# Patient Record
Sex: Female | Born: 1945 | Race: White | Hispanic: No | Smoking: Former smoker
Health system: Southern US, Community
[De-identification: ages and names within clinical notes are randomized; demographics above are authoritative.]

## PROBLEM LIST (undated history)

## (undated) DIAGNOSIS — R351 Nocturia: Secondary | ICD-10-CM

## (undated) DIAGNOSIS — I83893 Varicose veins of bilateral lower extremities with other complications: Secondary | ICD-10-CM

## (undated) DIAGNOSIS — K5900 Constipation, unspecified: Secondary | ICD-10-CM

## (undated) DIAGNOSIS — Z860100 Personal history of colon polyps, unspecified: Secondary | ICD-10-CM

## (undated) DIAGNOSIS — J189 Pneumonia, unspecified organism: Secondary | ICD-10-CM

## (undated) DIAGNOSIS — E119 Type 2 diabetes mellitus without complications: Secondary | ICD-10-CM

## (undated) DIAGNOSIS — J310 Chronic rhinitis: Secondary | ICD-10-CM

## (undated) DIAGNOSIS — Z79890 Hormone replacement therapy (postmenopausal): Secondary | ICD-10-CM

## (undated) DIAGNOSIS — C651 Malignant neoplasm of right renal pelvis: Secondary | ICD-10-CM

## (undated) DIAGNOSIS — D6481 Anemia due to antineoplastic chemotherapy: Secondary | ICD-10-CM

## (undated) DIAGNOSIS — D62 Acute posthemorrhagic anemia: Secondary | ICD-10-CM

## (undated) DIAGNOSIS — Z9889 Other specified postprocedural states: Secondary | ICD-10-CM

## (undated) DIAGNOSIS — M2550 Pain in unspecified joint: Secondary | ICD-10-CM

## (undated) DIAGNOSIS — R600 Localized edema: Secondary | ICD-10-CM

## (undated) DIAGNOSIS — M169 Osteoarthritis of hip, unspecified: Secondary | ICD-10-CM

## (undated) DIAGNOSIS — R0600 Dyspnea, unspecified: Secondary | ICD-10-CM

## (undated) DIAGNOSIS — D72829 Elevated white blood cell count, unspecified: Secondary | ICD-10-CM

## (undated) DIAGNOSIS — R3915 Urgency of urination: Secondary | ICD-10-CM

## (undated) DIAGNOSIS — C801 Malignant (primary) neoplasm, unspecified: Secondary | ICD-10-CM

## (undated) DIAGNOSIS — Z85828 Personal history of other malignant neoplasm of skin: Secondary | ICD-10-CM

## (undated) DIAGNOSIS — R531 Weakness: Secondary | ICD-10-CM

## (undated) DIAGNOSIS — K219 Gastro-esophageal reflux disease without esophagitis: Secondary | ICD-10-CM

## (undated) DIAGNOSIS — E785 Hyperlipidemia, unspecified: Secondary | ICD-10-CM

## (undated) DIAGNOSIS — M549 Dorsalgia, unspecified: Secondary | ICD-10-CM

## (undated) DIAGNOSIS — T8859XA Other complications of anesthesia, initial encounter: Secondary | ICD-10-CM

## (undated) DIAGNOSIS — Z972 Presence of dental prosthetic device (complete) (partial): Secondary | ICD-10-CM

## (undated) DIAGNOSIS — Z9221 Personal history of antineoplastic chemotherapy: Secondary | ICD-10-CM

## (undated) DIAGNOSIS — E039 Hypothyroidism, unspecified: Secondary | ICD-10-CM

## (undated) DIAGNOSIS — Z95828 Presence of other vascular implants and grafts: Secondary | ICD-10-CM

## (undated) DIAGNOSIS — M791 Myalgia, unspecified site: Secondary | ICD-10-CM

## (undated) DIAGNOSIS — T451X5A Adverse effect of antineoplastic and immunosuppressive drugs, initial encounter: Secondary | ICD-10-CM

## (undated) DIAGNOSIS — Z91018 Allergy to other foods: Secondary | ICD-10-CM

## (undated) DIAGNOSIS — I10 Essential (primary) hypertension: Secondary | ICD-10-CM

## (undated) DIAGNOSIS — T466X5A Adverse effect of antihyperlipidemic and antiarteriosclerotic drugs, initial encounter: Secondary | ICD-10-CM

## (undated) DIAGNOSIS — K635 Polyp of colon: Secondary | ICD-10-CM

## (undated) DIAGNOSIS — R7303 Prediabetes: Secondary | ICD-10-CM

## (undated) DIAGNOSIS — R1114 Bilious vomiting: Secondary | ICD-10-CM

## (undated) DIAGNOSIS — Z923 Personal history of irradiation: Secondary | ICD-10-CM

## (undated) DIAGNOSIS — M1990 Unspecified osteoarthritis, unspecified site: Secondary | ICD-10-CM

## (undated) DIAGNOSIS — C3411 Malignant neoplasm of upper lobe, right bronchus or lung: Secondary | ICD-10-CM

## (undated) DIAGNOSIS — R112 Nausea with vomiting, unspecified: Secondary | ICD-10-CM

## (undated) HISTORY — PX: COLONOSCOPY: SHX174

## (undated) HISTORY — PX: UMBILICAL HERNIA REPAIR: SHX196

## (undated) HISTORY — PX: HERNIA REPAIR: SHX51

## (undated) HISTORY — DX: Polyp of colon: K63.5

## (undated) HISTORY — DX: Bilious vomiting: R11.14

## (undated) HISTORY — DX: Localized edema: R60.0

## (undated) HISTORY — DX: Hyperlipidemia, unspecified: E78.5

## (undated) HISTORY — DX: Adverse effect of antihyperlipidemic and antiarteriosclerotic drugs, initial encounter: T46.6X5A

## (undated) HISTORY — DX: Myalgia, unspecified site: M79.10

## (undated) HISTORY — DX: Osteoarthritis of hip, unspecified: M16.9

## (undated) HISTORY — DX: Elevated white blood cell count, unspecified: D72.829

## (undated) HISTORY — DX: Dorsalgia, unspecified: M54.9

## (undated) HISTORY — DX: Allergy to other foods: Z91.018

## (undated) HISTORY — DX: Pain in unspecified joint: M25.50

## (undated) HISTORY — PX: THYROID LOBECTOMY: SHX420

## (undated) HISTORY — DX: Constipation, unspecified: K59.00

## (undated) HISTORY — DX: Prediabetes: R73.03

## (undated) HISTORY — DX: Acute posthemorrhagic anemia: D62

## (undated) HISTORY — DX: Hormone replacement therapy: Z79.890

---

## 1977-08-23 DIAGNOSIS — E890 Postprocedural hypothyroidism: Secondary | ICD-10-CM

## 1977-08-23 HISTORY — DX: Postprocedural hypothyroidism: E89.0

## 1977-08-23 HISTORY — PX: THYROID LOBECTOMY: SHX420

## 1985-08-23 HISTORY — PX: EYE SURGERY: SHX253

## 1999-03-11 ENCOUNTER — Other Ambulatory Visit: Admission: RE | Admit: 1999-03-11 | Discharge: 1999-03-11 | Payer: Self-pay | Admitting: Family Medicine

## 2000-05-19 ENCOUNTER — Encounter: Payer: Self-pay | Admitting: Family Medicine

## 2000-05-19 ENCOUNTER — Encounter: Admission: RE | Admit: 2000-05-19 | Discharge: 2000-05-19 | Payer: Self-pay | Admitting: Family Medicine

## 2000-05-26 ENCOUNTER — Other Ambulatory Visit: Admission: RE | Admit: 2000-05-26 | Discharge: 2000-05-26 | Payer: Self-pay | Admitting: Family Medicine

## 2001-12-28 ENCOUNTER — Other Ambulatory Visit: Admission: RE | Admit: 2001-12-28 | Discharge: 2001-12-28 | Payer: Self-pay | Admitting: Family Medicine

## 2003-01-02 ENCOUNTER — Encounter: Payer: Self-pay | Admitting: Family Medicine

## 2003-01-02 ENCOUNTER — Encounter: Admission: RE | Admit: 2003-01-02 | Discharge: 2003-01-02 | Payer: Self-pay | Admitting: Family Medicine

## 2003-01-09 ENCOUNTER — Other Ambulatory Visit: Admission: RE | Admit: 2003-01-09 | Discharge: 2003-01-09 | Payer: Self-pay | Admitting: Family Medicine

## 2004-01-08 ENCOUNTER — Encounter: Admission: RE | Admit: 2004-01-08 | Discharge: 2004-01-08 | Payer: Self-pay | Admitting: Family Medicine

## 2004-01-17 ENCOUNTER — Other Ambulatory Visit: Admission: RE | Admit: 2004-01-17 | Discharge: 2004-01-17 | Payer: Self-pay | Admitting: Family Medicine

## 2004-01-27 ENCOUNTER — Encounter: Admission: RE | Admit: 2004-01-27 | Discharge: 2004-01-27 | Payer: Self-pay | Admitting: Family Medicine

## 2005-12-03 ENCOUNTER — Encounter: Admission: RE | Admit: 2005-12-03 | Discharge: 2005-12-03 | Payer: Self-pay | Admitting: Family Medicine

## 2005-12-22 ENCOUNTER — Other Ambulatory Visit: Admission: RE | Admit: 2005-12-22 | Discharge: 2005-12-22 | Payer: Self-pay | Admitting: Family Medicine

## 2006-08-01 ENCOUNTER — Encounter: Admission: RE | Admit: 2006-08-01 | Discharge: 2006-10-30 | Payer: Self-pay | Admitting: Family Medicine

## 2007-03-02 ENCOUNTER — Encounter: Admission: RE | Admit: 2007-03-02 | Discharge: 2007-03-02 | Payer: Self-pay | Admitting: Family Medicine

## 2008-03-04 ENCOUNTER — Encounter: Admission: RE | Admit: 2008-03-04 | Discharge: 2008-03-04 | Payer: Self-pay | Admitting: Family Medicine

## 2008-04-17 ENCOUNTER — Encounter: Admission: RE | Admit: 2008-04-17 | Discharge: 2008-04-17 | Payer: Self-pay | Admitting: Family Medicine

## 2008-05-22 ENCOUNTER — Ambulatory Visit: Payer: Self-pay | Admitting: Vascular Surgery

## 2008-12-28 LAB — HM HEPATITIS C SCREENING LAB: HM Hepatitis Screen: NEGATIVE

## 2009-04-22 ENCOUNTER — Encounter: Admission: RE | Admit: 2009-04-22 | Discharge: 2009-04-22 | Payer: Self-pay | Admitting: Family Medicine

## 2009-05-28 ENCOUNTER — Encounter: Admission: RE | Admit: 2009-05-28 | Discharge: 2009-05-28 | Payer: Self-pay | Admitting: Family Medicine

## 2009-12-16 ENCOUNTER — Telehealth: Payer: Self-pay | Admitting: Gastroenterology

## 2010-06-22 ENCOUNTER — Encounter: Admission: RE | Admit: 2010-06-22 | Discharge: 2010-06-22 | Payer: Self-pay | Admitting: Family Medicine

## 2010-09-13 ENCOUNTER — Encounter: Payer: Self-pay | Admitting: Family Medicine

## 2010-09-22 NOTE — Progress Notes (Signed)
Summary: Schedule recall colonoscopy  Phone Note Outgoing Call Call back at Home Phone [phone]   Call placed by: Christie Nottingham CMA Duncan Dull),  December 16, 2009 11:07 AM Call placed to: Patient Summary of Call: Called pt to schedule recall colonoscopy. Pt states she has the letter on her frig and has been meaning to call and schedule the colonoscopy. She states when everything calms down she will call back and schedule in the fall time. Informed pt of the importance of having recall colonoscopy due to her family history of colon cancer and her prior colon polyps. Pt agreed. Initial call taken by: Christie Nottingham CMA Duncan Dull),  December 16, 2009 11:10 AM

## 2010-11-11 ENCOUNTER — Other Ambulatory Visit (HOSPITAL_COMMUNITY): Payer: Self-pay | Admitting: Obstetrics and Gynecology

## 2011-01-05 NOTE — Letter (Signed)
May 22, 2008   Talmadge Coventry, M.D.  [address]  [place], [place] [address]   Re:  KAWANA, HEGEL                DOB:  [birth year]   Dear Rise Mu:   Thanks for asking me to see this patient for evaluation of her lower  extremity venous pathology.  As you know, she is a very pleasant 65-year-  old white female with a long history of progressive venous varicosities.  These are much more severe on her left leg than on her right.  She does  not have any history of deep vein thrombosis and no history of bleeding.  She does have progressive changes of aching and itchiness over her  medial left calf and also the sensation of heaviness.  She did try  graduated compression stockings several years ago, but has not tried  these recently.  She does have a maternal history of venous  varicosities.  She does have slightly elevated blood sugar but is not  diabetic and is on no treatment other than diet.  She also has a strong  family history of premature atherosclerotic disease with her father  suffering amputation secondary to vascular disease.   PAST MEDICAL HISTORY:  Significant only for elevated cholesterol.   SOCIAL HISTORY:  She is married.  She does smoke 1/2 pack of cigarettes  per day.  She does not drink alcohol on a regular basis.   REVIEW OF SYSTEMS:  Her weight is reported at 190 pounds.  She is 5 feet  6 inches tall.   MEDICATIONS:  Hydrochlorothiazide, Premarin, Synthroid, simvastatin, and  aspirin.   PHYSICAL EXAM:  Well-developed, well-nourished white female appearing  stated age of 65.  Blood pressure is 147/79, pulse 75, respirations 18.  Dorsalis pedis pulses are 2+ bilaterally.  She does have scattered  spider vein telangiectasia over her ankles bilaterally.  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  These are to a  much lesser degree on her right calf.   She underwent screening venous duplex by me, and this shows gross  reflux  in the left great saphenous vein feeding into these tributary  varicosities.  She does have a slightly dilated right saphenous vein and  I am not able to demonstrate this level of reflux on the right leg.  I  discussed options with the patient.  I explained that this should not  put her at any increased risk for deep vein thrombosis and explained  that this would not cause her any increased risk for limb loss.  I  explained that with her normal pedal pulses, that she does not  demonstrate any evidence of arterial insufficiency.  I have explained  that we hopefully can achieve improvement in her symptoms with  compression garments.  We have fitted her with thigh-high 20-30 mmHg  pressure garments and instructed her on the use of these.  She will  continue her ibuprofen 600 mg q.4 to 6 hours as needed for discomfort  and continue with her elevation.  She is currently in a weight loss  program and has lost 33 pounds to date.  We will see her again in 3  months for repeat evaluation to determine if her conservative therapy is  helping her.  I did discuss options of laser ablation and stab  phlebectomy for relief of her symptoms should the conservative methods  fail.  We will see  her again in 3 months and will proceed with a formal  venous duplex at that time as well.   Larina Earthly, M.D.  Electronically Signed   TFE/MEDQ  D:  05/22/2008  T:  05/23/2008  Job:  [id number]

## 2011-05-17 ENCOUNTER — Other Ambulatory Visit: Payer: Self-pay | Admitting: Obstetrics and Gynecology

## 2011-05-17 DIAGNOSIS — Z1231 Encounter for screening mammogram for malignant neoplasm of breast: Secondary | ICD-10-CM

## 2011-06-04 ENCOUNTER — Ambulatory Visit
Admission: RE | Admit: 2011-06-04 | Discharge: 2011-06-04 | Disposition: A | Discharge status: Home / Self Care | Payer: Federal, State, Local not specified - PPO | Source: Ambulatory Visit | Attending: Obstetrics and Gynecology | Admitting: Obstetrics and Gynecology

## 2011-06-04 DIAGNOSIS — Z1231 Encounter for screening mammogram for malignant neoplasm of breast: Secondary | ICD-10-CM

## 2014-06-05 ENCOUNTER — Telehealth: Payer: Self-pay | Admitting: Cardiovascular Disease

## 2014-06-05 NOTE — Telephone Encounter (Signed)
05/31/14 Received 7 pages of records from [place] for appointment with Dr Claiborne Billings 07/15/14.  Records give to [facility] in Medical Records for Dr Ermalinda Memos schedule of 07/15/14  lp

## 2014-07-15 ENCOUNTER — Ambulatory Visit: Payer: Federal, State, Local not specified - PPO | Admitting: Cardiovascular Disease

## 2014-07-23 ENCOUNTER — Ambulatory Visit: Payer: Federal, State, Local not specified - PPO | Admitting: Interventional Cardiology

## 2015-02-06 ENCOUNTER — Other Ambulatory Visit: Payer: Self-pay | Admitting: Oncology

## 2015-06-17 DIAGNOSIS — M159 Polyosteoarthritis, unspecified: Secondary | ICD-10-CM | POA: Insufficient documentation

## 2015-06-17 DIAGNOSIS — M47816 Spondylosis without myelopathy or radiculopathy, lumbar region: Secondary | ICD-10-CM | POA: Insufficient documentation

## 2015-06-17 DIAGNOSIS — K219 Gastro-esophageal reflux disease without esophagitis: Secondary | ICD-10-CM | POA: Insufficient documentation

## 2015-06-17 DIAGNOSIS — Z79890 Hormone replacement therapy (postmenopausal): Secondary | ICD-10-CM

## 2015-06-17 DIAGNOSIS — Z789 Other specified health status: Secondary | ICD-10-CM | POA: Insufficient documentation

## 2015-06-17 DIAGNOSIS — I10 Essential (primary) hypertension: Secondary | ICD-10-CM | POA: Insufficient documentation

## 2015-06-17 DIAGNOSIS — M8949 Other hypertrophic osteoarthropathy, multiple sites: Secondary | ICD-10-CM | POA: Insufficient documentation

## 2015-06-17 DIAGNOSIS — M150 Primary generalized (osteo)arthritis: Secondary | ICD-10-CM

## 2015-06-17 DIAGNOSIS — E782 Mixed hyperlipidemia: Secondary | ICD-10-CM | POA: Insufficient documentation

## 2015-06-17 DIAGNOSIS — E890 Postprocedural hypothyroidism: Secondary | ICD-10-CM | POA: Insufficient documentation

## 2015-06-17 HISTORY — DX: Hormone replacement therapy: Z79.890

## 2015-06-17 HISTORY — DX: Gastro-esophageal reflux disease without esophagitis: K21.9

## 2015-07-21 DIAGNOSIS — Z96641 Presence of right artificial hip joint: Secondary | ICD-10-CM | POA: Insufficient documentation

## 2015-07-28 DIAGNOSIS — R319 Hematuria, unspecified: Secondary | ICD-10-CM | POA: Insufficient documentation

## 2015-09-23 LAB — HEMOGLOBIN A1C: Hemoglobin A1C: 6.1

## 2015-10-24 ENCOUNTER — Other Ambulatory Visit: Payer: Self-pay

## 2015-10-24 DIAGNOSIS — Z1231 Encounter for screening mammogram for malignant neoplasm of breast: Secondary | ICD-10-CM

## 2015-10-27 ENCOUNTER — Encounter: Payer: Self-pay | Admitting: Gastroenterology

## 2015-11-11 ENCOUNTER — Encounter (HOSPITAL_COMMUNITY): Payer: Self-pay | Admitting: Emergency Medicine

## 2015-11-11 ENCOUNTER — Emergency Department (HOSPITAL_COMMUNITY): Payer: Medicare HMO

## 2015-11-11 ENCOUNTER — Emergency Department (HOSPITAL_COMMUNITY)
Admission: EM | Admit: 2015-11-11 | Discharge: 2015-11-11 | Disposition: A | Discharge status: Home / Self Care | Payer: Medicare HMO | Attending: Emergency Medicine | Admitting: Emergency Medicine

## 2015-11-11 DIAGNOSIS — F17210 Nicotine dependence, cigarettes, uncomplicated: Secondary | ICD-10-CM | POA: Insufficient documentation

## 2015-11-11 DIAGNOSIS — Z79899 Other long term (current) drug therapy: Secondary | ICD-10-CM | POA: Diagnosis not present

## 2015-11-11 DIAGNOSIS — N3001 Acute cystitis with hematuria: Secondary | ICD-10-CM

## 2015-11-11 DIAGNOSIS — D72829 Elevated white blood cell count, unspecified: Secondary | ICD-10-CM | POA: Diagnosis not present

## 2015-11-11 DIAGNOSIS — E119 Type 2 diabetes mellitus without complications: Secondary | ICD-10-CM | POA: Diagnosis not present

## 2015-11-11 DIAGNOSIS — E039 Hypothyroidism, unspecified: Secondary | ICD-10-CM | POA: Diagnosis not present

## 2015-11-11 DIAGNOSIS — Z7982 Long term (current) use of aspirin: Secondary | ICD-10-CM | POA: Diagnosis not present

## 2015-11-11 DIAGNOSIS — R946 Abnormal results of thyroid function studies: Secondary | ICD-10-CM | POA: Diagnosis not present

## 2015-11-11 DIAGNOSIS — R55 Syncope and collapse: Secondary | ICD-10-CM | POA: Diagnosis not present

## 2015-11-11 DIAGNOSIS — Z91040 Latex allergy status: Secondary | ICD-10-CM | POA: Insufficient documentation

## 2015-11-11 DIAGNOSIS — R7989 Other specified abnormal findings of blood chemistry: Secondary | ICD-10-CM

## 2015-11-11 DIAGNOSIS — R42 Dizziness and giddiness: Secondary | ICD-10-CM | POA: Insufficient documentation

## 2015-11-11 DIAGNOSIS — R531 Weakness: Secondary | ICD-10-CM | POA: Insufficient documentation

## 2015-11-11 DIAGNOSIS — I10 Essential (primary) hypertension: Secondary | ICD-10-CM | POA: Diagnosis not present

## 2015-11-11 HISTORY — DX: Hypothyroidism, unspecified: E03.9

## 2015-11-11 HISTORY — DX: Essential (primary) hypertension: I10

## 2015-11-11 LAB — I-STAT TROPONIN, ED
Troponin i, poc: 0 ng/mL (ref 0.00–0.08)
Troponin i, poc: 0 ng/mL (ref 0.00–0.08)

## 2015-11-11 LAB — URINALYSIS, ROUTINE W REFLEX MICROSCOPIC
Bilirubin Urine: NEGATIVE
Glucose, UA: NEGATIVE mg/dL
Ketones, ur: NEGATIVE mg/dL
Nitrite: NEGATIVE
Protein, ur: NEGATIVE mg/dL
Specific Gravity, Urine: 1.018 (ref 1.005–1.030)
pH: 6.5 (ref 5.0–8.0)

## 2015-11-11 LAB — COMPREHENSIVE METABOLIC PANEL
ALT: 22 U/L (ref 14–54)
AST: 20 U/L (ref 15–41)
Albumin: 3.6 g/dL (ref 3.5–5.0)
Alkaline Phosphatase: 71 U/L (ref 38–126)
Anion gap: 12 (ref 5–15)
BUN: 28 mg/dL — ABNORMAL HIGH (ref 6–20)
CO2: 28 mmol/L (ref 22–32)
Calcium: 9.5 mg/dL (ref 8.9–10.3)
Chloride: 102 mmol/L (ref 101–111)
Creatinine, Ser: 1.52 mg/dL — ABNORMAL HIGH (ref 0.44–1.00)
GFR calc Af Amer: 40 mL/min — ABNORMAL LOW (ref >60)
GFR calc non Af Amer: 34 mL/min — ABNORMAL LOW (ref >60)
Glucose, Bld: 105 mg/dL — ABNORMAL HIGH (ref 65–99)
Potassium: 4 mmol/L (ref 3.5–5.1)
Sodium: 142 mmol/L (ref 135–145)
Total Bilirubin: 0.7 mg/dL (ref 0.3–1.2)
Total Protein: 6.6 g/dL (ref 6.5–8.1)

## 2015-11-11 LAB — CBC WITH DIFFERENTIAL/PLATELET
Basophils Absolute: 0 10*3/uL (ref 0.0–0.1)
Basophils Relative: 0 %
Eosinophils Absolute: 0.1 10*3/uL (ref 0.0–0.7)
Eosinophils Relative: 1 %
HCT: 38.9 % (ref 36.0–46.0)
Hemoglobin: 12.7 g/dL (ref 12.0–15.0)
Lymphocytes Relative: 12 %
Lymphs Abs: 1.6 10*3/uL (ref 0.7–4.0)
MCH: 31.1 pg (ref 26.0–34.0)
MCHC: 32.6 g/dL (ref 30.0–36.0)
MCV: 95.1 fL (ref 78.0–100.0)
Monocytes Absolute: 0.9 10*3/uL (ref 0.1–1.0)
Monocytes Relative: 6 %
Neutro Abs: 11 10*3/uL — ABNORMAL HIGH (ref 1.7–7.7)
Neutrophils Relative %: 81 %
Platelets: 235 10*3/uL (ref 150–400)
RBC: 4.09 MIL/uL (ref 3.87–5.11)
RDW: 13 % (ref 11.5–15.5)
WBC: 13.6 10*3/uL — ABNORMAL HIGH (ref 4.0–10.5)

## 2015-11-11 LAB — URINE MICROSCOPIC-ADD ON

## 2015-11-11 LAB — TSH: TSH: 0.215 u[IU]/mL — ABNORMAL LOW (ref 0.350–4.500)

## 2015-11-11 LAB — T4, FREE: Free T4: 1.23 ng/dL — ABNORMAL HIGH (ref 0.61–1.12)

## 2015-11-11 LAB — PROTIME-INR
INR: 0.97 (ref 0.00–1.49)
Prothrombin Time: 13.1 seconds (ref 11.6–15.2)

## 2015-11-11 LAB — I-STAT CG4 LACTIC ACID, ED: Lactic Acid, Venous: 1.11 mmol/L (ref 0.5–2.0)

## 2015-11-11 LAB — APTT: aPTT: 24 seconds (ref 24–37)

## 2015-11-11 MED ORDER — CEPHALEXIN 500 MG PO CAPS: 500.0000 mg | ORAL_CAPSULE | Freq: Four times a day (QID) | ORAL | Status: DC

## 2015-11-11 MED ORDER — SODIUM CHLORIDE 0.9 % IV BOLUS (SEPSIS)
1000.0000 mL | Freq: Once | INTRAVENOUS | Status: AC
  Administered 2015-11-11: 1000 mL via INTRAVENOUS

## 2015-11-11 MED ORDER — PROMETHAZINE HCL 25 MG/ML IJ SOLN
12.5000 mg | Freq: Once | INTRAMUSCULAR | Status: AC
  Administered 2015-11-11: 12.5 mg via INTRAVENOUS
  Filled 2015-11-11 (×2): qty 1

## 2015-11-11 MED ORDER — ONDANSETRON HCL 4 MG/2ML IJ SOLN
4.0000 mg | Freq: Once | INTRAMUSCULAR | Status: AC
  Administered 2015-11-11: 4 mg via INTRAVENOUS
  Filled 2015-11-11: qty 2

## 2015-11-11 NOTE — ED Notes (Signed)
Pt able to ambulate to the bathroom w/ minimal assistance.  Informed provider

## 2015-11-11 NOTE — ED Provider Notes (Signed)
Maria Marquez is a 70 y.o. female, with a history of HTN and hypothyroidism, presenting to the ED with a syncopal episode. Patient currently complains of lightheadedness and nausea. Patient previously had a headache that has since resolved. Patient denies pain. Patient denies recent illness, fever/chills, vomiting, chest pain, shortness of breath, abdominal pain, or any other complaints. Patient is accompanied by her daughter and her daughter's spouse at the bedside.  HPI from Eliezer Mccoy, PA-C: "Patient is a 70yo F with a PMHx of HTN and hypothyroidism who presents today with a syncopal episode. The episode was not witnessed. Patient remembers getting ready to go out this morning when she began feeling nauseous and weak. The patient got in the shower where she began feeling weaker and could barely dry off. Patient got out and continued to feel nauseous. Patient sat on bed to begin getting dressed and the next she remembers is lying on her carpet. Patient believes she slid down her bed, and does not think she hit her head. Denies any headache or contusion. Patient Reports she was dripping in sweat following the episode and called her daughter. The daughter reports she had slurred speech on the phone, which as resolved. She continues to experience light-headedness and weakness now. Patient described a bad taste in her mouth now like she was "eating from a dog bowl." Patient endorses good health leading up to this except one episode of transient vision loss 2 months ago. The patient described it as "white gauze laid across eyes" with light penetration that lasted 2-5 minutes. Patient's most recent TSH was in January and Synthroid was slightly increased. Patient has only eaten some cheese today."  Physical Exam  BP 129/62 mmHg  Pulse 75  Temp(Src) 97.8 F (36.6 C) (Oral)  Resp 19  Ht '5\' 6"'$  (1.676 m)  Wt 104.327 kg  BMI 37.14 kg/m2  SpO2 96%  Physical Exam  Constitutional: She is oriented to person,  place, and time. She appears well-developed and well-nourished. No distress.  HENT:  Head: Normocephalic and atraumatic.  Mouth/Throat: Oropharynx is clear and moist.  Eyes: Conjunctivae and EOM are normal. Pupils are equal, round, and reactive to light.  Neck: Neck supple.  Cardiovascular: Normal rate, regular rhythm, normal heart sounds and intact distal pulses.   Pulmonary/Chest: Effort normal and breath sounds normal. No respiratory distress.  Abdominal: Soft. Bowel sounds are normal. There is no tenderness. There is no guarding.  Musculoskeletal: She exhibits no edema or tenderness.  Full ROM in all extremities and spine. No paraspinal tenderness.   Lymphadenopathy:    She has no cervical adenopathy.  Neurological: She is alert and oriented to person, place, and time. She has normal reflexes.  No sensory deficits. Strength 5/5 in all extremities. No gait disturbance. Coordination intact. Cranial nerves III-XII grossly intact. No facial droop.   Skin: Skin is warm and dry. She is not diaphoretic.  Psychiatric: She has a normal mood and affect. Her behavior is normal.  Nursing note and vitals reviewed.   ED Course  Procedures  Results for orders placed or performed during the hospital encounter of 11/11/15  CBC WITH DIFFERENTIAL  Result Value Ref Range   WBC 13.6 (H) 4.0 - 10.5 K/uL   RBC 4.09 3.87 - 5.11 MIL/uL   Hemoglobin 12.7 12.0 - 15.0 g/dL   HCT 38.9 36.0 - 46.0 %   MCV 95.1 78.0 - 100.0 fL   MCH 31.1 26.0 - 34.0 pg   MCHC 32.6 30.0 - 36.0 g/dL  RDW 13.0 11.5 - 15.5 %   Platelets 235 150 - 400 K/uL   Neutrophils Relative % 81 %   Neutro Abs 11.0 (H) 1.7 - 7.7 K/uL   Lymphocytes Relative 12 %   Lymphs Abs 1.6 0.7 - 4.0 K/uL   Monocytes Relative 6 %   Monocytes Absolute 0.9 0.1 - 1.0 K/uL   Eosinophils Relative 1 %   Eosinophils Absolute 0.1 0.0 - 0.7 K/uL   Basophils Relative 0 %   Basophils Absolute 0.0 0.0 - 0.1 K/uL  Comprehensive metabolic panel  Result  Value Ref Range   Sodium 142 135 - 145 mmol/L   Potassium 4.0 3.5 - 5.1 mmol/L   Chloride 102 101 - 111 mmol/L   CO2 28 22 - 32 mmol/L   Glucose, Bld 105 (H) 65 - 99 mg/dL   BUN 28 (H) 6 - 20 mg/dL   Creatinine, Ser 1.52 (H) 0.44 - 1.00 mg/dL   Calcium 9.5 8.9 - 10.3 mg/dL   Total Protein 6.6 6.5 - 8.1 g/dL   Albumin 3.6 3.5 - 5.0 g/dL   AST 20 15 - 41 U/L   ALT 22 14 - 54 U/L   Alkaline Phosphatase 71 38 - 126 U/L   Total Bilirubin 0.7 0.3 - 1.2 mg/dL   GFR calc non Af Amer 34 (L) >60 mL/min   GFR calc Af Amer 40 (L) >60 mL/min   Anion gap 12 5 - 15  Protime-INR  Result Value Ref Range   Prothrombin Time 13.1 11.6 - 15.2 seconds   INR 0.97 0.00 - 1.49  Urinalysis, Routine w reflex microscopic (not at [facility])  Result Value Ref Range   Color, Urine YELLOW YELLOW   APPearance CLOUDY (A) CLEAR   Specific Gravity, Urine 1.018 1.005 - 1.030   pH 6.5 5.0 - 8.0   Glucose, UA NEGATIVE NEGATIVE mg/dL   Hgb urine dipstick MODERATE (A) NEGATIVE   Bilirubin Urine NEGATIVE NEGATIVE   Ketones, ur NEGATIVE NEGATIVE mg/dL   Protein, ur NEGATIVE NEGATIVE mg/dL   Nitrite NEGATIVE NEGATIVE   Leukocytes, UA SMALL (A) NEGATIVE  APTT  Result Value Ref Range   aPTT 24 24 - 37 seconds  Urine microscopic-add on  Result Value Ref Range   Squamous Epithelial / LPF 6-30 (A) NONE SEEN   WBC, UA 0-5 0 - 5 WBC/hpf   RBC / HPF 0-5 0 - 5 RBC/hpf   Bacteria, UA FEW (A) NONE SEEN  TSH  Result Value Ref Range   TSH 0.215 (L) 0.350 - 4.500 uIU/mL  T4, free  Result Value Ref Range   Free T4 1.23 (H) 0.61 - 1.12 ng/dL  I-Stat Troponin, ED (not at [facility])  Result Value Ref Range   Troponin i, poc 0.00 0.00 - 0.08 ng/mL   Comment 3          I-Stat CG4 Lactic Acid, ED  Result Value Ref Range   Lactic Acid, Venous 1.11 0.5 - 2.0 mmol/L   Dg Chest 2 View  11/11/2015  CLINICAL DATA:  Syncope. Elevated white blood cell count. Symptoms for 1 day. EXAM: CHEST  2 VIEW COMPARISON:  None. FINDINGS: Normal heart  size. Lungs clear. No pneumothorax. No pleural effusion. IMPRESSION: No active cardiopulmonary disease. Electronically Signed   By: Marybelle Killings M.D.   On: 11/11/2015 17:18   Ct Head Wo Contrast  11/11/2015  CLINICAL DATA:  Fall loss of consciousness in the shower this morning, denies hitting head EXAM: CT HEAD  WITHOUT CONTRAST TECHNIQUE: Contiguous axial images were obtained from the base of the skull through the vertex without intravenous contrast. COMPARISON:  None. FINDINGS: Mild diffuse supratentorial atrophy. Mildly more prominent cerebellar atrophy. No evidence of mass or vascular territory infarct. No parenchymal hemorrhage or extra-axial fluid. No hydrocephalus. Calvarium is intact. IMPRESSION: Atrophy involving supratentorial and infratentorial compartments. No acute traumatic injury. Electronically Signed   By: Skipper Cliche M.D.   On: 11/11/2015 15:48   Orthostatic VS for the past 24 hrs:  BP- Lying Pulse- Lying BP- Sitting Pulse- Sitting BP- Standing at 0 minutes Pulse- Standing at 0 minutes  11/11/15 1512 (!) 112/93 mmHg 63 133/81 mmHg 67 128/70 mmHg 72    EKG Interpretation  Date/Time:  Tuesday November 11 2015 13:48:32 EDT Ventricular Rate:  62 PR Interval:  157 QRS Duration: 101 QT Interval:  415 QTC Calculation: 421 R Axis:   47 Text Interpretation:  Sinus rhythm No previous tracing Confirmed by KNOTT MD, DANIEL ([id number]) on 11/11/2015 3:03:45 PM      Medications  sodium chloride 0.9 % bolus 1,000 mL (0 mLs Intravenous Stopped 11/11/15 1536)  ondansetron (ZOFRAN) injection 4 mg (4 mg Intravenous Given 11/11/15 1435)  promethazine (PHENERGAN) injection 12.5 mg (12.5 mg Intravenous Given 11/11/15 1648)  sodium chloride 0.9 % bolus 1,000 mL (0 mLs Intravenous Stopped 11/11/15 1904)      MDM 4:03 PM Took patient care handoff report from Eliezer Mccoy, PA-C.  Plan: Await UA, lactate, and CXR to evaluated leukocytosis. Discharge vs Observation admission.   Findings and plan of  care discussed with Leo Grosser, MD. Dr. Laneta Simmers personally evaluated and examined this patient.  Syncopal episode work up. Patient also has a mild leukocytosis at 13.6. Patient meets no other SIRS criteria. Patient is also noted to have an increase in creatinine 1.5. Patient states that she chronically has a higher than normal creatinine, but it usually tops out at 1.3.  IV fluids administered. Repeat physical exams and neuro checks revealed no changes. According to the [facility] Syncope Rule, pt is in a low risk group for serious outcome.  Low suspicion for ACS. HEART score is 3, indicating low risk for a cardiac event.  Up reevaluation, pt is still pain-free with no additional complaints. Nausea is controlled with phenergan.  Low TSH indicates that the patient may need to back off on her Synthroid for a couple days. Patient will definitely need to follow up with PCP on this matter. Patient's vital signs do not reflect symptomatic hyperthyroidism. Patient was able to ambulate around the department without difficulty or drop in SPO2. Possible UTI on UA. Patient to be treated for UTI. Patient states that she is ready to go home. Patient is shown no changes here in the ED. Patient was advised to follow-up with PCP as soon as possible this week. Patient voiced understanding of these instructions, accepts the plan, and is comfortable with discharge. Patient appears safe for discharge at this time.   Filed Vitals:   11/11/15 1745 11/11/15 1800 11/11/15 1815 11/11/15 1830  BP: 130/75 125/63 135/70 129/62  Pulse: 70 73 71 75  Temp:      TempSrc:      Resp: '20 12 20 19  '$ Height:      Weight:      SpO2: 100% 99% 96% 96%      Lorayne Bender, PA-C 11/11/15 2100  Lorayne Bender, PA-C 11/11/15 2106  Leo Grosser, MD 11/12/15 [phone]

## 2015-11-11 NOTE — ED Notes (Signed)
Pt had a syncopal episode, LOC for approx 10 min. Pt felt weak and nauseous prior to episode. Denies CP or any pain prior to and after episode. Pt received '4mg'$  zofran from EMS. Pt still feeling weak. Pt woke up on carpet. BP 128/74, HR 62, resp 22, CBG 128, spo2 98%

## 2015-11-11 NOTE — ED Notes (Signed)
Pt's dtr gave '324mg'$  ASA

## 2015-11-11 NOTE — Discharge Instructions (Signed)
You have been seen today for evaluation following a syncopal episode. Your urinalysis shows evidence of a possible UTI. It also appears that your TSH is too low, indicating that you may be getting too much of your thyroid medicine. Hold your daily dose of this medication for the next two days. No other abnormalities were found on your imaging or other labs. Please take all of your antibiotics until finished!   You may develop abdominal discomfort or diarrhea from the antibiotic.  You may help offset this with probiotics which you can buy or get in yogurt. Do not eat or take the probiotics until 2 hours after your antibiotic. Follow up with PCP as soon as possible for reevaluation this week. Return to ED should symptoms worsen.   RESOURCE GUIDE  Chronic Pain Problems: Contact [facility]  [phone] Patients need to be referred by their primary care doctor.  Insufficient Money for Medicine: Contact [employer]:  call "211" or [place] [phone].  No Primary Care Doctor: - Call [facility]  [phone] - can help you locate a primary care doctor that  accepts your insurance, provides certain services, etc. - Physician Referral Service- [phone]  Agencies that provide inexpensive medical care: - [facility]  [facility]  [phone] - [facility]  [phone] - [facility]  [phone] - Planned Parenthood  [phone] - [facility]  [phone]  [place] Providers: - [facility]- [address], [address]  [phone], Mon-Fri 9am-7pm, Sat 9am-1pm - [place], [address] [place]  [place], [address] [place]  [place]- [address]  [place], [address], [phone]  Only accepts [place]  patients after they have their name  applied to their card  Self Pay (no insurance) in [place]: - Sickle Cell Patients: Dr Kevan Ny, [facility]  [place], [facility]- [place]  [facility] [place]- [address], [facility]- see information above (Speak to [employer] if you do not have insurance)       -  [facility]- [place], [place],  [place], [place]  Dr Vista Lawman-  [address], [place], [place] [facility]- [address], [phone]       -  [facility] [place]- [address], [place], also [address] [phone]       -    [facility]- [address], [place], 1st & 3rd Saturday   every month, 10am-1pm  1) Find a Doctor and Pay Out of Pocket Although you won't have to find out who is covered by your insurance plan, it is a good idea to ask around and get recommendations. You will then need to call the office  and see if the doctor you have chosen will accept you as a new patient and what types of options they offer for patients who are self-pay. Some doctors offer discounts or will set up payment plans for their patients who do not have insurance, but you will need to ask so you aren't surprised when you get to your appointment.  2) Contact Your Local Health Department Not all health departments have doctors that can see patients for sick visits, but many do, so it is worth a call to see if yours does. If you don't know where your local health department is, you can check in your phone book. The CDC also has a tool to help you locate your state's health department, and many state websites also have listings of all of their local health  departments.  3) Find a [facility] [facility] If your illness is not likely to be very severe or complicated, you may want to try a walk in clinic. These are popping up all over the country in pharmacies, drugstores, and shopping centers. They're usually staffed by nurse practitioners or physician assistants that have been trained to treat common illnesses and complaints. They're usually fairly quick and inexpensive. However, if you have serious medical issues or chronic medical problems, these are probably not your best option  STD Testing - [place], [facility], [address] [place], phone [phone] or [phone].  Monday - Friday, call for an appointment. - [place], [facility], [address], [place], phone [phone] or [phone].  Monday - Friday, call for an appointment.  Abuse/Neglect: - [place] [phone] - [place] [phone] (After Hours)  Emergency Shelter:  [facility] [phone]  Maternity Homes: - Room at the [place] [phone] - [place] [phone]  MRSA Hotline #:   (559)172-1773  [facility] [place] Dept. [address] [place] Phone:  [phone]                                  Phone:  [phone]                   Phone:  [place], [place] in [place], [address]                                  [phone], Insurance  [place] [phone] or [phone] (After Hours)   [place]  Substance Abuse Resources: - Alcohol and Drug Services  [phone] - [place] [phone] - [facility] [place] [phone] Chinita Pester [phone] - Residential & Outpatient Substance Abuse Program  [phone]  Psychological Services: - [place]  [place], [place] [address] [place], [place]: [phone] or [phone], [employer]  Dental Assistance  If unable to pay or uninsured, contact:  [facility] or [facility] to become qualified for the adult dental clinic.  Patients with Medicaid: [facility] [phone] [address] [place], [place] [address] [phone]  If unable to pay, or uninsured, contact [facility] [phone])[phone]) or [place] [phone] in [place], [place] in [facility]) to become qualified for the adult dental clinic   Other [place]- [place], [place], [place], [address], [place], [place], 2nd and 4th Thursday of the month at 6:30am.  10 clients each day by appointment, can sometimes see walk-in patients if someone does not show for an appointment. [facility]- [address] Danker [place], [place], [address], [place], [place], [place], [address], [place] [facility]- [place] Department- [place] Department- [phone]

## 2015-11-11 NOTE — ED Provider Notes (Signed)
CSN: [id number]     Arrival date & time 11/11/15  1335 History   First MD Initiated Contact with Patient 11/11/15 1343     Chief Complaint  Patient presents with  . Loss of Consciousness     (Consider location/radiation/quality/duration/timing/severity/associated sxs/prior Treatment) HPI Comments: Patient is a 70yo F with a PMHx of HTN and hypothyroidism who presents today with a syncopal episode. The episode was not witnessed. Patient remembers getting ready to go out this morning when she began feeling nauseous and weak. The patient got in the shower where she began feeling weaker and could barely dry off. Patient got out and continued to feel nauseous. Patient sat on bed to begin getting dressed and the next she remembers is lying on her carpet. Patient believes she slid down her bed, and does not think she hit her head. Denies any headache or contusion. Patient  Reports she was dripping in sweat following the episode and called her daughter. The daughter reports she had slurred speech on the phone, which as resolved. She continues to experience light-headedness and weakness now. Patient described a bad taste in her mouth now like she was "eating from a dog bowl." Patient endorses good health leading up to this except one episode of transient vision loss 2 months ago. The patient described it as "white gauze laid across eyes" with light penetration that lasted 2-5 minutes. Patient's most recent TSH was in January and Synthroid was slightly increased. Patient has only eaten some cheese today.  Patient is a 70 y.o. female presenting with syncope. The history is provided by the patient and a relative.  Loss of Consciousness Associated symptoms: no chest pain, no fever, no headaches, no nausea, no shortness of breath and no vomiting     Past Medical History  Diagnosis Date  . Hypertension   . Diabetes mellitus without complication ([place])   . Hypothyroid    Past Surgical History  Procedure  Laterality Date  . Thyroid lobectomy      right side  . Hernia repair     History reviewed. No pertinent family history. Social History  Substance Use Topics  . Smoking status: Current Every Day Smoker -- 0.50 packs/day    Types: Cigarettes  . Smokeless tobacco: None  . Alcohol Use: Yes   OB History    No data available     Review of Systems  Constitutional: Negative for fever and chills.  HENT: Negative for facial swelling and sore throat.   Eyes: Negative for visual disturbance.  Respiratory: Negative for shortness of breath.   Cardiovascular: Positive for syncope. Negative for chest pain.  Gastrointestinal: Negative for nausea, vomiting and abdominal pain.  Genitourinary: Negative for dysuria.  Musculoskeletal: Negative for back pain.  Skin: Negative for rash and wound.  Neurological: Positive for light-headedness. Negative for headaches.  Psychiatric/Behavioral: The patient is not nervous/anxious.       Allergies  Latex and Sulfa antibiotics  Home Medications   Prior to Admission medications   Medication Sig Start Date End Date Taking? Authorizing Provider  aspirin 81 MG tablet Take 81 mg by mouth daily.   Yes Historical Provider, MD  Cyanocobalamin (VITAMIN B-12) 2500 MCG SUBL Place 2,500 mcg under the tongue daily.   Yes Historical Provider, MD  Estradiol-Norethindrone Acet 0.5-0.1 MG tablet Take 1 tablet by mouth daily. 10/16/15 10/15/16 Yes Historical Provider, MD  levothyroxine (SYNTHROID, LEVOTHROID) 137 MCG tablet Take 137 mcg by mouth daily. 10/27/15  Yes Historical Provider, MD  losartan-hydrochlorothiazide (  HYZAAR) 100-25 MG tablet Take 1 tablet by mouth daily.   Yes Historical Provider, MD  Multiple Vitamin (MULTIVITAMIN) tablet Take 1 tablet by mouth daily.   Yes Historical Provider, MD  rosuvastatin (CRESTOR) 10 MG tablet Take 10 mg by mouth at bedtime.   Yes Historical Provider, MD   BP 128/70 mmHg  Pulse 75  Temp(Src) 97.8 F (36.6 C) (Oral)  Resp 18   Ht '5\' 6"'$  (1.676 m)  Wt 104.327 kg  BMI 37.14 kg/m2  SpO2 99% Physical Exam  Constitutional: She appears well-developed and well-nourished. No distress.  HENT:  Head: Normocephalic and atraumatic.  Mouth/Throat: Oropharynx is clear and moist. No oropharyngeal exudate.  Eyes: Conjunctivae and EOM are normal. Pupils are equal, round, and reactive to light. Right eye exhibits no discharge. Left eye exhibits no discharge. No scleral icterus.  Neck: Normal range of motion. Neck supple. No thyromegaly present.  Cardiovascular: Normal rate, regular rhythm, normal heart sounds and intact distal pulses.  Exam reveals no gallop and no friction rub.   No murmur heard. Pulmonary/Chest: Effort normal and breath sounds normal. No stridor. No respiratory distress. She has no wheezes. She has no rales.  Abdominal: Soft. Bowel sounds are normal. She exhibits no distension. There is no tenderness. There is no rebound and no guarding.  Musculoskeletal: She exhibits no edema.  Lymphadenopathy:    She has no cervical adenopathy.  Neurological: She is alert. She displays no tremor. She exhibits normal muscle tone. Coordination normal.  CN 3-12 intact, normal sensation, 5/5 extremity strength  Skin: Skin is warm and dry. No rash noted. She is not diaphoretic. No pallor.  Psychiatric: She has a normal mood and affect.  Nursing note and vitals reviewed.   ED Course  Procedures (including critical care time) Labs Review Labs Reviewed  CBC WITH DIFFERENTIAL/PLATELET - Abnormal; Notable for the following:    WBC 13.6 (*)    Neutro Abs 11.0 (*)    All other components within normal limits  COMPREHENSIVE METABOLIC PANEL  PROTIME-INR  URINALYSIS, ROUTINE W REFLEX MICROSCOPIC (NOT AT [facility])  APTT  I-STAT TROPOININ, ED  POCT CBG (FASTING - GLUCOSE)-MANUAL ENTRY    Imaging Review No results found. I have personally reviewed and evaluated these images and lab results as part of my medical  decision-making.   EKG Interpretation   Date/Time:  Tuesday November 11 2015 13:48:32 EDT Ventricular Rate:  62 PR Interval:  157 QRS Duration: 101 QT Interval:  415 QTC Calculation: 421 R Axis:   47 Text Interpretation:  Sinus rhythm No previous tracing Confirmed by KNOTT  MD, Quillian Quince ([id number]) on 11/11/2015 3:03:45 PM        MDM   Patient had unwitnessed episode of syncope without trauma. Slurred speech at home following episode, resolved on arrival. Head CT shows atrophy involving supratentorial and infratentorial compartments. CBC shows elevated WBC (13.6). CMP elevated BUN, Cr. PT 13.1. aPTT pending. Troponin 0.00. EKG shows NSR. Refractory nausea after 2 doses of Zofran (including EMS dose). Promethazine ordered. At shift change, patient care transferred to [facility], PA-C for continued evaluation, follow up of CXR, Lactate, UA and determination of disposition.     Final diagnoses:  None      Frederica Kuster, PA-C 11/11/15 1620  Leo Grosser, MD 11/12/15 [phone]

## 2015-11-11 NOTE — ED Notes (Signed)
MD at the bedside  

## 2015-11-11 NOTE — ED Notes (Signed)
Pt ambulated to restroom with assistance pulse ox 100% on room air

## 2015-11-14 ENCOUNTER — Ambulatory Visit
Admission: RE | Admit: 2015-11-14 | Discharge: 2015-11-14 | Disposition: A | Discharge status: Home / Self Care | Payer: Medicare HMO | Source: Ambulatory Visit

## 2015-11-14 DIAGNOSIS — Z1231 Encounter for screening mammogram for malignant neoplasm of breast: Secondary | ICD-10-CM

## 2015-11-18 ENCOUNTER — Other Ambulatory Visit: Payer: Self-pay | Admitting: Family Medicine

## 2015-11-18 DIAGNOSIS — R928 Other abnormal and inconclusive findings on diagnostic imaging of breast: Secondary | ICD-10-CM

## 2015-11-26 ENCOUNTER — Ambulatory Visit
Admission: RE | Admit: 2015-11-26 | Discharge: 2015-11-26 | Disposition: A | Discharge status: Home / Self Care | Payer: Medicare HMO | Source: Ambulatory Visit | Attending: Family Medicine | Admitting: Family Medicine

## 2015-11-26 DIAGNOSIS — R928 Other abnormal and inconclusive findings on diagnostic imaging of breast: Secondary | ICD-10-CM

## 2015-12-08 ENCOUNTER — Encounter: Payer: Self-pay | Admitting: Gastroenterology

## 2015-12-09 ENCOUNTER — Ambulatory Visit: Payer: Medicare HMO

## 2015-12-09 VITALS — Ht 66.0 in | Wt 234.6 lb

## 2015-12-09 DIAGNOSIS — Z860100 Personal history of colon polyps, unspecified: Secondary | ICD-10-CM

## 2015-12-09 DIAGNOSIS — F17200 Nicotine dependence, unspecified, uncomplicated: Secondary | ICD-10-CM | POA: Insufficient documentation

## 2015-12-09 MED ORDER — SUPREP BOWEL PREP KIT 17.5-3.13-1.6 GM/177ML PO SOLN: 1.0000 | Freq: Once | ORAL | Status: DC

## 2015-12-09 NOTE — Progress Notes (Signed)
Eggs make her vomit No past problems with anesthesia No diet meds No home oxygen  Has email and internet; refused emmi

## 2015-12-23 ENCOUNTER — Encounter: Payer: Self-pay | Admitting: Gastroenterology

## 2015-12-23 ENCOUNTER — Ambulatory Visit (AMBULATORY_SURGERY_CENTER): Payer: Medicare HMO | Admitting: Gastroenterology

## 2015-12-23 VITALS — BP 99/62 | HR 63 | Temp 97.7°F | Resp 16 | Ht 66.0 in | Wt 234.0 lb

## 2015-12-23 DIAGNOSIS — Z1211 Encounter for screening for malignant neoplasm of colon: Secondary | ICD-10-CM | POA: Diagnosis not present

## 2015-12-23 DIAGNOSIS — Z86010 Personal history of colonic polyps: Secondary | ICD-10-CM | POA: Diagnosis not present

## 2015-12-23 DIAGNOSIS — K635 Polyp of colon: Secondary | ICD-10-CM | POA: Diagnosis not present

## 2015-12-23 DIAGNOSIS — D125 Benign neoplasm of sigmoid colon: Secondary | ICD-10-CM

## 2015-12-23 MED ORDER — SODIUM CHLORIDE 0.9 % IV SOLN: 500.0000 mL | INTRAVENOUS | Status: DC

## 2015-12-23 NOTE — Progress Notes (Signed)
Called to room to assist during endoscopic procedure.  Patient ID and intended procedure confirmed with present staff. Received instructions for my participation in the procedure from the performing physician.  

## 2015-12-23 NOTE — Progress Notes (Signed)
Report to PACU, RN, vss, BBS= Clear.  

## 2015-12-23 NOTE — Op Note (Signed)
[place] Patient Name: Maria Marquez Procedure Date: 12/23/2015 9:12 AM MRN: [id number] Endoscopist: [name] , MD Age: 70 Date of Birth: June 30, 1946 Gender: Female Procedure:                Colonoscopy Indications:              Surveillance: Personal history of adenomatous                            polyps on last colonoscopy > 5 years ago Medicines:                Monitored Anesthesia Care Procedure:                Pre-Anesthesia Assessment:                           - Prior to the procedure, a History and Physical                            was performed, and patient medications and                            allergies were reviewed. The patient's tolerance of                            previous anesthesia was also reviewed. The risks                            and benefits of the procedure and the sedation                            options and risks were discussed with the patient.                            All questions were answered, and informed consent                            was obtained. Prior Anticoagulants: The patient has                            taken no previous anticoagulant or antiplatelet                            agents. ASA Grade Assessment: II - A patient with                            mild systemic disease. After reviewing the risks                            and benefits, the patient was deemed in                            satisfactory condition to undergo the procedure.  After obtaining informed consent, the colonoscope                            was passed under direct vision. Throughout the                            procedure, the patient's blood pressure, pulse, and                            oxygen saturations were monitored continuously. The                            Model PCF-[id number]) scope was introduced                            through the anus and advanced to the the cecum,                  identified by appendiceal orifice and ileocecal                            valve. The colonoscopy was somewhat difficult due                            to a tortuous colon. Successful completion of the                            procedure was aided by changing the patient to a                            supine position. The patient tolerated the                            procedure well. The quality of the bowel                            preparation was good. The ileocecal valve,                            appendiceal orifice, and rectum were photographed. Scope In: 9:21:21 AM Scope Out: 9:42:06 AM Scope Withdrawal Time: 0 hours 12 minutes 40 seconds  Total Procedure Duration: 0 hours 20 minutes 45 seconds  Findings:                 The digital rectal exam was normal.                           Two sessile polyps were found in the sigmoid colon.                            The polyps were 4 to 5 mm in size. These polyps                            were removed with a cold biopsy forceps. Resection  and retrieval were complete.                           Internal hemorrhoids were found during                            retroflexion. The hemorrhoids were small and Grade                            I (internal hemorrhoids that do not prolapse).                           The exam was otherwise normal throughout the                            examined colon. Complications:            No immediate complications. Estimated Blood Loss:     Estimated blood loss: none. Impression:               - Two 4 to 5 mm polyps in the sigmoid colon,                            removed with a cold biopsy forceps. Resected and                            retrieved.                           - Internal hemorrhoids. Recommendation:           - Patient has a contact number available for                            emergencies. The signs and symptoms of potential                             delayed complications were discussed with the                            patient. Return to normal activities tomorrow.                            Written discharge instructions were provided to the                            patient.                           - Resume previous diet.                           - Continue present medications.                           - Await pathology results.                           -  Repeat colonoscopy in 5 years for surveillance. [name], MD 12/23/2015 9:51:29 AM This report has been signed electronically.

## 2015-12-23 NOTE — Patient Instructions (Signed)
YOU HAD AN ENDOSCOPIC PROCEDURE TODAY AT [facility]:   Refer to the procedure report that was given to you for any specific questions about what was found during the examination.  If the procedure report does not answer your questions, please call your gastroenterologist to clarify.  If you requested that your care partner not be given the details of your procedure findings, then the procedure report has been included in a sealed envelope for you to review at your convenience later.  YOU SHOULD EXPECT: Some feelings of bloating in the abdomen. Passage of more gas than usual.  Walking can help get rid of the air that was put into your GI tract during the procedure and reduce the bloating. If you had a lower endoscopy (such as a colonoscopy or flexible sigmoidoscopy) you may notice spotting of blood in your stool or on the toilet paper. If you underwent a bowel prep for your procedure, you may not have a normal bowel movement for a few days.  Please Note:  You might notice some irritation and congestion in your nose or some drainage.  This is from the oxygen used during your procedure.  There is no need for concern and it should clear up in a day or so.  SYMPTOMS TO REPORT IMMEDIATELY:   Following lower endoscopy (colonoscopy or flexible sigmoidoscopy):  Excessive amounts of blood in the stool  Significant tenderness or worsening of abdominal pains  Swelling of the abdomen that is new, acute  Fever of 100F or higher   For urgent or emergent issues, a gastroenterologist can be reached at any hour by calling [phone].   DIET: Your first meal following the procedure should be a small meal and then it is ok to progress to your normal diet. Heavy or fried foods are harder to digest and may make you feel nauseous or bloated.  Likewise, meals heavy in dairy and vegetables can increase bloating.  Drink plenty of fluids but you should avoid alcoholic beverages for 24 hours.  Try to  increase the fiber in your diet.  ACTIVITY:  You should plan to take it easy for the rest of today and you should NOT DRIVE or use heavy machinery until tomorrow (because of the sedation medicines used during the test).    FOLLOW UP: Our staff will call the number listed on your records the next business day following your procedure to check on you and address any questions or concerns that you may have regarding the information given to you following your procedure. If we do not reach you, we will leave a message.  However, if you are feeling well and you are not experiencing any problems, there is no need to return our call.  We will assume that you have returned to your regular daily activities without incident.  If any biopsies were taken you will be contacted by phone or by letter within the next 1-3 weeks.  Please call us at [phone] if you have not heard about the biopsies in 3 weeks.    SIGNATURES/CONFIDENTIALITY: You and/or your care partner have signed paperwork which will be entered into your electronic medical record.  These signatures attest to the fact that that the information above on your After Visit Summary has been reviewed and is understood.  Full responsibility of the confidentiality of this discharge information lies with you and/or your care-partner.  Read all of the handouts given to you by your recovery room nurse.  Thank-you  for choosing [name] for your healthcare needs today.

## 2015-12-24 ENCOUNTER — Telehealth: Payer: Self-pay

## 2015-12-24 NOTE — Telephone Encounter (Signed)
  Follow up Call-  Call back number 12/23/2015  Post procedure Call Back phone  # [phone]  Permission to leave phone message Yes    Patient was called for follow up after her procedure on 12/23/2015. No answer at the number given for follow up phone call. A message was left on the answering machine.

## 2015-12-31 ENCOUNTER — Encounter: Payer: Self-pay | Admitting: Gastroenterology

## 2016-01-15 ENCOUNTER — Ambulatory Visit: Payer: Self-pay | Admitting: Orthopedic Surgery

## 2016-01-15 NOTE — Progress Notes (Signed)
Preoperative surgical orders have been place into the Epic hospital system for Maria Marquez on 01/15/2016, 1:06 PM  by Mickel Crow for surgery on 01-28-16.  Preop Total Hip - Anterior Approach orders including IV Tylenol, and IV Decadron as long as there are no contraindications to the above medications. [name], PA-C

## 2016-01-21 ENCOUNTER — Encounter (HOSPITAL_COMMUNITY)
Admission: RE | Admit: 2016-01-21 | Discharge: 2016-01-21 | Disposition: A | Discharge status: Home / Self Care | Payer: Medicare HMO | Source: Ambulatory Visit | Attending: Orthopedic Surgery | Admitting: Orthopedic Surgery

## 2016-01-21 ENCOUNTER — Encounter (HOSPITAL_COMMUNITY): Payer: Self-pay

## 2016-01-21 DIAGNOSIS — M1611 Unilateral primary osteoarthritis, right hip: Secondary | ICD-10-CM | POA: Insufficient documentation

## 2016-01-21 DIAGNOSIS — Z01812 Encounter for preprocedural laboratory examination: Secondary | ICD-10-CM | POA: Diagnosis not present

## 2016-01-21 HISTORY — DX: Unspecified osteoarthritis, unspecified site: M19.90

## 2016-01-21 HISTORY — DX: Gastro-esophageal reflux disease without esophagitis: K21.9

## 2016-01-21 LAB — PROTIME-INR
INR: 1.1 (ref 0.00–1.49)
Prothrombin Time: 14 seconds (ref 11.6–15.2)

## 2016-01-21 LAB — URINALYSIS, ROUTINE W REFLEX MICROSCOPIC
Bilirubin Urine: NEGATIVE
Glucose, UA: NEGATIVE mg/dL
Ketones, ur: NEGATIVE mg/dL
Leukocytes, UA: NEGATIVE
Nitrite: NEGATIVE
Protein, ur: NEGATIVE mg/dL
Specific Gravity, Urine: 1.008 (ref 1.005–1.030)
pH: 5.5 (ref 5.0–8.0)

## 2016-01-21 LAB — COMPREHENSIVE METABOLIC PANEL
ALT: 19 U/L (ref 14–54)
AST: 18 U/L (ref 15–41)
Albumin: 4.2 g/dL (ref 3.5–5.0)
Alkaline Phosphatase: 58 U/L (ref 38–126)
Anion gap: 6 (ref 5–15)
BUN: 15 mg/dL (ref 6–20)
CO2: 28 mmol/L (ref 22–32)
Calcium: 9.1 mg/dL (ref 8.9–10.3)
Chloride: 106 mmol/L (ref 101–111)
Creatinine, Ser: 1.03 mg/dL — ABNORMAL HIGH (ref 0.44–1.00)
GFR calc Af Amer: >60 mL/min (ref >60)
GFR calc non Af Amer: 55 mL/min — ABNORMAL LOW (ref >60)
Glucose, Bld: 102 mg/dL — ABNORMAL HIGH (ref 65–99)
Potassium: 3.2 mmol/L — ABNORMAL LOW (ref 3.5–5.1)
Sodium: 140 mmol/L (ref 135–145)
Total Bilirubin: 0.9 mg/dL (ref 0.3–1.2)
Total Protein: 7.1 g/dL (ref 6.5–8.1)

## 2016-01-21 LAB — SURGICAL PCR SCREEN
MRSA, PCR: NEGATIVE
Staphylococcus aureus: NEGATIVE

## 2016-01-21 LAB — CBC
HCT: 39.4 % (ref 36.0–46.0)
Hemoglobin: 13.2 g/dL (ref 12.0–15.0)
MCH: 31.3 pg (ref 26.0–34.0)
MCHC: 33.5 g/dL (ref 30.0–36.0)
MCV: 93.4 fL (ref 78.0–100.0)
Platelets: 283 10*3/uL (ref 150–400)
RBC: 4.22 MIL/uL (ref 3.87–5.11)
RDW: 13.8 % (ref 11.5–15.5)
WBC: 8.7 10*3/uL (ref 4.0–10.5)

## 2016-01-21 LAB — URINE MICROSCOPIC-ADD ON

## 2016-01-21 LAB — APTT: aPTT: 31 seconds (ref 24–37)

## 2016-01-21 LAB — ABO/RH: ABO/RH(D): O POS

## 2016-01-21 NOTE — Patient Instructions (Addendum)
Maria Marquez  01/21/2016   Your procedure is scheduled on: 01/28/16  Report to [facility]  Entrance take [facility]  elevators to 3rd floor to [place] 11:00 AM.  Call this number if you have problems the morning of surgery [phone]   Remember: ONLY 1 PERSON MAY GO WITH YOU TO SHORT STAY TO GET  READY MORNING OF YOUR SURGERY.  Do not eat food or drink liquids :After Midnight.     Take these medicines the morning of surgery with A SIP OF WATER: Omeprazole                               You may not have any metal on your body including hair pins and              piercings  Do not wear jewelry, make-up, lotions, powders or perfumes, deodorant             Do not wear nail polish.  Do not shave  48 hours prior to surgery.              Men may shave face and neck.   Do not bring valuables to the hospital. [place].  Contacts, dentures or bridgework may not be worn into surgery.  Leave suitcase in the car. After surgery it may be brought to your room.                 _____________________________________________________________________             [facility] - Preparing for Surgery Before surgery, you can play an important role.  Because skin is not sterile, your skin needs to be as free of germs as possible.  You can reduce the number of germs on your skin by washing with CHG (chlorahexidine gluconate) soap before surgery.  CHG is an antiseptic cleaner which kills germs and bonds with the skin to continue killing germs even after washing. Please DO NOT use if you have an allergy to CHG or antibacterial soaps.  If your skin becomes reddened/irritated stop using the CHG and inform your nurse when you arrive at Short Stay. Do not shave (including legs and underarms) for at least 48 hours prior to the first CHG shower.  You may shave your face/neck. Please follow these instructions carefully:  1.   Shower with CHG Soap the night before surgery and the  morning of Surgery.  2.  If you choose to wash your hair, wash your hair first as usual with your  normal  shampoo.  3.  After you shampoo, rinse your hair and body thoroughly to remove the  shampoo.                           4.  Use CHG as you would any other liquid soap.  You can apply chg directly  to the skin and wash                       Gently with a scrungie or clean washcloth.  5.  Apply the CHG Soap to your body ONLY FROM THE NECK DOWN.   Do not use on face/  open                           Wound or open sores. Avoid contact with eyes, ears mouth and genitals (private parts).                       Wash face,  Genitals (private parts) with your normal soap.             6.  Wash thoroughly, paying special attention to the area where your surgery  will be performed.  7.  Thoroughly rinse your body with warm water from the neck down.  8.  DO NOT shower/wash with your normal soap after using and rinsing off  the CHG Soap.                9.  Pat yourself dry with a clean towel.            10.  Wear clean pajamas.            11.  Place clean sheets on your bed the night of your first shower and do not  sleep with pets. Day of Surgery : Do not apply any lotions/deodorants the morning of surgery.  Please wear clean clothes to the hospital/surgery center.  FAILURE TO FOLLOW THESE INSTRUCTIONS MAY RESULT IN THE CANCELLATION OF YOUR SURGERY PATIENT SIGNATURE_________________________________  NURSE SIGNATURE__________________________________  ________________________________________________________________________    CLEAR LIQUID DIET   Foods Allowed                                                                     Foods Excluded  Coffee and tea, regular and decaf                             liquids that you cannot  Plain Jell-O in any flavor                                             see through such as: Fruit ices (not with fruit  pulp)                                     milk, soups, orange juice  Iced Popsicles                                    All solid food Carbonated beverages, regular and diet                                    Cranberry, grape and apple juices Sports drinks like Gatorade Lightly seasoned clear broth or consume(fat free) Sugar, honey syrup  Sample Menu Breakfast  Lunch                                     Supper Cranberry juice                    Beef broth                            Chicken broth Jell-O                                     Grape juice                           Apple juice Coffee or tea                        Jell-O                                      Popsicle                                                Coffee or tea                        Coffee or tea  _____________________________________________________________________

## 2016-01-21 NOTE — Pre-Procedure Instructions (Signed)
CMP results routed to Dr. Wynelle Link

## 2016-01-24 NOTE — Anesthesia Preprocedure Evaluation (Addendum)
Anesthesia Evaluation  Patient identified by MRN, date of birth, ID band Patient awake    Reviewed: Allergy & Precautions, NPO status , Patient's Chart, lab work & pertinent test results  Airway Mallampati: II       Dental  (+) Partial Upper, Partial Lower   Pulmonary neg pulmonary ROS, Current Smoker,    breath sounds clear to auscultation       Cardiovascular hypertension, Pt. on medications negative cardio ROS   Rhythm:Regular  EKG 10/2015 OK   Neuro/Psych negative neurological ROS  negative psych ROS   GI/Hepatic negative GI ROS, Neg liver ROS, GERD  ,  Endo/Other  negative endocrine ROSHypothyroidism (replacement therapy) Obesity BMI 38  Renal/GU negative Renal ROS  negative genitourinary   Musculoskeletal negative musculoskeletal ROS (+)   Abdominal   Peds negative pediatric ROS (+)  Hematology negative hematology ROS (+) 13/39, INR 1.1, plts 283   Anesthesia Other Findings Eggs N/V, Latex skin rash  Reproductive/Obstetrics negative OB ROS                            Anesthesia Physical Anesthesia Plan  ASA: III  Anesthesia Plan: General   Post-op Pain Management:    Induction:   Airway Management Planned: Oral ETT  Additional Equipment:   Intra-op Plan:   Post-operative Plan: Extubation in OR  Informed Consent: I have reviewed the patients History and Physical, chart, labs and discussed the procedure including the risks, benefits and alternatives for the proposed anesthesia with the patient or authorized representative who has indicated his/her understanding and acceptance.     Plan Discussed with:   Anesthesia Plan Comments: (Will offer spinal)       Anesthesia Quick Evaluation

## 2016-01-27 NOTE — H&P (Signed)
TOTAL HIP ADMISSION H&P  Patient is admitted for right total hip arthroplasty.  Subjective:  Chief Complaint: right hip pain  HPI: Maria Marquez, 70 y.o. female, has a history of pain and functional disability in the right hip(s) due to arthritis and patient has failed non-surgical conservative treatments for greater than 12 weeks to include NSAID's and/or analgesics, corticosteriod injections, use of assistive devices and activity modification.  Onset of symptoms was gradual starting 2 years ago with gradually worsening course since that time.The patient noted no past surgery on the right hip(s).  Patient currently rates pain in the right hip at 7 out of 10 with activity. Patient has night pain, worsening of pain with activity and weight bearing, pain that interfers with activities of daily living and pain with passive range of motion. Patient has evidence of periarticular osteophytes and joint space narrowing by imaging studies. This condition presents safety issues increasing the risk of falls. There is no current active infection.   Past Medical History  Diagnosis Date  . Hypertension   . Hypothyroid   . GERD (gastroesophageal reflux disease)   . Arthritis     Past Surgical History  Procedure Laterality Date  . Thyroid lobectomy      right side  . Hernia repair        Current outpatient prescriptions:  .  acetaminophen (TYLENOL) 500 MG tablet, Take 1,000 mg by mouth daily., Disp: , Rfl:  .  aspirin EC 81 MG tablet, Take 81 mg by mouth daily., Disp: , Rfl:  .  Cyanocobalamin (VITAMIN B-12) 2500 MCG SUBL, Place 2,500 mcg under the tongue daily., Disp: , Rfl:  .  Estradiol-Norethindrone Acet 0.5-0.1 MG tablet, Take 1 tablet by mouth daily., Disp: , Rfl:  .  levothyroxine (SYNTHROID, LEVOTHROID) 137 MCG tablet, Take 137 mcg by mouth at bedtime. , Disp: , Rfl:  .  losartan-hydrochlorothiazide (HYZAAR) 100-25 MG tablet, Take 0.5 tablets by mouth daily. , Disp: , Rfl:  .  Multiple  Vitamin (MULTIVITAMIN) tablet, Take 1 tablet by mouth daily., Disp: , Rfl:  .  omeprazole (PRILOSEC) 20 MG capsule, Take 20 mg by mouth daily., Disp: , Rfl:  .  Propylene Glycol (SYSTANE BALANCE OP), Place 1 drop into both eyes daily as needed (For dry eyes.)., Disp: , Rfl:  .  rosuvastatin (CRESTOR) 10 MG tablet, Take 10 mg by mouth at bedtime., Disp: , Rfl:   Allergies  Allergen Reactions  . Eggs Or Egg-Derived Products Nausea And Vomiting  . Shellfish Allergy Hives  . Sulfa Antibiotics Nausea Only  . Latex Rash    Social History  Substance Use Topics  . Smoking status: Current Every Day Smoker -- 0.50 packs/day    Types: Cigarettes  . Smokeless tobacco: Not on file  . Alcohol Use: Yes     Comment: rarely      Review of Systems  Constitutional: Negative.   HENT: Negative.   Eyes: Negative.   Respiratory: Negative.   Cardiovascular: Negative.   Gastrointestinal: Positive for heartburn. Negative for nausea, vomiting, abdominal pain, diarrhea, constipation, blood in stool and melena.  Genitourinary: Negative.   Musculoskeletal: Positive for myalgias and joint pain. Negative for back pain, falls and neck pain.       Right hip pain  Skin: Negative.   Neurological: Negative.   Endo/Heme/Allergies: Negative.   Psychiatric/Behavioral: Negative.     Objective:  Physical Exam  Constitutional: She is oriented to person, place, and time. She appears well-developed and well-nourished. No distress.  HENT:  Head: Normocephalic and atraumatic.  Right Ear: External ear normal.  Left Ear: External ear normal.  Nose: Nose normal.  Mouth/Throat: Oropharynx is clear and moist.  Eyes: Conjunctivae and EOM are normal.  Neck: Normal range of motion. Neck supple.  Cardiovascular: Normal rate, regular rhythm, normal heart sounds and intact distal pulses.   No murmur heard. Respiratory: Effort normal and breath sounds normal. No respiratory distress. She has no wheezes.  GI: Soft. Bowel  sounds are normal. She exhibits no distension. There is no tenderness.  Musculoskeletal:  Left hip has normal range of motion, no discomfort. Right hip can be flexed to about 100. Minimal internal rotation, about 20 degrees of external rotation, 20 degrees of abduction. Knee exam is normal.   Neurological: She is alert and oriented to person, place, and time. She has normal strength and normal reflexes. No sensory deficit.  Skin: No rash noted. She is not diaphoretic. No erythema.  Psychiatric: She has a normal mood and affect. Her behavior is normal.    Vitals  Weight: 230 lb Height: 66in Body Surface Area: 2.12 m Body Mass Index: 37.12 kg/m  Pulse: 84 (Regular)  BP: 138/72 (Sitting, Left Arm, Standard)  Imaging Review Plain radiographs demonstrate severe degenerative joint disease of the right hip(s). The bone quality appears to be good for age and reported activity level.  Assessment/Plan:  End stage primary osteoarthritis, right hip(s)  The patient history, physical examination, clinical judgement of the provider and imaging studies are consistent with end stage degenerative joint disease of the right hip(s) and total hip arthroplasty is deemed medically necessary. The treatment options including medical management, injection therapy, arthroscopy and arthroplasty were discussed at length. The risks and benefits of total hip arthroplasty were presented and reviewed. The risks due to aseptic loosening, infection, stiffness, dislocation/subluxation,  thromboembolic complications and other imponderables were discussed.  The patient acknowledged the explanation, agreed to proceed with the plan and consent was signed. Patient is being admitted for inpatient treatment for surgery, pain control, PT, OT, prophylactic antibiotics, VTE prophylaxis, progressive ambulation and ADL's and discharge planning.The patient is planning to be discharged home with home health services vs SNF.   PCP:  Dr. Billey Chang   Ardeen Jourdain, PA-C

## 2016-01-28 ENCOUNTER — Inpatient Hospital Stay (HOSPITAL_COMMUNITY): Payer: Medicare HMO

## 2016-01-28 ENCOUNTER — Inpatient Hospital Stay (HOSPITAL_COMMUNITY): Payer: Medicare HMO | Admitting: Anesthesiology

## 2016-01-28 ENCOUNTER — Encounter (HOSPITAL_COMMUNITY): Payer: Self-pay | Admitting: *Deleted

## 2016-01-28 ENCOUNTER — Encounter (HOSPITAL_COMMUNITY)
Admission: RE | Disposition: A | Discharge status: Skilled Nursing Facility | Payer: Self-pay | Source: Ambulatory Visit | Attending: Orthopedic Surgery

## 2016-01-28 ENCOUNTER — Inpatient Hospital Stay (HOSPITAL_COMMUNITY)
Admission: RE | Admit: 2016-01-28 | Discharge: 2016-01-30 | DRG: 470 | Disposition: A | Discharge status: Skilled Nursing Facility | Payer: Medicare HMO | Source: Ambulatory Visit | Attending: Orthopedic Surgery | Admitting: Orthopedic Surgery

## 2016-01-28 DIAGNOSIS — Z6838 Body mass index (BMI) 38.0-38.9, adult: Secondary | ICD-10-CM

## 2016-01-28 DIAGNOSIS — Z91040 Latex allergy status: Secondary | ICD-10-CM | POA: Diagnosis not present

## 2016-01-28 DIAGNOSIS — F17210 Nicotine dependence, cigarettes, uncomplicated: Secondary | ICD-10-CM | POA: Diagnosis present

## 2016-01-28 DIAGNOSIS — Z91012 Allergy to eggs: Secondary | ICD-10-CM | POA: Diagnosis not present

## 2016-01-28 DIAGNOSIS — E039 Hypothyroidism, unspecified: Secondary | ICD-10-CM | POA: Diagnosis present

## 2016-01-28 DIAGNOSIS — M1611 Unilateral primary osteoarthritis, right hip: Principal | ICD-10-CM | POA: Diagnosis present

## 2016-01-28 DIAGNOSIS — K219 Gastro-esophageal reflux disease without esophagitis: Secondary | ICD-10-CM | POA: Diagnosis present

## 2016-01-28 DIAGNOSIS — E669 Obesity, unspecified: Secondary | ICD-10-CM | POA: Diagnosis present

## 2016-01-28 DIAGNOSIS — I10 Essential (primary) hypertension: Secondary | ICD-10-CM | POA: Diagnosis present

## 2016-01-28 DIAGNOSIS — Z7982 Long term (current) use of aspirin: Secondary | ICD-10-CM | POA: Diagnosis not present

## 2016-01-28 DIAGNOSIS — Z96649 Presence of unspecified artificial hip joint: Secondary | ICD-10-CM

## 2016-01-28 DIAGNOSIS — Z91013 Allergy to seafood: Secondary | ICD-10-CM | POA: Diagnosis not present

## 2016-01-28 DIAGNOSIS — Z79899 Other long term (current) drug therapy: Secondary | ICD-10-CM

## 2016-01-28 DIAGNOSIS — M169 Osteoarthritis of hip, unspecified: Secondary | ICD-10-CM | POA: Diagnosis present

## 2016-01-28 DIAGNOSIS — Z882 Allergy status to sulfonamides status: Secondary | ICD-10-CM | POA: Diagnosis not present

## 2016-01-28 HISTORY — PX: TOTAL HIP ARTHROPLASTY: SHX124

## 2016-01-28 SURGERY — ARTHROPLASTY, HIP, TOTAL, ANTERIOR APPROACH
Anesthesia: General | Site: Hip | Laterality: Right

## 2016-01-28 MED ORDER — LEVOTHYROXINE SODIUM 25 MCG PO TABS
137.0000 ug | ORAL_TABLET | Freq: Every day | ORAL | Status: DC
  Administered 2016-01-28 – 2016-01-29 (×2): 137 ug via ORAL
  Filled 2016-01-28 (×2): qty 1

## 2016-01-28 MED ORDER — BUPIVACAINE HCL (PF) 0.25 % IJ SOLN
INTRAMUSCULAR | Status: AC
  Filled 2016-01-28: qty 30

## 2016-01-28 MED ORDER — FENTANYL CITRATE (PF) 100 MCG/2ML IJ SOLN
INTRAMUSCULAR | Status: AC
  Filled 2016-01-28: qty 2

## 2016-01-28 MED ORDER — MENTHOL 3 MG MT LOZG: 1.0000 | LOZENGE | OROMUCOSAL | Status: DC | PRN

## 2016-01-28 MED ORDER — MIDAZOLAM HCL 5 MG/5ML IJ SOLN
INTRAMUSCULAR | Status: DC | PRN
  Administered 2016-01-28: 2 mg via INTRAVENOUS

## 2016-01-28 MED ORDER — ONDANSETRON HCL 4 MG/2ML IJ SOLN
INTRAMUSCULAR | Status: DC | PRN
  Administered 2016-01-28: 4 mg via INTRAVENOUS

## 2016-01-28 MED ORDER — PROPOFOL 10 MG/ML IV BOLUS
INTRAVENOUS | Status: AC
  Filled 2016-01-28: qty 20

## 2016-01-28 MED ORDER — DEXAMETHASONE SODIUM PHOSPHATE 10 MG/ML IJ SOLN
10.0000 mg | Freq: Once | INTRAMUSCULAR | Status: AC
  Administered 2016-01-28: 10 mg via INTRAVENOUS

## 2016-01-28 MED ORDER — TRANEXAMIC ACID 1000 MG/10ML IV SOLN
1000.0000 mg | Freq: Once | INTRAVENOUS | Status: AC
  Administered 2016-01-28: 1000 mg via INTRAVENOUS
  Filled 2016-01-28: qty 10

## 2016-01-28 MED ORDER — HYDROCHLOROTHIAZIDE 12.5 MG PO CAPS
12.5000 mg | ORAL_CAPSULE | Freq: Every day | ORAL | Status: DC
  Administered 2016-01-29 – 2016-01-30 (×2): 12.5 mg via ORAL
  Filled 2016-01-28 (×2): qty 1

## 2016-01-28 MED ORDER — METHOCARBAMOL 500 MG PO TABS
500.0000 mg | ORAL_TABLET | Freq: Four times a day (QID) | ORAL | Status: DC | PRN
  Administered 2016-01-29 – 2016-01-30 (×3): 500 mg via ORAL
  Filled 2016-01-28 (×3): qty 1

## 2016-01-28 MED ORDER — LOSARTAN POTASSIUM-HCTZ 100-25 MG PO TABS: 0.5000 | ORAL_TABLET | Freq: Every day | ORAL | Status: DC

## 2016-01-28 MED ORDER — LACTATED RINGERS IV SOLN: INTRAVENOUS | Status: DC

## 2016-01-28 MED ORDER — DEXAMETHASONE SODIUM PHOSPHATE 10 MG/ML IJ SOLN
INTRAMUSCULAR | Status: AC
  Filled 2016-01-28: qty 1

## 2016-01-28 MED ORDER — PANTOPRAZOLE SODIUM 40 MG PO TBEC
40.0000 mg | DELAYED_RELEASE_TABLET | Freq: Every day | ORAL | Status: DC
  Administered 2016-01-29 – 2016-01-30 (×2): 40 mg via ORAL
  Filled 2016-01-28 (×2): qty 1

## 2016-01-28 MED ORDER — TRANEXAMIC ACID 1000 MG/10ML IV SOLN
1000.0000 mg | INTRAVENOUS | Status: AC
  Administered 2016-01-28: 1000 mg via INTRAVENOUS
  Filled 2016-01-28: qty 10

## 2016-01-28 MED ORDER — ACETAMINOPHEN 10 MG/ML IV SOLN
1000.0000 mg | Freq: Once | INTRAVENOUS | Status: AC
  Administered 2016-01-28: 1000 mg via INTRAVENOUS

## 2016-01-28 MED ORDER — PROMETHAZINE HCL 25 MG/ML IJ SOLN: 6.2500 mg | INTRAMUSCULAR | Status: DC | PRN

## 2016-01-28 MED ORDER — PHENOL 1.4 % MT LIQD: 1.0000 | OROMUCOSAL | Status: DC | PRN

## 2016-01-28 MED ORDER — MORPHINE SULFATE (PF) 2 MG/ML IV SOLN
1.0000 mg | INTRAVENOUS | Status: DC | PRN
  Administered 2016-01-28: 1 mg via INTRAVENOUS
  Filled 2016-01-28: qty 1

## 2016-01-28 MED ORDER — METOCLOPRAMIDE HCL 5 MG/ML IJ SOLN: 5.0000 mg | Freq: Three times a day (TID) | INTRAMUSCULAR | Status: DC | PRN

## 2016-01-28 MED ORDER — LACTATED RINGERS IV SOLN
INTRAVENOUS | Status: DC | PRN
  Administered 2016-01-28 (×2): via INTRAVENOUS

## 2016-01-28 MED ORDER — LIDOCAINE HCL (CARDIAC) 20 MG/ML IV SOLN
INTRAVENOUS | Status: AC
  Filled 2016-01-28: qty 5

## 2016-01-28 MED ORDER — ACETAMINOPHEN 10 MG/ML IV SOLN
INTRAVENOUS | Status: AC
  Filled 2016-01-28: qty 100

## 2016-01-28 MED ORDER — PROPOFOL 10 MG/ML IV BOLUS
INTRAVENOUS | Status: AC
  Filled 2016-01-28: qty 40

## 2016-01-28 MED ORDER — PROPOFOL 500 MG/50ML IV EMUL
INTRAVENOUS | Status: DC | PRN
  Administered 2016-01-28: 100 ug/kg/min via INTRAVENOUS

## 2016-01-28 MED ORDER — ACETAMINOPHEN 500 MG PO TABS
1000.0000 mg | ORAL_TABLET | Freq: Four times a day (QID) | ORAL | Status: AC
  Administered 2016-01-28 – 2016-01-29 (×4): 1000 mg via ORAL
  Filled 2016-01-28 (×4): qty 2

## 2016-01-28 MED ORDER — CHLORHEXIDINE GLUCONATE 4 % EX LIQD: 60.0000 mL | Freq: Once | CUTANEOUS | Status: DC

## 2016-01-28 MED ORDER — TRAMADOL HCL 50 MG PO TABS
50.0000 mg | ORAL_TABLET | Freq: Four times a day (QID) | ORAL | Status: DC | PRN
  Administered 2016-01-29 – 2016-01-30 (×4): 50 mg via ORAL
  Filled 2016-01-28 (×4): qty 1

## 2016-01-28 MED ORDER — ACETAMINOPHEN 325 MG PO TABS: 650.0000 mg | ORAL_TABLET | Freq: Four times a day (QID) | ORAL | Status: DC | PRN

## 2016-01-28 MED ORDER — PHENYLEPHRINE HCL 10 MG/ML IJ SOLN
INTRAMUSCULAR | Status: DC | PRN
  Administered 2016-01-28: 40 ug via INTRAVENOUS
  Administered 2016-01-28 (×2): 80 ug via INTRAVENOUS

## 2016-01-28 MED ORDER — SODIUM CHLORIDE 0.9 % IV SOLN: INTRAVENOUS | Status: DC

## 2016-01-28 MED ORDER — ONDANSETRON HCL 4 MG PO TABS: 4.0000 mg | ORAL_TABLET | Freq: Four times a day (QID) | ORAL | Status: DC | PRN

## 2016-01-28 MED ORDER — ONDANSETRON HCL 4 MG/2ML IJ SOLN: 4.0000 mg | Freq: Four times a day (QID) | INTRAMUSCULAR | Status: DC | PRN

## 2016-01-28 MED ORDER — DIPHENHYDRAMINE HCL 12.5 MG/5ML PO ELIX
12.5000 mg | ORAL_SOLUTION | ORAL | Status: DC | PRN
  Filled 2016-01-28: qty 5

## 2016-01-28 MED ORDER — METOCLOPRAMIDE HCL 5 MG PO TABS: 5.0000 mg | ORAL_TABLET | Freq: Three times a day (TID) | ORAL | Status: DC | PRN

## 2016-01-28 MED ORDER — MEPERIDINE HCL 50 MG/ML IJ SOLN: 6.2500 mg | INTRAMUSCULAR | Status: DC | PRN

## 2016-01-28 MED ORDER — FLEET ENEMA 7-19 GM/118ML RE ENEM: 1.0000 | ENEMA | Freq: Once | RECTAL | Status: DC | PRN

## 2016-01-28 MED ORDER — EPHEDRINE SULFATE 50 MG/ML IJ SOLN
INTRAMUSCULAR | Status: DC | PRN
  Administered 2016-01-28 (×2): 5 mg via INTRAVENOUS

## 2016-01-28 MED ORDER — CEFAZOLIN SODIUM-DEXTROSE 2-4 GM/100ML-% IV SOLN
2.0000 g | INTRAVENOUS | Status: AC
  Administered 2016-01-28: 2 g via INTRAVENOUS
  Filled 2016-01-28: qty 100

## 2016-01-28 MED ORDER — SODIUM CHLORIDE 0.9 % IV SOLN
INTRAVENOUS | Status: DC
  Administered 2016-01-28: 17:00:00 via INTRAVENOUS
  Administered 2016-01-29: 1000 mL via INTRAVENOUS

## 2016-01-28 MED ORDER — POLYETHYLENE GLYCOL 3350 17 G PO PACK: 17.0000 g | PACK | Freq: Every day | ORAL | Status: DC | PRN

## 2016-01-28 MED ORDER — ROSUVASTATIN CALCIUM 5 MG PO TABS
10.0000 mg | ORAL_TABLET | Freq: Every day | ORAL | Status: DC
  Administered 2016-01-28 – 2016-01-29 (×2): 10 mg via ORAL
  Filled 2016-01-28 (×2): qty 2

## 2016-01-28 MED ORDER — ACETAMINOPHEN 650 MG RE SUPP: 650.0000 mg | Freq: Four times a day (QID) | RECTAL | Status: DC | PRN

## 2016-01-28 MED ORDER — OXYCODONE HCL 5 MG PO TABS
5.0000 mg | ORAL_TABLET | ORAL | Status: DC | PRN
  Administered 2016-01-28 – 2016-01-29 (×3): 10 mg via ORAL
  Filled 2016-01-28 (×3): qty 2

## 2016-01-28 MED ORDER — BUPIVACAINE IN DEXTROSE 0.75-8.25 % IT SOLN
INTRATHECAL | Status: DC | PRN
  Administered 2016-01-28: 2 mL via INTRATHECAL

## 2016-01-28 MED ORDER — RIVAROXABAN 10 MG PO TABS
10.0000 mg | ORAL_TABLET | Freq: Every day | ORAL | Status: DC
  Administered 2016-01-29 – 2016-01-30 (×2): 10 mg via ORAL
  Filled 2016-01-28 (×2): qty 1

## 2016-01-28 MED ORDER — ONDANSETRON HCL 4 MG/2ML IJ SOLN
INTRAMUSCULAR | Status: AC
  Filled 2016-01-28: qty 2

## 2016-01-28 MED ORDER — DEXAMETHASONE SODIUM PHOSPHATE 10 MG/ML IJ SOLN
10.0000 mg | Freq: Once | INTRAMUSCULAR | Status: AC
  Administered 2016-01-29: 10 mg via INTRAVENOUS
  Filled 2016-01-28: qty 1

## 2016-01-28 MED ORDER — CEFAZOLIN SODIUM-DEXTROSE 2-4 GM/100ML-% IV SOLN
INTRAVENOUS | Status: AC
  Filled 2016-01-28: qty 100

## 2016-01-28 MED ORDER — DOCUSATE SODIUM 100 MG PO CAPS
100.0000 mg | ORAL_CAPSULE | Freq: Two times a day (BID) | ORAL | Status: DC
  Administered 2016-01-28 – 2016-01-30 (×4): 100 mg via ORAL
  Filled 2016-01-28 (×4): qty 1

## 2016-01-28 MED ORDER — BISACODYL 10 MG RE SUPP: 10.0000 mg | Freq: Every day | RECTAL | Status: DC | PRN

## 2016-01-28 MED ORDER — ROCURONIUM BROMIDE 100 MG/10ML IV SOLN
INTRAVENOUS | Status: AC
  Filled 2016-01-28: qty 1

## 2016-01-28 MED ORDER — LOSARTAN POTASSIUM 50 MG PO TABS
50.0000 mg | ORAL_TABLET | Freq: Every day | ORAL | Status: DC
  Administered 2016-01-29 – 2016-01-30 (×2): 50 mg via ORAL
  Filled 2016-01-28 (×2): qty 1

## 2016-01-28 MED ORDER — BUPIVACAINE HCL (PF) 0.25 % IJ SOLN
INTRAMUSCULAR | Status: DC | PRN
  Administered 2016-01-28: 30 mL

## 2016-01-28 MED ORDER — FENTANYL CITRATE (PF) 250 MCG/5ML IJ SOLN
INTRAMUSCULAR | Status: DC | PRN
  Administered 2016-01-28 (×2): 50 ug via INTRAVENOUS

## 2016-01-28 MED ORDER — PHENYLEPHRINE 40 MCG/ML (10ML) SYRINGE FOR IV PUSH (FOR BLOOD PRESSURE SUPPORT)
PREFILLED_SYRINGE | INTRAVENOUS | Status: AC
  Filled 2016-01-28: qty 10

## 2016-01-28 MED ORDER — MIDAZOLAM HCL 2 MG/2ML IJ SOLN
INTRAMUSCULAR | Status: AC
  Filled 2016-01-28: qty 2

## 2016-01-28 MED ORDER — METHOCARBAMOL 1000 MG/10ML IJ SOLN
500.0000 mg | Freq: Four times a day (QID) | INTRAVENOUS | Status: DC | PRN
  Administered 2016-01-28: 500 mg via INTRAVENOUS
  Filled 2016-01-28: qty 550
  Filled 2016-01-28: qty 5

## 2016-01-28 MED ORDER — FENTANYL CITRATE (PF) 100 MCG/2ML IJ SOLN
25.0000 ug | INTRAMUSCULAR | Status: DC | PRN
  Administered 2016-01-28: 25 ug via INTRAVENOUS
  Administered 2016-01-28: 50 ug via INTRAVENOUS

## 2016-01-28 SURGICAL SUPPLY — 35 items
BAG DECANTER FOR FLEXI CONT (MISCELLANEOUS) ×2 IMPLANT
BAG SPEC THK2 15X12 ZIP CLS (MISCELLANEOUS)
BAG ZIPLOCK 12X15 (MISCELLANEOUS) IMPLANT
BLADE SAG 18X100X1.27 (BLADE) ×2 IMPLANT
CAPT HIP TOTAL 2 ×1 IMPLANT
CLOTH BEACON ORANGE TIMEOUT ST (SAFETY) ×2 IMPLANT
COVER PERINEAL POST (MISCELLANEOUS) ×2 IMPLANT
DECANTER SPIKE VIAL GLASS SM (MISCELLANEOUS) ×2 IMPLANT
DRAPE STERI IOBAN 125X83 (DRAPES) ×2 IMPLANT
DRAPE U-SHAPE 47X51 STRL (DRAPES) ×4 IMPLANT
DRSG ADAPTIC 3X8 NADH LF (GAUZE/BANDAGES/DRESSINGS) ×2 IMPLANT
DRSG MEPILEX BORDER 4X4 (GAUZE/BANDAGES/DRESSINGS) ×2 IMPLANT
DRSG MEPILEX BORDER 4X8 (GAUZE/BANDAGES/DRESSINGS) ×2 IMPLANT
DURAPREP 26ML APPLICATOR (WOUND CARE) ×2 IMPLANT
ELECT REM PT RETURN 9FT ADLT (ELECTROSURGICAL) ×2
ELECTRODE REM PT RTRN 9FT ADLT (ELECTROSURGICAL) ×1 IMPLANT
EVACUATOR 1/8 PVC DRAIN (DRAIN) ×2 IMPLANT
GLOVE BIO SURGEON STRL SZ7.5 (GLOVE) ×1 IMPLANT
GLOVE BIO SURGEON STRL SZ8 (GLOVE) ×2 IMPLANT
GLOVE BIOGEL PI IND STRL 8 (GLOVE) ×2 IMPLANT
GLOVE BIOGEL PI INDICATOR 8 (GLOVE) ×4
GOWN STRL REUS W/TWL LRG LVL3 (GOWN DISPOSABLE) ×2 IMPLANT
GOWN STRL REUS W/TWL XL LVL3 (GOWN DISPOSABLE) ×2 IMPLANT
PACK ANTERIOR HIP CUSTOM (KITS) ×2 IMPLANT
STRIP CLOSURE SKIN 1/2X4 (GAUZE/BANDAGES/DRESSINGS) ×2 IMPLANT
SUT ETHIBOND NAB CT1 #1 30IN (SUTURE) ×2 IMPLANT
SUT MNCRL AB 4-0 PS2 18 (SUTURE) ×2 IMPLANT
SUT VIC AB 2-0 CT1 27 (SUTURE) ×4
SUT VIC AB 2-0 CT1 TAPERPNT 27 (SUTURE) ×2 IMPLANT
SUT VLOC 180 0 24IN GS25 (SUTURE) ×3 IMPLANT
SYR 50ML LL SCALE MARK (SYRINGE) IMPLANT
TRAY FOLEY BAG SILVER LF 16FR (SET/KITS/TRAYS/PACK) ×1 IMPLANT
TRAY FOLEY W/METER SILVER 14FR (SET/KITS/TRAYS/PACK) ×1 IMPLANT
TRAY FOLEY W/METER SILVER 16FR (SET/KITS/TRAYS/PACK) ×1 IMPLANT
YANKAUER SUCT BULB TIP 10FT TU (MISCELLANEOUS) ×2 IMPLANT

## 2016-01-28 NOTE — Progress Notes (Signed)
Blister noted on left ring finger.. No drainage noted at this time

## 2016-01-28 NOTE — Interval H&P Note (Signed)
History and Physical Interval Note:  01/28/2016 11:50 AM  Maria Marquez  has presented today for surgery, with the diagnosis of RIGHT HIP OA  The various methods of treatment have been discussed with the patient and family. After consideration of risks, benefits and other options for treatment, the patient has consented to  Procedure(s): RIGHT TOTAL HIP ARTHROPLASTY ANTERIOR APPROACH (Right) as a surgical intervention .  The patient's history has been reviewed, patient examined, no change in status, stable for surgery.  I have reviewed the patient's chart and labs.  Questions were answered to the patient's satisfaction.     Gearlean Alf

## 2016-01-28 NOTE — Anesthesia Postprocedure Evaluation (Signed)
Anesthesia Post Note  Patient: Maria Marquez  Procedure(s) Performed: Procedure(s) (LRB): RIGHT TOTAL HIP ARTHROPLASTY ANTERIOR APPROACH (Right)  Patient location during evaluation: PACU Anesthesia Type: General and Spinal Level of consciousness: awake and alert Pain management: pain level controlled Vital Signs Assessment: post-procedure vital signs reviewed and stable Respiratory status: spontaneous breathing, nonlabored ventilation, respiratory function stable and patient connected to nasal cannula oxygen Cardiovascular status: blood pressure returned to baseline and stable Postop Assessment: no signs of nausea or vomiting Anesthetic complications: no    Last Vitals:  Filed Vitals:   01/28/16 1415 01/28/16 1430  BP: 109/57 128/65  Pulse: 65 63  Temp:    Resp: 13 13    Last Pain:  Filed Vitals:   01/28/16 1437  PainSc: 0-No pain    LLE Motor Response: Purposeful movement (01/28/16 1430) LLE Sensation: No sensation (absent) (01/28/16 1430) RLE Motor Response: Purposeful movement (01/28/16 1430) RLE Sensation: No sensation (absent) (01/28/16 1430) L Sensory Level: L1-Inguinal (groin) region (01/28/16 1430) R Sensory Level: L1-Inguinal (groin) region (01/28/16 1430)  Alexis Frock

## 2016-01-28 NOTE — Op Note (Signed)
OPERATIVE REPORT  PREOPERATIVE DIAGNOSIS: Osteoarthritis of the Right hip.   POSTOPERATIVE DIAGNOSIS: Osteoarthritis of the Right  hip.   PROCEDURE: Right total hip arthroplasty, anterior approach.   SURGEON: [name], MD   ASSISTANT: Molli Barrows, PA-C  ANESTHESIA:  Spinal  ESTIMATED BLOOD LOSS:-300 ml    DRAINS: Hemovac x1.   COMPLICATIONS: None   CONDITION: PACU - hemodynamically stable.   BRIEF CLINICAL NOTE: Maria Marquez is a 70 y.o. female who has advanced end-  stage arthritis of her Right  hip with progressively worsening pain and  dysfunction.The patient has failed nonoperative management and presents for  total hip arthroplasty.   PROCEDURE IN DETAIL: After successful administration of spinal  anesthetic, the traction boots for the [facility] bed were placed on both  feet and the patient was placed onto the [facility] bed, boots placed into the leg  holders. The Right hip was then isolated from the perineum with plastic  drapes and prepped and draped in the usual sterile fashion. ASIS and  greater trochanter were marked and a oblique incision was made, starting  at about 1 cm lateral and 2 cm distal to the ASIS and coursing towards  the anterior cortex of the femur. The skin was cut with a 10 blade  through subcutaneous tissue to the level of the fascia overlying the  tensor fascia lata muscle. The fascia was then incised in line with the  incision at the junction of the anterior third and posterior 2/3rd. The  muscle was teased off the fascia and then the interval between the TFL  and the rectus was developed. The Hohmann retractor was then placed at  the top of the femoral neck over the capsule. The vessels overlying the  capsule were cauterized and the fat on top of the capsule was removed.  A Hohmann retractor was then placed anterior underneath the rectus  femoris to give exposure to the entire anterior capsule. A T-shaped  capsulotomy was performed.  The edges were tagged and the femoral head  was identified.       Osteophytes are removed off the superior acetabulum.  The femoral neck was then cut in situ with an oscillating saw. Traction  was then applied to the left lower extremity utilizing the [facility]  traction. The femoral head was then removed. Retractors were placed  around the acetabulum and then circumferential removal of the labrum was  performed. Osteophytes were also removed. Reaming starts at 45 mm to  medialize and  Increased in 2 mm increments to 49 mm. We reamed in  approximately 40 degrees of abduction, 20 degrees anteversion. A 50 mm  pinnacle acetabular shell was then impacted in anatomic position under  fluoroscopic guidance with excellent purchase. We did not need to place  any additional dome screws. A 32 mm neutral + 4 marathon liner was then  placed into the acetabular shell.       The femoral lift was then placed along the lateral aspect of the femur  just distal to the vastus ridge. The leg was  externally rotated and capsule  was stripped off the inferior aspect of the femoral neck down to the  level of the lesser trochanter, this was done with electrocautery. The femur was lifted after this was performed. The  leg was then placed and extended in adducted position to essentially delivering the femur. We also removed the capsule superiorly and the  piriformis from the piriformis  fossa to gain excellent exposure of the  proximal femur. Rongeur was used to remove some cancellous bone to get  into the lateral portion of the proximal femur for placement of the  initial starter reamer. The starter broaches was placed  the starter broach  and was shown to go down the center of the canal. Broaching  with the  Corail system was then performed starting at size 8, coursing  Up to size 11. A size 11 had excellent torsional and rotational  and axial stability. The trial high offset neck was then placed  with a 32 + 5 trial  head. The hip was then reduced. We confirmed that  the stem was in the canal both on AP and lateral x-rays. It also has excellent sizing. The hip was reduced with outstanding stability through full extension, full external rotation,  and then flexion in adduction internal rotation. AP pelvis was taken  and the leg lengths were measured and found to be exactly equal. Hip  was then dislocated again and the femoral head and neck removed. The  femoral broach was removed. Size 11 Corail stem with a high offset  neck was then impacted into the femur following native anteversion. Has  excellent purchase in the canal. Excellent torsional and rotational and  axial stability. It is confirmed to be in the canal on AP and lateral  fluoroscopic views. The 32 + 5 ceramic head was placed and the hip  reduced with outstanding stability. Again AP pelvis was taken and it  confirmed that the leg lengths were equal. The wound was then copiously  irrigated with saline solution and the capsule reattached and repaired  with Ethibond suture. 30 ml of .25% Bupivicaine injected into the capsule and into the edge of the tensor fascia lata as well as subcutaneous tissue. The fascia overlying the tensor fascia lata was  then closed with a running #1 V-Loc. Subcu was closed with interrupted  2-0 Vicryl and subcuticular running 4-0 Monocryl. Incision was cleaned  and dried. Steri-Strips and a bulky sterile dressing applied. Hemovac  drain was hooked to suction and then she was awakened and transported to  recovery in stable condition.        Please note that a surgical assistant was a medical necessity for this procedure to perform it in a safe and expeditious manner. Assistant was necessary to provide appropriate retraction of vital neurovascular structures and to prevent femoral fracture and allow for anatomic placement of the prosthesis.  [name], M.D.

## 2016-01-28 NOTE — Transfer of Care (Signed)
Immediate Anesthesia Transfer of Care Note  Patient: Maria Marquez  Procedure(s) Performed: Procedure(s): RIGHT TOTAL HIP ARTHROPLASTY ANTERIOR APPROACH (Right)  Patient Location: PACU  Anesthesia Type:Spinal  Level of Consciousness: awake, alert  and patient cooperative  Airway & Oxygen Therapy: Patient Spontanous Breathing and Patient connected to face mask oxygen  Post-op Assessment: Report given to RN and Post -op Vital signs reviewed and stable  Post vital signs: Reviewed and stable  Last Vitals:  Filed Vitals:   01/28/16 1104  BP: 175/73  Pulse: 80  Temp: 36.8 C  Resp: 18    Last Pain:  Filed Vitals:   01/28/16 1127  PainSc: 5       Patients Stated Pain Goal: 4 (59/29/24 4628)  Complications: No apparent anesthesia complications

## 2016-01-28 NOTE — Anesthesia Procedure Notes (Signed)
Spinal Patient location during procedure: OR Start time: 01/28/2016 12:25 PM End time: 01/28/2016 12:40 PM Staffing Anesthesiologist: Alexis Frock Performed by: anesthesiologist  Preanesthetic Checklist Completed: patient identified, site marked, surgical consent, pre-op evaluation, timeout performed, IV checked, risks and benefits discussed and monitors and equipment checked Spinal Block Patient position: sitting Prep: Betadine Patient monitoring: heart rate, continuous pulse ox, blood pressure and cardiac monitor Approach: midline Location: L4-5 Injection technique: single-shot Needle Needle type: Introducer and Pencil-Tip  Needle gauge: 24 G Needle length: 9 cm Needle insertion depth: 6 cm Additional Notes Negative paresthesia. Negative blood return. Positive free-flowing CSF. Expiration date of kit checked and confirmed. Patient tolerated procedure well, without complications.

## 2016-01-29 ENCOUNTER — Encounter (HOSPITAL_COMMUNITY): Payer: Self-pay | Admitting: Student

## 2016-01-29 LAB — CBC
HCT: 34.1 % — ABNORMAL LOW (ref 36.0–46.0)
Hemoglobin: 11.3 g/dL — ABNORMAL LOW (ref 12.0–15.0)
MCH: 30.9 pg (ref 26.0–34.0)
MCHC: 33.1 g/dL (ref 30.0–36.0)
MCV: 93.2 fL (ref 78.0–100.0)
Platelets: 252 10*3/uL (ref 150–400)
RBC: 3.66 MIL/uL — ABNORMAL LOW (ref 3.87–5.11)
RDW: 13.5 % (ref 11.5–15.5)
WBC: 16.1 10*3/uL — ABNORMAL HIGH (ref 4.0–10.5)

## 2016-01-29 LAB — BASIC METABOLIC PANEL
Anion gap: 6 (ref 5–15)
BUN: 18 mg/dL (ref 6–20)
CO2: 25 mmol/L (ref 22–32)
Calcium: 8.4 mg/dL — ABNORMAL LOW (ref 8.9–10.3)
Chloride: 107 mmol/L (ref 101–111)
Creatinine, Ser: 1.04 mg/dL — ABNORMAL HIGH (ref 0.44–1.00)
GFR calc Af Amer: >60 mL/min (ref >60)
GFR calc non Af Amer: 54 mL/min — ABNORMAL LOW (ref >60)
Glucose, Bld: 156 mg/dL — ABNORMAL HIGH (ref 65–99)
Potassium: 4.1 mmol/L (ref 3.5–5.1)
Sodium: 138 mmol/L (ref 135–145)

## 2016-01-29 MED ORDER — RIVAROXABAN 10 MG PO TABS: 10.0000 mg | ORAL_TABLET | Freq: Every day | ORAL | Status: DC

## 2016-01-29 MED ORDER — OXYCODONE HCL 5 MG PO TABS: 5.0000 mg | ORAL_TABLET | ORAL | Status: DC | PRN

## 2016-01-29 MED ORDER — TRAMADOL HCL 50 MG PO TABS: 50.0000 mg | ORAL_TABLET | Freq: Four times a day (QID) | ORAL | Status: DC | PRN

## 2016-01-29 MED ORDER — METHOCARBAMOL 500 MG PO TABS: 500.0000 mg | ORAL_TABLET | Freq: Four times a day (QID) | ORAL | Status: DC | PRN

## 2016-01-29 NOTE — Clinical Social Work Note (Signed)
Clinical Social Work Assessment  Patient Details  Name: Maria Marquez MRN: [id number] Date of Birth: 07-14-1946  Date of referral:  01/29/16               Reason for consult:  Facility Placement, Discharge Planning                Permission sought to share information with:  Chartered certified accountant granted to share information::  Yes, Verbal Permission Granted  Name::        Agency::     Relationship::     Contact Information:     Housing/Transportation Living arrangements for the past 2 months:  Single Family Home Source of Information:  Patient, Adult Children Patient Interpreter Needed:  None Criminal Activity/Legal Involvement Pertinent to Current Situation/Hospitalization:  No - Comment as needed Significant Relationships:  Adult Children Lives with:  Self Do you feel safe going back to the place where you live?   (Depends on progression.) Need for family participation in patient care:  No (Coment)  Care giving concerns:  Pt lives alone and has limited family assistance.   Social Worker assessment / plan:  Pt hospitalized on 01/28/16 for pre planned right total hip arthroplasty.  CSW met with pt / daughter to assist with d/c planning. Pt would like to go home at d/c but will consider ST Rehab if needed. PT / OT evals completed this am. Therapy recommends SNF unless pt progresses to a higher level of independence prior to d/c. Pt reports that if ST Rehab is needed she would like to go to [facility]. SNF contacted and bed offer received pending [employer] authorization. CSW will continue to follow to assist with d/c planning as needed.  Employment status:  Retired Nurse, adult PT Recommendations:  [place] / Referral to community resources:  [place]  Patient/Family's Response to care:  Disposion  to be determined.  Patient/Family's Understanding of and Emotional Response to Diagnosis,  Current Treatment, and Prognosis: Pt / daughter are aware of pt's medical status. Pt is motivated to work with therapy. " I would like to go home from here I'm just not sure how I'll manage alone if I'm still in pain and having trouble getting around. " Emotional support provided.  Emotional Assessment Appearance:  Appears stated age Attitude/Demeanor/Rapport:  Other (cooperative) Affect (typically observed):  Anxious Orientation:  Oriented to Self, Oriented to Place, Oriented to  Time, Oriented to Situation Alcohol / Substance use:  Other Psych involvement (Current and /or in the community):  No (Comment)  Discharge Needs  Concerns to be addressed:  Discharge Planning Concerns Readmission within the last 30 days:  No Current discharge risk:  None Barriers to Discharge:  No Barriers Identified   Loraine Maple  [phone] 01/29/2016, 9:17 AM

## 2016-01-29 NOTE — Progress Notes (Signed)
Physical Therapy Treatment Patient Details Name: Maria Marquez MRN: [id number] DOB: Apr 15, 1946 Today's Date: 20-Feb-2016    History of Present Illness s/p RIGHT TOTAL HIP ARTHROPLASTY ANTERIOR APPROACH (Right)    PT Comments    Pt moving slowly but with significant improvement in activity tolerance vs am session  Follow Up Recommendations  SNF     Equipment Recommendations  Rolling walker with 5" wheels    Recommendations for Other Services OT consult     Precautions / Restrictions Precautions Precautions: Fall Restrictions Weight Bearing Restrictions: No Other Position/Activity Restrictions: WBAT    Mobility  Bed Mobility Overal bed mobility: Needs Assistance Bed Mobility: Sit to Supine       Sit to supine: Min assist;Mod assist   General bed mobility comments: cues for technique; assist for RLE   Transfers Overall transfer level: Needs assistance Equipment used: Rolling walker (2 wheeled) Transfers: Sit to/from Stand Sit to Stand: Min assist;From elevated surface         General transfer comment: cues for hand placement/technique; assist to rise and steady.  Pt from chair, to EOB and to/from [facility]  Ambulation/Gait Ambulation/Gait assistance: Min assist Ambulation Distance (Feet): 75 Feet (and 24' to bathroom) Assistive device: Rolling walker (2 wheeled) Gait Pattern/deviations: Step-to pattern;Step-through pattern;Decreased step length - right;Decreased step length - left;Shuffle;Trunk flexed Gait velocity: decr   General Gait Details: cues for sequence, posture and position from RW.   Stairs            Wheelchair Mobility    Modified Rankin (Stroke Patients Only)       Balance                                    Cognition Arousal/Alertness: Awake/alert Behavior During Therapy: WFL for tasks assessed/performed Overall Cognitive Status: Within Functional Limits for tasks assessed                      Exercises       General Comments        Pertinent Vitals/Pain Pain Assessment: 0-10 Pain Score: 4  Pain Location: R hip Pain Descriptors / Indicators: Aching;Sore Pain Intervention(s): Limited activity within patient's tolerance;Monitored during session;Ice applied    Home Living                      Prior Function            PT Goals (current goals can now be found in the care plan section) Acute Rehab PT Goals Patient Stated Goal: to go home PT Goal Formulation: With patient Time For Goal Achievement: 02/04/16 Potential to Achieve Goals: Good Progress towards PT goals: Progressing toward goals    Frequency  7X/week    PT Plan Current plan remains appropriate    Co-evaluation             End of Session Equipment Utilized During Treatment: Gait belt Activity Tolerance: Patient tolerated treatment well Patient left: in bed;with call bell/phone within reach;with bed alarm set     Time: 4268-3419 PT Time Calculation (min) (ACUTE ONLY): 30 min  Charges:  $Gait Training: 23-37 mins                    G Codes:      Bradwell,Briani Maul 2016-02-20, 5:41 PM

## 2016-01-29 NOTE — Clinical Social Work Placement (Signed)
   CLINICAL SOCIAL WORK PLACEMENT  NOTE  Date:  01/29/2016  Patient Details  Name: Vinie Charity MRN: [id number] Date of Birth: May 04, 1946  Clinical Social Work is seeking post-discharge placement for this patient at the [place] level of care (*CSW will initial, date and re-position this form in  chart as items are completed):  Yes   Patient/family provided with [facility] Work Department's list of facilities offering this level of care within the geographic area requested by the patient (or if unable, by the patient's family).  Yes   Patient/family informed of their freedom to choose among providers that offer the needed level of care, that participate in Medicare, Medicaid or managed care program needed by the patient, have an available bed and are willing to accept the patient.  Yes   Patient/family informed of [place]'s ownership interest in [facility] and [facility], as well as of the fact that they are under no obligation to receive care at these facilities.  PASRR submitted to EDS on 01/29/16     PASRR number received on 01/29/16     Existing PASRR number confirmed on       FL2 transmitted to all facilities in geographic area requested by pt/family on 01/29/16     FL2 transmitted to all facilities within larger geographic area on       Patient informed that his/her managed care company has contracts with or will negotiate with certain facilities, including the following:        Yes   Patient/family informed of bed offers received.  Patient chooses bed at [facility]     Physician recommends and patient chooses bed at      Patient to be transferred to   on  .  Patient to be transferred to facility by       Patient family notified on   of transfer.  Name of family member notified:        PHYSICIAN       Additional Comment:    _______________________________________________ Luretha Rued, [place] 01/29/2016, 10:30 AM

## 2016-01-29 NOTE — Discharge Instructions (Signed)
Dr. [name] Total Joint Specialist [facility] [address], [place], [place] [address] [phone]  ANTERIOR APPROACH TOTAL HIP REPLACEMENT POSTOPERATIVE DIRECTIONS   Hip Rehabilitation, Guidelines Following Surgery  The results of a hip operation are greatly improved after range of motion and muscle strengthening exercises. Follow all safety measures which are given to protect your hip. If any of these exercises cause increased pain or swelling in your joint, decrease the amount until you are comfortable again. Then slowly increase the exercises. Call your caregiver if you have problems or questions.   HOME CARE INSTRUCTIONS  Remove items at home which could result in a fall. This includes throw rugs or furniture in walking pathways.   ICE to the affected hip every three hours for 30 minutes at a time and then as needed for pain and swelling.  Continue to use ice on the hip for pain and swelling from surgery. You may notice swelling that will progress down to the foot and ankle.  This is normal after surgery.  Elevate the leg when you are not up walking on it.    Continue to use the breathing machine which will help keep your temperature down.  It is common for your temperature to cycle up and down following surgery, especially at night when you are not up moving around and exerting yourself.  The breathing machine keeps your lungs expanded and your temperature down.   DIET You may resume your previous home diet once your are discharged from the hospital.  DRESSING / WOUND CARE / SHOWERING You may start showering once you are discharged home but do not submerge the incision under water. Just pat the incision dry and apply a dry gauze dressing on daily. Change the surgical dressing daily and reapply a dry dressing each time.  ACTIVITY Walk with your walker as instructed. Use walker as long as suggested by your caregivers. Avoid periods of inactivity  such as sitting longer than an hour when not asleep. This helps prevent blood clots.  You may resume a sexual relationship in one month or when given the OK by your doctor.  You may return to work once you are cleared by your doctor.  Do not drive a car for 6 weeks or until released by you surgeon.  Do not drive while taking narcotics.  WEIGHT BEARING Weight bearing as tolerated with assist device (walker, cane, etc) as directed, use it as long as suggested by your surgeon or therapist, typically at least 4-6 weeks.  POSTOPERATIVE CONSTIPATION PROTOCOL Constipation - defined medically as fewer than three stools per week and severe constipation as less than one stool per week.  One of the most common issues patients have following surgery is constipation.  Even if you have a regular bowel pattern at home, your normal regimen is likely to be disrupted due to multiple reasons following surgery.  Combination of anesthesia, postoperative narcotics, change in appetite and fluid intake all can affect your bowels.  In order to avoid complications following surgery, here are some recommendations in order to help you during your recovery period.  Colace (docusate) - Pick up an over-the-counter form of Colace or another stool softener and take twice a day as long as you are requiring postoperative pain medications.  Take with a full glass of water daily.  If you experience loose stools or diarrhea, hold the colace until you stool forms back up.  If your symptoms do not get better within 1  week or if they get worse, check with your doctor.  Dulcolax (bisacodyl) - Pick up over-the-counter and take as directed by the product packaging as needed to assist with the movement of your bowels.  Take with a full glass of water.  Use this product as needed if not relieved by Colace only.   MiraLax (polyethylene glycol) - Pick up over-the-counter to have on hand.  MiraLax is a solution that will increase the amount of  water in your bowels to assist with bowel movements.  Take as directed and can mix with a glass of water, juice, soda, coffee, or tea.  Take if you go more than two days without a movement. Do not use MiraLax more than once per day. Call your doctor if you are still constipated or irregular after using this medication for 7 days in a row.  If you continue to have problems with postoperative constipation, please contact the office for further assistance and recommendations.  If you experience "the worst abdominal pain ever" or develop nausea or vomiting, please contact the office immediatly for further recommendations for treatment.  ITCHING  If you experience itching with your medications, try taking only a single pain pill, or even half a pain pill at a time.  You can also use Benadryl over the counter for itching or also to help with sleep.   TED HOSE STOCKINGS Wear the elastic stockings on both legs for three weeks following surgery during the day but you may remove then at night for sleeping.  MEDICATIONS See your medication summary on the After Visit Summary that the nursing staff will review with you prior to discharge.  You may have some home medications which will be placed on hold until you complete the course of blood thinner medication.  It is important for you to complete the blood thinner medication as prescribed by your surgeon.  Continue your approved medications as instructed at time of discharge.  PRECAUTIONS If you experience chest pain or shortness of breath - call 911 immediately for transfer to the hospital emergency department.  If you develop a fever greater that 101 F, purulent drainage from wound, increased redness or drainage from wound, foul odor from the wound/dressing, or calf pain - CONTACT YOUR SURGEON.                                                   FOLLOW-UP APPOINTMENTS Make sure you keep all of your appointments after your operation with your surgeon and  caregivers. You should call the office at the above phone number and make an appointment for approximately two weeks after the date of your surgery or on the date instructed by your surgeon outlined in the "After Visit Summary".  RANGE OF MOTION AND STRENGTHENING EXERCISES  These exercises are designed to help you keep full movement of your hip joint. Follow your caregiver's or physical therapist's instructions. Perform all exercises about fifteen times, three times per day or as directed. Exercise both hips, even if you have had only one joint replacement. These exercises can be done on a training (exercise) mat, on the floor, on a table or on a bed. Use whatever works the best and is most comfortable for you. Use music or television while you are exercising so that the exercises are a pleasant break in your day. This  will make your life better with the exercises acting as a break in routine you can look forward to.  Lying on your back, slowly slide your foot toward your buttocks, raising your knee up off the floor. Then slowly slide your foot back down until your leg is straight again.  Lying on your back spread your legs as far apart as you can without causing discomfort.  Lying on your side, raise your upper leg and foot straight up from the floor as far as is comfortable. Slowly lower the leg and repeat.  Lying on your back, tighten up the muscle in the front of your thigh (quadriceps muscles). You can do this by keeping your leg straight and trying to raise your heel off the floor. This helps strengthen the largest muscle supporting your knee.  Lying on your back, tighten up the muscles of your buttocks both with the legs straight and with the knee bent at a comfortable angle while keeping your heel on the floor.   IF YOU ARE TRANSFERRED TO A SKILLED REHAB FACILITY If the patient is transferred to a skilled rehab facility following release from the hospital, a list of the current medications will be  sent to the facility for the patient to continue.  When discharged from the skilled rehab facility, please have the facility set up the patient's [place] prior to being released. Also, the skilled facility will be responsible for providing the patient with their medications at time of release from the facility to include their pain medication, the muscle relaxants, and their blood thinner medication. If the patient is still at the rehab facility at time of the two week follow up appointment, the skilled rehab facility will also need to assist the patient in arranging follow up appointment in our office and any transportation needs.  MAKE SURE YOU:  Understand these instructions.  Get help right away if you are not doing well or get worse.    Pick up stool softner and laxative for home use following surgery while on pain medications. Do not submerge incision under water. Please use good hand washing techniques while changing dressing each day. May shower starting three days after surgery. Please use a clean towel to pat the incision dry following showers. Continue to use ice for pain and swelling after surgery. Do not use any lotions or creams on the incision until instructed by your surgeon.  Information on my medicine - XARELTO (Rivaroxaban)  This medication education was reviewed with me or my healthcare representative as part of my discharge preparation.  The pharmacist that spoke with me during my hospital stay was:  Calyb Mcquarrie A, RPH  Why was Xarelto prescribed for you? Xarelto was prescribed for you to reduce the risk of blood clots forming after orthopedic surgery. The medical term for these abnormal blood clots is venous thromboembolism (VTE).  What do you need to know about xarelto ? Take your Xarelto ONCE DAILY at the same time every day. You may take it either with or without food.  If you have difficulty swallowing the tablet whole, you may crush it  and mix in applesauce just prior to taking your dose.  Take Xarelto exactly as prescribed by your doctor and DO NOT stop taking Xarelto without talking to the doctor who prescribed the medication.  Stopping without other VTE prevention medication to take the place of Xarelto may increase your risk of developing a clot.  After discharge, you should have regular  check-up appointments with your healthcare provider that is prescribing your Xarelto.    What do you do if you miss a dose? If you miss a dose, take it as soon as you remember on the same day then continue your regularly scheduled once daily regimen the next day. Do not take two doses of Xarelto on the same day.   Important Safety Information A possible side effect of Xarelto is bleeding. You should call your healthcare provider right away if you experience any of the following: ? Bleeding from an injury or your nose that does not stop. ? Unusual colored urine (red or dark brown) or unusual colored stools (red or black). ? Unusual bruising for unknown reasons. ? A serious fall or if you hit your head (even if there is no bleeding).  Some medicines may interact with Xarelto and might increase your risk of bleeding while on Xarelto. To help avoid this, consult your healthcare provider or pharmacist prior to using any new prescription or non-prescription medications, including herbals, vitamins, non-steroidal anti-inflammatory drugs (NSAIDs) and supplements.  This website has more information on Xarelto: [email or web address]

## 2016-01-29 NOTE — NC FL2 (Signed)
[place] LEVEL OF CARE SCREENING TOOL     IDENTIFICATION  Patient Name: Maria Marquez Birthdate: 31-May-1946 Sex: female Admission Date (Current Location): 01/28/2016  [facility] and [place] Number:  Herbalist and Address:  [facility],  [place] [address] [place]      Provider Number: [id number]  Attending Physician Name and Address:  [name], MD  Relative Name and Phone Number:       Current Level of Care: Hospital Recommended Level of Care: [place] Prior Approval Number:    Date Approved/Denied:   PASRR Number: [id number]  Discharge Plan: Home    Current Diagnoses: Patient Active Problem List   Diagnosis Date Noted  . OA (osteoarthritis) of hip 01/28/2016    Orientation RESPIRATION BLADDER Height & Weight     Self, Time, Situation, Place  Normal Continent Weight: 107.049 kg (236 lb) Height:  '5\' 6"'$  (167.6 cm)  BEHAVIORAL SYMPTOMS/MOOD NEUROLOGICAL BOWEL NUTRITION STATUS  Other (Comment) (No Behaviors)   Continent Diet  AMBULATORY STATUS COMMUNICATION OF NEEDS Skin     Verbally Surgical wounds                       Personal Care Assistance Level of Assistance              Functional Limitations Info             SPECIAL CARE FACTORS FREQUENCY  PT (By licensed PT), OT (By licensed OT)     PT Frequency: 5 x wk OT Frequency: 5 x wk            Contractures Contractures Info: Not present    Additional Factors Info  Code Status Code Status Info: Full Code             Current Medications (01/29/2016):  This is the current hospital active medication list Current Facility-Administered Medications  Medication Dose Route Frequency Provider Last Rate Last Dose  . 0.9 %  sodium chloride infusion   Intravenous Continuous [name], MD 75 mL/hr at 01/29/16 [phone]    . acetaminophen (TYLENOL) tablet 650 mg  650 mg Oral Q6H PRN [name], MD       Or  . acetaminophen  (TYLENOL) suppository 650 mg  650 mg Rectal Q6H PRN [name], MD      . acetaminophen (TYLENOL) tablet 1,000 mg  1,000 mg Oral Q6H [name], MD   1,000 mg at 01/29/16 6256  . bisacodyl (DULCOLAX) suppository 10 mg  10 mg Rectal Daily PRN [name], MD      . dexamethasone (DECADRON) injection 10 mg  10 mg Intravenous Once [name], MD      . diphenhydrAMINE (BENADRYL) 12.5 MG/5ML elixir 12.5-25 mg  12.5-25 mg Oral Q4H PRN [name], MD      . docusate sodium (COLACE) capsule 100 mg  100 mg Oral BID [name], MD   100 mg at 01/28/16 2158  . losartan (COZAAR) tablet 50 mg  50 mg Oral Daily Terri L Green, RPH       And  . hydrochlorothiazide (MICROZIDE) capsule 12.5 mg  12.5 mg Oral Daily Terri L Green, RPH      . levothyroxine (SYNTHROID, LEVOTHROID) tablet 137 mcg  137 mcg Oral QHS [name], MD   137 mcg at 01/28/16 2158  . menthol-cetylpyridinium (CEPACOL) lozenge 3 mg  1 lozenge Oral PRN [name], MD  Or  . phenol (CHLORASEPTIC) mouth spray 1 spray  1 spray Mouth/Throat PRN [name], MD      . methocarbamol (ROBAXIN) tablet 500 mg  500 mg Oral Q6H PRN [name], MD       Or  . methocarbamol (ROBAXIN) 500 mg in dextrose 5 % 50 mL IVPB  500 mg Intravenous Q6H PRN [name], MD   500 mg at 01/28/16 1450  . metoCLOPramide (REGLAN) tablet 5-10 mg  5-10 mg Oral Q8H PRN [name], MD       Or  . metoCLOPramide (REGLAN) injection 5-10 mg  5-10 mg Intravenous Q8H PRN [name], MD      . morphine 2 MG/ML injection 1 mg  1 mg Intravenous Q2H PRN [name], MD   1 mg at 01/28/16 1636  . ondansetron (ZOFRAN) tablet 4 mg  4 mg Oral Q6H PRN [name], MD       Or  . ondansetron [facility]) injection 4 mg  4 mg Intravenous Q6H PRN [name], MD      . oxyCODONE (Oxy IR/ROXICODONE) immediate release tablet 5-10 mg  5-10 mg Oral Q3H PRN [name], MD   10 mg at 01/29/16 0630  . pantoprazole (PROTONIX) EC tablet 40 mg  40 mg Oral  Daily [name], MD      . polyethylene glycol (MIRALAX / GLYCOLAX) packet 17 g  17 g Oral Daily PRN [name], MD      . rivaroxaban Alveda Reasons) tablet 10 mg  10 mg Oral Q breakfast [name], MD   10 mg at 01/29/16 9323  . rosuvastatin (CRESTOR) tablet 10 mg  10 mg Oral QHS [name], MD   10 mg at 01/28/16 2158  . sodium phosphate (FLEET) 7-19 GM/118ML enema 1 enema  1 enema Rectal Once PRN [name], MD      . traMADol Veatrice Bourbon) tablet 50-100 mg  50-100 mg Oral Q6H PRN [name], MD         Discharge Medications: Please see discharge summary for a list of discharge medications.  Relevant Imaging Results:  Relevant Lab Results:   Additional Information SS # [id number]  Nanea Jared, Randall An, LCSW

## 2016-01-29 NOTE — Progress Notes (Signed)
Occupational Therapy Evaluation Patient Details Name: Maria Marquez MRN: [id number] DOB: 10/14/1945 Today's Date: 01/29/2016    History of Present Illness s/p RIGHT TOTAL HIP ARTHROPLASTY ANTERIOR APPROACH (Right)   Clinical Impression   Patient presents to OT with decreased ADL independence and safety s/p R THA. Would benefit from skilled OT to maximize function and to facilitate a safe discharge. OT will follow.    Follow Up Recommendations  SNF;Supervision/Assistance - 24 hour    Equipment Recommendations  3 in 1 bedside comode;Tub/shower bench    Recommendations for Other Services       Precautions / Restrictions Precautions Precautions: Fall Restrictions Weight Bearing Restrictions: No Other Position/Activity Restrictions: WBAT      Mobility Bed Mobility Overal bed mobility: Needs Assistance Bed Mobility: Supine to Sit     Supine to sit: Min assist;HOB elevated     General bed mobility comments: cues for technique; assist for RLE and shoulder positioning  Transfers Overall transfer level: Needs assistance Equipment used: Rolling walker (2 wheeled) Transfers: Sit to/from Stand Sit to Stand: Min assist;From elevated surface         General transfer comment: cues for hand placement/technique; assist to rise    Balance                                            ADL Overall ADL's : Needs assistance/impaired Eating/Feeding: Independent;Bed level   Grooming: Set up;Sitting           Upper Body Dressing : Minimal assistance;Sitting   Lower Body Dressing: Maximal assistance;Sit to/from stand   Toilet Transfer: Minimal Patent examiner Details (indicate cue type and reason): simulated based on standing from bed and sitting in recliner at ned of session         Functional mobility during ADLs: Minimal assistance;Rolling walker;Cueing for sequencing General ADL Comments: Patient reports she has no one to help her at  home. She prefers to go home but will go to SNF if that is recommended. Patient is having difficulty with basic ADLs today. If she does not progress, feel home alone would not be the safest d/c plan. OT will follow.     Vision     Perception     Praxis      Pertinent Vitals/Pain Pain Assessment: 0-10 Pain Score: 3  Pain Location: R hip Pain Descriptors / Indicators: Burning;Tightness Pain Intervention(s): Limited activity within patient's tolerance;Monitored during session;Repositioned;Ice applied     Hand Dominance Right   Extremity/Trunk Assessment Upper Extremity Assessment Upper Extremity Assessment: Overall WFL for tasks assessed   Lower Extremity Assessment Lower Extremity Assessment: Defer to PT evaluation   Cervical / Trunk Assessment Cervical / Trunk Assessment: Normal   Communication Communication Communication: No difficulties   Cognition Arousal/Alertness: Awake/alert Behavior During Therapy: WFL for tasks assessed/performed Overall Cognitive Status: Within Functional Limits for tasks assessed                     General Comments       Exercises       Shoulder Instructions      Home Living Family/patient expects to be discharged to:: Private residence Living Arrangements: Alone Available Help at Discharge: Family;Available PRN/intermittently Type of Home: House Home Access: Stairs to enter [employer] of Steps: 2 Entrance Stairs-Rails: None Home Layout: One level     Bathroom Shower/Tub: Tub/shower  unit   Bathroom Toilet: Standard Bathroom Accessibility: Yes How Accessible: Accessible via walker Home Equipment: [place] - 4 wheels;Cane - single point          Prior Functioning/Environment Level of Independence: Independent             OT Diagnosis: Acute pain   OT Problem List: Decreased strength;Decreased range of motion;Decreased activity tolerance;Decreased knowledge of use of DME or AE;Pain   OT  Treatment/Interventions: Self-care/ADL training;DME and/or AE instruction;Therapeutic activities;Patient/family education    OT Goals(Current goals can be found in the care plan section) Acute Rehab OT Goals Patient Stated Goal: to go home OT Goal Formulation: With patient Time For Goal Achievement: 02/12/16 Potential to Achieve Goals: Good ADL Goals Pt Will Perform Lower Body Bathing: with supervision;with adaptive equipment;sit to/from stand Pt Will Perform Lower Body Dressing: with supervision;with adaptive equipment;sit to/from stand Pt Will Transfer to Toilet: with supervision;ambulating;bedside commode Pt Will Perform Toileting - Clothing Manipulation and hygiene: with supervision;sit to/from stand Pt Will Perform Tub/Shower Transfer: Tub transfer;with supervision;ambulating;tub bench;rolling walker  OT Frequency: Min 2X/week   Barriers to D/C: Decreased caregiver support  lives alone       Co-evaluation PT/OT/SLP Co-Evaluation/Treatment: Yes Reason for Co-Treatment: For patient/therapist safety PT goals addressed during session: Mobility/safety with mobility OT goals addressed during session: ADL's and self-care      End of Session Equipment Utilized During Treatment: Rolling walker Nurse Communication: Mobility status  Activity Tolerance: Patient tolerated treatment well Patient left: in chair;with call bell/phone within reach;with chair alarm set   Time: [phone] OT Time Calculation (min): 39 min Charges:  OT General Charges $OT Visit: 1 Procedure OT Evaluation $OT Eval Low Complexity: 1 Procedure G-Codes:    Nikisha Fleece A Feb 19, 2016, 9:58 AM

## 2016-01-29 NOTE — Progress Notes (Signed)
   Subjective: 1 Day Post-Op Procedure(s) (LRB): RIGHT TOTAL HIP ARTHROPLASTY ANTERIOR APPROACH (Right) Patient reports pain as moderate.   Patient seen in rounds with Dr. Wynelle Link. Patient is well, and has had no acute complaints or problems. Has some discomfort but well-controlled with pain medications. No issues overnight.  Plan is to go Home after hospital stay.  Objective: Vital signs in last 24 hours: Temp:  [87.7 F (30.9 C)-98.6 F (37 C)] 98.5 F (36.9 C) (06/08 0624) Pulse Rate:  [57-80] 65 (06/08 0624) Resp:  [11-18] 16 (06/08 0624) BP: (103-175)/(49-73) 113/55 mmHg (06/08 0624) SpO2:  [95 %-100 %] 95 % (06/08 0624) Weight:  [107.049 kg (236 lb)] 107.049 kg (236 lb) (06/07 1126)  Intake/Output from previous day:  Intake/Output Summary (Last 24 hours) at 01/29/16 0811 Last data filed at 01/29/16 0641  Gross per 24 hour  Intake 2642.5 ml  Output   1755 ml  Net  887.5 ml     Labs:  Recent Labs  01/29/16 0421  HGB 11.3*    Recent Labs  01/29/16 0421  WBC 16.1*  RBC 3.66*  HCT 34.1*  PLT 252    Recent Labs  01/29/16 0421  NA 138  K 4.1  CL 107  CO2 25  BUN 18  CREATININE 1.04*  GLUCOSE 156*  CALCIUM 8.4*    EXAM General - Patient is Alert and Oriented Extremity - Intact pulses distally Dorsiflexion/Plantar flexion intact No cellulitis present Compartment soft Dressing - dressing C/D/I Motor Function - intact, moving foot and toes well on exam.  Hemovac pulled without difficulty.  Past Medical History  Diagnosis Date  . Hypertension   . Hypothyroid   . GERD (gastroesophageal reflux disease)   . Arthritis     Assessment/Plan: 1 Day Post-Op Procedure(s) (LRB): RIGHT TOTAL HIP ARTHROPLASTY ANTERIOR APPROACH (Right) Principal Problem:   OA (osteoarthritis) of hip  Estimated body mass index is 38.11 kg/(m^2) as calculated from the following:   Height as of this encounter: '5\' 6"'$  (1.676 m).   Weight as of this encounter: 107.049 kg  (236 lb). Advance diet Up with therapy D/C IV fluids  DVT Prophylaxis - Xarelto Weight Bearing As Tolerated  D/C Knee Immobilizer Hemovac Pulled Begin Therapy  PT today. Plan for DC home with HHPT tomorrow.   Ardeen Jourdain, PA-C Orthopaedic Surgery 01/29/2016, 8:11 AM

## 2016-01-29 NOTE — Evaluation (Signed)
Physical Therapy Evaluation Patient Details Name: Maria Marquez MRN: [id number] DOB: 16-Dec-1945 Today's Date: 01/29/2016   History of Present Illness  s/p RIGHT TOTAL HIP ARTHROPLASTY ANTERIOR APPROACH (Right)  Clinical Impression  Pt s/p R THR presents with decreased R LE strength/ROM and post op pain limiting functional mobility.  Pt would benefit from follow up rehab at SNF level to maximize IND and safety prior to return home with very ltd assist.    Follow Up Recommendations SNF    Equipment Recommendations  Rolling walker with 5" wheels    Recommendations for Other Services OT consult     Precautions / Restrictions Precautions Precautions: Fall Restrictions Weight Bearing Restrictions: No Other Position/Activity Restrictions: WBAT      Mobility  Bed Mobility Overal bed mobility: Needs Assistance Bed Mobility: Supine to Sit     Supine to sit: Min assist;HOB elevated     General bed mobility comments: cues for technique; assist for RLE and shoulder positioning  Transfers Overall transfer level: Needs assistance Equipment used: Rolling walker (2 wheeled) Transfers: Sit to/from Stand Sit to Stand: Min assist;From elevated surface         General transfer comment: cues for hand placement/technique; assist to rise  Ambulation/Gait Ambulation/Gait assistance: Min assist Ambulation Distance (Feet): 28 Feet Assistive device: Rolling walker (2 wheeled) Gait Pattern/deviations: Step-to pattern;Decreased step length - right;Decreased step length - left;Shuffle;Trunk flexed Gait velocity: decr Gait velocity interpretation: Below normal speed for age/gender General Gait Details: cues for sequence, posture and position from RW.  Distance ltd by onset nausea  Stairs            Wheelchair Mobility    Modified Rankin (Stroke Patients Only)       Balance                                             Pertinent Vitals/Pain Pain Assessment:  0-10 Pain Score: 3  Pain Location: R hip Pain Descriptors / Indicators: Aching;Sore Pain Intervention(s): Limited activity within patient's tolerance;Monitored during session;Premedicated before session;Ice applied    Home Living Family/patient expects to be discharged to:: Private residence Living Arrangements: Alone Available Help at Discharge: Family;Available PRN/intermittently Type of Home: House Home Access: Stairs to enter Entrance Stairs-Rails: None Entrance Stairs-Number of Steps: 2 Home Layout: One level Home Equipment: Walker - 4 wheels;Cane - single point      Prior Function Level of Independence: Independent               Hand Dominance   Dominant Hand: Right    Extremity/Trunk Assessment   Upper Extremity Assessment: Overall WFL for tasks assessed           Lower Extremity Assessment: RLE deficits/detail      Cervical / Trunk Assessment: Normal  Communication   Communication: No difficulties  Cognition Arousal/Alertness: Awake/alert Behavior During Therapy: WFL for tasks assessed/performed Overall Cognitive Status: Within Functional Limits for tasks assessed                      General Comments      Exercises Total Joint Exercises Ankle Circles/Pumps: AROM;Both;15 reps;Supine      Assessment/Plan    PT Assessment Patient needs continued PT services  PT Diagnosis Difficulty walking   PT Problem List Decreased strength;Decreased range of motion;Decreased activity tolerance;Decreased mobility;Decreased knowledge of use of DME;Obesity;Pain  PT  Treatment Interventions DME instruction;Gait training;Functional mobility training;Stair training;Therapeutic activities;Therapeutic exercise;Patient/family education   PT Goals (Current goals can be found in the Care Plan section) Acute Rehab PT Goals Patient Stated Goal: to go home PT Goal Formulation: With patient Time For Goal Achievement: 02/04/16 Potential to Achieve Goals:  Good    Frequency 7X/week   Barriers to discharge Decreased caregiver support Home alone with very ltd family assist    Co-evaluation   Reason for Co-Treatment: For patient/therapist safety PT goals addressed during session: Mobility/safety with mobility OT goals addressed during session: ADL's and self-care       End of Session Equipment Utilized During Treatment: Gait belt Activity Tolerance: Other (comment) (nausea) Patient left: in chair;with call bell/phone within reach;with chair alarm set Nurse Communication: Mobility status         Time: [phone] PT Time Calculation (min) (ACUTE ONLY): 40 min   Charges:   PT Evaluation $PT Eval Low Complexity: 1 Procedure PT Treatments $Gait Training: 8-22 mins   PT G Codes:        Maria Marquez 02-Feb-2016, 11:53 AM

## 2016-01-29 NOTE — Progress Notes (Signed)
Physical Therapy Treatment Patient Details Name: Maria Marquez MRN: [id number] DOB: 06-12-1946 Today's Date: 01/29/2016    History of Present Illness s/p RIGHT TOTAL HIP ARTHROPLASTY ANTERIOR APPROACH (Right)    PT Comments    Steady progress with mobility but struggling with transfers to/from bed  Follow Up Recommendations  SNF     Equipment Recommendations  Rolling walker with 5" wheels    Recommendations for Other Services OT consult     Precautions / Restrictions Precautions Precautions: Fall Restrictions Weight Bearing Restrictions: No Other Position/Activity Restrictions: WBAT    Mobility  Bed Mobility Overal bed mobility: Needs Assistance Bed Mobility: Supine to Sit       Sit to supine: Min assist;Mod assist   General bed mobility comments: cues for technique; assist for RLE   Transfers Overall transfer level: Needs assistance Equipment used: Rolling walker (2 wheeled) Transfers: Sit to/from Stand Sit to Stand: Min assist;From elevated surface         General transfer comment: cues for hand placement/technique; assist to rise and steady.  Pt from chair, to EOB and to/from [facility]  Ambulation/Gait Ambulation/Gait assistance: Min assist Ambulation Distance (Feet): 24 Feet Assistive device: Rolling walker (2 wheeled) Gait Pattern/deviations: Step-to pattern;Decreased step length - right;Decreased step length - left;Shuffle;Trunk flexed Gait velocity: decr   General Gait Details: cues for sequence, posture and position from RW.   Stairs            Wheelchair Mobility    Modified Rankin (Stroke Patients Only)       Balance                                    Cognition Arousal/Alertness: Awake/alert Behavior During Therapy: WFL for tasks assessed/performed Overall Cognitive Status: Within Functional Limits for tasks assessed                      Exercises Total Joint Exercises Ankle Circles/Pumps: AROM;Both;15  reps;Supine Quad Sets: AROM;Both;10 reps;Supine Heel Slides: AAROM;Right;20 reps;Supine Hip ABduction/ADduction: AAROM;Right;15 reps;Supine    General Comments        Pertinent Vitals/Pain Pain Assessment: 0-10 Pain Score: 5  Pain Location: R hip with transfer to bed Pain Descriptors / Indicators: Aching;Sore Pain Intervention(s): Limited activity within patient's tolerance;Monitored during session;Premedicated before session;Ice applied (Tyelenol only)    Home Living                      Prior Function            PT Goals (current goals can now be found in the care plan section) Acute Rehab PT Goals Patient Stated Goal: to go home PT Goal Formulation: With patient Time For Goal Achievement: 02/04/16 Potential to Achieve Goals: Good Progress towards PT goals: Progressing toward goals    Frequency  7X/week    PT Plan Current plan remains appropriate    Co-evaluation             End of Session Equipment Utilized During Treatment: Gait belt Activity Tolerance: Patient tolerated treatment well Patient left: in bed;with call bell/phone within reach;with bed alarm set     Time: 1710-1738 PT Time Calculation (min) (ACUTE ONLY): 28 min  Charges:  $Gait Training: 23-37 mins $Therapeutic Exercise: 8-22 mins $Therapeutic Activity: 8-22 mins                    G  Codes:      Lisenbee,Tammie Ellsworth 01/29/2016, 5:48 PM

## 2016-01-29 NOTE — Care Management Note (Signed)
Case Management Note  Patient Details  Name: Maria Marquez MRN: [id number] Date of Birth: 01/20/1946  Subjective/Objective:                  RIGHT TOTAL HIP ARTHROPLASTY ANTERIOR APPROACH (Right) Action/Plan: Discharge planning Expected Discharge Date:                  Expected Discharge Plan:  [place]  In-House Referral:     Discharge planning Services  CM Consult  Post Acute Care Choice:    Choice offered to:  Patient  DME Arranged:    DME Agency:     HH Arranged:    [place] Agency:     Status of Service:  Completed, signed off  Medicare Important Message Given:    Date Medicare IM Given:    Medicare IM give by:    Date Additional Medicare IM Given:    Additional Medicare Important Message give by:     If discussed at [place] of Stay Meetings, dates discussed:    Additional Comments: Cm met with pt in room to discuss discharge.  Pt confirms plan is for SNF.  CSW aware and arranging.  NO other CM needs were communicated. Dellie Catholic, RN 01/29/2016, 12:24 PM

## 2016-01-30 LAB — CBC
HCT: 31.9 % — ABNORMAL LOW (ref 36.0–46.0)
Hemoglobin: 10.5 g/dL — ABNORMAL LOW (ref 12.0–15.0)
MCH: 30.9 pg (ref 26.0–34.0)
MCHC: 32.9 g/dL (ref 30.0–36.0)
MCV: 93.8 fL (ref 78.0–100.0)
Platelets: 233 10*3/uL (ref 150–400)
RBC: 3.4 MIL/uL — ABNORMAL LOW (ref 3.87–5.11)
RDW: 13.9 % (ref 11.5–15.5)
WBC: 16.9 10*3/uL — ABNORMAL HIGH (ref 4.0–10.5)

## 2016-01-30 LAB — BASIC METABOLIC PANEL
Anion gap: 6 (ref 5–15)
BUN: 21 mg/dL — ABNORMAL HIGH (ref 6–20)
CO2: 26 mmol/L (ref 22–32)
Calcium: 8.3 mg/dL — ABNORMAL LOW (ref 8.9–10.3)
Chloride: 108 mmol/L (ref 101–111)
Creatinine, Ser: 0.93 mg/dL (ref 0.44–1.00)
GFR calc Af Amer: >60 mL/min (ref >60)
GFR calc non Af Amer: >60 mL/min (ref >60)
Glucose, Bld: 136 mg/dL — ABNORMAL HIGH (ref 65–99)
Potassium: 4 mmol/L (ref 3.5–5.1)
Sodium: 140 mmol/L (ref 135–145)

## 2016-01-30 LAB — TYPE AND SCREEN
ABO/RH(D): O POS
Antibody Screen: NEGATIVE

## 2016-01-30 NOTE — Progress Notes (Signed)
Occupational Therapy Treatment Patient Details Name: Maria Marquez MRN: [id number] DOB: 1945/09/30 Today's Date: 01/30/2016    History of present illness s/p RIGHT TOTAL HIP ARTHROPLASTY ANTERIOR APPROACH (Right)   OT comments  Pt practiced with AE this session and did well.  Would benefit from reinforcement with this  Follow Up Recommendations  SNF (plans [facility])    Equipment Recommendations  3 in 1 bedside comode    Recommendations for Other Services      Precautions / Restrictions Precautions Precautions: Fall Restrictions Weight Bearing Restrictions: No Other Position/Activity Restrictions: WBAT       Mobility Bed Mobility         Supine to sit: Supervision     General bed mobility comments: with leg lifter  Transfers   Equipment used: Rolling walker (2 wheeled)   Sit to Stand: Min guard;From elevated surface         General transfer comment: cues for UE/LE placement.  Min guard for safety    Balance                                   ADL                       Lower Body Dressing: Min guard;With adaptive equipment;Sit to/from stand                 General ADL Comments: practiced with sock aide and reacher:  donned underwear and socks.  Pt also used leg lifter for OOB. Maria Marquez asked for resources, which I provided. Plan is for SNF--recommended Maria Marquez wait and get any AE after rehab as Maria Marquez may not need these anymore. Reinforced working within her pain tolerance.      Vision                     Perception     Praxis      Cognition   Behavior During Therapy: WFL for tasks assessed/performed Overall Cognitive Status: Within Functional Limits for tasks assessed                       Extremity/Trunk Assessment               Exercises     Shoulder Instructions       General Comments      Pertinent Vitals/ Pain       Pain Score: 5  Pain Location: R hip Pain Descriptors / Indicators:  Tightness Pain Intervention(s): Limited activity within patient's tolerance;Monitored during session;Premedicated before session;Repositioned;Ice applied  Home Living                                          Prior Functioning/Environment              Frequency       Progress Toward Goals  OT Goals(current goals can now be found in the care plan section)  Progress towards OT goals: Progressing toward goals  Acute Rehab OT Goals Patient Stated Goal: to go home  Plan      Co-evaluation                 End of Session     Activity Tolerance Patient tolerated treatment well   Patient Left  in chair;with call bell/phone within reach;with chair alarm set   Nurse Communication          Time: [phone] OT Time Calculation (min): 30 min  Charges: OT General Charges $OT Visit: 1 Procedure OT Treatments $Self Care/Home Management : 23-37 mins  Chord Takahashi 01/30/2016, 8:46 AM  Lesle Chris, OTR/L [phone] 01/30/2016

## 2016-01-30 NOTE — Progress Notes (Signed)
   Subjective: 2 Days Post-Op Procedure(s) (LRB): RIGHT TOTAL HIP ARTHROPLASTY ANTERIOR APPROACH (Right) Patient reports pain as mild.   Patient seen in rounds with Dr. Wynelle Link. Patient is well, and has had no acute complaints or problems. No SOB or chest pain. No issues overnight.  Objective: Vital signs in last 24 hours: Temp:  [97.9 F (36.6 C)-98.2 F (36.8 C)] 98.2 F (36.8 C) (06/09 0622) Pulse Rate:  [55-74] 59 (06/09 0622) Resp:  [16-18] 18 (06/09 0622) BP: (115-135)/(49-66) 135/53 mmHg (06/09 0622) SpO2:  [94 %-98 %] 96 % (06/09 0622)  Intake/Output from previous day:  Intake/Output Summary (Last 24 hours) at 01/30/16 0703 Last data filed at 01/30/16 0600  Gross per 24 hour  Intake   2850 ml  Output    925 ml  Net   1925 ml     Labs:  Recent Labs  01/29/16 0421 01/30/16 0405  HGB 11.3* 10.5*    Recent Labs  01/29/16 0421 01/30/16 0405  WBC 16.1* 16.9*  RBC 3.66* 3.40*  HCT 34.1* 31.9*  PLT 252 233    Recent Labs  01/29/16 0421 01/30/16 0405  NA 138 140  K 4.1 4.0  CL 107 108  CO2 25 26  BUN 18 21*  CREATININE 1.04* 0.93  GLUCOSE 156* 136*  CALCIUM 8.4* 8.3*    EXAM General - Patient is Alert and Oriented Extremity - Neurologically intact Intact pulses distally Dorsiflexion/Plantar flexion intact No cellulitis present Compartment soft Dressing/Incision - clean, dry, no drainage Motor Function - intact, moving foot and toes well on exam.   Past Medical History  Diagnosis Date  . Hypertension   . Hypothyroid   . GERD (gastroesophageal reflux disease)   . Arthritis     Assessment/Plan: 2 Days Post-Op Procedure(s) (LRB): RIGHT TOTAL HIP ARTHROPLASTY ANTERIOR APPROACH (Right) Principal Problem:   OA (osteoarthritis) of hip  Estimated body mass index is 38.11 kg/(m^2) as calculated from the following:   Height as of this encounter: '5\' 6"'$  (1.676 m).   Weight as of this encounter: 107.049 kg (236 lb). Advance diet Up with  therapy Discharge to SNF  DVT Prophylaxis - Xarelto Weight Bearing As Tolerated   Progressed fair yesterday with ambulating but poor transfers. Continue PT today before DC to SNF.   Ardeen Jourdain, PA-C Orthopaedic Surgery 01/30/2016, 7:03 AM

## 2016-01-30 NOTE — Clinical Social Work Note (Signed)
Authorization ([id number]) has been received.  CSW informed pt's family and facility.  CSW sent d/c documentation to facilty.  Pt will be transported to [facility] via car.  CSW will provide facility information to RN in order to call report.  CSW signing off as there are no further needs at this time.  [place], [place]

## 2016-01-30 NOTE — Progress Notes (Signed)
Called and gave report to [place] at [facility]. Answered all questions and concerns. Pt will be transported by daughter via private vehicle. Discharge Packet given to daughter.

## 2016-01-30 NOTE — Progress Notes (Addendum)
Physical Therapy Treatment Patient Details Name: Maria Marquez MRN: [id number] DOB: September 16, 1945 Today's Date: 01/30/2016    History of Present Illness s/p RIGHT TOTAL HIP ARTHROPLASTY ANTERIOR APPROACH (Right)    PT Comments    POD # 2 Pt progressing slowly.  Increased c/o edema R thigh and pain "more today".  Still requires assist for gait. Pt will need ST Rehab at SNF prior to returning home alone.   Follow Up Recommendations  SNF     Equipment Recommendations       Recommendations for Other Services       Precautions / Restrictions Precautions Precautions: Fall Restrictions Weight Bearing Restrictions: No Other Position/Activity Restrictions: WBAT    Mobility  Bed Mobility               General bed mobility comments: OOB in recliner  Transfers Overall transfer level: Needs assistance Equipment used: Rolling walker (2 wheeled) Transfers: Sit to/from Stand Sit to Stand: Min guard         General transfer comment: increased time and one VC safety with turns  Ambulation/Gait Ambulation/Gait assistance: Min assist Ambulation Distance (Feet): 28 Feet Assistive device: Rolling walker (2 wheeled) Gait Pattern/deviations: Step-to pattern;Decreased stance time - right Gait velocity: decr   General Gait Details: cues for sequence, posture and position from RW.  Increased c/o edema R thigh   Stairs            Wheelchair Mobility    Modified Rankin (Stroke Patients Only)       Balance                                    Cognition Arousal/Alertness: Awake/alert Behavior During Therapy: WFL for tasks assessed/performed Overall Cognitive Status: Within Functional Limits for tasks assessed                      Exercises  15 reps B AP 20 reps B knee presses along with gluteal squeezes    General Comments        Pertinent Vitals/Pain Pain Assessment: 0-10 Pain Score: 5  Pain Location: R hip Pain Descriptors /  Indicators: Tightness;Tender Pain Intervention(s): Monitored during session;Premedicated before session;Repositioned;Ice applied    Home Living                      Prior Function            PT Goals (current goals can now be found in the care plan section) Progress towards PT goals: Progressing toward goals    Frequency  7X/week    PT Plan Current plan remains appropriate    Co-evaluation             End of Session Equipment Utilized During Treatment: Gait belt Activity Tolerance: Patient tolerated treatment well Patient left: in chair;with call bell/phone within reach     Time: 1206-1232 PT Time Calculation (min) (ACUTE ONLY): 26 min  Charges:  $Gait Training: 8-22 mins $Therapeutic Exercise: 8-22 mins                    G Codes:      Rica Koyanagi  PTA [facility]  Acute  Rehab Pager      [phone]

## 2016-01-30 NOTE — Discharge Summary (Signed)
Physician Discharge Summary   Patient ID: Maria Marquez MRN: [id number] DOB/AGE: [birth year] 68 y.o.  Admit date: 01/28/2016 Discharge date: 01/30/2016  Primary Diagnosis: Primary osteoarthritis right hip   Admission Diagnoses:  Past Medical History  Diagnosis Date  . Hypertension   . Hypothyroid   . GERD (gastroesophageal reflux disease)   . Arthritis    Discharge Diagnoses:   Principal Problem:   OA (osteoarthritis) of hip  Estimated body mass index is 38.11 kg/(m^2) as calculated from the following:   Height as of this encounter: 5' 6"  (1.676 m).   Weight as of this encounter: 107.049 kg (236 lb).  Procedure(s) (LRB): RIGHT TOTAL HIP ARTHROPLASTY ANTERIOR APPROACH (Right)   Consults: None  HPI: Maria Marquez, 70 y.o. female, has a history of pain and functional disability in the right hip(s) due to arthritis and patient has failed non-surgical conservative treatments for greater than 12 weeks to include NSAID's and/or analgesics, corticosteriod injections, use of assistive devices and activity modification. Onset of symptoms was gradual starting 2 years ago with gradually worsening course since that time.The patient noted no past surgery on the right hip(s). Patient currently rates pain in the right hip at 7 out of 10 with activity. Patient has night pain, worsening of pain with activity and weight bearing, pain that interfers with activities of daily living and pain with passive range of motion. Patient has evidence of periarticular osteophytes and joint space narrowing by imaging studies. This condition presents safety issues increasing the risk of falls. There is no current active infection.  Laboratory Data: Admission on 01/28/2016  Component Date Value Ref Range Status  . WBC 01/29/2016 16.1* 4.0 - 10.5 K/uL Final  . RBC 01/29/2016 3.66* 3.87 - 5.11 MIL/uL Final  . Hemoglobin 01/29/2016 11.3* 12.0 - 15.0 g/dL Final  . HCT 01/29/2016 34.1* 36.0 - 46.0 % Final  . MCV  01/29/2016 93.2  78.0 - 100.0 fL Final  . MCH 01/29/2016 30.9  26.0 - 34.0 pg Final  . MCHC 01/29/2016 33.1  30.0 - 36.0 g/dL Final  . RDW 01/29/2016 13.5  11.5 - 15.5 % Final  . Platelets 01/29/2016 252  150 - 400 K/uL Final  . Sodium 01/29/2016 138  135 - 145 mmol/L Final  . Potassium 01/29/2016 4.1  3.5 - 5.1 mmol/L Final  . Chloride 01/29/2016 107  101 - 111 mmol/L Final  . CO2 01/29/2016 25  22 - 32 mmol/L Final  . Glucose, Bld 01/29/2016 156* 65 - 99 mg/dL Final  . BUN 01/29/2016 18  6 - 20 mg/dL Final  . Creatinine, Ser 01/29/2016 1.04* 0.44 - 1.00 mg/dL Final  . Calcium 01/29/2016 8.4* 8.9 - 10.3 mg/dL Final  . GFR calc non Af Amer 01/29/2016 54* >60 mL/min Final  . GFR calc Af Amer 01/29/2016 >60  >60 mL/min Final   Comment: (NOTE) The eGFR has been calculated using the CKD EPI equation. This calculation has not been validated in all clinical situations. eGFR's persistently <60 mL/min signify possible Chronic Kidney Disease.   . Anion gap 01/29/2016 6  5 - 15 Final  . WBC 01/30/2016 16.9* 4.0 - 10.5 K/uL Final  . RBC 01/30/2016 3.40* 3.87 - 5.11 MIL/uL Final  . Hemoglobin 01/30/2016 10.5* 12.0 - 15.0 g/dL Final  . HCT 01/30/2016 31.9* 36.0 - 46.0 % Final  . MCV 01/30/2016 93.8  78.0 - 100.0 fL Final  . MCH 01/30/2016 30.9  26.0 - 34.0 pg Final  . MCHC 01/30/2016 32.9  30.0 -  36.0 g/dL Final  . RDW 01/30/2016 13.9  11.5 - 15.5 % Final  . Platelets 01/30/2016 233  150 - 400 K/uL Final  . Sodium 01/30/2016 140  135 - 145 mmol/L Final  . Potassium 01/30/2016 4.0  3.5 - 5.1 mmol/L Final  . Chloride 01/30/2016 108  101 - 111 mmol/L Final  . CO2 01/30/2016 26  22 - 32 mmol/L Final  . Glucose, Bld 01/30/2016 136* 65 - 99 mg/dL Final  . BUN 01/30/2016 21* 6 - 20 mg/dL Final  . Creatinine, Ser 01/30/2016 0.93  0.44 - 1.00 mg/dL Final  . Calcium 01/30/2016 8.3* 8.9 - 10.3 mg/dL Final  . GFR calc non Af Amer 01/30/2016 >60  >60 mL/min Final  . GFR calc Af Amer 01/30/2016 >60  >60  mL/min Final   Comment: (NOTE) The eGFR has been calculated using the CKD EPI equation. This calculation has not been validated in all clinical situations. eGFR's persistently <60 mL/min signify possible Chronic Kidney Disease.   Georgiann Hahn gap 01/30/2016 6  5 - 15 Final  Hospital Outpatient Visit on 01/21/2016  Component Date Value Ref Range Status  . aPTT 01/21/2016 31  24 - 37 seconds Final  . WBC 01/21/2016 8.7  4.0 - 10.5 K/uL Final  . RBC 01/21/2016 4.22  3.87 - 5.11 MIL/uL Final  . Hemoglobin 01/21/2016 13.2  12.0 - 15.0 g/dL Final  . HCT 01/21/2016 39.4  36.0 - 46.0 % Final  . MCV 01/21/2016 93.4  78.0 - 100.0 fL Final  . MCH 01/21/2016 31.3  26.0 - 34.0 pg Final  . MCHC 01/21/2016 33.5  30.0 - 36.0 g/dL Final  . RDW 01/21/2016 13.8  11.5 - 15.5 % Final  . Platelets 01/21/2016 283  150 - 400 K/uL Final  . Sodium 01/21/2016 140  135 - 145 mmol/L Final  . Potassium 01/21/2016 3.2* 3.5 - 5.1 mmol/L Final  . Chloride 01/21/2016 106  101 - 111 mmol/L Final  . CO2 01/21/2016 28  22 - 32 mmol/L Final  . Glucose, Bld 01/21/2016 102* 65 - 99 mg/dL Final  . BUN 01/21/2016 15  6 - 20 mg/dL Final  . Creatinine, Ser 01/21/2016 1.03* 0.44 - 1.00 mg/dL Final  . Calcium 01/21/2016 9.1  8.9 - 10.3 mg/dL Final  . Total Protein 01/21/2016 7.1  6.5 - 8.1 g/dL Final  . Albumin 01/21/2016 4.2  3.5 - 5.0 g/dL Final  . AST 01/21/2016 18  15 - 41 U/L Final  . ALT 01/21/2016 19  14 - 54 U/L Final  . Alkaline Phosphatase 01/21/2016 58  38 - 126 U/L Final  . Total Bilirubin 01/21/2016 0.9  0.3 - 1.2 mg/dL Final  . GFR calc non Af Amer 01/21/2016 55* >60 mL/min Final  . GFR calc Af Amer 01/21/2016 >60  >60 mL/min Final   Comment: (NOTE) The eGFR has been calculated using the CKD EPI equation. This calculation has not been validated in all clinical situations. eGFR's persistently <60 mL/min signify possible Chronic Kidney Disease.   . Anion gap 01/21/2016 6  5 - 15 Final  . Prothrombin Time  01/21/2016 14.0  11.6 - 15.2 seconds Final  . INR 01/21/2016 1.10  0.00 - 1.49 Final  . ABO/RH(D) 01/21/2016 O POS   Final  . Antibody Screen 01/21/2016 NEG   Final  . Sample Expiration 01/21/2016 02/04/2016   Final  . Extend sample reason 01/21/2016 NO TRANSFUSIONS OR PREGNANCY IN THE PAST 3 MONTHS   Final  .  Color, Urine 01/21/2016 YELLOW  YELLOW Final  . APPearance 01/21/2016 CLEAR  CLEAR Final  . Specific Gravity, Urine 01/21/2016 1.008  1.005 - 1.030 Final  . pH 01/21/2016 5.5  5.0 - 8.0 Final  . Glucose, UA 01/21/2016 NEGATIVE  NEGATIVE mg/dL Final  . Hgb urine dipstick 01/21/2016 LARGE* NEGATIVE Final  . Bilirubin Urine 01/21/2016 NEGATIVE  NEGATIVE Final  . Ketones, ur 01/21/2016 NEGATIVE  NEGATIVE mg/dL Final  . Protein, ur 01/21/2016 NEGATIVE  NEGATIVE mg/dL Final  . Nitrite 01/21/2016 NEGATIVE  NEGATIVE Final  . Leukocytes, UA 01/21/2016 NEGATIVE  NEGATIVE Final  . MRSA, PCR 01/21/2016 NEGATIVE  NEGATIVE Final  . Staphylococcus aureus 01/21/2016 NEGATIVE  NEGATIVE Final   Comment:        The Xpert SA Assay (FDA approved for NASAL specimens in patients over 66 years of age), is one component of a comprehensive surveillance program.  Test performance has been validated by [facility] for patients greater than or equal to 101 year old. It is not intended to diagnose infection nor to guide or monitor treatment.   . Squamous Epithelial / LPF 01/21/2016 0-5* NONE SEEN Final  . WBC, UA 01/21/2016 0-5  0 - 5 WBC/hpf Final  . RBC / HPF 01/21/2016 6-30  0 - 5 RBC/hpf Final  . Bacteria, UA 01/21/2016 RARE* NONE SEEN Final  . ABO/RH(D) 01/21/2016 O POS   Final     X-Rays:Dg Pelvis Portable  01/28/2016  CLINICAL DATA:  Post RIGHT hip replacement EXAM: PORTABLE PELVIS 1-2 VIEWS COMPARISON:  Portable exam 1415 hours without priors for comparison FINDINGS: Fluoroscopy time not provided, please referred operative records. RIGHT hip prosthesis identified in expected position. Surgical  drain present. No acute fracture, dislocation or bone destruction. Bones appear demineralized. IMPRESSION: RIGHT hip prosthesis without acute complication. Electronically Signed   By: Lavonia Dana M.D.   On: 01/28/2016 14:27   Dg C-arm 1-60 Min-no Report  01/28/2016  CLINICAL DATA: surgery C-ARM 1-60 MINUTES Fluoroscopy was utilized by the requesting physician.  No radiographic interpretation.    EKG: Orders placed or performed during the hospital encounter of 11/11/15  . ED EKG  . ED EKG  . EKG 12-Lead  . EKG 12-Lead     Hospital Course: Patient was admitted to [facility] and taken to the OR and underwent the above state procedure without complications.  Patient tolerated the procedure well and was later transferred to the recovery room and then to the orthopaedic floor for postoperative care.  They were given PO and IV analgesics for pain control following their surgery.  They were given 24 hours of postoperative antibiotics of  Anti-infectives    Start     Dose/Rate Route Frequency Ordered Stop   01/28/16 1101  ceFAZolin (ANCEF) IVPB 2g/100 mL premix     2 g 200 mL/hr over 30 Minutes Intravenous On call to O.R. 01/28/16 1101 01/28/16 1235     and started on DVT prophylaxis in the form of Xarelto.   PT and OT were ordered for total hip protocol.  The patient was allowed to be WBAT with therapy. Discharge planning was consulted to help with postop disposition and equipment needs.  Patient had a fair night on the evening of surgery.  They started to get up OOB with therapy on day one.  Hemovac drain was pulled without difficulty.  The knee immobilizer was removed and discontinued. Progressed somewhat slowly with transfers but well with mobility. Continued to work with therapy into  day two.  Dressing was changed on day two and the incision was clean and dry.  Incision was healing well.  Patient was seen in rounds and was ready to go to SNF if therapy progressed.   Diet: Cardiac  diet Activity:WBAT No bending hip over 90 degrees- A "L" Angle Do not cross legs Do not let foot roll inward When turning these patients a pillow should be placed between the patient's legs to prevent crossing. Patients should have the affected knee fully extended when trying to sit or stand from all surfaces to prevent excessive hip flexion. When ambulating and turning toward the affected side the affected leg should have the toes turned out prior to moving the walker and the rest of patient's body as to prevent internal rotation/ turning in of the leg. Abduction pillows are the most effective way to prevent a patient from not crossing legs or turning toes in at rest. If an abduction pillow is not ordered placing a regular pillow length wise between the patient's legs is also an effective reminder. It is imperative that these precautions be maintained so that the surgical hip does not dislocate. Follow-up:in 2 weeks Disposition - Skilled nursing facility Discharged Condition: stable   Discharge Instructions    Call MD / Call 911    Complete by:  As directed   If you experience chest pain or shortness of breath, CALL 911 and be transported to the hospital emergency room.  If you develope a fever above 101 F, pus (white drainage) or increased drainage or redness at the wound, or calf pain, call your surgeon's office.     Constipation Prevention    Complete by:  As directed   Drink plenty of fluids.  Prune juice may be helpful.  You may use a stool softener, such as Colace (over the counter) 100 mg twice a day.  Use MiraLax (over the counter) for constipation as needed.     Diet - low sodium heart healthy    Complete by:  As directed      Increase activity slowly as tolerated    Complete by:  As directed             Medication List    STOP taking these medications        aspirin EC 81 MG tablet     multivitamin tablet     Vitamin B-12 2500 MCG Subl      TAKE these medications         acetaminophen 500 MG tablet  Commonly known as:  TYLENOL  Take 1,000 mg by mouth daily.     Estradiol-Norethindrone Acet 0.5-0.1 MG tablet  Take 1 tablet by mouth daily.     levothyroxine 137 MCG tablet  Commonly known as:  SYNTHROID, LEVOTHROID  Take 137 mcg by mouth at bedtime.     losartan-hydrochlorothiazide 100-25 MG tablet  Commonly known as:  HYZAAR  Take 0.5 tablets by mouth daily.     methocarbamol 500 MG tablet  Commonly known as:  ROBAXIN  Take 1 tablet (500 mg total) by mouth every 6 (six) hours as needed for muscle spasms.     omeprazole 20 MG capsule  Commonly known as:  PRILOSEC  Take 20 mg by mouth daily.     oxyCODONE 5 MG immediate release tablet  Commonly known as:  Oxy IR/ROXICODONE  Take 1-2 tablets (5-10 mg total) by mouth every 3 (three) hours as needed for breakthrough pain.  rivaroxaban 10 MG Tabs tablet  Commonly known as:  XARELTO  Take 1 tablet (10 mg total) by mouth daily with breakfast.     rosuvastatin 10 MG tablet  Commonly known as:  CRESTOR  Take 10 mg by mouth at bedtime.     SYSTANE BALANCE OP  Place 1 drop into both eyes daily as needed (For dry eyes.).     traMADol 50 MG tablet  Commonly known as:  ULTRAM  Take 1-2 tablets (50-100 mg total) by mouth every 6 (six) hours as needed for moderate pain.           Follow-up Information    Follow up with Gearlean Alf, MD. Schedule an appointment as soon as possible for a visit on 02/10/2016.   Specialty:  Orthopedic Surgery   Contact information:   [address] [place] [address] [phone]       Follow up with [facility].   Specialty:  Skilled Nursing Facility   Contact information:   [place] [phone]      Signed: Ardeen Jourdain, PA-C Orthopaedic Surgery 01/30/2016, 7:04 AM

## 2016-01-30 NOTE — Clinical Social Work Placement (Signed)
   CLINICAL SOCIAL WORK PLACEMENT  NOTE  Date:  01/30/2016  Patient Details  Name: Maria Marquez MRN: [id number] Date of Birth: [birth year]  Clinical Social Work is seeking post-discharge placement for this patient at the [place] level of care (*CSW will initial, date and re-position this form in  chart as items are completed):  Yes   Patient/family provided with [place] Work Department's list of facilities offering this level of care within the geographic area requested by the patient (or if unable, by the patient's family).  Yes   Patient/family informed of their freedom to choose among providers that offer the needed level of care, that participate in Medicare, Medicaid or managed care program needed by the patient, have an available bed and are willing to accept the patient.  Yes   Patient/family informed of [facility]'s ownership interest in [facility] and [facility], as well as of the fact that they are under no obligation to receive care at these facilities.  PASRR submitted to EDS on 01/29/16     PASRR number received on 01/29/16     Existing PASRR number confirmed on       FL2 transmitted to all facilities in geographic area requested by pt/family on 01/29/16     FL2 transmitted to all facilities within larger geographic area on       Patient informed that his/her managed care company has contracts with or will negotiate with certain facilities, including the following:        Yes   Patient/family informed of bed offers received.  Patient chooses bed at [facility]     Physician recommends and patient chooses bed at      Patient to be [facility] to [facility].  Patient to be transferred to facility by car     Patient family notified on 01/30/16 of transfer.  Name of family member notified:  daughter, Olivia Mackie     PHYSICIAN       Additional Comment:     _______________________________________________ Drema Balzarine D, LCSW 01/30/2016, 12:15 PM

## 2016-01-30 NOTE — Clinical Social Work Note (Signed)
CSW informed pt, daughter and facility staff of the d/c order.  Facility staff is actively seeking insurance authorization.  CSW informed family of options (private pay, LOG potentially at a different facility or d/c home) if authorization is not received.  CSW will continue to assist with discharge planning needs.  Maria Marquez, [place]

## 2016-02-02 ENCOUNTER — Encounter: Payer: Self-pay | Admitting: Adult Health

## 2016-02-02 ENCOUNTER — Non-Acute Institutional Stay (SKILLED_NURSING_FACILITY): Payer: Medicare HMO | Admitting: Adult Health

## 2016-02-02 DIAGNOSIS — E785 Hyperlipidemia, unspecified: Secondary | ICD-10-CM | POA: Diagnosis not present

## 2016-02-02 DIAGNOSIS — I10 Essential (primary) hypertension: Secondary | ICD-10-CM | POA: Diagnosis not present

## 2016-02-02 DIAGNOSIS — E039 Hypothyroidism, unspecified: Secondary | ICD-10-CM

## 2016-02-02 DIAGNOSIS — M1611 Unilateral primary osteoarthritis, right hip: Secondary | ICD-10-CM | POA: Diagnosis not present

## 2016-02-02 DIAGNOSIS — K219 Gastro-esophageal reflux disease without esophagitis: Secondary | ICD-10-CM

## 2016-02-02 DIAGNOSIS — D62 Acute posthemorrhagic anemia: Secondary | ICD-10-CM

## 2016-02-02 DIAGNOSIS — N952 Postmenopausal atrophic vaginitis: Secondary | ICD-10-CM

## 2016-02-02 DIAGNOSIS — D72829 Elevated white blood cell count, unspecified: Secondary | ICD-10-CM

## 2016-02-02 DIAGNOSIS — R2681 Unsteadiness on feet: Secondary | ICD-10-CM | POA: Diagnosis not present

## 2016-02-02 DIAGNOSIS — K5901 Slow transit constipation: Secondary | ICD-10-CM | POA: Diagnosis not present

## 2016-02-02 NOTE — Progress Notes (Signed)
Patient ID: [name], female   DOB: 1945/10/19, 70 y.o.   MRN: [id number]    DATE:  02/02/2016   MRN:  [id number]  BIRTHDAY: 15-Oct-1945  Facility:  Nursing Home Location:  [place] Room Number: 620-B  LEVEL OF CARE:  SNF (31)  Contact Information    Name [place] Daughter [phone]  [phone]       Code Status History    This patient does not have a recorded code status. Please follow your organizational policy for patients in this situation.       Chief Complaint  Patient presents with  . Hospitalization Follow-up    HISTORY OF PRESENT ILLNESS:  This is a 70 year old female who has been admitted to [facility] on 01/30/16 from [facility]. She has PMH of hypertension, hypothyroidism, GERD and arthritis. She has osteoarthritis of right hip for which she had right total hip arthroplasty anterior approach on 01/28/16.  She has been admitted for a short-term rehabilitation.   PAST MEDICAL HISTORY:  Past Medical History  Diagnosis Date  . Hypertension   . Hypothyroid   . GERD (gastroesophageal reflux disease)   . Arthritis   . Osteoarthritis of hip      CURRENT MEDICATIONS: Reviewed  Patient's Medications  New Prescriptions   No medications on file  Previous Medications   ACETAMINOPHEN (TYLENOL) 500 MG TABLET    Take 1,000 mg by mouth daily.   ATORVASTATIN (LIPITOR) 40 MG TABLET    Take 40 mg by mouth daily at 6 PM.   DOCUSATE SODIUM (COLACE) 100 MG CAPSULE    Take 100 mg by mouth 2 (two) times daily.   ESTRADIOL-NORETHINDRONE ACET 0.5-0.1 MG TABLET    Take 1 tablet by mouth daily.   LEVOTHYROXINE (SYNTHROID, LEVOTHROID) 137 MCG TABLET    Take 137 mcg by mouth at bedtime.    LOSARTAN-HYDROCHLOROTHIAZIDE (HYZAAR) 100-25 MG TABLET    Take 0.5 tablets by mouth daily.    METHOCARBAMOL (ROBAXIN) 500 MG TABLET    Take 1 tablet (500 mg total) by mouth every 6 (six) hours as needed for  muscle spasms.   OMEPRAZOLE (PRILOSEC) 20 MG CAPSULE    Take 20 mg by mouth daily.   OXYCODONE (OXY IR/ROXICODONE) 5 MG IMMEDIATE RELEASE TABLET    Take 1-2 tablets (5-10 mg total) by mouth every 3 (three) hours as needed for breakthrough pain.   POLYETHYLENE GLYCOL (MIRALAX / GLYCOLAX) PACKET    Take 17 g by mouth daily as needed for mild constipation.   PROPYLENE GLYCOL (SYSTANE BALANCE OP)    Place 1 drop into both eyes daily as needed (For dry eyes.).   RIVAROXABAN (XARELTO) 10 MG TABS TABLET    Take 1 tablet (10 mg total) by mouth daily with breakfast.   TRAMADOL (ULTRAM) 50 MG TABLET    Take 1-2 tablets (50-100 mg total) by mouth every 6 (six) hours as needed for moderate pain.  Modified Medications   No medications on file  Discontinued Medications   ROSUVASTATIN (CRESTOR) 10 MG TABLET    Take 10 mg by mouth at bedtime.     Allergies  Allergen Reactions  . Eggs Or Egg-Derived Products Nausea And Vomiting  . Shellfish Allergy Hives  . Sulfa Antibiotics Nausea Only  . Latex Rash     REVIEW OF SYSTEMS:  GENERAL: no change in appetite, no fatigue, no weight changes, no fever, chills or weakness EYES: Denies  change in vision, dry eyes, eye pain, itching or discharge EARS: Denies change in hearing, ringing in ears, or earache NOSE: Denies nasal congestion or epistaxis MOUTH and THROAT: Denies oral discomfort, gingival pain or bleeding, pain from teeth or hoarseness   RESPIRATORY: no cough, SOB, DOE, wheezing, hemoptysis CARDIAC: no chest pain, or palpitations GI: no abdominal pain, diarrhea, constipation, heart burn, nausea or vomiting GU: Denies dysuria, frequency, hematuria, incontinence, or discharge PSYCHIATRIC: Denies feeling of depression or anxiety. No report of hallucinations, insomnia, paranoia, or agitation   PHYSICAL EXAMINATION  GENERAL APPEARANCE: Well nourished. In no acute distress. Obese SKIN:  Right hip surgical incision has Steri-Strips and covered with dry  dressing, no erythema  HEAD: Normal in size and contour. No evidence of trauma EYES: Lids open and close normally. No blepharitis, entropion or ectropion. PERRL. Conjunctivae are clear and sclerae are white. Lenses are without opacity EARS: Pinnae are normal. Patient hears normal voice tunes of the examiner MOUTH and THROAT: Lips are without lesions. Oral mucosa is moist and without lesions. Tongue is normal in shape, size, and color and without lesions NECK: supple, trachea midline, no neck masses, no thyroid tenderness, no thyromegaly LYMPHATICS: no LAN in the neck, no supraclavicular LAN RESPIRATORY: breathing is even & unlabored, BS CTAB CARDIAC: RRR, no murmur,no extra heart sounds, RLE trace edema GI: abdomen soft, normal BS, no masses, no tenderness, no hepatomegaly, no splenomegaly EXTREMITIES:  Able to move 4 extremities PSYCHIATRIC: Alert and oriented X 3. Affect and behavior are appropriate  LABS/RADIOLOGY: Labs reviewed: Basic Metabolic Panel:  Recent Labs  01/21/16 1400 01/29/16 0421 01/30/16 0405  NA 140 138 140  K 3.2* 4.1 4.0  CL 106 107 108  CO2 '28 25 26  '$ GLUCOSE 102* 156* 136*  BUN 15 18 21*  CREATININE 1.03* 1.04* 0.93  CALCIUM 9.1 8.4* 8.3*   Liver Function Tests:  Recent Labs  11/11/15 1503 01/21/16 1400  AST 20 18  ALT 22 19  ALKPHOS 71 58  BILITOT 0.7 0.9  PROT 6.6 7.1  ALBUMIN 3.6 4.2   CBC:  Recent Labs  11/11/15 1503 01/21/16 1400 01/29/16 0421 01/30/16 0405  WBC 13.6* 8.7 16.1* 16.9*  NEUTROABS 11.0*  --   --   --   HGB 12.7 13.2 11.3* 10.5*  HCT 38.9 39.4 34.1* 31.9*  MCV 95.1 93.4 93.2 93.8  PLT 235 283 252 233      Dg Pelvis Portable  01/28/2016  CLINICAL DATA:  Post RIGHT hip replacement EXAM: PORTABLE PELVIS 1-2 VIEWS COMPARISON:  Portable exam 1415 hours without priors for comparison FINDINGS: Fluoroscopy time not provided, please referred operative records. RIGHT hip prosthesis identified in expected position. Surgical  drain present. No acute fracture, dislocation or bone destruction. Bones appear demineralized. IMPRESSION: RIGHT hip prosthesis without acute complication. Electronically Signed   By: Lavonia Dana M.D.   On: 01/28/2016 14:27   Dg C-arm 1-60 Min-no Report  01/28/2016  CLINICAL DATA: surgery C-ARM 1-60 MINUTES Fluoroscopy was utilized by the requesting physician.  No radiographic interpretation.    ASSESSMENT/PLAN:  Unsteady gait - for rehabilitation, PT and OT  Osteoarthritis of right hip S/P right total hip arthroplasty anterior approach - for rehabilitation; continue Xarelto 10 mg 1 tab by mouth daily for DVT prophylaxis, acetaminophen 500 mg take 2 tabs = 1000 mg by mouth daily, oxycodone 5 mg 1-2 tabs by mouth Q 3 hours when necessary and tramadol 50 mg 1-2 tabs by mouth every 6 hours PRN for  pain; Robaxin 500 mg 1 tab by mouth every 6 hours when necessary for muscle spasm; follow-up with Dr. Wynelle Link, orthopedic surgeon, on 02/10/16  GERD - continue omeprazole 20 mg 1 capsule by mouth daily  Vaginal atrophy - continue estradiol-norethindrone acet  0.5-0.1 mg 1 tab by mouth daily  Hypertension - continue losartan-HCTZ 100-25 milligrams 1/2 tab by mouth daily; check BMP  Hypothyroidism - continue levothyroxine 137 g 1 tab by mouth daily at bedtime; check tsh  Constipation - continue MiraLAX 17 g by mouth daily when necessary and Colace 100 mg by mouth twice a day  Anemia, acute blood loss - check CBC Lab Results  Component Value Date   WBC 16.9* 01/30/2016   HGB 10.5* 01/30/2016   HCT 31.9* 01/30/2016   MCV 93.8 01/30/2016   PLT 233 01/30/2016   Hyperlipidemia - continue Lipitor 40 mg 1 tab by mouth daily at bedtime  Leukocytosis - wbc 16.9; will monitor    Goals of care:  Short-term rehabilitation    Durenda Age, NP [place] [phone]

## 2016-02-03 ENCOUNTER — Encounter: Payer: Self-pay | Admitting: Internal Medicine

## 2016-02-03 ENCOUNTER — Non-Acute Institutional Stay (SKILLED_NURSING_FACILITY): Payer: Medicare HMO | Admitting: Internal Medicine

## 2016-02-03 DIAGNOSIS — R1114 Bilious vomiting: Secondary | ICD-10-CM | POA: Diagnosis not present

## 2016-02-03 DIAGNOSIS — D62 Acute posthemorrhagic anemia: Secondary | ICD-10-CM

## 2016-02-03 DIAGNOSIS — M1611 Unilateral primary osteoarthritis, right hip: Secondary | ICD-10-CM

## 2016-02-03 DIAGNOSIS — K219 Gastro-esophageal reflux disease without esophagitis: Secondary | ICD-10-CM

## 2016-02-03 DIAGNOSIS — E785 Hyperlipidemia, unspecified: Secondary | ICD-10-CM | POA: Diagnosis not present

## 2016-02-03 DIAGNOSIS — K5900 Constipation, unspecified: Secondary | ICD-10-CM

## 2016-02-03 DIAGNOSIS — D72829 Elevated white blood cell count, unspecified: Secondary | ICD-10-CM

## 2016-02-03 DIAGNOSIS — E038 Other specified hypothyroidism: Secondary | ICD-10-CM

## 2016-02-03 DIAGNOSIS — R531 Weakness: Secondary | ICD-10-CM | POA: Diagnosis not present

## 2016-02-03 DIAGNOSIS — I10 Essential (primary) hypertension: Secondary | ICD-10-CM

## 2016-02-03 NOTE — Progress Notes (Signed)
LOCATION: [facility]  PCP: Leamon Arnt, MD   Code Status: Full Code  Goals of care: Advanced Directive information Advanced Directives 01/28/2016  Does patient have an advance directive? Yes  Type of Paramedic of [place];Living will  Does patient want to make changes to advanced directive? No - Patient declined  Copy of advanced directive(s) in chart? No - copy requested       Extended Emergency Contact Information Primary Emergency Contact: Aguilar,Tracey Address: [place], [place] [address] [place] Phone: [phone] Mobile Phone: [phone] Relation: Daughter Secondary Emergency Contact: Aguilar,Jennifer Address: [address]          [place], [place] [address] [place] of [place] Mobile Phone: [phone] Relation: Other   Allergies  Allergen Reactions  . Eggs Or Egg-Derived Products Nausea And Vomiting  . Shellfish Allergy Hives  . Sulfa Antibiotics Nausea Only  . Latex Rash    Chief Complaint  Patient presents with  . New Admit To SNF    New Admission     HPI:  Patient is a 70 y.o. female seen today for short term rehabilitation post hospital admission from 01/28/16-01/30/16 with right hip OA. She underwent right total hip arthroplasty. She is seen in her room today. She complaints of being nauseated. She has been constipated. No other concerns. Pain has been under control with current pain regimen.   Review of Systems:  Constitutional: Negative for fever, chills, diaphoresis. Feels weak and tired.  HENT: Negative for headache, congestion, nasal discharge, hearing loss, sore throat, difficulty swallowing.   Eyes: Negative for blurred vision, double vision and discharge.  Respiratory: Negative for cough, shortness of breath and wheezing.   Cardiovascular: Negative for chest pain, palpitations, leg swelling.  Gastrointestinal: Negative for heartburn, abdominal pain. Last bowel  movement was last Wednesday. Passing flatus.  Genitourinary: Negative for dysuria and flank pain.  Musculoskeletal: Negative for back pain, fall in the facility.  Skin: Negative for itching, rash.  Neurological: Positive for occasional dizziness with change of position.  Psychiatric/Behavioral: Negative for depression.    Past Medical History  Diagnosis Date  . Hypertension   . Hypothyroid   . GERD (gastroesophageal reflux disease)   . Arthritis   . Osteoarthritis of hip    Past Surgical History  Procedure Laterality Date  . Thyroid lobectomy      right side  . Hernia repair    . Total hip arthroplasty Right 01/28/2016    Procedure: RIGHT TOTAL HIP ARTHROPLASTY ANTERIOR APPROACH;  Surgeon: [name], MD;  Location: [facility];  Service: Orthopedics;  Laterality: Right;   Social History:   reports that she has been smoking Cigarettes.  She has been smoking about 0.50 packs per day. She does not have any smokeless tobacco history on file. She reports that she drinks alcohol. She reports that she does not use illicit drugs.  No family history on file.  Medications:   Medication List       This list is accurate as of: 02/03/16  3:06 PM.  Always use your most recent med list.               acetaminophen 500 MG tablet  Commonly known as:  TYLENOL  Take 1,000 mg by mouth daily.     atorvastatin 40 MG tablet  Commonly known as:  LIPITOR  Take 40 mg by mouth daily at 6 PM.  docusate sodium 100 MG capsule  Commonly known as:  COLACE  Take 100 mg by mouth 2 (two) times daily.     Estradiol-Norethindrone Acet 0.5-0.1 MG tablet  Take 1 tablet by mouth daily.     levothyroxine 137 MCG tablet  Commonly known as:  SYNTHROID, LEVOTHROID  Take 137 mcg by mouth at bedtime.     losartan-hydrochlorothiazide 100-25 MG tablet  Commonly known as:  HYZAAR  Take 1 tablet by mouth daily.     methocarbamol 500 MG tablet  Commonly known as:  ROBAXIN  Take 1 tablet (500 mg total)  by mouth every 6 (six) hours as needed for muscle spasms.     omeprazole 20 MG capsule  Commonly known as:  PRILOSEC  Take 20 mg by mouth daily.     oxyCODONE 5 MG immediate release tablet  Commonly known as:  Oxy IR/ROXICODONE  Take 1-2 tablets (5-10 mg total) by mouth every 3 (three) hours as needed for breakthrough pain.     polyethylene glycol packet  Commonly known as:  MIRALAX / GLYCOLAX  Take 17 g by mouth daily as needed for mild constipation.     rivaroxaban 10 MG Tabs tablet  Commonly known as:  XARELTO  Take 1 tablet (10 mg total) by mouth daily with breakfast.     SYSTANE BALANCE OP  Place 1 drop into both eyes daily as needed (For dry eyes.).     traMADol 50 MG tablet  Commonly known as:  ULTRAM  Take 1-2 tablets (50-100 mg total) by mouth every 6 (six) hours as needed for moderate pain.        Immunizations:  There is no immunization history on file for this patient.   Physical Exam: Filed Vitals:   02/03/16 1458  BP: 108/62  Pulse: 61  Temp: 98 F (36.7 C)  TempSrc: Oral  Resp: 18  Height: '5\' 6"'$  (1.676 m)  Weight: 236 lb (107.049 kg)  SpO2: 96%   Body mass index is 38.11 kg/(m^2).  General- elderly female, obese, in no acute distress Head- normocephalic, atraumatic Nose- no maxillary or frontal sinus tenderness, no nasal discharge Throat- moist mucus membrane Eyes- PERRLA, EOMI, no pallor, no icterus, no discharge, normal conjunctiva, normal sclera Neck- no cervical lymphadenopathy Cardiovascular- normal s1,s2, no murmur, trace leg edema Respiratory- bilateral clear to auscultation, no wheeze, no rhonchi, no crackles, no use of accessory muscles Abdomen- bowel sounds present, soft, non tender, no guarding or rigidity Musculoskeletal- able to move all 4 extremities, limited right leg range of motion Neurological- alert and oriented to person, place and time Skin- warm and dry, right hip surgical incision with dressing clean and dry Psychiatry-  normal mood and affect    Labs reviewed: Basic Metabolic Panel:  Recent Labs  01/21/16 1400 01/29/16 0421 01/30/16 0405  NA 140 138 140  K 3.2* 4.1 4.0  CL 106 107 108  CO2 '28 25 26  '$ GLUCOSE 102* 156* 136*  BUN 15 18 21*  CREATININE 1.03* 1.04* 0.93  CALCIUM 9.1 8.4* 8.3*   Liver Function Tests:  Recent Labs  11/11/15 1503 01/21/16 1400  AST 20 18  ALT 22 19  ALKPHOS 71 58  BILITOT 0.7 0.9  PROT 6.6 7.1  ALBUMIN 3.6 4.2   No results for input(s): LIPASE, AMYLASE in the last 8760 hours. No results for input(s): AMMONIA in the last 8760 hours. CBC:  Recent Labs  11/11/15 1503 01/21/16 1400 01/29/16 0421 01/30/16 0405  WBC 13.6* 8.7  16.1* 16.9*  NEUTROABS 11.0*  --   --   --   HGB 12.7 13.2 11.3* 10.5*  HCT 38.9 39.4 34.1* 31.9*  MCV 95.1 93.4 93.2 93.8  PLT 235 283 252 233    Radiological Exams: Dg Pelvis Portable  01/28/2016  CLINICAL DATA:  Post RIGHT hip replacement EXAM: PORTABLE PELVIS 1-2 VIEWS COMPARISON:  Portable exam 1415 hours without priors for comparison FINDINGS: Fluoroscopy time not provided, please referred operative records. RIGHT hip prosthesis identified in expected position. Surgical drain present. No acute fracture, dislocation or bone destruction. Bones appear demineralized. IMPRESSION: RIGHT hip prosthesis without acute complication. Electronically Signed   By: Lavonia Dana M.D.   On: 01/28/2016 14:27   Dg C-arm 1-60 Min-no Report  01/28/2016  CLINICAL DATA: surgery C-ARM 1-60 MINUTES Fluoroscopy was utilized by the requesting physician.  No radiographic interpretation.    Assessment/Plan   Generalized weakness Will have her work with physical therapy and occupational therapy team to help with gait training and muscle strengthening exercises.fall precautions. Skin care. Encourage to be out of bed.   Right hip Osteoarthritis S/P right total hip arthroplasty anterior approach. Has orthopedic follow up. Continue oxyIR 5 mg 1-2 tab q3h  prn pain and tramadol 50 mg 1-2 tab q6h prn pain. Continue Xarelto 10 mg daily for DVT prophylaxis. Continue robaxin 500 mg q6h prn muscle spasm. Will have patient work with PT/OT as tolerated to regain strength and restore function.  Fall precautions are in place.  Blood loss anemia Monitor cbc, post op  Leukocytosis Afebrile, monitor wbc and temp curve  Nausea and vomiting Vomit x 1 with bilious liquid. Start zofran 4 mg q8h prn nausea and have her on clear liquid diet for now and advance as tolerated. Check bmp. Will need to have bowel movement. Has bowel sounds present  Constipation Change miralax to daily and add senna s 2 tab qhs. Discontinue colace and monitor  HTN Monitor bo and bmp, continue losartan-hctz for now  GERD  Stable with omeprazole 20 mg daily  Hypothyroidism continue levothyroxine 137 mcg daily  Hyperlipidemia continue Lipitor    Goals of care: short term rehabilitation   Labs/tests ordered: cbc with diff and bmp  Family/ staff Communication: reviewed care plan with patient and nursing supervisor    Blanchie Serve, MD Internal Medicine [place], [place] [address] Cell Phone (Monday-Friday 8 am - 5 pm): [phone] On Call: [phone] and follow prompts after 5 pm and on weekends Office Phone: [phone] Office Fax: [phone]

## 2016-02-04 LAB — LIPID PANEL
Cholesterol: 140 mg/dL (ref 0–200)
HDL: 43 mg/dL (ref 35–70)
LDL Cholesterol: 70 mg/dL
Triglycerides: 137 mg/dL (ref 40–160)

## 2016-02-04 LAB — BASIC METABOLIC PANEL
BUN: 18 mg/dL (ref 4–21)
Creatinine: 1.2 mg/dL — AB (ref 0.5–1.1)
Glucose: 92 mg/dL
Potassium: 4.6 mmol/L (ref 3.4–5.3)
Sodium: 141 mmol/L (ref 137–147)

## 2016-02-04 LAB — CBC AND DIFFERENTIAL
HCT: 35 % — AB (ref 36–46)
Hemoglobin: 11.2 g/dL — AB (ref 12.0–16.0)
Neutrophils Absolute: 7 /uL
Platelets: 303 10*3/uL (ref 150–399)
WBC: 10.4 10^3/mL

## 2016-02-04 LAB — TSH: TSH: 2.51 u[IU]/mL (ref 0.41–5.90)

## 2016-02-10 ENCOUNTER — Non-Acute Institutional Stay: Payer: Medicare HMO | Admitting: Adult Health

## 2016-02-10 ENCOUNTER — Encounter: Payer: Self-pay | Admitting: Adult Health

## 2016-02-10 DIAGNOSIS — E785 Hyperlipidemia, unspecified: Secondary | ICD-10-CM

## 2016-02-10 DIAGNOSIS — R2681 Unsteadiness on feet: Secondary | ICD-10-CM

## 2016-02-10 DIAGNOSIS — E039 Hypothyroidism, unspecified: Secondary | ICD-10-CM | POA: Diagnosis not present

## 2016-02-10 DIAGNOSIS — N952 Postmenopausal atrophic vaginitis: Secondary | ICD-10-CM | POA: Diagnosis not present

## 2016-02-10 DIAGNOSIS — K219 Gastro-esophageal reflux disease without esophagitis: Secondary | ICD-10-CM

## 2016-02-10 DIAGNOSIS — K5901 Slow transit constipation: Secondary | ICD-10-CM | POA: Diagnosis not present

## 2016-02-10 DIAGNOSIS — M1611 Unilateral primary osteoarthritis, right hip: Secondary | ICD-10-CM | POA: Diagnosis not present

## 2016-02-10 DIAGNOSIS — I10 Essential (primary) hypertension: Secondary | ICD-10-CM

## 2016-02-10 DIAGNOSIS — D62 Acute posthemorrhagic anemia: Secondary | ICD-10-CM | POA: Diagnosis not present

## 2016-02-10 NOTE — Progress Notes (Signed)
Patient ID: Maria Marquez, female   DOB: October 09, 1945, 70 y.o.   MRN: [id number]    DATE:   02/10/16  MRN:  [id number]  BIRTHDAY: July 01, 1946  Facility:  [facility] Location:  [facility] Room Number: 921-J  LEVEL OF CARE:  SNF (31)  Contact Information    Name [place] Daughter [phone]  [phone]   [place] Other   [phone]       Code Status History    This patient does not have a recorded code status. Please follow your organizational policy for patients in this situation.       Chief Complaint  Patient presents with  . Discharge Note    HISTORY OF PRESENT ILLNESS:  This is a 70 year old female who is for discharge home with Home health PT, OT and CNA. DME:  Rolling walker and 3-in-1 bedside commode.  She has been admitted to [facility] on 01/30/16 from [facility]. She has PMH of hypertension, hypothyroidism, GERD and arthritis. She has osteoarthritis of right hip for which she had right total hip arthroplasty anterior approach on 01/28/16.  Patient was admitted to this facility for short-term rehabilitation after the patient's recent hospitalization.  Patient has completed SNF rehabilitation and therapy has cleared the patient for discharge.   PAST MEDICAL HISTORY:  Past Medical History:  Diagnosis Date  . Acute blood loss anemia   . Arthritis   . Bilious vomiting with nausea   . Constipation   . GERD (gastroesophageal reflux disease)   . HLD (hyperlipidemia)   . Hypertension   . Hypothyroid   . Leukocytosis   . Osteoarthritis of hip    Right     CURRENT MEDICATIONS: Reviewed  Patient's Medications  New Prescriptions   No medications on file  Previous Medications   ACETAMINOPHEN (TYLENOL) 500 MG TABLET    Take 1,000 mg by mouth daily.   ATORVASTATIN (LIPITOR) 40 MG TABLET    Take 40 mg by mouth at bedtime.    ESTRADIOL-NORETHINDRONE ACET 0.5-0.1 MG TABLET     Take 1 tablet by mouth daily.   LEVOTHYROXINE (SYNTHROID, LEVOTHROID) 137 MCG TABLET    Take 137 mcg by mouth at bedtime.    LINACLOTIDE (LINZESS) 145 MCG CAPS CAPSULE    Take 145 mcg by mouth daily before breakfast.   LOSARTAN-HYDROCHLOROTHIAZIDE (HYZAAR) 100-25 MG TABLET    Take 1 tablet by mouth daily.    METHOCARBAMOL (ROBAXIN) 500 MG TABLET    Take 1 tablet (500 mg total) by mouth every 6 (six) hours as needed for muscle spasms.   OMEPRAZOLE (PRILOSEC) 20 MG CAPSULE    Take 20 mg by mouth daily.   ONDANSETRON (ZOFRAN) 4 MG TABLET    Take 4 mg by mouth every 8 (eight) hours as needed for nausea or vomiting.   OXYCODONE (OXY IR/ROXICODONE) 5 MG IMMEDIATE RELEASE TABLET    Take 1-2 tablets (5-10 mg total) by mouth every 3 (three) hours as needed for breakthrough pain.   POLYETHYLENE GLYCOL (MIRALAX / GLYCOLAX) PACKET    Take 17 g by mouth 2 (two) times daily.    PROPYLENE GLYCOL (SYSTANE BALANCE OP)    Place 1 drop into both eyes daily as needed (For dry eyes.).   RIVAROXABAN (XARELTO) 10 MG TABS TABLET    Take 1 tablet (10 mg total) by mouth daily with breakfast.   SENNA (SENOKOT) 8.6 MG TABLET    Take 2 tablets  by mouth 2 (two) times daily.   TRAMADOL (ULTRAM) 50 MG TABLET    Take 1-2 tablets (50-100 mg total) by mouth every 6 (six) hours as needed for moderate pain.  Modified Medications   No medications on file  Discontinued Medications   DOCUSATE SODIUM (COLACE) 100 MG CAPSULE    Take 100 mg by mouth 2 (two) times daily.     Allergies  Allergen Reactions  . Eggs Or Egg-Derived Products Nausea And Vomiting  . Shellfish Allergy Hives  . Sulfa Antibiotics Nausea Only  . Latex Rash     REVIEW OF SYSTEMS:  GENERAL: no change in appetite, no fatigue, no weight changes, no fever, chills or weakness EARS: Denies change in hearing, ringing in ears, or earache NOSE: Denies nasal congestion or epistaxis MOUTH and THROAT: Denies oral discomfort, gingival pain or bleeding, pain from teeth  or hoarseness   RESPIRATORY: no cough, SOB, DOE, wheezing, hemoptysis CARDIAC: no chest pain, or palpitations GI: no abdominal pain, diarrhea, constipation, heart burn, nausea or vomiting GU: Denies dysuria, frequency, hematuria, incontinence, or discharge PSYCHIATRIC: Denies feeling of depression or anxiety. No report of hallucinations, insomnia, paranoia, or agitation   PHYSICAL EXAMINATION  GENERAL APPEARANCE: Well nourished. In no acute distress. Obese SKIN:  Right hip surgical incision has Steri-Strips, dry and no redness HEAD: Normal in size and contour. No evidence of trauma EYES: Lids open and close normally. No blepharitis, entropion or ectropion. PERRL. Conjunctivae are clear and sclerae are white. Lenses are without opacity EARS: Pinnae are normal. Patient hears normal voice tunes of the examiner MOUTH and THROAT: Lips are without lesions. Oral mucosa is moist and without lesions. Tongue is normal in shape, size, and color and without lesions NECK: supple, trachea midline, no neck masses, no thyroid tenderness, no thyromegaly LYMPHATICS: no LAN in the neck, no supraclavicular LAN RESPIRATORY: breathing is even & unlabored, BS CTAB CARDIAC: RRR, no murmur,no extra heart sounds, RLE trace edema GI: abdomen soft, normal BS, no masses, no tenderness, no hepatomegaly, no splenomegaly EXTREMITIES:  Able to move 4 extremities PSYCHIATRIC: Alert and oriented X 3. Affect and behavior are appropriate  LABS/RADIOLOGY: Labs reviewed: Basic Metabolic Panel:  Recent Labs  01/21/16 1400 01/29/16 0421 01/30/16 0405 02/04/16  NA 140 138 140 141  K 3.2* 4.1 4.0 4.6  CL 106 107 108  --   CO2 '28 25 26  '$ --   GLUCOSE 102* 156* 136*  --   BUN 15 18 21* 18  CREATININE 1.03* 1.04* 0.93 1.2*  CALCIUM 9.1 8.4* 8.3*  --    Liver Function Tests:  Recent Labs  11/11/15 1503 01/21/16 1400  AST 20 18  ALT 22 19  ALKPHOS 71 58  BILITOT 0.7 0.9  PROT 6.6 7.1  ALBUMIN 3.6 4.2    CBC:  Recent Labs  11/11/15 1503 01/21/16 1400 01/29/16 0421 01/30/16 0405 02/04/16  WBC 13.6* 8.7 16.1* 16.9* 10.4  NEUTROABS 11.0*  --   --   --  7  HGB 12.7 13.2 11.3* 10.5* 11.2*  HCT 38.9 39.4 34.1* 31.9* 35*  MCV 95.1 93.4 93.2 93.8  --   PLT 235 283 252 233 303      ASSESSMENT/PLAN:  Unsteady gait - for Home health PT, OT and CNA, for therapeutic strengthening exercises; fall precaution  Osteoarthritis of right hip S/P right total hip arthroplasty anterior approach - for rehabilitation; continue Xarelto 10 mg 1 tab by mouth daily for DVT prophylaxis, acetaminophen 500 mg take 2  tabs = 1000 mg by mouth daily, oxycodone 5 mg 1-2 tabs by mouth Q 3 hours when necessary and tramadol 50 mg 1-2 tabs by mouth every 6 hours PRN for pain; Robaxin 500 mg 1 tab by mouth every 6 hours when necessary for muscle spasm; follow-up with Dr. Wynelle Link, orthopedic surgeon, on 02/10/16  GERD - continue omeprazole 20 mg 1 capsule by mouth daily  Vaginal atrophy - continue estradiol-norethindrone acet  0.5-0.1 mg 1 tab by mouth daily  Hypertension - well-controlled; continue losartan-HCTZ 100-25 mg 1/2 tab by mouth daily  Hypothyroidism - continue levothyroxine 137 g 1 tab by mouth daily at bedtime; check tsh  Constipation - continue MiraLAX 17 g by mouth BID and Senna-S 2 tabs PO Q HS  Anemia, acute blood loss - stable Lab Results  Component Value Date   WBC 10.4 02/04/2016   HGB 11.2 (A) 02/04/2016   HCT 35 (A) 02/04/2016   MCV 93.8 01/30/2016   PLT 303 02/04/2016   Hyperlipidemia - continue Lipitor 40 mg 1 tab by mouth daily at bedtime Lab Results  Component Value Date   CHOL 140 02/04/2016   HDL 43 02/04/2016   LDLCALC 70 02/04/2016   TRIG 137 02/04/2016    Leukocytosis - wbc 16.9; re-check wbc 10.4, resolved      I have filled out patient's discharge paperwork and written prescriptions.  Patient will receive home health PT, OT and CNA.  DME provided:  Rolling walker  and 3-in-1 bedside commode  Total discharge time: Greater than 30 minutes Greater than 50% was spent in counseling and coordination of care with the patient.   Discharge time involved coordination of the discharge process with social worker, nursing staff and therapy department. Medical justification for home health services/DME verified.    Durenda Age, NP [employer] [phone]

## 2016-02-12 DIAGNOSIS — I10 Essential (primary) hypertension: Secondary | ICD-10-CM | POA: Diagnosis not present

## 2016-02-12 DIAGNOSIS — K219 Gastro-esophageal reflux disease without esophagitis: Secondary | ICD-10-CM | POA: Diagnosis not present

## 2016-02-12 DIAGNOSIS — Z96641 Presence of right artificial hip joint: Secondary | ICD-10-CM | POA: Diagnosis not present

## 2016-02-12 DIAGNOSIS — Z471 Aftercare following joint replacement surgery: Secondary | ICD-10-CM | POA: Diagnosis not present

## 2016-04-20 ENCOUNTER — Other Ambulatory Visit: Payer: Self-pay | Admitting: Family Medicine

## 2016-04-20 DIAGNOSIS — N63 Unspecified lump in unspecified breast: Secondary | ICD-10-CM

## 2016-06-01 ENCOUNTER — Ambulatory Visit
Admission: RE | Admit: 2016-06-01 | Discharge: 2016-06-01 | Disposition: A | Discharge status: Home / Self Care | Payer: Medicare HMO | Source: Ambulatory Visit | Attending: Family Medicine | Admitting: Family Medicine

## 2016-06-01 DIAGNOSIS — N63 Unspecified lump in unspecified breast: Secondary | ICD-10-CM

## 2017-09-06 ENCOUNTER — Other Ambulatory Visit: Payer: Self-pay | Admitting: *Deleted

## 2017-09-06 ENCOUNTER — Encounter: Payer: Self-pay | Admitting: *Deleted

## 2017-09-14 ENCOUNTER — Ambulatory Visit: Payer: Medicare HMO | Admitting: Family Medicine

## 2017-09-27 ENCOUNTER — Other Ambulatory Visit: Payer: Self-pay | Admitting: Family Medicine

## 2017-09-27 DIAGNOSIS — N6320 Unspecified lump in the left breast, unspecified quadrant: Secondary | ICD-10-CM

## 2017-10-04 ENCOUNTER — Other Ambulatory Visit: Payer: Self-pay | Admitting: Family Medicine

## 2017-10-04 ENCOUNTER — Other Ambulatory Visit: Payer: Self-pay

## 2017-10-04 ENCOUNTER — Ambulatory Visit (INDEPENDENT_AMBULATORY_CARE_PROVIDER_SITE_OTHER): Payer: Medicare HMO | Admitting: Family Medicine

## 2017-10-04 ENCOUNTER — Encounter: Payer: Self-pay | Admitting: Family Medicine

## 2017-10-04 VITALS — BP 124/78 | HR 73 | Temp 98.1°F | Ht 65.25 in | Wt 241.8 lb

## 2017-10-04 DIAGNOSIS — I10 Essential (primary) hypertension: Secondary | ICD-10-CM | POA: Diagnosis not present

## 2017-10-04 DIAGNOSIS — Z79890 Hormone replacement therapy (postmenopausal): Secondary | ICD-10-CM | POA: Diagnosis not present

## 2017-10-04 DIAGNOSIS — F17200 Nicotine dependence, unspecified, uncomplicated: Secondary | ICD-10-CM | POA: Diagnosis not present

## 2017-10-04 DIAGNOSIS — N6320 Unspecified lump in the left breast, unspecified quadrant: Secondary | ICD-10-CM

## 2017-10-04 DIAGNOSIS — K429 Umbilical hernia without obstruction or gangrene: Secondary | ICD-10-CM

## 2017-10-04 DIAGNOSIS — E782 Mixed hyperlipidemia: Secondary | ICD-10-CM | POA: Diagnosis not present

## 2017-10-04 DIAGNOSIS — K635 Polyp of colon: Secondary | ICD-10-CM | POA: Insufficient documentation

## 2017-10-04 HISTORY — DX: Polyp of colon: K63.5

## 2017-10-04 MED ORDER — OMEPRAZOLE 20 MG PO CPDR: 20.0000 mg | DELAYED_RELEASE_CAPSULE | Freq: Every day | ORAL | 3 refills | Status: DC

## 2017-10-04 MED ORDER — LOSARTAN POTASSIUM-HCTZ 50-12.5 MG PO TABS: 1.0000 | ORAL_TABLET | Freq: Every day | ORAL | 3 refills | Status: DC

## 2017-10-04 MED ORDER — LEVOTHYROXINE SODIUM 137 MCG PO TABS: 137.0000 ug | ORAL_TABLET | Freq: Every day | ORAL | 3 refills | Status: DC

## 2017-10-04 NOTE — Progress Notes (Signed)
Subjective  CC:  Chief Complaint  Patient presents with  . Establish Care    Transfer from [place]  . Umbilical Hernia  . Medication Refill    HPI: Maria Marquez is a 72 y.o. female who presents to [place] at [facility] today to establish care with me as a new patient. She is a former [place] patient and is here to reestablish care with me today. I have reviewed notes in care everywhere.   She has the following concerns or needs:  Hypertension f/u: Control is good . Pt reports she is doing well. taking medications as instructed, no medication side effects noted, no TIAs, no chest pain on exertion, no dyspnea on exertion, no swelling of ankles. She needs refill of her medications. Has had trouble with losing weight.  She denies adverse effects from his BP medications. Compliance with medication is good.   Tobacco dependence: still smoking. Uses as stress reliever. Also good friend is smoker so they smoke together. Still grieving death of husband about 6 months ago. Not ready to try to quit. Breathing reportedly fine. Coughs daily.   HRT: stopped hormones and is doing well!  Lipids: due for recheck in march or April; stopped statin due to persistent myalgias, even on crestor. Pain resolved with cessation of meds. Tries to eat well but prefers to eat as she wishes. Has never been on zetia.   Umbilical hernia: noted some tenderness in that area while leaning up against a counter; bulge at umbilicus was easily reduced. No significant pain since; had repair in past, many years ago.   BP Readings from Last 3 Encounters:  10/04/17 124/78  02/10/16 130/60  02/03/16 108/62   Wt Readings from Last 3 Encounters:  10/04/17 241 lb 12.8 oz (109.7 kg)  02/10/16 236 lb (107 kg)  02/03/16 236 lb (107 kg)    Lab Results  Component Value Date   CHOL 140 02/04/2016   Lab Results  Component Value Date   HDL 43 02/04/2016   Lab Results  Component Value Date   LDLCALC 70  02/04/2016   Lab Results  Component Value Date   TRIG 137 02/04/2016   No results found for: CHOLHDL No results found for: LDLDIRECT Lab Results  Component Value Date   CREATININE 1.2 (A) 02/04/2016   BUN 18 02/04/2016   NA 141 02/04/2016   K 4.6 02/04/2016   CL 108 01/30/2016   CO2 26 01/30/2016    The 10-year ASCVD risk score Mikey Bussing DC Jr., et al., 2013) is: 16.4%   Values used to calculate the score:     Age: 34 years     Sex: Female     Is Non-Hispanic African American: No     Diabetic: No     Tobacco smoker: Yes     Systolic Blood Pressure: 979 mmHg     Is BP treated: Yes     HDL Cholesterol: 43 mg/dL     Total Cholesterol: 140 mg/dL    We updated and reviewed the patient's past history in detail and it is documented below.  Patient Active Problem List   Diagnosis Date Noted  . Tobacco dependence 12/09/2015    Priority: High  . Essential hypertension 06/17/2015    Priority: High  . Mixed hyperlipidemia 06/17/2015    Priority: High  . Obesity, Class II, BMI 35-39.9, with comorbidity 06/17/2015    Priority: High  . Postmenopausal hormone replacement therapy 06/17/2015    Priority: High  .  Postoperative hypothyroidism 06/17/2015    Priority: High  . Primary osteoarthritis involving multiple joints 06/17/2015    Priority: High  . Sessile colonic polyp 10/04/2017    Priority: Medium  . Status post right hip replacement 07/21/2015    Priority: Medium  . ACE inhibitor intolerance 06/17/2015    Priority: Medium  . DJD (degenerative joint disease), lumbar 06/17/2015    Priority: Medium  . Gastroesophageal reflux disease without esophagitis 06/17/2015    Priority: Medium  . Hematuria 07/28/2015    Priority: Low   Health Maintenance  Topic Date Due  . MAMMOGRAM  11/13/2017  . COLONOSCOPY  12/22/2020  . TETANUS/TDAP  06/16/2024  . INFLUENZA VACCINE  Completed  . DEXA SCAN  Completed  . Hepatitis C Screening  Completed  . PNA vac Low Risk Adult  Completed     Immunization History  Administered Date(s) Administered  . Influenza, High Dose Seasonal PF 04/28/2016  . Influenza, Seasonal, Injecte, Preservative Fre 04/25/2015  . Pneumococcal Conjugate-13 06/13/2015  . Pneumococcal Polysaccharide-23 06/10/2016  . Tdap 06/16/2014  . Zoster 05/27/2014   Current Meds  Medication Sig  . aspirin 81 MG chewable tablet Chew by mouth daily.  [name] azelastine (OPTIVAR) 0.05 % ophthalmic solution Place one drop into both eyes 2 (two) times daily.  [name] EPINEPHrine (EPIPEN 2-PAK) 0.3 mg/0.3 mL IJ SOAJ injection as needed.  [name] levothyroxine (SYNTHROID, LEVOTHROID) 137 MCG tablet Take 1 tablet (137 mcg total) by mouth at bedtime.  [name] losartan-hydrochlorothiazide (HYZAAR) 50-12.5 MG tablet Take 1 tablet by mouth daily.  [name] omeprazole (PRILOSEC) 20 MG capsule Take 1 capsule (20 mg total) by mouth daily.  . [DISCONTINUED] levothyroxine (SYNTHROID, LEVOTHROID) 137 MCG tablet Take 137 mcg by mouth at bedtime.   . [DISCONTINUED] losartan-hydrochlorothiazide (HYZAAR) 50-12.5 MG tablet Take 1 tablet by mouth daily.   . [DISCONTINUED] omeprazole (PRILOSEC) 20 MG capsule Take 20 mg by mouth daily.  . [DISCONTINUED] oxyCODONE (OXY IR/ROXICODONE) 5 MG immediate release tablet Take 1-2 tablets (5-10 mg total) by mouth every 3 (three) hours as needed for breakthrough pain.    Allergies: Patient is allergic to rosuvastatin; eggs or egg-derived products; shellfish allergy; sulfa antibiotics; latex; and lisinopril. Past Medical History Patient  has a past medical history of Acute blood loss anemia, Arthritis, Bilious vomiting with nausea, Constipation, GERD (gastroesophageal reflux disease), HLD (hyperlipidemia), Hypertension, Hypothyroid, Leukocytosis, Osteoarthritis of hip, and Sessile colonic polyp (10/04/2017). Past Surgical History Patient  has a past surgical history that includes Thyroid lobectomy; Hernia repair; Total hip arthroplasty (Right, 01/28/2016); and Eye surgery  (1987). Family History: Patient family history includes Alcohol abuse in her brother; Arthritis in her mother; Breast cancer in her mother; Diabetes in her father; Heart disease in her brother, brother, and father; Kidney disease in her father; Lung cancer in her brother and brother; Schizophrenia in her mother; Stroke in her sister; Vision loss in her brother. Social History:  Patient  reports that she has been smoking cigarettes.  She has been smoking about 0.50 packs per day. She uses smokeless tobacco. She reports that she drinks alcohol. She reports that she does not use drugs.  Review of Systems: Constitutional: negative for fever or malaise Ophthalmic: negative for photophobia, double vision or loss of vision Cardiovascular: negative for chest pain, dyspnea on exertion, or new LE swelling Respiratory: negative for SOB or persistent cough Gastrointestinal: negative for abdominal pain, change in bowel habits or melena Genitourinary: negative for dysuria or gross hematuria Musculoskeletal: negative for new gait disturbance  or muscular weakness Integumentary: negative for new or persistent rashes Neurological: negative for TIA or stroke symptoms Psychiatric: negative for SI or delusions Allergic/Immunologic: negative for hives  Patient Care Team    Relationship Specialty Notifications Start End  Leamon Arnt, MD PCP - General Family Medicine  12/09/15   [name], MD Consulting Physician Gastroenterology  10/04/17   Workman, [place] Physician Surgery  10/04/17   [name], MD Consulting Physician Orthopedic Surgery  10/04/17     Objective  Vitals: BP 124/78   Pulse 73   Temp 98.1 F (36.7 C)   Ht 5' 5.25" (1.657 m)   Wt 241 lb 12.8 oz (109.7 kg)   BMI 39.93 kg/m  General:  Well developed, well nourished, no acute distress  Psych:  Alert and oriented,normal mood and affect Cardiovascular:  RRR without gallop, rub or murmur, nondisplaced  PMI Respiratory:  Good breath sounds bilaterally, CTAB with normal respiratory effort Gastrointestinal: normal bowel sounds, soft, non-tender, no noted masses. Small umbilical hernia present,  No HSM MSK: no deformities, contusions. No swelling  Assessment  1. Essential hypertension   2. Mixed hyperlipidemia   3. Tobacco dependence   4. Postmenopausal hormone replacement therapy   5. Umbilical hernia without obstruction and without gangrene      Plan   Hypertension follow up: excellent control. Refilled medications. Continue to work on [employer] and limiting tobacco.   Hyperlipidemia f/u: off of statins. Will recheck fasting at upcoming cpe and try zetia if indicated. rec low fat diet.   HRT: resolved. No longer with menopausal sxs.   Hernia: small and mostly asymtpomatic; rec monitoring. If enlarging or becoming intermittently painful, rec surgical consultation for repair. Discussed red flag sxs of incarceration.   Follow up:  1-3 months for CPE; AWV as well.  Commons side effects, risks, benefits, and alternatives for medications and treatment plan prescribed today were discussed, and the patient expressed understanding of the given instructions. Patient is instructed to call or message via MyChart if he/she has any questions or concerns regarding our treatment plan. No barriers to understanding were identified. We discussed Red Flag symptoms and signs in detail. Patient expressed understanding regarding what to do in case of urgent or emergency type symptoms.   Medication list was reconciled, printed and provided to the patient in AVS. Patient instructions and summary information was reviewed with the patient as documented in the AVS. This note was prepared with assistance of Dragon voice recognition software. Occasional wrong-word or sound-a-like substitutions may have occurred due to the inherent limitations of voice recognition software  No orders of the defined types were  placed in this encounter.  Meds ordered this encounter  Medications  . levothyroxine (SYNTHROID, LEVOTHROID) 137 MCG tablet    Sig: Take 1 tablet (137 mcg total) by mouth at bedtime.    Dispense:  90 tablet    Refill:  3  . losartan-hydrochlorothiazide (HYZAAR) 50-12.5 MG tablet    Sig: Take 1 tablet by mouth daily.    Dispense:  90 tablet    Refill:  3  . omeprazole (PRILOSEC) 20 MG capsule    Sig: Take 1 capsule (20 mg total) by mouth daily.    Dispense:  90 capsule    Refill:  3

## 2017-10-04 NOTE — Patient Instructions (Addendum)
It was so good seeing you again! Thank you for establishing with my new practice and allowing me to continue caring for you. It means a lot to me.   Please schedule a follow up appointment with me in 3 months for your complete physical.   Medicare recommends an Annual Wellness Visit for all patients. Please schedule this to be done with our Nurse Educator, Maudie Mercury. This is an informative "talk" visit; it's goals are to ensure that your health care needs are being met and to give you education regarding avoiding falls, ensuring you are not suffering from depression or problems with memory or thinking, and to educate you on Advance Care Planning. It helps me take good care of you!    Umbilical Hernia, Adult A hernia is a bulge of tissue that pushes through an opening between muscles. An umbilical hernia happens in the abdomen, near the belly button (umbilicus). The hernia may contain tissues from the small intestine, large intestine, or fatty tissue covering the intestines (omentum). Umbilical hernias in adults tend to get worse over time, and they require surgical treatment. There are several types of umbilical hernias. You may have:  A hernia located just above or below the umbilicus (indirect hernia). This is the most common type of umbilical hernia in adults.  A hernia that forms through an opening formed by the umbilicus (direct hernia).  A hernia that comes and goes (reducible hernia). A reducible hernia may be visible only when you strain, lift something heavy, or cough. This type of hernia can be pushed back into the abdomen (reduced).  A hernia that traps abdominal tissue inside the hernia (incarcerated hernia). This type of hernia cannot be reduced.  A hernia that cuts off blood flow to the tissues inside the hernia (strangulated hernia). The tissues can start to die if this happens. This type of hernia requires emergency treatment.  What are the causes? An umbilical hernia happens when  tissue inside the abdomen presses on a weak area of the abdominal muscles. What increases the risk? You may have a greater risk of this condition if you:  Are obese.  Have had several pregnancies.  Have a buildup of fluid inside your abdomen (ascites).  Have had surgery that weakens the abdominal muscles.  What are the signs or symptoms? The main symptom of this condition is a painless bulge at or near the belly button. A reducible hernia may be visible only when you strain, lift something heavy, or cough. Other symptoms may include:  Dull pain.  A feeling of pressure.  Symptoms of a strangulated hernia may include:  Pain that gets increasingly worse.  Nausea and vomiting.  Pain when pressing on the hernia.  Skin over the hernia becoming red or purple.  Constipation.  Blood in the stool.  How is this diagnosed? This condition may be diagnosed based on:  A physical exam. You may be asked to cough or strain while standing. These actions increase the pressure inside your abdomen and force the hernia through the opening in your muscles. Your health care provider may try to reduce the hernia by pressing on it.  Your symptoms and medical history.  How is this treated? Surgery is the only treatment for an umbilical hernia. Surgery for a strangulated hernia is done as soon as possible. If you have a small hernia that is not incarcerated, you may need to lose weight before having surgery. Follow these instructions at home:  Lose weight, if told by your  health care provider.  Do not try to push the hernia back in.  Watch your hernia for any changes in color or size. Tell your health care provider if any changes occur.  You may need to avoid activities that increase pressure on your hernia.  Do not lift anything that is heavier than 10 lb (4.5 kg) until your health care provider says that this is safe.  Take over-the-counter and prescription medicines only as told by your  health care provider.  Keep all follow-up visits as told by your health care provider. This is important. Contact a health care provider if:  Your hernia gets larger.  Your hernia becomes painful. Get help right away if:  You develop sudden, severe pain near the area of your hernia.  You have pain as well as nausea or vomiting.  You have pain and the skin over your hernia changes color.  You develop a fever. This information is not intended to replace advice given to you by your health care provider. Make sure you discuss any questions you have with your health care provider. Document Released: 01/09/2016 Document Revised: 04/11/2016 Document Reviewed: 01/09/2016 Elsevier Interactive Patient Education  [employer].

## 2017-10-05 ENCOUNTER — Ambulatory Visit
Admission: RE | Admit: 2017-10-05 | Discharge: 2017-10-05 | Disposition: A | Discharge status: Home / Self Care | Payer: Medicare HMO | Source: Ambulatory Visit | Attending: Family Medicine | Admitting: Family Medicine

## 2017-10-05 DIAGNOSIS — N6320 Unspecified lump in the left breast, unspecified quadrant: Secondary | ICD-10-CM

## 2017-11-09 ENCOUNTER — Encounter: Payer: Self-pay | Admitting: Family Medicine

## 2017-11-14 ENCOUNTER — Ambulatory Visit (INDEPENDENT_AMBULATORY_CARE_PROVIDER_SITE_OTHER): Payer: Medicare HMO | Admitting: Family Medicine

## 2017-11-14 ENCOUNTER — Other Ambulatory Visit: Payer: Self-pay

## 2017-11-14 ENCOUNTER — Encounter: Payer: Self-pay | Admitting: Family Medicine

## 2017-11-14 VITALS — BP 122/80 | HR 74 | Temp 97.9°F | Resp 16 | Ht 65.25 in | Wt 247.8 lb

## 2017-11-14 DIAGNOSIS — F5105 Insomnia due to other mental disorder: Secondary | ICD-10-CM

## 2017-11-14 DIAGNOSIS — M8949 Other hypertrophic osteoarthropathy, multiple sites: Secondary | ICD-10-CM

## 2017-11-14 DIAGNOSIS — M150 Primary generalized (osteo)arthritis: Secondary | ICD-10-CM

## 2017-11-14 DIAGNOSIS — F4321 Adjustment disorder with depressed mood: Secondary | ICD-10-CM | POA: Diagnosis not present

## 2017-11-14 DIAGNOSIS — F4320 Adjustment disorder, unspecified: Secondary | ICD-10-CM

## 2017-11-14 DIAGNOSIS — H00021 Hordeolum internum right upper eyelid: Secondary | ICD-10-CM | POA: Diagnosis not present

## 2017-11-14 DIAGNOSIS — M159 Polyosteoarthritis, unspecified: Secondary | ICD-10-CM

## 2017-11-14 MED ORDER — TRAZODONE HCL 50 MG PO TABS: 50.0000 mg | ORAL_TABLET | Freq: Every evening | ORAL | 3 refills | Status: DC | PRN

## 2017-11-14 MED ORDER — ESCITALOPRAM OXALATE 10 MG PO TABS: 10.0000 mg | ORAL_TABLET | Freq: Every day | ORAL | 2 refills | Status: DC

## 2017-11-14 MED ORDER — ERYTHROMYCIN 5 MG/GM OP OINT: 1.0000 "application " | TOPICAL_OINTMENT | Freq: Two times a day (BID) | OPHTHALMIC | 0 refills | Status: AC

## 2017-11-14 NOTE — Patient Instructions (Signed)
Please return in 4 weeks for recheck  If you have any questions or concerns, please don't hesitate to send me a message via MyChart or call the office at [phone]. Thank you for visiting with [name] today! It's our pleasure caring for you.  Depression Medications:  Taking the medicine as directed and not missing any doses is one of the best things you can do to treat your depression.  Here are some things to keep in mind:  1) Side effects (stomach upset, some increased anxiety) may happen before you notice a benefit.  These side effects typically go away over time. 2) Changes to your dose of medicine or a change in medication all together is sometimes necessary 3) Most people need to be on medication at least 6-12 months 4) Many people will notice an improvement within two weeks but the full effect of the medication can take up to 4-6 weeks 5) Stopping the medication when you start feeling better often results in a return of symptoms 6) If you start having thoughts of hurting yourself or others after starting this medicine, please call the office immediately at [phone].

## 2017-11-14 NOTE — Progress Notes (Signed)
Subjective  CC:  Chief Complaint  Patient presents with  . Follow-up    Arhritis is worse, has not been feeling well lately    HPI: Maria Marquez is a 72 y.o. female who presents to the office today to address the problems listed above in the chief complaint.  She set up appt due to hand pain due to arthritis: has dip swelling and pain. Also with oa in knees and feet. No red warm painful joints. Uses tylenol and pain is intermittent. Wants to do something so it "doesn't worsen".   Saw allergist last week. On zyrtec and recommended an albuterol inhaler for allergy associated wheezing: dust etc.   Left upper eye lid is swollen and red x several days. Some crusting and soreness. No red eye  Or painful eye.    c/o "not feeling well". She is depressed: apathetic, grieving death of husband 7 months ago, not getting out or socializing, eating poorly, sleeping for days at a time and seems hopeless. At first: I dont' want to take medication. She isn't wanting to go to support groups. Not sure what to do. Not sleeping well: 2 hours at a time. Tired. Not suicidal "due to my religion". She was treated for depression once years ago when her husband first became sick: Brewing technologist - took for 6 months. Made her apathetic and gain weight.  I reviewed the patients updated PMH, FH, and SocHx.    Patient Active Problem List   Diagnosis Date Noted  . Tobacco dependence 12/09/2015    Priority: High  . Essential hypertension 06/17/2015    Priority: High  . Mixed hyperlipidemia 06/17/2015    Priority: High  . Obesity, Class II, BMI 35-39.9, with comorbidity 06/17/2015    Priority: High  . Postmenopausal hormone replacement therapy 06/17/2015    Priority: High  . Postoperative hypothyroidism 06/17/2015    Priority: High  . Primary osteoarthritis involving multiple joints 06/17/2015    Priority: High  . Sessile colonic polyp 10/04/2017    Priority: Medium  . Status post right hip replacement 07/21/2015      Priority: Medium  . ACE inhibitor intolerance 06/17/2015    Priority: Medium  . DJD (degenerative joint disease), lumbar 06/17/2015    Priority: Medium  . Gastroesophageal reflux disease without esophagitis 06/17/2015    Priority: Medium  . Hematuria 07/28/2015    Priority: Low   Current Meds  Medication Sig  . aspirin 81 MG chewable tablet Chew by mouth daily.  [name] azelastine (OPTIVAR) 0.05 % ophthalmic solution Place one drop into both eyes 2 (two) times daily.  [name] EPINEPHrine (EPIPEN 2-PAK) 0.3 mg/0.3 mL IJ SOAJ injection as needed.  [name] levothyroxine (SYNTHROID, LEVOTHROID) 137 MCG tablet Take 1 tablet (137 mcg total) by mouth at bedtime.  [name] losartan-hydrochlorothiazide (HYZAAR) 50-12.5 MG tablet Take 1 tablet by mouth daily.  [name] omeprazole (PRILOSEC) 20 MG capsule Take 1 capsule (20 mg total) by mouth daily.    Allergies: Patient is allergic to rosuvastatin; eggs or egg-derived products; shellfish allergy; sulfa antibiotics; latex; and lisinopril. Family History: Patient family history includes Alcohol abuse in her brother; Arthritis in her mother; Breast cancer in her mother; Diabetes in her father; Heart disease in her brother, brother, and father; Kidney disease in her father; Lung cancer in her brother and brother; Schizophrenia in her mother; Stroke in her sister; Vision loss in her brother. Social History:  Patient  reports that she has been smoking cigarettes.  She has been smoking about  0.50 packs per day. She uses smokeless tobacco. She reports that she drinks alcohol. She reports that she does not use drugs.  Review of Systems: Constitutional: Negative for fever malaise or anorexia Cardiovascular: negative for chest pain Respiratory: negative for SOB or persistent cough Gastrointestinal: negative for abdominal pain  Objective  Vitals: BP 122/80   Pulse 74   Temp 97.9 F (36.6 C) (Oral)   Resp 16   Ht 5' 5.25" (1.657 m)   Wt 247 lb 12.8 oz (112.4 kg)   SpO2 95%    BMI 40.92 kg/m  General: no acute distress , A&Ox3, flat affect, tearful HEENT: PEERL, conjunctiva normal, left upper lid with localized swelling and erythema w/o crusting, Oropharynx moist,neck is supple Cardiovascular:  RRR without murmur or gallop.  Respiratory:  Good breath sounds bilaterally, CTAB with normal respiratory effort Skin:  Warm, no rashes MSK: hands with DIP changes c/w OA.   Assessment  1. Primary osteoarthritis involving multiple joints   2. Insomnia secondary to situational depression   3. Grief reaction   4. Hordeolum internum of right upper eyelid      Plan   OA:  Educated on difference between OA and RA. Tylenol as needed. No other therapies indicated now.   Counseling done for reactive depression and insomnia. Not doing well at all. Start lexapro and trazadone and close f/u.   emycin and warm soaks for eye lid infection/stye.  Follow up: 3-4 week f/u on depression and sleep    Commons side effects, risks, benefits, and alternatives for medications and treatment plan prescribed today were discussed, and the patient expressed understanding of the given instructions. Patient is instructed to call or message via MyChart if he/she has any questions or concerns regarding our treatment plan. No barriers to understanding were identified. We discussed Red Flag symptoms and signs in detail. Patient expressed understanding regarding what to do in case of urgent or emergency type symptoms.   Medication list was reconciled, printed and provided to the patient in AVS. Patient instructions and summary information was reviewed with the patient as documented in the AVS. This note was prepared with assistance of Dragon voice recognition software. Occasional wrong-word or sound-a-like substitutions may have occurred due to the inherent limitations of voice recognition software  No orders of the defined types were placed in this encounter.  Meds ordered this encounter    Medications  . erythromycin ophthalmic ointment    Sig: Place 1 application into the left eye 2 (two) times daily for 7 days.    Dispense:  3.5 g    Refill:  0  . escitalopram (LEXAPRO) 10 MG tablet    Sig: Take 1 tablet (10 mg total) by mouth daily.    Dispense:  30 tablet    Refill:  2  . traZODone (DESYREL) 50 MG tablet    Sig: Take 1-2 tablets (50-100 mg total) by mouth at bedtime as needed for sleep.    Dispense:  60 tablet    Refill:  3

## 2017-12-12 ENCOUNTER — Ambulatory Visit (INDEPENDENT_AMBULATORY_CARE_PROVIDER_SITE_OTHER): Payer: Medicare HMO | Admitting: Family Medicine

## 2017-12-12 ENCOUNTER — Encounter: Payer: Self-pay | Admitting: Family Medicine

## 2017-12-12 VITALS — BP 142/80 | HR 78 | Temp 97.9°F | Ht 65.25 in | Wt 249.6 lb

## 2017-12-12 DIAGNOSIS — I10 Essential (primary) hypertension: Secondary | ICD-10-CM | POA: Diagnosis not present

## 2017-12-12 DIAGNOSIS — F5105 Insomnia due to other mental disorder: Secondary | ICD-10-CM

## 2017-12-12 DIAGNOSIS — F4321 Adjustment disorder with depressed mood: Secondary | ICD-10-CM | POA: Diagnosis not present

## 2017-12-12 NOTE — Progress Notes (Signed)
Subjective  CC:  Chief Complaint  Patient presents with  . Depression    Hasnt started taking medications yet, Unable to take Trazadone due congestion, wants to try Belsomra     HPI: Maria Marquez is a 72 y.o. female who presents to the office today to address the problems listed above in the chief complaint, mood problems.  Depression/grief reaction with secondary insomnia: persists. Pt never started lexapro - fear of weight gain. Instead, wanted to work on sleep. Tried trazadone twice: had significant nasal congestion associated with use. Did sedate her a bit but couldn't breath through nose. Daughter mentioned belsomra. Sleep pattern is very irregular right now. Mood remains low: Phq9- 13 today. Isolating herself, binge eating, low mood, anhedonia. Hasn't seen grief counselor yet but is considering it.She denies current suicidal or homicidal plan or intent.  Depression screen [facility] 2/9 12/12/2017 10/04/2017  Decreased Interest 3 0  Down, Depressed, Hopeless 2 0  PHQ - 2 Score 5 0  Altered sleeping 3 -  Tired, decreased energy 2 -  Change in appetite 2 -  Feeling bad or failure about yourself  0 -  Trouble concentrating 1 -  Moving slowly or fidgety/restless 0 -  Suicidal thoughts 0 -  PHQ-9 Score 13 -  Difficult doing work/chores Somewhat difficult -  I reviewed the patients updated PMH, FH, and SocHx.    Patient Active Problem List   Diagnosis Date Noted  . Tobacco dependence 12/09/2015    Priority: High  . Essential hypertension 06/17/2015    Priority: High  . Mixed hyperlipidemia 06/17/2015    Priority: High  . Obesity, Class II, BMI 35-39.9, with comorbidity 06/17/2015    Priority: High  . Postmenopausal hormone replacement therapy 06/17/2015    Priority: High  . Postoperative hypothyroidism 06/17/2015    Priority: High  . Primary osteoarthritis involving multiple joints 06/17/2015    Priority: High  . Sessile colonic polyp 10/04/2017    Priority: Medium  . Status  post right hip replacement 07/21/2015    Priority: Medium  . ACE inhibitor intolerance 06/17/2015    Priority: Medium  . DJD (degenerative joint disease), lumbar 06/17/2015    Priority: Medium  . Gastroesophageal reflux disease without esophagitis 06/17/2015    Priority: Medium  . Hematuria 07/28/2015    Priority: Low   Current Meds  Medication Sig  . aspirin 81 MG chewable tablet Chew by mouth daily.  [name] azelastine (OPTIVAR) 0.05 % ophthalmic solution Place one drop into both eyes 2 (two) times daily.  [name] EPINEPHrine (EPIPEN 2-PAK) 0.3 mg/0.3 mL IJ SOAJ injection as needed.  [name] levothyroxine (SYNTHROID, LEVOTHROID) 137 MCG tablet Take 1 tablet (137 mcg total) by mouth at bedtime.  [name] losartan-hydrochlorothiazide (HYZAAR) 50-12.5 MG tablet Take 1 tablet by mouth daily.  . montelukast (SINGULAIR) 10 MG tablet   . omeprazole (PRILOSEC) 20 MG capsule Take 1 capsule (20 mg total) by mouth daily.  [name] Spacer/Aero-Holding Chambers (AEROCHAMBER PLUS FLO-VU) MISC   . VENTOLIN HFA 108 (90 Base) MCG/ACT inhaler     Allergies: Patient is allergic to rosuvastatin; shellfish allergy; sulfa antibiotics; latex; and lisinopril. Family history:  Patient family history includes Alcohol abuse in her brother; Arthritis in her mother; Breast cancer in her mother; Diabetes in her father; Heart disease in her brother, brother, and father; Kidney disease in her father; Lung cancer in her brother and brother; Schizophrenia in her mother; Stroke in her sister; Vision loss in her brother. Social History   Socioeconomic  History  . Marital status: Widowed    Spouse name: Not on file  . Number of children: Not on file  . Years of education: Not on file  . Highest education level: Not on file  Occupational History  . Not on file  Social Needs  . Financial resource strain: Not on file  . Food insecurity:    Worry: Not on file    Inability: Not on file  . Transportation needs:    Medical: Not on file     Non-medical: Not on file  Tobacco Use  . Smoking status: Current Every Day Smoker    Packs/day: 0.50    Types: Cigarettes  . Smokeless tobacco: Current User  Substance and Sexual Activity  . Alcohol use: Yes    Comment: rarely  . Drug use: No  . Sexual activity: Never  Lifestyle  . Physical activity:    Days per week: Not on file    Minutes per session: Not on file  . Stress: Not on file  Relationships  . Social connections:    Talks on phone: Not on file    Gets together: Not on file    Attends religious service: Not on file    Active member of club or organization: Not on file    Attends meetings of clubs or organizations: Not on file    Relationship status: Not on file  Other Topics Concern  . Not on file  Social History Narrative  . Not on file     Review of Systems: Constitutional: Negative for fever malaise or anorexia Cardiovascular: negative for chest pain Respiratory: negative for SOB or persistent cough Gastrointestinal: negative for abdominal pain Wt Readings from Last 3 Encounters:  12/12/17 249 lb 9.6 oz (113.2 kg)  11/14/17 247 lb 12.8 oz (112.4 kg)  10/04/17 241 lb 12.8 oz (109.7 kg)   BP Readings from Last 3 Encounters:  12/12/17 (!) 142/80  11/14/17 122/80  10/04/17 124/78    Objective  Vitals: BP (!) 142/80   Pulse 78   Temp 97.9 F (36.6 C)   Ht 5' 5.25" (1.657 m)   Wt 249 lb 9.6 oz (113.2 kg)   BMI 41.22 kg/m  General: no acute distress, well appearing, no apparent distress, well groomed Psych:  Alert and oriented x 3,depressed and flat affect, hypokinetic, good insight and nl speech.   Assessment  1. Grief reaction   2. Insomnia secondary to situational depression   3. Essential hypertension      Plan   Depression/insomnia:  Counseling done; favor treating mood to help sleep. Pt will start lexapro. Discussed expectations; should not affect weight and actually can help if improves mood - can increase activity and improve eating  habits if feels better. Trial of melatonin. Add tricyclic if need more help with sleep at f/u.  HTN: elevated today. ? Stress related or due to weight gain. Recheck next visit and adjust meds up if remains elevated at that time.  Reviewed concept of mood problems caused by biochemical imbalance of neurotransmitters and rationale for treatment with medications and therapy.   Counseling given: pt was instructed to contact office, on-call physician or crisis Hotline if symptoms worsen significantly. If patient develops any suicidal or homicidal thoughts, she is directed to the ER immediately.   Follow up: 6-8 weeks for recheck mood    Commons side effects, risks, benefits, and alternatives for medications and treatment plan prescribed today were discussed, and the patient expressed understanding of the  given instructions. Patient is instructed to call or message via MyChart if he/she has any questions or concerns regarding our treatment plan. No barriers to understanding were identified. We discussed Red Flag symptoms and signs in detail. Patient expressed understanding regarding what to do in case of urgent or emergency type symptoms.   Medication list was reconciled, printed and provided to the patient in AVS. Patient instructions and summary information was reviewed with the patient as documented in the AVS. This note was prepared with assistance of Dragon voice recognition software. Occasional wrong-word or sound-a-like substitutions may have occurred due to the inherent limitations of voice recognition software  No orders of the defined types were placed in this encounter.  No orders of the defined types were placed in this encounter.

## 2017-12-12 NOTE — Patient Instructions (Addendum)
Please return in 6-8 weeks to recheck your mood and blood pressure.  Send me a note if you are having problems.   If you have any questions or concerns, please don't hesitate to send me a message via MyChart or call the office at [phone]. Thank you for visiting with [name] today! It's our pleasure caring for you.  Please give the lexapro a try. It may really help both your mood and your sleep.   Please consider grief counseling.   Melatonin 5mg  nightly may be helpful in addition to the lexapro.   Depression Medications:  Taking the medicine as directed and not missing any doses is one of the best things you can do to treat your depression.  Here are some things to keep in mind:  1) Side effects (stomach upset, some increased anxiety) may happen before you notice a benefit.  These side effects typically go away over time. 2) Changes to your dose of medicine or a change in medication all together is sometimes necessary 3) Most people need to be on medication at least 6-12 months 4) Many people will notice an improvement within two weeks but the full effect of the medication can take up to 4-6 weeks 5) Stopping the medication when you start feeling better often results in a return of symptoms 6) If you start having thoughts of hurting yourself or others after starting this medicine, please call the office immediately at [phone].

## 2017-12-21 ENCOUNTER — Telehealth: Payer: Self-pay | Admitting: Family Medicine

## 2017-12-21 NOTE — Telephone Encounter (Signed)
Copied from [place] [id number]. Topic: Quick Communication - Rx Refill/Question >> Dec 21, 2017  1:51 PM Margot Ables wrote: Reason for CRM: requesting RX for escitalopram (LEXAPRO) 10 MG tablet - requesting 90 day supply (more cost effective for the patient) She states RX request was faxed 12/13/17 to Dr. Jonni Sanger She states best fax# to send to is [phone]

## 2017-12-21 NOTE — Telephone Encounter (Signed)
Lexapro 90 day supply requested from [place] at [facility]. She states RX request was faxed 12/13/17 to Dr. Jonni Sanger. She states best fax# to send to is [phone].   Last OV:12/12/17 Last filled:11/14/17 30 tab/2 refills PCP: [place]: [facility] - [place], [place] [phone] (Phone) [phone] (Fax)

## 2017-12-22 MED ORDER — ESCITALOPRAM OXALATE 10 MG PO TABS: 10.0000 mg | ORAL_TABLET | Freq: Every day | ORAL | 1 refills | Status: DC

## 2017-12-22 NOTE — Telephone Encounter (Signed)
Prescription sent to [facility].

## 2018-01-03 ENCOUNTER — Encounter: Payer: Medicare HMO | Admitting: Family Medicine

## 2018-01-13 ENCOUNTER — Encounter: Payer: Self-pay | Admitting: Family Medicine

## 2018-01-13 ENCOUNTER — Other Ambulatory Visit: Payer: Self-pay

## 2018-01-13 ENCOUNTER — Ambulatory Visit (INDEPENDENT_AMBULATORY_CARE_PROVIDER_SITE_OTHER): Payer: Medicare HMO | Admitting: Family Medicine

## 2018-01-13 VITALS — BP 120/76 | HR 75 | Temp 97.7°F | Ht 65.25 in | Wt 242.6 lb

## 2018-01-13 DIAGNOSIS — E2839 Other primary ovarian failure: Secondary | ICD-10-CM

## 2018-01-13 DIAGNOSIS — Z0000 Encounter for general adult medical examination without abnormal findings: Secondary | ICD-10-CM

## 2018-01-13 DIAGNOSIS — F4321 Adjustment disorder with depressed mood: Secondary | ICD-10-CM | POA: Diagnosis not present

## 2018-01-13 DIAGNOSIS — F17200 Nicotine dependence, unspecified, uncomplicated: Secondary | ICD-10-CM

## 2018-01-13 DIAGNOSIS — I10 Essential (primary) hypertension: Secondary | ICD-10-CM | POA: Diagnosis not present

## 2018-01-13 DIAGNOSIS — E782 Mixed hyperlipidemia: Secondary | ICD-10-CM | POA: Diagnosis not present

## 2018-01-13 DIAGNOSIS — K429 Umbilical hernia without obstruction or gangrene: Secondary | ICD-10-CM | POA: Insufficient documentation

## 2018-01-13 DIAGNOSIS — E890 Postprocedural hypothyroidism: Secondary | ICD-10-CM

## 2018-01-13 LAB — COMPREHENSIVE METABOLIC PANEL
ALT: 14 U/L (ref 0–35)
AST: 12 U/L (ref 0–37)
Albumin: 4.2 g/dL (ref 3.5–5.2)
Alkaline Phosphatase: 75 U/L (ref 39–117)
BUN: 22 mg/dL (ref 6–23)
CO2: 25 mEq/L (ref 19–32)
Calcium: 9.3 mg/dL (ref 8.4–10.5)
Chloride: 101 mEq/L (ref 96–112)
Creatinine, Ser: 1.15 mg/dL (ref 0.40–1.20)
GFR: 49.59 mL/min — ABNORMAL LOW (ref >60.00)
Glucose, Bld: 123 mg/dL — ABNORMAL HIGH (ref 70–99)
Potassium: 4.1 mEq/L (ref 3.5–5.1)
Sodium: 136 mEq/L (ref 135–145)
Total Bilirubin: 0.6 mg/dL (ref 0.2–1.2)
Total Protein: 6.7 g/dL (ref 6.0–8.3)

## 2018-01-13 LAB — CBC WITH DIFFERENTIAL/PLATELET
Basophils Absolute: 0 10*3/uL (ref 0.0–0.1)
Basophils Relative: 0.6 % (ref 0.0–3.0)
Eosinophils Absolute: 0.2 10*3/uL (ref 0.0–0.7)
Eosinophils Relative: 2.5 % (ref 0.0–5.0)
HCT: 42.2 % (ref 36.0–46.0)
Hemoglobin: 14.3 g/dL (ref 12.0–15.0)
Lymphocytes Relative: 19.6 % (ref 12.0–46.0)
Lymphs Abs: 1.6 10*3/uL (ref 0.7–4.0)
MCHC: 33.8 g/dL (ref 30.0–36.0)
MCV: 91.4 fl (ref 78.0–100.0)
Monocytes Absolute: 0.6 10*3/uL (ref 0.1–1.0)
Monocytes Relative: 7 % (ref 3.0–12.0)
Neutro Abs: 5.7 10*3/uL (ref 1.4–7.7)
Neutrophils Relative %: 70.3 % (ref 43.0–77.0)
Platelets: 281 10*3/uL (ref 150.0–400.0)
RBC: 4.62 Mil/uL (ref 3.87–5.11)
RDW: 14.9 % (ref 11.5–15.5)
WBC: 8 10*3/uL (ref 4.0–10.5)

## 2018-01-13 LAB — LIPID PANEL
Cholesterol: 289 mg/dL — ABNORMAL HIGH (ref 0–200)
HDL: 43.3 mg/dL (ref >39.00)
NonHDL: 245.74
Total CHOL/HDL Ratio: 7
Triglycerides: 265 mg/dL — ABNORMAL HIGH (ref 0.0–149.0)
VLDL: 53 mg/dL — ABNORMAL HIGH (ref 0.0–40.0)

## 2018-01-13 LAB — LDL CHOLESTEROL, DIRECT: Direct LDL: 197 mg/dL

## 2018-01-13 LAB — TSH: TSH: 1.97 u[IU]/mL (ref 0.35–4.50)

## 2018-01-13 MED ORDER — ESCITALOPRAM OXALATE 5 MG PO TABS: 5.0000 mg | ORAL_TABLET | Freq: Every day | ORAL | 3 refills | Status: DC

## 2018-01-13 MED ORDER — EZETIMIBE 10 MG PO TABS
10.0000 mg | ORAL_TABLET | Freq: Every day | ORAL | Status: DC
Start: 2018-01-13 — End: 2018-01-17

## 2018-01-13 NOTE — Patient Instructions (Addendum)
Please schedule a follow up appointment with me in 3 months to recheck mood  Please go to the Lab for blood work.    If you have MyChart, your results will be available to view, please respond through [facility] with questions.  We will schedule follow-up according to results.  Medicare recommends an Annual Wellness Visit for all patients. Please schedule this to be done with our Nurse Educator, Maudie Mercury. This is an informative "talk" visit; it's goals are to ensure that your health care needs are being met and to give you education regarding avoiding falls, ensuring you are not suffering from depression or problems with memory or thinking, and to educate you on Advance Care Planning. It helps me take good care of you!  Orders have been placed for your Bone Density. You will receive a call about scheduling.   Decrease the lexapro to 53m daily.  Zetia 181mhas been sent to the [facility] Please do these things to maintain good health!   Exercise at least 30-45 minutes a day,  4-5 days a week.   Eat a low-fat diet with lots of fruits and vegetables, up to 7-9 servings per day.  Drink plenty of water daily. Try to drink 8 8oz glasses per day.  Seatbelts can save your life. Always wear your seatbelt.  Place Smoke Detectors on every level of your home and check batteries every year.  Schedule an appointment with an eye doctor for an eye exam every 1-2 years  Safe sex - use condoms to protect yourself from STDs if you could be exposed to these types of infections. Use birth control if you do not want to become pregnant and are sexually active.  Avoid heavy alcohol use. If you drink, keep it to less than 2 drinks/day and not every day.  He[place] Choose someone you trust that could speak for you if you became unable to speak for yourself.  Depression is common in our stressful world.If you're feeling down or losing interest in things you normally enjoy, please come  in for a visit.  If anyone is threatening or hurting you, please get help. Physical or Emotional Violence is never OK.

## 2018-01-13 NOTE — Telephone Encounter (Signed)
Ok to send 5mg  tablets?   Doloris Hall,  LPN

## 2018-01-13 NOTE — Progress Notes (Signed)
Subjective  Chief Complaint  Patient presents with  . Annual Exam    doing well, hm up to date   . Depression    medication has given her some episodes of diarrhea but she wants to give it more time  . Hypertension    HPI: Maria Marquez is a 72 y.o. female who presents to [place] at [facility] today for a Female Wellness Visit. She also has the concerns and/or needs as listed above in the chief complaint. These will be addressed in addition to the Health Maintenance Visit.   Wellness Visit: annual visit with health maintenance review and exam without Pap   Doing ok: eating better (in part due to side effect from lexapro) and weight is down 7 pounds. Mood is a bit better; improved while visiting sister in [place]. Mammogram was nl in feb; due for dexa: had nl in 2009, takes Ca/vit D and had been on longterm HRT until December 2019.   ROS: + diarrhea, hot flushes - thinks related to lexapro Lifestyle: Body mass index is 40.06 kg/m. Wt Readings from Last 3 Encounters:  01/13/18 242 lb 9.6 oz (110 kg)  12/12/17 249 lb 9.6 oz (113.2 kg)  11/14/17 247 lb 12.8 oz (112.4 kg)   Diet: general Exercise: rarely,   Chronic disease management visit and/or acute problem visit:  Hypertension f/u: Control is good . Pt reports she is doing well. taking medications as instructed, no medication side effects noted, no TIAs, no chest pain on exertion, no dyspnea on exertion, no swelling of ankles. She denies adverse effects from his BP medications. Compliance with medication is good.   Hyperlipidemia f/u: Patient presents for follow up of lipids.CVR smoker and htn and obesity; intolerant to statins. Never on zetia.   Hypothyroidism f/u: Maria Marquez is a 72 y.o. female who presents for follow up of hypothyroidism. Last TSH showed control was good, and thyroid supplement medication was adjusted accordingly.  Current symptoms: diarrhea and hot fluses . Patient denies change in energy  level, heat / cold intolerance, nervousness, palpitations and weight changes.She has been compliant with the medication. Due for lab recheck.   Stress/ depression on lexapro: feeling a bit better: on meds x 3 weeks; however, feeling a little "flat": can laugh but can't cry.  BP Readings from Last 3 Encounters:  01/13/18 120/76  12/12/17 (!) 142/80  11/14/17 122/80   Wt Readings from Last 3 Encounters:  01/13/18 242 lb 9.6 oz (110 kg)  12/12/17 249 lb 9.6 oz (113.2 kg)  11/14/17 247 lb 12.8 oz (112.4 kg)    Lab Results  Component Value Date   CHOL 140 02/04/2016   Lab Results  Component Value Date   HDL 43 02/04/2016   Lab Results  Component Value Date   LDLCALC 70 02/04/2016   Lab Results  Component Value Date   TRIG 137 02/04/2016   No results found for: CHOLHDL No results found for: LDLDIRECT Lab Results  Component Value Date   CREATININE 1.2 (A) 02/04/2016   BUN 18 02/04/2016   NA 141 02/04/2016   K 4.6 02/04/2016   CL 108 01/30/2016   CO2 26 01/30/2016    The 10-year ASCVD risk score Mikey Bussing DC Jr., et al., 2013) is: 15.4%   Values used to calculate the score:     Age: 89 years     Sex: Female     Is Non-Hispanic African American: No     Diabetic: No  Tobacco smoker: Yes     Systolic Blood Pressure: 941 mmHg     Is BP treated: Yes     HDL Cholesterol: 43 mg/dL     Total Cholesterol: 140 mg/dL    Patient Active Problem List   Diagnosis Date Noted  . Tobacco dependence 12/09/2015    Priority: High  . Essential hypertension 06/17/2015    Priority: High    Overview:  Intolerant to multiple medications: hctz, amlodipine, and aldactone (make her feel 'bad')   . Mixed hyperlipidemia 06/17/2015    Priority: High  . Morbid obesity ([place]) 06/17/2015    Priority: High  . Postoperative hypothyroidism 06/17/2015    Priority: High  . Primary osteoarthritis involving multiple joints 06/17/2015    Priority: High  . Sessile colonic polyp 10/04/2017     Priority: Medium    Colonoscopy 12/2015, Dr. Fuller Plan, repeat q 5 years.   . Status post right hip replacement 07/21/2015    Priority: Medium    Overview:  Severe by xray 06/2015   . ACE inhibitor intolerance 06/17/2015    Priority: Medium  . DJD (degenerative joint disease), lumbar 06/17/2015    Priority: Medium    Overview:  'bulging disc' 2008   . Gastroesophageal reflux disease without esophagitis 06/17/2015    Priority: Medium  . Umbilical hernia 74/03/1447    Priority: Low  . Hematuria 07/28/2015    Priority: Low   Health Maintenance  Topic Date Due  . DEXA SCAN  04/17/2013  . INFLUENZA VACCINE  03/23/2018  . MAMMOGRAM  10/05/2018  . COLONOSCOPY  12/22/2020  . TETANUS/TDAP  06/16/2024  . Hepatitis C Screening  Completed  . PNA vac Low Risk Adult  Completed   Immunization History  Administered Date(s) Administered  . Influenza, High Dose Seasonal PF 04/28/2016, 07/19/2017  . Influenza, Seasonal, Injecte, Preservative Fre 04/25/2015  . Influenza-Unspecified 07/12/2017  . Pneumococcal Conjugate-13 06/13/2015  . Pneumococcal Polysaccharide-23 06/10/2016  . Tdap 06/16/2014  . Zoster 05/27/2014   We updated and reviewed the patient's past history in detail and it is documented below. Allergies: Patient is allergic to rosuvastatin; shellfish allergy; sulfa antibiotics; latex; and lisinopril. Past Medical History Patient  has a past medical history of Acute blood loss anemia, Arthritis, Bilious vomiting with nausea, Constipation, GERD (gastroesophageal reflux disease), HLD (hyperlipidemia), Hypertension, Hypothyroid, Leukocytosis, Osteoarthritis of hip, Postmenopausal hormone replacement therapy (06/17/2015), and Sessile colonic polyp (10/04/2017). Past Surgical History Patient  has a past surgical history that includes Thyroid lobectomy; Hernia repair; Total hip arthroplasty (Right, 01/28/2016); and Eye surgery (1987). Family History: Patient family history includes Alcohol  abuse in her brother; Arthritis in her mother; Breast cancer in her mother; Diabetes in her father; Heart disease in her brother, brother, and father; Kidney disease in her father; Lung cancer in her brother and brother; Schizophrenia in her mother; Stroke in her sister; Vision loss in her brother. Social History:  Patient  reports that she has been smoking cigarettes.  She has been smoking about 0.50 packs per day. She uses smokeless tobacco. She reports that she drinks alcohol. She reports that she does not use drugs.  Review of Systems: Constitutional: negative for fever or malaise Ophthalmic: negative for photophobia, double vision or loss of vision Cardiovascular: negative for chest pain, dyspnea on exertion, or new LE swelling Respiratory: negative for SOB or persistent cough Gastrointestinal: negative for abdominal pain, change in bowel habits or melena Genitourinary: negative for dysuria or gross hematuria, no abnormal uterine bleeding or disharge Musculoskeletal:  negative for new gait disturbance or muscular weakness Integumentary: negative for new or persistent rashes, no breast lumps Neurological: negative for TIA or stroke symptoms Psychiatric: negative for SI or delusions Allergic/Immunologic: negative for hives  Patient Care Team    Relationship Specialty Notifications Start End  Leamon Arnt, MD PCP - General Family Medicine  12/09/15   [name], MD Consulting Physician Gastroenterology  10/04/17   Workman, [place] Physician Surgery  10/04/17   [name], MD Consulting Physician Orthopedic Surgery  10/04/17   [name], MD Referring Physician Allergy  11/14/17     Objective  Vitals: BP 120/76   Pulse 75   Temp 97.7 F (36.5 C)   Ht 5' 5.25" (1.657 m)   Wt 242 lb 9.6 oz (110 kg)   BMI 40.06 kg/m  General:  Well developed, well nourished, no acute distress  Psych:  Alert and orientedx3,normal mood and affect HEENT:  Normocephalic,  atraumatic, non-icteric sclera, PERRL, oropharynx is clear without mass or exudate, supple neck without adenopathy, mass or thyromegaly Cardiovascular:  Normal S1, S2, RRR without gallop, rub or murmur, nondisplaced PMI Respiratory:  Good breath sounds bilaterally, CTAB with normal respiratory effort Gastrointestinal: normal bowel sounds, soft, non-tender, no noted masses. No HSM MSK: no deformities, contusions. Joints are without erythema or swelling. Spine and CVA region are nontender Skin:  Warm, no rashes or suspicious lesions noted Neurologic:    Mental status is normal. CN 2-11 are normal. Gross motor and sensory exams are normal. Normal gait. No tremor Breast Exam: No mass, skin retraction or nipple discharge is appreciated in either breast. No axillary adenopathy. Fibrocystic changes are not noted  Assessment  1. Annual physical exam   2. Essential hypertension   3. Mixed hyperlipidemia   4. Morbid obesity ([place])   5. Postoperative hypothyroidism   6. Tobacco dependence      Plan  Female Wellness Visit:  Age appropriate Health Maintenance and Prevention measures were discussed with patient. Included topics are cancer screening recommendations, ways to keep healthy (see AVS) including dietary and exercise recommendations, regular eye and dental care, use of seat belts, and avoidance of moderate alcohol use and tobacco use. Still smoking: not ready to think about quitting. dexa ordered. Continue with weight loss.   BMI: discussed patient's BMI and encouraged positive lifestyle modifications to help get to or maintain a target BMI.  HM needs and immunizations were addressed and ordered. See below for orders. See HM and immunization section for updates.  Routine labs and screening tests ordered including cmp, cbc and lipids where appropriate.  Discussed recommendations regarding Vit D and calcium supplementation (see AVS)  Chronic disease f/u and/or acute problem visit: (deemed  necessary to be done in addition to the wellness visit):  HTN: now well controlled. Check renal function and electrolytes.continue same meds.  Lipids: recheck today and start trial of zetia.   Hypothyroidism: recheck today. Patient with long-standing hypothyroidism, on levothyroxine therapy. she appears euthyroid. she does not appear to have a goiter, thyroid nodules, or neck compression symptoms. We discussed about correct intake of levothyroxine, fasting, with water, separated by at least 30 minutes from breakfast, and separated by more than 4 hours from calcium, iron, multivitamins, acid reflux medications (PPIs). Will check thyroid tests today: TSH, free T4.  If labs today are abnormal, she will need to return in ~6 weeks for repeat labs.   Mood: slightly improved. Sensitive to meds; try cutting back to 5mg  and recheck  in 3 months.   Follow up: 3 months for f/u mood. Needs AWV   Commons side effects, risks, benefits, and alternatives for medications and treatment plan prescribed today were discussed, and the patient expressed understanding of the given instructions. Patient is instructed to call or message via MyChart if he/she has any questions or concerns regarding our treatment plan. No barriers to understanding were identified. We discussed Red Flag symptoms and signs in detail. Patient expressed understanding regarding what to do in case of urgent or emergency type symptoms.   Medication list was reconciled, printed and provided to the patient in AVS. Patient instructions and summary information was reviewed with the patient as documented in the AVS. This note was prepared with assistance of Dragon voice recognition software. Occasional wrong-word or sound-a-like substitutions may have occurred due to the inherent limitations of voice recognition software  Orders Placed This Encounter  Procedures  . CBC with Differential/Platelet  . Comprehensive metabolic panel  . Lipid panel  . HIV  antibody  . TSH   No orders of the defined types were placed in this encounter.

## 2018-01-13 NOTE — Progress Notes (Signed)
Please add on [id number], dx: hyperglycemia Thanks, Dr. Jonni Sanger '

## 2018-01-17 ENCOUNTER — Other Ambulatory Visit (INDEPENDENT_AMBULATORY_CARE_PROVIDER_SITE_OTHER): Payer: Medicare HMO

## 2018-01-17 ENCOUNTER — Other Ambulatory Visit: Payer: Self-pay

## 2018-01-17 ENCOUNTER — Encounter: Payer: Self-pay | Admitting: Family Medicine

## 2018-01-17 DIAGNOSIS — R739 Hyperglycemia, unspecified: Secondary | ICD-10-CM | POA: Diagnosis not present

## 2018-01-17 LAB — HEMOGLOBIN A1C: Hgb A1c MFr Bld: 6.4 % (ref 4.6–6.5)

## 2018-01-17 MED ORDER — EZETIMIBE 10 MG PO TABS: 10.0000 mg | ORAL_TABLET | Freq: Every day | ORAL | 3 refills | Status: DC

## 2018-01-23 ENCOUNTER — Ambulatory Visit: Payer: Medicare HMO | Admitting: Family Medicine

## 2018-01-26 ENCOUNTER — Encounter (INDEPENDENT_AMBULATORY_CARE_PROVIDER_SITE_OTHER): Payer: Medicare HMO

## 2018-02-10 ENCOUNTER — Encounter: Payer: Self-pay | Admitting: Family Medicine

## 2018-02-10 ENCOUNTER — Other Ambulatory Visit: Payer: Self-pay

## 2018-02-10 ENCOUNTER — Ambulatory Visit (INDEPENDENT_AMBULATORY_CARE_PROVIDER_SITE_OTHER): Payer: Medicare HMO | Admitting: Family Medicine

## 2018-02-10 VITALS — BP 130/72 | HR 74 | Temp 97.9°F | Ht 65.25 in | Wt 245.8 lb

## 2018-02-10 DIAGNOSIS — N951 Menopausal and female climacteric states: Secondary | ICD-10-CM | POA: Diagnosis not present

## 2018-02-10 DIAGNOSIS — F4321 Adjustment disorder with depressed mood: Secondary | ICD-10-CM

## 2018-02-10 MED ORDER — ESTRADIOL-NORETHINDRONE ACET 0.5-0.1 MG PO TABS: 1.0000 | ORAL_TABLET | Freq: Every day | ORAL | 5 refills | Status: DC

## 2018-02-10 NOTE — Progress Notes (Signed)
Subjective  CC:  Chief Complaint  Patient presents with  . Night Sweats    stopped taking hormones in December 2018, Night sweats/hot flashes started to returned     HPI: Maria Marquez is a 72 y.o. female who presents to the office today to address the problems listed above in the chief complaint.  Weaned off hrt in December: had been on it for since mid 50's. Did ok at first but now with terrible drenching night sweats again and hot during the day. Negatively affecting sleep. Wants to restart. Has failed black cohash and soy products in past. Doesn't want to try SSRI or gabapentin: tends to be sensitive to medications. Wants to restart hrt: understands risks due to elevated cardiovascular risk: smoker, HTN, obesity.   Depression: feeling better. Stopped the lexapro 2 weeks. Having loose stools which she associated with the lexapro.    Assessment  1. Menopausal vasomotor syndrome   2. Grief reaction        Plan   Hot flushes:  Restart HRT after having risks vs benefits discussion. Pt feels strongly this is the best option for her and accepts risks associated with hrt.   Grief: improving. Stopped meds. Monitor for now.   Follow up: as needed   No orders of the defined types were placed in this encounter.  Meds ordered this encounter  Medications  . Estradiol-Norethindrone Acet 0.5-0.1 MG tablet    Sig: Take 1 tablet by mouth daily.    Dispense:  30 tablet    Refill:  5      I reviewed the patients updated PMH, FH, and SocHx.    Patient Active Problem List   Diagnosis Date Noted  . Tobacco dependence 12/09/2015    Priority: High  . Essential hypertension 06/17/2015    Priority: High  . Mixed hyperlipidemia 06/17/2015    Priority: High  . Morbid obesity ([place]) 06/17/2015    Priority: High  . Post-menopause on HRT (hormone replacement therapy) 06/17/2015    Priority: High  . Postoperative hypothyroidism 06/17/2015    Priority: High  . Primary osteoarthritis  involving multiple joints 06/17/2015    Priority: High  . Sessile colonic polyp 10/04/2017    Priority: Medium  . Status post right hip replacement 07/21/2015    Priority: Medium  . ACE inhibitor intolerance 06/17/2015    Priority: Medium  . DJD (degenerative joint disease), lumbar 06/17/2015    Priority: Medium  . Gastroesophageal reflux disease without esophagitis 06/17/2015    Priority: Medium  . Umbilical hernia 87/86/7672    Priority: Low  . Hematuria 07/28/2015    Priority: Low   Current Meds  Medication Sig  . aspirin 81 MG chewable tablet Chew by mouth daily.  [name] ezetimibe (ZETIA) 10 MG tablet Take 1 tablet (10 mg total) by mouth daily.  [name] gentamicin (GARAMYCIN) 0.3 % ophthalmic solution Place one drop into both eyes every 4 (four) hours. Call provider [phone] any questions  . levothyroxine (SYNTHROID, LEVOTHROID) 137 MCG tablet Take 1 tablet (137 mcg total) by mouth at bedtime.  [name] losartan-hydrochlorothiazide (HYZAAR) 50-12.5 MG tablet Take 1 tablet by mouth daily.  . montelukast (SINGULAIR) 10 MG tablet   . omeprazole (PRILOSEC) 20 MG capsule Take 1 capsule (20 mg total) by mouth daily.  . VENTOLIN HFA 108 (90 Base) MCG/ACT inhaler     Allergies: Patient is allergic to rosuvastatin; shellfish allergy; sulfa antibiotics; latex; and lisinopril. Family History: Patient family history includes Alcohol abuse in her brother;  Arthritis in her mother; Breast cancer in her mother; Diabetes in her father; Heart disease in her brother, brother, and father; Kidney disease in her father; Lung cancer in her brother and brother; Schizophrenia in her mother; Stroke in her sister; Vision loss in her brother. Social History:  Patient  reports that she has been smoking cigarettes.  She has been smoking about 0.50 packs per day. She uses smokeless tobacco. She reports that she drinks alcohol. She reports that she does not use drugs.  Review of Systems: Constitutional: Negative for  fever malaise or anorexia Cardiovascular: negative for chest pain Respiratory: negative for SOB or persistent cough Gastrointestinal: negative for abdominal pain  Objective  Vitals: BP 130/72   Pulse 74   Temp 97.9 F (36.6 C)   Ht 5' 5.25" (1.657 m)   Wt 245 lb 12.8 oz (111.5 kg)   SpO2 96%   BMI 40.59 kg/m  General: no acute distress , A&Ox3   Commons side effects, risks, benefits, and alternatives for medications and treatment plan prescribed today were discussed, and the patient expressed understanding of the given instructions. Patient is instructed to call or message via MyChart if he/she has any questions or concerns regarding our treatment plan. No barriers to understanding were identified. We discussed Red Flag symptoms and signs in detail. Patient expressed understanding regarding what to do in case of urgent or emergency type symptoms.   Medication list was reconciled, printed and provided to the patient in AVS. Patient instructions and summary information was reviewed with the patient as documented in the AVS. This note was prepared with assistance of Dragon voice recognition software. Occasional wrong-word or sound-a-like substitutions may have occurred due to the inherent limitations of voice recognition software

## 2018-02-10 NOTE — Patient Instructions (Signed)
Please follow up if symptoms do not improve or as needed.    Menopause and Hormone Replacement Therapy What is hormone replacement therapy? Hormone replacement therapy (HRT) is the use of artificial (synthetic) hormones to replace hormones that your body stops producing during menopause. Menopause is the normal time of life when menstrual periods stop completely and the ovaries stop producing the female hormones estrogen and progesterone. This lack of hormones can affect your health and cause undesirable symptoms. HRT can relieve some of those symptoms. What are my options for HRT? HRT may consist of the synthetic hormones estrogen and progestin, or it may consist of only estrogen (estrogen-only therapy). You and your health care provider will decide which form of HRT is best for you. If you choose to be on HRT and you have a uterus, estrogen and progestin are usually prescribed. Estrogen-only therapy is used for women who do not have a uterus. Possible options for taking HRT include:  Pills.  Patches.  Gels.  Sprays.  Vaginal cream.  Vaginal rings.  Vaginal inserts.  The amount of hormone(s) that you take and how long you take the hormone(s) varies depending on your individual health. It is important to:  Begin HRT with the lowest possible dosage.  Stop HRT as soon as your health care provider tells you to stop.  Work with your health care provider so that you feel informed and comfortable with your decisions.  What are the benefits of HRT? HRT can reduce the frequency and severity of menopausal symptoms. Benefits of HRT vary depending on the menopausal symptoms that you have, the severity of your symptoms, and your overall health. HRT may help to improve the following menopausal symptoms:  Hot flashes and night sweats. These are sudden feelings of heat that spread over the face and body. The skin may turn red, like a blush. Night sweats are hot flashes that happen while you  are sleeping or trying to sleep.  Bone loss (osteoporosis). The body loses calcium more quickly after menopause, causing the bones to become weaker. This can increase the risk for bone breaks (fractures).  Vaginal dryness. The lining of the vagina can become thin and dry, which can cause pain during sexual intercourse or cause infection, burning, or itching.  Urinary tract infections.  Urinary incontinence. This is a decreased ability to control when you urinate.  Irritability.  Short-term memory problems.  What are the risks of HRT? Risks of HRT vary depending on your individual health and medical history. Risks of HRT also depend on whether you receive both estrogen and progestin or you receive estrogen only.HRT may increase the risk of:  Spotting. This is when a small amount of bloodleaks from the vagina unexpectedly.  Endometrial cancer. This cancer is in the lining of the uterus (endometrium).  Breast cancer.  Increased density of breast tissue. This can make it harder to find breast cancer on a breast X-ray (mammogram).  Stroke.  Heart attack.  Blood clots.  Gallbladder disease.  Risks of HRT can increase if you have any of the following conditions:  Endometrial cancer.  Liver disease.  Heart disease.  Breast cancer.  History of blood clots.  History of stroke.  How should I care for myself while I am on HRT?  Take over-the-counter and prescription medicines only as told by your health care provider.  Get mammograms, pelvic exams, and medical checkups as often as told by your health care provider.  Have Pap tests done as often as  told by your health care provider. A Pap test is sometimes called a Pap smear. It is a screening test that is used to check for signs of cancer of the cervix and vagina. A Pap test can also identify the presence of infection or precancerous changes. Pap tests may be done: ? Every 3 years, starting at age 85. ? Every 5 years,  starting after age 80, in combination with testing for human papillomavirus (HPV). ? More often or less often depending on other medical conditions you have, your age, and other risk factors.  It is your responsibility to get your Pap test results. Ask your health care provider or the department performing the test when your results will be ready.  Keep all follow-up visits as told by your health care provider. This is important. When should I seek medical care? Talk with your health care provider if:  You have any of these: ? Pain or swelling in your legs. ? Shortness of breath. ? Chest pain. ? Lumps or changes in your breasts or armpits. ? Slurred speech. ? Pain, burning, or bleeding when you urine.  You develop any of these: ? Unusual vaginal bleeding. ? Dizziness or headaches. ? Weakness or numbness in any part of your arms or legs. ? Pain in your abdomen.  This information is not intended to replace advice given to you by your health care provider. Make sure you discuss any questions you have with your health care provider. Document Released: 05/08/2003 Document Revised: 07/06/2016 Document Reviewed: 02/10/2015 Elsevier Interactive Patient Education  2017 [employer].

## 2018-02-22 ENCOUNTER — Encounter (INDEPENDENT_AMBULATORY_CARE_PROVIDER_SITE_OTHER): Payer: Self-pay | Admitting: Family Medicine

## 2018-02-22 ENCOUNTER — Ambulatory Visit (INDEPENDENT_AMBULATORY_CARE_PROVIDER_SITE_OTHER): Payer: Medicare HMO | Admitting: Family Medicine

## 2018-02-22 VITALS — BP 122/72 | HR 68 | Temp 98.0°F | Ht 66.0 in | Wt 247.0 lb

## 2018-02-22 DIAGNOSIS — Z0289 Encounter for other administrative examinations: Secondary | ICD-10-CM

## 2018-02-22 DIAGNOSIS — Z1331 Encounter for screening for depression: Secondary | ICD-10-CM

## 2018-02-22 DIAGNOSIS — R0602 Shortness of breath: Secondary | ICD-10-CM | POA: Diagnosis not present

## 2018-02-22 DIAGNOSIS — Z6839 Body mass index (BMI) 39.0-39.9, adult: Secondary | ICD-10-CM | POA: Diagnosis not present

## 2018-02-22 DIAGNOSIS — E890 Postprocedural hypothyroidism: Secondary | ICD-10-CM

## 2018-02-22 DIAGNOSIS — R7303 Prediabetes: Secondary | ICD-10-CM

## 2018-02-22 DIAGNOSIS — R5383 Other fatigue: Secondary | ICD-10-CM | POA: Diagnosis not present

## 2018-02-22 DIAGNOSIS — E559 Vitamin D deficiency, unspecified: Secondary | ICD-10-CM | POA: Diagnosis not present

## 2018-02-23 LAB — COMPREHENSIVE METABOLIC PANEL
ALT: 10 IU/L (ref 0–32)
AST: 9 IU/L (ref 0–40)
Albumin/Globulin Ratio: 1.7 (ref 1.2–2.2)
Albumin: 4 g/dL (ref 3.5–4.8)
Alkaline Phosphatase: 81 IU/L (ref 39–117)
BUN/Creatinine Ratio: 13 (ref 12–28)
BUN: 14 mg/dL (ref 8–27)
Bilirubin Total: 0.5 mg/dL (ref 0.0–1.2)
CO2: 24 mmol/L (ref 20–29)
Calcium: 9 mg/dL (ref 8.7–10.3)
Chloride: 101 mmol/L (ref 96–106)
Creatinine, Ser: 1.05 mg/dL — ABNORMAL HIGH (ref 0.57–1.00)
GFR calc Af Amer: 62 mL/min/{1.73_m2} (ref >59)
GFR calc non Af Amer: 54 mL/min/{1.73_m2} — ABNORMAL LOW (ref >59)
Globulin, Total: 2.3 g/dL (ref 1.5–4.5)
Glucose: 105 mg/dL — ABNORMAL HIGH (ref 65–99)
Potassium: 4.1 mmol/L (ref 3.5–5.2)
Sodium: 140 mmol/L (ref 134–144)
Total Protein: 6.3 g/dL (ref 6.0–8.5)

## 2018-02-23 LAB — CBC WITH DIFFERENTIAL/PLATELET
Basophils Absolute: 0 10*3/uL (ref 0.0–0.2)
Basos: 0 %
EOS (ABSOLUTE): 0.2 10*3/uL (ref 0.0–0.4)
Eos: 2 %
Hematocrit: 40.2 % (ref 34.0–46.6)
Hemoglobin: 13.3 g/dL (ref 11.1–15.9)
Immature Grans (Abs): 0 10*3/uL (ref 0.0–0.1)
Immature Granulocytes: 0 %
Lymphocytes Absolute: 1.7 10*3/uL (ref 0.7–3.1)
Lymphs: 17 %
MCH: 30.4 pg (ref 26.6–33.0)
MCHC: 33.1 g/dL (ref 31.5–35.7)
MCV: 92 fL (ref 79–97)
Monocytes Absolute: 0.7 10*3/uL (ref 0.1–0.9)
Monocytes: 7 %
Neutrophils Absolute: 7 10*3/uL (ref 1.4–7.0)
Neutrophils: 74 %
Platelets: 263 10*3/uL (ref 150–450)
RBC: 4.38 x10E6/uL (ref 3.77–5.28)
RDW: 14.1 % (ref 12.3–15.4)
WBC: 9.7 10*3/uL (ref 3.4–10.8)

## 2018-02-23 LAB — LIPID PANEL
Chol/HDL Ratio: 4.9 ratio — ABNORMAL HIGH (ref 0.0–4.4)
Cholesterol, Total: 210 mg/dL — ABNORMAL HIGH (ref 100–199)
HDL: 43 mg/dL (ref >39)
LDL Calculated: 128 mg/dL — ABNORMAL HIGH (ref 0–99)
Triglycerides: 197 mg/dL — ABNORMAL HIGH (ref 0–149)
VLDL Cholesterol Cal: 39 mg/dL (ref 5–40)

## 2018-02-23 LAB — TSH: TSH: 2.35 u[IU]/mL (ref 0.450–4.500)

## 2018-02-23 LAB — T3: T3, Total: 105 ng/dL (ref 71–180)

## 2018-02-23 LAB — T4, FREE: Free T4: 1.3 ng/dL (ref 0.82–1.77)

## 2018-02-23 LAB — VITAMIN D 25 HYDROXY (VIT D DEFICIENCY, FRACTURES): Vit D, 25-Hydroxy: 12.4 ng/mL — ABNORMAL LOW (ref 30.0–100.0)

## 2018-02-23 LAB — INSULIN, RANDOM: INSULIN: 14 u[IU]/mL (ref 2.6–24.9)

## 2018-02-27 ENCOUNTER — Ambulatory Visit
Admission: RE | Admit: 2018-02-27 | Discharge: 2018-02-27 | Disposition: A | Discharge status: Home / Self Care | Payer: Medicare HMO | Source: Ambulatory Visit | Attending: Family Medicine | Admitting: Family Medicine

## 2018-02-27 DIAGNOSIS — E2839 Other primary ovarian failure: Secondary | ICD-10-CM

## 2018-02-28 ENCOUNTER — Ambulatory Visit: Payer: Medicare HMO | Admitting: Family Medicine

## 2018-03-02 NOTE — Progress Notes (Signed)
.  Office: [phone]  /  Fax: [phone]   HPI:   Chief Complaint: OBESITY  Maria Marquez (MR# [id number]) is a 72 y.o. female who presents on 02/22/2018 for obesity evaluation and treatment. Current BMI is Body mass index is 39.87 kg/m.[name] Maria Marquez has struggled with obesity for years and has been unsuccessful in either losing weight or maintaining long term weight loss. Maria Marquez attended our information session and states she is currently in the action stage of change and ready to dedicate time achieving and maintaining a healthier weight.   Maria Marquez states her desired weight loss is 107 lbs she started gaining weight 65+ her heaviest weight ever was 249 lbs. she has significant food cravings issues  she snacks frequently in the evenings she wakes up frquently in the middle of the night to eat she skips meals frequently she is frequently drinking liquids with calories she frequently makes poor food choices she frequently eats larger portions than normal  she struggles with emotional eating    Fatigue Maria Marquez feels her energy is lower than it should be. This has worsened with weight gain and has not worsened recently. Dazia admits to daytime somnolence and  admits to waking up still tired. Patient is at risk for obstructive sleep apnea. Patent has a history of symptoms of daytime fatigue. Patient generally gets 3 hours of sleep per night, and states they generally have nightime awakenings. Snoring is not present. Apneic episodes are not present. Epworth Sleepiness Score is 6.  Dyspnea on exertion Maria Marquez notes increasing shortness of breath with exercising and seems to be worsening over time with weight gain. She notes getting out of breath sooner with activity than she used to. This has not gotten worse recently. Maria Marquez denies orthopnea.  Pre-Diabetes Maria Marquez has a diagnosis of pre-diabetes based on her elevated Hgb A1c and was informed this puts her at greater risk of developing diabetes. Last  A1c was 6.4, attempting to diet control, she is not on metformin and she notes polyphagia. She denies nausea or hypoglycemia.  Vitamin D Deficiency Maria Marquez has a diagnosis of vitamin D deficiency. She is not on Vit D, She notes fatigue and denies nausea, vomiting or muscle weakness.  Hypothyroidism Maria Marquez has a diagnosis of hypothyroidism. She is on levothyroxine 137 mcg. She denies hot or cold intolerance or palpitations, but does admit to ongoing fatigue.  Depression Screen Maria Marquez's Food and Mood (modified PHQ-9) score was  Depression screen PHQ 2/9 02/22/2018  Decreased Interest 3  Down, Depressed, Hopeless 3  PHQ - 2 Score 6  Altered sleeping 2  Tired, decreased energy 2  Change in appetite 2  Feeling bad or failure about yourself  1  Trouble concentrating 0  Moving slowly or fidgety/restless 0  Suicidal thoughts 0  PHQ-9 Score 13  Difficult doing work/chores Somewhat difficult    ALLERGIES: Allergies  Allergen Reactions  . Rosuvastatin Other (See Comments)    Muscle Pain in Thighs  . Shellfish Allergy Hives  . Sulfa Antibiotics Nausea Only  . Latex Rash  . Lisinopril Cough    MEDICATIONS: Current Outpatient Medications on File Prior to Visit  Medication Sig Dispense Refill  . aspirin 81 MG chewable tablet Chew by mouth daily.    [name] EPINEPHrine (EPIPEN 2-PAK) 0.3 mg/0.3 mL IJ SOAJ injection as needed.    . Estradiol-Norethindrone Acet 0.5-0.1 MG tablet Take 1 tablet by mouth daily. 30 tablet 5  . ezetimibe (ZETIA) 10 MG tablet Take 1 tablet (10 mg total) by mouth  daily. 90 tablet 3  . levothyroxine (SYNTHROID, LEVOTHROID) 137 MCG tablet Take 1 tablet (137 mcg total) by mouth at bedtime. 90 tablet 3  . losartan-hydrochlorothiazide (HYZAAR) 50-12.5 MG tablet Take 1 tablet by mouth daily. 90 tablet 3  . omeprazole (PRILOSEC) 20 MG capsule Take 1 capsule (20 mg total) by mouth daily. 90 capsule 3   No current facility-administered medications on file prior to visit.      PAST MEDICAL HISTORY: Past Medical History:  Diagnosis Date  . Acute blood loss anemia   . Arthritis   . Back pain   . Bilious vomiting with nausea   . Constipation   . GERD (gastroesophageal reflux disease)   . HLD (hyperlipidemia)   . Hypertension   . Hypothyroid   . Joint pain   . Leg edema   . Leukocytosis   . Multiple food allergies   . Osteoarthritis of hip    Right  . Postmenopausal hormone replacement therapy 06/17/2015  . Prediabetes   . Sessile colonic polyp 10/04/2017   Colonoscopy 12/2015, Dr. Fuller Plan, repeat q 5 years.    PAST SURGICAL HISTORY: Past Surgical History:  Procedure Laterality Date  . EYE SURGERY  1987  . HERNIA REPAIR    . THYROID LOBECTOMY     right side  . 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 Smoking status: Current Every Day Smoker    Packs/day: 0.50    Types: Cigarettes  . Smokeless tobacco: Current User  Substance Use Topics  . Alcohol use: Yes    Comment: rarely  . Drug use: No    FAMILY HISTORY: Family History  Problem Relation Age of Onset  . Arthritis Mother   . Breast cancer Mother   . Schizophrenia Mother   . Diabetes Father   . Heart disease Father   . Kidney disease Father   . Stroke Sister   . Lung cancer Brother   . Heart disease Brother   . Lung cancer Brother   . Heart disease Brother   . Vision loss Brother        Glaucoma  . Alcohol abuse Brother     ROS: Review of Systems  Constitutional: Positive for malaise/fatigue. Negative for weight loss.       + Trouble sleeping  HENT:       + Dentures (partials)  Cardiovascular: Negative for orthopnea.  Gastrointestinal: Positive for diarrhea and heartburn. Negative for nausea and vomiting.  Musculoskeletal: Positive for back pain.       Negative muscle  weakness + Muscle or joint pain + Red or swollen joints  Endo/Heme/Allergies:       Negative hot/cold intolerance Positive polyphagia Negative hypoglycemia  Psychiatric/Behavioral: Positive for depression. Negative for suicidal ideas.    PHYSICAL EXAM: Blood pressure 122/72, pulse 68, temperature 98 F (36.7 C), temperature source Oral, height 5\' 6"  (1.676 m), weight 247 lb (112 kg), SpO2 98 %. Body mass index is 39.87 kg/m. Physical Exam  Constitutional: She is oriented to person, place, and time. She appears well-developed and well-nourished.  HENT:  Head: Normocephalic and atraumatic.  Nose: Nose normal.  Eyes: EOM are normal. No scleral icterus.  Neck: Normal range of motion. Neck supple. No thyromegaly present.  Cardiovascular: Normal rate.  Pulmonary/Chest: Effort normal. No respiratory distress.  Abdominal: Soft. There is no tenderness.  + Obesity  Musculoskeletal:  Range of Motion normal in all 4 extremities Trace edema noted in bilateral lower extremities  Neurological: She is alert and oriented to person, place, and time. Coordination normal.  Skin: Skin is warm and dry.  Psychiatric: She has a normal mood and affect. Her behavior is normal.  Vitals reviewed.   RECENT LABS AND TESTS: BMET    Component Value Date/Time   NA 140 02/22/2018 0936   K 4.1 02/22/2018 0936   CL 101 02/22/2018 0936   CO2 24 02/22/2018 0936   GLUCOSE 105 (H) 02/22/2018 0936   GLUCOSE 123 (H) 01/13/2018 0915   BUN 14 02/22/2018 0936   CREATININE 1.05 (H) 02/22/2018 0936   CALCIUM 9.0 02/22/2018 0936   GFRNONAA 54 (L) 02/22/2018 0936   GFRAA 62 02/22/2018 0936   Lab Results  Component Value Date   HGBA1C 6.4 01/17/2018   Lab Results  Component Value Date   INSULIN 14.0 02/22/2018   CBC    Component Value Date/Time   WBC 9.7 02/22/2018 0936   WBC 8.0 01/13/2018 0915   RBC 4.38 02/22/2018 0936   RBC 4.62 01/13/2018 0915   HGB 13.3 02/22/2018 0936   HCT 40.2 02/22/2018  0936   PLT 263 02/22/2018 0936   MCV 92 02/22/2018 0936   MCH 30.4 02/22/2018 0936   MCH 30.9 01/30/2016 0405   MCHC 33.1 02/22/2018 0936   MCHC 33.8 01/13/2018 0915   RDW 14.1 02/22/2018 0936   LYMPHSABS 1.7 02/22/2018 0936   MONOABS 0.6 01/13/2018 0915   EOSABS 0.2 02/22/2018 0936   BASOSABS 0.0 02/22/2018 0936   Iron/TIBC/Ferritin/ %Sat No results found for: IRON, TIBC, FERRITIN, IRONPCTSAT Lipid Panel     Component Value Date/Time   CHOL 210 (H) 02/22/2018 0936   TRIG 197 (H) 02/22/2018 0936   HDL 43 02/22/2018 0936   CHOLHDL 4.9 (H) 02/22/2018 0936   CHOLHDL 7 01/13/2018 0915   VLDL 53.0 (H) 01/13/2018 0915   LDLCALC 128 (H) 02/22/2018 0936   LDLDIRECT 197.0 01/13/2018 0915   Hepatic Function Panel     Component Value Date/Time   PROT 6.3 02/22/2018 0936   ALBUMIN 4.0 02/22/2018 0936   AST 9 02/22/2018 0936   ALT 10 02/22/2018 0936   ALKPHOS 81 02/22/2018 0936   BILITOT 0.5 02/22/2018 0936      Component Value Date/Time   TSH 2.350 02/22/2018 0936   Vitamin D No recent labs  ECG  shows NSR with a rate of 68 BPM INDIRECT CALORIMETER done today shows a VO2 of 210 and a REE of 1464. Her calculated basal metabolic rate is 1610 thus her basal metabolic rate is worse than expected.    ASSESSMENT AND PLAN: Other fatigue - Plan: EKG 12-Lead, CBC with Differential/Platelet, Comprehensive metabolic panel, Lipid panel  Shortness of breath on exertion  Pre-diabetes - Plan: Insulin, random  Vitamin D deficiency - Plan: VITAMIN D 25 Hydroxy (Vit-D Deficiency, Fractures)  Postoperative hypothyroidism - Plan: T3, T4, free, TSH  Screening for depression  Class 2 severe obesity with serious comorbidity and body mass index (BMI) of 39.0 to 39.9 in adult, unspecified obesity type (HCC)  PLAN:  Fatigue Maria Marquez was informed that her fatigue may be related to obesity, depression or many other causes. Labs will be ordered, and in the meanwhile Maria Marquez has agreed to work  on diet, exercise and weight loss to help with fatigue. Proper sleep hygiene was discussed including  the need for 7-8 hours of quality sleep each night. A sleep study was not ordered based on symptoms and Epworth score.  Dyspnea on exertion Hazley's shortness of breath appears to be obesity related and exercise induced. She has agreed to work on weight loss and gradually increase exercise to treat her exercise induced shortness of breath. If Maria Marquez follows our instructions and loses weight without improvement of her shortness of breath, we will plan to refer to pulmonology. We will monitor this condition regularly. Samanda agrees to this plan.  Pre-Diabetes Maria Marquez will continue to work on weight loss, exercise, and decreasing simple carbohydrates in her diet to help decrease the risk of diabetes. We dicussed metformin including benefits and risks. She was informed that eating too many simple carbohydrates or too many calories at one sitting increases the likelihood of GI side effects. Maria Marquez declined metformin for now and a prescription was not written today. We will check labs and Shalah agrees to follow up with our clinic in 2 weeks as directed to monitor her progress.  Vitamin D Deficiency Maria Marquez was informed that low vitamin D levels contributes to fatigue and are associated with obesity, breast, and colon cancer. She will follow up for routine testing of vitamin D, at least 2-3 times per year. She was informed of the risk of over-replacement of vitamin D and agrees to not increase her dose unless she discusses this with [name] first. We will check labs and Kaylei agrees to follow up with our clinic in 2 weeks.  Hypothyroidism Maria Marquez was informed of the importance of good thyroid control to help with weight loss efforts. She was also informed that supertheraputic thyroid levels are dangerous and will not improve weight loss results. We will check labs and Maria Marquez agrees to continue medications as is for now. Maria Marquez  agrees to follow up with our clinic in 2 weeks.  Depression Screen Maria Marquez had a moderately positive depression screening. Depression is commonly associated with obesity and often results in emotional eating behaviors. We will monitor this closely and work on CBT to help improve the non-hunger eating patterns. Referral to Psychology may be required if no improvement is seen as she continues in our clinic.  Obesity Maria Marquez is currently in the action stage of change and her goal is to continue with weight loss efforts She has agreed to follow the Category 2 plan Maria Marquez has been instructed to work up to a goal of 150 minutes of combined cardio and strengthening exercise per week for weight loss and overall health benefits. We discussed the following Behavioral Modification Strategies today: increasing lean protein intake, decreasing simple carbohydrates  and work on meal planning and easy cooking plans  Maria Marquez has agreed to follow up with our clinic in 2 weeks. She was informed of the importance of frequent follow up visits to maximize her success with intensive lifestyle modifications for her multiple health conditions. She was informed we would discuss her lab results at her next visit unless there is a critical issue that needs to be addressed sooner. Wilmarie agreed to keep her next visit at the agreed upon time to discuss these results.    OBESITY BEHAVIORAL INTERVENTION VISIT  Today's visit was # 1 out of 22.  Starting weight: 247 lbs Starting date: 02/22/18 Today's weight : 247 lbs  Today's date: 02/22/2018 Total lbs lost to date: 0 (Patients must lose 7 lbs in the first 6 months to continue with counseling)   ASK: We discussed the diagnosis of  obesity with [name] today and Korina agreed to give [name] permission to discuss obesity behavioral modification therapy today.  ASSESS: Christinea has the diagnosis of obesity and her BMI today is 39.89 Makalyn is in the action stage of change    ADVISE: Elane was educated on the multiple health risks of obesity as well as the benefit of weight loss to improve her health. She was advised of the need for long term treatment and the importance of lifestyle modifications.  AGREE: Multiple dietary modification options and treatment options were discussed and  Adair agreed to the above obesity treatment plan.   I, Trixie Dredge, am acting as transcriptionist for [name], MD  I have reviewed the above documentation for accuracy and completeness, and I agree with the above. -[name], MD

## 2018-03-08 ENCOUNTER — Ambulatory Visit (INDEPENDENT_AMBULATORY_CARE_PROVIDER_SITE_OTHER): Payer: Medicare HMO | Admitting: Family Medicine

## 2018-03-08 VITALS — BP 120/69 | HR 67 | Temp 97.8°F | Ht 66.0 in | Wt 239.0 lb

## 2018-03-08 DIAGNOSIS — E559 Vitamin D deficiency, unspecified: Secondary | ICD-10-CM

## 2018-03-08 DIAGNOSIS — R7303 Prediabetes: Secondary | ICD-10-CM | POA: Diagnosis not present

## 2018-03-08 DIAGNOSIS — Z6838 Body mass index (BMI) 38.0-38.9, adult: Secondary | ICD-10-CM

## 2018-03-08 DIAGNOSIS — E66812 Obesity, class 2: Secondary | ICD-10-CM

## 2018-03-08 MED ORDER — VITAMIN D (ERGOCALCIFEROL) 1.25 MG (50000 UNIT) PO CAPS: 50000.0000 [IU] | ORAL_CAPSULE | ORAL | 0 refills | Status: DC

## 2018-03-08 NOTE — Progress Notes (Signed)
Office: [phone]  /  Fax: [phone]   HPI:   Chief Complaint: OBESITY Maria Marquez is here to discuss her progress with her obesity treatment plan. She is on the  follow the Category 2 plan and is following her eating plan approximately 90 to 95 % of the time. She states she is walking a little more.Maria Marquez has done well with weight loss. She struggled to eat all her food, especially at dinner. Hunger was controlled, but she felt deprived at times around other people who were indulging. Her weight is 239 lb (108.4 kg) today and has had a weight loss of 8 pounds over a period of 2 weeks since her last visit. She has lost 8 lbs since starting treatment with [name].  Vitamin D deficiency Maria Marquez has a diagnosis of vitamin D deficiency. She is not currently taking vit D and denies nausea, vomiting or muscle weakness. Dexa done recently, was within normal limits.   Pre-Diabetes Maria Marquez has a diagnosis of prediabetes. Her last Hgb A1c was at 6.4 and she was informed this puts her at greater risk of developing diabetes. She is not taking metformin currently and continues to work on diet and exercise to decrease risk of diabetes. She has decreased polyphagia and denies nausea or hypoglycemia.  ALLERGIES: Allergies  Allergen Reactions  . Rosuvastatin Other (See Comments)    Muscle Pain in Thighs  . Shellfish Allergy Hives  . Sulfa Antibiotics Nausea Only  . Latex Rash  . Lisinopril Cough    MEDICATIONS: Current Outpatient Medications on File Prior to Visit  Medication Sig Dispense Refill  . aspirin 81 MG chewable tablet Chew by mouth daily.    [name] EPINEPHrine (EPIPEN 2-PAK) 0.3 mg/0.3 mL IJ SOAJ injection as needed.    . Estradiol-Norethindrone Acet 0.5-0.1 MG tablet Take 1 tablet by mouth daily. 30 tablet 5  . ezetimibe (ZETIA) 10 MG tablet Take 1 tablet (10 mg total) by mouth daily. 90 tablet 3  . levothyroxine (SYNTHROID, LEVOTHROID) 137 MCG tablet Take 1 tablet (137 mcg total) by mouth at  bedtime. 90 tablet 3  . losartan-hydrochlorothiazide (HYZAAR) 50-12.5 MG tablet Take 1 tablet by mouth daily. 90 tablet 3  . omeprazole (PRILOSEC) 20 MG capsule Take 1 capsule (20 mg total) by mouth daily. 90 capsule 3   No current facility-administered medications on file prior to visit.     PAST MEDICAL HISTORY: Past Medical History:  Diagnosis Date  . Acute blood loss anemia   . Arthritis   . Back pain   . Bilious vomiting with nausea   . Constipation   . GERD (gastroesophageal reflux disease)   . HLD (hyperlipidemia)   . Hypertension   . Hypothyroid   . Joint pain   . Leg edema   . Leukocytosis   . Multiple food allergies   . Osteoarthritis of hip    Right  . Postmenopausal hormone replacement therapy 06/17/2015  . Prediabetes   . Sessile colonic polyp 10/04/2017   Colonoscopy 12/2015, Dr. Fuller Plan, repeat q 5 years.    PAST SURGICAL HISTORY: Past Surgical History:  Procedure Laterality Date  . EYE SURGERY  1987  . HERNIA REPAIR    . THYROID LOBECTOMY     right side  . 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  Smoking status: Current Every Day Smoker    Packs/day: 0.50    Types: Cigarettes  . Smokeless tobacco: Current User  Substance Use Topics  . Alcohol use: Yes    Comment: rarely  . Drug use: No    FAMILY HISTORY: Family History  Problem Relation Age of Onset  . Arthritis Mother   . Breast cancer Mother   . Schizophrenia Mother   . Diabetes Father   . Heart disease Father   . Kidney disease Father   . Stroke Sister   . Lung cancer Brother   . Heart disease Brother   . Lung cancer Brother   . Heart disease Brother   . Vision loss Brother        Glaucoma  . Alcohol abuse Brother     ROS: Review of Systems  Constitutional: Positive for weight  loss.  Gastrointestinal: Negative for nausea and vomiting.  Musculoskeletal:       Negative for muscle weakness  Endo/Heme/Allergies:       Positive for polyphagia Negative for hypoglycemia    PHYSICAL EXAM: Blood pressure 120/69, pulse 67, temperature 97.8 F (36.6 C), temperature source Oral, height 5\' 6"  (1.676 m), weight 239 lb (108.4 kg), SpO2 99 %. Body mass index is 38.58 kg/m. Physical Exam  Constitutional: She is oriented to person, place, and time. She appears well-developed and well-nourished.  Cardiovascular: Normal rate.  Pulmonary/Chest: Effort normal.  Musculoskeletal: Normal range of motion.  Neurological: She is oriented to person, place, and time.  Skin: Skin is warm and dry.  Psychiatric: She has a normal mood and affect. Her behavior is normal.  Vitals reviewed.   RECENT LABS AND TESTS: BMET    Component Value Date/Time   NA 140 02/22/2018 0936   K 4.1 02/22/2018 0936   CL 101 02/22/2018 0936   CO2 24 02/22/2018 0936   GLUCOSE 105 (H) 02/22/2018 0936   GLUCOSE 123 (H) 01/13/2018 0915   BUN 14 02/22/2018 0936   CREATININE 1.05 (H) 02/22/2018 0936   CALCIUM 9.0 02/22/2018 0936   GFRNONAA 54 (L) 02/22/2018 0936   GFRAA 62 02/22/2018 0936   Lab Results  Component Value Date   HGBA1C 6.4 01/17/2018   HGBA1C 6.1 09/23/2015   Lab Results  Component Value Date   INSULIN 14.0 02/22/2018   CBC    Component Value Date/Time   WBC 9.7 02/22/2018 0936   WBC 8.0 01/13/2018 0915   RBC 4.38 02/22/2018 0936   RBC 4.62 01/13/2018 0915   HGB 13.3 02/22/2018 0936   HCT 40.2 02/22/2018 0936   PLT 263 02/22/2018 0936   MCV 92 02/22/2018 0936   MCH 30.4 02/22/2018 0936   MCH 30.9 01/30/2016 0405   MCHC 33.1 02/22/2018 0936   MCHC 33.8 01/13/2018 0915   RDW 14.1 02/22/2018 0936   LYMPHSABS 1.7 02/22/2018 0936   MONOABS 0.6 01/13/2018 0915   EOSABS 0.2 02/22/2018 0936   BASOSABS 0.0 02/22/2018 0936   Iron/TIBC/Ferritin/ %Sat No results found for:  IRON, TIBC, FERRITIN, IRONPCTSAT Lipid Panel     Component Value Date/Time   CHOL 210 (H) 02/22/2018 0936   TRIG 197 (H) 02/22/2018 0936   HDL 43 02/22/2018 0936   CHOLHDL 4.9 (H) 02/22/2018 0936   CHOLHDL 7 01/13/2018 0915   VLDL 53.0 (H) 01/13/2018 0915   LDLCALC 128 (H) 02/22/2018 0936   LDLDIRECT 197.0 01/13/2018 0915   Hepatic Function Panel     Component Value Date/Time   PROT 6.3 02/22/2018  0936   ALBUMIN 4.0 02/22/2018 0936   AST 9 02/22/2018 0936   ALT 10 02/22/2018 0936   ALKPHOS 81 02/22/2018 0936   BILITOT 0.5 02/22/2018 0936      Component Value Date/Time   TSH 2.350 02/22/2018 0936   TSH 1.97 01/13/2018 0915   TSH 2.51 02/04/2016   TSH 0.215 (L) 11/11/2015 1828   Results for BATINA, DOUGAN "MATTALYNN CRANDLE" (MRN [id number]) as of 03/08/2018 17:08  Ref. Range 02/22/2018 09:36  Vitamin D, 25-Hydroxy Latest Ref Range: 30.0 - 100.0 ng/mL 12.4 (L)   ASSESSMENT AND PLAN: Vitamin D deficiency - Plan: Vitamin D, Ergocalciferol, (DRISDOL) 50000 units CAPS capsule  Prediabetes  Class 2 severe obesity with serious comorbidity and body mass index (BMI) of 38.0 to 38.9 in adult, unspecified obesity type ([place])  PLAN:  Vitamin D Deficiency Maria Marquez was informed that low vitamin D levels contributes to fatigue and are associated with obesity, breast, and colon cancer. She agrees to start prescription Vit D @50 ,000 IU every week #4 with no refills. We will recheck labs in 3 months and she will follow up for routine testing of vitamin D, at least 2-3 times per year. She was informed of the risk of over-replacement of vitamin D and agrees to not increase her dose unless she discusses this with [name] first. Kaija agrees to follow up as directed.  Pre-Diabetes Maria Marquez will continue to work on weight loss, exercise, and decreasing simple carbohydrates in her diet to help decrease the risk of diabetes. She was informed that eating too many simple carbohydrates or too many calories at one  sitting increases the likelihood of GI side effects. We will defer metformin for now and will recheck labs in 3 months. Maria Marquez agreed to follow up with [name] as directed to monitor her progress.  Obesity Maria Marquez is currently in the action stage of change. As such, her goal is to continue with weight loss efforts She has agreed to follow the Category 2 plan Maria Marquez has been instructed to work up to a goal of 150 minutes of combined cardio and strengthening exercise per week for weight loss and overall health benefits. We discussed the following Behavioral Modification Strategies today: increasing lean protein intake and decreasing simple carbohydrates   Dema has agreed to follow up with our clinic in 2 weeks. She was informed of the importance of frequent follow up visits to maximize her success with intensive lifestyle modifications for her multiple health conditions.   OBESITY BEHAVIORAL INTERVENTION VISIT  Today's visit was # 2 out of 22.  Starting weight: 247 lbs Starting date: 02/22/18 Today's weight : 239 lbs Today's date: 03/08/2018 Total lbs lost to date: 8    ASK: We discussed the diagnosis of obesity with Maria Marquez today and Maria Marquez agreed to give [name] permission to discuss obesity behavioral modification therapy today.  ASSESS: Delesa has the diagnosis of obesity and her BMI today is 38.59 Maria Marquez is in the action stage of change   ADVISE: Maria Marquez was educated on the multiple health risks of obesity as well as the benefit of weight loss to improve her health. She was advised of the need for long term treatment and the importance of lifestyle modifications.  AGREE: Multiple dietary modification options and treatment options were discussed and  Maria Marquez agreed to the above obesity treatment plan.  I, Doreene Nest, am acting as transcriptionist for [name], MD  I have reviewed the above documentation for accuracy and completeness, and I agree with  the above. -[name],  MD

## 2018-03-27 ENCOUNTER — Ambulatory Visit (INDEPENDENT_AMBULATORY_CARE_PROVIDER_SITE_OTHER): Payer: Medicare HMO | Admitting: Family Medicine

## 2018-03-27 VITALS — BP 109/68 | HR 69 | Temp 97.8°F | Ht 66.0 in | Wt 236.0 lb

## 2018-03-27 DIAGNOSIS — E559 Vitamin D deficiency, unspecified: Secondary | ICD-10-CM

## 2018-03-27 DIAGNOSIS — R7303 Prediabetes: Secondary | ICD-10-CM

## 2018-03-27 DIAGNOSIS — Z6838 Body mass index (BMI) 38.0-38.9, adult: Secondary | ICD-10-CM | POA: Diagnosis not present

## 2018-03-28 ENCOUNTER — Other Ambulatory Visit (INDEPENDENT_AMBULATORY_CARE_PROVIDER_SITE_OTHER): Payer: Self-pay | Admitting: Family Medicine

## 2018-03-28 DIAGNOSIS — E559 Vitamin D deficiency, unspecified: Secondary | ICD-10-CM

## 2018-03-28 MED ORDER — VITAMIN D (ERGOCALCIFEROL) 1.25 MG (50000 UNIT) PO CAPS: 50000.0000 [IU] | ORAL_CAPSULE | ORAL | 0 refills | Status: DC

## 2018-03-28 NOTE — Progress Notes (Signed)
Office: [phone]  /  Fax: [phone]   HPI:   Chief Complaint: OBESITY Maria Marquez is here to discuss her progress with her obesity treatment plan. She is on the Category 2 plan and is following her eating plan approximately 75 to 80 % of the time. She states she is walking 1/4 to 1/2 mile a day 3 to 4 times per week. Maria Marquez continues to do well with weight loss, even with having company for the last two week. She is disappointed that she hasn't lost weight faster. Her weight is 236 lb (107 kg) today and has had a weight loss of 3 pounds over a period of 2 to 3 weeks since her last visit. She has lost 11 lbs since starting treatment with [name].  Vitamin D deficiency Maria Marquez has a diagnosis of vitamin D deficiency. Maria Marquez is stable on vit D, but she is not yet at goal. She denies nausea, vomiting or muscle weakness.  Pre-Diabetes Maria Marquez has a diagnosis of prediabetes. Her last Hgb A1c was at 6.4 and she was informed this puts her at greater risk of developing diabetes. She is not taking metformin currently. Maria Marquez is working on diet and is doing well. She had added in  exercise to decrease risk of diabetes. She denies nausea or hypoglycemia.  ALLERGIES: Allergies  Allergen Reactions  . Rosuvastatin Other (See Comments)    Muscle Pain in Thighs  . Shellfish Allergy Hives  . Sulfa Antibiotics Nausea Only  . Latex Rash  . Lisinopril Cough    MEDICATIONS: Current Outpatient Medications on File Prior to Visit  Medication Sig Dispense Refill  . aspirin 81 MG chewable tablet Chew by mouth daily.    [name] EPINEPHrine (EPIPEN 2-PAK) 0.3 mg/0.3 mL IJ SOAJ injection as needed.    . Estradiol-Norethindrone Acet 0.5-0.1 MG tablet Take 1 tablet by mouth daily. 30 tablet 5  . ezetimibe (ZETIA) 10 MG tablet Take 1 tablet (10 mg total) by mouth daily. 90 tablet 3  . levothyroxine (SYNTHROID, LEVOTHROID) 137 MCG tablet Take 1 tablet (137 mcg total) by mouth at bedtime. 90 tablet 3  .  losartan-hydrochlorothiazide (HYZAAR) 50-12.5 MG tablet Take 1 tablet by mouth daily. 90 tablet 3  . omeprazole (PRILOSEC) 20 MG capsule Take 1 capsule (20 mg total) by mouth daily. 90 capsule 3   No current facility-administered medications on file prior to visit.     PAST MEDICAL HISTORY: Past Medical History:  Diagnosis Date  . Acute blood loss anemia   . Arthritis   . Back pain   . Bilious vomiting with nausea   . Constipation   . GERD (gastroesophageal reflux disease)   . HLD (hyperlipidemia)   . Hypertension   . Hypothyroid   . Joint pain   . Leg edema   . Leukocytosis   . Multiple food allergies   . Osteoarthritis of hip    Right  . Postmenopausal hormone replacement therapy 06/17/2015  . Prediabetes   . Sessile colonic polyp 10/04/2017   Colonoscopy 12/2015, Dr. Fuller Plan, repeat q 5 years.    PAST SURGICAL HISTORY: Past Surgical History:  Procedure Laterality Date  . EYE SURGERY  1987  . HERNIA REPAIR    . THYROID LOBECTOMY     right side  . TOTAL HIP ARTHROPLASTY Right 01/28/2016   Procedure: RIGHT TOTAL HIP ARTHROPLASTY ANTERIOR APPROACH;  Surgeon: [name], MD;  Location: [facility];  Service: Orthopedics;  Laterality: Right;    SOCIAL HISTORY: Social History   Tobacco  Use  . Smoking status: Current Every Day Smoker    Packs/day: 0.50    Types: Cigarettes  . Smokeless tobacco: Current User  Substance Use Topics  . Alcohol use: Yes    Comment: rarely  . Drug use: No    FAMILY HISTORY: Family History  Problem Relation Age of Onset  . Arthritis Mother   . Breast cancer Mother   . Schizophrenia Mother   . Diabetes Father   . Heart disease Father   . Kidney disease Father   . Stroke Sister   . Lung cancer Brother   . Heart disease Brother   . Lung cancer Brother   . Heart disease Brother   . Vision loss Brother        Glaucoma  . Alcohol abuse Brother     ROS: Review of Systems  Constitutional: Positive for weight loss.  Gastrointestinal:  Negative for nausea and vomiting.  Musculoskeletal:       Negative for muscle weakness  Endo/Heme/Allergies:       Negative for hypoglycemia    PHYSICAL EXAM: Blood pressure 109/68, pulse 69, temperature 97.8 F (36.6 C), temperature source Oral, height 5\' 6"  (1.676 m), weight 236 lb (107 kg), SpO2 96 %. Body mass index is 38.09 kg/m. Physical Exam  Constitutional: She is oriented to person, place, and time. She appears well-developed and well-nourished.  Cardiovascular: Normal rate.  Pulmonary/Chest: Effort normal.  Musculoskeletal: Normal range of motion.  Neurological: She is oriented to person, place, and time.  Skin: Skin is warm and dry.  Psychiatric: She has a normal mood and affect. Her behavior is normal.  Vitals reviewed.   RECENT LABS AND TESTS: BMET    Component Value Date/Time   NA 140 02/22/2018 0936   K 4.1 02/22/2018 0936   CL 101 02/22/2018 0936   CO2 24 02/22/2018 0936   GLUCOSE 105 (H) 02/22/2018 0936   GLUCOSE 123 (H) 01/13/2018 0915   BUN 14 02/22/2018 0936   CREATININE 1.05 (H) 02/22/2018 0936   CALCIUM 9.0 02/22/2018 0936   GFRNONAA 54 (L) 02/22/2018 0936   GFRAA 62 02/22/2018 0936   Lab Results  Component Value Date   HGBA1C 6.4 01/17/2018   HGBA1C 6.1 09/23/2015   Lab Results  Component Value Date   INSULIN 14.0 02/22/2018   CBC    Component Value Date/Time   WBC 9.7 02/22/2018 0936   WBC 8.0 01/13/2018 0915   RBC 4.38 02/22/2018 0936   RBC 4.62 01/13/2018 0915   HGB 13.3 02/22/2018 0936   HCT 40.2 02/22/2018 0936   PLT 263 02/22/2018 0936   MCV 92 02/22/2018 0936   MCH 30.4 02/22/2018 0936   MCH 30.9 01/30/2016 0405   MCHC 33.1 02/22/2018 0936   MCHC 33.8 01/13/2018 0915   RDW 14.1 02/22/2018 0936   LYMPHSABS 1.7 02/22/2018 0936   MONOABS 0.6 01/13/2018 0915   EOSABS 0.2 02/22/2018 0936   BASOSABS 0.0 02/22/2018 0936   Iron/TIBC/Ferritin/ %Sat No results found for: IRON, TIBC, FERRITIN, IRONPCTSAT Lipid Panel       Component Value Date/Time   CHOL 210 (H) 02/22/2018 0936   TRIG 197 (H) 02/22/2018 0936   HDL 43 02/22/2018 0936   CHOLHDL 4.9 (H) 02/22/2018 0936   CHOLHDL 7 01/13/2018 0915   VLDL 53.0 (H) 01/13/2018 0915   LDLCALC 128 (H) 02/22/2018 0936   LDLDIRECT 197.0 01/13/2018 0915   Hepatic Function Panel     Component Value Date/Time   PROT 6.3  02/22/2018 0936   ALBUMIN 4.0 02/22/2018 0936   AST 9 02/22/2018 0936   ALT 10 02/22/2018 0936   ALKPHOS 81 02/22/2018 0936   BILITOT 0.5 02/22/2018 0936      Component Value Date/Time   TSH 2.350 02/22/2018 0936   TSH 1.97 01/13/2018 0915   TSH 2.51 02/04/2016   TSH 0.215 (L) 11/11/2015 1828   Results for RASHANNA, CHRISTIANA "EVOLET SALMINEN" (MRN [id number]) as of 03/28/2018 17:12  Ref. Range 02/22/2018 09:36  Vitamin D, 25-Hydroxy Latest Ref Range: 30.0 - 100.0 ng/mL 12.4 (L)   ASSESSMENT AND PLAN: Vitamin D deficiency - Plan: Vitamin D, Ergocalciferol, (DRISDOL) 50000 units CAPS capsule  Prediabetes  Class 2 severe obesity with serious comorbidity and body mass index (BMI) of 38.0 to 38.9 in adult, unspecified obesity type (Maria Marquez)  PLAN:  Vitamin D Deficiency Maria Marquez was informed that low vitamin D levels contributes to fatigue and are associated with obesity, breast, and colon cancer. She agrees to continue to take prescription Vit D @50 ,000 IU every week #4 with no refills and will follow up for routine testing of vitamin D, at least 2-3 times per year. She was informed of the risk of over-replacement of vitamin D and agrees to not increase her dose unless she discusses this with [name] first. We will recheck labs in 2 months and Maria Marquez agrees to follow up as directed.  Pre-Diabetes Maria Marquez will continue to work on weight loss, exercise, and decreasing simple carbohydrates in her diet to help decrease the risk of diabetes. She was informed that eating too many simple carbohydrates or too many calories at one sitting increases the likelihood of GI  side effects. We will recheck labs in 2 months and Maria Marquez agreed to follow up with [name] as directed to monitor her progress.  Obesity Maria Marquez is currently in the action stage of change. As such, her goal is to continue with weight loss efforts She has agreed to follow the Category 2 plan Maria Marquez has been instructed to work up to a goal of 150 minutes of combined cardio and strengthening exercise per week for weight loss and overall health benefits. 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  Maria Marquez has agreed to follow up with our clinic in 2 to 3 weeks. She was informed of the importance of frequent follow up visits to maximize her success with intensive lifestyle modifications for her multiple health conditions.   OBESITY BEHAVIORAL INTERVENTION VISIT  Today's visit was # 3 out of 22.  Starting weight: 247 lbs Starting date: 02/22/18 Today's weight : 236 lbs Today's date: 03/27/2018 Total lbs lost to date: 11    ASK: We discussed the diagnosis of obesity with Maria Marquez today and Maria Marquez agreed to give [name] permission to discuss obesity behavioral modification therapy today.  ASSESS: Maria Marquez has the diagnosis of obesity and her BMI today is 38.11 Maria Marquez is in the action stage of change   ADVISE: Maria Marquez was educated on the multiple health risks of obesity as well as the benefit of weight loss to improve her health. She was advised of the need for long term treatment and the importance of lifestyle modifications.  AGREE: Multiple dietary modification options and treatment options were discussed and  Maria Marquez agreed to the above obesity treatment plan.  Corey Skains, am  acting as transcriptionist for [name], MD  I have reviewed the above documentation for accuracy and  completeness, and I agree with the above. -[name], MD

## 2018-03-31 ENCOUNTER — Encounter (INDEPENDENT_AMBULATORY_CARE_PROVIDER_SITE_OTHER): Payer: Self-pay | Admitting: Family Medicine

## 2018-04-11 ENCOUNTER — Ambulatory Visit (INDEPENDENT_AMBULATORY_CARE_PROVIDER_SITE_OTHER): Payer: Medicare HMO | Admitting: Family Medicine

## 2018-04-11 VITALS — BP 110/68 | HR 69 | Temp 97.5°F | Ht 66.0 in | Wt 235.0 lb

## 2018-04-11 DIAGNOSIS — E559 Vitamin D deficiency, unspecified: Secondary | ICD-10-CM | POA: Diagnosis not present

## 2018-04-11 DIAGNOSIS — Z6838 Body mass index (BMI) 38.0-38.9, adult: Secondary | ICD-10-CM

## 2018-04-11 NOTE — Progress Notes (Signed)
Office: [phone]  /  Fax: [phone]   HPI:   Chief Complaint: OBESITY Maria Marquez is here to discuss her progress with her obesity treatment plan. She is on the Category 2 plan and is following her eating plan approximately 85 to 90 % of the time. She states she is walking half a mile 3 to 4 times per week. Maria Marquez continues to do well with weight loss. She is journaling her dinner and she has started journaling all of her food. Her weight is 235 lb (106.6 kg) today and has had a weight loss of 1 pounds over a period of 2 weeks since her last visit. She has lost 12 lbs since starting treatment with [name].  Vitamin D deficiency Maria Marquez has a diagnosis of vitamin D deficiency. She is stable on vit D and denies nausea, vomiting or muscle weakness.  ALLERGIES: Allergies  Allergen Reactions  . Rosuvastatin Other (See Comments)    Muscle Pain in Thighs  . Shellfish Allergy Hives  . Sulfa Antibiotics Nausea Only  . Latex Rash  . Lisinopril Cough    MEDICATIONS: Current Outpatient Medications on File Prior to Visit  Medication Sig Dispense Refill  . aspirin 81 MG chewable tablet Chew by mouth daily.    [name] EPINEPHrine (EPIPEN 2-PAK) 0.3 mg/0.3 mL IJ SOAJ injection as needed.    . Estradiol-Norethindrone Acet 0.5-0.1 MG tablet Take 1 tablet by mouth daily. 30 tablet 5  . ezetimibe (ZETIA) 10 MG tablet Take 1 tablet (10 mg total) by mouth daily. 90 tablet 3  . levothyroxine (SYNTHROID, LEVOTHROID) 137 MCG tablet Take 1 tablet (137 mcg total) by mouth at bedtime. 90 tablet 3  . losartan-hydrochlorothiazide (HYZAAR) 50-12.5 MG tablet Take 1 tablet by mouth daily. 90 tablet 3  . omeprazole (PRILOSEC) 20 MG capsule Take 1 capsule (20 mg total) by mouth daily. 90 capsule 3  . Vitamin D, Ergocalciferol, (DRISDOL) 50000 units CAPS capsule Take 1 capsule (50,000 Units total) by mouth every 7 (seven) days. 4 capsule 0   No current facility-administered medications on file prior to visit.     PAST  MEDICAL HISTORY: Past Medical History:  Diagnosis Date  . Acute blood loss anemia   . Arthritis   . Back pain   . Bilious vomiting with nausea   . Constipation   . GERD (gastroesophageal reflux disease)   . HLD (hyperlipidemia)   . Hypertension   . Hypothyroid   . Joint pain   . Leg edema   . Leukocytosis   . Multiple food allergies   . Osteoarthritis of hip    Right  . Postmenopausal hormone replacement therapy 06/17/2015  . Prediabetes   . Sessile colonic polyp 10/04/2017   Colonoscopy 12/2015, Dr. Fuller Plan, repeat q 5 years.    PAST SURGICAL HISTORY: Past Surgical History:  Procedure Laterality Date  . EYE SURGERY  1987  . HERNIA REPAIR    . THYROID LOBECTOMY     right side  . 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 Smoking status: Current Every Day Smoker    Packs/day: 0.50    Types: Cigarettes  . Smokeless tobacco: Current User  Substance Use Topics  . Alcohol use: Yes    Comment: rarely  . Drug use: No    FAMILY HISTORY: Family  History  Problem Relation Age of Onset  . Arthritis Mother   . Breast cancer Mother   . Schizophrenia Mother   . Diabetes Father   . Heart disease Father   . Kidney disease Father   . Stroke Sister   . Lung cancer Brother   . Heart disease Brother   . Lung cancer Brother   . Heart disease Brother   . Vision loss Brother        Glaucoma  . Alcohol abuse Brother     ROS: Review of Systems  Constitutional: Positive for weight loss.  Gastrointestinal: Negative for nausea and vomiting.  Musculoskeletal:       Negative for muscle weakness    PHYSICAL EXAM: Blood pressure 110/68, pulse 69, temperature (!) 97.5 F (36.4 C), temperature source Oral, height 5\' 6"  (1.676 m), weight 235 lb (106.6 kg), SpO2 98 %. Body mass index  is 37.93 kg/m. Physical Exam  Constitutional: She is oriented to person, place, and time. She appears well-developed and well-nourished.  Cardiovascular: Normal rate.  Pulmonary/Chest: Effort normal.  Musculoskeletal: Normal range of motion.  Neurological: She is oriented to person, place, and time.  Skin: Skin is warm and dry.  Psychiatric: She has a normal mood and affect. Her behavior is normal.  Vitals reviewed.   RECENT LABS AND TESTS: BMET    Component Value Date/Time   NA 140 02/22/2018 0936   K 4.1 02/22/2018 0936   CL 101 02/22/2018 0936   CO2 24 02/22/2018 0936   GLUCOSE 105 (H) 02/22/2018 0936   GLUCOSE 123 (H) 01/13/2018 0915   BUN 14 02/22/2018 0936   CREATININE 1.05 (H) 02/22/2018 0936   CALCIUM 9.0 02/22/2018 0936   GFRNONAA 54 (L) 02/22/2018 0936   GFRAA 62 02/22/2018 0936   Lab Results  Component Value Date   HGBA1C 6.4 01/17/2018   HGBA1C 6.1 09/23/2015   Lab Results  Component Value Date   INSULIN 14.0 02/22/2018   CBC    Component Value Date/Time   WBC 9.7 02/22/2018 0936   WBC 8.0 01/13/2018 0915   RBC 4.38 02/22/2018 0936   RBC 4.62 01/13/2018 0915   HGB 13.3 02/22/2018 0936   HCT 40.2 02/22/2018 0936   PLT 263 02/22/2018 0936   MCV 92 02/22/2018 0936   MCH 30.4 02/22/2018 0936   MCH 30.9 01/30/2016 0405   MCHC 33.1 02/22/2018 0936   MCHC 33.8 01/13/2018 0915   RDW 14.1 02/22/2018 0936   LYMPHSABS 1.7 02/22/2018 0936   MONOABS 0.6 01/13/2018 0915   EOSABS 0.2 02/22/2018 0936   BASOSABS 0.0 02/22/2018 0936   Iron/TIBC/Ferritin/ %Sat No results found for: IRON, TIBC, FERRITIN, IRONPCTSAT Lipid Panel     Component Value Date/Time   CHOL 210 (H) 02/22/2018 0936   TRIG 197 (H) 02/22/2018 0936   HDL 43 02/22/2018 0936   CHOLHDL 4.9 (H) 02/22/2018 0936   CHOLHDL 7 01/13/2018 0915   VLDL 53.0 (H) 01/13/2018 0915   LDLCALC 128 (H) 02/22/2018 0936   LDLDIRECT 197.0 01/13/2018 0915   Hepatic Function Panel     Component Value  Date/Time   PROT 6.3 02/22/2018 0936   ALBUMIN 4.0 02/22/2018 0936   AST 9 02/22/2018 0936   ALT 10 02/22/2018 0936   ALKPHOS 81 02/22/2018 0936   BILITOT 0.5 02/22/2018 0936      Component Value Date/Time   TSH 2.350 02/22/2018 0936   TSH 1.97 01/13/2018 0915   TSH 2.51 02/04/2016   TSH 0.215 (  L) 11/11/2015 1828   Results for MAGUIRE, KILLMER "NANCI LAKATOS" (MRN [id number]) as of 04/11/2018 15:36  Ref. Range 02/22/2018 09:36  Vitamin D, 25-Hydroxy Latest Ref Range: 30.0 - 100.0 ng/mL 12.4 (L)   ASSESSMENT AND PLAN: Vitamin D deficiency  Class 2 severe obesity with serious comorbidity and body mass index (BMI) of 38.0 to 38.9 in adult, unspecified obesity type ([place])  PLAN:  Vitamin D Deficiency Maria Marquez was informed that low vitamin D levels contributes to fatigue and are associated with obesity, breast, and colon cancer. She agrees to continue to take prescription Vit D @50 ,000 IU every week. We will recheck labs in 6 weeks and will follow up for routine testing of vitamin D, at least 2-3 times per year. She was informed of the risk of over-replacement of vitamin D and agrees to not increase her dose unless she discusses this with [name] first.  We spent > than 50% of the 15 minute visit on the counseling as documented in the note.  Obesity Maria Marquez is currently in the action stage of change. As such, her goal is to continue with weight loss efforts She has agreed to keep a food journal with 1200 calories and 75+ grams of protein daily Maria Marquez has been instructed to work up to a goal of 150 minutes of combined cardio and strengthening exercise per week for weight loss and overall health benefits. We discussed the following Behavioral Modification Strategies today: increasing lean protein intake and keep a strict food journal  Maria Marquez has agreed to follow up with our clinic in 2 to 3 weeks. She was informed of the importance of frequent follow up visits to maximize her success with intensive  lifestyle modifications for her multiple health conditions.   OBESITY BEHAVIORAL INTERVENTION VISIT  Today's visit was # 4   Starting weight: 247 lbs Starting date: 02/22/18 Today's weight : 235 lbs Today's date: 04/11/2018 Total lbs lost to date: 12 At least 15 minutes were spent on discussing the following behavioral intervention visit.   ASK: We discussed the diagnosis of obesity with Maria Marquez today and Maria Marquez agreed to give [name] permission to discuss obesity behavioral modification therapy today.  ASSESS: [place] has the diagnosis of obesity and her BMI today is 37.95 Maria Marquez is in the action stage of change   ADVISE: Maria Marquez was educated on the multiple health risks of obesity as well as the benefit of weight loss to improve her health. She was advised of the need for long term treatment and the importance of lifestyle modifications to improve her current health and to decrease her risk of future health problems.  AGREE: Multiple dietary modification options and treatment options were discussed and  Maria Marquez agreed to follow the recommendations documented in the above note.  ARRANGE: Maria Marquez was educated on the importance of frequent visits to treat obesity as outlined per CMS and USPSTF guidelines and agreed to schedule her next follow up appointment today.  I, Doreene Nest, am acting as transcriptionist for [name], MD  I have reviewed the above documentation for accuracy and completeness, and I agree with the above. -[name], MD

## 2018-04-20 ENCOUNTER — Ambulatory Visit: Payer: Medicare HMO | Admitting: Family Medicine

## 2018-04-23 ENCOUNTER — Other Ambulatory Visit (INDEPENDENT_AMBULATORY_CARE_PROVIDER_SITE_OTHER): Payer: Self-pay | Admitting: Family Medicine

## 2018-04-23 DIAGNOSIS — E559 Vitamin D deficiency, unspecified: Secondary | ICD-10-CM

## 2018-04-27 ENCOUNTER — Other Ambulatory Visit (INDEPENDENT_AMBULATORY_CARE_PROVIDER_SITE_OTHER): Payer: Self-pay | Admitting: Family Medicine

## 2018-04-27 DIAGNOSIS — E559 Vitamin D deficiency, unspecified: Secondary | ICD-10-CM

## 2018-05-02 ENCOUNTER — Ambulatory Visit (INDEPENDENT_AMBULATORY_CARE_PROVIDER_SITE_OTHER): Payer: Medicare HMO | Admitting: Family Medicine

## 2018-05-02 VITALS — BP 105/68 | HR 76 | Temp 97.8°F | Ht 66.0 in | Wt 230.0 lb

## 2018-05-02 DIAGNOSIS — E7849 Other hyperlipidemia: Secondary | ICD-10-CM | POA: Diagnosis not present

## 2018-05-02 DIAGNOSIS — Z6837 Body mass index (BMI) 37.0-37.9, adult: Secondary | ICD-10-CM

## 2018-05-04 NOTE — Progress Notes (Signed)
Office: [phone]  /  Fax: [phone]   HPI:   Chief Complaint: OBESITY Maria Marquez is here to discuss her progress with her obesity treatment plan. She is on the Category 2 plan and is following her eating plan approximately 98 % of the time. She states she is walking more daily. [name] continues to do well with weight loss on her Category 2 plan. Her hunger is controlled but she is getting bored and would like to look at other options.  Her weight is 230 lb (104.3 kg) today and has had a weight loss of 5 pounds over a period of 3 weeks since her last visit. She has lost 17 lbs since starting treatment with [name].  Hyperlipidemia [name] has hyperlipidemia and has been working on improving her cholesterol levels with intensive lifestyle modification including a low saturated fat diet, exercise and weight loss. She is on Zetia and denies any chest pain, claudication or myalgias.  ALLERGIES: Allergies  Allergen Reactions  . Rosuvastatin Other (See Comments)    Muscle Pain in Thighs  . Shellfish Allergy Hives  . Sulfa Antibiotics Nausea Only  . Latex Rash  . Lisinopril Cough    MEDICATIONS: Current Outpatient Medications on File Prior to Visit  Medication Sig Dispense Refill  . aspirin 81 MG chewable tablet Chew by mouth daily.    [name] EPINEPHrine (EPIPEN 2-PAK) 0.3 mg/0.3 mL IJ SOAJ injection as needed.    . Estradiol-Norethindrone Acet 0.5-0.1 MG tablet Take 1 tablet by mouth daily. 30 tablet 5  . ezetimibe (ZETIA) 10 MG tablet Take 1 tablet (10 mg total) by mouth daily. 90 tablet 3  . levothyroxine (SYNTHROID, LEVOTHROID) 137 MCG tablet Take 1 tablet (137 mcg total) by mouth at bedtime. 90 tablet 3  . losartan-hydrochlorothiazide (HYZAAR) 50-12.5 MG tablet Take 1 tablet by mouth daily. 90 tablet 3  . omeprazole (PRILOSEC) 20 MG capsule Take 1 capsule (20 mg total) by mouth daily. 90 capsule 3  . Vitamin D, Ergocalciferol, (DRISDOL) 50000 units CAPS capsule TAKE 1 CAPSULE (50,000 UNITS  TOTAL) BY MOUTH EVERY 7 (SEVEN) DAYS. 4 capsule 0   No current facility-administered medications on file prior to visit.     PAST MEDICAL HISTORY: Past Medical History:  Diagnosis Date  . Acute blood loss anemia   . Arthritis   . Back pain   . Bilious vomiting with nausea   . Constipation   . GERD (gastroesophageal reflux disease)   . HLD (hyperlipidemia)   . Hypertension   . Hypothyroid   . Joint pain   . Leg edema   . Leukocytosis   . Multiple food allergies   . Osteoarthritis of hip    Right  . Postmenopausal hormone replacement therapy 06/17/2015  . Prediabetes   . Sessile colonic polyp 10/04/2017   Colonoscopy 12/2015, Dr. Fuller Plan, repeat q 5 years.    PAST SURGICAL HISTORY: Past Surgical History:  Procedure Laterality Date  . EYE SURGERY  1987  . HERNIA REPAIR    . THYROID LOBECTOMY     right side  . 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 Smoking status: Current Every Day Smoker    Packs/day: 0.50    Types: Cigarettes  . Smokeless tobacco: Current User  Substance Use Topics  . Alcohol use: Yes  Comment: rarely  . Drug use: No    FAMILY HISTORY: Family History  Problem Relation Age of Onset  . Arthritis Mother   . Breast cancer Mother   . Schizophrenia Mother   . Diabetes Father   . Heart disease Father   . Kidney disease Father   . Stroke Sister   . Lung cancer Brother   . Heart disease Brother   . Lung cancer Brother   . Heart disease Brother   . Vision loss Brother        Glaucoma  . Alcohol abuse Brother     ROS: Review of Systems  Constitutional: Positive for weight loss.  Cardiovascular: Negative for chest pain and claudication.  Musculoskeletal: Negative for myalgias.    PHYSICAL EXAM: Blood pressure 105/68, pulse 76,  temperature 97.8 F (36.6 C), temperature source Oral, height 5\' 6"  (1.676 m), weight 230 lb (104.3 kg), SpO2 97 %. Body mass index is 37.12 kg/m. Physical Exam  Constitutional: She is oriented to person, place, and time. She appears well-developed and well-nourished.  Cardiovascular: Normal rate.  Pulmonary/Chest: Effort normal.  Musculoskeletal: Normal range of motion.  Neurological: She is oriented to person, place, and time.  Skin: Skin is warm and dry.  Psychiatric: She has a normal mood and affect. Her behavior is normal.  Vitals reviewed.   RECENT LABS AND TESTS: BMET    Component Value Date/Time   NA 140 02/22/2018 0936   K 4.1 02/22/2018 0936   CL 101 02/22/2018 0936   CO2 24 02/22/2018 0936   GLUCOSE 105 (H) 02/22/2018 0936   GLUCOSE 123 (H) 01/13/2018 0915   BUN 14 02/22/2018 0936   CREATININE 1.05 (H) 02/22/2018 0936   CALCIUM 9.0 02/22/2018 0936   GFRNONAA 54 (L) 02/22/2018 0936   GFRAA 62 02/22/2018 0936   Lab Results  Component Value Date   HGBA1C 6.4 01/17/2018   HGBA1C 6.1 09/23/2015   Lab Results  Component Value Date   INSULIN 14.0 02/22/2018   CBC    Component Value Date/Time   WBC 9.7 02/22/2018 0936   WBC 8.0 01/13/2018 0915   RBC 4.38 02/22/2018 0936   RBC 4.62 01/13/2018 0915   HGB 13.3 02/22/2018 0936   HCT 40.2 02/22/2018 0936   PLT 263 02/22/2018 0936   MCV 92 02/22/2018 0936   MCH 30.4 02/22/2018 0936   MCH 30.9 01/30/2016 0405   MCHC 33.1 02/22/2018 0936   MCHC 33.8 01/13/2018 0915   RDW 14.1 02/22/2018 0936   LYMPHSABS 1.7 02/22/2018 0936   MONOABS 0.6 01/13/2018 0915   EOSABS 0.2 02/22/2018 0936   BASOSABS 0.0 02/22/2018 0936   Iron/TIBC/Ferritin/ %Sat No results found for: IRON, TIBC, FERRITIN, IRONPCTSAT Lipid Panel     Component Value Date/Time   CHOL 210 (H) 02/22/2018 0936   TRIG 197 (H) 02/22/2018 0936   HDL 43 02/22/2018 0936   CHOLHDL 4.9 (H) 02/22/2018 0936   CHOLHDL 7 01/13/2018 0915   VLDL 53.0 (H)  01/13/2018 0915   LDLCALC 128 (H) 02/22/2018 0936   LDLDIRECT 197.0 01/13/2018 0915   Hepatic Function Panel     Component Value Date/Time   PROT 6.3 02/22/2018 0936   ALBUMIN 4.0 02/22/2018 0936   AST 9 02/22/2018 0936   ALT 10 02/22/2018 0936   ALKPHOS 81 02/22/2018 0936   BILITOT 0.5 02/22/2018 0936      Component Value Date/Time   TSH 2.350 02/22/2018 0936   TSH 1.97 01/13/2018 0915   TSH  2.51 02/04/2016   TSH 0.215 (L) 11/11/2015 1828    ASSESSMENT AND PLAN: Other hyperlipidemia  Class 2 severe obesity with serious comorbidity and body mass index (BMI) of 37.0 to 37.9 in adult, unspecified obesity type ([place])  PLAN:  Hyperlipidemia Christe was informed of the American Heart Association Guidelines emphasizing intensive lifestyle modifications as the first line treatment for hyperlipidemia. We discussed many lifestyle modifications today in depth, and Frankie will continue to work on decreasing saturated fats such as fatty red meat, butter and many fried foods. She will also increase vegetables and lean protein in her diet and continue to work on diet, exercise, and weight loss efforts. We will check labs in 1 month. Rula agrees to follow up with our clinic in 3 weeks.  I spent > than 50% of the 15 minute visit on counseling as documented in the note.  Obesity Daniele is currently in the action stage of change. As such, her goal is to continue with weight loss efforts She has agreed to keep a food journal with 1200 calories and 75+ grams of protein daily Azriel has been instructed to work up to a goal of 150 minutes of combined cardio and strengthening exercise per week for weight loss and overall health benefits. We discussed the following Behavioral Modification Strategies today: increasing lean protein intake, decreasing simple carbohydrates  and work on meal planning and easy cooking plans   Markee has agreed to follow up with our clinic in 3 weeks. She was informed of the  importance of frequent follow up visits to maximize her success with intensive lifestyle modifications for her multiple health conditions.   OBESITY BEHAVIORAL INTERVENTION VISIT  Today's visit was # 5   Starting weight: 247 lbs Starting date: 02/22/18 Today's weight : 230 lbs  Today's date: 05/02/2018 Total lbs lost to date: 17    ASK: We discussed the diagnosis of obesity with [name] today and [name] agreed to give [name] permission to discuss obesity behavioral modification therapy today.  ASSESS: Dorcas has the diagnosis of obesity and her BMI today is 37.14 Simren is in the action stage of change   ADVISE: Chenell was educated on the multiple health risks of obesity as well as the benefit of weight loss to improve her health. She was advised of the need for long term treatment and the importance of lifestyle modifications to improve her current health and to decrease her risk of future health problems.  AGREE: Multiple dietary modification options and treatment options were discussed and  Laiah agreed to follow the recommendations documented in the above note.  ARRANGE: Lanetta was educated on the importance of frequent visits to treat obesity as outlined per CMS and USPSTF guidelines and agreed to schedule her next follow up appointment today.  I, Trixie Dredge, am acting as transcriptionist for [name], MD  I have reviewed the above documentation for accuracy and completeness, and I agree with the above. -[name], MD

## 2018-05-14 ENCOUNTER — Other Ambulatory Visit (INDEPENDENT_AMBULATORY_CARE_PROVIDER_SITE_OTHER): Payer: Self-pay | Admitting: Family Medicine

## 2018-05-14 DIAGNOSIS — E559 Vitamin D deficiency, unspecified: Secondary | ICD-10-CM

## 2018-05-19 ENCOUNTER — Ambulatory Visit (INDEPENDENT_AMBULATORY_CARE_PROVIDER_SITE_OTHER): Payer: Medicare HMO | Admitting: Emergency Medicine

## 2018-05-19 DIAGNOSIS — Z23 Encounter for immunization: Secondary | ICD-10-CM | POA: Diagnosis not present

## 2018-05-19 NOTE — Progress Notes (Signed)
After obtaining consent, and per orders of Dr. Jonni Sanger, Flu Vaccine given by Sigurd Sos. Patient instructed to remain in clinic for 20 minutes afterwards, and to report any adverse reaction to me immediately.

## 2018-05-23 ENCOUNTER — Ambulatory Visit (INDEPENDENT_AMBULATORY_CARE_PROVIDER_SITE_OTHER): Payer: Medicare HMO | Admitting: Family Medicine

## 2018-05-23 VITALS — BP 101/66 | HR 71 | Temp 97.8°F | Ht 66.0 in | Wt 231.0 lb

## 2018-05-23 DIAGNOSIS — Z6837 Body mass index (BMI) 37.0-37.9, adult: Secondary | ICD-10-CM

## 2018-05-23 DIAGNOSIS — E559 Vitamin D deficiency, unspecified: Secondary | ICD-10-CM

## 2018-05-23 DIAGNOSIS — F3289 Other specified depressive episodes: Secondary | ICD-10-CM | POA: Diagnosis not present

## 2018-05-23 MED ORDER — VITAMIN D (ERGOCALCIFEROL) 1.25 MG (50000 UNIT) PO CAPS: 50000.0000 [IU] | ORAL_CAPSULE | ORAL | 0 refills | Status: DC

## 2018-05-23 MED ORDER — BUPROPION HCL ER (SR) 150 MG PO TB12: 150.0000 mg | ORAL_TABLET | Freq: Every day | ORAL | 0 refills | Status: DC

## 2018-05-24 NOTE — Progress Notes (Signed)
Office: [phone]  /  Fax: [phone]   HPI:   Chief Complaint: OBESITY Maria Marquez is here to discuss her progress with her obesity treatment plan. She is on the keep a food journal with 1200 calories and 75+ grams of protein daily and is following her eating plan approximately 75-85 % of the time. She states she is walking for 20-25 minutes 3 times per week. Dakari was getting bored with her Category 2 plan and was changed to journaling on her last visit, and she goes back in forth. She notes cravings with increased stress at home.  Her weight is 231 lb (104.8 kg) today and has gained 1 pound since her last visit. She has lost 16 lbs since starting treatment with [name].  Vitamin D Deficiency Juliann has a diagnosis of vitamin D deficiency. She is stable on prescription Vit D and denies nausea, vomiting or muscle weakness.  Depression with emotional eating behaviors Orena notes increased stress and increased emotional eating. She has a history of depression. Alyse struggles with emotional eating and using food for comfort to the extent that it is negatively impacting her health. She often snacks when she is not hungry. Shamekia sometimes feels she is out of control and then feels guilty that she made poor food choices. She has been working on behavior modification techniques to help reduce her emotional eating and has been somewhat successful. She shows no sign of suicidal or homicidal ideations.  Depression screen [facility] 2/9 02/22/2018 12/12/2017 10/04/2017  Decreased Interest 3 3 0  Down, Depressed, Hopeless 3 2 0  PHQ - 2 Score 6 5 0  Altered sleeping 2 3 -  Tired, decreased energy 2 2 -  Change in appetite 2 2 -  Feeling bad or failure about yourself  1 0 -  Trouble concentrating 0 1 -  Moving slowly or fidgety/restless 0 0 -  Suicidal thoughts 0 0 -  PHQ-9 Score 13 13 -  Difficult doing work/chores Somewhat difficult Somewhat difficult -   ALLERGIES: Allergies  Allergen Reactions  .  Rosuvastatin Other (See Comments)    Muscle Pain in Thighs  . Shellfish Allergy Hives  . Sulfa Antibiotics Nausea Only  . Latex Rash  . Lisinopril Cough    MEDICATIONS: Current Outpatient Medications on File Prior to Visit  Medication Sig Dispense Refill  . aspirin 81 MG chewable tablet Chew by mouth daily.    [name] EPINEPHrine (EPIPEN 2-PAK) 0.3 mg/0.3 mL IJ SOAJ injection as needed.    . Estradiol-Norethindrone Acet 0.5-0.1 MG tablet Take 1 tablet by mouth daily. 30 tablet 5  . ezetimibe (ZETIA) 10 MG tablet Take 1 tablet (10 mg total) by mouth daily. 90 tablet 3  . levothyroxine (SYNTHROID, LEVOTHROID) 137 MCG tablet Take 1 tablet (137 mcg total) by mouth at bedtime. 90 tablet 3  . losartan-hydrochlorothiazide (HYZAAR) 50-12.5 MG tablet Take 1 tablet by mouth daily. 90 tablet 3  . omeprazole (PRILOSEC) 20 MG capsule Take 1 capsule (20 mg total) by mouth daily. 90 capsule 3   No current facility-administered medications on file prior to visit.     PAST MEDICAL HISTORY: Past Medical History:  Diagnosis Date  . Acute blood loss anemia   . Arthritis   . Back pain   . Bilious vomiting with nausea   . Constipation   . GERD (gastroesophageal reflux disease)   . HLD (hyperlipidemia)   . Hypertension   . Hypothyroid   . Joint pain   . Leg edema   .  Leukocytosis   . Multiple food allergies   . Osteoarthritis of hip    Right  . Postmenopausal hormone replacement therapy 06/17/2015  . Prediabetes   . Sessile colonic polyp 10/04/2017   Colonoscopy 12/2015, Dr. Fuller Plan, repeat q 5 years.    PAST SURGICAL HISTORY: Past Surgical History:  Procedure Laterality Date  . EYE SURGERY  1987  . HERNIA REPAIR    . THYROID LOBECTOMY     right side  . TOTAL HIP ARTHROPLASTY Right 01/28/2016   Procedure: RIGHT TOTAL HIP ARTHROPLASTY ANTERIOR APPROACH;  Surgeon: [name], MD;  Location: [facility];  Service: Orthopedics;  Laterality: Right;    SOCIAL HISTORY: Social History   Tobacco Use   . Smoking status: Current Every Day Smoker    Packs/day: 0.50    Types: Cigarettes  . Smokeless tobacco: Current User  Substance Use Topics  . Alcohol use: Yes    Comment: rarely  . Drug use: No    FAMILY HISTORY: Family History  Problem Relation Age of Onset  . Arthritis Mother   . Breast cancer Mother   . Schizophrenia Mother   . Diabetes Father   . Heart disease Father   . Kidney disease Father   . Stroke Sister   . Lung cancer Brother   . Heart disease Brother   . Lung cancer Brother   . Heart disease Brother   . Vision loss Brother        Glaucoma  . Alcohol abuse Brother     ROS: Review of Systems  Constitutional: Negative for weight loss.  Gastrointestinal: Negative for nausea and vomiting.  Musculoskeletal:       Negative muscle weakness  Psychiatric/Behavioral: Positive for depression. Negative for suicidal ideas.    PHYSICAL EXAM: Blood pressure 101/66, pulse 71, temperature 97.8 F (36.6 C), temperature source Oral, height 5\' 6"  (1.676 m), weight 231 lb (104.8 kg), SpO2 98 %. Body mass index is 37.28 kg/m. Physical Exam  Constitutional: She is oriented to person, place, and time. She appears well-developed and well-nourished.  Cardiovascular: Normal rate.  Pulmonary/Chest: Effort normal.  Musculoskeletal: Normal range of motion.  Neurological: She is oriented to person, place, and time.  Skin: Skin is warm and dry.  Psychiatric: She has a normal mood and affect. Her behavior is normal.  Vitals reviewed.   RECENT LABS AND TESTS: BMET    Component Value Date/Time   NA 140 02/22/2018 0936   K 4.1 02/22/2018 0936   CL 101 02/22/2018 0936   CO2 24 02/22/2018 0936   GLUCOSE 105 (H) 02/22/2018 0936   GLUCOSE 123 (H) 01/13/2018 0915   BUN 14 02/22/2018 0936   CREATININE 1.05 (H) 02/22/2018 0936   CALCIUM 9.0 02/22/2018 0936   GFRNONAA 54 (L) 02/22/2018 0936   GFRAA 62 02/22/2018 0936   Lab Results  Component Value Date   HGBA1C 6.4  01/17/2018   HGBA1C 6.1 09/23/2015   Lab Results  Component Value Date   INSULIN 14.0 02/22/2018   CBC    Component Value Date/Time   WBC 9.7 02/22/2018 0936   WBC 8.0 01/13/2018 0915   RBC 4.38 02/22/2018 0936   RBC 4.62 01/13/2018 0915   HGB 13.3 02/22/2018 0936   HCT 40.2 02/22/2018 0936   PLT 263 02/22/2018 0936   MCV 92 02/22/2018 0936   MCH 30.4 02/22/2018 0936   MCH 30.9 01/30/2016 0405   MCHC 33.1 02/22/2018 0936   MCHC 33.8 01/13/2018 0915   RDW 14.1  02/22/2018 0936   LYMPHSABS 1.7 02/22/2018 0936   MONOABS 0.6 01/13/2018 0915   EOSABS 0.2 02/22/2018 0936   BASOSABS 0.0 02/22/2018 0936   Iron/TIBC/Ferritin/ %Sat No results found for: IRON, TIBC, FERRITIN, IRONPCTSAT Lipid Panel     Component Value Date/Time   CHOL 210 (H) 02/22/2018 0936   TRIG 197 (H) 02/22/2018 0936   HDL 43 02/22/2018 0936   CHOLHDL 4.9 (H) 02/22/2018 0936   CHOLHDL 7 01/13/2018 0915   VLDL 53.0 (H) 01/13/2018 0915   LDLCALC 128 (H) 02/22/2018 0936   LDLDIRECT 197.0 01/13/2018 0915   Hepatic Function Panel     Component Value Date/Time   PROT 6.3 02/22/2018 0936   ALBUMIN 4.0 02/22/2018 0936   AST 9 02/22/2018 0936   ALT 10 02/22/2018 0936   ALKPHOS 81 02/22/2018 0936   BILITOT 0.5 02/22/2018 0936      Component Value Date/Time   TSH 2.350 02/22/2018 0936   TSH 1.97 01/13/2018 0915   TSH 2.51 02/04/2016   TSH 0.215 (L) 11/11/2015 1828  Results for [name] "KEAGHAN BOWENS" (MRN [id number]) as of 05/24/2018 08:42  Ref. Range 02/22/2018 09:36  Vitamin D, 25-Hydroxy Latest Ref Range: 30.0 - 100.0 ng/mL 12.4 (L)    ASSESSMENT AND PLAN: Vitamin D deficiency - Plan: Vitamin D, Ergocalciferol, (DRISDOL) 50000 units CAPS capsule  Other depression - with emotional eating - Plan: buPROPion (WELLBUTRIN SR) 150 MG 12 hr tablet  Class 2 severe obesity with serious comorbidity and body mass index (BMI) of 37.0 to 37.9 in adult, unspecified obesity type ([place])  PLAN:  Vitamin D  Deficiency Tziporah was informed that low vitamin D levels contributes to fatigue and are associated with obesity, breast, and colon cancer. Rielly agrees to continue taking prescription Vit D @50 ,000 IU every week #4 and we will refill for 1 month. She will follow up for routine testing of vitamin D, at least 2-3 times per year. She was informed of the risk of over-replacement of vitamin D and agrees to not increase her dose unless she discusses this with [name] first. Demica agrees to follow up with our clinic in 2 to 3 weeks.  Depression with Emotional Eating Behaviors We discussed behavior modification techniques today to help Cadi deal with her emotional eating and depression. Elina agrees to start Wellbutrin SR 150 mg q AM #30 with no refills. Randalyn agrees to follow up with our clinic in 2 to 3 weeks.  Obesity Eirene is currently in the action stage of change. As such, her goal is to continue with weight loss efforts She has agreed to keep a food journal with 1200 calories and 75+ grams of protein daily or follow the Category 2 plan Kaycie has been instructed to work up to a goal of 150 minutes of combined cardio and strengthening exercise per week for weight loss and overall health benefits. We discussed the following Behavioral Modification Strategies today: increasing lean protein intake and decreasing simple carbohydrates    Rexann has agreed to follow up with our clinic in 2 to 3 weeks. She was informed of the importance of frequent follow up visits to maximize her success with intensive lifestyle modifications for her multiple health conditions.   OBESITY BEHAVIORAL INTERVENTION VISIT  Today's visit was # 6   Starting weight: 247 lbs Starting date: 02/22/18 Today's weight : 231 lbs  Today's date: 05/23/2018 Total lbs lost to date: 16 At least 15 minutes were spent on discussing the following behavioral intervention visit.  ASK: We discussed the diagnosis of obesity with [name]  today and [name] agreed to give [name] permission to discuss obesity behavioral modification therapy today.  ASSESS: Kyndra has the diagnosis of obesity and her BMI today is 52.3 Taygen is in the action stage of change   ADVISE: Neema was educated on the multiple health risks of obesity as well as the benefit of weight loss to improve her health. She was advised of the need for long term treatment and the importance of lifestyle modifications to improve her current health and to decrease her risk of future health problems.  AGREE: Multiple dietary modification options and treatment options were discussed and  Lorane agreed to follow the recommendations documented in the above note.  ARRANGE: Gennie was educated on the importance of frequent visits to treat obesity as outlined per CMS and USPSTF guidelines and agreed to schedule her next follow up appointment today.  I, Trixie Dredge, am acting as transcriptionist for [name], MD  I have reviewed the above documentation for accuracy and completeness, and I agree with the above. -[name], MD

## 2018-06-10 ENCOUNTER — Other Ambulatory Visit (INDEPENDENT_AMBULATORY_CARE_PROVIDER_SITE_OTHER): Payer: Self-pay | Admitting: Family Medicine

## 2018-06-10 DIAGNOSIS — E559 Vitamin D deficiency, unspecified: Secondary | ICD-10-CM

## 2018-06-12 ENCOUNTER — Other Ambulatory Visit (INDEPENDENT_AMBULATORY_CARE_PROVIDER_SITE_OTHER): Payer: Self-pay | Admitting: Family Medicine

## 2018-06-12 DIAGNOSIS — F3289 Other specified depressive episodes: Secondary | ICD-10-CM

## 2018-06-13 ENCOUNTER — Ambulatory Visit (INDEPENDENT_AMBULATORY_CARE_PROVIDER_SITE_OTHER): Payer: Medicare HMO | Admitting: Family Medicine

## 2018-06-13 VITALS — BP 123/69 | HR 70 | Temp 97.7°F | Ht 66.0 in | Wt 225.0 lb

## 2018-06-13 DIAGNOSIS — K5909 Other constipation: Secondary | ICD-10-CM

## 2018-06-13 DIAGNOSIS — E7849 Other hyperlipidemia: Secondary | ICD-10-CM

## 2018-06-13 DIAGNOSIS — E038 Other specified hypothyroidism: Secondary | ICD-10-CM

## 2018-06-13 DIAGNOSIS — F3289 Other specified depressive episodes: Secondary | ICD-10-CM

## 2018-06-13 DIAGNOSIS — E559 Vitamin D deficiency, unspecified: Secondary | ICD-10-CM | POA: Diagnosis not present

## 2018-06-13 DIAGNOSIS — Z6836 Body mass index (BMI) 36.0-36.9, adult: Secondary | ICD-10-CM | POA: Diagnosis not present

## 2018-06-13 MED ORDER — POLYETHYLENE GLYCOL 3350 17 GM/SCOOP PO POWD: 17.0000 g | Freq: Two times a day (BID) | ORAL | 0 refills | Status: DC | PRN

## 2018-06-13 MED ORDER — VITAMIN D (ERGOCALCIFEROL) 1.25 MG (50000 UNIT) PO CAPS: 50000.0000 [IU] | ORAL_CAPSULE | ORAL | 0 refills | Status: DC

## 2018-06-13 MED ORDER — BUPROPION HCL ER (SR) 150 MG PO TB12: 150.0000 mg | ORAL_TABLET | Freq: Every day | ORAL | 0 refills | Status: DC

## 2018-06-14 LAB — COMPREHENSIVE METABOLIC PANEL
ALT: 16 IU/L (ref 0–32)
AST: 13 IU/L (ref 0–40)
Albumin/Globulin Ratio: 1.9 (ref 1.2–2.2)
Albumin: 4.4 g/dL (ref 3.5–4.8)
Alkaline Phosphatase: 85 IU/L (ref 39–117)
BUN/Creatinine Ratio: 16 (ref 12–28)
BUN: 22 mg/dL (ref 8–27)
Bilirubin Total: 0.7 mg/dL (ref 0.0–1.2)
CO2: 22 mmol/L (ref 20–29)
Calcium: 9.3 mg/dL (ref 8.7–10.3)
Chloride: 103 mmol/L (ref 96–106)
Creatinine, Ser: 1.39 mg/dL — ABNORMAL HIGH (ref 0.57–1.00)
GFR calc Af Amer: 44 mL/min/{1.73_m2} — ABNORMAL LOW (ref >59)
GFR calc non Af Amer: 38 mL/min/{1.73_m2} — ABNORMAL LOW (ref >59)
Globulin, Total: 2.3 g/dL (ref 1.5–4.5)
Glucose: 117 mg/dL — ABNORMAL HIGH (ref 65–99)
Potassium: 4 mmol/L (ref 3.5–5.2)
Sodium: 140 mmol/L (ref 134–144)
Total Protein: 6.7 g/dL (ref 6.0–8.5)

## 2018-06-14 LAB — HEMOGLOBIN A1C
Est. average glucose Bld gHb Est-mCnc: 120 mg/dL
Hgb A1c MFr Bld: 5.8 % — ABNORMAL HIGH (ref 4.8–5.6)

## 2018-06-14 LAB — LIPID PANEL WITH LDL/HDL RATIO
Cholesterol, Total: 238 mg/dL — ABNORMAL HIGH (ref 100–199)
HDL: 45 mg/dL (ref >39)
LDL Calculated: 156 mg/dL — ABNORMAL HIGH (ref 0–99)
LDl/HDL Ratio: 3.5 ratio — ABNORMAL HIGH (ref 0.0–3.2)
Triglycerides: 183 mg/dL — ABNORMAL HIGH (ref 0–149)
VLDL Cholesterol Cal: 37 mg/dL (ref 5–40)

## 2018-06-14 LAB — VITAMIN D 25 HYDROXY (VIT D DEFICIENCY, FRACTURES): Vit D, 25-Hydroxy: 34.5 ng/mL (ref 30.0–100.0)

## 2018-06-14 LAB — TSH: TSH: 2.01 u[IU]/mL (ref 0.450–4.500)

## 2018-06-14 LAB — T3: T3, Total: 106 ng/dL (ref 71–180)

## 2018-06-14 LAB — INSULIN, RANDOM: INSULIN: 15 u[IU]/mL (ref 2.6–24.9)

## 2018-06-14 LAB — T4, FREE: Free T4: 1.83 ng/dL — ABNORMAL HIGH (ref 0.82–1.77)

## 2018-06-15 ENCOUNTER — Ambulatory Visit (INDEPENDENT_AMBULATORY_CARE_PROVIDER_SITE_OTHER): Payer: Medicare HMO | Admitting: Family Medicine

## 2018-06-15 NOTE — Progress Notes (Signed)
Office: [phone]  /  Fax: [phone]   HPI:   Chief Complaint: OBESITY Maria Marquez is here to discuss her progress with her obesity treatment plan. She is on the Category 2 plan and is following her eating plan approximately 85 % of the time. She states she is walking 1/2 a mile 2 to 3 times per week. Maria Marquez has done better with weight loss. She is both journaling and following her category 2 plan. She still has some cravings, but is doing much better with being mindful of her food choices.  Her weight is 225 lb (102.1 kg) today and has had a weight loss of 6 pounds over a period of 3 weeks since her last visit. She has lost 22 lbs since starting treatment with [name].  Constipation Maria Marquez has a new diagnosis of constipation.  She states her bowel movements are less frequent and are every 3 to 4 days instead of every day. She is feeling bloated, and denies abdominal pain or rectal bleeding.  Vitamin D deficiency Maria Marquez has a diagnosis of vitamin D deficiency. She is currently taking vit D and is stable. She is due for labs today. She denies nausea, vomiting or muscle weakness.  Depression with emotional eating behaviors Maria Marquez is struggling with emotional eating and using food for comfort to the extent that it is negatively impacting her health. Her mood is stable and she is doing better with emotional and comfort eating. She shows no sign of suicidal or homicidal ideations.  Hyperlipidemia Maria Marquez has hyperlipidemia and has been trying to improve her cholesterol levels with intensive lifestyle modification including a low saturated fat diet, exercise, and weight loss. She is attempting to control her cholesterol levels with her diet. She denies any chest pain. Maria Marquez is due for labs today.  Hypothyroid Maria Marquez has a diagnosis of hypothyroidism. She is on levothyroxine 14mcg. She denies hot or cold intolerance or palpitations.  ALLERGIES: Allergies  Allergen Reactions  . Rosuvastatin Other (See  Comments)    Muscle Pain in Thighs  . Shellfish Allergy Hives  . Sulfa Antibiotics Nausea Only  . Latex Rash  . Lisinopril Cough    MEDICATIONS: Current Outpatient Medications on File Prior to Visit  Medication Sig Dispense Refill  . aspirin 81 MG chewable tablet Chew by mouth daily.    [name] EPINEPHrine (EPIPEN 2-PAK) 0.3 mg/0.3 mL IJ SOAJ injection as needed.    . Estradiol-Norethindrone Acet 0.5-0.1 MG tablet Take 1 tablet by mouth daily. 30 tablet 5  . ezetimibe (ZETIA) 10 MG tablet Take 1 tablet (10 mg total) by mouth daily. 90 tablet 3  . levothyroxine (SYNTHROID, LEVOTHROID) 137 MCG tablet Take 1 tablet (137 mcg total) by mouth at bedtime. 90 tablet 3  . losartan-hydrochlorothiazide (HYZAAR) 50-12.5 MG tablet Take 1 tablet by mouth daily. 90 tablet 3  . omeprazole (PRILOSEC) 20 MG capsule Take 1 capsule (20 mg total) by mouth daily. 90 capsule 3   No current facility-administered medications on file prior to visit.     PAST MEDICAL HISTORY: Past Medical History:  Diagnosis Date  . Acute blood loss anemia   . Arthritis   . Back pain   . Bilious vomiting with nausea   . Constipation   . GERD (gastroesophageal reflux disease)   . HLD (hyperlipidemia)   . Hypertension   . Hypothyroid   . Joint pain   . Leg edema   . Leukocytosis   . Multiple food allergies   . Osteoarthritis of hip  Right  . Postmenopausal hormone replacement therapy 06/17/2015  . Prediabetes   . Sessile colonic polyp 10/04/2017   Colonoscopy 12/2015, Dr. Fuller Plan, repeat q 5 years.    PAST SURGICAL HISTORY: Past Surgical History:  Procedure Laterality Date  . EYE SURGERY  1987  . HERNIA REPAIR    . THYROID LOBECTOMY     right side  . 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 Smoking status:  Current Every Day Smoker    Packs/day: 0.50    Types: Cigarettes  . Smokeless tobacco: Current User  Substance Use Topics  . Alcohol use: Yes    Comment: rarely  . Drug use: No    FAMILY HISTORY: Family History  Problem Relation Age of Onset  . Arthritis Mother   . Breast cancer Mother   . Schizophrenia Mother   . Diabetes Father   . Heart disease Father   . Kidney disease Father   . Stroke Sister   . Lung cancer Brother   . Heart disease Brother   . Lung cancer Brother   . Heart disease Brother   . Vision loss Brother        Glaucoma  . Alcohol abuse Brother     ROS: Review of Systems  Constitutional: Positive for weight loss.  Cardiovascular: Negative for chest pain and palpitations.  Gastrointestinal: Positive for constipation. Negative for abdominal pain, nausea and vomiting.       Negative for rectal bleeding. Positive for bloating.  Musculoskeletal:       Negative for muscle weakness.  Endo/Heme/Allergies:       Negative for cold intolerance. Negative for heat intolerance.  Psychiatric/Behavioral: Positive for depression.    PHYSICAL EXAM: Blood pressure 123/69, pulse 70, temperature 97.7 F (36.5 C), temperature source Oral, height 5\' 6"  (1.676 m), weight 225 lb (102.1 kg), SpO2 99 %. Body mass index is 36.32 kg/m. Physical Exam  Constitutional: She is oriented to person, place, and time. She appears well-developed and well-nourished.  Cardiovascular: Normal rate.  Pulmonary/Chest: Effort normal.  Musculoskeletal: Normal range of motion.  Neurological: She is oriented to person, place, and time.  Skin: Skin is warm and dry.  Psychiatric: She has a normal mood and affect. Her behavior is normal.  Vitals reviewed.   RECENT LABS AND TESTS: BMET    Component Value Date/Time   NA 140 06/13/2018 0843   K 4.0 06/13/2018 0843   CL 103 06/13/2018 0843   CO2 22 06/13/2018 0843   GLUCOSE 117 (H) 06/13/2018 0843   GLUCOSE 123 (H) 01/13/2018 0915   BUN  22 06/13/2018 0843   CREATININE 1.39 (H) 06/13/2018 0843   CALCIUM 9.3 06/13/2018 0843   GFRNONAA 38 (L) 06/13/2018 0843   GFRAA 44 (L) 06/13/2018 0843   Lab Results  Component Value Date   HGBA1C 5.8 (H) 06/13/2018   HGBA1C 6.4 01/17/2018   HGBA1C 6.1 09/23/2015   Lab Results  Component Value Date   INSULIN 15.0 06/13/2018   INSULIN 14.0 02/22/2018   CBC    Component Value Date/Time   WBC 9.7 02/22/2018 0936   WBC 8.0 01/13/2018 0915   RBC 4.38 02/22/2018 0936   RBC 4.62 01/13/2018 0915   HGB 13.3 02/22/2018 0936   HCT 40.2 02/22/2018 0936   PLT 263 02/22/2018  0936   MCV 92 02/22/2018 0936   MCH 30.4 02/22/2018 0936   MCH 30.9 01/30/2016 0405   MCHC 33.1 02/22/2018 0936   MCHC 33.8 01/13/2018 0915   RDW 14.1 02/22/2018 0936   LYMPHSABS 1.7 02/22/2018 0936   MONOABS 0.6 01/13/2018 0915   EOSABS 0.2 02/22/2018 0936   BASOSABS 0.0 02/22/2018 0936   Iron/TIBC/Ferritin/ %Sat No results found for: IRON, TIBC, FERRITIN, IRONPCTSAT Lipid Panel     Component Value Date/Time   CHOL 238 (H) 06/13/2018 0843   TRIG 183 (H) 06/13/2018 0843   HDL 45 06/13/2018 0843   CHOLHDL 4.9 (H) 02/22/2018 0936   CHOLHDL 7 01/13/2018 0915   VLDL 53.0 (H) 01/13/2018 0915   LDLCALC 156 (H) 06/13/2018 0843   LDLDIRECT 197.0 01/13/2018 0915   Hepatic Function Panel     Component Value Date/Time   PROT 6.7 06/13/2018 0843   ALBUMIN 4.4 06/13/2018 0843   AST 13 06/13/2018 0843   ALT 16 06/13/2018 0843   ALKPHOS 85 06/13/2018 0843   BILITOT 0.7 06/13/2018 0843      Component Value Date/Time   TSH 2.010 06/13/2018 0843   TSH 2.350 02/22/2018 0936   TSH 1.97 01/13/2018 0915   Results for Maria Marquez "REIKO VINJE" (MRN [id number]) as of 06/15/2018 13:08  Ref. Range 06/13/2018 08:43  Vitamin D, 25-Hydroxy Latest Ref Range: 30.0 - 100.0 ng/mL 34.5   ASSESSMENT AND PLAN: Other constipation - Plan: polyethylene glycol powder (GLYCOLAX/MIRALAX) powder  Vitamin D deficiency -  Plan: VITAMIN D 25 Hydroxy (Vit-D Deficiency, Fractures), Vitamin D, Ergocalciferol, (DRISDOL) 50000 units CAPS capsule  Other hyperlipidemia - Plan: Comprehensive metabolic panel, Hemoglobin A1c, Insulin, random, Lipid Panel With LDL/HDL Ratio  Other specified hypothyroidism - Plan: T3, T4, free, TSH  Other depression - with emotional eating  - Plan: buPROPion (WELLBUTRIN SR) 150 MG 12 hr tablet  Class 2 severe obesity with serious comorbidity and body mass index (BMI) of 36.0 to 36.9 in adult, unspecified obesity type (HCC)  PLAN:  Constipation Maria Marquez agrees to start Miralax 17 grams every 12 hours until she has a bowel movement and to increase her water intake. She will follow up in 3 weeks.  Vitamin D Deficiency Maria Marquez was informed that low vitamin D levels contributes to fatigue and are associated with obesity, breast, and colon cancer. She agrees to continue to take prescription Vit D @50 ,000 IU every week #4 with no refills and will follow up for routine testing of vitamin D, at least 2-3 times per year. She was informed of the risk of over-replacement of vitamin D and agrees to not increase her dose unless she discusses this with [name] first. Maria Marquez agrees to follow up at the agreed upon time in 3 weeks.  Depression with Emotional Eating Behaviors We discussed behavior modification techniques today to help Maria Marquez deal with her emotional eating and depression. She has agreed to take Wellbutrin SR 150 mg qd #30 with no refills and agreed to follow up as directed.  Hyperlipidemia Maria Marquez was informed of the American Heart Association Guidelines emphasizing intensive lifestyle modifications as the first line treatment for hyperlipidemia. We discussed many lifestyle modifications today in depth, and Maria Marquez will continue to work on decreasing saturated fats such as fatty red meat, butter and many fried foods. She will also increase vegetables and lean protein in her diet and continue to work on  exercise and weight loss efforts. We will check labs today. Maria Marquez agrees to continue with her diet  and follow up in 3 weeks.  Hypothyroid Maria Marquez was informed of the importance of good thyroid control to help with weight loss efforts. She was also informed that supertherapeutic thyroid levels are dangerous and will not improve weight loss results. Maria Marquez will have labs drawn today. She agreed to continue her levothyroxine and follow up at the agreed upon time.  Obesity Sheliah is currently in the action stage of change. As such, her goal is to continue with weight loss efforts. She has agreed to keep a food journal with 1200 calories and 75+ grams of protein. Maria Marquez has been instructed to work up to a goal of 150 minutes of combined cardio and strengthening exercise per week for weight loss and overall health benefits. We discussed the following Behavioral Modification Strategies today: increasing lean protein intake, decreasing simple carbohydrates, increase H2O intake, and increasing fiber rich foods.  Maria Marquez has agreed to follow up with our clinic in 3 weeks. She was informed of the importance of frequent follow up visits to maximize her success with intensive lifestyle modifications for her multiple health conditions.   OBESITY BEHAVIORAL INTERVENTION VISIT  Today's visit was # 7   Starting weight: 247 lbs Starting date: 02/22/18 Today's weight : Weight: 225 lb (102.1 kg)  Today's date: 06/13/2018 Total lbs lost to date: 22 At least 15 minutes were spent on discussing the following behavioral intervention visit.  ASK: We discussed the diagnosis of obesity with Maria Marquez today and Maria Marquez agreed to give [name] permission to discuss obesity behavioral modification therapy today.  ASSESS: Maria Marquez has the diagnosis of obesity and her BMI today is 36.33 Maria Marquez is in the action stage of change.   ADVISE: Maria Marquez was educated on the multiple health risks of obesity as well as the benefit of weight  loss to improve her health. She was advised of the need for long term treatment and the importance of lifestyle modifications to improve her current health and to decrease her risk of future health problems.  AGREE: Multiple dietary modification options and treatment options were discussed and Maria Marquez agreed to follow the recommendations documented in the above note.  ARRANGE: Maria Marquez was educated on the importance of frequent visits to treat obesity as outlined per CMS and USPSTF guidelines and agreed to schedule her next follow up appointment today.  I, Marcille Blanco, am acting as transcriptionist for Starlyn Skeans, MD  I have reviewed the above documentation for accuracy and completeness, and I agree with the above. -[name], MD

## 2018-06-20 ENCOUNTER — Encounter (INDEPENDENT_AMBULATORY_CARE_PROVIDER_SITE_OTHER): Payer: Self-pay | Admitting: Family Medicine

## 2018-07-03 ENCOUNTER — Ambulatory Visit (INDEPENDENT_AMBULATORY_CARE_PROVIDER_SITE_OTHER): Payer: Medicare HMO | Admitting: Family Medicine

## 2018-07-10 ENCOUNTER — Ambulatory Visit (INDEPENDENT_AMBULATORY_CARE_PROVIDER_SITE_OTHER): Payer: Medicare HMO | Admitting: Family Medicine

## 2018-07-10 VITALS — BP 118/72 | HR 70 | Temp 97.8°F | Ht 66.0 in | Wt 224.0 lb

## 2018-07-10 DIAGNOSIS — E559 Vitamin D deficiency, unspecified: Secondary | ICD-10-CM

## 2018-07-10 DIAGNOSIS — R7303 Prediabetes: Secondary | ICD-10-CM | POA: Diagnosis not present

## 2018-07-10 DIAGNOSIS — Z6836 Body mass index (BMI) 36.0-36.9, adult: Secondary | ICD-10-CM

## 2018-07-10 MED ORDER — VITAMIN D (ERGOCALCIFEROL) 1.25 MG (50000 UNIT) PO CAPS: 50000.0000 [IU] | ORAL_CAPSULE | ORAL | 0 refills | Status: DC

## 2018-07-12 NOTE — Progress Notes (Signed)
Office: [phone]  /  Fax: [phone]   HPI:   Chief Complaint: OBESITY Maria Marquez is here to discuss her progress with her obesity treatment plan. She is on the keep a food journal with 1200 calories and 75+ grams of protein daily and is following her eating plan approximately 80 % of the time. She states she is exercising 0 minutes 0 times per week. Maria Marquez continues to lose weight even over Halloween, but she is worried about holiday eating.  Her weight is 224 lb (101.6 kg) today and has had a weight loss of 1 pound over a period of 4 weeks since her last visit. She has lost 23 lbs since starting treatment with [name].  Vitamin D Deficiency Maria Marquez has a diagnosis of vitamin D deficiency. Level is improving on prescription Vit D. She denies nausea, vomiting or muscle weakness.  Pre-Diabetes Maria Marquez has a diagnosis of pre-diabetes based on her elevated Hgb A1c, improved from 6.4 to 5.8 and was informed this puts her at greater risk of developing diabetes. She is not taking metformin currently, doing well with diet prescription and continues to work on diet and exercise to decrease risk of diabetes. She denies hypoglycemia.  ALLERGIES: Allergies  Allergen Reactions  . Rosuvastatin Other (See Comments)    Muscle Pain in Thighs  . Shellfish Allergy Hives  . Sulfa Antibiotics Nausea Only  . Latex Rash  . Lisinopril Cough    MEDICATIONS: Current Outpatient Medications on File Prior to Visit  Medication Sig Dispense Refill  . aspirin 81 MG chewable tablet Chew by mouth daily.    [name] buPROPion (WELLBUTRIN SR) 150 MG 12 hr tablet Take 1 tablet (150 mg total) by mouth daily. 30 tablet 0  . EPINEPHrine (EPIPEN 2-PAK) 0.3 mg/0.3 mL IJ SOAJ injection as needed.    . Estradiol-Norethindrone Acet 0.5-0.1 MG tablet Take 1 tablet by mouth daily. 30 tablet 5  . ezetimibe (ZETIA) 10 MG tablet Take 1 tablet (10 mg total) by mouth daily. 90 tablet 3  . levothyroxine (SYNTHROID, LEVOTHROID) 137 MCG tablet  Take 1 tablet (137 mcg total) by mouth at bedtime. 90 tablet 3  . losartan-hydrochlorothiazide (HYZAAR) 50-12.5 MG tablet Take 1 tablet by mouth daily. 90 tablet 3  . omeprazole (PRILOSEC) 20 MG capsule Take 1 capsule (20 mg total) by mouth daily. 90 capsule 3  . polyethylene glycol powder (GLYCOLAX/MIRALAX) powder Take 17 g by mouth 2 (two) times daily as needed. 3350 g 0   No current facility-administered medications on file prior to visit.     PAST MEDICAL HISTORY: Past Medical History:  Diagnosis Date  . Acute blood loss anemia   . Arthritis   . Back pain   . Bilious vomiting with nausea   . Constipation   . GERD (gastroesophageal reflux disease)   . HLD (hyperlipidemia)   . Hypertension   . Hypothyroid   . Joint pain   . Leg edema   . Leukocytosis   . Multiple food allergies   . Osteoarthritis of hip    Right  . Postmenopausal hormone replacement therapy 06/17/2015  . Prediabetes   . Sessile colonic polyp 10/04/2017   Colonoscopy 12/2015, Dr. Fuller Plan, repeat q 5 years.    PAST SURGICAL HISTORY: Past Surgical History:  Procedure Laterality Date  . EYE SURGERY  1987  . HERNIA REPAIR    . THYROID LOBECTOMY     right side  . TOTAL HIP ARTHROPLASTY Right 01/28/2016   Procedure: RIGHT TOTAL HIP ARTHROPLASTY ANTERIOR  APPROACH;  Surgeon: [name], MD;  Location: [facility];  Service: Orthopedics;  Laterality: Right;    SOCIAL HISTORY: Social History   Tobacco Use  . Smoking status: Current Every Day Smoker    Packs/day: 0.50    Types: Cigarettes  . Smokeless tobacco: Current User  Substance Use Topics  . Alcohol use: Yes    Comment: rarely  . Drug use: No    FAMILY HISTORY: Family History  Problem Relation Age of Onset  . Arthritis Mother   . Breast cancer Mother   . Schizophrenia Mother   . Diabetes Father   . Heart disease Father   . Kidney disease Father   . Stroke Sister   . Lung cancer Brother   . Heart disease Brother   . Lung cancer Brother   .  Heart disease Brother   . Vision loss Brother        Glaucoma  . Alcohol abuse Brother     ROS: Review of Systems  Constitutional: Positive for weight loss.  Gastrointestinal: Negative for nausea and vomiting.  Musculoskeletal:       Negative muscle weakness  Endo/Heme/Allergies:       Negative hypoglycemia    PHYSICAL EXAM: Blood pressure 118/72, pulse 70, temperature 97.8 F (36.6 C), temperature source Oral, height 5\' 6"  (1.676 m), weight 224 lb (101.6 kg), SpO2 97 %. Body mass index is 36.15 kg/m. Physical Exam  Constitutional: She is oriented to person, place, and time. She appears well-developed and well-nourished.  Cardiovascular: Normal rate.  Pulmonary/Chest: Effort normal.  Musculoskeletal: Normal range of motion.  Neurological: She is oriented to person, place, and time.  Skin: Skin is warm and dry.  Psychiatric: She has a normal mood and affect. Her behavior is normal.  Vitals reviewed.   RECENT LABS AND TESTS: BMET    Component Value Date/Time   NA 140 06/13/2018 0843   K 4.0 06/13/2018 0843   CL 103 06/13/2018 0843   CO2 22 06/13/2018 0843   GLUCOSE 117 (H) 06/13/2018 0843   GLUCOSE 123 (H) 01/13/2018 0915   BUN 22 06/13/2018 0843   CREATININE 1.39 (H) 06/13/2018 0843   CALCIUM 9.3 06/13/2018 0843   GFRNONAA 38 (L) 06/13/2018 0843   GFRAA 44 (L) 06/13/2018 0843   Lab Results  Component Value Date   HGBA1C 5.8 (H) 06/13/2018   HGBA1C 6.4 01/17/2018   HGBA1C 6.1 09/23/2015   Lab Results  Component Value Date   INSULIN 15.0 06/13/2018   INSULIN 14.0 02/22/2018   CBC    Component Value Date/Time   WBC 9.7 02/22/2018 0936   WBC 8.0 01/13/2018 0915   RBC 4.38 02/22/2018 0936   RBC 4.62 01/13/2018 0915   HGB 13.3 02/22/2018 0936   HCT 40.2 02/22/2018 0936   PLT 263 02/22/2018 0936   MCV 92 02/22/2018 0936   MCH 30.4 02/22/2018 0936   MCH 30.9 01/30/2016 0405   MCHC 33.1 02/22/2018 0936   MCHC 33.8 01/13/2018 0915   RDW 14.1 02/22/2018  0936   LYMPHSABS 1.7 02/22/2018 0936   MONOABS 0.6 01/13/2018 0915   EOSABS 0.2 02/22/2018 0936   BASOSABS 0.0 02/22/2018 0936   Iron/TIBC/Ferritin/ %Sat No results found for: IRON, TIBC, FERRITIN, IRONPCTSAT Lipid Panel     Component Value Date/Time   CHOL 238 (H) 06/13/2018 0843   TRIG 183 (H) 06/13/2018 0843   HDL 45 06/13/2018 0843   CHOLHDL 4.9 (H) 02/22/2018 0936   CHOLHDL 7 01/13/2018 0915  VLDL 53.0 (H) 01/13/2018 0915   LDLCALC 156 (H) 06/13/2018 0843   LDLDIRECT 197.0 01/13/2018 0915   Hepatic Function Panel     Component Value Date/Time   PROT 6.7 06/13/2018 0843   ALBUMIN 4.4 06/13/2018 0843   AST 13 06/13/2018 0843   ALT 16 06/13/2018 0843   ALKPHOS 85 06/13/2018 0843   BILITOT 0.7 06/13/2018 0843      Component Value Date/Time   TSH 2.010 06/13/2018 0843   TSH 2.350 02/22/2018 0936   TSH 1.97 01/13/2018 0915  Results for [name] "HAISLEY ARENS" (MRN [id number]) as of 07/12/2018 13:43  Ref. Range 06/13/2018 08:43  Vitamin D, 25-Hydroxy Latest Ref Range: 30.0 - 100.0 ng/mL 34.5    ASSESSMENT AND PLAN: Vitamin D deficiency - Plan: Vitamin D, Ergocalciferol, (DRISDOL) 1.25 MG (50000 UT) CAPS capsule  Prediabetes  Class 2 severe obesity with serious comorbidity and body mass index (BMI) of 36.0 to 36.9 in adult, unspecified obesity type ([place])  PLAN:  Vitamin D Deficiency Derhonda was informed that low vitamin D levels contributes to fatigue and are associated with obesity, breast, and colon cancer. Issabelle agrees to continue taking prescription Vit D @50 ,000 IU every week #4 and we will refill for 1 month. She will follow up for routine testing of vitamin D, at least 2-3 times per year. She was informed of the risk of over-replacement of vitamin D and agrees to not increase her dose unless she discusses this with [name] first. Valori agrees to follow up with our clinic in 3 weeks.  Pre-Diabetes Elika will continue to work on weight loss, diet, exercise,  and decreasing simple carbohydrates in her diet to help decrease the risk of diabetes. We dicussed metformin including benefits and risks. She was informed that eating too many simple carbohydrates or too many calories at one sitting increases the likelihood of GI side effects. Camiah declined metformin for now and a prescription was not written today. Yuliza agrees to follow up with our clinic in 3 weeks as directed to monitor her progress.  Obesity Kriti is currently in the action stage of change. As such, her goal is to continue with weight loss efforts She has agreed to follow the Category 2 plan Ieshia has been instructed to work up to a goal of 150 minutes of combined cardio and strengthening exercise per week for weight loss and overall health benefits. We discussed the following Behavioral Modification Strategies today: increasing lean protein intake, decreasing simple carbohydrates, work on meal planning and easy cooking plans, keeping healthy foods in the home, and holiday eating strategies    Maria Marquez has agreed to follow up with our clinic in 3 weeks. She was informed of the importance of frequent follow up visits to maximize her success with intensive lifestyle modifications for her multiple health conditions.   OBESITY BEHAVIORAL INTERVENTION VISIT  Today's visit was # 8   Starting weight: 247 lbs Starting date: 02/22/18 Today's weight : 224 lbs Today's date: 07/10/2018 Total lbs lost to date: 23 At least 15 minutes were spent on discussing the following behavioral intervention visit.   ASK: We discussed the diagnosis of obesity with [name] today and [name] agreed to give [name] permission to discuss obesity behavioral modification therapy today.  ASSESS: Chaka has the diagnosis of obesity and her BMI today is 36.17 Myrtice is in the action stage of change   ADVISE: Kriss was educated on the multiple health risks of obesity as well as the benefit of  weight loss to improve her  health. She was advised of the need for long term treatment and the importance of lifestyle modifications to improve her current health and to decrease her risk of future health problems.  AGREE: Multiple dietary modification options and treatment options were discussed and  Destyne agreed to follow the recommendations documented in the above note.  ARRANGE: Dorella was educated on the importance of frequent visits to treat obesity as outlined per CMS and USPSTF guidelines and agreed to schedule her next follow up appointment today.  I, Trixie Dredge, am acting as transcriptionist for [name], MD  I have reviewed the above documentation for accuracy and completeness, and I agree with the above. -[name], MD

## 2018-07-23 ENCOUNTER — Other Ambulatory Visit (INDEPENDENT_AMBULATORY_CARE_PROVIDER_SITE_OTHER): Payer: Self-pay | Admitting: Family Medicine

## 2018-07-23 DIAGNOSIS — F3289 Other specified depressive episodes: Secondary | ICD-10-CM

## 2018-07-31 ENCOUNTER — Other Ambulatory Visit (INDEPENDENT_AMBULATORY_CARE_PROVIDER_SITE_OTHER): Payer: Self-pay | Admitting: Family Medicine

## 2018-07-31 DIAGNOSIS — F3289 Other specified depressive episodes: Secondary | ICD-10-CM

## 2018-08-01 ENCOUNTER — Encounter (INDEPENDENT_AMBULATORY_CARE_PROVIDER_SITE_OTHER): Payer: Self-pay | Admitting: Family Medicine

## 2018-08-01 ENCOUNTER — Ambulatory Visit (INDEPENDENT_AMBULATORY_CARE_PROVIDER_SITE_OTHER): Payer: Medicare HMO | Admitting: Family Medicine

## 2018-08-01 VITALS — BP 107/67 | HR 76 | Ht 66.0 in | Wt 218.0 lb

## 2018-08-01 DIAGNOSIS — E559 Vitamin D deficiency, unspecified: Secondary | ICD-10-CM

## 2018-08-01 DIAGNOSIS — Z6835 Body mass index (BMI) 35.0-35.9, adult: Secondary | ICD-10-CM

## 2018-08-01 DIAGNOSIS — F3289 Other specified depressive episodes: Secondary | ICD-10-CM | POA: Diagnosis not present

## 2018-08-01 MED ORDER — VITAMIN D (ERGOCALCIFEROL) 1.25 MG (50000 UNIT) PO CAPS: 50000.0000 [IU] | ORAL_CAPSULE | ORAL | 0 refills | Status: DC

## 2018-08-01 MED ORDER — BUPROPION HCL ER (SR) 150 MG PO TB12: 150.0000 mg | ORAL_TABLET | Freq: Every day | ORAL | 0 refills | Status: DC

## 2018-08-02 ENCOUNTER — Other Ambulatory Visit (INDEPENDENT_AMBULATORY_CARE_PROVIDER_SITE_OTHER): Payer: Self-pay | Admitting: Family Medicine

## 2018-08-02 DIAGNOSIS — E559 Vitamin D deficiency, unspecified: Secondary | ICD-10-CM

## 2018-08-02 NOTE — Progress Notes (Addendum)
Office: [phone]  /  Fax: [phone]   HPI:   Chief Complaint: OBESITY Maria Marquez is here to discuss her progress with her obesity treatment plan. She is on the Category 2 plan and is following her eating plan approximately 85 % of the time. She states she is doing some walking. Maria Marquez has done well with weight loss over Thanksgiving. She had a GI bug for 4 to 5 days, but is feeling better now. Some of her weight loss is due to dehydration and she is working on increasing water intake.  Her weight is 218 lb (98.9 kg) today and has had a weight loss of 6 pounds over a period of 3 weeks since her last visit. She has lost 29 lbs since starting treatment with [name].  Vitamin D deficiency Maria Marquez has a diagnosis of vitamin D deficiency. She is currently taking vit D and is stable, but not yet at goal. She denies nausea, vomiting, or muscle weakness.  Depression with emotional eating behaviors Maria Marquez is struggling with emotional eating and using food for comfort to the extent that it is negatively impacting her health. She often snacks when she is not hungry. Maria Marquez sometimes feels she is out of control and then feels guilty that she made poor food choices. She has been working on behavior modification techniques to help reduce her emotional eating and has been somewhat successful. Her mood is stable on Wellbutrin. She has been out a couple of days and notes feeling increased stress.   ALLERGIES: Allergies  Allergen Reactions  . Rosuvastatin Other (See Comments)    Muscle Pain in Thighs  . Shellfish Allergy Hives  . Sulfa Antibiotics Nausea Only  . Latex Rash  . Lisinopril Cough    MEDICATIONS: Current Outpatient Medications on File Prior to Visit  Medication Sig Dispense Refill  . aspirin 81 MG chewable tablet Chew by mouth daily.    [name] EPINEPHrine (EPIPEN 2-PAK) 0.3 mg/0.3 mL IJ SOAJ injection as needed.    . Estradiol-Norethindrone Acet 0.5-0.1 MG tablet Take 1 tablet by mouth daily. 30  tablet 5  . ezetimibe (ZETIA) 10 MG tablet Take 1 tablet (10 mg total) by mouth daily. 90 tablet 3  . levothyroxine (SYNTHROID, LEVOTHROID) 137 MCG tablet Take 1 tablet (137 mcg total) by mouth at bedtime. 90 tablet 3  . losartan-hydrochlorothiazide (HYZAAR) 50-12.5 MG tablet Take 1 tablet by mouth daily. 90 tablet 3  . omeprazole (PRILOSEC) 20 MG capsule Take 1 capsule (20 mg total) by mouth daily. 90 capsule 3  . polyethylene glycol powder (GLYCOLAX/MIRALAX) powder Take 17 g by mouth 2 (two) times daily as needed. 3350 g 0   No current facility-administered medications on file prior to visit.     PAST MEDICAL HISTORY: Past Medical History:  Diagnosis Date  . Acute blood loss anemia   . Arthritis   . Back pain   . Bilious vomiting with nausea   . Constipation   . GERD (gastroesophageal reflux disease)   . HLD (hyperlipidemia)   . Hypertension   . Hypothyroid   . Joint pain   . Leg edema   . Leukocytosis   . Multiple food allergies   . Osteoarthritis of hip    Right  . Postmenopausal hormone replacement therapy 06/17/2015  . Prediabetes   . Sessile colonic polyp 10/04/2017   Colonoscopy 12/2015, Dr. Fuller Plan, repeat q 5 years.    PAST SURGICAL HISTORY: Past Surgical History:  Procedure Laterality Date  . EYE SURGERY  1987  .  HERNIA REPAIR    . THYROID LOBECTOMY     right side  . 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 Smoking status: Current Every Day Smoker    Packs/day: 0.50    Types: Cigarettes  . Smokeless tobacco: Current User  Substance Use Topics  . Alcohol use: Yes    Comment: rarely  . Drug use: No    FAMILY HISTORY: Family History  Problem Relation Age of Onset  . Arthritis Mother   . Breast cancer Mother   . Schizophrenia Mother   . Diabetes Father    . Heart disease Father   . Kidney disease Father   . Stroke Sister   . Lung cancer Brother   . Heart disease Brother   . Lung cancer Brother   . Heart disease Brother   . Vision loss Brother        Glaucoma  . Alcohol abuse Brother     ROS: Review of Systems  Constitutional: Positive for weight loss.  Gastrointestinal: Negative for nausea and vomiting.  Musculoskeletal:       Negative for muscle weakness.  Psychiatric/Behavioral: Positive for depression.    PHYSICAL EXAM: Blood pressure 107/67, pulse 76, height 5\' 6"  (1.676 m), weight 218 lb (98.9 kg), SpO2 98 %. Body mass index is 35.19 kg/m. Physical Exam  Constitutional: She is oriented to person, place, and time. She appears well-developed and well-nourished.  Cardiovascular: Normal rate.  Pulmonary/Chest: Effort normal.  Musculoskeletal: Normal range of motion.  Neurological: She is oriented to person, place, and time.  Skin: Skin is warm and dry.  Psychiatric: She has a normal mood and affect. Her behavior is normal.    RECENT LABS AND TESTS: BMET    Component Value Date/Time   NA 140 06/13/2018 0843   K 4.0 06/13/2018 0843   CL 103 06/13/2018 0843   CO2 22 06/13/2018 0843   GLUCOSE 117 (H) 06/13/2018 0843   GLUCOSE 123 (H) 01/13/2018 0915   BUN 22 06/13/2018 0843   CREATININE 1.39 (H) 06/13/2018 0843   CALCIUM 9.3 06/13/2018 0843   GFRNONAA 38 (L) 06/13/2018 0843   GFRAA 44 (L) 06/13/2018 0843   Lab Results  Component Value Date   HGBA1C 5.8 (H) 06/13/2018   HGBA1C 6.4 01/17/2018   HGBA1C 6.1 09/23/2015   Lab Results  Component Value Date   INSULIN 15.0 06/13/2018   INSULIN 14.0 02/22/2018   CBC    Component Value Date/Time   WBC 9.7 02/22/2018 0936   WBC 8.0 01/13/2018 0915   RBC 4.38 02/22/2018 0936   RBC 4.62 01/13/2018 0915   HGB 13.3 02/22/2018 0936   HCT 40.2 02/22/2018 0936   PLT 263 02/22/2018 0936   MCV 92 02/22/2018 0936   MCH 30.4 02/22/2018 0936   MCH 30.9 01/30/2016 0405     MCHC 33.1 02/22/2018 0936   MCHC 33.8 01/13/2018 0915   RDW 14.1 02/22/2018 0936   LYMPHSABS 1.7 02/22/2018 0936   MONOABS 0.6 01/13/2018 0915   EOSABS 0.2 02/22/2018 0936   BASOSABS 0.0 02/22/2018 0936   Iron/TIBC/Ferritin/ %Sat No results found for: IRON, TIBC, FERRITIN, IRONPCTSAT Lipid Panel     Component Value Date/Time   CHOL 238 (H) 06/13/2018 0843   TRIG 183 (H) 06/13/2018 0843   HDL 45  06/13/2018 0843   CHOLHDL 4.9 (H) 02/22/2018 0936   CHOLHDL 7 01/13/2018 0915   VLDL 53.0 (H) 01/13/2018 0915   LDLCALC 156 (H) 06/13/2018 0843   LDLDIRECT 197.0 01/13/2018 0915   Hepatic Function Panel     Component Value Date/Time   PROT 6.7 06/13/2018 0843   ALBUMIN 4.4 06/13/2018 0843   AST 13 06/13/2018 0843   ALT 16 06/13/2018 0843   ALKPHOS 85 06/13/2018 0843   BILITOT 0.7 06/13/2018 0843      Component Value Date/Time   TSH 2.010 06/13/2018 0843   TSH 2.350 02/22/2018 0936   TSH 1.97 01/13/2018 0915   Results for [name] "Maria Marquez" (MRN [id number]) as of 08/02/2018 09:22  Ref. Range 06/13/2018 08:43  Vitamin D, 25-Hydroxy Latest Ref Range: 30.0 - 100.0 ng/mL 34.5   ASSESSMENT AND PLAN: Vitamin D deficiency - Plan: Vitamin D, Ergocalciferol, (DRISDOL) 1.25 MG (50000 UT) CAPS capsule  Other depression - with emotional eating  - Plan: buPROPion (WELLBUTRIN SR) 150 MG 12 hr tablet  Class 2 severe obesity with serious comorbidity and body mass index (BMI) of 35.0 to 35.9 in adult, unspecified obesity type ([place])  PLAN:  Vitamin D Deficiency Maria Marquez was informed that low vitamin D levels contributes to fatigue and are associated with obesity, breast, and colon cancer. She agrees to continue to take prescription Vit D @50 ,000 IU every week #4 with no refills and will follow up for routine testing of vitamin D, at least 2-3 times per year. She was informed of the risk of over-replacement of vitamin D and agrees to not increase her dose unless she discusses  this with [name] first. Maria Marquez agrees to follow up in 2 weeks.  Depression with Emotional Eating Behaviors We discussed behavior modification techniques today to help Maria Marquez deal with her emotional eating and depression. She has agreed to continue Wellbutrin SR 150 mg qd #30 with no refills and agreed to follow up as directed.  Obesity Maria Marquez is currently in the action stage of change. As such, her goal is to continue with weight loss efforts. She has agreed to follow the Category 2 plan. Maria Marquez has been instructed to work up to a goal of 150 minutes of combined cardio and strengthening exercise per week for weight loss and overall health benefits. We discussed the following Behavioral Modification Strategies today: increase H2O intake, no skipping meals, work on meal planning and easy cooking plans, holiday eating strategies, and celebration eating strategies   Maria Marquez has agreed to follow up with our clinic in 3 to 4 weeks. She was informed of the importance of frequent follow up visits to maximize her success with intensive lifestyle modifications for her multiple health conditions.   OBESITY BEHAVIORAL INTERVENTION VISIT  Today's visit was # 9   Starting weight: 247 lbs Starting date: 02/22/18 Today's weight : Weight: 218 lb (98.9 kg)  Today's date: 08/01/2018 Total lbs lost to date: 29 At least 15 minutes were spent on discussing the following behavioral intervention visit.  ASK: We discussed the diagnosis of obesity with [name] today and Maria Marquez agreed to give [name] permission to discuss obesity behavioral modification therapy today.  ASSESS: Maria Marquez has the diagnosis of obesity and her BMI today is 35.2 Maria Marquez is in the action stage of change   ADVISE: Maria Marquez was educated on the multiple health risks of obesity as well as the benefit of weight loss to improve her health. She was advised of the need for long term  treatment and the importance of lifestyle modifications to improve her  current health and to decrease her risk of future health problems.  AGREE: Multiple dietary modification options and treatment options were discussed and Maria Marquez agreed to follow the recommendations documented in the above note.  ARRANGE: Maria Marquez was educated on the importance of frequent visits to treat obesity as outlined per CMS and USPSTF guidelines and agreed to schedule her next follow up appointment today.  I, Marcille Blanco, am acting as transcriptionist for Starlyn Skeans, MD  I have reviewed the above documentation for accuracy and completeness, and I agree with the above. -[name], MD

## 2018-08-07 ENCOUNTER — Encounter (INDEPENDENT_AMBULATORY_CARE_PROVIDER_SITE_OTHER): Payer: Self-pay | Admitting: Family Medicine

## 2018-08-07 NOTE — Telephone Encounter (Signed)
Can you please check into this?

## 2018-08-22 ENCOUNTER — Other Ambulatory Visit: Payer: Self-pay | Admitting: Family Medicine

## 2018-08-22 DIAGNOSIS — Z1231 Encounter for screening mammogram for malignant neoplasm of breast: Secondary | ICD-10-CM

## 2018-08-31 ENCOUNTER — Encounter (INDEPENDENT_AMBULATORY_CARE_PROVIDER_SITE_OTHER): Payer: Self-pay | Admitting: Family Medicine

## 2018-08-31 ENCOUNTER — Ambulatory Visit (INDEPENDENT_AMBULATORY_CARE_PROVIDER_SITE_OTHER): Payer: Medicare HMO | Admitting: Family Medicine

## 2018-08-31 VITALS — BP 123/79 | HR 71 | Temp 97.5°F | Ht 66.0 in | Wt 221.0 lb

## 2018-08-31 DIAGNOSIS — F3289 Other specified depressive episodes: Secondary | ICD-10-CM

## 2018-08-31 DIAGNOSIS — E559 Vitamin D deficiency, unspecified: Secondary | ICD-10-CM

## 2018-08-31 DIAGNOSIS — Z6835 Body mass index (BMI) 35.0-35.9, adult: Secondary | ICD-10-CM | POA: Diagnosis not present

## 2018-08-31 MED ORDER — BUPROPION HCL ER (SR) 150 MG PO TB12: 150.0000 mg | ORAL_TABLET | Freq: Every day | ORAL | 0 refills | Status: DC

## 2018-09-01 ENCOUNTER — Other Ambulatory Visit (INDEPENDENT_AMBULATORY_CARE_PROVIDER_SITE_OTHER): Payer: Self-pay | Admitting: Family Medicine

## 2018-09-01 DIAGNOSIS — E559 Vitamin D deficiency, unspecified: Secondary | ICD-10-CM

## 2018-09-04 NOTE — Progress Notes (Addendum)
Office: [phone]  /  Fax: [phone]   HPI:   Chief Complaint: OBESITY Meranda is here to discuss her progress with her obesity treatment plan. She is on the Category 2 plan and is following her eating plan approximately 50 % of the time. She states she is walking a mile 3 times per week. Shelvy has indulged more than usual over the holidays and is disappointed she has gained weight. She states she is ready to get back on track with her eating plan and exercise.  Her weight is 221 lb (100.2 kg) today and has gained 3 lbs since her last visit. She has lost 26 lbs since starting treatment with [name].  Vitamin D deficiency Sharvi has a diagnosis of vitamin D deficiency. She is currently taking vit D and denies nausea, vomiting or muscle weakness.  Depression with emotional eating behaviors Amee is struggling with emotional eating and using food for comfort to the extent that it is negatively impacting her health. She often snacks when she is not hungry. Venie sometimes feels she is out of control and then feels guilty that she made poor food choices. She has been working on behavior modification techniques to help reduce her emotional eating and has been somewhat successful. Abbigale's mood is stable on Wellbutrin. She was tearful in the office for a short amount of time discussing the holidays after her husbands death but recovered well. She shows no sign of suicidal ideations.  Depression screen [facility] 2/9 02/22/2018 12/12/2017 10/04/2017  Decreased Interest 3 3 0  Down, Depressed, Hopeless 3 2 0  PHQ - 2 Score 6 5 0  Altered sleeping 2 3 -  Tired, decreased energy 2 2 -  Change in appetite 2 2 -  Feeling bad or failure about yourself  1 0 -  Trouble concentrating 0 1 -  Moving slowly or fidgety/restless 0 0 -  Suicidal thoughts 0 0 -  PHQ-9 Score 13 13 -  Difficult doing work/chores Somewhat difficult Somewhat difficult -    ASSESSMENT AND PLAN:  Class 2 severe obesity with serious  comorbidity and body mass index (BMI) of 35.0 to 35.9 in adult, unspecified obesity type ([facility])  Other depression - with emotional eating  - Plan: buPROPion (WELLBUTRIN SR) 150 MG 12 hr tablet  PLAN:  Depression with Emotional Eating Behaviors We discussed behavior modification techniques today to help Leanza deal with her emotional eating and depression. Jerzi has agreed to continue taking Wellbutrin SR 150 mg qd # 30 no refills and agrees to follow up with our clinic in 2 weeks.  Vitamin D Deficiency Marletta was informed that low vitamin D levels contributes to fatigue and are associated with obesity, breast, and colon cancer. She agrees to continue to take prescription Vit D @50 ,000 IU every week and will follow up for routine testing of vitamin D, at least 2-3 times per year. She was informed of the risk of over-replacement of vitamin D and agrees to not increase her dose unless she discusses this with [name] first.  Obesity Hiliana is currently in the action stage of change. As such, her goal is to continue with weight loss efforts She has agreed to follow the Category 2 plan Rashida has been instructed to work up to a goal of 150 minutes of combined cardio and strengthening exercise per week for weight loss and overall health benefits. We discussed the following Behavioral Modification Strategies today: increasing lean protein intake, decreasing simple carbohydrates  and work on meal  planning and easy cooking plans  Donnae has agreed to follow up with our clinic in 2 weeks. She was informed of the importance of frequent follow up visits to maximize her success with intensive lifestyle modifications for her multiple health conditions.  ALLERGIES: Allergies  Allergen Reactions  . Rosuvastatin Other (See Comments)    Muscle Pain in Thighs  . Shellfish Allergy Hives  . Sulfa Antibiotics Nausea Only  . Latex Rash  . Lisinopril Cough    MEDICATIONS: Current Outpatient Medications on File Prior to  Visit  Medication Sig Dispense Refill  . aspirin 81 MG chewable tablet Chew by mouth daily.    [name] EPINEPHrine (EPIPEN 2-PAK) 0.3 mg/0.3 mL IJ SOAJ injection as needed.    . Estradiol-Norethindrone Acet 0.5-0.1 MG tablet Take 1 tablet by mouth daily. 30 tablet 5  . ezetimibe (ZETIA) 10 MG tablet Take 1 tablet (10 mg total) by mouth daily. 90 tablet 3  . levothyroxine (SYNTHROID, LEVOTHROID) 137 MCG tablet Take 1 tablet (137 mcg total) by mouth at bedtime. 90 tablet 3  . losartan-hydrochlorothiazide (HYZAAR) 50-12.5 MG tablet Take 1 tablet by mouth daily. 90 tablet 3  . omeprazole (PRILOSEC) 20 MG capsule Take 1 capsule (20 mg total) by mouth daily. 90 capsule 3  . polyethylene glycol powder (GLYCOLAX/MIRALAX) powder Take 17 g by mouth 2 (two) times daily as needed. 3350 g 0  . Vitamin D, Ergocalciferol, (DRISDOL) 1.25 MG (50000 UT) CAPS capsule Take 1 capsule (50,000 Units total) by mouth every 7 (seven) days. 4 capsule 0   No current facility-administered medications on file prior to visit.     PAST MEDICAL HISTORY: Past Medical History:  Diagnosis Date  . Acute blood loss anemia   . Arthritis   . Back pain   . Bilious vomiting with nausea   . Constipation   . GERD (gastroesophageal reflux disease)   . HLD (hyperlipidemia)   . Hypertension   . Hypothyroid   . Joint pain   . Leg edema   . Leukocytosis   . Multiple food allergies   . Osteoarthritis of hip    Right  . Postmenopausal hormone replacement therapy 06/17/2015  . Prediabetes   . Sessile colonic polyp 10/04/2017   Colonoscopy 12/2015, Dr. Fuller Plan, repeat q 5 years.    PAST SURGICAL HISTORY: Past Surgical History:  Procedure Laterality Date  . EYE SURGERY  1987  . HERNIA REPAIR    . THYROID LOBECTOMY     right side  . TOTAL HIP ARTHROPLASTY Right 01/28/2016   Procedure: RIGHT TOTAL HIP ARTHROPLASTY ANTERIOR APPROACH;  Surgeon: [name], MD;  Location: [facility];  Service: Orthopedics;  Laterality: Right;    SOCIAL  HISTORY: Social History   Tobacco Use  . Smoking status: Current Every Day Smoker    Packs/day: 0.50    Types: Cigarettes  . Smokeless tobacco: Current User  Substance Use Topics  . Alcohol use: Yes    Comment: rarely  . Drug use: No    FAMILY HISTORY: Family History  Problem Relation Age of Onset  . Arthritis Mother   . Breast cancer Mother   . Schizophrenia Mother   . Diabetes Father   . Heart disease Father   . Kidney disease Father   . Stroke Sister   . Lung cancer Brother   . Heart disease Brother   . Lung cancer Brother   . Heart disease Brother   . Vision loss Brother  Glaucoma  . Alcohol abuse Brother     ROS: Review of Systems  Constitutional: Negative for weight loss.  Psychiatric/Behavioral: Negative for suicidal ideas.    PHYSICAL EXAM: Blood pressure 123/79, pulse 71, temperature (!) 97.5 F (36.4 C), temperature source Oral, height 5\' 6"  (1.676 m), weight 221 lb (100.2 kg), SpO2 99 %. Body mass index is 35.67 kg/m. Physical Exam Vitals signs reviewed.  Constitutional:      Appearance: Normal appearance. She is obese.  Cardiovascular:     Rate and Rhythm: Normal rate.     Pulses: Normal pulses.  Pulmonary:     Effort: Pulmonary effort is normal.  Musculoskeletal: Normal range of motion.  Skin:    General: Skin is warm and dry.  Neurological:     Mental Status: She is alert and oriented to person, place, and time.  Psychiatric:        Mood and Affect: Mood normal.        Behavior: Behavior normal.     RECENT LABS AND TESTS: BMET    Component Value Date/Time   NA 140 06/13/2018 0843   K 4.0 06/13/2018 0843   CL 103 06/13/2018 0843   CO2 22 06/13/2018 0843   GLUCOSE 117 (H) 06/13/2018 0843   GLUCOSE 123 (H) 01/13/2018 0915   BUN 22 06/13/2018 0843   CREATININE 1.39 (H) 06/13/2018 0843   CALCIUM 9.3 06/13/2018 0843   GFRNONAA 38 (L) 06/13/2018 0843   GFRAA 44 (L) 06/13/2018 0843   Lab Results  Component Value Date    HGBA1C 5.8 (H) 06/13/2018   HGBA1C 6.4 01/17/2018   HGBA1C 6.1 09/23/2015   Lab Results  Component Value Date   INSULIN 15.0 06/13/2018   INSULIN 14.0 02/22/2018   CBC    Component Value Date/Time   WBC 9.7 02/22/2018 0936   WBC 8.0 01/13/2018 0915   RBC 4.38 02/22/2018 0936   RBC 4.62 01/13/2018 0915   HGB 13.3 02/22/2018 0936   HCT 40.2 02/22/2018 0936   PLT 263 02/22/2018 0936   MCV 92 02/22/2018 0936   MCH 30.4 02/22/2018 0936   MCH 30.9 01/30/2016 0405   MCHC 33.1 02/22/2018 0936   MCHC 33.8 01/13/2018 0915   RDW 14.1 02/22/2018 0936   LYMPHSABS 1.7 02/22/2018 0936   MONOABS 0.6 01/13/2018 0915   EOSABS 0.2 02/22/2018 0936   BASOSABS 0.0 02/22/2018 0936   Iron/TIBC/Ferritin/ %Sat No results found for: IRON, TIBC, FERRITIN, IRONPCTSAT Lipid Panel     Component Value Date/Time   CHOL 238 (H) 06/13/2018 0843   TRIG 183 (H) 06/13/2018 0843   HDL 45 06/13/2018 0843   CHOLHDL 4.9 (H) 02/22/2018 0936   CHOLHDL 7 01/13/2018 0915   VLDL 53.0 (H) 01/13/2018 0915   LDLCALC 156 (H) 06/13/2018 0843   LDLDIRECT 197.0 01/13/2018 0915   Hepatic Function Panel     Component Value Date/Time   PROT 6.7 06/13/2018 0843   ALBUMIN 4.4 06/13/2018 0843   AST 13 06/13/2018 0843   ALT 16 06/13/2018 0843   ALKPHOS 85 06/13/2018 0843   BILITOT 0.7 06/13/2018 0843      Component Value Date/Time   TSH 2.010 06/13/2018 0843   TSH 2.350 02/22/2018 0936   TSH 1.97 01/13/2018 0915      OBESITY BEHAVIORAL INTERVENTION VISIT  Today's visit was # 10   Starting weight: 247 lbs Starting date: 02/22/2018 Today's weight : 221 lb  Today's date: 08/31/2018 Total lbs lost to date: 26 At least 6  minutes were spent on discussing the following behavioral intervention visit.  ASK: We discussed the diagnosis of obesity with [name] today and [name] agreed to give [name] permission to discuss obesity behavioral modification therapy today.  ASSESS: Yazmina has the diagnosis of obesity  and her BMI today is 35.96 Tona is in the action stage of change   ADVISE: Shanti was educated on the multiple health risks of obesity as well as the benefit of weight loss to improve her health. She was advised of the need for long term treatment and the importance of lifestyle modifications to improve her current health and to decrease her risk of future health problems.  AGREE: Multiple dietary modification options and treatment options were discussed and  Shaney agreed to follow the recommendations documented in the above note.  ARRANGE: Lanai was educated on the importance of frequent visits to treat obesity as outlined per CMS and USPSTF guidelines and agreed to schedule her next follow up appointment today.  I,Tammy Wysor, am acting as transcriptionist for Dr. Starlyn Skeans, MD  I have reviewed the above documentation for accuracy and completeness, and I agree with the above. -[name], MD

## 2018-09-05 MED ORDER — VITAMIN D (ERGOCALCIFEROL) 1.25 MG (50000 UNIT) PO CAPS: 50000.0000 [IU] | ORAL_CAPSULE | ORAL | 0 refills | Status: DC

## 2018-09-05 NOTE — Addendum Note (Signed)
Addended by: [name] D on: 09/05/2018 03:32 PM   Modules accepted: Orders, Level of Service

## 2018-09-12 ENCOUNTER — Ambulatory Visit (INDEPENDENT_AMBULATORY_CARE_PROVIDER_SITE_OTHER): Payer: Medicare HMO | Admitting: Family Medicine

## 2018-09-12 ENCOUNTER — Encounter (INDEPENDENT_AMBULATORY_CARE_PROVIDER_SITE_OTHER): Payer: Self-pay | Admitting: Family Medicine

## 2018-09-12 VITALS — BP 143/74 | HR 64 | Ht 66.0 in | Wt 218.0 lb

## 2018-09-12 DIAGNOSIS — Z6835 Body mass index (BMI) 35.0-35.9, adult: Secondary | ICD-10-CM

## 2018-09-12 DIAGNOSIS — R7303 Prediabetes: Secondary | ICD-10-CM | POA: Diagnosis not present

## 2018-09-13 NOTE — Progress Notes (Signed)
Office: [phone]  /  Fax: [phone]   HPI:   Chief Complaint: OBESITY Maria Marquez is here to discuss her progress with her obesity treatment plan. She is on the Category 2 plan and is following her eating plan approximately 80 % of the time. She states she is exercising 0 minutes 0 times per week. Maria Marquez continues to do well with weight loss and has done better following her Category 2 plan. She is going to be traveling soon and would like to discuss travel options.  Her weight is 218 lb (98.9 kg) today and has had a weight loss of 3 pounds over a period of 1 to 2 weeks since her last visit. She has lost 29 lbs since starting treatment with [name].  Pre-Diabetes Maria Marquez has a diagnosis of pre-diabetes based on her elevated Hgb A1c and was informed this puts her at greater risk of developing diabetes. She is doing well with diet and weight loss and her A1c has improved nicely. She notes polyphagia in the last 2 weeks, worse in the PM. She denies nausea or hypoglycemia.  ASSESSMENT AND PLAN:  Prediabetes  Class 2 severe obesity with serious comorbidity and body mass index (BMI) of 35.0 to 35.9 in adult, unspecified obesity type ([place])  PLAN:  Pre-Diabetes Maria Marquez will continue to work on weight loss, diet, exercise, and decreasing simple carbohydrates in her diet to help decrease the risk of diabetes. We dicussed metformin including benefits and risks. She was informed that eating too many simple carbohydrates or too many calories at one sitting increases the likelihood of GI side effects. Maria Marquez was again offered metformin to decrease polyphagia but she declines metformin for now and a prescription was not written today. We will recheck labs at next visit. Maria Marquez agrees to follow up with our clinic in 3 weeks as directed to monitor her progress.  I spent > than 50% of the 15 minute visit on counseling as documented in the note.  Obesity Maria Marquez is currently in the action stage of change. As such,  her goal is to continue with weight loss efforts She has agreed to follow the Category 2 plan Maria Marquez has been instructed to work up to a goal of 150 minutes of combined cardio and strengthening exercise per week for weight loss and overall health benefits. We discussed the following Behavioral Modification Strategies today: increasing lean protein intake, increasing vegetables, no skipping meals, and travel eating strategies    Maria Marquez has agreed to follow up with our clinic in 3 weeks. She was informed of the importance of frequent follow up visits to maximize her success with intensive lifestyle modifications for her multiple health conditions.  ALLERGIES: Allergies  Allergen Reactions  . Rosuvastatin Other (See Comments)    Muscle Pain in Thighs  . Shellfish Allergy Hives  . Sulfa Antibiotics Nausea Only  . Latex Rash  . Lisinopril Cough    MEDICATIONS: Current Outpatient Medications on File Prior to Visit  Medication Sig Dispense Refill  . aspirin 81 MG chewable tablet Chew by mouth daily.    [name] buPROPion (WELLBUTRIN SR) 150 MG 12 hr tablet Take 1 tablet (150 mg total) by mouth daily. 30 tablet 0  . EPINEPHrine (EPIPEN 2-PAK) 0.3 mg/0.3 mL IJ SOAJ injection as needed.    . Estradiol-Norethindrone Acet 0.5-0.1 MG tablet Take 1 tablet by mouth daily. 30 tablet 5  . ezetimibe (ZETIA) 10 MG tablet Take 1 tablet (10 mg total) by mouth daily. 90 tablet 3  . levothyroxine (  SYNTHROID, LEVOTHROID) 137 MCG tablet Take 1 tablet (137 mcg total) by mouth at bedtime. 90 tablet 3  . losartan-hydrochlorothiazide (HYZAAR) 50-12.5 MG tablet Take 1 tablet by mouth daily. 90 tablet 3  . omeprazole (PRILOSEC) 20 MG capsule Take 1 capsule (20 mg total) by mouth daily. 90 capsule 3  . polyethylene glycol powder (GLYCOLAX/MIRALAX) powder Take 17 g by mouth 2 (two) times daily as needed. 3350 g 0  . Vitamin D, Ergocalciferol, (DRISDOL) 1.25 MG (50000 UT) CAPS capsule Take 1 capsule (50,000 Units total) by  mouth every 7 (seven) days. 4 capsule 0   No current facility-administered medications on file prior to visit.     PAST MEDICAL HISTORY: Past Medical History:  Diagnosis Date  . Acute blood loss anemia   . Arthritis   . Back pain   . Bilious vomiting with nausea   . Constipation   . GERD (gastroesophageal reflux disease)   . HLD (hyperlipidemia)   . Hypertension   . Hypothyroid   . Joint pain   . Leg edema   . Leukocytosis   . Multiple food allergies   . Osteoarthritis of hip    Right  . Postmenopausal hormone replacement therapy 06/17/2015  . Prediabetes   . Sessile colonic polyp 10/04/2017   Colonoscopy 12/2015, Dr. Fuller Plan, repeat q 5 years.    PAST SURGICAL HISTORY: Past Surgical History:  Procedure Laterality Date  . EYE SURGERY  1987  . HERNIA REPAIR    . THYROID LOBECTOMY     right side  . 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 Smoking status: Current Every Day Smoker    Packs/day: 0.50    Types: Cigarettes  . Smokeless tobacco: Current User  Substance Use Topics  . Alcohol use: Yes    Comment: rarely  . Drug use: No    FAMILY HISTORY: Family History  Problem Relation Age of Onset  . Arthritis Mother   . Breast cancer Mother   . Schizophrenia Mother   . Diabetes Father   . Heart disease Father   . Kidney disease Father   . Stroke Sister   . Lung cancer Brother   . Heart disease Brother   . Lung cancer Brother   . Heart disease Brother   . Vision loss Brother        Glaucoma  . Alcohol abuse Brother     ROS: Review of Systems  Constitutional: Positive for weight loss.  Gastrointestinal: Negative for nausea.  Endo/Heme/Allergies:       Positive polyphagia Negative hypoglycemia    PHYSICAL EXAM: Blood pressure (!) 143/74, pulse 64,  height 5\' 6"  (1.676 m), weight 218 lb (98.9 kg), SpO2 100 %. Body mass index is 35.19 kg/m. Physical Exam Vitals signs reviewed.  Constitutional:      Appearance: Normal appearance. She is obese.  Cardiovascular:     Rate and Rhythm: Normal rate.     Pulses: Normal pulses.  Pulmonary:     Effort: Pulmonary effort is normal.     Breath sounds: Normal breath sounds.  Musculoskeletal: Normal range of motion.  Skin:    General: Skin is warm and dry.  Neurological:     Mental Status: She is alert. She is disoriented.  Psychiatric:  Mood and Affect: Mood normal.        Behavior: Behavior normal.     RECENT LABS AND TESTS: BMET    Component Value Date/Time   NA 140 06/13/2018 0843   K 4.0 06/13/2018 0843   CL 103 06/13/2018 0843   CO2 22 06/13/2018 0843   GLUCOSE 117 (H) 06/13/2018 0843   GLUCOSE 123 (H) 01/13/2018 0915   BUN 22 06/13/2018 0843   CREATININE 1.39 (H) 06/13/2018 0843   CALCIUM 9.3 06/13/2018 0843   GFRNONAA 38 (L) 06/13/2018 0843   GFRAA 44 (L) 06/13/2018 0843   Lab Results  Component Value Date   HGBA1C 5.8 (H) 06/13/2018   HGBA1C 6.4 01/17/2018   HGBA1C 6.1 09/23/2015   Lab Results  Component Value Date   INSULIN 15.0 06/13/2018   INSULIN 14.0 02/22/2018   CBC    Component Value Date/Time   WBC 9.7 02/22/2018 0936   WBC 8.0 01/13/2018 0915   RBC 4.38 02/22/2018 0936   RBC 4.62 01/13/2018 0915   HGB 13.3 02/22/2018 0936   HCT 40.2 02/22/2018 0936   PLT 263 02/22/2018 0936   MCV 92 02/22/2018 0936   MCH 30.4 02/22/2018 0936   MCH 30.9 01/30/2016 0405   MCHC 33.1 02/22/2018 0936   MCHC 33.8 01/13/2018 0915   RDW 14.1 02/22/2018 0936   LYMPHSABS 1.7 02/22/2018 0936   MONOABS 0.6 01/13/2018 0915   EOSABS 0.2 02/22/2018 0936   BASOSABS 0.0 02/22/2018 0936   Iron/TIBC/Ferritin/ %Sat No results found for: IRON, TIBC, FERRITIN, IRONPCTSAT Lipid Panel     Component Value Date/Time   CHOL 238 (H) 06/13/2018 0843   TRIG 183 (H)  06/13/2018 0843   HDL 45 06/13/2018 0843   CHOLHDL 4.9 (H) 02/22/2018 0936   CHOLHDL 7 01/13/2018 0915   VLDL 53.0 (H) 01/13/2018 0915   LDLCALC 156 (H) 06/13/2018 0843   LDLDIRECT 197.0 01/13/2018 0915   Hepatic Function Panel     Component Value Date/Time   PROT 6.7 06/13/2018 0843   ALBUMIN 4.4 06/13/2018 0843   AST 13 06/13/2018 0843   ALT 16 06/13/2018 0843   ALKPHOS 85 06/13/2018 0843   BILITOT 0.7 06/13/2018 0843      Component Value Date/Time   TSH 2.010 06/13/2018 0843   TSH 2.350 02/22/2018 0936   TSH 1.97 01/13/2018 0915      OBESITY BEHAVIORAL INTERVENTION VISIT  Today's visit was # 11   Starting weight: 247 lbs Starting date: 02/22/18 Today's weight : 218 lbs  Today's date: 09/12/2018 Total lbs lost to date: 54    ASK: We discussed the diagnosis of obesity with [name] today and [name] agreed to give [name] permission to discuss obesity behavioral modification therapy today.  ASSESS: Tulani has the diagnosis of obesity and her BMI today is 35.2 Ermal is in the action stage of change   ADVISE: Lanetta was educated on the multiple health risks of obesity as well as the benefit of weight loss to improve her health. She was advised of the need for long term treatment and the importance of lifestyle modifications to improve her current health and to decrease her risk of future health problems.  AGREE: Multiple dietary modification options and treatment options were discussed and  Sunya agreed to follow the recommendations documented in the above note.  ARRANGE: Skyleen was educated on the importance of frequent visits to treat obesity as outlined per CMS and USPSTF guidelines and agreed to schedule her next follow up  appointment today.  I, Trixie Dredge, am acting as transcriptionist for [name], MD  I have reviewed the above documentation for accuracy and completeness, and I agree with the above. -[name], MD

## 2018-09-21 ENCOUNTER — Other Ambulatory Visit: Payer: Self-pay | Admitting: Family Medicine

## 2018-09-21 NOTE — Telephone Encounter (Signed)
Please advise, medication is not listed on patients current med list in our system.

## 2018-09-22 ENCOUNTER — Other Ambulatory Visit (INDEPENDENT_AMBULATORY_CARE_PROVIDER_SITE_OTHER): Payer: Self-pay | Admitting: Family Medicine

## 2018-09-22 ENCOUNTER — Other Ambulatory Visit: Payer: Self-pay | Admitting: Family Medicine

## 2018-09-22 DIAGNOSIS — F3289 Other specified depressive episodes: Secondary | ICD-10-CM

## 2018-09-30 ENCOUNTER — Other Ambulatory Visit (INDEPENDENT_AMBULATORY_CARE_PROVIDER_SITE_OTHER): Payer: Self-pay | Admitting: Family Medicine

## 2018-09-30 DIAGNOSIS — E559 Vitamin D deficiency, unspecified: Secondary | ICD-10-CM

## 2018-10-03 ENCOUNTER — Ambulatory Visit (INDEPENDENT_AMBULATORY_CARE_PROVIDER_SITE_OTHER): Payer: Medicare HMO | Admitting: Family Medicine

## 2018-10-03 ENCOUNTER — Encounter (INDEPENDENT_AMBULATORY_CARE_PROVIDER_SITE_OTHER): Payer: Self-pay | Admitting: Family Medicine

## 2018-10-03 VITALS — BP 130/78 | HR 68 | Ht 66.0 in | Wt 220.0 lb

## 2018-10-03 DIAGNOSIS — F3289 Other specified depressive episodes: Secondary | ICD-10-CM

## 2018-10-03 DIAGNOSIS — Z6835 Body mass index (BMI) 35.0-35.9, adult: Secondary | ICD-10-CM

## 2018-10-03 DIAGNOSIS — E559 Vitamin D deficiency, unspecified: Secondary | ICD-10-CM

## 2018-10-03 MED ORDER — BUPROPION HCL ER (SR) 150 MG PO TB12: 150.0000 mg | ORAL_TABLET | Freq: Every day | ORAL | 0 refills | Status: DC

## 2018-10-03 MED ORDER — VITAMIN D (ERGOCALCIFEROL) 1.25 MG (50000 UNIT) PO CAPS: 50000.0000 [IU] | ORAL_CAPSULE | ORAL | 0 refills | Status: DC

## 2018-10-04 NOTE — Progress Notes (Signed)
Office: [phone]  /  Fax: [phone]   HPI:   Chief Complaint: OBESITY Maria Marquez is here to discuss her progress with her obesity treatment plan. She is on the Category 2 plan and is following her eating plan approximately 75 % of the time. She states she is walking for 15-20 minutes 3-4 times per week. Maria Marquez has been traveling and eating out but states she is ready to get back on track.  Her weight is 220 lb (99.8 kg) today and has gained 2 pounds since her last visit. She has lost 27 lbs since starting treatment with [name].  Vitamin D Deficiency Maria Marquez has a diagnosis of vitamin D deficiency. She is stable on prescription Vit D and she is due for labs soon. She denies nausea, vomiting or muscle weakness.  Depression with emotional eating behaviors Maria Marquez's mood is stable on Wellbutrin and she denies insomnia. Maria Marquez struggles with emotional eating and using food for comfort to the extent that it is negatively impacting her health. She often snacks when she is not hungry. Maria Marquez sometimes feels she is out of control and then feels guilty that she made poor food choices. She has been working on behavior modification techniques to help reduce her emotional eating and has been somewhat successful. She shows no sign of suicidal or homicidal ideations.  Depression screen Maria Marquez 2/9 02/22/2018 12/12/2017 10/04/2017  Decreased Interest 3 3 0  Down, Depressed, Hopeless 3 2 0  PHQ - 2 Score 6 5 0  Altered sleeping 2 3 -  Tired, decreased energy 2 2 -  Change in appetite 2 2 -  Feeling bad or failure about yourself  1 0 -  Trouble concentrating 0 1 -  Moving slowly or fidgety/restless 0 0 -  Suicidal thoughts 0 0 -  PHQ-9 Score 13 13 -  Difficult doing work/chores Somewhat difficult Somewhat difficult -    ASSESSMENT AND PLAN:  Vitamin D deficiency - Plan: Vitamin D, Ergocalciferol, (DRISDOL) 1.25 MG (50000 UT) CAPS capsule  Other depression - with emotional eating  - Plan: buPROPion (WELLBUTRIN  SR) 150 MG 12 hr tablet  Class 2 severe obesity with serious comorbidity and body mass index (BMI) of 35.0 to 35.9 in adult, unspecified obesity type ([facility])  PLAN:  Vitamin D Deficiency Maria Marquez was informed that low vitamin D levels contributes to fatigue and are associated with obesity, breast, and colon cancer. Maria Marquez agrees to continue taking prescription Vit D @50 ,000 IU every week #4 and we will refill for 1 month. She will follow up for routine testing of vitamin D, at least 2-3 times per year. She was informed of the risk of over-replacement of vitamin D and agrees to not increase her dose unless she discusses this with [name] first. We will recheck labs at next visit. Maria Marquez agrees to follow up with our clinic in 2 to 3 weeks.  Depression with Emotional Eating Behaviors We discussed behavior modification techniques today to help Maria Marquez deal with her emotional eating and depression. Maria Marquez agrees to continue taking Wellbutrin SR 150 mg qd #30 and we will refill for 1 month. Maria Marquez agrees to follow up with our clinic in 2 to 3 weeks.  Obesity Maria Marquez is currently in the action stage of change. As such, her goal is to continue with weight loss efforts She has agreed to strictly follow the Category 2 plan Maria Marquez has been instructed to work up to a goal of 150 minutes of combined cardio and strengthening exercise per week for  weight loss and overall health benefits. We discussed the following Behavioral Modification Strategies today: increasing lean protein intake, decreasing simple carbohydrates  and work on meal planning and easy cooking plans   Maria Marquez has agreed to follow up with our clinic in 2 to 3 weeks. She was informed of the importance of frequent follow up visits to maximize her success with intensive lifestyle modifications for her multiple health conditions.  ALLERGIES: Allergies  Allergen Reactions  . Rosuvastatin Other (See Comments)    Muscle Pain in Thighs  . Shellfish Allergy Hives    . Sulfa Antibiotics Nausea Only  . Latex Rash  . Lisinopril Cough    MEDICATIONS: Current Outpatient Medications on File Prior to Visit  Medication Sig Dispense Refill  . aspirin 81 MG chewable tablet Chew by mouth daily.    [name] EPINEPHrine (EPIPEN 2-PAK) 0.3 mg/0.3 mL IJ SOAJ injection as needed.    [name] estradiol-norethindrone (ACTIVELLA) 1-0.5 MG tablet Take 1 tablet by mouth daily. 90 tablet 1  . ezetimibe (ZETIA) 10 MG tablet Take 1 tablet (10 mg total) by mouth daily. 90 tablet 3  . levothyroxine (SYNTHROID, LEVOTHROID) 137 MCG tablet Take 1 tablet (137 mcg total) by mouth at bedtime. 90 tablet 3  . losartan-hydrochlorothiazide (HYZAAR) 50-12.5 MG tablet Take 1 tablet by mouth daily. 90 tablet 3  . omeprazole (PRILOSEC) 20 MG capsule Take 1 capsule (20 mg total) by mouth daily. 90 capsule 3  . polyethylene glycol powder (GLYCOLAX/MIRALAX) powder Take 17 g by mouth 2 (two) times daily as needed. 3350 g 0   No current facility-administered medications on file prior to visit.     PAST MEDICAL HISTORY: Past Medical History:  Diagnosis Date  . Acute blood loss anemia   . Arthritis   . Back pain   . Bilious vomiting with nausea   . Constipation   . GERD (gastroesophageal reflux disease)   . HLD (hyperlipidemia)   . Hypertension   . Hypothyroid   . Joint pain   . Leg edema   . Leukocytosis   . Multiple food allergies   . Osteoarthritis of hip    Right  . Postmenopausal hormone replacement therapy 06/17/2015  . Prediabetes   . Sessile colonic polyp 10/04/2017   Colonoscopy 12/2015, Dr. Fuller Plan, repeat q 5 years.    PAST SURGICAL HISTORY: Past Surgical History:  Procedure Laterality Date  . EYE SURGERY  1987  . HERNIA REPAIR    . THYROID LOBECTOMY     right side  . TOTAL HIP ARTHROPLASTY Right 01/28/2016   Procedure: RIGHT TOTAL HIP ARTHROPLASTY ANTERIOR APPROACH;  Surgeon: [name], MD;  Location: [facility];  Service: Orthopedics;  Laterality: Right;    SOCIAL  HISTORY: Social History   Tobacco Use  . Smoking status: Current Every Day Smoker    Packs/day: 0.50    Types: Cigarettes  . Smokeless tobacco: Current User  Substance Use Topics  . Alcohol use: Yes    Comment: rarely  . Drug use: No    FAMILY HISTORY: Family History  Problem Relation Age of Onset  . Arthritis Mother   . Breast cancer Mother   . Schizophrenia Mother   . Diabetes Father   . Heart disease Father   . Kidney disease Father   . Stroke Sister   . Lung cancer Brother   . Heart disease Brother   . Lung cancer Brother   . Heart disease Brother   . Vision loss Brother  Glaucoma  . Alcohol abuse Brother     ROS: Review of Systems  Constitutional: Negative for weight loss.  Gastrointestinal: Negative for nausea and vomiting.  Musculoskeletal:       Negative muscle weakness  Psychiatric/Behavioral: Positive for depression. Negative for suicidal ideas. The patient does not have insomnia.     PHYSICAL EXAM: Blood pressure 130/78, pulse 68, height 5\' 6"  (1.676 m), weight 220 lb (99.8 kg), SpO2 100 %. Body mass index is 35.51 kg/m. Physical Exam Vitals signs reviewed.  Constitutional:      Appearance: Normal appearance. She is obese.  Cardiovascular:     Rate and Rhythm: Normal rate.     Pulses: Normal pulses.  Pulmonary:     Effort: Pulmonary effort is normal.     Breath sounds: Normal breath sounds.  Musculoskeletal: Normal range of motion.  Skin:    General: Skin is warm and dry.  Neurological:     Mental Status: She is alert and oriented to person, place, and time.  Psychiatric:        Mood and Affect: Mood normal.        Behavior: Behavior normal.     RECENT LABS AND TESTS: BMET    Component Value Date/Time   NA 140 06/13/2018 0843   K 4.0 06/13/2018 0843   CL 103 06/13/2018 0843   CO2 22 06/13/2018 0843   GLUCOSE 117 (H) 06/13/2018 0843   GLUCOSE 123 (H) 01/13/2018 0915   BUN 22 06/13/2018 0843   CREATININE 1.39 (H) 06/13/2018  0843   CALCIUM 9.3 06/13/2018 0843   GFRNONAA 38 (L) 06/13/2018 0843   GFRAA 44 (L) 06/13/2018 0843   Lab Results  Component Value Date   HGBA1C 5.8 (H) 06/13/2018   HGBA1C 6.4 01/17/2018   HGBA1C 6.1 09/23/2015   Lab Results  Component Value Date   INSULIN 15.0 06/13/2018   INSULIN 14.0 02/22/2018   CBC    Component Value Date/Time   WBC 9.7 02/22/2018 0936   WBC 8.0 01/13/2018 0915   RBC 4.38 02/22/2018 0936   RBC 4.62 01/13/2018 0915   HGB 13.3 02/22/2018 0936   HCT 40.2 02/22/2018 0936   PLT 263 02/22/2018 0936   MCV 92 02/22/2018 0936   MCH 30.4 02/22/2018 0936   MCH 30.9 01/30/2016 0405   MCHC 33.1 02/22/2018 0936   MCHC 33.8 01/13/2018 0915   RDW 14.1 02/22/2018 0936   LYMPHSABS 1.7 02/22/2018 0936   MONOABS 0.6 01/13/2018 0915   EOSABS 0.2 02/22/2018 0936   BASOSABS 0.0 02/22/2018 0936   Iron/TIBC/Ferritin/ %Sat No results found for: IRON, TIBC, FERRITIN, IRONPCTSAT Lipid Panel     Component Value Date/Time   CHOL 238 (H) 06/13/2018 0843   TRIG 183 (H) 06/13/2018 0843   HDL 45 06/13/2018 0843   CHOLHDL 4.9 (H) 02/22/2018 0936   CHOLHDL 7 01/13/2018 0915   VLDL 53.0 (H) 01/13/2018 0915   LDLCALC 156 (H) 06/13/2018 0843   LDLDIRECT 197.0 01/13/2018 0915   Hepatic Function Panel     Component Value Date/Time   PROT 6.7 06/13/2018 0843   ALBUMIN 4.4 06/13/2018 0843   AST 13 06/13/2018 0843   ALT 16 06/13/2018 0843   ALKPHOS 85 06/13/2018 0843   BILITOT 0.7 06/13/2018 0843      Component Value Date/Time   TSH 2.010 06/13/2018 0843   TSH 2.350 02/22/2018 0936   TSH 1.97 01/13/2018 0915      OBESITY BEHAVIORAL INTERVENTION VISIT  Today's visit was #  12   Starting weight: 247 lbs Starting date: 02/22/18 Today's weight : 220 lbs  Today's date: 10/03/2018 Total lbs lost to date: 27 At least 15 minutes were spent on discussing the following behavioral intervention visit.    Most Recent Value 10/13/2013 - 10/12/2018  Height 5\' 6"  (1.676  m) 10/03/2018  Weight 220 lb (99.8 kg) 10/03/2018  BMI (Calculated) 35.53 10/03/2018  BLOOD PRESSURE - SYSTOLIC 536 4/68/0321  BLOOD PRESSURE - DIASTOLIC 78 10/16/8248   Body Fat % 45.7 % 10/03/2018  Total Body Water (lbs) 85.8 lbs 10/03/2018  RMR 1464 02/22/2018    ASK: We discussed the diagnosis of obesity with Maria Marquez today and Maria Marquez agreed to give [name] permission to discuss obesity behavioral modification therapy today.  ASSESS: Maria Marquez has the diagnosis of obesity and her BMI today is 35.53 Maria Marquez is in the action stage of change   ADVISE: Maria Marquez was educated on the multiple health risks of obesity as well as the benefit of weight loss to improve her health. She was advised of the need for long term treatment and the importance of lifestyle modifications to improve her current health and to decrease her risk of future health problems.  AGREE: Multiple dietary modification options and treatment options were discussed and  Maria Marquez agreed to follow the recommendations documented in the above note.  ARRANGE: Maria Marquez was educated on the importance of frequent visits to treat obesity as outlined per CMS and USPSTF guidelines and agreed to schedule her next follow up appointment today.  I, Trixie Dredge, am acting as transcriptionist for [name], MD  I have reviewed the above documentation for accuracy and completeness, and I agree with the above. -[name], MD

## 2018-10-06 ENCOUNTER — Ambulatory Visit
Admission: RE | Admit: 2018-10-06 | Discharge: 2018-10-06 | Disposition: A | Discharge status: Home / Self Care | Payer: Medicare HMO | Source: Ambulatory Visit | Attending: Family Medicine | Admitting: Family Medicine

## 2018-10-06 DIAGNOSIS — Z1231 Encounter for screening mammogram for malignant neoplasm of breast: Secondary | ICD-10-CM

## 2018-10-29 ENCOUNTER — Other Ambulatory Visit (INDEPENDENT_AMBULATORY_CARE_PROVIDER_SITE_OTHER): Payer: Self-pay | Admitting: Family Medicine

## 2018-10-29 DIAGNOSIS — F3289 Other specified depressive episodes: Secondary | ICD-10-CM

## 2018-11-01 ENCOUNTER — Encounter (INDEPENDENT_AMBULATORY_CARE_PROVIDER_SITE_OTHER): Payer: Self-pay | Admitting: Family Medicine

## 2018-11-01 ENCOUNTER — Other Ambulatory Visit: Payer: Self-pay

## 2018-11-01 ENCOUNTER — Other Ambulatory Visit: Payer: Self-pay | Admitting: Family Medicine

## 2018-11-01 ENCOUNTER — Ambulatory Visit (INDEPENDENT_AMBULATORY_CARE_PROVIDER_SITE_OTHER): Payer: Medicare HMO | Admitting: Family Medicine

## 2018-11-01 VITALS — BP 115/72 | HR 74 | Ht 66.0 in | Wt 222.0 lb

## 2018-11-01 DIAGNOSIS — E038 Other specified hypothyroidism: Secondary | ICD-10-CM

## 2018-11-01 DIAGNOSIS — Z6835 Body mass index (BMI) 35.0-35.9, adult: Secondary | ICD-10-CM | POA: Diagnosis not present

## 2018-11-01 DIAGNOSIS — E7849 Other hyperlipidemia: Secondary | ICD-10-CM

## 2018-11-01 DIAGNOSIS — E8881 Metabolic syndrome: Secondary | ICD-10-CM | POA: Diagnosis not present

## 2018-11-01 DIAGNOSIS — F3289 Other specified depressive episodes: Secondary | ICD-10-CM

## 2018-11-01 DIAGNOSIS — E559 Vitamin D deficiency, unspecified: Secondary | ICD-10-CM

## 2018-11-01 MED ORDER — VITAMIN D (ERGOCALCIFEROL) 1.25 MG (50000 UNIT) PO CAPS: 50000.0000 [IU] | ORAL_CAPSULE | ORAL | 0 refills | Status: DC

## 2018-11-01 MED ORDER — BUPROPION HCL ER (SR) 200 MG PO TB12: 200.0000 mg | ORAL_TABLET | Freq: Every day | ORAL | 0 refills | Status: DC

## 2018-11-01 NOTE — Progress Notes (Signed)
Office: [phone]  /  Fax: [phone]   HPI:   Chief Complaint: OBESITY Maria Marquez is here to discuss her progress with her obesity treatment plan. She is on the Category 2 plan and is following her eating plan approximately 30 % of the time. She states she is walking 15 to 20 minutes 2 to 3 times per week. Maria Marquez is not following her plan closely. She is journaling on and off, but mostly trying to portion control and smarter choices. She is disappointed that this caused weight gain. She is ready to get back on track and to a structured plan.  Her weight is 222 lb (100.7 kg) today and has had a weight gain of 2 pounds over a period of 4 weeks since her last visit. She has lost 25 lbs since starting treatment with [name].  Vitamin D Deficiency Maria Marquez has a diagnosis of vitamin D deficiency. She is currently on prescription vit D, but is not yet at goal. Maria Marquez still notes fatigue even with her vitamin D level slowly improving.  Depression with emotional eating behaviors Maria Marquez notes increased emotional eating and using food for comfort in the last month and is frustrated. She often snacks when she is not hungry. Maria Marquez sometimes feels she is out of control and then feels guilty that she made poor food choices. She has been working on behavior modification techniques to help reduce her emotional eating and has been somewhat successful. She shows no sign of suicidal or homicidal ideations.  Hyperlipidemia Maria Marquez has hyperlipidemia and is on Zetia. She is working on diet to improve her cholesterol levels with intensive lifestyle modification including a low saturated fat diet, exercise, and weight loss. Maria Marquez is due for labs today. She denies any chest pain or myalgias.  Insulin Resistance Maria Marquez has a diagnosis of insulin resistance based on her elevated fasting insulin level >5. Although Maria Marquez's blood glucose readings are still under good control, insulin resistance puts her at greater risk of metabolic  syndrome and diabetes. She is nottaking metformin currently and is attempting to improve with diet, but has been struggling more recently.She admits polyphagia with increased carbs.  Hypothyroid Maria Marquez has a diagnosis of hypothyroidism. She is on Synthroid 137 mcg and reports no changes in fatigue. She denies hot or cold intolerance.  ASSESSMENT AND PLAN:  Vitamin D deficiency - Plan: CBC With Differential, VITAMIN D 25 Hydroxy (Vit-D Deficiency, Fractures), Vitamin D, Ergocalciferol, (DRISDOL) 1.25 MG (50000 UT) CAPS capsule  Other hyperlipidemia  Insulin resistance - Plan: Comprehensive metabolic panel, Hemoglobin A1c, Insulin, random  Other specified hypothyroidism - Plan: T3, T4, free, TSH  Other depression - with emotional eating - Plan: buPROPion (WELLBUTRIN SR) 200 MG 12 hr tablet  Class 2 severe obesity with serious comorbidity and body mass index (BMI) of 35.0 to 35.9 in adult, unspecified obesity type (HCC)  PLAN:  Vitamin D Deficiency Maria Marquez was informed that low vitamin D levels contributes to fatigue and are associated with obesity, breast, and colon cancer. Maria Marquez agrees to continue to take prescription Vit D @50 ,000 IU every week #4 with no refills and will follow up for routine testing of vitamin D, at least 2-3 times per year. She was informed of the risk of over-replacement of vitamin D and agrees to not increase her dose unless she discusses this with [name] first. Maria Marquez agrees to follow up in 4 weeks as directed.  Hyperlipidemia Maria Marquez was informed of the American Heart Association Guidelines emphasizing intensive lifestyle modifications as the first  line treatment for hyperlipidemia. We discussed many lifestyle modifications today in depth, and Maria Marquez will continue to work on decreasing saturated fats such as fatty red meat, butter and many fried foods. She will also increase vegetables and lean protein in her diet and continue to work on exercise and weight loss efforts. We will  order labs today. Maria Marquez agrees to continue her diet and to follow up as directed.  Insulin Resistance Maria Marquez will continue to work on weight loss, exercise, and decreasing simple carbohydrates in her diet to help decrease the risk of diabetes. She was informed that eating too many simple carbohydrates or too many calories at one sitting increases the likelihood of GI side effects. Maria Marquez agreed to get back to her diet and we will order labs today.  Maria Marquez agreed to follow up with [name] as directed to monitor her progress.   Hypothyroid Maria Marquez was informed of the importance of good thyroid control to help with weight loss efforts. She was also informed that supertherapeutic thyroid levels are dangerous and will not improve weight loss results. Labs were drawn today. Maria Marquez agreed to continue Synthroid and to follow up as directed.  Depression with Emotional Eating Behaviors We discussed behavior modification techniques today to help Maria Marquez deal with her emotional eating and depression. She has agreed to increase Wellbutrin SR 200 mg qAM #30 with no refills and agreed to follow up as directed.  Obesity Maria Marquez is currently in the action stage of change. As such, her goal is to continue with weight loss efforts. She has agreed to keep a food journal with 1200 to 1300 calories and 75+ grams of protein.  Maria Marquez has been instructed to work up to a goal of 150 minutes of combined cardio and strengthening exercise per week for weight loss and overall health benefits. We discussed the following Behavioral Modification Strategies today: increasing lean protein intake, decreasing simple carbohydrates, and work on meal planning and easy cooking plans. Maria Marquez has agreed to see Hoyle Sauer, our registered dietitian in 2 weeks to review journaling and goals.  Maria Marquez has agreed to follow up with our clinic in 4 weeks. She was informed of the importance of frequent follow up visits to maximize her success with intensive lifestyle  modifications for her multiple health conditions.  ALLERGIES: Allergies  Allergen Reactions   Rosuvastatin Other (See Comments)    Muscle Pain in Thighs   Shellfish Allergy Hives   Sulfa Antibiotics Nausea Only   Latex Rash   Lisinopril Cough    MEDICATIONS: Current Outpatient Medications on File Prior to Visit  Medication Sig Dispense Refill   aspirin 81 MG chewable tablet Chew by mouth daily.     EPINEPHrine (EPIPEN 2-PAK) 0.3 mg/0.3 mL IJ SOAJ injection as needed.     estradiol-norethindrone (ACTIVELLA) 1-0.5 MG tablet Take 1 tablet by mouth daily. 90 tablet 1   ezetimibe (ZETIA) 10 MG tablet Take 1 tablet (10 mg total) by mouth daily. 90 tablet 3   levothyroxine (SYNTHROID, LEVOTHROID) 137 MCG tablet Take 1 tablet (137 mcg total) by mouth at bedtime. 90 tablet 3   losartan-hydrochlorothiazide (HYZAAR) 50-12.5 MG tablet Take 1 tablet by mouth daily. 90 tablet 3   omeprazole (PRILOSEC) 20 MG capsule Take 1 capsule (20 mg total) by mouth daily. 90 capsule 3   polyethylene glycol powder (GLYCOLAX/MIRALAX) powder Take 17 g by mouth 2 (two) times daily as needed. 3350 g 0   No current facility-administered medications on file prior to visit.  PAST MEDICAL HISTORY: Past Medical History:  Diagnosis Date   Acute blood loss anemia    Arthritis    Back pain    Bilious vomiting with nausea    Constipation    GERD (gastroesophageal reflux disease)    HLD (hyperlipidemia)    Hypertension    Hypothyroid    Joint pain    Leg edema    Leukocytosis    Multiple food allergies    Osteoarthritis of hip    Right   Postmenopausal hormone replacement therapy 06/17/2015   Prediabetes    Sessile colonic polyp 10/04/2017   Colonoscopy 12/2015, Dr. Fuller Plan, repeat q 5 years.    PAST SURGICAL HISTORY: Past Surgical History:  Procedure Laterality Date   EYE SURGERY  1987   HERNIA REPAIR     THYROID LOBECTOMY     right side   TOTAL HIP ARTHROPLASTY  Right 01/28/2016   Procedure: RIGHT TOTAL HIP ARTHROPLASTY ANTERIOR APPROACH;  Surgeon: [name], MD;  Location: [facility];  Service: Orthopedics;  Laterality: Right;    SOCIAL HISTORY: Social History   Tobacco Use   Smoking status: Current Every Day Smoker    Packs/day: 0.50    Types: Cigarettes   Smokeless tobacco: Current User  Substance Use Topics   Alcohol use: Yes    Comment: rarely   Drug use: No    FAMILY HISTORY: Family History  Problem Relation Age of Onset   Arthritis Mother    Breast cancer Mother    Schizophrenia Mother    Diabetes Father    Heart disease Father    Kidney disease Father    Stroke Sister    Lung cancer Brother    Heart disease Brother    Lung cancer Brother    Heart disease Brother    Vision loss Brother        Glaucoma   Alcohol abuse Brother    ROS: Review of Systems  Constitutional: Positive for malaise/fatigue. Negative for weight loss.  Cardiovascular: Negative for chest pain.  Musculoskeletal: Negative for myalgias.  Endo/Heme/Allergies:       Positive for polyphagia.  Psychiatric/Behavioral: Positive for depression. Negative for suicidal ideas.       Negative for homicidal ideations.   PHYSICAL EXAM: Blood pressure 115/72, pulse 74, height 5\' 6"  (1.676 m), weight 222 lb (100.7 kg), SpO2 94 %. Body mass index is 35.83 kg/m. Physical Exam Vitals signs reviewed.  Constitutional:      Appearance: Normal appearance. She is obese.  Cardiovascular:     Rate and Rhythm: Normal rate.  Pulmonary:     Effort: Pulmonary effort is normal.  Musculoskeletal: Normal range of motion.  Skin:    General: Skin is warm and dry.  Neurological:     Mental Status: She is alert and oriented to person, place, and time.  Psychiatric:        Mood and Affect: Mood normal.        Behavior: Behavior normal.    RECENT LABS AND TESTS: BMET    Component Value Date/Time   NA 140 06/13/2018 0843   K 4.0 06/13/2018 0843   CL  103 06/13/2018 0843   CO2 22 06/13/2018 0843   GLUCOSE 117 (H) 06/13/2018 0843   GLUCOSE 123 (H) 01/13/2018 0915   BUN 22 06/13/2018 0843   CREATININE 1.39 (H) 06/13/2018 0843   CALCIUM 9.3 06/13/2018 0843   GFRNONAA 38 (L) 06/13/2018 0843   GFRAA 44 (L) 06/13/2018 0843   Lab Results  Component Value Date   HGBA1C 5.8 (H) 06/13/2018   HGBA1C 6.4 01/17/2018   HGBA1C 6.1 09/23/2015   Lab Results  Component Value Date   INSULIN 15.0 06/13/2018   INSULIN 14.0 02/22/2018   CBC    Component Value Date/Time   WBC 9.7 02/22/2018 0936   WBC 8.0 01/13/2018 0915   RBC 4.38 02/22/2018 0936   RBC 4.62 01/13/2018 0915   HGB 13.3 02/22/2018 0936   HCT 40.2 02/22/2018 0936   PLT 263 02/22/2018 0936   MCV 92 02/22/2018 0936   MCH 30.4 02/22/2018 0936   MCH 30.9 01/30/2016 0405   MCHC 33.1 02/22/2018 0936   MCHC 33.8 01/13/2018 0915   RDW 14.1 02/22/2018 0936   LYMPHSABS 1.7 02/22/2018 0936   MONOABS 0.6 01/13/2018 0915   EOSABS 0.2 02/22/2018 0936   BASOSABS 0.0 02/22/2018 0936   Iron/TIBC/Ferritin/ %Sat No results found for: IRON, TIBC, FERRITIN, IRONPCTSAT Lipid Panel     Component Value Date/Time   CHOL 238 (H) 06/13/2018 0843   TRIG 183 (H) 06/13/2018 0843   HDL 45 06/13/2018 0843   CHOLHDL 4.9 (H) 02/22/2018 0936   CHOLHDL 7 01/13/2018 0915   VLDL 53.0 (H) 01/13/2018 0915   LDLCALC 156 (H) 06/13/2018 0843   LDLDIRECT 197.0 01/13/2018 0915   Hepatic Function Panel     Component Value Date/Time   PROT 6.7 06/13/2018 0843   ALBUMIN 4.4 06/13/2018 0843   AST 13 06/13/2018 0843   ALT 16 06/13/2018 0843   ALKPHOS 85 06/13/2018 0843   BILITOT 0.7 06/13/2018 0843      Component Value Date/Time   TSH 2.010 06/13/2018 0843   TSH 2.350 02/22/2018 0936   TSH 1.97 01/13/2018 0915   Results for Maria Marquez "NELISSA BOLDUC" (MRN [id number]) as of 11/01/2018 11:58  Ref. Range 06/13/2018 08:43  Vitamin D, 25-Hydroxy Latest Ref Range: 30.0 - 100.0 ng/mL 34.5    OBESITY BEHAVIORAL INTERVENTION VISIT  Today's visit was # 13   Starting weight: 247 lbs Starting date: 02/22/18 Today's weight : Weight: 222 lb (100.7 kg)  Today's date: 11/01/2018 Total lbs lost to date: 25 At least 15 minutes were spent on discussing the following behavioral intervention visit.    11/01/2018  Height 5\' 6"  (1.676 m)  Weight 222 lb (100.7 kg)  BMI (Calculated) 35.85  BLOOD PRESSURE - SYSTOLIC 353  BLOOD PRESSURE - DIASTOLIC 72   Body Fat % 29.9 %  Total Body Water (lbs) 83.2 lbs   ASK: We discussed the diagnosis of obesity with Maria Marquez today and Maria Marquez agreed to give [name] permission to discuss obesity behavioral modification therapy today.  ASSESS: Maria Marquez has the diagnosis of obesity and her BMI today is 35.85. Maria Marquez is in the action stage of change.   ADVISE: Maria Marquez was educated on the multiple health risks of obesity as well as the benefit of weight loss to improve her health. She was advised of the need for long term treatment and the importance of lifestyle modifications to improve her current health and to decrease her risk of future health problems.  AGREE: Multiple dietary modification options and treatment options were discussed and Maria Marquez agreed to follow the recommendations documented in the above note.  ARRANGE: Maria Marquez was educated on the importance of frequent visits to treat obesity as outlined per CMS and USPSTF guidelines and agreed to schedule her next follow up appointment today.  IMarcille Blanco, CMA, am acting as transcriptionist for Starlyn Skeans, MD  I have reviewed the above documentation for accuracy and completeness, and I agree with the above. -[name], MD

## 2018-11-02 ENCOUNTER — Ambulatory Visit: Payer: Medicare HMO

## 2018-11-02 LAB — COMPREHENSIVE METABOLIC PANEL
ALT: 15 IU/L (ref 0–32)
AST: 13 IU/L (ref 0–40)
Albumin/Globulin Ratio: 1.8 (ref 1.2–2.2)
Albumin: 4.2 g/dL (ref 3.8–4.8)
Alkaline Phosphatase: 82 IU/L (ref 39–117)
BUN/Creatinine Ratio: 14 (ref 12–28)
BUN: 17 mg/dL (ref 8–27)
Bilirubin Total: 0.6 mg/dL (ref 0.0–1.2)
CO2: 22 mmol/L (ref 20–29)
Calcium: 9.2 mg/dL (ref 8.7–10.3)
Chloride: 102 mmol/L (ref 96–106)
Creatinine, Ser: 1.2 mg/dL — ABNORMAL HIGH (ref 0.57–1.00)
GFR calc Af Amer: 53 mL/min/{1.73_m2} — ABNORMAL LOW (ref >59)
GFR calc non Af Amer: 46 mL/min/{1.73_m2} — ABNORMAL LOW (ref >59)
Globulin, Total: 2.4 g/dL (ref 1.5–4.5)
Glucose: 107 mg/dL — ABNORMAL HIGH (ref 65–99)
Potassium: 4.6 mmol/L (ref 3.5–5.2)
Sodium: 140 mmol/L (ref 134–144)
Total Protein: 6.6 g/dL (ref 6.0–8.5)

## 2018-11-02 LAB — CBC WITH DIFFERENTIAL
Basophils Absolute: 0.1 10*3/uL (ref 0.0–0.2)
Basos: 1 %
EOS (ABSOLUTE): 0.2 10*3/uL (ref 0.0–0.4)
Eos: 2 %
Hematocrit: 43 % (ref 34.0–46.6)
Hemoglobin: 14.5 g/dL (ref 11.1–15.9)
Immature Grans (Abs): 0 10*3/uL (ref 0.0–0.1)
Immature Granulocytes: 0 %
Lymphocytes Absolute: 1.7 10*3/uL (ref 0.7–3.1)
Lymphs: 19 %
MCH: 30.3 pg (ref 26.6–33.0)
MCHC: 33.7 g/dL (ref 31.5–35.7)
MCV: 90 fL (ref 79–97)
Monocytes Absolute: 0.7 10*3/uL (ref 0.1–0.9)
Monocytes: 8 %
Neutrophils Absolute: 6.4 10*3/uL (ref 1.4–7.0)
Neutrophils: 70 %
RBC: 4.79 x10E6/uL (ref 3.77–5.28)
RDW: 13.6 % (ref 11.7–15.4)
WBC: 9 10*3/uL (ref 3.4–10.8)

## 2018-11-02 LAB — LIPID PANEL WITH LDL/HDL RATIO
Cholesterol, Total: 222 mg/dL — ABNORMAL HIGH (ref 100–199)
HDL: 40 mg/dL (ref >39)
LDL Calculated: 151 mg/dL — ABNORMAL HIGH (ref 0–99)
LDl/HDL Ratio: 3.8 ratio — ABNORMAL HIGH (ref 0.0–3.2)
Triglycerides: 155 mg/dL — ABNORMAL HIGH (ref 0–149)
VLDL Cholesterol Cal: 31 mg/dL (ref 5–40)

## 2018-11-02 LAB — T4, FREE: Free T4: 1.45 ng/dL (ref 0.82–1.77)

## 2018-11-02 LAB — TSH: TSH: 4.19 u[IU]/mL (ref 0.450–4.500)

## 2018-11-02 LAB — HEMOGLOBIN A1C
Est. average glucose Bld gHb Est-mCnc: 117 mg/dL
Hgb A1c MFr Bld: 5.7 % — ABNORMAL HIGH (ref 4.8–5.6)

## 2018-11-02 LAB — VITAMIN D 25 HYDROXY (VIT D DEFICIENCY, FRACTURES): Vit D, 25-Hydroxy: 42.6 ng/mL (ref 30.0–100.0)

## 2018-11-02 LAB — T3: T3, Total: 90 ng/dL (ref 71–180)

## 2018-11-02 LAB — INSULIN, RANDOM: INSULIN: 11.7 u[IU]/mL (ref 2.6–24.9)

## 2018-11-07 ENCOUNTER — Encounter (INDEPENDENT_AMBULATORY_CARE_PROVIDER_SITE_OTHER): Payer: Self-pay

## 2018-11-08 ENCOUNTER — Other Ambulatory Visit: Payer: Self-pay | Admitting: *Deleted

## 2018-11-08 ENCOUNTER — Encounter: Payer: Self-pay | Admitting: Family Medicine

## 2018-11-08 MED ORDER — OMEPRAZOLE 20 MG PO CPDR: 20.0000 mg | DELAYED_RELEASE_CAPSULE | Freq: Every day | ORAL | 0 refills | Status: DC

## 2018-11-15 ENCOUNTER — Encounter (INDEPENDENT_AMBULATORY_CARE_PROVIDER_SITE_OTHER): Payer: Self-pay

## 2018-11-19 ENCOUNTER — Other Ambulatory Visit (INDEPENDENT_AMBULATORY_CARE_PROVIDER_SITE_OTHER): Payer: Self-pay | Admitting: Family Medicine

## 2018-11-19 DIAGNOSIS — E559 Vitamin D deficiency, unspecified: Secondary | ICD-10-CM

## 2018-11-20 ENCOUNTER — Ambulatory Visit (INDEPENDENT_AMBULATORY_CARE_PROVIDER_SITE_OTHER): Payer: Medicare HMO | Admitting: Dietician

## 2018-11-21 ENCOUNTER — Other Ambulatory Visit (INDEPENDENT_AMBULATORY_CARE_PROVIDER_SITE_OTHER): Payer: Self-pay | Admitting: Family Medicine

## 2018-11-21 DIAGNOSIS — F3289 Other specified depressive episodes: Secondary | ICD-10-CM

## 2018-11-23 ENCOUNTER — Ambulatory Visit (INDEPENDENT_AMBULATORY_CARE_PROVIDER_SITE_OTHER): Payer: Medicare HMO | Admitting: Family Medicine

## 2018-11-23 ENCOUNTER — Encounter: Payer: Self-pay | Admitting: Family Medicine

## 2018-11-23 ENCOUNTER — Other Ambulatory Visit: Payer: Self-pay

## 2018-11-23 ENCOUNTER — Ambulatory Visit: Payer: Medicare HMO

## 2018-11-23 DIAGNOSIS — M159 Polyosteoarthritis, unspecified: Secondary | ICD-10-CM

## 2018-11-23 DIAGNOSIS — Z79890 Hormone replacement therapy (postmenopausal): Secondary | ICD-10-CM | POA: Diagnosis not present

## 2018-11-23 DIAGNOSIS — M791 Myalgia, unspecified site: Secondary | ICD-10-CM

## 2018-11-23 DIAGNOSIS — M150 Primary generalized (osteo)arthritis: Secondary | ICD-10-CM

## 2018-11-23 DIAGNOSIS — E890 Postprocedural hypothyroidism: Secondary | ICD-10-CM

## 2018-11-23 DIAGNOSIS — I10 Essential (primary) hypertension: Secondary | ICD-10-CM | POA: Diagnosis not present

## 2018-11-23 DIAGNOSIS — F17200 Nicotine dependence, unspecified, uncomplicated: Secondary | ICD-10-CM

## 2018-11-23 DIAGNOSIS — M8949 Other hypertrophic osteoarthropathy, multiple sites: Secondary | ICD-10-CM

## 2018-11-23 DIAGNOSIS — T466X5A Adverse effect of antihyperlipidemic and antiarteriosclerotic drugs, initial encounter: Secondary | ICD-10-CM | POA: Insufficient documentation

## 2018-11-23 DIAGNOSIS — E782 Mixed hyperlipidemia: Secondary | ICD-10-CM

## 2018-11-23 HISTORY — DX: Adverse effect of antihyperlipidemic and antiarteriosclerotic drugs, initial encounter: T46.6X5A

## 2018-11-23 MED ORDER — LEVOTHYROXINE SODIUM 137 MCG PO TABS: 137.0000 ug | ORAL_TABLET | Freq: Every day | ORAL | 3 refills | Status: DC

## 2018-11-23 MED ORDER — SIMVASTATIN 10 MG PO TABS: 10.0000 mg | ORAL_TABLET | ORAL | 1 refills | Status: DC

## 2018-11-23 MED ORDER — COENZYME Q10 30 MG PO CAPS: 30.0000 mg | ORAL_CAPSULE | Freq: Every day | ORAL | Status: DC

## 2018-11-23 NOTE — Patient Instructions (Addendum)
Please return in 3 months for your annual complete physical; please come fasting. AWV at patient's convenience.   Please start CoEnzyme Q-10 daily and start simvastatin once nightly for 2 weeks then increase to twice a week if tolerated.  Continue your zetia.   If you have any questions or concerns, please don't hesitate to send me a message via MyChart or call the office at [phone]. Thank you for visiting with [name] today! It's our pleasure caring for you.   Osteoarthritis  Osteoarthritis is a type of arthritis that affects tissue that covers the ends of bones in joints (cartilage). Cartilage acts as a cushion between the bones and helps them move smoothly. Osteoarthritis results when cartilage in the joints gets worn down. Osteoarthritis is sometimes called "wear and tear" arthritis. Osteoarthritis is the most common form of arthritis. It often occurs in older people. It is a condition that gets worse over time (a progressive condition). Joints that are most often affected by this condition are in:  Fingers.  Toes.  Hips.  Knees.  Spine, including neck and lower back. What are the causes? This condition is caused by age-related wearing down of cartilage that covers the ends of bones. What increases the risk? The following factors may make you more likely to develop this condition:  Older age.  Being overweight or obese.  Overuse of joints, such as in athletes.  Past injury of a joint.  Past surgery on a joint.  Family history of osteoarthritis. What are the signs or symptoms? The main symptoms of this condition are pain, swelling, and stiffness in the joint. The joint may lose its shape over time. Small pieces of bone or cartilage may break off and float inside of the joint, which may cause more pain and damage to the joint. Small deposits of bone (osteophytes) may grow on the edges of the joint. Other symptoms may include:  A grating or scraping feeling inside the joint  when you move it.  Popping or creaking sounds when you move. Symptoms may affect one or more joints. Osteoarthritis in a major joint, such as your knee or hip, can make it painful to walk or exercise. If you have osteoarthritis in your hands, you might not be able to grip items, twist your hand, or control small movements of your hands and fingers (fine motor skills). How is this diagnosed? This condition may be diagnosed based on:  Your medical history.  A physical exam.  Your symptoms.  X-rays of the affected joint(s).  Blood tests to rule out other types of arthritis. How is this treated? There is no cure for this condition, but treatment can help to control pain and improve joint function. Treatment plans may include:  A prescribed exercise program that allows for rest and joint relief. You may work with a physical therapist.  A weight control plan.  Pain relief techniques, such as: ? Applying heat and cold to the joint. ? Electric pulses delivered to nerve endings under the skin (transcutaneous electrical nerve stimulation, or TENS). ? Massage. ? Certain nutritional supplements.  NSAIDs or prescription medicines to help relieve pain.  Medicine to help relieve pain and inflammation (corticosteroids). This can be given by mouth (orally) or as an injection.  Assistive devices, such as a brace, wrap, splint, specialized glove, or cane.  Surgery, such as: ? An osteotomy. This is done to reposition the bones and relieve pain or to remove loose pieces of bone and cartilage. ? Joint replacement surgery.  You may need this surgery if you have very bad (advanced) osteoarthritis. Follow these instructions at home: Activity  Rest your affected joints as directed by your health care provider.  Do not drive or use heavy machinery while taking prescription pain medicine.  Exercise as directed. Your health care provider or physical therapist may recommend specific types of exercise,  such as: ? Strengthening exercises. These are done to strengthen the muscles that support joints that are affected by arthritis. They can be performed with weights or with exercise bands to add resistance. ? Aerobic activities. These are exercises, such as brisk walking or water aerobics, that get your heart pumping. ? Range-of-motion activities. These keep your joints easy to move. ? Balance and agility exercises. Managing pain, stiffness, and swelling      If directed, apply heat to the affected area as often as told by your health care provider. Use the heat source that your health care provider recommends, such as a moist heat pack or a heating pad. ? If you have a removable assistive device, remove it as told by your health care provider. ? Place a towel between your skin and the heat source. If your health care provider tells you to keep the assistive device on while you apply heat, place a towel between the assistive device and the heat source. ? Leave the heat on for 20-30 minutes. ? Remove the heat if your skin turns bright red. This is especially important if you are unable to feel pain, heat, or cold. You may have a greater risk of getting burned.  If directed, put ice on the affected joint: ? If you have a removable assistive device, remove it as told by your health care provider. ? Put ice in a plastic bag. ? Place a towel between your skin and the bag. If your health care provider tells you to keep the assistive device on during icing, place a towel between the assistive device and the bag. ? Leave the ice on for 20 minutes, 2-3 times a day. General instructions  Take over-the-counter and prescription medicines only as told by your health care provider.  Maintain a healthy weight. Follow instructions from your health care provider for weight control. These may include dietary restrictions.  Do not use any products that contain nicotine or tobacco, such as cigarettes and  e-cigarettes. These can delay bone healing. If you need help quitting, ask your health care provider.  Use assistive devices as directed by your health care provider.  Keep all follow-up visits as told by your health care provider. This is important. Where to find more information  [employer] of Arthritis and Musculoskeletal and Skin Diseases: www.niams.SouthExposed.es  [employer] on Aging: http://kim-miller.com/  American College of Rheumatology: www.rheumatology.org Contact a health care provider if:  Your skin turns red.  You develop a rash.  You have pain that gets worse.  You have a fever along with joint or muscle aches. Get help right away if:  You lose a lot of weight.  You suddenly lose your appetite.  You have night sweats. Summary  Osteoarthritis is a type of arthritis that affects tissue covering the ends of bones in joints (cartilage).  This condition is caused by age-related wearing down of cartilage that covers the ends of bones.  The main symptom of this condition is pain, swelling, and stiffness in the joint.  There is no cure for this condition, but treatment can help to control pain and improve joint function.  This information is not intended to replace advice given to you by your health care provider. Make sure you discuss any questions you have with your health care provider. Document Released: 08/09/2005 Document Revised: 05/16/2017 Document Reviewed: 04/12/2016 Elsevier Interactive Patient Education  2019 [employer].

## 2018-11-23 NOTE — Progress Notes (Signed)
I have discussed the procedure for the virtual visit with the patient who has given consent to proceed with assessment and treatment.   Maria Marquez, CMA     

## 2018-11-23 NOTE — Progress Notes (Signed)
Virtual Visit via Video Note  Subjective  CC:  Chief Complaint  Patient presents with  . Hypertension  . Hypothyroidism  . Hyperlipidemia    HPI:  I connected with Maria Marquez on 11/23/18 at  2:00 PM EDT by a video enabled telemedicine application and verified that I am speaking with the correct person using two identifiers. Location patient: Home Location provider: [employer], Office Persons participating in the virtual visit: Jarae Nemmers, Leamon Arnt, MD Lilli Light, [place] discussed the limitations of evaluation and management by telemedicine and the availability of in person appointments. The patient expressed understanding and agreed to proceed. . F/u chronic problems: my last visit for these were back in may 2019; however she has been being followed by the wellness clinic and I have reviewed those notes.  o Down 25 pound o wellcontrolled HTN on meds. Feeling well. Taking medications w/o adverse effects. No symptoms of CHF, angina; no palpitations, sob, cp or lower extremity edema. Compliant with meds.  o HLD: failed crestor in past due to chronic myalgias; now on zetia but ascvd risk score and LDL remain elevated in spite of improved diet and weight loss.  o Hypothyroidism is controlled on meds. Due refill.  o Doing well on HRT. Understands risks/benefits.  o Smoker: down to 10 cig/day. Started wellbutrin but no behavior changes as of yet. Not very motivated to quit fully. Denies sob or cough.  o C/o arthritic pain in hands: distal joints, bilateral.  Assessment  1. Essential hypertension   2. Mixed hyperlipidemia   3. Morbid obesity ([name])   4. Post-menopause on HRT (hormone replacement therapy)   5. Postoperative hypothyroidism   6. Primary osteoarthritis involving multiple joints   7. Tobacco dependence   8. Myalgia due to statin      Plan   Chronic problem f/u:  Most are well controlled. Will rechallenge with statin. Try to get  twice a week with zetia and coenzyme q10. Will recheck in 12 weeks. BP is controlled as is thyroid. Refilled meds. Continue with weight loss program who will check her labs next.   HRT - stable.   Tobacco cessation counseling given.   I discussed the assessment and treatment plan with the patient. The patient was provided an opportunity to ask questions and all were answered. The patient agreed with the plan and demonstrated an understanding of the instructions.   The patient was advised to call back or seek an in-person evaluation if the symptoms worsen or if the condition fails to improve as anticipated. Follow up: Return in about 3 months (around 02/22/2019) for complete physical, AWV at patient's convenience.  Visit date not found  Meds ordered this encounter  Medications  . simvastatin (ZOCOR) 10 MG tablet    Sig: Take 1 tablet (10 mg total) by mouth 2 (two) times a week.    Dispense:  90 tablet    Refill:  1  . co-enzyme Q-10 30 MG capsule    Sig: Take 1 capsule (30 mg total) by mouth daily.  [name] levothyroxine (SYNTHROID, LEVOTHROID) 137 MCG tablet    Sig: Take 1 tablet (137 mcg total) by mouth at bedtime.    Dispense:  90 tablet    Refill:  3      I reviewed the patients updated PMH, FH, and SocHx.    Patient Active Problem List   Diagnosis Date Noted  . Tobacco dependence 12/09/2015    Priority: High  .  Essential hypertension 06/17/2015    Priority: High  . Mixed hyperlipidemia 06/17/2015    Priority: High  . Morbid obesity ([place]) 06/17/2015    Priority: High  . Post-menopause on HRT (hormone replacement therapy) 06/17/2015    Priority: High  . Postoperative hypothyroidism 06/17/2015    Priority: High  . Primary osteoarthritis involving multiple joints 06/17/2015    Priority: High  . Sessile colonic polyp 10/04/2017    Priority: Medium  . Status post right hip replacement 07/21/2015    Priority: Medium  . ACE inhibitor intolerance 06/17/2015    Priority: Medium   . DJD (degenerative joint disease), lumbar 06/17/2015    Priority: Medium  . Gastroesophageal reflux disease without esophagitis 06/17/2015    Priority: Medium  . Umbilical hernia 51/88/4166    Priority: Low  . Hematuria 07/28/2015    Priority: Low  . Myalgia due to statin 11/23/2018   Current Meds  Medication Sig  . aspirin 81 MG chewable tablet Chew by mouth daily.  [name] buPROPion (WELLBUTRIN SR) 200 MG 12 hr tablet TAKE 1 TABLET BY MOUTH EVERY DAY  . EPINEPHrine (EPIPEN 2-PAK) 0.3 mg/0.3 mL IJ SOAJ injection as needed.  [name] estradiol-norethindrone (ACTIVELLA) 1-0.5 MG tablet Take 1 tablet by mouth daily.  [name] ezetimibe (ZETIA) 10 MG tablet TAKE 1 TABLET EVERY DAY  . levothyroxine (SYNTHROID, LEVOTHROID) 137 MCG tablet Take 1 tablet (137 mcg total) by mouth at bedtime.  [name] losartan-hydrochlorothiazide (HYZAAR) 50-12.5 MG tablet TAKE 1 TABLET EVERY DAY  . omeprazole (PRILOSEC) 20 MG capsule Take 1 capsule (20 mg total) by mouth daily.  . Probiotic Product (PROBIOTIC DAILY PO) Take by mouth.  . Vitamin D, Ergocalciferol, (DRISDOL) 1.25 MG (50000 UT) CAPS capsule Take 1 capsule (50,000 Units total) by mouth every 7 (seven) days.  . [DISCONTINUED] levothyroxine (SYNTHROID, LEVOTHROID) 137 MCG tablet Take 1 tablet (137 mcg total) by mouth at bedtime.    Allergies: Patient is allergic to rosuvastatin; shellfish allergy; sulfa antibiotics; latex; and lisinopril. Family History: Patient family history includes Alcohol abuse in her brother; Arthritis in her mother; Breast cancer in her mother; Diabetes in her father; Heart disease in her brother, brother, and father; Kidney disease in her father; Lung cancer in her brother and brother; Schizophrenia in her mother; Stroke in her sister; Vision loss in her brother. Social History:  Patient  reports that she has been smoking cigarettes. She has been smoking about 0.25 packs per day. She uses smokeless tobacco. She reports current alcohol use. She  reports that she does not use drugs.  OBJECTIVE Vitals: There were no vitals taken for this visit. nl Bps reviewed at recent visit with healthy weight and wellness General: no acute distress , A&Ox3 Normal respirations nomral affect and mood Hands: distal OA changes are prominent  Lab Results  Component Value Date   CHOL 222 (H) 11/01/2018   CHOL 238 (H) 06/13/2018   CHOL 210 (H) 02/22/2018   Lab Results  Component Value Date   HDL 40 11/01/2018   HDL 45 06/13/2018   HDL 43 02/22/2018   Lab Results  Component Value Date   LDLCALC 151 (H) 11/01/2018   LDLCALC 156 (H) 06/13/2018   LDLCALC 128 (H) 02/22/2018   Lab Results  Component Value Date   TRIG 155 (H) 11/01/2018   TRIG 183 (H) 06/13/2018   TRIG 197 (H) 02/22/2018   Lab Results  Component Value Date   CHOLHDL 4.9 (H) 02/22/2018   CHOLHDL 7 01/13/2018   Lab  Results  Component Value Date   LDLDIRECT 197.0 01/13/2018  The 10-year ASCVD risk score Mikey Bussing DC Brooke Bonito., et al., 2013) is: 17.9%   Values used to calculate the score:     Age: 30 years     Sex: Female     Is Non-Hispanic African American: No     Diabetic: No     Tobacco smoker: Yes     Systolic Blood Pressure: 412 mmHg     Is BP treated: Yes     HDL Cholesterol: 40 mg/dL     Total Cholesterol: 222 mg/dL Lab Results  Component Value Date   HGBA1C 5.7 (H) 11/01/2018   Lab Results  Component Value Date   CREATININE 1.20 (H) 11/01/2018   BUN 17 11/01/2018   NA 140 11/01/2018   K 4.6 11/01/2018   CL 102 11/01/2018   CO2 22 11/01/2018   Lab Results  Component Value Date   TSH 4.190 11/01/2018    Leamon Arnt, MD

## 2018-11-28 ENCOUNTER — Other Ambulatory Visit: Payer: Self-pay

## 2018-11-28 ENCOUNTER — Ambulatory Visit (INDEPENDENT_AMBULATORY_CARE_PROVIDER_SITE_OTHER): Payer: Medicare HMO | Admitting: Family Medicine

## 2018-11-28 DIAGNOSIS — F3289 Other specified depressive episodes: Secondary | ICD-10-CM

## 2018-11-28 DIAGNOSIS — Z6835 Body mass index (BMI) 35.0-35.9, adult: Secondary | ICD-10-CM

## 2018-11-28 NOTE — Progress Notes (Signed)
Marquez: [phone]  /  Fax: [phone] TeleHealth Visit:  Maria Marquez has verbally consented to this TeleHealth visit today. The patient is located at home, the provider is located at the Maria [facility]. The participants in this visit include the listed provider and patient. The visit was conducted today via Face Time.  HPI:   Chief Complaint: OBESITY Maria Marquez is here to discuss her progress with her obesity treatment plan. She is on the Category 2 plan and is following her eating plan approximately 70 % of the time. She states she is dancing in the Marquez. Maria Marquez is struggling to stay on track with her plan. She is especially struggling with the social isolation from Maria Marquez. She thinks that she has gained a couple of pounds. She has an abscessed tooth and is not eating as much protein.   We were unable to weigh the patient today for this TeleHealth visit. She feels as if she has gained weight since her last visit. She has lost 25 lbs since starting treatment with Maria Marquez.  Depression with emotional eating behaviors Maria Marquez is stable on Wellbutrin. She is struggling with reduced socialization and feeling lonely while in Maria Marquez isolation. She is trying to stay busy. She has been working on behavior modification techniques to help reduce her emotional eating and has been somewhat successful. She shows no sign of suicidal or homicidal ideations.  Depression screen [facility] 2/9 11/23/2018 02/22/2018 12/12/2017 10/04/2017  Decreased Interest 0 3 3 0  Down, Depressed, Hopeless 0 3 2 0  PHQ - 2 Score 0 6 5 0  Altered sleeping - 2 3 -  Tired, decreased energy - 2 2 -  Change in appetite - 2 2 -  Feeling bad or failure about yourself  - 1 0 -  Trouble concentrating - 0 1 -  Moving slowly or fidgety/restless - 0 0 -  Suicidal thoughts - 0 0 -  PHQ-9 Score - 13 13 -  Difficult doing work/chores - Somewhat difficult Somewhat difficult -   ASSESSMENT AND PLAN:  Other depression - with  emotional eating  Class 2 severe obesity with serious comorbidity and body mass index (BMI) of 35.0 to 35.9 in adult, unspecified obesity type (HCC)  PLAN:  Depression with Emotional Eating Behaviors We discussed behavior modification techniques today to help Maria Marquez deal with her loneliness and isolation. Maria Marquez was offered support and comfort. She has agreed to continue to take Wellbutrin SR 200 mg and agreed to follow up as directed.  I spent > than 50% of the 25 minute visit on counseling as documented in the note.  Obesity Maria Marquez is currently in the action stage of change. As such, her goal is to continue with weight loss efforts. She has agreed to follow the Category 2 plan. Maria Marquez has been instructed to work up to a goal of 150 minutes of combined cardio and strengthening exercise per week for weight loss and overall health benefits. We discussed the following Behavioral Modification Strategies today: increasing lean protein intake, no skipping meals, and emotional eating strategies. We discussed soft proteins, such as milk, eggs, fish, and yogurt.  Maria Marquez has agreed to follow up with our clinic in 3 weeks. She was informed of the importance of frequent follow up visits to maximize her success with intensive lifestyle modifications for her multiple health conditions.  ALLERGIES: Allergies  Allergen Reactions  . Rosuvastatin Other (See Comments)    Muscle Pain in Thighs  . Shellfish Allergy Hives  .  Sulfa Antibiotics Nausea Only  . Latex Rash  . Lisinopril Cough    MEDICATIONS: Current Outpatient Medications on File Prior to Visit  Medication Sig Dispense Refill  . aspirin 81 MG chewable tablet Chew by mouth daily.    Maria Marquez buPROPion (WELLBUTRIN SR) 200 MG 12 hr tablet TAKE 1 TABLET BY MOUTH EVERY DAY 30 tablet 0  . co-enzyme Q-10 30 MG capsule Take 1 capsule (30 mg total) by mouth daily.    Maria Marquez EPINEPHrine (EPIPEN 2-PAK) 0.3 mg/0.3 mL IJ SOAJ injection as needed.    Maria Marquez  estradiol-norethindrone (ACTIVELLA) 1-0.5 MG tablet Take 1 tablet by mouth daily. 90 tablet 1  . ezetimibe (ZETIA) 10 MG tablet TAKE 1 TABLET EVERY DAY 90 tablet 2  . levothyroxine (SYNTHROID, LEVOTHROID) 137 MCG tablet Take 1 tablet (137 mcg total) by mouth at bedtime. 90 tablet 3  . losartan-hydrochlorothiazide (HYZAAR) 50-12.5 MG tablet TAKE 1 TABLET EVERY DAY 90 tablet 2  . omeprazole (PRILOSEC) 20 MG capsule Take 1 capsule (20 mg total) by mouth daily. 90 capsule 0  . Probiotic Product (PROBIOTIC DAILY PO) Take by mouth.    . simvastatin (ZOCOR) 10 MG tablet Take 1 tablet (10 mg total) by mouth 2 (two) times a week. 90 tablet 1  . Vitamin D, Ergocalciferol, (DRISDOL) 1.25 MG (50000 UT) CAPS capsule Take 1 capsule (50,000 Units total) by mouth every 7 (seven) days. 4 capsule 0   No current facility-administered medications on file prior to visit.     PAST MEDICAL HISTORY: Past Medical History:  Diagnosis Date  . Acute blood loss anemia   . Arthritis   . Back pain   . Bilious vomiting with nausea   . Constipation   . GERD (gastroesophageal reflux disease)   . HLD (hyperlipidemia)   . Hypertension   . Hypothyroid   . Joint pain   . Leg edema   . Leukocytosis   . Multiple food allergies   . Myalgia due to statin 11/23/2018  . Osteoarthritis of hip    Right  . Postmenopausal hormone replacement therapy 06/17/2015  . Prediabetes   . Sessile colonic polyp 10/04/2017   Colonoscopy 12/2015, Dr. Fuller Plan, repeat q 5 years.    PAST SURGICAL HISTORY: Past Surgical History:  Procedure Laterality Date  . EYE SURGERY  1987  . HERNIA REPAIR    . THYROID LOBECTOMY     right side  . 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 Smoking status: Current Every Day Smoker    Packs/day: 0.25     Types: Cigarettes  . Smokeless tobacco: Current User  Substance Use Topics  . Alcohol use: Yes    Comment: rarely  . Drug use: No    FAMILY HISTORY: Family History  Problem Relation Age of Onset  . Arthritis Mother   . Breast cancer Mother   . Schizophrenia Mother   . Diabetes Father   . Heart disease Father   . Kidney disease Father   . Stroke Sister   . Lung cancer Brother   . Heart disease Brother   . Lung cancer Brother   . Heart disease Brother   . Vision loss Brother        Glaucoma  . Alcohol abuse Brother     ROS: Review of Systems  Psychiatric/Behavioral: Positive for depression. Negative for suicidal ideas.       Negative for homicidal ideations.    PHYSICAL EXAM: Pt in no acute distress  RECENT LABS AND TESTS: BMET    Component Value Date/Time   NA 140 11/01/2018 1306   K 4.6 11/01/2018 1306   CL 102 11/01/2018 1306   CO2 22 11/01/2018 1306   GLUCOSE 107 (H) 11/01/2018 1306   GLUCOSE 123 (H) 01/13/2018 0915   BUN 17 11/01/2018 1306   CREATININE 1.20 (H) 11/01/2018 1306   CALCIUM 9.2 11/01/2018 1306   GFRNONAA 46 (L) 11/01/2018 1306   GFRAA 53 (L) 11/01/2018 1306   Lab Results  Component Value Date   HGBA1C 5.7 (H) 11/01/2018   HGBA1C 5.8 (H) 06/13/2018   HGBA1C 6.4 01/17/2018   HGBA1C 6.1 09/23/2015   Lab Results  Component Value Date   INSULIN 11.7 11/01/2018   INSULIN 15.0 06/13/2018   INSULIN 14.0 02/22/2018   CBC    Component Value Date/Time   WBC 9.0 11/01/2018 1306   WBC 8.0 01/13/2018 0915   RBC 4.79 11/01/2018 1306   RBC 4.62 01/13/2018 0915   HGB 14.5 11/01/2018 1306   HCT 43.0 11/01/2018 1306   PLT 263 02/22/2018 0936   MCV 90 11/01/2018 1306   MCH 30.3 11/01/2018 1306   MCH 30.9 01/30/2016 0405   MCHC 33.7 11/01/2018 1306   MCHC 33.8 01/13/2018 0915   RDW 13.6 11/01/2018 1306   LYMPHSABS 1.7 11/01/2018 1306   MONOABS 0.6 01/13/2018 0915   EOSABS 0.2 11/01/2018 1306   BASOSABS 0.1 11/01/2018 1306    Iron/TIBC/Ferritin/ %Sat No results found for: IRON, TIBC, FERRITIN, IRONPCTSAT Lipid Panel     Component Value Date/Time   CHOL 222 (H) 11/01/2018 1306   TRIG 155 (H) 11/01/2018 1306   HDL 40 11/01/2018 1306   CHOLHDL 4.9 (H) 02/22/2018 0936   CHOLHDL 7 01/13/2018 0915   VLDL 53.0 (H) 01/13/2018 0915   LDLCALC 151 (H) 11/01/2018 1306   LDLDIRECT 197.0 01/13/2018 0915   Hepatic Function Panel     Component Value Date/Time   PROT 6.6 11/01/2018 1306   ALBUMIN 4.2 11/01/2018 1306   AST 13 11/01/2018 1306   ALT 15 11/01/2018 1306   ALKPHOS 82 11/01/2018 1306   BILITOT 0.6 11/01/2018 1306      Component Value Date/Time   TSH 4.190 11/01/2018 1306   TSH 2.010 06/13/2018 0843   TSH 2.350 02/22/2018 0936   Results for SIBONEY, REQUEJO "AMAL RENBARGER" (MRN [id number]) as of 11/28/2018 15:12  Ref. Range 11/01/2018 13:06  Vitamin D, 25-Hydroxy Latest Ref Range: 30.0 - 100.0 ng/mL 42.6     I, Marcille Blanco, CMA, am acting as transcriptionist for Starlyn Skeans, MD I have reviewed the above documentation for accuracy and completeness, and I agree with the above. -[name], MD

## 2018-11-30 ENCOUNTER — Other Ambulatory Visit (INDEPENDENT_AMBULATORY_CARE_PROVIDER_SITE_OTHER): Payer: Self-pay | Admitting: Family Medicine

## 2018-11-30 DIAGNOSIS — E559 Vitamin D deficiency, unspecified: Secondary | ICD-10-CM

## 2018-11-30 DIAGNOSIS — F3289 Other specified depressive episodes: Secondary | ICD-10-CM

## 2018-12-02 ENCOUNTER — Other Ambulatory Visit (INDEPENDENT_AMBULATORY_CARE_PROVIDER_SITE_OTHER): Payer: Self-pay | Admitting: Family Medicine

## 2018-12-02 DIAGNOSIS — F3289 Other specified depressive episodes: Secondary | ICD-10-CM

## 2018-12-03 ENCOUNTER — Other Ambulatory Visit (INDEPENDENT_AMBULATORY_CARE_PROVIDER_SITE_OTHER): Payer: Self-pay | Admitting: Family Medicine

## 2018-12-03 DIAGNOSIS — F3289 Other specified depressive episodes: Secondary | ICD-10-CM

## 2018-12-10 ENCOUNTER — Other Ambulatory Visit (INDEPENDENT_AMBULATORY_CARE_PROVIDER_SITE_OTHER): Payer: Self-pay | Admitting: Family Medicine

## 2018-12-10 DIAGNOSIS — E559 Vitamin D deficiency, unspecified: Secondary | ICD-10-CM

## 2018-12-13 ENCOUNTER — Other Ambulatory Visit (INDEPENDENT_AMBULATORY_CARE_PROVIDER_SITE_OTHER): Payer: Self-pay | Admitting: Family Medicine

## 2018-12-13 ENCOUNTER — Encounter (INDEPENDENT_AMBULATORY_CARE_PROVIDER_SITE_OTHER): Payer: Self-pay | Admitting: Family Medicine

## 2018-12-13 DIAGNOSIS — E559 Vitamin D deficiency, unspecified: Secondary | ICD-10-CM

## 2018-12-18 ENCOUNTER — Ambulatory Visit (INDEPENDENT_AMBULATORY_CARE_PROVIDER_SITE_OTHER): Payer: Medicare HMO | Admitting: Family Medicine

## 2018-12-18 ENCOUNTER — Encounter (INDEPENDENT_AMBULATORY_CARE_PROVIDER_SITE_OTHER): Payer: Self-pay | Admitting: Family Medicine

## 2018-12-18 ENCOUNTER — Other Ambulatory Visit: Payer: Self-pay

## 2018-12-18 DIAGNOSIS — E559 Vitamin D deficiency, unspecified: Secondary | ICD-10-CM | POA: Diagnosis not present

## 2018-12-18 DIAGNOSIS — N183 Chronic kidney disease, stage 3 unspecified: Secondary | ICD-10-CM

## 2018-12-18 DIAGNOSIS — F3289 Other specified depressive episodes: Secondary | ICD-10-CM

## 2018-12-18 DIAGNOSIS — Z6835 Body mass index (BMI) 35.0-35.9, adult: Secondary | ICD-10-CM

## 2018-12-18 MED ORDER — VITAMIN D (ERGOCALCIFEROL) 1.25 MG (50000 UNIT) PO CAPS: 50000.0000 [IU] | ORAL_CAPSULE | ORAL | 0 refills | Status: DC

## 2018-12-18 MED ORDER — BUPROPION HCL ER (SR) 200 MG PO TB12: 200.0000 mg | ORAL_TABLET | Freq: Every day | ORAL | 0 refills | Status: DC

## 2018-12-18 NOTE — Progress Notes (Signed)
Office: [phone]  /  Fax: [phone] TeleHealth Visit:  [name] has verbally consented to this TeleHealth visit today. The patient is located at home, the provider is located at the [facility] office. The participants in this visit include the listed provider and patient. Mattisen was unable to use Face Time today and the telehealth visit was conducted via telephone.  HPI:   Chief Complaint: OBESITY Maria Marquez is here to discuss her progress with her obesity treatment plan. She is on the Category 2 plan and is following her eating plan approximately 60 % of the time. She states she is exercising 0 minutes 0 times per week. Maria Marquez is recovering from a dental abscess and then a root canal. She was unable to chew and had to drink her food. She has tried to increase protein with milk and yogurt. She has questions about her protein intake considering her Stage 3 chronic kidney disease.  We were unable to weigh the patient today for this TeleHealth visit. She feels as if she has gained weight since her last visit. She has lost 25 lbs since starting treatment with [name].  Vitamin D Deficiency Maria Marquez has a diagnosis of vitamin D deficiency. She is currently stable on vit D, but is not yet at goal. Maria Marquez denies nausea, vomiting, or muscle weakness.  Chronic Kidney Disease Stage 3 [name] has questions about protein intake and kidney function. She would like to know about what the recommended amount of protein is for her.  Depression with emotional eating behaviors Maria Marquez's mood is stable on Wellbutrin. She is feeling anxious about COVID19, but not overly anxious. Maria Marquez is struggling with emotional eating and using food for comfort to the extent that it is negatively impacting her health. She often snacks when she is not hungry. Maria Marquez sometimes feels she is out of control and then feels guilty that she made poor food choices. She has been working on behavior modification techniques to help  reduce her emotional eating and has been somewhat successful. She shows no sign of suicidal or homicidal ideations.  Depression screen Maria Marquez 2/9 11/23/2018 02/22/2018 12/12/2017 10/04/2017  Decreased Interest 0 3 3 0  Down, Depressed, Hopeless 0 3 2 0  PHQ - 2 Score 0 6 5 0  Altered sleeping - 2 3 -  Tired, decreased energy - 2 2 -  Change in appetite - 2 2 -  Feeling bad or failure about yourself  - 1 0 -  Trouble concentrating - 0 1 -  Moving slowly or fidgety/restless - 0 0 -  Suicidal thoughts - 0 0 -  PHQ-9 Score - 13 13 -  Difficult doing work/chores - Somewhat difficult Somewhat difficult -   ASSESSMENT AND PLAN:  Vitamin D deficiency - Plan: Vitamin D, Ergocalciferol, (DRISDOL) 1.25 MG (50000 UT) CAPS capsule  Stage 3 chronic kidney disease (HCC)  Other depression - with emotional eating - Plan: buPROPion (WELLBUTRIN SR) 200 MG 12 hr tablet  Class 2 severe obesity with serious comorbidity and body mass index (BMI) of 35.0 to 35.9 in adult, unspecified obesity type (HCC)  PLAN:  Vitamin D Deficiency Maria Marquez was informed that low vitamin D levels contribute to fatigue and are associated with obesity, breast, and colon cancer. Maria Marquez agrees to continue to take prescription Vit D @50 ,000 IU every week #4 with no refills and will follow up for routine testing of vitamin D, at least 2-3 times per year. She was informed of the risk of over-replacement of  vitamin D and agrees to not increase her dose unless she discusses this with [name] first. Maria Marquez agrees to follow up in 2 weeks as directed.  Depression with Emotional Eating Behaviors We discussed behavior modification techniques today to help [name] deal with her emotional eating and depression. She has agreed to take Wellbutrin SR 200 mg qd #30 with no refills and agreed to follow up as directed.  Chronic Kidney Disease Stage 3 Maria Marquez was advised that for her, 0.8 g/kg of protein a day is her goal and her Category 2 plan has about 70 to 80 g,  which is at goal. She was informed that this is not a high protein diet, but an adequate protein diet. Maria Marquez was satisfied with that answer.  Obesity Maria Marquez is currently in the action stage of change. As such, her goal is to continue with weight loss efforts. She has agreed to follow the Category 2 plan. Maria Marquez has been instructed to work up to a goal of 150 minutes of combined cardio and strengthening exercise per week for weight loss and overall health benefits. We discussed the following Behavioral Modification Strategies today: decreasing sodium intake, increase H2O intake, and keeping healthy foods in the home.  Maria Marquez has agreed to follow up with our clinic in 2 weeks. She was informed of the importance of frequent follow up visits to maximize her success with intensive lifestyle modifications for her multiple health conditions.  ALLERGIES: Allergies  Allergen Reactions  . Rosuvastatin Other (See Comments)    Muscle Pain in Thighs  . Shellfish Allergy Hives  . Sulfa Antibiotics Nausea Only  . Latex Rash  . Lisinopril Cough    MEDICATIONS: Current Outpatient Medications on File Prior to Visit  Medication Sig Dispense Refill  . aspirin 81 MG chewable tablet Chew by mouth daily.    Maria Marquez co-enzyme Q-10 30 MG capsule Take 1 capsule (30 mg total) by mouth daily.    Maria Marquez EPINEPHrine (EPIPEN 2-PAK) 0.3 mg/0.3 mL IJ SOAJ injection as needed.    Maria Marquez estradiol-norethindrone (ACTIVELLA) 1-0.5 MG tablet Take 1 tablet by mouth daily. 90 tablet 1  . ezetimibe (ZETIA) 10 MG tablet TAKE 1 TABLET EVERY DAY 90 tablet 2  . levothyroxine (SYNTHROID, LEVOTHROID) 137 MCG tablet Take 1 tablet (137 mcg total) by mouth at bedtime. 90 tablet 3  . losartan-hydrochlorothiazide (HYZAAR) 50-12.5 MG tablet TAKE 1 TABLET EVERY DAY 90 tablet 2  . omeprazole (PRILOSEC) 20 MG capsule Take 1 capsule (20 mg total) by mouth daily. 90 capsule 0  . Probiotic Product (PROBIOTIC DAILY PO) Take by mouth.    . simvastatin (ZOCOR)  10 MG tablet Take 1 tablet (10 mg total) by mouth 2 (two) times a week. 90 tablet 1   No current facility-administered medications on file prior to visit.     PAST MEDICAL HISTORY: Past Medical History:  Diagnosis Date  . Acute blood loss anemia   . Arthritis   . Back pain   . Bilious vomiting with nausea   . Constipation   . GERD (gastroesophageal reflux disease)   . HLD (hyperlipidemia)   . Hypertension   . Hypothyroid   . Joint pain   . Leg edema   . Leukocytosis   . Multiple food allergies   . Myalgia due to statin 11/23/2018  . Osteoarthritis of hip    Right  . Postmenopausal hormone replacement therapy 06/17/2015  . Prediabetes   . Sessile colonic polyp 10/04/2017   Colonoscopy 12/2015, Dr. Fuller Plan, repeat q  5 years.    PAST SURGICAL HISTORY: Past Surgical History:  Procedure Laterality Date  . EYE SURGERY  1987  . HERNIA REPAIR    . THYROID LOBECTOMY     right side  . 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 Smoking status: Current Every Day Smoker    Packs/day: 0.25    Types: Cigarettes  . Smokeless tobacco: Current User  Substance Use Topics  . Alcohol use: Yes    Comment: rarely  . Drug use: No    FAMILY HISTORY: Family History  Problem Relation Age of Onset  . Arthritis Mother   . Breast cancer Mother   . Schizophrenia Mother   . Diabetes Father   . Heart disease Father   . Kidney disease Father   . Stroke Sister   . Lung cancer Brother   . Heart disease Brother   . Lung cancer Brother   . Heart disease Brother   . Vision loss Brother        Glaucoma  . Alcohol abuse Brother     ROS: Review of Systems  Gastrointestinal: Negative for nausea and vomiting.  Musculoskeletal:       Negative for muscle weakness.  Psychiatric/Behavioral:  Positive for depression.    PHYSICAL EXAM: Pt in no acute distress  RECENT LABS AND TESTS: BMET    Component Value Date/Time   NA 140 11/01/2018 1306   K 4.6 11/01/2018 1306   CL 102 11/01/2018 1306   CO2 22 11/01/2018 1306   GLUCOSE 107 (H) 11/01/2018 1306   GLUCOSE 123 (H) 01/13/2018 0915   BUN 17 11/01/2018 1306   CREATININE 1.20 (H) 11/01/2018 1306   CALCIUM 9.2 11/01/2018 1306   GFRNONAA 46 (L) 11/01/2018 1306   GFRAA 53 (L) 11/01/2018 1306   Lab Results  Component Value Date   HGBA1C 5.7 (H) 11/01/2018   HGBA1C 5.8 (H) 06/13/2018   HGBA1C 6.4 01/17/2018   HGBA1C 6.1 09/23/2015   Lab Results  Component Value Date   INSULIN 11.7 11/01/2018   INSULIN 15.0 06/13/2018   INSULIN 14.0 02/22/2018   CBC    Component Value Date/Time   WBC 9.0 11/01/2018 1306   WBC 8.0 01/13/2018 0915   RBC 4.79 11/01/2018 1306   RBC 4.62 01/13/2018 0915   HGB 14.5 11/01/2018 1306   HCT 43.0 11/01/2018 1306   PLT 263 02/22/2018 0936   MCV 90 11/01/2018 1306   MCH 30.3 11/01/2018 1306   MCH 30.9 01/30/2016 0405   MCHC 33.7 11/01/2018 1306   MCHC 33.8 01/13/2018 0915   RDW 13.6 11/01/2018 1306   LYMPHSABS 1.7 11/01/2018 1306   MONOABS 0.6 01/13/2018 0915   EOSABS 0.2 11/01/2018 1306   BASOSABS 0.1 11/01/2018 1306   Iron/TIBC/Ferritin/ %Sat No results found for: IRON, TIBC, FERRITIN, IRONPCTSAT Lipid Panel     Component Value Date/Time   CHOL 222 (H) 11/01/2018 1306   TRIG 155 (H) 11/01/2018 1306   HDL 40 11/01/2018 1306   CHOLHDL 4.9 (H) 02/22/2018 0936   CHOLHDL 7 01/13/2018 0915   VLDL 53.0 (H) 01/13/2018 0915   LDLCALC 151 (H) 11/01/2018 1306   LDLDIRECT 197.0 01/13/2018 0915   Hepatic Function Panel     Component Value Date/Time   PROT 6.6 11/01/2018 1306   ALBUMIN  4.2 11/01/2018 1306   AST 13 11/01/2018 1306   ALT 15 11/01/2018 1306   ALKPHOS 82 11/01/2018 1306   BILITOT 0.6 11/01/2018 1306      Component Value Date/Time   TSH 4.190 11/01/2018 1306    TSH 2.010 06/13/2018 0843   TSH 2.350 02/22/2018 0936    Results for NIL, BOLSER "DREYAH MONTROSE" (MRN [id number]) as of 12/18/2018 16:57  Ref. Range 11/01/2018 13:06  Vitamin D, 25-Hydroxy Latest Ref Range: 30.0 - 100.0 ng/mL 42.6    I, Marcille Blanco, CMA, am acting as transcriptionist for Starlyn Skeans, MD I have reviewed the above documentation for accuracy and completeness, and I agree with the above. -[name], MD

## 2019-01-02 ENCOUNTER — Encounter (INDEPENDENT_AMBULATORY_CARE_PROVIDER_SITE_OTHER): Payer: Self-pay | Admitting: Family Medicine

## 2019-01-02 ENCOUNTER — Ambulatory Visit (INDEPENDENT_AMBULATORY_CARE_PROVIDER_SITE_OTHER): Payer: Medicare HMO | Admitting: Family Medicine

## 2019-01-02 ENCOUNTER — Other Ambulatory Visit: Payer: Self-pay

## 2019-01-02 DIAGNOSIS — E559 Vitamin D deficiency, unspecified: Secondary | ICD-10-CM

## 2019-01-02 DIAGNOSIS — Z6835 Body mass index (BMI) 35.0-35.9, adult: Secondary | ICD-10-CM | POA: Diagnosis not present

## 2019-01-02 DIAGNOSIS — E7849 Other hyperlipidemia: Secondary | ICD-10-CM | POA: Diagnosis not present

## 2019-01-02 MED ORDER — VITAMIN D (ERGOCALCIFEROL) 1.25 MG (50000 UNIT) PO CAPS: 50000.0000 [IU] | ORAL_CAPSULE | ORAL | 0 refills | Status: DC

## 2019-01-03 NOTE — Progress Notes (Signed)
Office: [phone]  /  Fax: [phone] TeleHealth Visit:  [name] has verbally consented to this TeleHealth visit today. The patient is located at home, the provider is located at the [facility] office. The participants in this visit include the listed provider and patient. Maria Marquez was unable to use doxy.me and the telehealth visit was conducted via telephone.  HPI:   Chief Complaint: OBESITY Maria Marquez is here to discuss her progress with her obesity treatment plan. She is on the Category 2 plan and is following her eating plan approximately 50 % of the time. She states she is walking around the yard and driveway 15 minutes 3 to 4 times per week. Maria Marquez feels that she has not done as well on her eating plan these last 2 weeks. She feels that she has gained a couple of pounds. Maria Marquez thinks that she is ready to get back on track with her eating.   We were unable to weigh the patient today for this TeleHealth visit. She feels as if she has gained weight since her last visit. She has lost 25 lbs since starting treatment with Maria Marquez.  Vitamin D Deficiency Maria Marquez has a diagnosis of vitamin D deficiency. She is currently on vit D and is slowly improving, but is not yet at goal. Maria Marquez denies nausea, vomiting, or muscle weakness.  Hyperlipidemia Maria Marquez has hyperlipidemia and has been trying to improve her cholesterol levels with intensive lifestyle modification including a low saturated fat diet, exercise, and weight loss. She has questions about fish oil and diet to help reduce cholesterol. She is now on Zetia, Zocor, and COQ10, in which she denies having any problems.  ASSESSMENT AND PLAN:  Vitamin D deficiency - Plan: Vitamin D, Ergocalciferol, (DRISDOL) 1.25 MG (50000 UT) CAPS capsule  Other hyperlipidemia  Class 2 severe obesity with serious comorbidity and body mass index (BMI) of 35.0 to 35.9 in adult, unspecified obesity type ([place])  PLAN:  Vitamin D Deficiency Maria Marquez was  informed that low vitamin D levels contribute to fatigue and are associated with obesity, breast, and colon cancer. Maria Marquez agrees to continue to take prescription Vit D @50 ,000 IU every week #4 with no refills and will follow up for routine testing of vitamin D, at least 2-3 times per year. She was informed of the risk of over-replacement of vitamin D and agrees to not increase her dose unless she discusses this with Maria Marquez first. Maria Marquez agrees to follow up in 2 weeks as directed.  Hyperlipidemia Maria Marquez was informed of the American Heart Association Guidelines emphasizing intensive lifestyle modifications as the first line treatment for hyperlipidemia. We discussed many lifestyle modifications today in depth, and Maria Marquez will continue to work on decreasing saturated fats such as fatty red meat, butter and many fried foods. She will also increase vegetables and lean protein in her diet and continue to work on exercise and weight loss efforts. Maria Marquez agrees to continue her statin and COQ10. She is okay to take OTC Fish oil also. Maria Marquez agrees to follow up at the agreed upon time in 2 weeks.  Obesity Maria Marquez is currently in the action stage of change. As such, her goal is to continue with weight loss efforts. She has agreed to follow the Category 2 plan. Maria Marquez has been instructed to work up to a goal of 150 minutes of combined cardio and strengthening exercise per week for weight loss and overall health benefits. We discussed the following Behavioral Modification Strategies today: increasing lean protein intake, keeping  healthy foods in the home, and work on meal planning and easy cooking plans.  Maria Marquez has agreed to follow up with our clinic in 2 weeks. She was informed of the importance of frequent follow up visits to maximize her success with intensive lifestyle modifications for her multiple health conditions.  ALLERGIES: Allergies  Allergen Reactions  . Rosuvastatin Other (See Comments)    Muscle Pain in  Thighs  . Shellfish Allergy Hives  . Sulfa Antibiotics Nausea Only  . Latex Rash  . Lisinopril Cough    MEDICATIONS: Current Outpatient Medications on File Prior to Visit  Medication Sig Dispense Refill  . aspirin 81 MG chewable tablet Chew by mouth daily.    [name] buPROPion (WELLBUTRIN SR) 200 MG 12 hr tablet Take 1 tablet (200 mg total) by mouth daily. 30 tablet 0  . co-enzyme Q-10 30 MG capsule Take 1 capsule (30 mg total) by mouth daily.    [name] EPINEPHrine (EPIPEN 2-PAK) 0.3 mg/0.3 mL IJ SOAJ injection as needed.    [name] estradiol-norethindrone (ACTIVELLA) 1-0.5 MG tablet Take 1 tablet by mouth daily. 90 tablet 1  . ezetimibe (ZETIA) 10 MG tablet TAKE 1 TABLET EVERY DAY 90 tablet 2  . levothyroxine (SYNTHROID, LEVOTHROID) 137 MCG tablet Take 1 tablet (137 mcg total) by mouth at bedtime. 90 tablet 3  . losartan-hydrochlorothiazide (HYZAAR) 50-12.5 MG tablet TAKE 1 TABLET EVERY DAY 90 tablet 2  . omeprazole (PRILOSEC) 20 MG capsule Take 1 capsule (20 mg total) by mouth daily. 90 capsule 0  . Probiotic Product (PROBIOTIC DAILY PO) Take by mouth.    . simvastatin (ZOCOR) 10 MG tablet Take 1 tablet (10 mg total) by mouth 2 (two) times a week. 90 tablet 1   No current facility-administered medications on file prior to visit.     PAST MEDICAL HISTORY: Past Medical History:  Diagnosis Date  . Acute blood loss anemia   . Arthritis   . Back pain   . Bilious vomiting with nausea   . Constipation   . GERD (gastroesophageal reflux disease)   . HLD (hyperlipidemia)   . Hypertension   . Hypothyroid   . Joint pain   . Leg edema   . Leukocytosis   . Multiple food allergies   . Myalgia due to statin 11/23/2018  . Osteoarthritis of hip    Right  . Postmenopausal hormone replacement therapy 06/17/2015  . Prediabetes   . Sessile colonic polyp 10/04/2017   Colonoscopy 12/2015, Dr. Fuller Plan, repeat q 5 years.    PAST SURGICAL HISTORY: Past Surgical History:  Procedure Laterality Date  . EYE SURGERY   1987  . HERNIA REPAIR    . THYROID LOBECTOMY     right side  . 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 Smoking status: Current Every Day Smoker    Packs/day: 0.25    Types: Cigarettes  . Smokeless tobacco: Current User  Substance Use Topics  . Alcohol use: Yes    Comment: rarely  . Drug use: No    FAMILY HISTORY: Family History  Problem Relation Age of Onset  . Arthritis Mother   . Breast cancer Mother   . Schizophrenia Mother   . Diabetes Father   . Heart disease Father   . Kidney disease Father   . Stroke  Sister   . Lung cancer Brother   . Heart disease Brother   . Lung cancer Brother   . Heart disease Brother   . Vision loss Brother        Glaucoma  . Alcohol abuse Brother     ROS: Review of Systems  Gastrointestinal: Negative for nausea and vomiting.  Musculoskeletal:       Negative for muscle weakness.    PHYSICAL EXAM: Pt in no acute distress  RECENT LABS AND TESTS: BMET    Component Value Date/Time   NA 140 11/01/2018 1306   K 4.6 11/01/2018 1306   CL 102 11/01/2018 1306   CO2 22 11/01/2018 1306   GLUCOSE 107 (H) 11/01/2018 1306   GLUCOSE 123 (H) 01/13/2018 0915   BUN 17 11/01/2018 1306   CREATININE 1.20 (H) 11/01/2018 1306   CALCIUM 9.2 11/01/2018 1306   GFRNONAA 46 (L) 11/01/2018 1306   GFRAA 53 (L) 11/01/2018 1306   Lab Results  Component Value Date   HGBA1C 5.7 (H) 11/01/2018   HGBA1C 5.8 (H) 06/13/2018   HGBA1C 6.4 01/17/2018   HGBA1C 6.1 09/23/2015   Lab Results  Component Value Date   INSULIN 11.7 11/01/2018   INSULIN 15.0 06/13/2018   INSULIN 14.0 02/22/2018   CBC    Component Value Date/Time   WBC 9.0 11/01/2018 1306   WBC 8.0 01/13/2018 0915   RBC 4.79 11/01/2018 1306   RBC 4.62 01/13/2018 0915   HGB 14.5 11/01/2018  1306   HCT 43.0 11/01/2018 1306   PLT 263 02/22/2018 0936   MCV 90 11/01/2018 1306   MCH 30.3 11/01/2018 1306   MCH 30.9 01/30/2016 0405   MCHC 33.7 11/01/2018 1306   MCHC 33.8 01/13/2018 0915   RDW 13.6 11/01/2018 1306   LYMPHSABS 1.7 11/01/2018 1306   MONOABS 0.6 01/13/2018 0915   EOSABS 0.2 11/01/2018 1306   BASOSABS 0.1 11/01/2018 1306   Iron/TIBC/Ferritin/ %Sat No results found for: IRON, TIBC, FERRITIN, IRONPCTSAT Lipid Panel     Component Value Date/Time   CHOL 222 (H) 11/01/2018 1306   TRIG 155 (H) 11/01/2018 1306   HDL 40 11/01/2018 1306   CHOLHDL 4.9 (H) 02/22/2018 0936   CHOLHDL 7 01/13/2018 0915   VLDL 53.0 (H) 01/13/2018 0915   LDLCALC 151 (H) 11/01/2018 1306   LDLDIRECT 197.0 01/13/2018 0915   Hepatic Function Panel     Component Value Date/Time   PROT 6.6 11/01/2018 1306   ALBUMIN 4.2 11/01/2018 1306   AST 13 11/01/2018 1306   ALT 15 11/01/2018 1306   ALKPHOS 82 11/01/2018 1306   BILITOT 0.6 11/01/2018 1306      Component Value Date/Time   TSH 4.190 11/01/2018 1306   TSH 2.010 06/13/2018 0843   TSH 2.350 02/22/2018 0936   Results for BERNISE, SYLVAIN "CYNDAL KASSON" (MRN [id number]) as of 01/03/2019 06:55  Ref. Range 11/01/2018 13:06  Vitamin D, 25-Hydroxy Latest Ref Range: 30.0 - 100.0 ng/mL 42.6    I, Marcille Blanco, CMA, am acting as transcriptionist for Starlyn Skeans, MD I have reviewed the above documentation for accuracy and completeness, and I agree with the above. -[name], MD

## 2019-01-08 ENCOUNTER — Other Ambulatory Visit (INDEPENDENT_AMBULATORY_CARE_PROVIDER_SITE_OTHER): Payer: Self-pay | Admitting: Family Medicine

## 2019-01-08 DIAGNOSIS — E559 Vitamin D deficiency, unspecified: Secondary | ICD-10-CM

## 2019-01-10 ENCOUNTER — Other Ambulatory Visit (INDEPENDENT_AMBULATORY_CARE_PROVIDER_SITE_OTHER): Payer: Self-pay | Admitting: Family Medicine

## 2019-01-10 DIAGNOSIS — F3289 Other specified depressive episodes: Secondary | ICD-10-CM

## 2019-01-12 ENCOUNTER — Other Ambulatory Visit (INDEPENDENT_AMBULATORY_CARE_PROVIDER_SITE_OTHER): Payer: Self-pay | Admitting: Family Medicine

## 2019-01-12 DIAGNOSIS — F3289 Other specified depressive episodes: Secondary | ICD-10-CM

## 2019-01-17 ENCOUNTER — Other Ambulatory Visit: Payer: Self-pay

## 2019-01-17 ENCOUNTER — Ambulatory Visit (INDEPENDENT_AMBULATORY_CARE_PROVIDER_SITE_OTHER): Payer: Medicare HMO | Admitting: Family Medicine

## 2019-01-17 ENCOUNTER — Encounter (INDEPENDENT_AMBULATORY_CARE_PROVIDER_SITE_OTHER): Payer: Self-pay | Admitting: Family Medicine

## 2019-01-17 DIAGNOSIS — E559 Vitamin D deficiency, unspecified: Secondary | ICD-10-CM

## 2019-01-17 DIAGNOSIS — Z6835 Body mass index (BMI) 35.0-35.9, adult: Secondary | ICD-10-CM

## 2019-01-17 DIAGNOSIS — F3289 Other specified depressive episodes: Secondary | ICD-10-CM

## 2019-01-17 MED ORDER — BUPROPION HCL ER (SR) 200 MG PO TB12: 200.0000 mg | ORAL_TABLET | Freq: Every day | ORAL | 0 refills | Status: DC

## 2019-01-17 MED ORDER — VITAMIN D (ERGOCALCIFEROL) 1.25 MG (50000 UNIT) PO CAPS: 50000.0000 [IU] | ORAL_CAPSULE | ORAL | 0 refills | Status: DC

## 2019-01-18 NOTE — Progress Notes (Signed)
Office: [phone]  /  Fax: [phone] TeleHealth Visit:  Maria Marquez has verbally consented to this TeleHealth visit today. The patient is located at home, the provider is located at the [employer] [facility] office. The participants in this visit include the listed provider and patient. The visit was conducted today via Face Time.  HPI:   Chief Complaint: OBESITY Maria Marquez is here to discuss her progress with her obesity treatment plan. She is on the Category 2 plan and is following her eating plan approximately 50 % of the time. She states she is walking 10 to 15 minutes 2 to 3 times per week. Maria Marquez has done well maintaining her weight. She is struggling with the [place], but is trying to keep herself occupied. She states her that her hunger is controlled and she is not struggling to find groceries. Her grown children are making sure that she has everything that she needs.  We were unable to weigh the patient today for this TeleHealth visit. She feels as if she did not lose weight since her last visit. She has lost 25 lbs since starting treatment with [name].  Vitamin D Deficiency Maria Marquez has a diagnosis of vitamin D deficiency. She is currently stable on vit D, but is not yet at goal. Maria Marquez denies nausea, vomiting, or muscle weakness.  Depression with emotional eating behaviors Maria Marquez mood is stable and she is sleeping well. She is frustrated with social isolation, but is otherwise doing well. She is struggling with emotional eating and using food for comfort to the extent that it is negatively impacting her health. She often snacks when she is not hungry. Maria Marquez sometimes feels she is out of control and then feels guilty that she made poor food choices. She has been working on behavior modification techniques to help reduce her emotional eating and has been somewhat successful.   Depression screen [facility] 2/9 11/23/2018 02/22/2018 12/12/2017 10/04/2017  Decreased Interest 0 3 3 0    Down, Depressed, Hopeless 0 3 2 0  PHQ - 2 Score 0 6 5 0  Altered sleeping - 2 3 -  Tired, decreased energy - 2 2 -  Change in appetite - 2 2 -  Feeling bad or failure about yourself  - 1 0 -  Trouble concentrating - 0 1 -  Moving slowly or fidgety/restless - 0 0 -  Suicidal thoughts - 0 0 -  PHQ-9 Score - 13 13 -  Difficult doing work/chores - Somewhat difficult Somewhat difficult -   ASSESSMENT AND PLAN:  Vitamin D deficiency - Plan: Vitamin D, Ergocalciferol, (DRISDOL) 1.25 MG (50000 UT) CAPS capsule  Other depression - with emotional eating - Plan: buPROPion (WELLBUTRIN SR) 200 MG 12 hr tablet  Class 2 severe obesity with serious comorbidity and body mass index (BMI) of 35.0 to 35.9 in adult, unspecified obesity type (HCC)  PLAN:  Vitamin D Deficiency Maria Marquez was informed that low vitamin D levels contribute to fatigue and are associated with obesity, breast, and colon cancer. [name] agrees to continue to take prescription Vit D @50 ,000 IU every week #4 with no refills and will follow up for routine testing of vitamin D, at least 2-3 times per year. She was informed of the risk of over-replacement of vitamin D and agrees to not increase her dose unless she discusses this with [name] first. [name] agrees to follow up in 2 weeks as directed.  Depression with Emotional Eating Behaviors We discussed behavior modification techniques today to  help Maria Marquez deal with her emotional eating and depression. She has agreed to take Wellbutrin SR 200 mg qd #30 with no refills and agreed to follow up as directed.  Obesity Maria Marquez is currently in the action stage of change. As such, her goal is to continue with weight loss efforts. She has agreed to follow the Category 2 plan. Maria Marquez has been instructed to continue walking 2 to 3 times per week. We discussed the following Behavioral Modification Strategies today: increasing lean protein intake, work on meal planning, no skipping meals, and easy cooking  plans and emotional eating strategies.  Maria Marquez has agreed to follow up with our clinic in 2 weeks. She was informed of the importance of frequent follow up visits to maximize her success with intensive lifestyle modifications for her multiple health conditions.  ALLERGIES: Allergies  Allergen Reactions   Rosuvastatin Other (See Comments)    Muscle Pain in Thighs   Shellfish Allergy Hives   Sulfa Antibiotics Nausea Only   Latex Rash   Lisinopril Cough    MEDICATIONS: Current Outpatient Medications on File Prior to Visit  Medication Sig Dispense Refill   aspirin 81 MG chewable tablet Chew by mouth daily.     co-enzyme Q-10 30 MG capsule Take 1 capsule (30 mg total) by mouth daily.     EPINEPHrine (EPIPEN 2-PAK) 0.3 mg/0.3 mL IJ SOAJ injection as needed.     estradiol-norethindrone (ACTIVELLA) 1-0.5 MG tablet Take 1 tablet by mouth daily. 90 tablet 1   ezetimibe (ZETIA) 10 MG tablet TAKE 1 TABLET EVERY DAY 90 tablet 2   levothyroxine (SYNTHROID, LEVOTHROID) 137 MCG tablet Take 1 tablet (137 mcg total) by mouth at bedtime. 90 tablet 3   losartan-hydrochlorothiazide (HYZAAR) 50-12.5 MG tablet TAKE 1 TABLET EVERY DAY 90 tablet 2   omeprazole (PRILOSEC) 20 MG capsule Take 1 capsule (20 mg total) by mouth daily. 90 capsule 0   Probiotic Product (PROBIOTIC DAILY PO) Take by mouth.     simvastatin (ZOCOR) 10 MG tablet Take 1 tablet (10 mg total) by mouth 2 (two) times a week. 90 tablet 1   No current facility-administered medications on file prior to visit.     PAST MEDICAL HISTORY: Past Medical History:  Diagnosis Date   Acute blood loss anemia    Arthritis    Back pain    Bilious vomiting with nausea    Constipation    GERD (gastroesophageal reflux disease)    HLD (hyperlipidemia)    Hypertension    Hypothyroid    Joint pain    Leg edema    Leukocytosis    Multiple food allergies    Myalgia due to statin 11/23/2018   Osteoarthritis of hip    Right    Postmenopausal hormone replacement therapy 06/17/2015   Prediabetes    Sessile colonic polyp 10/04/2017   Colonoscopy 12/2015, Dr. Fuller Plan, repeat q 5 years.    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/2016   Procedure: RIGHT TOTAL HIP ARTHROPLASTY ANTERIOR APPROACH;  Surgeon: [name], MD;  Location: [facility];  Service: Orthopedics;  Laterality: Right;    SOCIAL HISTORY: Social History   Tobacco Use   Smoking status: Current Every Day Smoker    Packs/day: 0.25    Types: Cigarettes   Smokeless tobacco: Current User  Substance Use  Topics   Alcohol use: Yes    Comment: rarely   Drug use: No    FAMILY HISTORY: Family History  Problem Relation Age of Onset   Arthritis Mother    Breast cancer Mother    Schizophrenia Mother    Diabetes Father    Heart disease Father    Kidney disease Father    Stroke Sister    Lung cancer Brother    Heart disease Brother    Lung cancer Brother    Heart disease Brother    Vision loss Brother        Glaucoma   Alcohol abuse Brother     ROS: Review of Systems  Gastrointestinal: Negative for nausea and vomiting.  Musculoskeletal:       Negative for muscle weakness.  Psychiatric/Behavioral: Positive for depression.    PHYSICAL EXAM: Pt in no acute distress  RECENT LABS AND TESTS: BMET    Component Value Date/Time   NA 140 11/01/2018 1306   K 4.6 11/01/2018 1306   CL 102 11/01/2018 1306   CO2 22 11/01/2018 1306   GLUCOSE 107 (H) 11/01/2018 1306   GLUCOSE 123 (H) 01/13/2018 0915   BUN 17 11/01/2018 1306   CREATININE 1.20 (H) 11/01/2018 1306   CALCIUM 9.2 11/01/2018 1306   GFRNONAA 46 (L) 11/01/2018 1306   GFRAA 53 (L) 11/01/2018 1306   Lab Results  Component Value Date   HGBA1C 5.7 (H) 11/01/2018   HGBA1C 5.8 (H) 06/13/2018   HGBA1C 6.4 01/17/2018    HGBA1C 6.1 09/23/2015   Lab Results  Component Value Date   INSULIN 11.7 11/01/2018   INSULIN 15.0 06/13/2018   INSULIN 14.0 02/22/2018   CBC    Component Value Date/Time   WBC 9.0 11/01/2018 1306   WBC 8.0 01/13/2018 0915   RBC 4.79 11/01/2018 1306   RBC 4.62 01/13/2018 0915   HGB 14.5 11/01/2018 1306   HCT 43.0 11/01/2018 1306   PLT 263 02/22/2018 0936   MCV 90 11/01/2018 1306   MCH 30.3 11/01/2018 1306   MCH 30.9 01/30/2016 0405   MCHC 33.7 11/01/2018 1306   MCHC 33.8 01/13/2018 0915   RDW 13.6 11/01/2018 1306   LYMPHSABS 1.7 11/01/2018 1306   MONOABS 0.6 01/13/2018 0915   EOSABS 0.2 11/01/2018 1306   BASOSABS 0.1 11/01/2018 1306   Iron/TIBC/Ferritin/ %Sat No results found for: IRON, TIBC, FERRITIN, IRONPCTSAT Lipid Panel     Component Value Date/Time   CHOL 222 (H) 11/01/2018 1306   TRIG 155 (H) 11/01/2018 1306   HDL 40 11/01/2018 1306   CHOLHDL 4.9 (H) 02/22/2018 0936   CHOLHDL 7 01/13/2018 0915   VLDL 53.0 (H) 01/13/2018 0915   LDLCALC 151 (H) 11/01/2018 1306   LDLDIRECT 197.0 01/13/2018 0915   Hepatic Function Panel     Component Value Date/Time   PROT 6.6 11/01/2018 1306   ALBUMIN 4.2 11/01/2018 1306   AST 13 11/01/2018 1306   ALT 15 11/01/2018 1306   ALKPHOS 82 11/01/2018 1306   BILITOT 0.6 11/01/2018 1306      Component Value Date/Time   TSH 4.190 11/01/2018 1306   TSH 2.010 06/13/2018 0843   TSH 2.350 02/22/2018 0936   Results for ELLYCE, LAFEVERS "HESSIE VARONE" (MRN [id number]) as of 01/18/2019 07:54  Ref. Range 11/01/2018 13:06  Vitamin D, 25-Hydroxy Latest Ref Range: 30.0 - 100.0 ng/mL 42.6     I, Marcille Blanco, CMA, am acting as transcriptionist for Starlyn Skeans, MD I have reviewed the  above documentation for accuracy and completeness, and I agree with the above. -[name], MD

## 2019-01-31 ENCOUNTER — Encounter (INDEPENDENT_AMBULATORY_CARE_PROVIDER_SITE_OTHER): Payer: Self-pay | Admitting: Family Medicine

## 2019-01-31 ENCOUNTER — Ambulatory Visit (INDEPENDENT_AMBULATORY_CARE_PROVIDER_SITE_OTHER): Payer: Medicare HMO | Admitting: Family Medicine

## 2019-01-31 ENCOUNTER — Other Ambulatory Visit: Payer: Self-pay

## 2019-01-31 ENCOUNTER — Other Ambulatory Visit: Payer: Self-pay | Admitting: Family Medicine

## 2019-01-31 DIAGNOSIS — E559 Vitamin D deficiency, unspecified: Secondary | ICD-10-CM

## 2019-01-31 DIAGNOSIS — Z6835 Body mass index (BMI) 35.0-35.9, adult: Secondary | ICD-10-CM

## 2019-01-31 DIAGNOSIS — F3289 Other specified depressive episodes: Secondary | ICD-10-CM | POA: Diagnosis not present

## 2019-01-31 MED ORDER — BUPROPION HCL ER (SR) 150 MG PO TB12: 150.0000 mg | ORAL_TABLET | Freq: Two times a day (BID) | ORAL | 0 refills | Status: DC

## 2019-01-31 MED ORDER — VITAMIN D (ERGOCALCIFEROL) 1.25 MG (50000 UNIT) PO CAPS: 50000.0000 [IU] | ORAL_CAPSULE | ORAL | 0 refills | Status: DC

## 2019-02-01 ENCOUNTER — Encounter (INDEPENDENT_AMBULATORY_CARE_PROVIDER_SITE_OTHER): Payer: Self-pay | Admitting: Family Medicine

## 2019-02-01 MED ORDER — BUPROPION HCL ER (SR) 150 MG PO TB12: 150.0000 mg | ORAL_TABLET | Freq: Two times a day (BID) | ORAL | 0 refills | Status: DC

## 2019-02-05 NOTE — Progress Notes (Signed)
Office: [phone]  /  Fax: [phone] TeleHealth Visit:  Maria Marquez has verbally consented to this TeleHealth visit today. The patient is located at home, the provider is located at the [employer] [facility] office. The participants in this visit include the listed provider and patient. The visit was conducted today via Face Time.  HPI:   Chief Complaint: OBESITY Maria Marquez is here to discuss her progress with her obesity treatment plan. She is on the Category 2 plan and is following her eating plan approximately 60 % of the time. She states she is dancing and doing aerobics 10 minutes 2 to 3 times per week. Maria Marquez states that she is struggling to follow her plan. She feels that she has maintained her weight, but has been feeling lonely and discouraged due to [place].  We were unable to weigh the patient today for this TeleHealth visit. She feels as if she has gained weight since her last visit. She has lost 25 lbs since starting treatment with [name].  Vitamin D Deficiency Maria Marquez has a diagnosis of vitamin D deficiency. She is currently stable on vit D. Maria Marquez denies nausea, vomiting, or muscle weakness.  Depression with emotional eating behaviors Maria Marquez is feeling isolated and has a decreased mood. She is not sleeping well and is a bit tearful during the conversation. She is trying to eat healthy, but her motivation has decreased as well as her mood. She is struggling with emotional eating and using food for comfort to the extent that it is negatively impacting her health. She often snacks when she is not hungry. Maria Marquez sometimes feels she is out of control and then feels guilty that she made poor food choices. She has been working on behavior modification techniques to help reduce her emotional eating and has been somewhat successful. She shows no sign of suicidal or homicidal ideations.  Depression screen [facility] 2/9 11/23/2018 02/22/2018 12/12/2017 10/04/2017  Decreased Interest 0 3 3 0  Down,  Depressed, Hopeless 0 3 2 0  PHQ - 2 Score 0 6 5 0  Altered sleeping - 2 3 -  Tired, decreased energy - 2 2 -  Change in appetite - 2 2 -  Feeling bad or failure about yourself  - 1 0 -  Trouble concentrating - 0 1 -  Moving slowly or fidgety/restless - 0 0 -  Suicidal thoughts - 0 0 -  PHQ-9 Score - 13 13 -  Difficult doing work/chores - Somewhat difficult Somewhat difficult -   ASSESSMENT AND PLAN:  Vitamin D deficiency - Plan: Vitamin D, Ergocalciferol, (DRISDOL) 1.25 MG (50000 UT) CAPS capsule  Other depression - with emotional eating - Plan: buPROPion (WELLBUTRIN SR) 150 MG 12 hr tablet, DISCONTINUED: buPROPion (WELLBUTRIN SR) 150 MG 12 hr tablet  Class 2 severe obesity with serious comorbidity and body mass index (BMI) of 35.0 to 35.9 in adult, unspecified obesity type (HCC)  PLAN:  Vitamin D Deficiency Maria Marquez was informed that low vitamin D levels contribute to fatigue and are associated with obesity, breast, and colon cancer. Maria Marquez agrees to continue to take prescription Vit D @50 ,000 IU every week #4 with no refills and will follow up for routine testing of vitamin D, at least 2-3 times per year. She was informed of the risk of over-replacement of vitamin D and agrees to not increase her dose unless she discusses this with [name] first. Maria Marquez agrees to follow up in 2 weeks as directed.  Depression with Emotional Eating Behaviors We discussed  behavior modification techniques today to help Maria Marquez deal with her emotional eating and depression. She has agreed to increase Wellbutrin SR 150 mg BID #60 with no refills and she agreed to follow up as directed in 2 weeks.  Obesity Maria Marquez is currently in the action stage of change. As such, her goal is to maintain weight for now while she works on her depression and then get back to weight loss. She has agreed to follow the Category 2 plan. Maria Marquez has been instructed to work up to a goal of 150 minutes of combined cardio and strengthening  exercise per week for weight loss and overall health benefits. We discussed the following Behavioral Modification Strategies today: work on meal planning and easy cooking plans, emotional eating strategies, keeping healthy foods in the home, and ways to avoid boredom eating.  Maria Marquez has agreed to follow up with our clinic in 2 weeks. She was informed of the importance of frequent follow up visits to maximize her success with intensive lifestyle modifications for her multiple health conditions.  ALLERGIES: Allergies  Allergen Reactions  . Rosuvastatin Other (See Comments)    Muscle Pain in Thighs  . Shellfish Allergy Hives  . Sulfa Antibiotics Nausea Only  . Latex Rash  . Lisinopril Cough    MEDICATIONS: Current Outpatient Medications on File Prior to Visit  Medication Sig Dispense Refill  . aspirin 81 MG chewable tablet Chew by mouth daily.    [name] co-enzyme Q-10 30 MG capsule Take 1 capsule (30 mg total) by mouth daily.    [name] EPINEPHrine (EPIPEN 2-PAK) 0.3 mg/0.3 mL IJ SOAJ injection as needed.    [name] estradiol-norethindrone (ACTIVELLA) 1-0.5 MG tablet Take 1 tablet by mouth daily. 90 tablet 1  . ezetimibe (ZETIA) 10 MG tablet TAKE 1 TABLET EVERY DAY 90 tablet 2  . levothyroxine (SYNTHROID, LEVOTHROID) 137 MCG tablet Take 1 tablet (137 mcg total) by mouth at bedtime. 90 tablet 3  . losartan-hydrochlorothiazide (HYZAAR) 50-12.5 MG tablet TAKE 1 TABLET EVERY DAY 90 tablet 2  . Probiotic Product (PROBIOTIC DAILY PO) Take by mouth.    . simvastatin (ZOCOR) 10 MG tablet Take 1 tablet (10 mg total) by mouth 2 (two) times a week. 90 tablet 1   No current facility-administered medications on file prior to visit.     PAST MEDICAL HISTORY: Past Medical History:  Diagnosis Date  . Acute blood loss anemia   . Arthritis   . Back pain   . Bilious vomiting with nausea   . Constipation   . GERD (gastroesophageal reflux disease)   . HLD (hyperlipidemia)   . Hypertension   . Hypothyroid   . Joint  pain   . Leg edema   . Leukocytosis   . Multiple food allergies   . Myalgia due to statin 11/23/2018  . Osteoarthritis of hip    Right  . Postmenopausal hormone replacement therapy 06/17/2015  . Prediabetes   . Sessile colonic polyp 10/04/2017   Colonoscopy 12/2015, Dr. Fuller Plan, repeat q 5 years.    PAST SURGICAL HISTORY: Past Surgical History:  Procedure Laterality Date  . EYE SURGERY  1987  . HERNIA REPAIR    . THYROID LOBECTOMY     right side  . 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 Smoking status: Current Every  Day Smoker    Packs/day: 0.25    Types: Cigarettes  . Smokeless tobacco: Current User  Substance Use Topics  . Alcohol use: Yes    Comment: rarely  . Drug use: No    FAMILY HISTORY: Family History  Problem Relation Age of Onset  . Arthritis Mother   . Breast cancer Mother   . Schizophrenia Mother   . Diabetes Father   . Heart disease Father   . Kidney disease Father   . Stroke Sister   . Lung cancer Brother   . Heart disease Brother   . Lung cancer Brother   . Heart disease Brother   . Vision loss Brother        Glaucoma  . Alcohol abuse Brother     ROS: Review of Systems  Gastrointestinal: Negative for nausea and vomiting.  Musculoskeletal:       Negative for muscle weakness.  Psychiatric/Behavioral: Positive for depression. Negative for suicidal ideas.       Negative for homicidal ideations.    PHYSICAL EXAM: Pt in no acute distress  RECENT LABS AND TESTS: BMET    Component Value Date/Time   NA 140 11/01/2018 1306   K 4.6 11/01/2018 1306   CL 102 11/01/2018 1306   CO2 22 11/01/2018 1306   GLUCOSE 107 (H) 11/01/2018 1306   GLUCOSE 123 (H) 01/13/2018 0915   BUN 17 11/01/2018 1306   CREATININE 1.20 (H) 11/01/2018 1306   CALCIUM 9.2  11/01/2018 1306   GFRNONAA 46 (L) 11/01/2018 1306   GFRAA 53 (L) 11/01/2018 1306   Lab Results  Component Value Date   HGBA1C 5.7 (H) 11/01/2018   HGBA1C 5.8 (H) 06/13/2018   HGBA1C 6.4 01/17/2018   HGBA1C 6.1 09/23/2015   Lab Results  Component Value Date   INSULIN 11.7 11/01/2018   INSULIN 15.0 06/13/2018   INSULIN 14.0 02/22/2018   CBC    Component Value Date/Time   WBC 9.0 11/01/2018 1306   WBC 8.0 01/13/2018 0915   RBC 4.79 11/01/2018 1306   RBC 4.62 01/13/2018 0915   HGB 14.5 11/01/2018 1306   HCT 43.0 11/01/2018 1306   PLT 263 02/22/2018 0936   MCV 90 11/01/2018 1306   MCH 30.3 11/01/2018 1306   MCH 30.9 01/30/2016 0405   MCHC 33.7 11/01/2018 1306   MCHC 33.8 01/13/2018 0915   RDW 13.6 11/01/2018 1306   LYMPHSABS 1.7 11/01/2018 1306   MONOABS 0.6 01/13/2018 0915   EOSABS 0.2 11/01/2018 1306   BASOSABS 0.1 11/01/2018 1306   Iron/TIBC/Ferritin/ %Sat No results found for: IRON, TIBC, FERRITIN, IRONPCTSAT Lipid Panel     Component Value Date/Time   CHOL 222 (H) 11/01/2018 1306   TRIG 155 (H) 11/01/2018 1306   HDL 40 11/01/2018 1306   CHOLHDL 4.9 (H) 02/22/2018 0936   CHOLHDL 7 01/13/2018 0915   VLDL 53.0 (H) 01/13/2018 0915   LDLCALC 151 (H) 11/01/2018 1306   LDLDIRECT 197.0 01/13/2018 0915   Hepatic Function Panel     Component Value Date/Time   PROT 6.6 11/01/2018 1306   ALBUMIN 4.2 11/01/2018 1306   AST 13 11/01/2018 1306   ALT 15 11/01/2018 1306   ALKPHOS 82 11/01/2018 1306   BILITOT 0.6 11/01/2018 1306      Component Value Date/Time   TSH 4.190 11/01/2018 1306   TSH 2.010 06/13/2018 0843   TSH 2.350 02/22/2018 0936    Results for LEXI, CONATY "AYSSA BENTIVEGNA" (MRN [id number]) as of 02/05/2019 16:30  Ref.  Range 11/01/2018 13:06  Vitamin D, 25-Hydroxy Latest Ref Range: 30.0 - 100.0 ng/mL 42.6    I, Marcille Blanco, CMA, am acting as transcriptionist for Starlyn Skeans, MD I have reviewed the above documentation for accuracy and  completeness, and I agree with the above. -[name], MD

## 2019-02-09 ENCOUNTER — Other Ambulatory Visit (INDEPENDENT_AMBULATORY_CARE_PROVIDER_SITE_OTHER): Payer: Self-pay | Admitting: Family Medicine

## 2019-02-09 DIAGNOSIS — E559 Vitamin D deficiency, unspecified: Secondary | ICD-10-CM

## 2019-02-14 ENCOUNTER — Other Ambulatory Visit: Payer: Self-pay

## 2019-02-14 ENCOUNTER — Encounter (INDEPENDENT_AMBULATORY_CARE_PROVIDER_SITE_OTHER): Payer: Self-pay | Admitting: Family Medicine

## 2019-02-14 ENCOUNTER — Telehealth (INDEPENDENT_AMBULATORY_CARE_PROVIDER_SITE_OTHER): Payer: Medicare HMO | Admitting: Family Medicine

## 2019-02-14 DIAGNOSIS — F3289 Other specified depressive episodes: Secondary | ICD-10-CM

## 2019-02-14 DIAGNOSIS — E559 Vitamin D deficiency, unspecified: Secondary | ICD-10-CM | POA: Diagnosis not present

## 2019-02-14 DIAGNOSIS — Z6835 Body mass index (BMI) 35.0-35.9, adult: Secondary | ICD-10-CM

## 2019-02-14 MED ORDER — VITAMIN D (ERGOCALCIFEROL) 1.25 MG (50000 UNIT) PO CAPS: 50000.0000 [IU] | ORAL_CAPSULE | ORAL | 0 refills | Status: DC

## 2019-02-14 MED ORDER — BUPROPION HCL ER (SR) 150 MG PO TB12: 150.0000 mg | ORAL_TABLET | Freq: Two times a day (BID) | ORAL | 0 refills | Status: DC

## 2019-02-15 NOTE — Progress Notes (Signed)
Office: [phone]  /  Fax: [phone] TeleHealth Visit:  Maria Marquez has verbally consented to this TeleHealth visit today. The patient is located at home, the provider is located at the [employer] [facility] office. The participants in this visit include the listed provider and patient. The visit was conducted today via Face Time.  HPI:   Chief Complaint: OBESITY Maria Marquez is here to discuss her progress with her obesity treatment plan. She is on the Category 2 plan and is following her eating plan approximately 50 % of the time. She states she is walking 10 minutes every other day. Maria Marquez continues to work on maintaining her weight. She is struggling with social isolation and boredom, but is mindful of her stress eating. Maria Marquez is trying to walk about 10 to 15 minutes 3 to 4 times a week for exercise and is considering growing some vegetables this summer.  We were unable to weigh the patient today for this TeleHealth visit. She feels as if she has maintained weight since her last visit. She has lost 25 lbs since starting treatment with [name].  Vitamin D Deficiency Maria Marquez has a diagnosis of vitamin D deficiency. She is currently stable on vit D, but is not yet at goal. Maria Marquez denies nausea, vomiting, or muscle weakness.  Depression with emotional eating behaviors Maria Marquez increased her Wellbutrin to 150 mg BID at her last visit. She states that she feels the same, but she seems to be in a better mood. She is not tearful and has not cried recently. Maria Marquez is still feeling tired, but feels this is related to her boredom. She has been working on behavior modification techniques to help reduce her emotional eating and has been somewhat successful.   Depression screen Maria Marquez 2/9 11/23/2018 02/22/2018 12/12/2017 10/04/2017  Decreased Interest 0 3 3 0  Down, Depressed, Hopeless 0 3 2 0  PHQ - 2 Score 0 6 5 0  Altered sleeping - 2 3 -  Tired, decreased energy - 2 2 -  Change in appetite - 2 2 -  Feeling  bad or failure about yourself  - 1 0 -  Trouble concentrating - 0 1 -  Moving slowly or fidgety/restless - 0 0 -  Suicidal thoughts - 0 0 -  PHQ-9 Score - 13 13 -  Difficult doing work/chores - Somewhat difficult Somewhat difficult -   ASSESSMENT AND PLAN:  Vitamin D deficiency - Plan: Vitamin D, Ergocalciferol, (DRISDOL) 1.25 MG (50000 UT) CAPS capsule  Other depression - with emotional eating - Plan: buPROPion (WELLBUTRIN SR) 150 MG 12 hr tablet  Class 2 severe obesity with serious comorbidity and body mass index (BMI) of 35.0 to 35.9 in adult, unspecified obesity type (HCC)  PLAN:  Vitamin D Deficiency Maria Marquez was informed that low vitamin D levels contribute to fatigue and are associated with obesity, breast, and colon cancer. Maria Marquez agrees to continue to take prescription Vit D @50 ,000 IU every week #4 with no refills and will follow up for routine testing of vitamin D, at least 2-3 times per year. She was informed of the risk of over-replacement of vitamin D and agrees to not increase her dose unless she discusses this with [name] first. Maria Marquez agrees to follow up in 3 weeks as directed.  Depression with Emotional Eating Behaviors We discussed behavior modification techniques today to help Maria Marquez deal with her emotional eating and depression. She has agreed to take Wellbutrin SR 150 mg BID #60 with no refills and agreed to  follow up as directed.  Obesity Maria Marquez is currently in the action stage of change. As such, her goal is to continue with weight loss efforts. She has agreed to follow the Category 2 plan. Maria Marquez has been instructed to work up to a goal of 150 minutes of combined cardio and strengthening exercise per week for weight loss and overall health benefits. We discussed the following Behavioral Modification Strategies today: work on meal planning and easy cooking plans, emotional eating strategies, and ways to avoid boredom eating.  Maria Marquez has agreed to follow up with our clinic in  3 weeks. She was informed of the importance of frequent follow up visits to maximize her success with intensive lifestyle modifications for her multiple health conditions.  ALLERGIES: Allergies  Allergen Reactions  . Rosuvastatin Other (See Comments)    Muscle Pain in Thighs  . Shellfish Allergy Hives  . Sulfa Antibiotics Nausea Only  . Latex Rash  . Lisinopril Cough    MEDICATIONS: Current Outpatient Medications on File Prior to Visit  Medication Sig Dispense Refill  . aspirin 81 MG chewable tablet Chew by mouth daily.    [name] co-enzyme Q-10 30 MG capsule Take 1 capsule (30 mg total) by mouth daily.    [name] EPINEPHrine (EPIPEN 2-PAK) 0.3 mg/0.3 mL IJ SOAJ injection as needed.    [name] estradiol-norethindrone (ACTIVELLA) 1-0.5 MG tablet Take 1 tablet by mouth daily. 90 tablet 1  . ezetimibe (ZETIA) 10 MG tablet TAKE 1 TABLET EVERY DAY 90 tablet 2  . levothyroxine (SYNTHROID, LEVOTHROID) 137 MCG tablet Take 1 tablet (137 mcg total) by mouth at bedtime. 90 tablet 3  . losartan-hydrochlorothiazide (HYZAAR) 50-12.5 MG tablet TAKE 1 TABLET EVERY DAY 90 tablet 2  . omeprazole (PRILOSEC) 20 MG capsule TAKE 1 CAPSULE (20 MG TOTAL) BY MOUTH DAILY. 90 capsule 0  . Probiotic Product (PROBIOTIC DAILY PO) Take by mouth.    . simvastatin (ZOCOR) 10 MG tablet Take 1 tablet (10 mg total) by mouth 2 (two) times a week. 90 tablet 1   No current facility-administered medications on file prior to visit.     PAST MEDICAL HISTORY: Past Medical History:  Diagnosis Date  . Acute blood loss anemia   . Arthritis   . Back pain   . Bilious vomiting with nausea   . Constipation   . GERD (gastroesophageal reflux disease)   . HLD (hyperlipidemia)   . Hypertension   . Hypothyroid   . Joint pain   . Leg edema   . Leukocytosis   . Multiple food allergies   . Myalgia due to statin 11/23/2018  . Osteoarthritis of hip    Right  . Postmenopausal hormone replacement therapy 06/17/2015  . Prediabetes   . Sessile  colonic polyp 10/04/2017   Colonoscopy 12/2015, Dr. Fuller Plan, repeat q 5 years.    PAST SURGICAL HISTORY: Past Surgical History:  Procedure Laterality Date  . EYE SURGERY  1987  . HERNIA REPAIR    . THYROID LOBECTOMY     right side  . 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 Smoking status: Current Every Day Smoker    Packs/day: 0.25    Types: Cigarettes  . Smokeless tobacco: Current User  Substance Use Topics  . Alcohol use: Yes    Comment: rarely  .  Drug use: No    FAMILY HISTORY: Family History  Problem Relation Age of Onset  . Arthritis Mother   . Breast cancer Mother   . Schizophrenia Mother   . Diabetes Father   . Heart disease Father   . Kidney disease Father   . Stroke Sister   . Lung cancer Brother   . Heart disease Brother   . Lung cancer Brother   . Heart disease Brother   . Vision loss Brother        Glaucoma  . Alcohol abuse Brother     ROS: Review of Systems  Gastrointestinal: Negative for nausea and vomiting.  Musculoskeletal:       Negative for muscle weakness.  Psychiatric/Behavioral: Positive for depression.    PHYSICAL EXAM: Maria Marquez in no acute distress  RECENT LABS AND TESTS: BMET    Component Value Date/Time   NA 140 11/01/2018 1306   K 4.6 11/01/2018 1306   CL 102 11/01/2018 1306   CO2 22 11/01/2018 1306   GLUCOSE 107 (H) 11/01/2018 1306   GLUCOSE 123 (H) 01/13/2018 0915   BUN 17 11/01/2018 1306   CREATININE 1.20 (H) 11/01/2018 1306   CALCIUM 9.2 11/01/2018 1306   GFRNONAA 46 (L) 11/01/2018 1306   GFRAA 53 (L) 11/01/2018 1306   Lab Results  Component Value Date   HGBA1C 5.7 (H) 11/01/2018   HGBA1C 5.8 (H) 06/13/2018   HGBA1C 6.4 01/17/2018   HGBA1C 6.1 09/23/2015   Lab Results  Component Value Date   INSULIN 11.7 11/01/2018    INSULIN 15.0 06/13/2018   INSULIN 14.0 02/22/2018   CBC    Component Value Date/Time   WBC 9.0 11/01/2018 1306   WBC 8.0 01/13/2018 0915   RBC 4.79 11/01/2018 1306   RBC 4.62 01/13/2018 0915   HGB 14.5 11/01/2018 1306   HCT 43.0 11/01/2018 1306   PLT 263 02/22/2018 0936   MCV 90 11/01/2018 1306   MCH 30.3 11/01/2018 1306   MCH 30.9 01/30/2016 0405   MCHC 33.7 11/01/2018 1306   MCHC 33.8 01/13/2018 0915   RDW 13.6 11/01/2018 1306   LYMPHSABS 1.7 11/01/2018 1306   MONOABS 0.6 01/13/2018 0915   EOSABS 0.2 11/01/2018 1306   BASOSABS 0.1 11/01/2018 1306   Iron/TIBC/Ferritin/ %Sat No results found for: IRON, TIBC, FERRITIN, IRONPCTSAT Lipid Panel     Component Value Date/Time   CHOL 222 (H) 11/01/2018 1306   TRIG 155 (H) 11/01/2018 1306   HDL 40 11/01/2018 1306   CHOLHDL 4.9 (H) 02/22/2018 0936   CHOLHDL 7 01/13/2018 0915   VLDL 53.0 (H) 01/13/2018 0915   LDLCALC 151 (H) 11/01/2018 1306   LDLDIRECT 197.0 01/13/2018 0915   Hepatic Function Panel     Component Value Date/Time   PROT 6.6 11/01/2018 1306   ALBUMIN 4.2 11/01/2018 1306   AST 13 11/01/2018 1306   ALT 15 11/01/2018 1306   ALKPHOS 82 11/01/2018 1306   BILITOT 0.6 11/01/2018 1306      Component Value Date/Time   TSH 4.190 11/01/2018 1306   TSH 2.010 06/13/2018 0843   TSH 2.350 02/22/2018 0936   Results for DRISHTI, PEPPERMAN "CHINARA HERTZBERG" (MRN [id number]) as of 02/15/2019 09:58  Ref. Range 11/01/2018 13:06  Vitamin D, 25-Hydroxy Latest Ref Range: 30.0 - 100.0 ng/mL 42.6     I, Marcille Blanco, CMA, am acting as transcriptionist for Starlyn Skeans, MD I have reviewed the above documentation for accuracy and completeness, and I agree with the above. -Laiah Pouncey  Leafy Ro, MD

## 2019-02-20 ENCOUNTER — Other Ambulatory Visit (INDEPENDENT_AMBULATORY_CARE_PROVIDER_SITE_OTHER): Payer: Self-pay | Admitting: Family Medicine

## 2019-02-20 DIAGNOSIS — E559 Vitamin D deficiency, unspecified: Secondary | ICD-10-CM

## 2019-03-07 ENCOUNTER — Encounter (INDEPENDENT_AMBULATORY_CARE_PROVIDER_SITE_OTHER): Payer: Self-pay | Admitting: Family Medicine

## 2019-03-07 ENCOUNTER — Telehealth (INDEPENDENT_AMBULATORY_CARE_PROVIDER_SITE_OTHER): Payer: Medicare HMO | Admitting: Family Medicine

## 2019-03-07 ENCOUNTER — Other Ambulatory Visit: Payer: Self-pay

## 2019-03-07 DIAGNOSIS — Z6835 Body mass index (BMI) 35.0-35.9, adult: Secondary | ICD-10-CM

## 2019-03-07 DIAGNOSIS — F3289 Other specified depressive episodes: Secondary | ICD-10-CM | POA: Diagnosis not present

## 2019-03-08 NOTE — Progress Notes (Signed)
Office: [phone]  /  Fax: [phone] TeleHealth Visit:  Maria Marquez has verbally consented to this TeleHealth visit today. The patient is located at home, the provider is located at the [employer] [facility] office. The participants in this visit include the listed provider and patient. The visit was conducted today via FaceTime.  HPI:   Chief Complaint: OBESITY Maria Marquez is here to discuss her progress with her obesity treatment plan. She is on the Category 2 plan and is following her eating plan approximately 50% of the time. She states she is exercising 0 minutes 0 times per week. Maria Marquez has done well maintaining her weight. She is not following her plan closely but is trying to be mindful and portion control. She reports her hunger is controlled.  We were unable to weigh the patient today for this TeleHealth visit. She feels as if she has maintained her weight since her last visit. She has lost 25 lbs since starting treatment with Maria Marquez.  Depression with emotional eating behaviors Maria Marquez is struggling with emotional eating and using food for comfort to the extent that it is negatively impacting her health. She often snacks when she is not hungry. Maria Marquez sometimes feels she is out of control and then feels guilty that she made poor food choices. She has been working on behavior modification techniques to help reduce her emotional eating and has been somewhat successful. Maria Marquez increased her Wellbutrin to 150 mg BID last month and appears to be doing well.  She is trying to stay busy and keeping herself more occupied, but is still struggling with the COVID-19 isolation. She shows no sign of suicidal or homicidal ideations.  Depression screen [facility] 2/9 11/23/2018 02/22/2018 12/12/2017 10/04/2017  Decreased Interest 0 3 3 0  Down, Depressed, Hopeless 0 3 2 0  PHQ - 2 Score 0 6 5 0  Altered sleeping - 2 3 -  Tired, decreased energy - 2 2 -  Change in appetite - 2 2 -  Feeling bad or failure about  yourself  - 1 0 -  Trouble concentrating - 0 1 -  Moving slowly or fidgety/restless - 0 0 -  Suicidal thoughts - 0 0 -  PHQ-9 Score - 13 13 -  Difficult doing work/chores - Somewhat difficult Somewhat difficult -   ASSESSMENT AND PLAN:  Other depression  Class 2 severe obesity with serious comorbidity and body mass index (BMI) of 35.0 to 35.9 in adult, unspecified obesity type (HCC)  PLAN:  Depression with Emotional Eating Behaviors We discussed behavior modification techniques today to help Maria Marquez deal with her emotional eating and depression. Tekela will continue Wellbutrin as is and CBT to help her with emotional eating.  I spent > than 50% of the 25 minute visit on counseling as documented in the note.  Obesity Maria Marquez is currently in the action stage of change. As such, her goal is to continue weight maintenance for now. She has agreed to follow the Category 2 plan. Maria Marquez has been instructed to work up to a goal of 150 minutes of combined cardio and strengthening exercise per week for weight loss and overall health benefits. We discussed the following Behavioral Modification Strategies today: work on meal planning and easy cooking plans, ways to avoid boredom eating, and emotional eating strategies  Maria Marquez has agreed to follow-up with our clinic in 2 weeks. She was informed of the importance of frequent follow-up visits to maximize her success with intensive lifestyle modifications for her multiple health  conditions.  ALLERGIES: Allergies  Allergen Reactions   Rosuvastatin Other (See Comments)    Muscle Pain in Thighs   Shellfish Allergy Hives   Sulfa Antibiotics Nausea Only   Latex Rash   Lisinopril Cough    MEDICATIONS: Current Outpatient Medications on File Prior to Visit  Medication Sig Dispense Refill   aspirin 81 MG chewable tablet Chew by mouth daily.     buPROPion (WELLBUTRIN SR) 150 MG 12 hr tablet Take 1 tablet (150 mg total) by mouth 2 (two) times daily.  60 tablet 0   co-enzyme Q-10 30 MG capsule Take 1 capsule (30 mg total) by mouth daily.     EPINEPHrine (EPIPEN 2-PAK) 0.3 mg/0.3 mL IJ SOAJ injection as needed.     estradiol-norethindrone (ACTIVELLA) 1-0.5 MG tablet Take 1 tablet by mouth daily. 90 tablet 1   ezetimibe (ZETIA) 10 MG tablet TAKE 1 TABLET EVERY DAY 90 tablet 2   levothyroxine (SYNTHROID, LEVOTHROID) 137 MCG tablet Take 1 tablet (137 mcg total) by mouth at bedtime. 90 tablet 3   losartan-hydrochlorothiazide (HYZAAR) 50-12.5 MG tablet TAKE 1 TABLET EVERY DAY 90 tablet 2   omeprazole (PRILOSEC) 20 MG capsule TAKE 1 CAPSULE (20 MG TOTAL) BY MOUTH DAILY. 90 capsule 0   Probiotic Product (PROBIOTIC DAILY PO) Take by mouth.     simvastatin (ZOCOR) 10 MG tablet Take 1 tablet (10 mg total) by mouth 2 (two) times a week. 90 tablet 1   Vitamin D, Ergocalciferol, (DRISDOL) 1.25 MG (50000 UT) CAPS capsule Take 1 capsule (50,000 Units total) by mouth every 7 (seven) days. 4 capsule 0   No current facility-administered medications on file prior to visit.     PAST MEDICAL HISTORY: Past Medical History:  Diagnosis Date   Acute blood loss anemia    Arthritis    Back pain    Bilious vomiting with nausea    Constipation    GERD (gastroesophageal reflux disease)    HLD (hyperlipidemia)    Hypertension    Hypothyroid    Joint pain    Leg edema    Leukocytosis    Multiple food allergies    Myalgia due to statin 11/23/2018   Osteoarthritis of hip    Right   Postmenopausal hormone replacement therapy 06/17/2015   Prediabetes    Sessile colonic polyp 10/04/2017   Colonoscopy 12/2015, Dr. Fuller Plan, repeat q 5 years.    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/2016   Procedure: RIGHT TOTAL HIP ARTHROPLASTY ANTERIOR APPROACH;  Surgeon: [name], MD;  Location: [facility];  Service: Orthopedics;  Laterality: Right;    SOCIAL HISTORY: Social History   Tobacco Use   Smoking status: Current Every Day Smoker    Packs/day: 0.25    Types: Cigarettes   Smokeless tobacco: Current User  Substance Use Topics   Alcohol use: Yes    Comment: rarely   Drug use: No    FAMILY HISTORY: Family History  Problem Relation Age of Onset   Arthritis Mother    Breast cancer Mother    Schizophrenia Mother    Diabetes Father    Heart disease Father    Kidney disease Father    Stroke Sister    Lung cancer Brother    Heart disease Brother    Lung cancer Brother  Heart disease Brother    Vision loss Brother        Glaucoma   Alcohol abuse Brother    ROS: Review of Systems  Psychiatric/Behavioral: Positive for depression (emotional eating). Negative for suicidal ideas.       Negative for homicidal ideas.   PHYSICAL EXAM: Pt in no acute distress  RECENT LABS AND TESTS: BMET    Component Value Date/Time   NA 140 11/01/2018 1306   K 4.6 11/01/2018 1306   CL 102 11/01/2018 1306   CO2 22 11/01/2018 1306   GLUCOSE 107 (H) 11/01/2018 1306   GLUCOSE 123 (H) 01/13/2018 0915   BUN 17 11/01/2018 1306   CREATININE 1.20 (H) 11/01/2018 1306   CALCIUM 9.2 11/01/2018 1306   GFRNONAA 46 (L) 11/01/2018 1306   GFRAA 53 (L) 11/01/2018 1306   Lab Results  Component Value Date   HGBA1C 5.7 (H) 11/01/2018   HGBA1C 5.8 (H) 06/13/2018   HGBA1C 6.4 01/17/2018   HGBA1C 6.1 09/23/2015   Lab Results  Component Value Date   INSULIN 11.7 11/01/2018   INSULIN 15.0 06/13/2018   INSULIN 14.0 02/22/2018   CBC    Component Value Date/Time   WBC 9.0 11/01/2018 1306   WBC 8.0 01/13/2018 0915   RBC 4.79 11/01/2018 1306   RBC 4.62 01/13/2018 0915   HGB 14.5 11/01/2018 1306   HCT 43.0 11/01/2018 1306   PLT 263 02/22/2018 0936   MCV 90 11/01/2018 1306   MCH 30.3 11/01/2018 1306   MCH 30.9 01/30/2016 0405   MCHC 33.7 11/01/2018 1306   MCHC 33.8  01/13/2018 0915   RDW 13.6 11/01/2018 1306   LYMPHSABS 1.7 11/01/2018 1306   MONOABS 0.6 01/13/2018 0915   EOSABS 0.2 11/01/2018 1306   BASOSABS 0.1 11/01/2018 1306   Iron/TIBC/Ferritin/ %Sat No results found for: IRON, TIBC, FERRITIN, IRONPCTSAT Lipid Panel     Component Value Date/Time   CHOL 222 (H) 11/01/2018 1306   TRIG 155 (H) 11/01/2018 1306   HDL 40 11/01/2018 1306   CHOLHDL 4.9 (H) 02/22/2018 0936   CHOLHDL 7 01/13/2018 0915   VLDL 53.0 (H) 01/13/2018 0915   LDLCALC 151 (H) 11/01/2018 1306   LDLDIRECT 197.0 01/13/2018 0915   Hepatic Function Panel     Component Value Date/Time   PROT 6.6 11/01/2018 1306   ALBUMIN 4.2 11/01/2018 1306   AST 13 11/01/2018 1306   ALT 15 11/01/2018 1306   ALKPHOS 82 11/01/2018 1306   BILITOT 0.6 11/01/2018 1306      Component Value Date/Time   TSH 4.190 11/01/2018 1306   TSH 2.010 06/13/2018 0843   TSH 2.350 02/22/2018 0936   Results for DREW, HERMAN "FRANCENE MCERLEAN" (MRN [id number]) as of 03/08/2019 11:18  Ref. Range 11/01/2018 13:06  Vitamin D, 25-Hydroxy Latest Ref Range: 30.0 - 100.0 ng/mL 42.6   I, Michaelene Song, am acting as Location manager for [name], MD I have reviewed the above documentation for accuracy and completeness, and I agree with the above. -[name], MD

## 2019-03-16 ENCOUNTER — Other Ambulatory Visit: Payer: Self-pay | Admitting: Family Medicine

## 2019-03-16 ENCOUNTER — Encounter: Payer: Self-pay | Admitting: Family Medicine

## 2019-03-28 ENCOUNTER — Encounter (INDEPENDENT_AMBULATORY_CARE_PROVIDER_SITE_OTHER): Payer: Self-pay | Admitting: Family Medicine

## 2019-03-28 ENCOUNTER — Other Ambulatory Visit: Payer: Self-pay

## 2019-03-28 ENCOUNTER — Other Ambulatory Visit: Payer: Self-pay | Admitting: *Deleted

## 2019-03-28 ENCOUNTER — Telehealth (INDEPENDENT_AMBULATORY_CARE_PROVIDER_SITE_OTHER): Payer: Medicare HMO | Admitting: Family Medicine

## 2019-03-28 DIAGNOSIS — F3289 Other specified depressive episodes: Secondary | ICD-10-CM | POA: Diagnosis not present

## 2019-03-28 DIAGNOSIS — Z6835 Body mass index (BMI) 35.0-35.9, adult: Secondary | ICD-10-CM | POA: Diagnosis not present

## 2019-03-28 MED ORDER — OMEPRAZOLE 20 MG PO CPDR: 20.0000 mg | DELAYED_RELEASE_CAPSULE | Freq: Every day | ORAL | 0 refills | Status: DC

## 2019-03-29 NOTE — Progress Notes (Signed)
Office: [phone]  /  Fax: [phone] TeleHealth Visit:  Maria Marquez has verbally consented to this TeleHealth visit today. The patient is located at home, the provider is located at the [employer] [facility] office. The participants in this visit include the listed provider and patient. The visit was conducted today via face time.  HPI:   Chief Complaint: OBESITY Maria Marquez is here to discuss her progress with her obesity treatment plan. She is on the Category 2 plan and is following her eating plan approximately 30 % of the time. She states she is doing a little gardening and walking. Maria Marquez has been off track with her diet. Her depression appears to be worsening an her motivation to improve her health has decreased. She is struggling to sleep well and is doing more comfort eating.  We were unable to weigh the patient today for this TeleHealth visit. She feels as if she has gained weight since her last visit. She has lost 25 lbs since starting treatment with [name].  Depression with Emotional Eating Behaviors Maria Marquez is on Wellbutrin, but she is still struggling with decrease in mood especially during COVID-19 isolation. She is not sleeping well and is not exercising, and she is struggling with comfort eating. Maria Marquez struggles with emotional eating and using food for comfort to the extent that it is negatively impacting her health. She often snacks when she is not hungry. Maria Marquez sometimes feels she is out of control and then feels guilty that she made poor food choices. She has been working on behavior modification techniques to help reduce her emotional eating and has been somewhat successful. She shows no sign of suicidal or homicidal ideations.  Depression screen [facility] 2/9 11/23/2018 02/22/2018 12/12/2017 10/04/2017  Decreased Interest 0 3 3 0  Down, Depressed, Hopeless 0 3 2 0  PHQ - 2 Score 0 6 5 0  Altered sleeping - 2 3 -  Tired, decreased energy - 2 2 -  Change in appetite - 2 2 -  Feeling  bad or failure about yourself  - 1 0 -  Trouble concentrating - 0 1 -  Moving slowly or fidgety/restless - 0 0 -  Suicidal thoughts - 0 0 -  PHQ-9 Score - 13 13 -  Difficult doing work/chores - Somewhat difficult Somewhat difficult -    ASSESSMENT AND PLAN:  Other depression  Class 2 severe obesity with serious comorbidity and body mass index (BMI) of 35.0 to 35.9 in adult, unspecified obesity type (HCC)  PLAN:  Depression with Emotional Eating Behaviors We discussed behavior modification techniques today to help Maria Marquez deal with her emotional eating and depression. [name] was offered Lexapro, but she declined. She will continue taking Wellbutrin as is and we will continue to monitor closely. Maria Marquez agrees to follow up with our clinic in 3 weeks.  I spent > than 50% of the 25 minute visit on counseling as documented in the note.  Obesity Maria Marquez is currently in the action stage of change. As such, her goal is to maintain weight for now  Maria Marquez will continue to work on depression. She has agreed to follow the Category 2 plan Maria Marquez has been instructed to work up to a goal of 150 minutes of combined cardio and strengthening exercise per week for weight loss and overall health benefits. We discussed the following Behavioral Modification Strategies today: emotional eating strategies, ways to avoid boredom eating and ways to avoid night time snacking   Maria Marquez has agreed to follow up  with our clinic in 3 weeks. She was informed of the importance of frequent follow up visits to maximize her success with intensive lifestyle modifications for her multiple health conditions.  ALLERGIES: Allergies  Allergen Reactions  . Rosuvastatin Other (See Comments)    Muscle Pain in Thighs  . Shellfish Allergy Hives  . Sulfa Antibiotics Nausea Only  . Latex Rash  . Lisinopril Cough    MEDICATIONS: Current Outpatient Medications on File Prior to Visit  Medication Sig Dispense Refill  . aspirin 81 MG  chewable tablet Chew by mouth daily.    [name] buPROPion (WELLBUTRIN SR) 150 MG 12 hr tablet Take 1 tablet (150 mg total) by mouth 2 (two) times daily. 60 tablet 0  . co-enzyme Q-10 30 MG capsule Take 1 capsule (30 mg total) by mouth daily.    [name] EPINEPHrine (EPIPEN 2-PAK) 0.3 mg/0.3 mL IJ SOAJ injection as needed.    [name] estradiol-norethindrone (ACTIVELLA) 1-0.5 MG tablet TAKE ONE TABLET BY MOUTH ONE TIME DAILY  84 tablet 0  . ezetimibe (ZETIA) 10 MG tablet TAKE 1 TABLET EVERY DAY 90 tablet 2  . levothyroxine (SYNTHROID, LEVOTHROID) 137 MCG tablet Take 1 tablet (137 mcg total) by mouth at bedtime. 90 tablet 3  . losartan-hydrochlorothiazide (HYZAAR) 50-12.5 MG tablet TAKE 1 TABLET EVERY DAY 90 tablet 2  . Probiotic Product (PROBIOTIC DAILY PO) Take by mouth.    . simvastatin (ZOCOR) 10 MG tablet Take 1 tablet (10 mg total) by mouth 2 (two) times a week. 90 tablet 1  . Vitamin D, Ergocalciferol, (DRISDOL) 1.25 MG (50000 UT) CAPS capsule Take 1 capsule (50,000 Units total) by mouth every 7 (seven) days. 4 capsule 0   No current facility-administered medications on file prior to visit.     PAST MEDICAL HISTORY: Past Medical History:  Diagnosis Date  . Acute blood loss anemia   . Arthritis   . Back pain   . Bilious vomiting with nausea   . Constipation   . GERD (gastroesophageal reflux disease)   . HLD (hyperlipidemia)   . Hypertension   . Hypothyroid   . Joint pain   . Leg edema   . Leukocytosis   . Multiple food allergies   . Myalgia due to statin 11/23/2018  . Osteoarthritis of hip    Right  . Postmenopausal hormone replacement therapy 06/17/2015  . Prediabetes   . Sessile colonic polyp 10/04/2017   Colonoscopy 12/2015, Dr. Fuller Plan, repeat q 5 years.    PAST SURGICAL HISTORY: Past Surgical History:  Procedure Laterality Date  . EYE SURGERY  1987  . HERNIA REPAIR    . THYROID LOBECTOMY     right side  . TOTAL HIP ARTHROPLASTY Right 01/28/2016   Procedure: RIGHT TOTAL HIP ARTHROPLASTY  ANTERIOR APPROACH;  Surgeon: [name], MD;  Location: [facility];  Service: Orthopedics;  Laterality: Right;    SOCIAL HISTORY: Social History   Tobacco Use  . Smoking status: Current Every Day Smoker    Packs/day: 0.25    Types: Cigarettes  . Smokeless tobacco: Current User  Substance Use Topics  . Alcohol use: Yes    Comment: rarely  . Drug use: No    FAMILY HISTORY: Family History  Problem Relation Age of Onset  . Arthritis Mother   . Breast cancer Mother   . Schizophrenia Mother   . Diabetes Father   . Heart disease Father   . Kidney disease Father   . Stroke Sister   . Lung cancer Brother   .  Heart disease Brother   . Lung cancer Brother   . Heart disease Brother   . Vision loss Brother        Glaucoma  . Alcohol abuse Brother     ROS: Review of Systems  Constitutional: Negative for weight loss.  Psychiatric/Behavioral: Positive for depression. Negative for suicidal ideas.    PHYSICAL EXAM: Pt in no acute distress  RECENT LABS AND TESTS: BMET    Component Value Date/Time   NA 140 11/01/2018 1306   K 4.6 11/01/2018 1306   CL 102 11/01/2018 1306   CO2 22 11/01/2018 1306   GLUCOSE 107 (H) 11/01/2018 1306   GLUCOSE 123 (H) 01/13/2018 0915   BUN 17 11/01/2018 1306   CREATININE 1.20 (H) 11/01/2018 1306   CALCIUM 9.2 11/01/2018 1306   GFRNONAA 46 (L) 11/01/2018 1306   GFRAA 53 (L) 11/01/2018 1306   Lab Results  Component Value Date   HGBA1C 5.7 (H) 11/01/2018   HGBA1C 5.8 (H) 06/13/2018   HGBA1C 6.4 01/17/2018   HGBA1C 6.1 09/23/2015   Lab Results  Component Value Date   INSULIN 11.7 11/01/2018   INSULIN 15.0 06/13/2018   INSULIN 14.0 02/22/2018   CBC    Component Value Date/Time   WBC 9.0 11/01/2018 1306   WBC 8.0 01/13/2018 0915   RBC 4.79 11/01/2018 1306   RBC 4.62 01/13/2018 0915   HGB 14.5 11/01/2018 1306   HCT 43.0 11/01/2018 1306   PLT 263 02/22/2018 0936   MCV 90 11/01/2018 1306   MCH 30.3 11/01/2018 1306   MCH 30.9  01/30/2016 0405   MCHC 33.7 11/01/2018 1306   MCHC 33.8 01/13/2018 0915   RDW 13.6 11/01/2018 1306   LYMPHSABS 1.7 11/01/2018 1306   MONOABS 0.6 01/13/2018 0915   EOSABS 0.2 11/01/2018 1306   BASOSABS 0.1 11/01/2018 1306   Iron/TIBC/Ferritin/ %Sat No results found for: IRON, TIBC, FERRITIN, IRONPCTSAT Lipid Panel     Component Value Date/Time   CHOL 222 (H) 11/01/2018 1306   TRIG 155 (H) 11/01/2018 1306   HDL 40 11/01/2018 1306   CHOLHDL 4.9 (H) 02/22/2018 0936   CHOLHDL 7 01/13/2018 0915   VLDL 53.0 (H) 01/13/2018 0915   LDLCALC 151 (H) 11/01/2018 1306   LDLDIRECT 197.0 01/13/2018 0915   Hepatic Function Panel     Component Value Date/Time   PROT 6.6 11/01/2018 1306   ALBUMIN 4.2 11/01/2018 1306   AST 13 11/01/2018 1306   ALT 15 11/01/2018 1306   ALKPHOS 82 11/01/2018 1306   BILITOT 0.6 11/01/2018 1306      Component Value Date/Time   TSH 4.190 11/01/2018 1306   TSH 2.010 06/13/2018 0843   TSH 2.350 02/22/2018 0936      I, Trixie Dredge, am acting as transcriptionist for [name], MD I have reviewed the above documentation for accuracy and completeness, and I agree with the above. -[name], MD

## 2019-04-14 ENCOUNTER — Emergency Department (HOSPITAL_COMMUNITY)
Admission: EM | Admit: 2019-04-14 | Discharge: 2019-04-14 | Disposition: A | Discharge status: Home / Self Care | Payer: Medicare HMO | Attending: Emergency Medicine | Admitting: Emergency Medicine

## 2019-04-14 ENCOUNTER — Encounter (HOSPITAL_COMMUNITY): Payer: Self-pay | Admitting: *Deleted

## 2019-04-14 ENCOUNTER — Emergency Department (HOSPITAL_COMMUNITY): Payer: Medicare HMO

## 2019-04-14 ENCOUNTER — Other Ambulatory Visit: Payer: Self-pay

## 2019-04-14 DIAGNOSIS — E890 Postprocedural hypothyroidism: Secondary | ICD-10-CM | POA: Diagnosis not present

## 2019-04-14 DIAGNOSIS — Z20828 Contact with and (suspected) exposure to other viral communicable diseases: Secondary | ICD-10-CM | POA: Insufficient documentation

## 2019-04-14 DIAGNOSIS — R05 Cough: Secondary | ICD-10-CM | POA: Diagnosis not present

## 2019-04-14 DIAGNOSIS — B349 Viral infection, unspecified: Secondary | ICD-10-CM

## 2019-04-14 DIAGNOSIS — F17210 Nicotine dependence, cigarettes, uncomplicated: Secondary | ICD-10-CM | POA: Insufficient documentation

## 2019-04-14 DIAGNOSIS — Z91040 Latex allergy status: Secondary | ICD-10-CM | POA: Insufficient documentation

## 2019-04-14 DIAGNOSIS — Z96641 Presence of right artificial hip joint: Secondary | ICD-10-CM | POA: Insufficient documentation

## 2019-04-14 DIAGNOSIS — R0602 Shortness of breath: Secondary | ICD-10-CM | POA: Diagnosis not present

## 2019-04-14 DIAGNOSIS — R509 Fever, unspecified: Secondary | ICD-10-CM | POA: Diagnosis not present

## 2019-04-14 DIAGNOSIS — Z7982 Long term (current) use of aspirin: Secondary | ICD-10-CM | POA: Diagnosis not present

## 2019-04-14 DIAGNOSIS — Z79899 Other long term (current) drug therapy: Secondary | ICD-10-CM | POA: Insufficient documentation

## 2019-04-14 DIAGNOSIS — I10 Essential (primary) hypertension: Secondary | ICD-10-CM | POA: Insufficient documentation

## 2019-04-14 DIAGNOSIS — Z20822 Contact with and (suspected) exposure to covid-19: Secondary | ICD-10-CM

## 2019-04-14 LAB — URINALYSIS, ROUTINE W REFLEX MICROSCOPIC
Bilirubin Urine: NEGATIVE
Glucose, UA: NEGATIVE mg/dL
Ketones, ur: 5 mg/dL — AB
Leukocytes,Ua: NEGATIVE
Nitrite: NEGATIVE
Protein, ur: NEGATIVE mg/dL
Specific Gravity, Urine: 1.011 (ref 1.005–1.030)
pH: 6 (ref 5.0–8.0)

## 2019-04-14 LAB — CBC WITH DIFFERENTIAL/PLATELET
Abs Immature Granulocytes: 0.04 10*3/uL (ref 0.00–0.07)
Basophils Absolute: 0 10*3/uL (ref 0.0–0.1)
Basophils Relative: 0 %
Eosinophils Absolute: 0.1 10*3/uL (ref 0.0–0.5)
Eosinophils Relative: 2 %
HCT: 42.4 % (ref 36.0–46.0)
Hemoglobin: 13.9 g/dL (ref 12.0–15.0)
Immature Granulocytes: 1 %
Lymphocytes Relative: 18 %
Lymphs Abs: 1.4 10*3/uL (ref 0.7–4.0)
MCH: 30.8 pg (ref 26.0–34.0)
MCHC: 32.8 g/dL (ref 30.0–36.0)
MCV: 93.8 fL (ref 80.0–100.0)
Monocytes Absolute: 0.7 10*3/uL (ref 0.1–1.0)
Monocytes Relative: 8 %
Neutro Abs: 5.9 10*3/uL (ref 1.7–7.7)
Neutrophils Relative %: 71 %
Platelets: 250 10*3/uL (ref 150–400)
RBC: 4.52 MIL/uL (ref 3.87–5.11)
RDW: 13.4 % (ref 11.5–15.5)
WBC: 8.2 10*3/uL (ref 4.0–10.5)
nRBC: 0 % (ref 0.0–0.2)

## 2019-04-14 LAB — BASIC METABOLIC PANEL
Anion gap: 10 (ref 5–15)
BUN: 16 mg/dL (ref 8–23)
CO2: 22 mmol/L (ref 22–32)
Calcium: 9 mg/dL (ref 8.9–10.3)
Chloride: 108 mmol/L (ref 98–111)
Creatinine, Ser: 1.3 mg/dL — ABNORMAL HIGH (ref 0.44–1.00)
GFR calc Af Amer: 48 mL/min — ABNORMAL LOW (ref >60)
GFR calc non Af Amer: 41 mL/min — ABNORMAL LOW (ref >60)
Glucose, Bld: 84 mg/dL (ref 70–99)
Potassium: 3.7 mmol/L (ref 3.5–5.1)
Sodium: 140 mmol/L (ref 135–145)

## 2019-04-14 LAB — TROPONIN I (HIGH SENSITIVITY): Troponin I (High Sensitivity): 3 ng/L (ref <18)

## 2019-04-14 MED ORDER — ALBUTEROL SULFATE HFA 108 (90 BASE) MCG/ACT IN AERS
2.0000 | INHALATION_SPRAY | Freq: Once | RESPIRATORY_TRACT | Status: AC
  Administered 2019-04-14: 2 via RESPIRATORY_TRACT
  Filled 2019-04-14: qty 6.7

## 2019-04-14 NOTE — ED Provider Notes (Signed)
[facility] DEPT Provider Note   CSN: [id number] Arrival date & time: 04/14/19  1329     History   Chief Complaint Chief Complaint  Patient presents with  . Shortness of Breath  . Fever  . Diarrhea  . Fatigue  . Cough    HPI Maria Marquez is a 73 y.o. female.  Presents emergency room with a chief complaint of cough, subjective fever, shortness of breath, fatigue and diarrhea.  Patient states symptoms have been going on for the past few days, symptoms were worse last night and had improved somewhat today.  She states cough has been dry, nonproductive, diarrhea has been nonbloody, no frequent, no episodes today.  Has had generalized malaise and fatigue, denies focal weakness.  No associated chest pain but has felt more short of breath than normal. No known sick contacts, no known exposures to COVID-19. Patient denies history of coronary artery disease, no DVT/PE, clotting disorder, does smoke tobacco.  States she used to have an inhaler but no longer has 1.    HPI  Past Medical History:  Diagnosis Date  . Acute blood loss anemia   . Arthritis   . Back pain   . Bilious vomiting with nausea   . Constipation   . GERD (gastroesophageal reflux disease)   . HLD (hyperlipidemia)   . Hypertension   . Hypothyroid   . Joint pain   . Leg edema   . Leukocytosis   . Multiple food allergies   . Myalgia due to statin 11/23/2018  . Osteoarthritis of hip    Right  . Postmenopausal hormone replacement therapy 06/17/2015  . Prediabetes   . Sessile colonic polyp 10/04/2017   Colonoscopy 12/2015, Dr. Fuller Plan, repeat q 5 years.    Patient Active Problem List   Diagnosis Date Noted  . Myalgia due to statin 11/23/2018  . Umbilical hernia 40/98/1191  . Sessile colonic polyp 10/04/2017  . Tobacco dependence 12/09/2015  . Hematuria 07/28/2015  . Status post right hip replacement 07/21/2015  . ACE inhibitor intolerance 06/17/2015  . DJD (degenerative joint  disease), lumbar 06/17/2015  . Essential hypertension 06/17/2015  . Gastroesophageal reflux disease without esophagitis 06/17/2015  . Mixed hyperlipidemia 06/17/2015  . Morbid obesity ([place]) 06/17/2015  . Post-menopause on HRT (hormone replacement therapy) 06/17/2015  . 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 Primary osteoarthritis involving multiple joints 06/17/2015    Past Surgical History:  Procedure Laterality Date  . EYE SURGERY  1987  . HERNIA REPAIR    . THYROID LOBECTOMY     right side  . TOTAL HIP ARTHROPLASTY Right 01/28/2016   Procedure: RIGHT TOTAL HIP ARTHROPLASTY ANTERIOR APPROACH;  Surgeon: [name], MD;  Location: [facility];  Service: Orthopedics;  Laterality: Right;     OB History    Gravida  2   Para      Term      Preterm      AB      Living  2     SAB      TAB      Ectopic      Multiple      Live Births               Home Medications    Prior to Admission medications   Medication Sig Start Date End Date Taking? Authorizing Provider  aspirin 81 MG chewable tablet Chew by mouth daily.    [provider]  buPROPion (WELLBUTRIN SR) 150 MG 12 hr  tablet Take 1 tablet (150 mg total) by mouth 2 (two) times daily. 02/14/19   [name] D, MD  co-enzyme Q-10 30 MG capsule Take 1 capsule (30 mg total) by mouth daily. 11/23/18   Leamon Arnt, MD  EPINEPHrine (EPIPEN 2-PAK) 0.3 mg/0.3 mL IJ SOAJ injection as needed. 04/30/16   [provider]  estradiol-norethindrone (ACTIVELLA) 1-0.5 MG tablet TAKE ONE TABLET BY MOUTH ONE TIME DAILY  03/16/19   Leamon Arnt, MD  ezetimibe (ZETIA) 10 MG tablet TAKE 1 TABLET EVERY DAY 11/02/18   Leamon Arnt, MD  levothyroxine (SYNTHROID, LEVOTHROID) 137 MCG tablet Take 1 tablet (137 mcg total) by mouth at bedtime. 11/23/18   Leamon Arnt, MD  losartan-hydrochlorothiazide [facility]) 50-12.5 MG tablet TAKE 1 TABLET EVERY DAY 11/02/18   Leamon Arnt, MD  omeprazole (PRILOSEC) 20 MG capsule  Take 1 capsule (20 mg total) by mouth daily. 03/28/19   Leamon Arnt, MD  Probiotic Product (PROBIOTIC DAILY PO) Take by mouth.    [provider]  simvastatin (ZOCOR) 10 MG tablet Take 1 tablet (10 mg total) by mouth 2 (two) times a week. 11/23/18   Leamon Arnt, MD  Vitamin D, Ergocalciferol, (DRISDOL) 1.25 MG (50000 UT) CAPS capsule Take 1 capsule (50,000 Units total) by mouth every 7 (seven) days. 02/14/19   Starlyn Skeans, MD    Family History Family History  Problem Relation Age of Onset  . Arthritis Mother   . Breast cancer Mother   . Schizophrenia Mother   . Diabetes Father   . Heart disease Father   . Kidney disease Father   . Stroke Sister   . Lung cancer Brother   . Heart disease Brother   . Lung cancer Brother   . Heart disease Brother   . Vision loss Brother        Glaucoma  . Alcohol abuse Brother     Social History Social History   Tobacco Use  . Smoking status: Current Every Day Smoker    Packs/day: 0.25    Types: Cigarettes  . Smokeless tobacco: Current User  Substance Use Topics  . Alcohol use: Yes    Comment: rarely  . Drug use: No     Allergies   Rosuvastatin, Shellfish allergy, Sulfa antibiotics, Latex, and Lisinopril   Review of Systems Review of Systems  Constitutional: Positive for chills and fatigue.  HENT: Negative for ear pain and sore throat.   Eyes: Negative for pain and visual disturbance.  Respiratory: Positive for cough and shortness of breath.   Cardiovascular: Negative for chest pain and palpitations.  Gastrointestinal: Positive for diarrhea. Negative for abdominal pain and vomiting.  Genitourinary: Negative for dysuria and hematuria.  Musculoskeletal: Negative for arthralgias and back pain.  Skin: Negative for color change and rash.  Neurological: Negative for seizures and syncope.  All other systems reviewed and are negative.    Physical Exam Updated Vital Signs BP (!) 162/63   Pulse 68   Temp 97.8 F  (36.6 C) (Oral)   Resp 15   SpO2 98%   Physical Exam Vitals signs and nursing note reviewed.  Constitutional:      General: She is not in acute distress.    Appearance: She is well-developed.  HENT:     Head: Normocephalic and atraumatic.  Eyes:     Conjunctiva/sclera: Conjunctivae normal.  Neck:     Musculoskeletal: Neck supple.  Cardiovascular:     Rate and Rhythm: Normal  rate and regular rhythm.     Heart sounds: No murmur.  Pulmonary:     Effort: Pulmonary effort is normal. No respiratory distress.     Comments: Mild end expiratory wheeze bilaterally, no crackles, no tachypnea, no increased work of breathing Abdominal:     Palpations: Abdomen is soft.     Tenderness: There is no abdominal tenderness.  Musculoskeletal:     Left lower leg: She exhibits no tenderness.  Skin:    General: Skin is warm and dry.  Neurological:     Mental Status: She is alert.      ED Treatments / Results  Labs (all labs ordered are listed, but only abnormal results are displayed) Labs Reviewed  BASIC METABOLIC PANEL - Abnormal; Notable for the following components:      Result Value   Creatinine, Ser 1.30 (*)    GFR calc non Af Amer 41 (*)    GFR calc Af Amer 48 (*)    All other components within normal limits  URINALYSIS, ROUTINE W REFLEX MICROSCOPIC - Abnormal; Notable for the following components:   Hgb urine dipstick LARGE (*)    Ketones, ur 5 (*)    Bacteria, UA RARE (*)    All other components within normal limits  NOVEL CORONAVIRUS, NAA (HOSPITAL ORDER, SEND-OUT TO REF LAB)  CBC WITH DIFFERENTIAL/PLATELET  TROPONIN I (HIGH SENSITIVITY)    EKG EKG Interpretation  Date/Time:  Saturday April 14 2019 14:01:49 EDT Ventricular Rate:  70 PR Interval:    QRS Duration: 90 QT Interval:  405 QTC Calculation: 437 R Axis:   72 Text Interpretation:  Sinus rhythm Low voltage with right axis deviation Confirmed by Madalyn Rob [phone]) on 04/14/2019 6:43:07 PM   Radiology  Dg Chest Portable 1 View  Result Date: 04/14/2019 CLINICAL DATA:  Cough, fever, COVID-19 exposure EXAM: PORTABLE CHEST 1 VIEW COMPARISON:  11/11/2015 FINDINGS: The heart size and mediastinal contours are within normal limits. Both lungs are clear. The visualized skeletal structures are unremarkable. IMPRESSION: No acute abnormality of the lungs in AP portable projection. Electronically Signed   By: Eddie Candle M.D.   On: 04/14/2019 14:33    Procedures Procedures (including critical care time)  Medications Ordered in ED Medications  albuterol (VENTOLIN HFA) 108 (90 Base) MCG/ACT inhaler 2 puff (2 puffs Inhalation Given 04/14/19 1515)     Initial Impression / Assessment and Plan / ED Course  I have reviewed the triage vital signs and the nursing notes.  Pertinent labs & imaging results that were available during my care of the patient were reviewed by me and considered in my medical decision making (see chart for details).  Clinical Course as of Apr 13 1844  Sat Apr 14, 2019  1431 Complete initial assessment, flulike symptoms, will check labs, CXR and reassess   [RD]    Clinical Course User Index [RD] Lucrezia Starch, MD      73 year old lady who presented to emergency department with cough, chills, fatigue, diarrhea and some dyspnea.  On exam patient was well-appearing with normal vital signs.  Chest x-ray without evidence for pneumonia.  Given consolation of symptoms, suspect acute viral illness. Noted mild wheeze, will give albuterol inhaler. Labs within normal limits, believe patient appropriate for outpatient management this time.  Will send for COVID-19 test.  Recommended close recheck by PCP.    After the discussed management above, the patient was determined to be safe for discharge.  The patient was in agreement with this  plan and all questions regarding their care were answered.  ED return precautions were discussed and the patient will return to the ED with any  significant worsening of condition.   Final Clinical Impressions(s) / ED Diagnoses   Final diagnoses:  Viral illness  Suspected Covid-19 Virus Infection    ED Discharge Orders    None       Lucrezia Starch, MD 04/14/19 [id number]

## 2019-04-14 NOTE — Discharge Instructions (Signed)
Please call your primary doctor to schedule recheck early next week ideally for her Monday or Tuesday.  This can be done as a virtual visit.  I recommend following isolation precautions until you receive the results of your COVID test.  If you develop difficulty breathing, fever, chest pain, vomiting, abdominal pain or other new concerning symptoms please return to the ER for reassessment.  Recommend taking Tylenol as needed for myalgias, fevers.

## 2019-04-14 NOTE — ED Notes (Signed)
Patient is aware that urine sample is needed. Personal toilet at bedside.

## 2019-04-14 NOTE — ED Notes (Signed)
Pt reports that her daughter is aware of her discharge status, she does not need staff to alert pts guardian.  Pt AOx4.

## 2019-04-14 NOTE — ED Triage Notes (Signed)
Pt states she can not take a deep breath, has a dry cough, fever, fatigue and diarrhea, worse yesterday.

## 2019-04-15 LAB — NOVEL CORONAVIRUS, NAA (HOSP ORDER, SEND-OUT TO REF LAB; TAT 18-24 HRS): SARS-CoV-2, NAA: NOT DETECTED

## 2019-04-18 ENCOUNTER — Other Ambulatory Visit: Payer: Self-pay

## 2019-04-18 ENCOUNTER — Telehealth (INDEPENDENT_AMBULATORY_CARE_PROVIDER_SITE_OTHER): Payer: Medicare HMO | Admitting: Family Medicine

## 2019-04-18 ENCOUNTER — Encounter (INDEPENDENT_AMBULATORY_CARE_PROVIDER_SITE_OTHER): Payer: Self-pay | Admitting: Family Medicine

## 2019-04-18 DIAGNOSIS — K635 Polyp of colon: Secondary | ICD-10-CM

## 2019-04-18 DIAGNOSIS — R0602 Shortness of breath: Secondary | ICD-10-CM

## 2019-04-18 DIAGNOSIS — Z6835 Body mass index (BMI) 35.0-35.9, adult: Secondary | ICD-10-CM | POA: Diagnosis not present

## 2019-04-18 DIAGNOSIS — E559 Vitamin D deficiency, unspecified: Secondary | ICD-10-CM

## 2019-04-18 DIAGNOSIS — Z720 Tobacco use: Secondary | ICD-10-CM

## 2019-04-18 DIAGNOSIS — F3289 Other specified depressive episodes: Secondary | ICD-10-CM | POA: Diagnosis not present

## 2019-04-18 MED ORDER — BUPROPION HCL ER (SR) 150 MG PO TB12: 150.0000 mg | ORAL_TABLET | Freq: Two times a day (BID) | ORAL | 0 refills | Status: DC

## 2019-04-18 MED ORDER — ALBUTEROL SULFATE HFA 108 (90 BASE) MCG/ACT IN AERS: 1.0000 | INHALATION_SPRAY | Freq: Four times a day (QID) | RESPIRATORY_TRACT | 0 refills | Status: DC | PRN

## 2019-04-18 MED ORDER — VITAMIN D (ERGOCALCIFEROL) 1.25 MG (50000 UNIT) PO CAPS: 50000.0000 [IU] | ORAL_CAPSULE | ORAL | 0 refills | Status: DC

## 2019-04-19 NOTE — Progress Notes (Signed)
Office: [phone]  /  Fax: [phone] TeleHealth Visit:  Maria Maria Marquez has verbally consented to this TeleHealth visit today. The patient is located at home, the provider is located at the [employer] [facility] office. The participants in this visit include the listed provider and patient. The visit was conducted today via face time.  HPI:   Chief Complaint: OBESITY Maria Maria Marquez is here to discuss her progress with her obesity treatment plan. She is on the Category 2 plan and is following her eating plan approximately 20 % of the time. She states she is doing a lot of canning. Maria Maria Marquez hasn't been concentrating on weight loss recently. She is recovering from a viral illness with fever, nausea, vomiting, cough, and shortness of breath. She went to the emergency room and was tested for COVID, and it was negative. She is feeling better but not yet back to normal. She states her blood pressure range at 110/67-69. We were unable to weigh the patient today for this TeleHealth visit. She feels as if she has maintained her weight since her last visit. She has lost 25 lbs since starting treatment with [name].  Vitamin D Deficiency Maria Maria Marquez has a diagnosis of vitamin D deficiency. She is stable on prescription Vit D, but level is not yet at goal. She denies nausea, vomiting or muscle weakness.  Shortness of Breath Maria Maria Marquez notes increased shortness of breath and cough with recent viral illness. She was given albuterol in the emergency room, but only a small amount. She smokes 1/4-1/2 pack daily and has never been evaluated by Maria Marquez. This has not gotten worse recently. Maria Maria Marquez denies shortness of breath at rest or orthopnea.  Depression with Emotional Eating Behaviors Maria Maria Marquez mood is stable on Wellbutrin overall. She still struggles with emotional eating, and is anxious about COVID-19 and isolation. She is using food for comfort to the extent that it is negatively impacting her health. She often snacks when she  is not hungry. Maria Maria Marquez sometimes feels she is out of control and then feels guilty that she made poor food choices. She has been working on behavior modification techniques to help reduce her emotional eating and has been somewhat successful. She shows no sign of suicidal or homicidal ideations.  Depression screen Maria Maria Marquez 2/9 11/23/2018 02/22/2018 12/12/2017 10/04/2017  Decreased Interest 0 3 3 0  Down, Depressed, Hopeless 0 3 2 0  PHQ - 2 Score 0 6 5 0  Altered sleeping - 2 3 -  Tired, decreased energy - 2 2 -  Change in appetite - 2 2 -  Feeling bad or failure about yourself  - 1 0 -  Trouble concentrating - 0 1 -  Moving slowly or fidgety/restless - 0 0 -  Suicidal thoughts - 0 0 -  PHQ-9 Score - 13 13 -  Difficult doing work/chores - Somewhat difficult Somewhat difficult -    ASSESSMENT AND PLAN:  Vitamin D deficiency - Plan: Vitamin D, Ergocalciferol, (DRISDOL) 1.25 MG (50000 UT) CAPS capsule  Other depression - with emotional eating - Plan: buPROPion (WELLBUTRIN SR) 150 MG 12 hr tablet  SOB (shortness of breath) - Plan: Ambulatory referral to Pulmonology  Sessile colonic polyp  Tobacco use - Plan: Ambulatory referral to Pulmonology  Class 2 severe obesity with serious comorbidity and body mass index (BMI) of 35.0 to 35.9 in adult, unspecified obesity type ([place])  PLAN:  Vitamin D Deficiency Maria Maria Marquez was informed that low vitamin D levels contributes to fatigue and are associated with obesity, breast, and  colon cancer. Maria Maria Marquez agrees to continue taking prescription Vit D 50,000 IU every week #4 and we will refill for 1 month. She will follow up for routine testing of vitamin D, at least 2-3 times per year. She was informed of the risk of over-replacement of vitamin D and agrees to not increase her dose unless she discusses this with [name] first. We will recheck labs at her next in office visit. Maria Marquez agrees to follow up with our clinic in 2 to 3 weeks.  Shortness of Breath Maria Maria Marquez has agreed to  work on weight loss and gradually increase exercise to treat her exercise induced shortness of breath. We will refer her to Maria Maria Marquez for evaluation. Maria Marquez agrees to taking albuterol inhaler 1-2 puffs q 6 hours as needed. Maria Maria Marquez agrees to follow up with our clinic in 2 to 3 weeks.  Depression with Emotional Eating Behaviors We discussed behavior modification techniques today to help Maria Marquez deal with her emotional eating and depression. Maria Maria Marquez agrees to continue taking Wellbutrin SR 150 mg BID #60 and we will refill for 1 month. Maria Maria Marquez agrees to follow up with our clinic in 2 to 3 weeks.  Obesity Maria Maria Marquez is currently in the action stage of change. As such, her goal is to maintain weight for now She has agreed to follow the Category 2 plan Maria Maria Marquez has been instructed to work up to a goal of 150 minutes of combined cardio and strengthening exercise per week for weight loss and overall health benefits. We discussed the following Behavioral Modification Strategies today: decreasing simple carbohydrates  and emotional eating strategies Maria Maria Marquez is to increase H20 and we will work on maintaining her weight until she feels better.  Maria Maria Marquez has agreed to follow up with our clinic in 2 to 3 weeks. She was informed of the importance of frequent follow up visits to maximize her success with intensive lifestyle modifications for her multiple health conditions.  ALLERGIES: Allergies  Allergen Reactions  . Rosuvastatin Other (See Comments)    Muscle Pain in Thighs  . Shellfish Allergy Hives  . Sulfa Antibiotics Nausea Only  . Latex Rash  . Lisinopril Cough    MEDICATIONS: Current Outpatient Medications on File Prior to Visit  Medication Sig Dispense Refill  . aspirin 81 MG chewable tablet Chew by mouth daily.    [name] co-enzyme Q-10 30 MG capsule Take 1 capsule (30 mg total) by mouth daily.    [name] EPINEPHrine (EPIPEN 2-PAK) 0.3 mg/0.3 mL IJ SOAJ injection as needed.    [name] estradiol-norethindrone (ACTIVELLA)  1-0.5 MG tablet TAKE ONE TABLET BY MOUTH ONE TIME DAILY  84 tablet 0  . ezetimibe (ZETIA) 10 MG tablet TAKE 1 TABLET EVERY DAY 90 tablet 2  . levothyroxine (SYNTHROID, LEVOTHROID) 137 MCG tablet Take 1 tablet (137 mcg total) by mouth at bedtime. 90 tablet 3  . losartan-hydrochlorothiazide (HYZAAR) 50-12.5 MG tablet TAKE 1 TABLET EVERY DAY 90 tablet 2  . omeprazole (PRILOSEC) 20 MG capsule Take 1 capsule (20 mg total) by mouth daily. 90 capsule 0  . Probiotic Product (PROBIOTIC DAILY PO) Take by mouth.    . simvastatin (ZOCOR) 10 MG tablet Take 1 tablet (10 mg total) by mouth 2 (two) times a week. 90 tablet 1   No current facility-administered medications on file prior to visit.     PAST MEDICAL HISTORY: Past Medical History:  Diagnosis Date  . Acute blood loss anemia   . Arthritis   . Back pain   . Bilious vomiting with  nausea   . Constipation   . GERD (gastroesophageal reflux disease)   . HLD (hyperlipidemia)   . Hypertension   . Hypothyroid   . Joint pain   . Leg edema   . Leukocytosis   . Multiple food allergies   . Myalgia due to statin 11/23/2018  . Osteoarthritis of hip    Right  . Postmenopausal hormone replacement therapy 06/17/2015  . Prediabetes   . Sessile colonic polyp 10/04/2017   Colonoscopy 12/2015, Dr. Fuller Plan, repeat q 5 years.    PAST SURGICAL HISTORY: Past Surgical History:  Procedure Laterality Date  . EYE SURGERY  1987  . HERNIA REPAIR    . THYROID LOBECTOMY     right side  . 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 Smoking status: Current Every Day Smoker    Packs/day: 0.25    Types: Cigarettes  . Smokeless tobacco: Current User  Substance Use Topics  . Alcohol use: Yes    Comment: rarely  . Drug use: No    FAMILY HISTORY: Family History   Problem Relation Age of Onset  . Arthritis Mother   . Breast cancer Mother   . Schizophrenia Mother   . Diabetes Father   . Heart disease Father   . Kidney disease Father   . Stroke Sister   . Lung cancer Brother   . Heart disease Brother   . Lung cancer Brother   . Heart disease Brother   . Vision loss Brother        Glaucoma  . Alcohol abuse Brother     ROS: Review of Systems  Constitutional: Negative for weight loss.  Cardiovascular: Negative for orthopnea.  Gastrointestinal: Negative for nausea and vomiting.  Musculoskeletal:       Negative muscle weakness  Psychiatric/Behavioral: Positive for depression. Negative for suicidal ideas.    PHYSICAL EXAM: Pt in no acute distress  RECENT LABS AND TESTS: BMET    Component Value Date/Time   NA 140 04/14/2019 1352   NA 140 11/01/2018 1306   K 3.7 04/14/2019 1352   CL 108 04/14/2019 1352   CO2 22 04/14/2019 1352   GLUCOSE 84 04/14/2019 1352   BUN 16 04/14/2019 1352   BUN 17 11/01/2018 1306   CREATININE 1.30 (H) 04/14/2019 1352   CALCIUM 9.0 04/14/2019 1352   GFRNONAA 41 (L) 04/14/2019 1352   GFRAA 48 (L) 04/14/2019 1352   Lab Results  Component Value Date   HGBA1C 5.7 (H) 11/01/2018   HGBA1C 5.8 (H) 06/13/2018   HGBA1C 6.4 01/17/2018   HGBA1C 6.1 09/23/2015   Lab Results  Component Value Date   INSULIN 11.7 11/01/2018   INSULIN 15.0 06/13/2018   INSULIN 14.0 02/22/2018   CBC    Component Value Date/Time   WBC 8.2 04/14/2019 1352   RBC 4.52 04/14/2019 1352   HGB 13.9 04/14/2019 1352   HGB 14.5 11/01/2018 1306   HCT 42.4 04/14/2019 1352   HCT 43.0 11/01/2018 1306   PLT 250 04/14/2019 1352   PLT 263 02/22/2018 0936   MCV 93.8 04/14/2019 1352   MCV 90 11/01/2018 1306   MCH 30.8 04/14/2019 1352   MCHC 32.8 04/14/2019 1352   RDW 13.4 04/14/2019 1352   RDW 13.6 11/01/2018 1306   LYMPHSABS 1.4 04/14/2019 1352  LYMPHSABS 1.7 11/01/2018 1306   MONOABS 0.7 04/14/2019 1352   EOSABS 0.1 04/14/2019 1352    EOSABS 0.2 11/01/2018 1306   BASOSABS 0.0 04/14/2019 1352   BASOSABS 0.1 11/01/2018 1306   Iron/TIBC/Ferritin/ %Sat No results found for: IRON, TIBC, FERRITIN, IRONPCTSAT Lipid Panel     Component Value Date/Time   CHOL 222 (H) 11/01/2018 1306   TRIG 155 (H) 11/01/2018 1306   HDL 40 11/01/2018 1306   CHOLHDL 4.9 (H) 02/22/2018 0936   CHOLHDL 7 01/13/2018 0915   VLDL 53.0 (H) 01/13/2018 0915   LDLCALC 151 (H) 11/01/2018 1306   LDLDIRECT 197.0 01/13/2018 0915   Hepatic Function Panel     Component Value Date/Time   PROT 6.6 11/01/2018 1306   ALBUMIN 4.2 11/01/2018 1306   AST 13 11/01/2018 1306   ALT 15 11/01/2018 1306   ALKPHOS 82 11/01/2018 1306   BILITOT 0.6 11/01/2018 1306      Component Value Date/Time   TSH 4.190 11/01/2018 1306   TSH 2.010 06/13/2018 0843   TSH 2.350 02/22/2018 0936      I, Trixie Dredge, am acting as transcriptionist for [name], MD I have reviewed the above documentation for accuracy and completeness, and I agree with the above. -[name], MD

## 2019-05-02 ENCOUNTER — Encounter (INDEPENDENT_AMBULATORY_CARE_PROVIDER_SITE_OTHER): Payer: Self-pay | Admitting: Family Medicine

## 2019-05-02 ENCOUNTER — Other Ambulatory Visit: Payer: Self-pay

## 2019-05-02 ENCOUNTER — Telehealth (INDEPENDENT_AMBULATORY_CARE_PROVIDER_SITE_OTHER): Payer: Medicare HMO | Admitting: Family Medicine

## 2019-05-02 DIAGNOSIS — E559 Vitamin D deficiency, unspecified: Secondary | ICD-10-CM

## 2019-05-02 DIAGNOSIS — F3289 Other specified depressive episodes: Secondary | ICD-10-CM | POA: Diagnosis not present

## 2019-05-02 DIAGNOSIS — Z6835 Body mass index (BMI) 35.0-35.9, adult: Secondary | ICD-10-CM | POA: Diagnosis not present

## 2019-05-02 MED ORDER — VITAMIN D (ERGOCALCIFEROL) 1.25 MG (50000 UNIT) PO CAPS: 50000.0000 [IU] | ORAL_CAPSULE | ORAL | 0 refills | Status: DC

## 2019-05-03 NOTE — Progress Notes (Signed)
Office: [phone]  /  Fax: [phone] TeleHealth Visit:  [name] has verbally consented to this TeleHealth visit today. The patient is located at home, the provider is located at the [employer] [facility] office. The participants in this visit include the listed provider and patient. The visit was conducted today via FaceTime.  HPI:   Chief Complaint: OBESITY Maria Marquez is here to discuss her progress with her obesity treatment plan. She is on the Category 2 plan and is following her eating plan approximately 40-50% of the time. She states she is exercising 0 minutes 0 times per week. Maria Marquez continues to maintain her weight well. She had some celebration eating over the Labor Day weekend but states she is back on track now. We were unable to weigh the patient today for this TeleHealth visit. She feels as if she has maintained her weight since her last visit. She has lost 25 lbs since starting treatment with [name].  Vitamin D deficiency Maria Marquez has a diagnosis of Vitamin D deficiency, which is not yet at goal but improving. She is currently stable on prescription Vit D and denies nausea, vomiting or muscle weakness.  Depression with emotional eating behaviors Maria Marquez is struggling with emotional eating and using food for comfort to the extent that it is negatively impacting her health. She often snacks when she is not hungry. December sometimes feels she is out of control and then feels guilty that she made poor food choices. She has been working on behavior modification techniques to help reduce her emotional eating and has been somewhat successful. [name]'s mood is stable overall. She is still bored and feeling somewhat isolated, but she was able to see family at a distance and this helped her mood. She shows no sign of suicidal or homicidal ideations.  Depression screen [name] 2/9 11/23/2018 02/22/2018 12/12/2017 10/04/2017  Decreased Interest 0 3 3 0  Down, Depressed, Hopeless 0 3 2 0  PHQ - 2  Score 0 6 5 0  Altered sleeping - 2 3 -  Tired, decreased energy - 2 2 -  Change in appetite - 2 2 -  Feeling bad or failure about yourself  - 1 0 -  Trouble concentrating - 0 1 -  Moving slowly or fidgety/restless - 0 0 -  Suicidal thoughts - 0 0 -  PHQ-9 Score - 13 13 -  Difficult doing work/chores - Somewhat difficult Somewhat difficult -   ASSESSMENT AND PLAN:  Other depression  Vitamin D deficiency - Plan: Vitamin D, Ergocalciferol, (DRISDOL) 1.25 MG (50000 UT) CAPS capsule  Class 2 severe obesity with serious comorbidity and body mass index (BMI) of 35.0 to 35.9 in adult, unspecified obesity type ([facility])  PLAN:  Vitamin D Deficiency [name] was informed that low Vitamin D levels contributes to fatigue and are associated with obesity, breast, and colon cancer. She agrees to continue to take prescription Vit D @ 50,000 IU every week #4 with 0 refills and will follow-up for routine testing of Vitamin D, at least 2-3 times per year. She was informed of the risk of over-replacement of Vitamin D and agrees to not increase her dose unless she discusses this with [name] first. Tomeeka agrees to follow-up with our clinic in 2-3 weeks.  Depression with Emotional Eating Behaviors We discussed behavior modification techniques today to help Maria Marquez deal with her emotional eating and depression. Maria Marquez will continue Wellbutrin as is and follow-up with [name] as directed to monitor her progress.  Obesity Maria Marquez is  currently in the action stage of change. As such, her goal is to continue with weight loss efforts. She has agreed to follow the Category 2 plan. Maria Marquez has been instructed to work up to a goal of 150 minutes of combined cardio and strengthening exercise per week for weight loss and overall health benefits. We discussed the following Behavioral Modification Strategies today: emotional eating strategies, dealing with family or coworker sabotage, and holiday eating strategies.   Maria Marquez has agreed to  follow-up with our clinic in 2-3 weeks. She was informed of the importance of frequent follow-up visits to maximize her success with intensive lifestyle modifications for her multiple health conditions.  ALLERGIES: Allergies  Allergen Reactions  . Rosuvastatin Other (See Comments)    Muscle Pain in Thighs  . Shellfish Allergy Hives  . Sulfa Antibiotics Nausea Only  . Latex Rash  . Lisinopril Cough    MEDICATIONS: Current Outpatient Medications on File Prior to Visit  Medication Sig Dispense Refill  . albuterol (VENTOLIN HFA) 108 (90 Base) MCG/ACT inhaler Inhale 1-2 puffs into the lungs every 6 (six) hours as needed for wheezing or shortness of breath. 18 g 0  . aspirin 81 MG chewable tablet Chew by mouth daily.    Maria Marquez buPROPion (WELLBUTRIN SR) 150 MG 12 hr tablet Take 1 tablet (150 mg total) by mouth 2 (two) times daily. 60 tablet 0  . co-enzyme Q-10 30 MG capsule Take 1 capsule (30 mg total) by mouth daily.    Maria Marquez EPINEPHrine (EPIPEN 2-PAK) 0.3 mg/0.3 mL IJ SOAJ injection as needed.    Maria Marquez estradiol-norethindrone (ACTIVELLA) 1-0.5 MG tablet TAKE ONE TABLET BY MOUTH ONE TIME DAILY  84 tablet 0  . ezetimibe (ZETIA) 10 MG tablet TAKE 1 TABLET EVERY DAY 90 tablet 2  . levothyroxine (SYNTHROID, LEVOTHROID) 137 MCG tablet Take 1 tablet (137 mcg total) by mouth at bedtime. 90 tablet 3  . losartan-hydrochlorothiazide (HYZAAR) 50-12.5 MG tablet TAKE 1 TABLET EVERY DAY 90 tablet 2  . omeprazole (PRILOSEC) 20 MG capsule Take 1 capsule (20 mg total) by mouth daily. 90 capsule 0  . Probiotic Product (PROBIOTIC DAILY PO) Take by mouth.    . simvastatin (ZOCOR) 10 MG tablet Take 1 tablet (10 mg total) by mouth 2 (two) times a week. 90 tablet 1   No current facility-administered medications on file prior to visit.     PAST MEDICAL HISTORY: Past Medical History:  Diagnosis Date  . Acute blood loss anemia   . Arthritis   . Back pain   . Bilious vomiting with nausea   . Constipation   . GERD  (gastroesophageal reflux disease)   . HLD (hyperlipidemia)   . Hypertension   . Hypothyroid   . Joint pain   . Leg edema   . Leukocytosis   . Multiple food allergies   . Myalgia due to statin 11/23/2018  . Osteoarthritis of hip    Right  . Postmenopausal hormone replacement therapy 06/17/2015  . Prediabetes   . Sessile colonic polyp 10/04/2017   Colonoscopy 12/2015, Dr. Fuller Plan, repeat q 5 years.    PAST SURGICAL HISTORY: Past Surgical History:  Procedure Laterality Date  . EYE SURGERY  1987  . HERNIA REPAIR    . THYROID LOBECTOMY     right side  . TOTAL HIP ARTHROPLASTY Right 01/28/2016   Procedure: RIGHT TOTAL HIP ARTHROPLASTY ANTERIOR APPROACH;  Surgeon: [name], MD;  Location: [facility];  Service: Orthopedics;  Laterality: Right;    SOCIAL HISTORY:  Social History   Tobacco Use  . Smoking status: Current Every Day Smoker    Packs/day: 0.25    Types: Cigarettes  . Smokeless tobacco: Current User  Substance Use Topics  . Alcohol use: Yes    Comment: rarely  . Drug use: No    FAMILY HISTORY: Family History  Problem Relation Age of Onset  . Arthritis Mother   . Breast cancer Mother   . Schizophrenia Mother   . Diabetes Father   . Heart disease Father   . Kidney disease Father   . Stroke Sister   . Lung cancer Brother   . Heart disease Brother   . Lung cancer Brother   . Heart disease Brother   . Vision loss Brother        Glaucoma  . Alcohol abuse Brother    ROS: Review of Systems  Gastrointestinal: Negative for nausea and vomiting.  Musculoskeletal:       Negative for muscle weakness.  Psychiatric/Behavioral: Positive for depression. Negative for suicidal ideas.       Negative for homicidal ideas.   PHYSICAL EXAM: Pt in no acute distress  RECENT LABS AND TESTS: BMET    Component Value Date/Time   NA 140 04/14/2019 1352   NA 140 11/01/2018 1306   K 3.7 04/14/2019 1352   CL 108 04/14/2019 1352   CO2 22 04/14/2019 1352   GLUCOSE 84 04/14/2019  1352   BUN 16 04/14/2019 1352   BUN 17 11/01/2018 1306   CREATININE 1.30 (H) 04/14/2019 1352   CALCIUM 9.0 04/14/2019 1352   GFRNONAA 41 (L) 04/14/2019 1352   GFRAA 48 (L) 04/14/2019 1352   Lab Results  Component Value Date   HGBA1C 5.7 (H) 11/01/2018   HGBA1C 5.8 (H) 06/13/2018   HGBA1C 6.4 01/17/2018   HGBA1C 6.1 09/23/2015   Lab Results  Component Value Date   INSULIN 11.7 11/01/2018   INSULIN 15.0 06/13/2018   INSULIN 14.0 02/22/2018   CBC    Component Value Date/Time   WBC 8.2 04/14/2019 1352   RBC 4.52 04/14/2019 1352   HGB 13.9 04/14/2019 1352   HGB 14.5 11/01/2018 1306   HCT 42.4 04/14/2019 1352   HCT 43.0 11/01/2018 1306   PLT 250 04/14/2019 1352   PLT 263 02/22/2018 0936   MCV 93.8 04/14/2019 1352   MCV 90 11/01/2018 1306   MCH 30.8 04/14/2019 1352   MCHC 32.8 04/14/2019 1352   RDW 13.4 04/14/2019 1352   RDW 13.6 11/01/2018 1306   LYMPHSABS 1.4 04/14/2019 1352   LYMPHSABS 1.7 11/01/2018 1306   MONOABS 0.7 04/14/2019 1352   EOSABS 0.1 04/14/2019 1352   EOSABS 0.2 11/01/2018 1306   BASOSABS 0.0 04/14/2019 1352   BASOSABS 0.1 11/01/2018 1306   Iron/TIBC/Ferritin/ %Sat No results found for: IRON, TIBC, FERRITIN, IRONPCTSAT Lipid Panel     Component Value Date/Time   CHOL 222 (H) 11/01/2018 1306   TRIG 155 (H) 11/01/2018 1306   HDL 40 11/01/2018 1306   CHOLHDL 4.9 (H) 02/22/2018 0936   CHOLHDL 7 01/13/2018 0915   VLDL 53.0 (H) 01/13/2018 0915   LDLCALC 151 (H) 11/01/2018 1306   LDLDIRECT 197.0 01/13/2018 0915   Hepatic Function Panel     Component Value Date/Time   PROT 6.6 11/01/2018 1306   ALBUMIN 4.2 11/01/2018 1306   AST 13 11/01/2018 1306   ALT 15 11/01/2018 1306   ALKPHOS 82 11/01/2018 1306   BILITOT 0.6 11/01/2018 1306      Component  Value Date/Time   TSH 4.190 11/01/2018 1306   TSH 2.010 06/13/2018 0843   TSH 2.350 02/22/2018 0936   Results for RICHELL, CORKER "MONA AYARS" (MRN [id number]) as of 05/03/2019 09:29  Ref. Range  11/01/2018 13:06  Vitamin D, 25-Hydroxy Latest Ref Range: 30.0 - 100.0 ng/mL 42.6   I, Michaelene Song, am acting as Location manager for [name], MD I have reviewed the above documentation for accuracy and completeness, and I agree with the above. -[name], MD

## 2019-05-09 ENCOUNTER — Other Ambulatory Visit (INDEPENDENT_AMBULATORY_CARE_PROVIDER_SITE_OTHER): Payer: Self-pay | Admitting: Family Medicine

## 2019-05-09 DIAGNOSIS — E559 Vitamin D deficiency, unspecified: Secondary | ICD-10-CM

## 2019-05-11 ENCOUNTER — Other Ambulatory Visit (INDEPENDENT_AMBULATORY_CARE_PROVIDER_SITE_OTHER): Payer: Self-pay | Admitting: Family Medicine

## 2019-05-11 DIAGNOSIS — F3289 Other specified depressive episodes: Secondary | ICD-10-CM

## 2019-05-23 ENCOUNTER — Telehealth (INDEPENDENT_AMBULATORY_CARE_PROVIDER_SITE_OTHER): Payer: Medicare HMO | Admitting: Family Medicine

## 2019-05-23 ENCOUNTER — Encounter (INDEPENDENT_AMBULATORY_CARE_PROVIDER_SITE_OTHER): Payer: Self-pay | Admitting: Family Medicine

## 2019-05-23 ENCOUNTER — Other Ambulatory Visit: Payer: Self-pay

## 2019-05-23 DIAGNOSIS — F3289 Other specified depressive episodes: Secondary | ICD-10-CM

## 2019-05-23 DIAGNOSIS — Z6835 Body mass index (BMI) 35.0-35.9, adult: Secondary | ICD-10-CM | POA: Diagnosis not present

## 2019-05-23 MED ORDER — BUPROPION HCL ER (SR) 200 MG PO TB12: 200.0000 mg | ORAL_TABLET | Freq: Two times a day (BID) | ORAL | 0 refills | Status: DC

## 2019-05-23 NOTE — Progress Notes (Signed)
Office: [phone]  /  Fax: [phone] TeleHealth Visit:  Maria Marquez has verbally consented to this TeleHealth visit today. The patient is located at home, the provider is located at the [employer] [facility] office. The participants in this visit include the listed provider and patient. The visit was conducted today via FaceTime.  HPI:   Chief Complaint: OBESITY Maria Marquez is here to discuss her progress with her obesity treatment plan. She is on the Category 2 plan and is following her eating plan approximately 30-40% of the time. She states she is exercising 0 minutes 0 times per week. Maria Marquez has not been doing well with weight loss. Her depression is worsening and she has lost interest in trying to lose weight. In fact, she suspects she has gained most of her weight back. We were unable to weigh the patient today for this TeleHealth visit. She feels as if she has gained weight since her last visit. She has lost 25 lbs since starting treatment with [name].  Depression with emotional eating behaviors Maria Marquez is struggling with emotional eating and using food for comfort to the extent that it is negatively impacting her health. She often snacks when she is not hungry. Maria Marquez sometimes feels she is out of control and then feels guilty that she made poor food choices. She has been working on behavior modification techniques to help reduce her emotional eating and has been somewhat successful. Maria Marquez's mood is still low and she is very discouraged. She is struggling with isolation and feels she has gained a lot of her weight back. She shows no sign of suicidal or homicidal ideations.  Depression screen [facility] 2/9 11/23/2018 02/22/2018 12/12/2017 10/04/2017  Decreased Interest 0 3 3 0  Down, Depressed, Hopeless 0 3 2 0  PHQ - 2 Score 0 6 5 0  Altered sleeping - 2 3 -  Tired, decreased energy - 2 2 -  Change in appetite - 2 2 -  Feeling bad or failure about yourself  - 1 0 -  Trouble concentrating - 0 1  -  Moving slowly or fidgety/restless - 0 0 -  Suicidal thoughts - 0 0 -  PHQ-9 Score - 13 13 -  Difficult doing work/chores - Somewhat difficult Somewhat difficult -   ASSESSMENT AND PLAN:  Class 2 severe obesity with serious comorbidity and body mass index (BMI) of 35.0 to 35.9 in adult, unspecified obesity type (HCC)  Other depression - with emotional eating - Plan: buPROPion (WELLBUTRIN SR) 200 MG 12 hr tablet  PLAN:  Depression with Emotional Eating Behaviors We discussed behavior modification techniques today to help Maria Marquez deal with her emotional eating and depression. Maria Marquez will increase her Wellbutrin SR to 200 mg #60 with 0 refills and agrees to follow-up with our clinic in 3 weeks.  I spent > than 50% of the 25 minute visit on counseling as documented in the note.  Obesity Maria Marquez is currently in the action stage of change. As such, her goal is to try to maintain her weight while working to improve her depression. She has agreed to portion control better and make smarter food choices, such as increase vegetables and decrease simple carbohydrates.  Maria Marquez has been instructed to work up to a goal of 150 minutes of combined cardio and strengthening exercise per week for weight loss and overall health benefits. We discussed the following Behavioral Modification Strategies today: increasing lean protein intake and increasing vegetables.  Maria Marquez has agreed to follow-up with our clinic  in 3 weeks. She was informed of the importance of frequent follow-up visits to maximize her success with intensive lifestyle modifications for her multiple health conditions.  ALLERGIES: Allergies  Allergen Reactions  . Rosuvastatin Other (See Comments)    Muscle Pain in Thighs  . Shellfish Allergy Hives  . Sulfa Antibiotics Nausea Only  . Latex Rash  . Lisinopril Cough    MEDICATIONS: Current Outpatient Medications on File Prior to Visit  Medication Sig Dispense Refill  . albuterol (VENTOLIN  HFA) 108 (90 Base) MCG/ACT inhaler Inhale 1-2 puffs into the lungs every 6 (six) hours as needed for wheezing or shortness of breath. 18 g 0  . aspirin 81 MG chewable tablet Chew by mouth daily.    [name] co-enzyme Q-10 30 MG capsule Take 1 capsule (30 mg total) by mouth daily.    [name] EPINEPHrine (EPIPEN 2-PAK) 0.3 mg/0.3 mL IJ SOAJ injection as needed.    [name] estradiol-norethindrone (ACTIVELLA) 1-0.5 MG tablet TAKE ONE TABLET BY MOUTH ONE TIME DAILY  84 tablet 0  . ezetimibe (ZETIA) 10 MG tablet TAKE 1 TABLET EVERY DAY 90 tablet 2  . levothyroxine (SYNTHROID, LEVOTHROID) 137 MCG tablet Take 1 tablet (137 mcg total) by mouth at bedtime. 90 tablet 3  . losartan-hydrochlorothiazide (HYZAAR) 50-12.5 MG tablet TAKE 1 TABLET EVERY DAY 90 tablet 2  . omeprazole (PRILOSEC) 20 MG capsule Take 1 capsule (20 mg total) by mouth daily. 90 capsule 0  . Probiotic Product (PROBIOTIC DAILY PO) Take by mouth.    . simvastatin (ZOCOR) 10 MG tablet Take 1 tablet (10 mg total) by mouth 2 (two) times a week. 90 tablet 1  . Vitamin D, Ergocalciferol, (DRISDOL) 1.25 MG (50000 UT) CAPS capsule TAKE 1 CAPSULE (50,000 UNITS TOTAL) BY MOUTH EVERY 7 (SEVEN) DAYS. 4 capsule 0   No current facility-administered medications on file prior to visit.     PAST MEDICAL HISTORY: Past Medical History:  Diagnosis Date  . Acute blood loss anemia   . Arthritis   . Back pain   . Bilious vomiting with nausea   . Constipation   . GERD (gastroesophageal reflux disease)   . HLD (hyperlipidemia)   . Hypertension   . Hypothyroid   . Joint pain   . Leg edema   . Leukocytosis   . Multiple food allergies   . Myalgia due to statin 11/23/2018  . Osteoarthritis of hip    Right  . Postmenopausal hormone replacement therapy 06/17/2015  . Prediabetes   . Sessile colonic polyp 10/04/2017   Colonoscopy 12/2015, Dr. Fuller Plan, repeat q 5 years.    PAST SURGICAL HISTORY: Past Surgical History:  Procedure Laterality Date  . EYE SURGERY  1987  .  HERNIA REPAIR    . THYROID LOBECTOMY     right side  . 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 Smoking status: Current Every Day Smoker    Packs/day: 0.25    Types: Cigarettes  . Smokeless tobacco: Current User  Substance Use Topics  . Alcohol use: Yes    Comment: rarely  . Drug use: No    FAMILY HISTORY: Family History  Problem Relation Age of Onset  . Arthritis Mother   . Breast cancer Mother   . Schizophrenia Mother   . Diabetes Father   .  Heart disease Father   . Kidney disease Father   . Stroke Sister   . Lung cancer Brother   . Heart disease Brother   . Lung cancer Brother   . Heart disease Brother   . Vision loss Brother        Glaucoma  . Alcohol abuse Brother    ROS: Review of Systems  Psychiatric/Behavioral: Positive for depression. Negative for suicidal ideas.       Negative for homicidal ideas.   PHYSICAL EXAM: Pt in no acute distress  RECENT LABS AND TESTS: BMET    Component Value Date/Time   NA 140 04/14/2019 1352   NA 140 11/01/2018 1306   K 3.7 04/14/2019 1352   CL 108 04/14/2019 1352   CO2 22 04/14/2019 1352   GLUCOSE 84 04/14/2019 1352   BUN 16 04/14/2019 1352   BUN 17 11/01/2018 1306   CREATININE 1.30 (H) 04/14/2019 1352   CALCIUM 9.0 04/14/2019 1352   GFRNONAA 41 (L) 04/14/2019 1352   GFRAA 48 (L) 04/14/2019 1352   Lab Results  Component Value Date   HGBA1C 5.7 (H) 11/01/2018   HGBA1C 5.8 (H) 06/13/2018   HGBA1C 6.4 01/17/2018   HGBA1C 6.1 09/23/2015   Lab Results  Component Value Date   INSULIN 11.7 11/01/2018   INSULIN 15.0 06/13/2018   INSULIN 14.0 02/22/2018   CBC    Component Value Date/Time   WBC 8.2 04/14/2019 1352   RBC 4.52 04/14/2019 1352   HGB 13.9 04/14/2019 1352   HGB 14.5 11/01/2018 1306   HCT 42.4  04/14/2019 1352   HCT 43.0 11/01/2018 1306   PLT 250 04/14/2019 1352   PLT 263 02/22/2018 0936   MCV 93.8 04/14/2019 1352   MCV 90 11/01/2018 1306   MCH 30.8 04/14/2019 1352   MCHC 32.8 04/14/2019 1352   RDW 13.4 04/14/2019 1352   RDW 13.6 11/01/2018 1306   LYMPHSABS 1.4 04/14/2019 1352   LYMPHSABS 1.7 11/01/2018 1306   MONOABS 0.7 04/14/2019 1352   EOSABS 0.1 04/14/2019 1352   EOSABS 0.2 11/01/2018 1306   BASOSABS 0.0 04/14/2019 1352   BASOSABS 0.1 11/01/2018 1306   Iron/TIBC/Ferritin/ %Sat No results found for: IRON, TIBC, FERRITIN, IRONPCTSAT Lipid Panel     Component Value Date/Time   CHOL 222 (H) 11/01/2018 1306   TRIG 155 (H) 11/01/2018 1306   HDL 40 11/01/2018 1306   CHOLHDL 4.9 (H) 02/22/2018 0936   CHOLHDL 7 01/13/2018 0915   VLDL 53.0 (H) 01/13/2018 0915   LDLCALC 151 (H) 11/01/2018 1306   LDLDIRECT 197.0 01/13/2018 0915   Hepatic Function Panel     Component Value Date/Time   PROT 6.6 11/01/2018 1306   ALBUMIN 4.2 11/01/2018 1306   AST 13 11/01/2018 1306   ALT 15 11/01/2018 1306   ALKPHOS 82 11/01/2018 1306   BILITOT 0.6 11/01/2018 1306      Component Value Date/Time   TSH 4.190 11/01/2018 1306   TSH 2.010 06/13/2018 0843   TSH 2.350 02/22/2018 0936   Results for Zaylin, Pistilli Eman "ARMA REINING" (MRN [id number]) as of 05/23/2019 15:47  Ref. Range 11/01/2018 13:06  Vitamin D, 25-Hydroxy Latest Ref Range: 30.0 - 100.0 ng/mL 42.6   I, Michaelene Song, am acting as Location manager for [name], MD  I have reviewed the above documentation for accuracy and completeness, and I agree with the above. -[name], MD

## 2019-05-25 DIAGNOSIS — J3089 Other allergic rhinitis: Secondary | ICD-10-CM | POA: Diagnosis not present

## 2019-05-25 DIAGNOSIS — J301 Allergic rhinitis due to pollen: Secondary | ICD-10-CM | POA: Diagnosis not present

## 2019-05-25 DIAGNOSIS — J3081 Allergic rhinitis due to animal (cat) (dog) hair and dander: Secondary | ICD-10-CM | POA: Diagnosis not present

## 2019-05-29 ENCOUNTER — Other Ambulatory Visit: Payer: Self-pay | Admitting: Family Medicine

## 2019-06-04 DIAGNOSIS — J301 Allergic rhinitis due to pollen: Secondary | ICD-10-CM | POA: Diagnosis not present

## 2019-06-04 DIAGNOSIS — J3081 Allergic rhinitis due to animal (cat) (dog) hair and dander: Secondary | ICD-10-CM | POA: Diagnosis not present

## 2019-06-04 DIAGNOSIS — J3089 Other allergic rhinitis: Secondary | ICD-10-CM | POA: Diagnosis not present

## 2019-06-06 ENCOUNTER — Other Ambulatory Visit (INDEPENDENT_AMBULATORY_CARE_PROVIDER_SITE_OTHER): Payer: Self-pay | Admitting: Family Medicine

## 2019-06-06 DIAGNOSIS — E559 Vitamin D deficiency, unspecified: Secondary | ICD-10-CM

## 2019-06-11 DIAGNOSIS — J3089 Other allergic rhinitis: Secondary | ICD-10-CM | POA: Diagnosis not present

## 2019-06-11 DIAGNOSIS — J3081 Allergic rhinitis due to animal (cat) (dog) hair and dander: Secondary | ICD-10-CM | POA: Diagnosis not present

## 2019-06-11 DIAGNOSIS — J301 Allergic rhinitis due to pollen: Secondary | ICD-10-CM | POA: Diagnosis not present

## 2019-06-13 ENCOUNTER — Telehealth (INDEPENDENT_AMBULATORY_CARE_PROVIDER_SITE_OTHER): Payer: Medicare HMO | Admitting: Family Medicine

## 2019-06-13 ENCOUNTER — Encounter (INDEPENDENT_AMBULATORY_CARE_PROVIDER_SITE_OTHER): Payer: Self-pay | Admitting: Family Medicine

## 2019-06-13 ENCOUNTER — Other Ambulatory Visit: Payer: Self-pay

## 2019-06-13 DIAGNOSIS — F3289 Other specified depressive episodes: Secondary | ICD-10-CM | POA: Diagnosis not present

## 2019-06-13 DIAGNOSIS — Z6835 Body mass index (BMI) 35.0-35.9, adult: Secondary | ICD-10-CM | POA: Diagnosis not present

## 2019-06-13 DIAGNOSIS — E559 Vitamin D deficiency, unspecified: Secondary | ICD-10-CM | POA: Diagnosis not present

## 2019-06-13 MED ORDER — BUPROPION HCL ER (SR) 200 MG PO TB12: 200.0000 mg | ORAL_TABLET | Freq: Two times a day (BID) | ORAL | 0 refills | Status: DC

## 2019-06-13 MED ORDER — VITAMIN D (ERGOCALCIFEROL) 1.25 MG (50000 UNIT) PO CAPS: 50000.0000 [IU] | ORAL_CAPSULE | ORAL | 0 refills | Status: DC

## 2019-06-14 ENCOUNTER — Other Ambulatory Visit: Payer: Self-pay

## 2019-06-14 ENCOUNTER — Ambulatory Visit (INDEPENDENT_AMBULATORY_CARE_PROVIDER_SITE_OTHER): Payer: Medicare HMO | Admitting: Family Medicine

## 2019-06-14 ENCOUNTER — Encounter: Payer: Self-pay | Admitting: Family Medicine

## 2019-06-14 VITALS — BP 120/70 | HR 78 | Temp 97.8°F | Resp 16 | Ht 66.0 in | Wt 235.2 lb

## 2019-06-14 DIAGNOSIS — M791 Myalgia, unspecified site: Secondary | ICD-10-CM

## 2019-06-14 DIAGNOSIS — T466X5A Adverse effect of antihyperlipidemic and antiarteriosclerotic drugs, initial encounter: Secondary | ICD-10-CM

## 2019-06-14 DIAGNOSIS — Z23 Encounter for immunization: Secondary | ICD-10-CM | POA: Diagnosis not present

## 2019-06-14 DIAGNOSIS — Z79890 Hormone replacement therapy (postmenopausal): Secondary | ICD-10-CM

## 2019-06-14 DIAGNOSIS — E782 Mixed hyperlipidemia: Secondary | ICD-10-CM | POA: Diagnosis not present

## 2019-06-14 DIAGNOSIS — E890 Postprocedural hypothyroidism: Secondary | ICD-10-CM | POA: Diagnosis not present

## 2019-06-14 DIAGNOSIS — F17200 Nicotine dependence, unspecified, uncomplicated: Secondary | ICD-10-CM | POA: Diagnosis not present

## 2019-06-14 DIAGNOSIS — I10 Essential (primary) hypertension: Secondary | ICD-10-CM | POA: Diagnosis not present

## 2019-06-14 DIAGNOSIS — Z0000 Encounter for general adult medical examination without abnormal findings: Secondary | ICD-10-CM

## 2019-06-14 LAB — CBC WITH DIFFERENTIAL/PLATELET
Basophils Absolute: 0 10*3/uL (ref 0.0–0.1)
Basophils Relative: 0.4 % (ref 0.0–3.0)
Eosinophils Absolute: 0.2 10*3/uL (ref 0.0–0.7)
Eosinophils Relative: 2 % (ref 0.0–5.0)
HCT: 42.3 % (ref 36.0–46.0)
Hemoglobin: 14.3 g/dL (ref 12.0–15.0)
Lymphocytes Relative: 18.2 % (ref 12.0–46.0)
Lymphs Abs: 1.5 10*3/uL (ref 0.7–4.0)
MCHC: 33.9 g/dL (ref 30.0–36.0)
MCV: 95 fl (ref 78.0–100.0)
Monocytes Absolute: 0.7 10*3/uL (ref 0.1–1.0)
Monocytes Relative: 8.3 % (ref 3.0–12.0)
Neutro Abs: 5.9 10*3/uL (ref 1.4–7.7)
Neutrophils Relative %: 71.1 % (ref 43.0–77.0)
Platelets: 247 10*3/uL (ref 150.0–400.0)
RBC: 4.45 Mil/uL (ref 3.87–5.11)
RDW: 14.1 % (ref 11.5–15.5)
WBC: 8.3 10*3/uL (ref 4.0–10.5)

## 2019-06-14 LAB — COMPREHENSIVE METABOLIC PANEL
ALT: 19 U/L (ref 0–35)
AST: 14 U/L (ref 0–37)
Albumin: 4.3 g/dL (ref 3.5–5.2)
Alkaline Phosphatase: 72 U/L (ref 39–117)
BUN: 19 mg/dL (ref 6–23)
CO2: 27 mEq/L (ref 19–32)
Calcium: 9.1 mg/dL (ref 8.4–10.5)
Chloride: 104 mEq/L (ref 96–112)
Creatinine, Ser: 1.53 mg/dL — ABNORMAL HIGH (ref 0.40–1.20)
GFR: 33.42 mL/min — ABNORMAL LOW (ref >60.00)
Glucose, Bld: 128 mg/dL — ABNORMAL HIGH (ref 70–99)
Potassium: 4.4 mEq/L (ref 3.5–5.1)
Sodium: 139 mEq/L (ref 135–145)
Total Bilirubin: 0.8 mg/dL (ref 0.2–1.2)
Total Protein: 6.5 g/dL (ref 6.0–8.3)

## 2019-06-14 LAB — LIPID PANEL
Cholesterol: 199 mg/dL (ref 0–200)
HDL: 40.7 mg/dL (ref >39.00)
LDL Cholesterol: 129 mg/dL — ABNORMAL HIGH (ref 0–99)
NonHDL: 158.75
Total CHOL/HDL Ratio: 5
Triglycerides: 147 mg/dL (ref 0.0–149.0)
VLDL: 29.4 mg/dL (ref 0.0–40.0)

## 2019-06-14 LAB — TSH: TSH: 2.81 u[IU]/mL (ref 0.35–4.50)

## 2019-06-14 NOTE — Patient Instructions (Signed)
Please return in 6 months for follow up of your hypertension.  I will release your lab results to you on your MyChart account with further instructions. Please reply with any questions.   Stay safe but live!  If you have any questions or concerns, please don't hesitate to send me a message via MyChart or call the office at [phone]. Thank you for visiting with [name] today! It's our pleasure caring for you.   Preventive Care 36 Years and Older, Female Preventive care refers to lifestyle choices and visits with your health care provider that can promote health and wellness. This includes:  A yearly physical exam. This is also called an annual well check.  Regular dental and eye exams.  Immunizations.  Screening for certain conditions.  Healthy lifestyle choices, such as diet and exercise. What can I expect for my preventive care visit? Physical exam Your health care provider will check:  Height and weight. These may be used to calculate body mass index (BMI), which is a measurement that tells if you are at a healthy weight.  Heart rate and blood pressure.  Your skin for abnormal spots. Counseling Your health care provider may ask you questions about:  Alcohol, tobacco, and drug use.  Emotional well-being.  Home and relationship well-being.  Sexual activity.  Eating habits.  History of falls.  Memory and ability to understand (cognition).  Work and work Statistician.  Pregnancy and menstrual history. What immunizations do I need?  Influenza (flu) vaccine  This is recommended every year. Tetanus, diphtheria, and pertussis (Tdap) vaccine  You may need a Td booster every 10 years. Varicella (chickenpox) vaccine  You may need this vaccine if you have not already been vaccinated. Zoster (shingles) vaccine  You may need this after age 32. Pneumococcal conjugate (PCV13) vaccine  One dose is recommended after age 100. Pneumococcal polysaccharide (PPSV23) vaccine   One dose is recommended after age 39. Measles, mumps, and rubella (MMR) vaccine  You may need at least one dose of MMR if you were born in 1957 or later. You may also need a second dose. Meningococcal conjugate (MenACWY) vaccine  You may need this if you have certain conditions. Hepatitis A vaccine  You may need this if you have certain conditions or if you travel or work in places where you may be exposed to hepatitis A. Hepatitis B vaccine  You may need this if you have certain conditions or if you travel or work in places where you may be exposed to hepatitis B. Haemophilus influenzae type b (Hib) vaccine  You may need this if you have certain conditions. You may receive vaccines as individual doses or as more than one vaccine together in one shot (combination vaccines). Talk with your health care provider about the risks and benefits of combination vaccines. What tests do I need? Blood tests  Lipid and cholesterol levels. These may be checked every 5 years, or more frequently depending on your overall health.  Hepatitis C test.  Hepatitis B test. Screening  Lung cancer screening. You may have this screening every year starting at age 2 if you have a 30-pack-year history of smoking and currently smoke or have quit within the past 15 years.  Colorectal cancer screening. All adults should have this screening starting at age 5 and continuing until age 43. Your health care provider may recommend screening at age 42 if you are at increased risk. You will have tests every 1-10 years, depending on your results and the  type of screening test.  Diabetes screening. This is done by checking your blood sugar (glucose) after you have not eaten for a while (fasting). You may have this done every 1-3 years.  Mammogram. This may be done every 1-2 years. Talk with your health care provider about how often you should have regular mammograms.  BRCA-related cancer screening. This may be done  if you have a family history of breast, ovarian, tubal, or peritoneal cancers. Other tests  Sexually transmitted disease (STD) testing.  Bone density scan. This is done to screen for osteoporosis. You may have this done starting at age 66. Follow these instructions at home: Eating and drinking  Eat a diet that includes fresh fruits and vegetables, whole grains, lean protein, and low-fat dairy products. Limit your intake of foods with high amounts of sugar, saturated fats, and salt.  Take vitamin and mineral supplements as recommended by your health care provider.  Do not drink alcohol if your health care provider tells you not to drink.  If you drink alcohol: ? Limit how much you have to 0-1 drink a day. ? Be aware of how much alcohol is in your drink. In the [place] one drink equals one 12 oz bottle of beer (355 mL), one 5 oz glass of wine (148 mL), or one 1 oz glass of hard liquor (44 mL). Lifestyle  Take daily care of your teeth and gums.  Stay active. Exercise for at least 30 minutes on 5 or more days each week.  Do not use any products that contain nicotine or tobacco, such as cigarettes, e-cigarettes, and chewing tobacco. If you need help quitting, ask your health care provider.  If you are sexually active, practice safe sex. Use a condom or other form of protection in order to prevent STIs (sexually transmitted infections).  Talk with your health care provider about taking a low-dose aspirin or statin. What's next?  Go to your health care provider once a year for a well check visit.  Ask your health care provider how often you should have your eyes and teeth checked.  Stay up to date on all vaccines. This information is not intended to replace advice given to you by your health care provider. Make sure you discuss any questions you have with your health care provider. Document Released: 09/05/2015 Document Revised: 08/03/2018 Document Reviewed: 08/03/2018 Elsevier Patient  Education  2020 [employer].

## 2019-06-14 NOTE — Progress Notes (Signed)
Office: [phone]  /  Fax: [phone] TeleHealth Visit:  Maria Marquez has verbally consented to this TeleHealth visit today. The patient is located at home, the provider is located at the [employer] [facility] office. The participants in this visit include the listed provider and patient. The visit was conducted today via face time.  HPI:   Chief Complaint: OBESITY Maria Marquez is here to discuss her progress with her obesity treatment plan. She is on the portion control better and make smarter food choices, such as increase vegetables and decrease simple carbohydrates and is following her eating plan approximately 25 % of the time. She states she is doing aerobics and walking for 20 minutes 3 times per week. Maria Marquez feels she has maintained her weight in the last 2-3 weeks. She is still struggling with feeling isolated with COVID19, but she thinks she is doing better overall.  We were unable to weigh the patient today for this TeleHealth visit. She feels as if she has maintained her weight since her last visit. She has lost 25 lbs since starting treatment with [name].  Vitamin D Deficiency Maria Marquez has a diagnosis of vitamin D deficiency. She is stable on prescription Vit D and denies nausea, vomiting or muscle weakness.  Depression with Emotional Eating Behaviors Maria Marquez increased Wellbutrin last visit, and she feels it has helped with her mood and she is sleeping better. She notes waking up while talking in her sleep, but this doesn't bother her much. Maria Marquez struggles with emotional eating and using food for comfort to the extent that it is negatively impacting her health. She often snacks when she is not hungry. Maria Marquez sometimes feels she is out of control and then feels guilty that she made poor food choices. She has been working on behavior modification techniques to help reduce her emotional eating and has been somewhat successful. She shows no sign of suicidal or homicidal ideations.  Depression  screen [facility] 2/9 11/23/2018 02/22/2018 12/12/2017 10/04/2017  Decreased Interest 0 3 3 0  Down, Depressed, Hopeless 0 3 2 0  PHQ - 2 Score 0 6 5 0  Altered sleeping - 2 3 -  Tired, decreased energy - 2 2 -  Change in appetite - 2 2 -  Feeling bad or failure about yourself  - 1 0 -  Trouble concentrating - 0 1 -  Moving slowly or fidgety/restless - 0 0 -  Suicidal thoughts - 0 0 -  PHQ-9 Score - 13 13 -  Difficult doing work/chores - Somewhat difficult Somewhat difficult -    ASSESSMENT AND PLAN:  Vitamin D deficiency - Plan: Vitamin D, Ergocalciferol, (DRISDOL) 1.25 MG (50000 UT) CAPS capsule  Other depression - with emotional eating - Plan: buPROPion (WELLBUTRIN SR) 200 MG 12 hr tablet  Class 2 severe obesity with serious comorbidity and body mass index (BMI) of 35.0 to 35.9 in adult, unspecified obesity type (HCC)  PLAN:  Vitamin D Deficiency [name] was informed that low vitamin D levels contributes to fatigue and are associated with obesity, breast, and colon cancer. Larose agrees to continue taking prescription Vit D 50,000 IU every week #4 and we will refill for 1 month. She will follow up for routine testing of vitamin D, at least 2-3 times per year. She was informed of the risk of over-replacement of vitamin D and agrees to not increase her dose unless she discusses this with [name] first. [name] agrees to follow up with our clinic in 2 weeks.  Depression with  Emotional Eating Behaviors We discussed behavior modification techniques today to help Maria Marquez deal with her emotional eating and depression. [name] agrees to continue taking Wellbutrin SR 200 mg PO BID #60 and we will refill for 1 month. Maria Marquez agrees to follow up with our clinic in 2 weeks.  Obesity [name] is currently in the action stage of change. As such, her goal is to maintain weight for now She has agreed to follow the Category 2 plan [name] has been instructed to work up to a goal of 150 minutes of combined cardio and  strengthening exercise per week for weight loss and overall health benefits. We discussed the following Behavioral Modification Strategies today: emotional eating strategies   Maria Marquez has agreed to follow up with our clinic in 2 weeks. She was informed of the importance of frequent follow up visits to maximize her success with intensive lifestyle modifications for her multiple health conditions.  ALLERGIES: Allergies  Allergen Reactions  . Rosuvastatin Other (See Comments)    Muscle Pain in Thighs  . Shellfish Allergy Hives  . Sulfa Antibiotics Nausea Only  . Latex Rash  . Lisinopril Cough    MEDICATIONS: Current Outpatient Medications on File Prior to Visit  Medication Sig Dispense Refill  . albuterol (VENTOLIN HFA) 108 (90 Base) MCG/ACT inhaler Inhale 1-2 puffs into the lungs every 6 (six) hours as needed for wheezing or shortness of breath. 18 g 0  . aspirin 81 MG chewable tablet Chew by mouth daily.    [name] co-enzyme Q-10 30 MG capsule Take 1 capsule (30 mg total) by mouth daily.    [name] EPINEPHrine (EPIPEN 2-PAK) 0.3 mg/0.3 mL IJ SOAJ injection as needed.    [name] estradiol-norethindrone (ACTIVELLA) 1-0.5 MG tablet TAKE ONE TABLET BY MOUTH ONE TIME DAILY  84 tablet 0  . ezetimibe (ZETIA) 10 MG tablet TAKE 1 TABLET EVERY DAY 90 tablet 2  . levothyroxine (SYNTHROID, LEVOTHROID) 137 MCG tablet Take 1 tablet (137 mcg total) by mouth at bedtime. 90 tablet 3  . losartan-hydrochlorothiazide (HYZAAR) 50-12.5 MG tablet TAKE 1 TABLET EVERY DAY 90 tablet 2  . omeprazole (PRILOSEC) 20 MG capsule Take 1 capsule (20 mg total) by mouth daily. 90 capsule 0  . Probiotic Product (PROBIOTIC DAILY PO) Take by mouth.    . simvastatin (ZOCOR) 10 MG tablet Take 1 tablet (10 mg total) by mouth 2 (two) times a week. 90 tablet 1   No current facility-administered medications on file prior to visit.     PAST MEDICAL HISTORY: Past Medical History:  Diagnosis Date  . Acute blood loss anemia   . Arthritis   .  Back pain   . Bilious vomiting with nausea   . Constipation   . GERD (gastroesophageal reflux disease)   . HLD (hyperlipidemia)   . Hypertension   . Hypothyroid   . Joint pain   . Leg edema   . Leukocytosis   . Multiple food allergies   . Myalgia due to statin 11/23/2018  . Osteoarthritis of hip    Right  . Postmenopausal hormone replacement therapy 06/17/2015  . Prediabetes   . Sessile colonic polyp 10/04/2017   Colonoscopy 12/2015, Dr. Fuller Plan, repeat q 5 years.    PAST SURGICAL HISTORY: Past Surgical History:  Procedure Laterality Date  . EYE SURGERY  1987  . HERNIA REPAIR    . THYROID LOBECTOMY     right side  . 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 Smoking status: Current Every Day Smoker    Packs/day: 0.25    Types: Cigarettes  . Smokeless tobacco: Current User  Substance Use Topics  . Alcohol use: Yes    Comment: rarely  . Drug use: No    FAMILY HISTORY: Family History  Problem Relation Age of Onset  . Arthritis Mother   . Breast cancer Mother   . Schizophrenia Mother   . Diabetes Father   . Heart disease Father   . Kidney disease Father   . Stroke Sister   . Lung cancer Brother   . Heart disease Brother   . Lung cancer Brother   . Heart disease Brother   . Vision loss Brother        Glaucoma  . Alcohol abuse Brother     ROS: Review of Systems  Constitutional: Negative for weight loss.  Gastrointestinal: Negative for nausea and vomiting.  Musculoskeletal:       Negative muscle weakness  Psychiatric/Behavioral: Positive for depression. Negative for suicidal ideas.    PHYSICAL EXAM: Pt in no acute distress  RECENT LABS AND TESTS: BMET    Component Value Date/Time   NA 140 04/14/2019 1352   NA 140 11/01/2018 1306   K 3.7 04/14/2019 1352    CL 108 04/14/2019 1352   CO2 22 04/14/2019 1352   GLUCOSE 84 04/14/2019 1352   BUN 16 04/14/2019 1352   BUN 17 11/01/2018 1306   CREATININE 1.30 (H) 04/14/2019 1352   CALCIUM 9.0 04/14/2019 1352   GFRNONAA 41 (L) 04/14/2019 1352   GFRAA 48 (L) 04/14/2019 1352   Lab Results  Component Value Date   HGBA1C 5.7 (H) 11/01/2018   HGBA1C 5.8 (H) 06/13/2018   HGBA1C 6.4 01/17/2018   HGBA1C 6.1 09/23/2015   Lab Results  Component Value Date   INSULIN 11.7 11/01/2018   INSULIN 15.0 06/13/2018   INSULIN 14.0 02/22/2018   CBC    Component Value Date/Time   WBC 8.2 04/14/2019 1352   RBC 4.52 04/14/2019 1352   HGB 13.9 04/14/2019 1352   HGB 14.5 11/01/2018 1306   HCT 42.4 04/14/2019 1352   HCT 43.0 11/01/2018 1306   PLT 250 04/14/2019 1352   PLT 263 02/22/2018 0936   MCV 93.8 04/14/2019 1352   MCV 90 11/01/2018 1306   MCH 30.8 04/14/2019 1352   MCHC 32.8 04/14/2019 1352   RDW 13.4 04/14/2019 1352   RDW 13.6 11/01/2018 1306   LYMPHSABS 1.4 04/14/2019 1352   LYMPHSABS 1.7 11/01/2018 1306   MONOABS 0.7 04/14/2019 1352   EOSABS 0.1 04/14/2019 1352   EOSABS 0.2 11/01/2018 1306   BASOSABS 0.0 04/14/2019 1352   BASOSABS 0.1 11/01/2018 1306   Iron/TIBC/Ferritin/ %Sat No results found for: IRON, TIBC, FERRITIN, IRONPCTSAT Lipid Panel     Component Value Date/Time   CHOL 222 (H) 11/01/2018 1306   TRIG 155 (H) 11/01/2018 1306   HDL 40 11/01/2018 1306   CHOLHDL 4.9 (H) 02/22/2018 0936   CHOLHDL 7 01/13/2018 0915   VLDL 53.0 (H) 01/13/2018 0915   LDLCALC 151 (H) 11/01/2018 1306   LDLDIRECT 197.0 01/13/2018 0915   Hepatic Function Panel     Component Value Date/Time   PROT 6.6 11/01/2018 1306   ALBUMIN 4.2 11/01/2018 1306   AST 13 11/01/2018 1306   ALT 15 11/01/2018 1306  ALKPHOS 82 11/01/2018 1306   BILITOT 0.6 11/01/2018 1306      Component Value Date/Time   TSH 4.190 11/01/2018 1306   TSH 2.010 06/13/2018 0843   TSH 2.350 02/22/2018 0936      I, Trixie Dredge, am acting as transcriptionist for [name], MD I have reviewed the above documentation for accuracy and completeness, and I agree with the above. -[name], MD

## 2019-06-14 NOTE — Progress Notes (Signed)
Subjective  Chief Complaint  Patient presents with  . Annual Exam    HPI: Maria Marquez is a 73 y.o. female who presents to [facility] today for a Female Wellness Visit. She also has the concerns and/or needs as listed above in the chief complaint. These will be addressed in addition to the Health Maintenance Visit.   Wellness Visit: annual visit with health maintenance review and exam without Pap   HM: screens are up to date. Seeing wellness and healthy weight. Struggling a bit with weight loss in part due to covid pandemic restrictions, loneliness, boredom and stress eating. wellbutrin was recently increased to help. Due flu vaccine Chronic disease f/u and/or acute problem visit: (deemed necessary to be done in addition to the wellness visit):  HTN: reports good control at home. Gets nervous here. bp here in office is thus borderline. Feeling well. Taking medications w/o adverse effects. No symptoms of CHF, angina; no palpitations, sob, cp or lower extremity edema. Compliant with meds.   HLD on zetia and low intensity statin due to h/o myalgias. nonfasting today for recheck.   Hypothyroidism: energy is stable although sleeping more; mostly due to boredom. Compliant with meds.   HRT and smoker; per gyn. Not interested in quitting at this time.    Assessment  1. Annual physical exam   2. Essential hypertension   3. Mixed hyperlipidemia   4. Postoperative hypothyroidism   5. Post-menopause on HRT (hormone replacement therapy)   6. Tobacco dependence   7. Myalgia due to statin      Plan  Female Wellness Visit:  Age appropriate Health Maintenance and Prevention measures were discussed with patient. Included topics are cancer screening recommendations, ways to keep healthy (see AVS) including dietary and exercise recommendations, regular eye and dental care, use of seat belts, and avoidance of moderate alcohol use and tobacco use.   BMI: discussed  patient's BMI and encouraged positive lifestyle modifications to help get to or maintain a target BMI.  HM needs and immunizations were addressed and ordered. See below for orders. See HM and immunization section for updates. Flu shot today  Routine labs and screening tests ordered including cmp, cbc and lipids where appropriate.  Discussed recommendations regarding Vit D and calcium supplementation (see AVS)  Chronic disease management visit and/or acute problem visit:  Chronic problems are controlled. Recheck labs and adjust meds if needed.   Counseling for depression and anxiety done. Continue increased dose of wellbutrin and f/u with Dr. Leafy Ro. Recommend liberalizing her self isolation restrictions to help her emotional wellbeing.   Follow up: Return in about 6 months (around 12/13/2019) for follow up Hypertension.  Orders Placed This Encounter  Procedures  . Comprehensive metabolic panel  . CBC with Differential/Platelet  . Lipid panel  . TSH   No orders of the defined types were placed in this encounter.     Lifestyle: Body mass index is 37.96 kg/m. Wt Readings from Last 3 Encounters:  06/14/19 235 lb 3.2 oz (106.7 kg)  11/01/18 222 lb (100.7 kg)  10/03/18 220 lb (99.8 kg)     Patient Active Problem List   Diagnosis Date Noted  . Tobacco dependence 12/09/2015    Priority: High  . Essential hypertension 06/17/2015    Priority: High    Overview:  Intolerant to multiple medications: hctz, amlodipine, and aldactone (make her feel 'bad')   . Mixed hyperlipidemia 06/17/2015    Priority: High  . Morbid obesity ([place]) 06/17/2015  Priority: High  . Post-menopause on HRT (hormone replacement therapy) 06/17/2015    Priority: High  . Postoperative hypothyroidism 06/17/2015    Priority: High  . Primary osteoarthritis involving multiple joints 06/17/2015    Priority: High  . Sessile colonic polyp 10/04/2017    Priority: Medium    Colonoscopy 12/2015, Dr. Fuller Plan,  repeat q 5 years.   . Status post right hip replacement 07/21/2015    Priority: Medium    Overview:  Severe by xray 06/2015   . ACE inhibitor intolerance 06/17/2015    Priority: Medium  . DJD (degenerative joint disease), lumbar 06/17/2015    Priority: Medium    Overview:  'bulging disc' 2008   . Gastroesophageal reflux disease without esophagitis 06/17/2015    Priority: Medium  . Umbilical hernia 32/35/5732    Priority: Low  . Hematuria 07/28/2015    Priority: Low  . Myalgia due to statin 11/23/2018   Health Maintenance  Topic Date Due  . INFLUENZA VACCINE  03/24/2019  . MAMMOGRAM  10/07/2019  . COLONOSCOPY  12/22/2020  . DEXA SCAN  02/28/2023  . TETANUS/TDAP  06/16/2024  . Hepatitis C Screening  Completed  . PNA vac Low Risk Adult  Completed   Immunization History  Administered Date(s) Administered  . Influenza, High Dose Seasonal PF 04/28/2016, 07/19/2017, 05/19/2018  . Influenza, Seasonal, Injecte, Preservative Fre 04/25/2015  . Influenza-Unspecified 07/12/2017  . Pneumococcal Conjugate-13 06/13/2015  . Pneumococcal Polysaccharide-23 06/10/2016  . Tdap 06/16/2014  . Zoster 05/27/2014   We updated and reviewed the patient's past history in detail and it is documented below. Allergies: Patient is allergic to rosuvastatin; shellfish allergy; sulfa antibiotics; latex; and lisinopril. Past Medical History Patient  has a past medical history of Acute blood loss anemia, Arthritis, Back pain, Bilious vomiting with nausea, Constipation, GERD (gastroesophageal reflux disease), HLD (hyperlipidemia), Hypertension, Hypothyroid, Joint pain, Leg edema, Leukocytosis, Multiple food allergies, Myalgia due to statin (11/23/2018), Osteoarthritis of hip, Postmenopausal hormone replacement therapy (06/17/2015), Prediabetes, and Sessile colonic polyp (10/04/2017). Past Surgical History Patient  has a past surgical history that includes Thyroid lobectomy; Hernia repair; Total hip  arthroplasty (Right, 01/28/2016); and Eye surgery (1987). Family History: Patient family history includes Alcohol abuse in her brother; Arthritis in her mother; Breast cancer in her mother; Diabetes in her father; Heart disease in her brother, brother, and father; Kidney disease in her father; Lung cancer in her brother and brother; Schizophrenia in her mother; Stroke in her sister; Vision loss in her brother. Social History:  Patient  reports that she has been smoking cigarettes. She has been smoking about 0.25 packs per day. She uses smokeless tobacco. She reports current alcohol use. She reports that she does not use drugs.  Review of Systems: Constitutional: negative for fever or malaise Ophthalmic: negative for photophobia, double vision or loss of vision Cardiovascular: negative for chest pain, dyspnea on exertion, or new LE swelling Respiratory: negative for SOB or persistent cough Gastrointestinal: negative for abdominal pain, change in bowel habits or melena Genitourinary: negative for dysuria or gross hematuria, no abnormal uterine bleeding or disharge Musculoskeletal: negative for new gait disturbance or muscular weakness Integumentary: negative for new or persistent rashes, no breast lumps Neurological: negative for TIA or stroke symptoms Psychiatric: negative for SI or delusions Allergic/Immunologic: negative for hives  Patient Care Team    Relationship Specialty Notifications Start End  Leamon Arnt, MD PCP - General Family Medicine  04/14/19   [name], MD Consulting Physician Gastroenterology  10/04/17  Workman, [place] Physician Surgery  10/04/17   [name], MD Consulting Physician Orthopedic Surgery  10/04/17   [name], MD Referring Physician Allergy  11/14/17   [name], MD Consulting Physician Ophthalmology  01/13/18     Objective  Vitals: BP 120/70 Comment: by consistent home readings  Pulse 78   Temp 97.8 F (36.6 C)  (Tympanic)   Resp 16   Ht 5\' 6"  (1.676 m)   Wt 235 lb 3.2 oz (106.7 kg)   SpO2 96%   BMI 37.96 kg/m  General:  Well developed, well nourished, no acute distress  Psych:  Alert and orientedx3,normal mood and affect HEENT:  Normocephalic, atraumatic, non-icteric sclera, PERRL, oropharynx is clear without mass or exudate, supple neck without adenopathy, mass or thyromegaly Cardiovascular:  Normal S1, S2, RRR without gallop, rub or murmur, nondisplaced PMI Respiratory:  Good breath sounds bilaterally, CTAB with normal respiratory effort Gastrointestinal: normal bowel sounds, soft, non-tender, no noted masses. No HSM MSK: no deformities, contusions. Joints are without erythema or swelling. Spine and CVA region are nontender Skin:  Warm, no rashes or suspicious lesions noted Neurologic:    Mental status is normal. CN 2-11 are normal. Gross motor and sensory exams are normal. Normal gait. No tremor    Commons side effects, risks, benefits, and alternatives for medications and treatment plan prescribed today were discussed, and the patient expressed understanding of the given instructions. Patient is instructed to call or message via MyChart if he/she has any questions or concerns regarding our treatment plan. No barriers to understanding were identified. We discussed Red Flag symptoms and signs in detail. Patient expressed understanding regarding what to do in case of urgent or emergency type symptoms.   Medication list was reconciled, printed and provided to the patient in AVS. Patient instructions and summary information was reviewed with the patient as documented in the AVS. This note was prepared with assistance of Dragon voice recognition software. Occasional wrong-word or sound-a-like substitutions may have occurred due to the inherent limitations of voice recognition software

## 2019-06-18 DIAGNOSIS — J3081 Allergic rhinitis due to animal (cat) (dog) hair and dander: Secondary | ICD-10-CM | POA: Diagnosis not present

## 2019-06-18 DIAGNOSIS — J3089 Other allergic rhinitis: Secondary | ICD-10-CM | POA: Diagnosis not present

## 2019-06-18 DIAGNOSIS — J301 Allergic rhinitis due to pollen: Secondary | ICD-10-CM | POA: Diagnosis not present

## 2019-06-25 ENCOUNTER — Other Ambulatory Visit: Payer: Self-pay | Admitting: Family Medicine

## 2019-06-25 DIAGNOSIS — J301 Allergic rhinitis due to pollen: Secondary | ICD-10-CM | POA: Diagnosis not present

## 2019-06-25 DIAGNOSIS — J3081 Allergic rhinitis due to animal (cat) (dog) hair and dander: Secondary | ICD-10-CM | POA: Diagnosis not present

## 2019-06-25 DIAGNOSIS — J3089 Other allergic rhinitis: Secondary | ICD-10-CM | POA: Diagnosis not present

## 2019-06-26 MED ORDER — ESTRADIOL-NORETHINDRONE ACET 1-0.5 MG PO TABS: 1.0000 | ORAL_TABLET | Freq: Every day | ORAL | 3 refills | Status: DC

## 2019-07-02 DIAGNOSIS — J3089 Other allergic rhinitis: Secondary | ICD-10-CM | POA: Diagnosis not present

## 2019-07-02 DIAGNOSIS — J301 Allergic rhinitis due to pollen: Secondary | ICD-10-CM | POA: Diagnosis not present

## 2019-07-02 DIAGNOSIS — J3081 Allergic rhinitis due to animal (cat) (dog) hair and dander: Secondary | ICD-10-CM | POA: Diagnosis not present

## 2019-07-04 ENCOUNTER — Other Ambulatory Visit: Payer: Self-pay

## 2019-07-04 ENCOUNTER — Telehealth (INDEPENDENT_AMBULATORY_CARE_PROVIDER_SITE_OTHER): Payer: Medicare HMO | Admitting: Family Medicine

## 2019-07-04 ENCOUNTER — Encounter (INDEPENDENT_AMBULATORY_CARE_PROVIDER_SITE_OTHER): Payer: Self-pay | Admitting: Family Medicine

## 2019-07-04 DIAGNOSIS — Z6837 Body mass index (BMI) 37.0-37.9, adult: Secondary | ICD-10-CM | POA: Diagnosis not present

## 2019-07-04 DIAGNOSIS — F3289 Other specified depressive episodes: Secondary | ICD-10-CM

## 2019-07-04 NOTE — Progress Notes (Signed)
Office: [phone]  /  Fax: [phone] TeleHealth Visit:  [name] has verbally consented to this TeleHealth visit today. The patient is located at home, the provider is located at the [employer] [facility] office. The participants in this visit include the listed provider and patient. The visit was conducted today via Doxy.  HPI:   Chief Complaint: OBESITY Maria Marquez is here to discuss her progress with her obesity treatment plan. She is on the Category 2 plan and is following her eating plan approximately 30-40% of the time. She states she is exercising 0 minutes 0 times per week. [name] has maintained her weight loss over the last 2-3 weeks. She had gained approximately 15 lbs during COVID-19 and is frustrated, but also deviating significantly from her plan. We were unable to weigh the patient today for this TeleHealth visit. She feels as if she has maintained weight since her last visit. She has lost 25 lbs since starting treatment with [name].  Depression  [name] is struggling with emotional eating and using food for comfort to the extent that it is negatively impacting her health. She often snacks when she is not hungry. [name] sometimes feels she is out of control and then feels guilty that she made poor food choices. She has been working on behavior modification techniques to help reduce her emotional eating and has been somewhat successful. [name] is stable on Wellbutrin. She still is bored and feeling lonely, but seems to be coping better. She shows no sign of suicidal or homicidal ideations.  Depression screen [facility] 2/9 11/23/2018 02/22/2018 12/12/2017 10/04/2017  Decreased Interest 0 3 3 0  Down, Depressed, Hopeless 0 3 2 0  PHQ - 2 Score 0 6 5 0  Altered sleeping - 2 3 -  Tired, decreased energy - 2 2 -  Change in appetite - 2 2 -  Feeling bad or failure about yourself  - 1 0 -  Trouble concentrating - 0 1 -  Moving slowly or fidgety/restless - 0 0 -  Suicidal thoughts - 0 0 -   PHQ-9 Score - 13 13 -  Difficult doing work/chores - Somewhat difficult Somewhat difficult -   ASSESSMENT AND PLAN:  Other depression  Class 2 severe obesity with serious comorbidity and body mass index (BMI) of 37.0 to 37.9 in adult, unspecified obesity type (HCC)  PLAN:  Depression  We discussed behavior modification techniques today to help [name] deal with her emotional eating and depression. [name] will continue Wellbutrin as is and we will continue to monitor.  Obesity [name] is not currently in the action stage of change. As such, her goal is to maintain weight for now. She has agreed to portion control better and make smarter food choices, such as increase vegetables and decrease simple carbohydrates.  [name] is not in the active stage of change and her goal is to maintain weight for now until she is ready to get back on track. [name] has been instructed to work up to a goal of 150 minutes of combined cardio and strengthening exercise per week for weight loss and overall health benefits. We discussed the following Behavioral Modification Strategies today: emotional eating strategies and holiday eating strategies.  [name] has agreed to follow-up with our clinic in 2-3 weeks. She was informed of the importance of frequent follow-up visits to maximize her success with intensive lifestyle modifications for her multiple health conditions.  ALLERGIES: Allergies  Allergen Reactions  . Rosuvastatin Other (See Comments)    Muscle  Pain in Thighs  . Shellfish Allergy Hives  . Sulfa Antibiotics Nausea Only  . Latex Rash  . Lisinopril Cough    MEDICATIONS: Current Outpatient Medications on File Prior to Visit  Medication Sig Dispense Refill  . albuterol (VENTOLIN HFA) 108 (90 Base) MCG/ACT inhaler Inhale 1-2 puffs into the lungs every 6 (six) hours as needed for wheezing or shortness of breath. 18 g 0  . aspirin 81 MG chewable tablet Chew by mouth daily.    [name] buPROPion (WELLBUTRIN SR)  200 MG 12 hr tablet Take 1 tablet (200 mg total) by mouth 2 (two) times daily. 60 tablet 0  . co-enzyme Q-10 30 MG capsule Take 1 capsule (30 mg total) by mouth daily.    [name] EPINEPHrine (EPIPEN 2-PAK) 0.3 mg/0.3 mL IJ SOAJ injection as needed.    [name] estradiol-norethindrone (ACTIVELLA) 1-0.5 MG tablet Take 1 tablet by mouth daily. 84 tablet 3  . ezetimibe (ZETIA) 10 MG tablet TAKE 1 TABLET EVERY DAY 90 tablet 2  . levothyroxine (SYNTHROID, LEVOTHROID) 137 MCG tablet Take 1 tablet (137 mcg total) by mouth at bedtime. 90 tablet 3  . losartan-hydrochlorothiazide (HYZAAR) 50-12.5 MG tablet TAKE 1 TABLET EVERY DAY 90 tablet 2  . omeprazole (PRILOSEC) 20 MG capsule Take 1 capsule (20 mg total) by mouth daily. 90 capsule 0  . Probiotic Product (PROBIOTIC DAILY PO) Take by mouth.    . simvastatin (ZOCOR) 10 MG tablet Take 1 tablet (10 mg total) by mouth 2 (two) times a week. 90 tablet 1  . Vitamin D, Ergocalciferol, (DRISDOL) 1.25 MG (50000 UT) CAPS capsule Take 1 capsule (50,000 Units total) by mouth every 7 (seven) days. 4 capsule 0   No current facility-administered medications on file prior to visit.     PAST MEDICAL HISTORY: Past Medical History:  Diagnosis Date  . Acute blood loss anemia   . Arthritis   . Back pain   . Bilious vomiting with nausea   . Constipation   . GERD (gastroesophageal reflux disease)   . HLD (hyperlipidemia)   . Hypertension   . Hypothyroid   . Joint pain   . Leg edema   . Leukocytosis   . Multiple food allergies   . Myalgia due to statin 11/23/2018  . Osteoarthritis of hip    Right  . Postmenopausal hormone replacement therapy 06/17/2015  . Prediabetes   . Sessile colonic polyp 10/04/2017   Colonoscopy 12/2015, Dr. Fuller Plan, repeat q 5 years.    PAST SURGICAL HISTORY: Past Surgical History:  Procedure Laterality Date  . EYE SURGERY  1987  . HERNIA REPAIR    . THYROID LOBECTOMY     right side  . 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 Smoking status: Current Every Day Smoker    Packs/day: 0.25    Types: Cigarettes  . Smokeless tobacco: Current User  Substance Use Topics  . Alcohol use: Yes    Comment: rarely  . Drug use: No    FAMILY HISTORY: Family History  Problem Relation Age of Onset  . Arthritis Mother   . Breast cancer Mother   . Schizophrenia Mother   . Diabetes Father   . Heart disease Father   . Kidney disease Father   . Stroke Sister   . Lung cancer  Brother   . Heart disease Brother   . Lung cancer Brother   . Heart disease Brother   . Vision loss Brother        Glaucoma  . Alcohol abuse Brother    ROS: Review of Systems  Psychiatric/Behavioral: Positive for depression. Negative for suicidal ideas.       Negative for homicidal ideas.   PHYSICAL EXAM: Pt in no acute distress  RECENT LABS AND TESTS: BMET    Component Value Date/Time   NA 139 06/14/2019 1353   NA 140 11/01/2018 1306   K 4.4 06/14/2019 1353   CL 104 06/14/2019 1353   CO2 27 06/14/2019 1353   GLUCOSE 128 (H) 06/14/2019 1353   BUN 19 06/14/2019 1353   BUN 17 11/01/2018 1306   CREATININE 1.53 (H) 06/14/2019 1353   CALCIUM 9.1 06/14/2019 1353   GFRNONAA 41 (L) 04/14/2019 1352   GFRAA 48 (L) 04/14/2019 1352   Lab Results  Component Value Date   HGBA1C 5.7 (H) 11/01/2018   HGBA1C 5.8 (H) 06/13/2018   HGBA1C 6.4 01/17/2018   HGBA1C 6.1 09/23/2015   Lab Results  Component Value Date   INSULIN 11.7 11/01/2018   INSULIN 15.0 06/13/2018   INSULIN 14.0 02/22/2018   CBC    Component Value Date/Time   WBC 8.3 06/14/2019 1353   RBC 4.45 06/14/2019 1353   HGB 14.3 06/14/2019 1353   HGB 14.5 11/01/2018 1306   HCT 42.3 06/14/2019 1353   HCT 43.0 11/01/2018 1306   PLT 247.0 06/14/2019 1353   PLT 263 02/22/2018 0936   MCV 95.0  06/14/2019 1353   MCV 90 11/01/2018 1306   MCH 30.8 04/14/2019 1352   MCHC 33.9 06/14/2019 1353   RDW 14.1 06/14/2019 1353   RDW 13.6 11/01/2018 1306   LYMPHSABS 1.5 06/14/2019 1353   LYMPHSABS 1.7 11/01/2018 1306   MONOABS 0.7 06/14/2019 1353   EOSABS 0.2 06/14/2019 1353   EOSABS 0.2 11/01/2018 1306   BASOSABS 0.0 06/14/2019 1353   BASOSABS 0.1 11/01/2018 1306   Iron/TIBC/Ferritin/ %Sat No results found for: IRON, TIBC, FERRITIN, IRONPCTSAT Lipid Panel     Component Value Date/Time   CHOL 199 06/14/2019 1353   CHOL 222 (H) 11/01/2018 1306   TRIG 147.0 06/14/2019 1353   HDL 40.70 06/14/2019 1353   HDL 40 11/01/2018 1306   CHOLHDL 5 06/14/2019 1353   VLDL 29.4 06/14/2019 1353   LDLCALC 129 (H) 06/14/2019 1353   LDLCALC 151 (H) 11/01/2018 1306   LDLDIRECT 197.0 01/13/2018 0915   Hepatic Function Panel     Component Value Date/Time   PROT 6.5 06/14/2019 1353   PROT 6.6 11/01/2018 1306   ALBUMIN 4.3 06/14/2019 1353   ALBUMIN 4.2 11/01/2018 1306   AST 14 06/14/2019 1353   ALT 19 06/14/2019 1353   ALKPHOS 72 06/14/2019 1353   BILITOT 0.8 06/14/2019 1353   BILITOT 0.6 11/01/2018 1306      Component Value Date/Time   TSH 2.81 06/14/2019 1353   TSH 4.190 11/01/2018 1306   TSH 2.010 06/13/2018 0843   Results for WARZECHA, Morenike "LARI LINSON" (MRN [id number]) as of 07/04/2019 16:05  Ref. Range 11/01/2018 13:06  Vitamin D, 25-Hydroxy Latest Ref Range: 30.0 - 100.0 ng/mL 42.6   I, Michaelene Song, am acting as Location manager for [name], MD I have reviewed the above documentation for accuracy and completeness, and I agree with the above. -[name], MD

## 2019-07-10 DIAGNOSIS — J301 Allergic rhinitis due to pollen: Secondary | ICD-10-CM | POA: Diagnosis not present

## 2019-07-10 DIAGNOSIS — J3089 Other allergic rhinitis: Secondary | ICD-10-CM | POA: Diagnosis not present

## 2019-07-10 DIAGNOSIS — J3081 Allergic rhinitis due to animal (cat) (dog) hair and dander: Secondary | ICD-10-CM | POA: Diagnosis not present

## 2019-07-17 DIAGNOSIS — J3089 Other allergic rhinitis: Secondary | ICD-10-CM | POA: Diagnosis not present

## 2019-07-17 DIAGNOSIS — J301 Allergic rhinitis due to pollen: Secondary | ICD-10-CM | POA: Diagnosis not present

## 2019-07-17 DIAGNOSIS — J3081 Allergic rhinitis due to animal (cat) (dog) hair and dander: Secondary | ICD-10-CM | POA: Diagnosis not present

## 2019-07-19 ENCOUNTER — Other Ambulatory Visit (INDEPENDENT_AMBULATORY_CARE_PROVIDER_SITE_OTHER): Payer: Self-pay | Admitting: Family Medicine

## 2019-07-19 DIAGNOSIS — E559 Vitamin D deficiency, unspecified: Secondary | ICD-10-CM

## 2019-07-21 ENCOUNTER — Other Ambulatory Visit (INDEPENDENT_AMBULATORY_CARE_PROVIDER_SITE_OTHER): Payer: Self-pay | Admitting: Family Medicine

## 2019-07-21 DIAGNOSIS — F3289 Other specified depressive episodes: Secondary | ICD-10-CM

## 2019-07-24 DIAGNOSIS — J3081 Allergic rhinitis due to animal (cat) (dog) hair and dander: Secondary | ICD-10-CM | POA: Diagnosis not present

## 2019-07-24 DIAGNOSIS — J301 Allergic rhinitis due to pollen: Secondary | ICD-10-CM | POA: Diagnosis not present

## 2019-07-24 DIAGNOSIS — J3089 Other allergic rhinitis: Secondary | ICD-10-CM | POA: Diagnosis not present

## 2019-07-25 ENCOUNTER — Other Ambulatory Visit: Payer: Self-pay

## 2019-07-25 ENCOUNTER — Telehealth (INDEPENDENT_AMBULATORY_CARE_PROVIDER_SITE_OTHER): Payer: Medicare HMO | Admitting: Family Medicine

## 2019-07-25 ENCOUNTER — Encounter (INDEPENDENT_AMBULATORY_CARE_PROVIDER_SITE_OTHER): Payer: Self-pay | Admitting: Family Medicine

## 2019-07-25 DIAGNOSIS — Z6837 Body mass index (BMI) 37.0-37.9, adult: Secondary | ICD-10-CM | POA: Diagnosis not present

## 2019-07-25 DIAGNOSIS — F3289 Other specified depressive episodes: Secondary | ICD-10-CM

## 2019-07-25 MED ORDER — BUPROPION HCL ER (SR) 200 MG PO TB12: 200.0000 mg | ORAL_TABLET | Freq: Two times a day (BID) | ORAL | 0 refills | Status: DC

## 2019-07-25 NOTE — Progress Notes (Signed)
Office: [phone]  /  Fax: [phone] TeleHealth Visit:  [name] has verbally consented to this TeleHealth visit today. The patient is located at home, the provider is located at the [employer] [facility] office. The participants in this visit include the listed provider and patient. The visit was conducted today via FaceTime.  HPI:   Chief Complaint: OBESITY Maria Marquez is here to discuss her progress with her obesity treatment plan. She is on the Category 2 plan and is following her eating plan approximately 30-40% of the time. She states she is exercising 0 minutes 0 times per week. Antoniette feels she has done well avoiding weight gain over Thanksgiving. She did indulge over Thanksgiving, but has done better getting back on track.  We were unable to weigh the patient today for this TeleHealth visit. She feels as if she has lost weight since her last visit. She has lost 25 lbs since starting treatment with [name].  Depression  Maria Marquez is struggling with emotional eating and using food for comfort to the extent that it is negatively impacting her health. She often snacks when she is not hungry. Maria Marquez sometimes feels she is out of control and then feels guilty that she made poor food choices. She has been working on behavior modification techniques to help reduce her emotional eating and has been somewhat successful. Maria Marquez's mood appears somewhat better on her Wellbutrin. She is still feeling isolated due to COVID, but is being safe. She shows no sign of suicidal or homicidal ideations.  Depression screen [facility] 2/9 11/23/2018 02/22/2018 12/12/2017 10/04/2017  Decreased Interest 0 3 3 0  Down, Depressed, Hopeless 0 3 2 0  PHQ - 2 Score 0 6 5 0  Altered sleeping - 2 3 -  Tired, decreased energy - 2 2 -  Change in appetite - 2 2 -  Feeling bad or failure about yourself  - 1 0 -  Trouble concentrating - 0 1 -  Moving slowly or fidgety/restless - 0 0 -  Suicidal thoughts - 0 0 -  PHQ-9 Score - 13  13 -  Difficult doing work/chores - Somewhat difficult Somewhat difficult -   ASSESSMENT AND PLAN:  Other depression - with emotional eating - Plan: buPROPion (WELLBUTRIN SR) 200 MG 12 hr tablet  Class 2 severe obesity with serious comorbidity and body mass index (BMI) of 37.0 to 37.9 in adult, unspecified obesity type (HCC)  PLAN:  Emotional Eating Behaviors (other depression) We discussed behavior modification techniques today to help Maria Marquez deal with her emotional eating behaviors. Maria Marquez was given a refill on her Wellbutrin 200 mg BID #60 with 0 refills and agrees to follow-up with our clinic in 2-3 weeks.  I spent > than 50% of the 20 minute visit on counseling as documented in the note.  TIME SPENT: 22 minutes   Obesity Maria Marquez is currently in the action stage of change. As such, her goal is to continue with weight loss efforts. She has agreed to follow the Category 2 plan. Maria Marquez has been instructed to work up to a goal of 150 minutes of combined cardio and strengthening exercise per week for weight loss and overall health benefits. We discussed the following Behavioral Modification Strategies today: increasing lean protein intake, decreasing simple carbohydrates, and emotional eating strategies.   Maria Marquez has agreed to follow-up with our clinic in 2-3 weeks. She was informed of the importance of frequent follow-up visits to maximize her success with intensive lifestyle modifications for her multiple health  conditions.  ALLERGIES: Allergies  Allergen Reactions  . Rosuvastatin Other (See Comments)    Muscle Pain in Thighs  . Shellfish Allergy Hives  . Sulfa Antibiotics Nausea Only  . Latex Rash  . Lisinopril Cough    MEDICATIONS: Current Outpatient Medications on File Prior to Visit  Medication Sig Dispense Refill  . albuterol (VENTOLIN HFA) 108 (90 Base) MCG/ACT inhaler Inhale 1-2 puffs into the lungs every 6 (six) hours as needed for wheezing or shortness of breath. 18 g 0   . aspirin 81 MG chewable tablet Chew by mouth daily.    [name] co-enzyme Q-10 30 MG capsule Take 1 capsule (30 mg total) by mouth daily.    [name] EPINEPHrine (EPIPEN 2-PAK) 0.3 mg/0.3 mL IJ SOAJ injection as needed.    [name] estradiol-norethindrone (ACTIVELLA) 1-0.5 MG tablet Take 1 tablet by mouth daily. 84 tablet 3  . ezetimibe (ZETIA) 10 MG tablet TAKE 1 TABLET EVERY DAY 90 tablet 2  . levothyroxine (SYNTHROID, LEVOTHROID) 137 MCG tablet Take 1 tablet (137 mcg total) by mouth at bedtime. 90 tablet 3  . losartan-hydrochlorothiazide (HYZAAR) 50-12.5 MG tablet TAKE 1 TABLET EVERY DAY 90 tablet 2  . omeprazole (PRILOSEC) 20 MG capsule Take 1 capsule (20 mg total) by mouth daily. 90 capsule 0  . Probiotic Product (PROBIOTIC DAILY PO) Take by mouth.    . simvastatin (ZOCOR) 10 MG tablet Take 1 tablet (10 mg total) by mouth 2 (two) times a week. 90 tablet 1  . Vitamin D, Ergocalciferol, (DRISDOL) 1.25 MG (50000 UT) CAPS capsule Take 1 capsule (50,000 Units total) by mouth every 7 (seven) days. 4 capsule 0   No current facility-administered medications on file prior to visit.     PAST MEDICAL HISTORY: Past Medical History:  Diagnosis Date  . Acute blood loss anemia   . Arthritis   . Back pain   . Bilious vomiting with nausea   . Constipation   . GERD (gastroesophageal reflux disease)   . HLD (hyperlipidemia)   . Hypertension   . Hypothyroid   . Joint pain   . Leg edema   . Leukocytosis   . Multiple food allergies   . Myalgia due to statin 11/23/2018  . Osteoarthritis of hip    Right  . Postmenopausal hormone replacement therapy 06/17/2015  . Prediabetes   . Sessile colonic polyp 10/04/2017   Colonoscopy 12/2015, Dr. Fuller Plan, repeat q 5 years.    PAST SURGICAL HISTORY: Past Surgical History:  Procedure Laterality Date  . EYE SURGERY  1987  . HERNIA REPAIR    . THYROID LOBECTOMY     right side  . TOTAL HIP ARTHROPLASTY Right 01/28/2016   Procedure: RIGHT TOTAL HIP ARTHROPLASTY ANTERIOR APPROACH;   Surgeon: [name], MD;  Location: [facility];  Service: Orthopedics;  Laterality: Right;    SOCIAL HISTORY: Social History   Tobacco Use  . Smoking status: Current Every Day Smoker    Packs/day: 0.25    Types: Cigarettes  . Smokeless tobacco: Current User  Substance Use Topics  . Alcohol use: Yes    Comment: rarely  . Drug use: No    FAMILY HISTORY: Family History  Problem Relation Age of Onset  . Arthritis Mother   . Breast cancer Mother   . Schizophrenia Mother   . Diabetes Father   . Heart disease Father   . Kidney disease Father   . Stroke Sister   . Lung cancer Brother   . Heart disease Brother   .  Lung cancer Brother   . Heart disease Brother   . Vision loss Brother        Glaucoma  . Alcohol abuse Brother    ROS: Review of Systems  Psychiatric/Behavioral: Positive for depression. Negative for suicidal ideas.       Negative for homicidal ideas.   PHYSICAL EXAM: Pt in no acute distress  RECENT LABS AND TESTS: BMET    Component Value Date/Time   NA 139 06/14/2019 1353   NA 140 11/01/2018 1306   K 4.4 06/14/2019 1353   CL 104 06/14/2019 1353   CO2 27 06/14/2019 1353   GLUCOSE 128 (H) 06/14/2019 1353   BUN 19 06/14/2019 1353   BUN 17 11/01/2018 1306   CREATININE 1.53 (H) 06/14/2019 1353   CALCIUM 9.1 06/14/2019 1353   GFRNONAA 41 (L) 04/14/2019 1352   GFRAA 48 (L) 04/14/2019 1352   Lab Results  Component Value Date   HGBA1C 5.7 (H) 11/01/2018   HGBA1C 5.8 (H) 06/13/2018   HGBA1C 6.4 01/17/2018   HGBA1C 6.1 09/23/2015   Lab Results  Component Value Date   INSULIN 11.7 11/01/2018   INSULIN 15.0 06/13/2018   INSULIN 14.0 02/22/2018   CBC    Component Value Date/Time   WBC 8.3 06/14/2019 1353   RBC 4.45 06/14/2019 1353   HGB 14.3 06/14/2019 1353   HGB 14.5 11/01/2018 1306   HCT 42.3 06/14/2019 1353   HCT 43.0 11/01/2018 1306   PLT 247.0 06/14/2019 1353   PLT 263 02/22/2018 0936   MCV 95.0 06/14/2019 1353   MCV 90 11/01/2018 1306    MCH 30.8 04/14/2019 1352   MCHC 33.9 06/14/2019 1353   RDW 14.1 06/14/2019 1353   RDW 13.6 11/01/2018 1306   LYMPHSABS 1.5 06/14/2019 1353   LYMPHSABS 1.7 11/01/2018 1306   MONOABS 0.7 06/14/2019 1353   EOSABS 0.2 06/14/2019 1353   EOSABS 0.2 11/01/2018 1306   BASOSABS 0.0 06/14/2019 1353   BASOSABS 0.1 11/01/2018 1306   Iron/TIBC/Ferritin/ %Sat No results found for: IRON, TIBC, FERRITIN, IRONPCTSAT Lipid Panel     Component Value Date/Time   CHOL 199 06/14/2019 1353   CHOL 222 (H) 11/01/2018 1306   TRIG 147.0 06/14/2019 1353   HDL 40.70 06/14/2019 1353   HDL 40 11/01/2018 1306   CHOLHDL 5 06/14/2019 1353   VLDL 29.4 06/14/2019 1353   LDLCALC 129 (H) 06/14/2019 1353   LDLCALC 151 (H) 11/01/2018 1306   LDLDIRECT 197.0 01/13/2018 0915   Hepatic Function Panel     Component Value Date/Time   PROT 6.5 06/14/2019 1353   PROT 6.6 11/01/2018 1306   ALBUMIN 4.3 06/14/2019 1353   ALBUMIN 4.2 11/01/2018 1306   AST 14 06/14/2019 1353   ALT 19 06/14/2019 1353   ALKPHOS 72 06/14/2019 1353   BILITOT 0.8 06/14/2019 1353   BILITOT 0.6 11/01/2018 1306      Component Value Date/Time   TSH 2.81 06/14/2019 1353   TSH 4.190 11/01/2018 1306   TSH 2.010 06/13/2018 0843   Results for CAMPISI, Yannely "NICOLLE HEWARD" (MRN [id number]) as of 07/25/2019 11:47  Ref. Range 11/01/2018 13:06  Vitamin D, 25-Hydroxy Latest Ref Range: 30.0 - 100.0 ng/mL 42.6   I, Michaelene Song, am acting as Location manager for [name], MD I have reviewed the above documentation for accuracy and completeness, and I agree with the above. -[name], MD

## 2019-07-30 ENCOUNTER — Other Ambulatory Visit: Payer: Self-pay | Admitting: Family Medicine

## 2019-07-31 DIAGNOSIS — J3089 Other allergic rhinitis: Secondary | ICD-10-CM | POA: Diagnosis not present

## 2019-07-31 DIAGNOSIS — J3081 Allergic rhinitis due to animal (cat) (dog) hair and dander: Secondary | ICD-10-CM | POA: Diagnosis not present

## 2019-07-31 DIAGNOSIS — J301 Allergic rhinitis due to pollen: Secondary | ICD-10-CM | POA: Diagnosis not present

## 2019-08-07 DIAGNOSIS — J3081 Allergic rhinitis due to animal (cat) (dog) hair and dander: Secondary | ICD-10-CM | POA: Diagnosis not present

## 2019-08-07 DIAGNOSIS — J3089 Other allergic rhinitis: Secondary | ICD-10-CM | POA: Diagnosis not present

## 2019-08-07 DIAGNOSIS — J301 Allergic rhinitis due to pollen: Secondary | ICD-10-CM | POA: Diagnosis not present

## 2019-08-08 ENCOUNTER — Encounter: Payer: Self-pay | Admitting: Family Medicine

## 2019-08-08 ENCOUNTER — Other Ambulatory Visit: Payer: Self-pay

## 2019-08-08 ENCOUNTER — Telehealth (INDEPENDENT_AMBULATORY_CARE_PROVIDER_SITE_OTHER): Payer: Medicare HMO | Admitting: Family Medicine

## 2019-08-08 ENCOUNTER — Encounter (INDEPENDENT_AMBULATORY_CARE_PROVIDER_SITE_OTHER): Payer: Self-pay | Admitting: Family Medicine

## 2019-08-08 DIAGNOSIS — Z6837 Body mass index (BMI) 37.0-37.9, adult: Secondary | ICD-10-CM | POA: Diagnosis not present

## 2019-08-08 DIAGNOSIS — E559 Vitamin D deficiency, unspecified: Secondary | ICD-10-CM | POA: Diagnosis not present

## 2019-08-08 DIAGNOSIS — F3289 Other specified depressive episodes: Secondary | ICD-10-CM

## 2019-08-08 MED ORDER — LOSARTAN POTASSIUM-HCTZ 50-12.5 MG PO TABS: 1.0000 | ORAL_TABLET | Freq: Every day | ORAL | 2 refills | Status: DC

## 2019-08-13 MED ORDER — VITAMIN D (ERGOCALCIFEROL) 1.25 MG (50000 UNIT) PO CAPS: 50000.0000 [IU] | ORAL_CAPSULE | ORAL | 0 refills | Status: DC

## 2019-08-13 MED ORDER — BUPROPION HCL ER (SR) 200 MG PO TB12: 200.0000 mg | ORAL_TABLET | Freq: Two times a day (BID) | ORAL | 0 refills | Status: DC

## 2019-08-14 DIAGNOSIS — J3089 Other allergic rhinitis: Secondary | ICD-10-CM | POA: Diagnosis not present

## 2019-08-14 DIAGNOSIS — J301 Allergic rhinitis due to pollen: Secondary | ICD-10-CM | POA: Diagnosis not present

## 2019-08-14 DIAGNOSIS — J3081 Allergic rhinitis due to animal (cat) (dog) hair and dander: Secondary | ICD-10-CM | POA: Diagnosis not present

## 2019-08-14 NOTE — Progress Notes (Signed)
Office: [phone]  /  Fax: [phone] TeleHealth Visit:  Maria Marquez has verbally consented to this TeleHealth visit today. The patient is located at home, the provider is located at the [facility] office. The participants in this visit include the listed provider and patient. The visit was conducted today via face time.  HPI:  Chief Complaint: OBESITY Maria Marquez is here to discuss her progress with her obesity treatment plan. She is on the follow the Category 2 plan and states she is following her eating plan approximately 25 % of the time. She states she is exercising 0 minutes 0 times per week.  Maria Marquez continues to well with maintaining her weight loss. She doesn't think the holidays will be challenging with weight gain due to no big get togethers. She is of course disappointed she will not be with her family over Christmas. She notes her blood pressure was 111/72, 2 days ago.  Vitamin D Deficiency Maria Marquez has a diagnosis of vitamin D deficiency. She is stable on Vit D. She is due for labs, but she has been quarantining most of the year.  Depression with Emotional Eating Behaviors Maria Marquez's mood is still low due to isolation and not being able to celebrate the holidays. She had to change to a lower dose of Wellbutrin due to a pharmacy mailing error, and she requests a refill of the 200 mg dose.  ASSESSMENT AND PLAN:  Vitamin D deficiency - Plan: Vitamin D, Ergocalciferol, (DRISDOL) 1.25 MG (50000 UT) CAPS capsule  Other depression - with emotional eating - Plan: buPROPion (WELLBUTRIN SR) 200 MG 12 hr tablet  Class 2 severe obesity with serious comorbidity and body mass index (BMI) of 37.0 to 37.9 in adult, unspecified obesity type (Maria Marquez)  PLAN:  Vitamin D Deficiency Low vitamin D level contributes to fatigue and are associated with obesity, breast, and colon cancer. Maria Marquez agrees to continue taking prescription Vit D 50,000 IU every week #4 and we will refill for 1  month. She will follow up for routine testing of vitamin D, at least 2-3 times per year to avoid over-replacement. We will continue to monitor.  Emotional Eating Behaviors (other depression) Behavior modification techniques were discussed today to help Maria Marquez deal with her emotional/non-hunger eating behaviors. Maria Marquez agrees to continue taking Wellbutrin SR 200 mg PO BID #60 and we will refill for 1 month. We will continue to follow and monitor her progress.  Obesity Maria Marquez is currently in the action stage of change. As such, her goal is to continue with weight loss efforts She has agreed to follow the Category 2 plan Maria Marquez wll work up to a goal of 150 minutes of combined cardio and strengthening exercise per week for weight loss and overall health benefits. We discussed the following Behavioral Modification Strategies today: holiday eating strategies  and emotional eating strategies   Maria Marquez has agreed to follow up with our clinic in 3 weeks. She was informed of the importance of frequent follow up visits to maximize her success with intensive lifestyle modifications for her multiple health conditions.  ALLERGIES: Allergies  Allergen Reactions  . Rosuvastatin Other (See Comments)    Muscle Pain in Thighs  . Shellfish Allergy Hives  . Sulfa Antibiotics Nausea Only  . Latex Rash  . Lisinopril Cough    MEDICATIONS: Current Outpatient Medications on File Prior to Visit  Medication Sig Dispense Refill  . albuterol (VENTOLIN HFA) 108 (90 Base) MCG/ACT inhaler Inhale 1-2 puffs into the lungs every 6 (six)  hours as needed for wheezing or shortness of breath. 18 g 0  . aspirin 81 MG chewable tablet Chew by mouth daily.    [name] co-enzyme Q-10 30 MG capsule Take 1 capsule (30 mg total) by mouth daily.    [name] EPINEPHrine (EPIPEN 2-PAK) 0.3 mg/0.3 mL IJ SOAJ injection as needed.    [name] estradiol-norethindrone (ACTIVELLA) 1-0.5 MG tablet Take 1 tablet by mouth daily. 84 tablet 3  . ezetimibe (ZETIA) 10 MG  tablet TAKE 1 TABLET EVERY DAY 90 tablet 2  . levothyroxine (SYNTHROID, LEVOTHROID) 137 MCG tablet Take 1 tablet (137 mcg total) by mouth at bedtime. 90 tablet 3  . omeprazole (PRILOSEC) 20 MG capsule TAKE 1 CAPSULE EVERY DAY 90 capsule 0  . Probiotic Product (PROBIOTIC DAILY PO) Take by mouth.    . simvastatin (ZOCOR) 10 MG tablet Take 1 tablet (10 mg total) by mouth 2 (two) times a week. 90 tablet 1   No current facility-administered medications on file prior to visit.    PAST MEDICAL HISTORY: Past Medical History:  Diagnosis Date  . Acute blood loss anemia   . Arthritis   . Back pain   . Bilious vomiting with nausea   . Constipation   . GERD (gastroesophageal reflux disease)   . HLD (hyperlipidemia)   . Hypertension   . Hypothyroid   . Joint pain   . Leg edema   . Leukocytosis   . Multiple food allergies   . Myalgia due to statin 11/23/2018  . Osteoarthritis of hip    Right  . Postmenopausal hormone replacement therapy 06/17/2015  . Prediabetes   . Sessile colonic polyp 10/04/2017   Colonoscopy 12/2015, Dr. Fuller Plan, repeat q 5 years.    PAST SURGICAL HISTORY: Past Surgical History:  Procedure Laterality Date  . EYE SURGERY  1987  . HERNIA REPAIR    . THYROID LOBECTOMY     right side  . 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 Smoking status: Current Every Day Smoker    Packs/day: 0.25    Types: Cigarettes  . Smokeless tobacco: Current User  Substance Use Topics  . Alcohol use: Yes    Comment: rarely  . Drug use: No    FAMILY HISTORY: Family History  Problem Relation Age of Onset  . Arthritis Mother   . Breast cancer Mother   . Schizophrenia Mother   . Diabetes Father   . Heart disease Father   . Kidney disease Father   . Stroke Sister   . Lung cancer  Brother   . Heart disease Brother   . Lung cancer Brother   . Heart disease Brother   . Vision loss Brother        Glaucoma  . Alcohol abuse Brother     ROS: Review of Systems  Constitutional: Negative for weight loss.  Psychiatric/Behavioral: Positive for depression.    PHYSICAL EXAM: There were no vitals taken for this visit. There is no height or weight on file to calculate BMI. Physical Exam Vitals reviewed.  Constitutional:      Appearance: Normal appearance. She is obese.  Cardiovascular:     Rate and Rhythm: Normal rate.     Pulses: Normal pulses.  Pulmonary:     Effort: Pulmonary effort is normal.  Breath sounds: Normal breath sounds.  Musculoskeletal:        General: Normal range of motion.  Skin:    General: Skin is warm and dry.  Neurological:     Mental Status: She is alert and oriented to person, place, and time.  Psychiatric:        Mood and Affect: Mood normal.        Behavior: Behavior normal.     RECENT LABS AND TESTS: BMET    Component Value Date/Time   NA 139 06/14/2019 1353   NA 140 11/01/2018 1306   K 4.4 06/14/2019 1353   CL 104 06/14/2019 1353   CO2 27 06/14/2019 1353   GLUCOSE 128 (H) 06/14/2019 1353   BUN 19 06/14/2019 1353   BUN 17 11/01/2018 1306   CREATININE 1.53 (H) 06/14/2019 1353   CALCIUM 9.1 06/14/2019 1353   GFRNONAA 41 (L) 04/14/2019 1352   GFRAA 48 (L) 04/14/2019 1352   Lab Results  Component Value Date   HGBA1C 5.7 (H) 11/01/2018   HGBA1C 5.8 (H) 06/13/2018   HGBA1C 6.4 01/17/2018   HGBA1C 6.1 09/23/2015   Lab Results  Component Value Date   INSULIN 11.7 11/01/2018   INSULIN 15.0 06/13/2018   INSULIN 14.0 02/22/2018   CBC    Component Value Date/Time   WBC 8.3 06/14/2019 1353   RBC 4.45 06/14/2019 1353   HGB 14.3 06/14/2019 1353   HGB 14.5 11/01/2018 1306   HCT 42.3 06/14/2019 1353   HCT 43.0 11/01/2018 1306   PLT 247.0 06/14/2019 1353   PLT 263 02/22/2018 0936   MCV 95.0 06/14/2019 1353   MCV 90  11/01/2018 1306   MCH 30.8 04/14/2019 1352   MCHC 33.9 06/14/2019 1353   RDW 14.1 06/14/2019 1353   RDW 13.6 11/01/2018 1306   LYMPHSABS 1.5 06/14/2019 1353   LYMPHSABS 1.7 11/01/2018 1306   MONOABS 0.7 06/14/2019 1353   EOSABS 0.2 06/14/2019 1353   EOSABS 0.2 11/01/2018 1306   BASOSABS 0.0 06/14/2019 1353   BASOSABS 0.1 11/01/2018 1306   Iron/TIBC/Ferritin/ %Sat No results found for: IRON, TIBC, FERRITIN, IRONPCTSAT Lipid Panel     Component Value Date/Time   CHOL 199 06/14/2019 1353   CHOL 222 (H) 11/01/2018 1306   TRIG 147.0 06/14/2019 1353   HDL 40.70 06/14/2019 1353   HDL 40 11/01/2018 1306   CHOLHDL 5 06/14/2019 1353   VLDL 29.4 06/14/2019 1353   LDLCALC 129 (H) 06/14/2019 1353   LDLCALC 151 (H) 11/01/2018 1306   LDLDIRECT 197.0 01/13/2018 0915   Hepatic Function Panel     Component Value Date/Time   PROT 6.5 06/14/2019 1353   PROT 6.6 11/01/2018 1306   ALBUMIN 4.3 06/14/2019 1353   ALBUMIN 4.2 11/01/2018 1306   AST 14 06/14/2019 1353   ALT 19 06/14/2019 1353   ALKPHOS 72 06/14/2019 1353   BILITOT 0.8 06/14/2019 1353   BILITOT 0.6 11/01/2018 1306      Component Value Date/Time   TSH 2.81 06/14/2019 1353   TSH 4.190 11/01/2018 1306   TSH 2.010 06/13/2018 0843        I, Trixie Dredge, am acting as transcriptionist for [name], MD I have reviewed the above documentation for accuracy and completeness, and I agree with the above. -[name], MD

## 2019-08-21 DIAGNOSIS — J3089 Other allergic rhinitis: Secondary | ICD-10-CM | POA: Diagnosis not present

## 2019-08-21 DIAGNOSIS — J301 Allergic rhinitis due to pollen: Secondary | ICD-10-CM | POA: Diagnosis not present

## 2019-08-21 DIAGNOSIS — J3081 Allergic rhinitis due to animal (cat) (dog) hair and dander: Secondary | ICD-10-CM | POA: Diagnosis not present

## 2019-08-28 DIAGNOSIS — R05 Cough: Secondary | ICD-10-CM | POA: Diagnosis not present

## 2019-08-28 DIAGNOSIS — J3089 Other allergic rhinitis: Secondary | ICD-10-CM | POA: Diagnosis not present

## 2019-08-28 DIAGNOSIS — J3081 Allergic rhinitis due to animal (cat) (dog) hair and dander: Secondary | ICD-10-CM | POA: Diagnosis not present

## 2019-08-28 DIAGNOSIS — J301 Allergic rhinitis due to pollen: Secondary | ICD-10-CM | POA: Diagnosis not present

## 2019-08-29 ENCOUNTER — Other Ambulatory Visit: Payer: Self-pay

## 2019-08-29 ENCOUNTER — Telehealth (INDEPENDENT_AMBULATORY_CARE_PROVIDER_SITE_OTHER): Payer: Medicare HMO | Admitting: Family Medicine

## 2019-08-29 ENCOUNTER — Encounter (INDEPENDENT_AMBULATORY_CARE_PROVIDER_SITE_OTHER): Payer: Self-pay | Admitting: Family Medicine

## 2019-08-29 DIAGNOSIS — E559 Vitamin D deficiency, unspecified: Secondary | ICD-10-CM | POA: Diagnosis not present

## 2019-08-29 DIAGNOSIS — F3289 Other specified depressive episodes: Secondary | ICD-10-CM

## 2019-08-29 DIAGNOSIS — Z6837 Body mass index (BMI) 37.0-37.9, adult: Secondary | ICD-10-CM

## 2019-08-29 MED ORDER — VITAMIN D (ERGOCALCIFEROL) 1.25 MG (50000 UNIT) PO CAPS: 50000.0000 [IU] | ORAL_CAPSULE | ORAL | 0 refills | Status: DC

## 2019-09-03 NOTE — Progress Notes (Signed)
TeleHealth Visit:  Due to the COVID-19 pandemic, this visit was completed with telemedicine (audio/video) technology to reduce patient and provider exposure as well as to preserve personal protective equipment.   Maria Marquez has verbally consented to this TeleHealth visit. The patient is located at home, the provider is located at the [facility] office. The participants in this visit include the listed provider and patient. The visit was conducted today via face time.   Chief Complaint: OBESITY Maria Marquez is here to discuss her progress with her obesity treatment plan along with follow-up of her obesity related diagnoses. Maria Marquez is on the Category 2 Plan and states she is following her eating plan approximately 20-25% of the time. Maria Marquez states she is walking for 15 minutes 2-3 times per week.  Today's visit was #: 29 Starting weight: 247 lbs Starting date: 02/22/18  Interim History: Maria Marquez feels she has done well maintaining her weight over the holidays. She feels she is ready to get back on track with her weight loss efforts, and she would like to try something different.  Subjective:   1. Vitamin D deficiency Maria Marquez is stable on Vit D, but she is due to have labs rechecked.  2. Other depression, with emotional eating Maria Marquez states her mood has improved and she feels she is ready to discontinue her Wellbutrin. She denies suicidal ideas or homicidal ideas.  Assessment/Plan:   1. Vitamin D deficiency We will refill prescription Vit D for 1 month- Vitamin D, Ergocalciferol, (DRISDOL) 1.25 MG (50000 UT) CAPS capsule; Take 1 capsule (50,000 Units total) by mouth every 7 (seven) days. Dispense: 4 capsule; Refill: 0. We will recheck labs in 2-3 weeks at her next in office visit. We will continue monitor.  2. Other depression, with emotional eating [name] agrees to taper Wellbutrin to every other day for 10 days and then discontinue all together. We will  continue to follow closely.  3. Class 2 severe obesity with serious comorbidity and body mass index (BMI) of 37.0 to 37.9 in adult, unspecified obesity type ([facility]) Maria Marquez is currently in the action stage of change. As such, her goal is to continue with weight loss efforts. She has agreed to following a lower carbohydrate, vegetable and lean protein rich diet plan. We will send meal plan through my chart.  We discussed the following exercise goals today: Older adults should follow the adult guidelines. When older adults cannot meet the adult guidelines, they should be as physically active as their abilities and conditions will allow.  Older adults should do exercises that maintain or improve balance if they are at risk of falling.   We discussed the following behavioral modification strategies today: decreasing simple carbohydrates and meal planning and cooking strategies.  Maria Marquez has agreed to follow-up with our clinic in 3 weeks. She was informed of the importance of frequent follow-up visits to maximize her success with intensive lifestyle modifications for her multiple health conditions.  Objective:   VITALS: Per patient if applicable, see vitals. GENERAL: Alert and in no acute distress. CARDIOPULMONARY: No increased WOB. Speaking in clear sentences.  PSYCH: Pleasant and cooperative. Speech normal rate and rhythm. Affect is appropriate. Insight and judgement are appropriate. Attention is focused, linear, and appropriate.  NEURO: Oriented as arrived to appointment on time with no prompting.   Lab Results  Component Value Date   CREATININE 1.53 (H) 06/14/2019   BUN 19 06/14/2019   NA 139 06/14/2019  K 4.4 06/14/2019   CL 104 06/14/2019   CO2 27 06/14/2019   Lab Results  Component Value Date   ALT 19 06/14/2019   AST 14 06/14/2019   ALKPHOS 72 06/14/2019   BILITOT 0.8 06/14/2019   Lab Results  Component Value Date   HGBA1C 5.7 (H) 11/01/2018   HGBA1C 5.8 (H)  06/13/2018   HGBA1C 6.4 01/17/2018   HGBA1C 6.1 09/23/2015   Lab Results  Component Value Date   INSULIN 11.7 11/01/2018   INSULIN 15.0 06/13/2018   INSULIN 14.0 02/22/2018   Lab Results  Component Value Date   TSH 2.81 06/14/2019   Lab Results  Component Value Date   CHOL 199 06/14/2019   HDL 40.70 06/14/2019   LDLCALC 129 (H) 06/14/2019   LDLDIRECT 197.0 01/13/2018   TRIG 147.0 06/14/2019   CHOLHDL 5 06/14/2019   Lab Results  Component Value Date   WBC 8.3 06/14/2019   HGB 14.3 06/14/2019   HCT 42.3 06/14/2019   MCV 95.0 06/14/2019   PLT 247.0 06/14/2019   No results found for: IRON, TIBC, FERRITIN  Attestation Statements:   Reviewed by clinician on day of visit: allergies, medications, problem list, medical history, surgical history, family history, social history, and previous encounter notes.   I, Trixie Dredge, am acting as transcriptionist for [name], MD.  I have reviewed the above documentation for accuracy and completeness, and I agree with the above. - [name], MD

## 2019-09-10 ENCOUNTER — Other Ambulatory Visit: Payer: Self-pay

## 2019-09-10 DIAGNOSIS — F3289 Other specified depressive episodes: Secondary | ICD-10-CM

## 2019-09-10 MED ORDER — BUPROPION HCL ER (SR) 200 MG PO TB12: 200.0000 mg | ORAL_TABLET | Freq: Two times a day (BID) | ORAL | 0 refills | Status: DC

## 2019-09-11 DIAGNOSIS — J301 Allergic rhinitis due to pollen: Secondary | ICD-10-CM | POA: Diagnosis not present

## 2019-09-11 DIAGNOSIS — J3089 Other allergic rhinitis: Secondary | ICD-10-CM | POA: Diagnosis not present

## 2019-09-11 DIAGNOSIS — J3081 Allergic rhinitis due to animal (cat) (dog) hair and dander: Secondary | ICD-10-CM | POA: Diagnosis not present

## 2019-09-14 ENCOUNTER — Other Ambulatory Visit: Payer: Self-pay

## 2019-09-17 ENCOUNTER — Encounter: Payer: Self-pay | Admitting: Family Medicine

## 2019-09-17 ENCOUNTER — Other Ambulatory Visit: Payer: Self-pay

## 2019-09-17 ENCOUNTER — Ambulatory Visit (INDEPENDENT_AMBULATORY_CARE_PROVIDER_SITE_OTHER): Payer: Medicare HMO | Admitting: Family Medicine

## 2019-09-17 VITALS — BP 132/76 | HR 82 | Temp 97.6°F | Ht 66.0 in | Wt 242.0 lb

## 2019-09-17 DIAGNOSIS — R311 Benign essential microscopic hematuria: Secondary | ICD-10-CM | POA: Diagnosis not present

## 2019-09-17 DIAGNOSIS — F3289 Other specified depressive episodes: Secondary | ICD-10-CM

## 2019-09-17 DIAGNOSIS — N1832 Chronic kidney disease, stage 3b: Secondary | ICD-10-CM | POA: Diagnosis not present

## 2019-09-17 DIAGNOSIS — F17200 Nicotine dependence, unspecified, uncomplicated: Secondary | ICD-10-CM

## 2019-09-17 DIAGNOSIS — I10 Essential (primary) hypertension: Secondary | ICD-10-CM | POA: Diagnosis not present

## 2019-09-17 LAB — RENAL FUNCTION PANEL
Albumin: 4 g/dL (ref 3.5–5.2)
BUN: 24 mg/dL — ABNORMAL HIGH (ref 6–23)
CO2: 29 mEq/L (ref 19–32)
Calcium: 9.2 mg/dL (ref 8.4–10.5)
Chloride: 103 mEq/L (ref 96–112)
Creatinine, Ser: 1.34 mg/dL — ABNORMAL HIGH (ref 0.40–1.20)
GFR: 38.92 mL/min — ABNORMAL LOW (ref >60.00)
Glucose, Bld: 85 mg/dL (ref 70–99)
Phosphorus: 2.6 mg/dL (ref 2.3–4.6)
Potassium: 4.1 mEq/L (ref 3.5–5.1)
Sodium: 137 mEq/L (ref 135–145)

## 2019-09-17 LAB — POCT URINALYSIS DIPSTICK
Bilirubin, UA: NEGATIVE
Blood, UA: POSITIVE
Glucose, UA: NEGATIVE
Ketones, UA: NEGATIVE
Leukocytes, UA: NEGATIVE
Nitrite, UA: NEGATIVE
Protein, UA: NEGATIVE
Spec Grav, UA: 1.02 (ref 1.010–1.025)
Urobilinogen, UA: 0.2 E.U./dL
pH, UA: 5.5 (ref 5.0–8.0)

## 2019-09-17 NOTE — Patient Instructions (Signed)
Please return in 6 months for follow up of your hypertension.  I will release your lab results to you on your MyChart account with further instructions. Please reply with any questions.  I will schedule an ultrasound of your kidneys if needed.   If you have any questions or concerns, please don't hesitate to send me a message via MyChart or call the office at [phone]. Thank you for visiting with [name] today! It's our pleasure caring for you.

## 2019-09-17 NOTE — Progress Notes (Signed)
Subjective  CC:  Chief Complaint  Patient presents with  . Follow-up    Recheck Kidney function   . Hypertension    HPI: Maria Marquez is a 74 y.o. female who presents to the office today to address the problems listed above in the chief complaint.  Hypertension f/u: Control is good . Pt reports she is doing well. taking medications as instructed, no medication side effects noted, no TIAs, no chest pain on exertion, no dyspnea on exertion, no swelling of ankles. Home readings are on avg 117/62. She denies adverse effects from his BP medications. Compliance with medication is good.   CKD: last renal function was a bid decreased. New problem. No sxs of renal insufficiency. Long standing hypertension as likely cause. Here for recheck.   Obesity: has regained her weight due to covid/at home all the time and cooking/bored. To f/u with Dr Leafy Ro.   Stress/reactive depression: had been on wellbutrin x aobut a year but not finding it helpful. Stopped it 2 weeks ago. Feels fine. Wishes covid restrictions were over but handling appropriately.   Still smoking. Unlikely to be successful with cessation at this time. No improvement with wellbutrin.   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   Assessment  1. Stage 3b chronic kidney disease   2. Essential hypertension   3. Other depression, with emotional eating   4. Tobacco dependence   5. Benign microscopic hematuria      Plan    Hypertension f/u: BP control is well controlled. Continue current meds  Hyperlipidemia f/u: on zetia. rec low chol diet  Obesity: too start working on limiting portions again  CKD: recheck levels with urine and check ultrasound if persists.   Smoker.  Education regarding management of these chronic disease states was given. Management strategies discussed on successive visits include dietary and exercise recommendations, goals of achieving and  maintaining IBW, and lifestyle modifications aiming for adequate sleep and minimizing stressors.   Follow up: Return in about 6 months (around 03/16/2020) for follow up Hypertension.  Orders Placed This Encounter  Procedures  . Renal function panel  . POCT urinalysis dipstick   No orders of the defined types were placed in this encounter.     BP Readings from Last 3 Encounters:  09/17/19 132/76  06/14/19 120/70  04/14/19 (!) 162/63   Wt Readings from Last 3 Encounters:  09/17/19 242 lb (109.8 kg)  06/14/19 235 lb 3.2 oz (106.7 kg)  11/01/18 222 lb (100.7 kg)    Lab Results  Component Value Date   CHOL 199 06/14/2019   CHOL 222 (H) 11/01/2018   CHOL 238 (H) 06/13/2018   Lab Results  Component Value Date   HDL 40.70 06/14/2019   HDL 40 11/01/2018   HDL 45 06/13/2018   Lab Results  Component Value Date   LDLCALC 129 (H) 06/14/2019   LDLCALC 151 (H) 11/01/2018   LDLCALC 156 (H) 06/13/2018   Lab Results  Component Value Date   TRIG 147.0 06/14/2019   TRIG 155 (H) 11/01/2018   TRIG 183 (H) 06/13/2018   Lab Results  Component Value Date   CHOLHDL 5 06/14/2019   CHOLHDL 4.9 (H) 02/22/2018   CHOLHDL 7 01/13/2018   Lab Results  Component Value Date   LDLDIRECT 197.0 01/13/2018   Lab Results  Component Value Date   CREATININE 1.53 (H) 06/14/2019   BUN 19 06/14/2019   NA 139 06/14/2019  K 4.4 06/14/2019   CL 104 06/14/2019   CO2 27 06/14/2019    The 10-year ASCVD risk score Mikey Bussing DC Jr., et al., 2013) is: 23.7%   Values used to calculate the score:     Age: 64 years     Sex: Female     Is Non-Hispanic African American: No     Diabetic: No     Tobacco smoker: Yes     Systolic Blood Pressure: 253 mmHg     Is BP treated: Yes     HDL Cholesterol: 40.7 mg/dL     Total Cholesterol: 199 mg/dL  I reviewed the patients updated PMH, FH, and SocHx.    Patient Active Problem List   Diagnosis Date Noted  . Tobacco dependence 12/09/2015    Priority: High  .  Essential hypertension 06/17/2015    Priority: High  . Mixed hyperlipidemia 06/17/2015    Priority: High  . Morbid obesity ([place]) 06/17/2015    Priority: High  . Post-menopause on HRT (hormone replacement therapy) 06/17/2015    Priority: High  . Postoperative hypothyroidism 06/17/2015    Priority: High  . Primary osteoarthritis involving multiple joints 06/17/2015    Priority: High  . Sessile colonic polyp 10/04/2017    Priority: Medium  . Status post right hip replacement 07/21/2015    Priority: Medium  . ACE inhibitor intolerance 06/17/2015    Priority: Medium  . DJD (degenerative joint disease), lumbar 06/17/2015    Priority: Medium  . Gastroesophageal reflux disease without esophagitis 06/17/2015    Priority: Medium  . Umbilical hernia 66/44/0347    Priority: Low  . Hematuria 07/28/2015    Priority: Low  . Benign microscopic hematuria 09/17/2019  . Myalgia due to statin 11/23/2018    Allergies: Rosuvastatin, Shellfish allergy, Sulfa antibiotics, Latex, and Lisinopril  Social History: Patient  reports that she has been smoking cigarettes. She has been smoking about 0.25 packs per day. She uses smokeless tobacco. She reports current alcohol use. She reports that she does not use drugs.  Current Meds  Medication Sig  . albuterol (VENTOLIN HFA) 108 (90 Base) MCG/ACT inhaler Inhale 1-2 puffs into the lungs every 6 (six) hours as needed for wheezing or shortness of breath.  [name] aspirin 81 MG chewable tablet Chew by mouth daily.  [name] EPINEPHrine (EPIPEN 2-PAK) 0.3 mg/0.3 mL IJ SOAJ injection as needed.  [name] estradiol-norethindrone (ACTIVELLA) 1-0.5 MG tablet Take 1 tablet by mouth daily.  [name] ezetimibe (ZETIA) 10 MG tablet TAKE 1 TABLET EVERY DAY  . levothyroxine (SYNTHROID, LEVOTHROID) 137 MCG tablet Take 1 tablet (137 mcg total) by mouth at bedtime.  [name] losartan-hydrochlorothiazide (HYZAAR) 50-12.5 MG tablet Take 1 tablet by mouth daily.  . simvastatin (ZOCOR) 10 MG tablet Take 1  tablet (10 mg total) by mouth 2 (two) times a week.  . Vitamin D, Ergocalciferol, (DRISDOL) 1.25 MG (50000 UT) CAPS capsule Take 1 capsule (50,000 Units total) by mouth every 7 (seven) days.    Review of Systems: Cardiovascular: negative for chest pain, palpitations, leg swelling, orthopnea Respiratory: negative for SOB, wheezing or persistent cough Gastrointestinal: negative for abdominal pain Genitourinary: negative for dysuria or gross hematuria  Objective  Vitals: BP 132/76   Pulse 82   Temp 97.6 F (36.4 C) (Temporal)   Ht 5\' 6"  (1.676 m)   Wt 242 lb (109.8 kg)   SpO2 95%   BMI 39.06 kg/m  General: no acute distress  Psych:  Alert and oriented, normal mood and affect HEENT:  Normocephalic, atraumatic, supple neck  Cardiovascular:  RRR without murmur. no edema. No rub Respiratory:  Good breath sounds bilaterally, CTAB with normal respiratory effort Skin:  Warm, no rashes Neurologic:   Mental status is normal  Commons side effects, risks, benefits, and alternatives for medications and treatment plan prescribed today were discussed, and the patient expressed understanding of the given instructions. Patient is instructed to call or message via MyChart if he/she has any questions or concerns regarding our treatment plan. No barriers to understanding were identified. We discussed Red Flag symptoms and signs in detail. Patient expressed understanding regarding what to do in case of urgent or emergency type symptoms.   Medication list was reconciled, printed and provided to the patient in AVS. Patient instructions and summary information was reviewed with the patient as documented in the AVS. This note was prepared with assistance of Dragon voice recognition software. Occasional wrong-word or sound-a-like substitutions may have occurred due to the inherent limitations of voice recognition software  This visit occurred during the SARS-CoV-2 public health emergency.  Safety protocols were  in place, including screening questions prior to the visit, additional usage of staff PPE, and extensive cleaning of exam room while observing appropriate contact time as indicated for disinfecting solutions.

## 2019-09-19 ENCOUNTER — Encounter (INDEPENDENT_AMBULATORY_CARE_PROVIDER_SITE_OTHER): Payer: Self-pay | Admitting: Family Medicine

## 2019-09-19 ENCOUNTER — Other Ambulatory Visit: Payer: Self-pay

## 2019-09-19 ENCOUNTER — Ambulatory Visit (INDEPENDENT_AMBULATORY_CARE_PROVIDER_SITE_OTHER): Payer: Medicare HMO | Admitting: Family Medicine

## 2019-09-19 VITALS — BP 132/75 | HR 78 | Temp 97.8°F | Ht 66.0 in | Wt 240.0 lb

## 2019-09-19 DIAGNOSIS — Z6839 Body mass index (BMI) 39.0-39.9, adult: Secondary | ICD-10-CM

## 2019-09-19 DIAGNOSIS — E559 Vitamin D deficiency, unspecified: Secondary | ICD-10-CM | POA: Diagnosis not present

## 2019-09-19 DIAGNOSIS — F3289 Other specified depressive episodes: Secondary | ICD-10-CM

## 2019-09-19 MED ORDER — VITAMIN D (ERGOCALCIFEROL) 1.25 MG (50000 UNIT) PO CAPS: 50000.0000 [IU] | ORAL_CAPSULE | ORAL | 0 refills | Status: DC

## 2019-09-20 NOTE — Progress Notes (Signed)
Chief Complaint:   OBESITY Maria Marquez is here to discuss her progress with her obesity treatment plan along with follow-up of her obesity related diagnoses. [name] is on the Category 2 Plan and states she is following her eating plan approximately 20-25% of the time. [name] states she is doing 0 minutes 0 times per week.  Today's visit was #: 63 Starting weight: 247 lbs Starting date: 02/22/18 Today's weight: 240 lbs Today's date: 09/19/2019 Total lbs lost to date: 7 Total lbs lost since last in-office visit: 0  Interim History: [name]'s last visit in the office was 10 months ago before the pandemic. She is a social person and has really struggled with feeling isolated and emotional eating. She feels she is ready to get back on track and would like to know if she is ok to go back to the gym.  Subjective:   1. Vitamin D deficiency [name] is stable on Vit D, but level is not yet at goal. She denies nausea, vomiting, or muscle weakness.  2. Other depression, with emotional eating [name] discontinued her Wellbutrin and she feels she is doing ok without it, but she still struggles with emotional eating and some sadness. She would like to stay off Wellbutrin for now.  Assessment/Plan:   1. Vitamin D deficiency Low Vitamin D level contributes to fatigue and are associated with obesity, breast, and colon cancer. We will refill prescription Vitamin D for 1 month, and we will recheck labs next month. [name] will follow-up for routine testing of Vitamin D, at least 2-3 times per year to avoid over-replacement.  - Vitamin D, Ergocalciferol, (DRISDOL) 1.25 MG (50000 UNIT) CAPS capsule; Take 1 capsule (50,000 Units total) by mouth every 7 (seven) days.  Dispense: 4 capsule; Refill: 0  2. Other depression, with emotional eating Cognitive behavior modification techniques were discussed today to help [name] decrease her emotional/non-hunger eating behaviors. Orders and follow up as documented in patient  record.   3. Class 2 severe obesity with serious comorbidity and body mass index (BMI) of 39.0 to 39.9 in adult, unspecified obesity type (HCC) [name] is currently in the action stage of change. As such, her goal is to continue with weight loss efforts. She has agreed to the Category 2 Plan.   Exercise goals: I advised [name] to not exercise in public at this time, and instead to look at home exercises she can do.  Behavioral modification strategies: emotional eating strategies.  [name] has agreed to follow-up with our clinic in 2 to 3 weeks. She was informed of the importance of frequent follow-up visits to maximize her success with intensive lifestyle modifications for her multiple health conditions.   Objective:   Blood pressure 132/75, pulse 78, temperature 97.8 F (36.6 C), temperature source Oral, height 5\' 6"  (1.676 m), weight 240 lb (108.9 kg), SpO2 99 %. Body mass index is 38.74 kg/m.  General: Cooperative, alert, well developed, in no acute distress. HEENT: Conjunctivae and lids unremarkable. Cardiovascular: Regular rhythm.  Lungs: Normal work of breathing. Neurologic: No focal deficits.   Lab Results  Component Value Date   CREATININE 1.34 (H) 09/17/2019   BUN 24 (H) 09/17/2019   NA 137 09/17/2019   K 4.1 09/17/2019   CL 103 09/17/2019   CO2 29 09/17/2019   Lab Results  Component Value Date   ALT 19 06/14/2019   AST 14 06/14/2019   ALKPHOS 72 06/14/2019   BILITOT 0.8 06/14/2019   Lab Results  Component Value Date  HGBA1C 5.7 (H) 11/01/2018   HGBA1C 5.8 (H) 06/13/2018   HGBA1C 6.4 01/17/2018   HGBA1C 6.1 09/23/2015   Lab Results  Component Value Date   INSULIN 11.7 11/01/2018   INSULIN 15.0 06/13/2018   INSULIN 14.0 02/22/2018   Lab Results  Component Value Date   TSH 2.81 06/14/2019   Lab Results  Component Value Date   CHOL 199 06/14/2019   HDL 40.70 06/14/2019   LDLCALC 129 (H) 06/14/2019   LDLDIRECT 197.0 01/13/2018   TRIG 147.0 06/14/2019    CHOLHDL 5 06/14/2019   Lab Results  Component Value Date   WBC 8.3 06/14/2019   HGB 14.3 06/14/2019   HCT 42.3 06/14/2019   MCV 95.0 06/14/2019   PLT 247.0 06/14/2019   No results found for: IRON, TIBC, FERRITIN  Obesity Behavioral Intervention Documentation for Insurance:   Approximately 15 minutes were spent on the discussion below.  ASK: We discussed the diagnosis of obesity with [name] today and Nashya agreed to give [name] permission to discuss obesity behavioral modification therapy today.  ASSESS: Chiquitta has the diagnosis of obesity and her BMI today is 38.76. Phelan is in the action stage of change.   ADVISE: [name] was educated on the multiple health risks of obesity as well as the benefit of weight loss to improve her health. She was advised of the need for long term treatment and the importance of lifestyle modifications to improve her current health and to decrease her risk of future health problems.  AGREE: Multiple dietary modification options and treatment options were discussed and [name] agreed to follow the recommendations documented in the above note.  ARRANGE: [name] was educated on the importance of frequent visits to treat obesity as outlined per CMS and USPSTF guidelines and agreed to schedule her next follow up appointment today.  Attestation Statements:   Reviewed by clinician on day of visit: allergies, medications, problem list, medical history, surgical history, family history, social history, and previous encounter notes.   I, Trixie Dredge, am acting as transcriptionist for [name], MD.  I have reviewed the above documentation for accuracy and completeness, and I agree with the above. -  [name], MD

## 2019-09-28 ENCOUNTER — Other Ambulatory Visit (INDEPENDENT_AMBULATORY_CARE_PROVIDER_SITE_OTHER): Payer: Self-pay | Admitting: Family Medicine

## 2019-09-28 DIAGNOSIS — E559 Vitamin D deficiency, unspecified: Secondary | ICD-10-CM

## 2019-10-02 ENCOUNTER — Other Ambulatory Visit: Payer: Self-pay

## 2019-10-03 ENCOUNTER — Ambulatory Visit (INDEPENDENT_AMBULATORY_CARE_PROVIDER_SITE_OTHER): Payer: Medicare HMO | Admitting: Family Medicine

## 2019-10-03 ENCOUNTER — Encounter: Payer: Self-pay | Admitting: Family Medicine

## 2019-10-03 ENCOUNTER — Ambulatory Visit (INDEPENDENT_AMBULATORY_CARE_PROVIDER_SITE_OTHER): Payer: Medicare HMO

## 2019-10-03 VITALS — BP 122/82 | HR 77 | Temp 97.6°F | Ht 66.0 in | Wt 252.8 lb

## 2019-10-03 DIAGNOSIS — M545 Low back pain, unspecified: Secondary | ICD-10-CM

## 2019-10-03 DIAGNOSIS — R600 Localized edema: Secondary | ICD-10-CM | POA: Diagnosis not present

## 2019-10-03 DIAGNOSIS — M47816 Spondylosis without myelopathy or radiculopathy, lumbar region: Secondary | ICD-10-CM

## 2019-10-03 DIAGNOSIS — I10 Essential (primary) hypertension: Secondary | ICD-10-CM | POA: Diagnosis not present

## 2019-10-03 MED ORDER — FUROSEMIDE 20 MG PO TABS: ORAL_TABLET | ORAL | 0 refills | Status: DC

## 2019-10-03 MED ORDER — GABAPENTIN 300 MG PO CAPS: 300.0000 mg | ORAL_CAPSULE | Freq: Every day | ORAL | 0 refills | Status: DC

## 2019-10-03 MED ORDER — TRAMADOL HCL 50 MG PO TABS: 50.0000 mg | ORAL_TABLET | Freq: Four times a day (QID) | ORAL | 0 refills | Status: DC | PRN

## 2019-10-03 NOTE — Progress Notes (Signed)
Subjective  CC:  Chief Complaint  Patient presents with  . Back Pain    chronic back pain while standing more than 5 mins.    HPI: Maria Marquez is a 74 y.o. female who presents to the office today to address the problems listed above in the chief complaint.  74 yo w/ long history of low back pain and reported h/o "bulging disc" and h/o sciatica with recent 3-4 week flare, worsening. Pain with standing, limiting her ability to do things. otc meds and ice/heat have not been helpful. Describes pressure and pain in mid lumbar area that radiates around to the groin. No pain down either leg. Has pain at buttocks as well. No b/b/ dysfunction. No injury or overuse. Sleeps well but awakens with the pain. Movement makes it worse. Has been on nsaids and now with dependent bilateral lower ext edema. No calf pain. Has HTN but no h/o chf or swelling. On ace and hctz. Has ckd, stage 3. Recent labs reviewed below. No sob, cp or palpitations. Has gained weight back; feeling down.    Assessment  1. Acute midline low back pain without sciatica   2. Spondylosis of lumbar region without myelopathy or radiculopathy   3. Lower extremity edema   4. Essential hypertension      Plan   Back pain/djd:  Check xrays. Start neurontin and ultram. Avoid nsaids. Pt declines steroids at this time due to side effects. Start PT. Close f/u.   Dependent edema in setting of nsaid use: intermittent lasix and follow. Further eval if persists after stopping nsaids. No red flags.  HTN: controlled.   Monitor mood. H/o depression but prefers not to be on meds.    Follow up: Return in about 3 weeks (around 10/24/2019) for recheck.  12/14/2019  Orders Placed This Encounter  Procedures  . DG Lumbar Spine Complete  . Ambulatory referral to Physical Therapy   Meds ordered this encounter  Medications  . gabapentin (NEURONTIN) 300 MG capsule    Sig: Take 1 capsule (300 mg total) by mouth at bedtime.    Dispense:  90 capsule      Refill:  0  . traMADol (ULTRAM) 50 MG tablet    Sig: Take 1 tablet (50 mg total) by mouth every 6 (six) hours as needed for moderate pain.    Dispense:  30 tablet    Refill:  0  . furosemide (LASIX) 20 MG tablet    Sig: Take 1 tab 2-3x/week as needed for swelling    Dispense:  30 tablet    Refill:  0      I reviewed the patients updated PMH, FH, and SocHx.    Patient Active Problem List   Diagnosis Date Noted  . Tobacco dependence 12/09/2015    Priority: High  . Essential hypertension 06/17/2015    Priority: High  . Mixed hyperlipidemia 06/17/2015    Priority: High  . Morbid obesity ([place]) 06/17/2015    Priority: High  . Post-menopause on HRT (hormone replacement therapy) 06/17/2015    Priority: High  . Postoperative hypothyroidism 06/17/2015    Priority: High  . Primary osteoarthritis involving multiple joints 06/17/2015    Priority: High  . Sessile colonic polyp 10/04/2017    Priority: Medium  . Status post right hip replacement 07/21/2015    Priority: Medium  . ACE inhibitor intolerance 06/17/2015    Priority: Medium  . DJD (degenerative joint disease), lumbar 06/17/2015    Priority: Medium  . Gastroesophageal reflux  disease without esophagitis 06/17/2015    Priority: Medium  . Umbilical hernia 41/96/2229    Priority: Low  . Hematuria 07/28/2015    Priority: Low  . Benign microscopic hematuria 09/17/2019  . Myalgia due to statin 11/23/2018   Current Meds  Medication Sig  . albuterol (VENTOLIN HFA) 108 (90 Base) MCG/ACT inhaler Inhale 1-2 puffs into the lungs every 6 (six) hours as needed for wheezing or shortness of breath.  [name] aspirin 81 MG chewable tablet Chew by mouth daily.  [name] EPINEPHrine (EPIPEN 2-PAK) 0.3 mg/0.3 mL IJ SOAJ injection as needed.  [name] estradiol-norethindrone (ACTIVELLA) 1-0.5 MG tablet Take 1 tablet by mouth daily.  [name] ezetimibe (ZETIA) 10 MG tablet TAKE 1 TABLET EVERY DAY  . levothyroxine (SYNTHROID, LEVOTHROID) 137 MCG tablet Take 1 tablet  (137 mcg total) by mouth at bedtime.  [name] losartan-hydrochlorothiazide (HYZAAR) 50-12.5 MG tablet Take 1 tablet by mouth daily.  . Probiotic Product (UP4 PROBIOTICS ADULT PO) Take by mouth.  . simvastatin (ZOCOR) 10 MG tablet Take 1 tablet (10 mg total) by mouth 2 (two) times a week.  . Vitamin D, Ergocalciferol, (DRISDOL) 1.25 MG (50000 UNIT) CAPS capsule Take 1 capsule (50,000 Units total) by mouth every 7 (seven) days.    Allergies: Patient is allergic to rosuvastatin; shellfish allergy; sulfa antibiotics; latex; and lisinopril. Family History: Patient family history includes Alcohol abuse in her brother; Arthritis in her mother; Breast cancer in her mother; Diabetes in her father; Heart disease in her brother, brother, and father; Kidney disease in her father; Lung cancer in her brother and brother; Schizophrenia in her mother; Stroke in her sister; Vision loss in her brother. Social History:  Patient  reports that she has been smoking cigarettes. She has been smoking about 0.25 packs per day. She uses smokeless tobacco. She reports current alcohol use. She reports that she does not use drugs.  Review of Systems: Constitutional: Negative for fever malaise or anorexia Cardiovascular: negative for chest pain Respiratory: negative for SOB or persistent cough Gastrointestinal: negative for abdominal pain  Objective  Vitals: BP 122/82 (BP Location: Right Arm, Patient Position: Sitting, Cuff Size: Large)   Pulse 77   Temp 97.6 F (36.4 C) (Temporal)   Ht 5\' 6"  (1.676 m)   Wt 252 lb 12.8 oz (114.7 kg)   SpO2 98%   BMI 40.80 kg/m  General: no acute distress , A&Ox3 HEENT: PEERL, conjunctiva normal, Oropharynx moist,neck is supple Cardiovascular:  RRR without murmur or gallop.  Respiratory:  Good breath sounds bilaterally, CTAB with normal respiratory effort Skin:  Warm, no rashes Back: nontender, no mm spasm. Fairly good rom. Neg slr bilaterally. Ext: bilateral +2 pitting edema to mid  shin, nl pulses  No visits with results within 1 Day(s) from this visit.  Latest known visit with results is:  Office Visit on 09/17/2019  Component Date Value Ref Range Status  . Sodium 09/17/2019 137  135 - 145 mEq/L Final  . Potassium 09/17/2019 4.1  3.5 - 5.1 mEq/L Final  . Chloride 09/17/2019 103  96 - 112 mEq/L Final  . CO2 09/17/2019 29  19 - 32 mEq/L Final  . Albumin 09/17/2019 4.0  3.5 - 5.2 g/dL Final  . BUN 09/17/2019 24* 6 - 23 mg/dL Final  . Creatinine, Ser 09/17/2019 1.34* 0.40 - 1.20 mg/dL Final  . Glucose, Bld 09/17/2019 85  70 - 99 mg/dL Final  . Phosphorus 09/17/2019 2.6  2.3 - 4.6 mg/dL Final  . GFR 09/17/2019 38.92* >60.00  mL/min Final  . Calcium 09/17/2019 9.2  8.4 - 10.5 mg/dL Final  . Color, UA 09/17/2019 Golden   Final  . Clarity, UA 09/17/2019 Cloudy   Final  . Glucose, UA 09/17/2019 Negative  Negative Final  . Bilirubin, UA 09/17/2019 Negative   Final  . Ketones, UA 09/17/2019 Negative   Final  . Spec Grav, UA 09/17/2019 1.020  1.010 - 1.025 Final  . Blood, UA 09/17/2019 Positive   Final  . pH, UA 09/17/2019 5.5  5.0 - 8.0 Final  . Protein, UA 09/17/2019 Negative  Negative Final  . Urobilinogen, UA 09/17/2019 0.2  0.2 or 1.0 E.U./dL Final  . Nitrite, UA 09/17/2019 Negative   Final  . Leukocytes, UA 09/17/2019 Negative  Negative Final      Commons side effects, risks, benefits, and alternatives for medications and treatment plan prescribed today were discussed, and the patient expressed understanding of the given instructions. Patient is instructed to call or message via MyChart if he/she has any questions or concerns regarding our treatment plan. No barriers to understanding were identified. We discussed Red Flag symptoms and signs in detail. Patient expressed understanding regarding what to do in case of urgent or emergency type symptoms.   Medication list was reconciled, printed and provided to the patient in AVS. Patient instructions and summary  information was reviewed with the patient as documented in the AVS. This note was prepared with assistance of Dragon voice recognition software. Occasional wrong-word or sound-a-like substitutions may have occurred due to the inherent limitations of voice recognition software  This visit occurred during the SARS-CoV-2 public health emergency.  Safety protocols were in place, including screening questions prior to the visit, additional usage of staff PPE, and extensive cleaning of exam room while observing appropriate contact time as indicated for disinfecting solutions.

## 2019-10-03 NOTE — Patient Instructions (Signed)
Please return in 2-4 weeks for recheck.  We will call you to get physical therapy started for your back.   Take the medications as prescribed.   If you have any questions or concerns, please don't hesitate to send me a message via MyChart or call the office at [phone]. Thank you for visiting with [name] today! It's our pleasure caring for you.   Back Exercises The following exercises strengthen the muscles that help to support the trunk and back. They also help to keep the lower back flexible. Doing these exercises can help to prevent back pain or lessen existing pain.  If you have back pain or discomfort, try doing these exercises 2-3 times each day or as told by your health care provider.  As your pain improves, do them once each day, but increase the number of times that you repeat the steps for each exercise (do more repetitions).  To prevent the recurrence of back pain, continue to do these exercises once each day or as told by your health care provider. Do exercises exactly as told by your health care provider and adjust them as directed. It is normal to feel mild stretching, pulling, tightness, or discomfort as you do these exercises, but you should stop right away if you feel sudden pain or your pain gets worse. Exercises Single knee to chest Repeat these steps 3-5 times for each leg: 1. Lie on your back on a firm bed or the floor with your legs extended. 2. Bring one knee to your chest. Your other leg should stay extended and in contact with the floor. 3. Hold your knee in place by grabbing your knee or thigh with both hands and hold. 4. Pull on your knee until you feel a gentle stretch in your lower back or buttocks. 5. Hold the stretch for 10-30 seconds. 6. Slowly release and straighten your leg. Pelvic tilt Repeat these steps 5-10 times: 1. Lie on your back on a firm bed or the floor with your legs extended. 2. Bend your knees so they are pointing toward the ceiling and your  feet are flat on the floor. 3. Tighten your lower abdominal muscles to press your lower back against the floor. This motion will tilt your pelvis so your tailbone points up toward the ceiling instead of pointing to your feet or the floor. 4. With gentle tension and even breathing, hold this position for 5-10 seconds. Cat-cow Repeat these steps until your lower back becomes more flexible: 1. Get into a hands-and-knees position on a firm surface. Keep your hands under your shoulders, and keep your knees under your hips. You may place padding under your knees for comfort. 2. Let your head hang down toward your chest. Contract your abdominal muscles and point your tailbone toward the floor so your lower back becomes rounded like the back of a cat. 3. Hold this position for 5 seconds. 4. Slowly lift your head, let your abdominal muscles relax and point your tailbone up toward the ceiling so your back forms a sagging arch like the back of a cow. 5. Hold this position for 5 seconds.  Press-ups Repeat these steps 5-10 times: 1. Lie on your abdomen (face-down) on the floor. 2. Place your palms near your head, about shoulder-width apart. 3. Keeping your back as relaxed as possible and keeping your hips on the floor, slowly straighten your arms to raise the top half of your body and lift your shoulders. Do not use your back muscles to  raise your upper torso. You may adjust the placement of your hands to make yourself more comfortable. 4. Hold this position for 5 seconds while you keep your back relaxed. 5. Slowly return to lying flat on the floor.  Bridges Repeat these steps 10 times: 1. Lie on your back on a firm surface. 2. Bend your knees so they are pointing toward the ceiling and your feet are flat on the floor. Your arms should be flat at your sides, next to your body. 3. Tighten your buttocks muscles and lift your buttocks off the floor until your waist is at almost the same height as your knees.  You should feel the muscles working in your buttocks and the back of your thighs. If you do not feel these muscles, slide your feet 1-2 inches farther away from your buttocks. 4. Hold this position for 3-5 seconds. 5. Slowly lower your hips to the starting position, and allow your buttocks muscles to relax completely. If this exercise is too easy, try doing it with your arms crossed over your chest. Abdominal crunches Repeat these steps 5-10 times: 1. Lie on your back on a firm bed or the floor with your legs extended. 2. Bend your knees so they are pointing toward the ceiling and your feet are flat on the floor. 3. Cross your arms over your chest. 4. Tip your chin slightly toward your chest without bending your neck. 5. Tighten your abdominal muscles and slowly raise your trunk (torso) high enough to lift your shoulder blades a tiny bit off the floor. Avoid raising your torso higher than that because it can put too much stress on your low back and does not help to strengthen your abdominal muscles. 6. Slowly return to your starting position. Back lifts Repeat these steps 5-10 times: 1. Lie on your abdomen (face-down) with your arms at your sides, and rest your forehead on the floor. 2. Tighten the muscles in your legs and your buttocks. 3. Slowly lift your chest off the floor while you keep your hips pressed to the floor. Keep the back of your head in line with the curve in your back. Your eyes should be looking at the floor. 4. Hold this position for 3-5 seconds. 5. Slowly return to your starting position. Contact a health care provider if:  Your back pain or discomfort gets much worse when you do an exercise.  Your worsening back pain or discomfort does not lessen within 2 hours after you exercise. If you have any of these problems, stop doing these exercises right away. Do not do them again unless your health care provider says that you can. Get help right away if:  You develop sudden,  severe back pain. If this happens, stop doing the exercises right away. Do not do them again unless your health care provider says that you can. This information is not intended to replace advice given to you by your health care provider. Make sure you discuss any questions you have with your health care provider. Document Revised: 12/14/2018 Document Reviewed: 05/11/2018 Elsevier Patient Education  [place].

## 2019-10-08 ENCOUNTER — Encounter (INDEPENDENT_AMBULATORY_CARE_PROVIDER_SITE_OTHER): Payer: Self-pay | Admitting: Family Medicine

## 2019-10-08 ENCOUNTER — Other Ambulatory Visit: Payer: Self-pay

## 2019-10-08 ENCOUNTER — Ambulatory Visit (INDEPENDENT_AMBULATORY_CARE_PROVIDER_SITE_OTHER): Payer: Medicare HMO | Admitting: Family Medicine

## 2019-10-08 ENCOUNTER — Other Ambulatory Visit: Payer: Self-pay | Admitting: Family Medicine

## 2019-10-08 VITALS — BP 110/68 | HR 77 | Temp 98.0°F | Ht 66.0 in | Wt 244.0 lb

## 2019-10-08 DIAGNOSIS — Z6839 Body mass index (BMI) 39.0-39.9, adult: Secondary | ICD-10-CM | POA: Diagnosis not present

## 2019-10-08 DIAGNOSIS — F3289 Other specified depressive episodes: Secondary | ICD-10-CM | POA: Diagnosis not present

## 2019-10-09 ENCOUNTER — Other Ambulatory Visit: Payer: Self-pay | Admitting: Family Medicine

## 2019-10-09 DIAGNOSIS — Z1231 Encounter for screening mammogram for malignant neoplasm of breast: Secondary | ICD-10-CM

## 2019-10-09 NOTE — Progress Notes (Signed)
Chief Complaint:   OBESITY Maria Marquez is here to discuss her progress with her obesity treatment plan along with follow-up of her obesity related diagnoses. Maria Marquez is on the Category 2 Plan and states she is following her eating plan approximately 40% of the time. Maria Marquez states she is doing 0 minutes 0 times per week.  Today's visit was #: 58 Starting weight: 244 lbs Starting date: 02/22/18 Today's weight: 247 lbs Today's date: 10/08/2019 Total lbs lost to date: 3 Total lbs lost since last in-office visit: 0  Interim History: Maria Marquez is retaining some fluid today. She notes increased lower extremity edema. She was evaluated by Dr. Jonni Marquez recently and she was given Lasix to use as needed. She notes her motivation has decreased and she is  Frustrated with herself.  Subjective:   1. Other depression, with emotional eating Maria Marquez's mood is still low, and she discontinued her Wellbutrin and is resistant to restarting it. She feels she has been binge eating more in the evenings and she is willing to discuss this with Dr. Mallie Marquez, our Bariatric Psychologist. She denies suicidal ideas or homicidal ideas.   Assessment/Plan:   1. Other depression, with emotional eating Behavior modification techniques were discussed today to help Maria Marquez deal with her emotional/non-hunger eating behaviors. We will refer to Dr. Mallie Marquez, our Bariatric Psychologist for evaluation. Orders and follow up as documented in patient record.   2. Class 2 severe obesity with serious comorbidity and body mass index (BMI) of 39.0 to 39.9 in adult, unspecified obesity type (HCC) Maria Marquez is currently in the action stage of change. As such, her goal is to maintain weight for now. She has agreed to practicing portion control and making smarter food choices, such as increasing vegetables and decreasing simple carbohydrates.   Maria Marquez is to work on her mood and emotional eating with Dr. Mallie Marquez. She will try to maintain for now.  Behavioral  modification strategies: increasing lean protein intake and emotional eating strategies.  Maria Marquez has agreed to follow-up with our clinic in 3 weeks. She was informed of the importance of frequent follow-up visits to maximize her success with intensive lifestyle modifications for her multiple health conditions.   Objective:   Blood pressure 110/68, pulse 77, temperature 98 F (36.7 C), temperature source Oral, height 5\' 6"  (1.676 m), weight 244 lb (110.7 kg), SpO2 99 %. Body mass index is 39.38 kg/m.  General: Cooperative, alert, well developed, in no acute distress. HEENT: Conjunctivae and lids unremarkable. Cardiovascular: Regular rhythm.  Lungs: Normal work of breathing. Neurologic: No focal deficits.   Lab Results  Component Value Date   CREATININE 1.34 (H) 09/17/2019   BUN 24 (H) 09/17/2019   NA 137 09/17/2019   K 4.1 09/17/2019   CL 103 09/17/2019   CO2 29 09/17/2019   Lab Results  Component Value Date   ALT 19 06/14/2019   AST 14 06/14/2019   ALKPHOS 72 06/14/2019   BILITOT 0.8 06/14/2019   Lab Results  Component Value Date   HGBA1C 5.7 (H) 11/01/2018   HGBA1C 5.8 (H) 06/13/2018   HGBA1C 6.4 01/17/2018   HGBA1C 6.1 09/23/2015   Lab Results  Component Value Date   INSULIN 11.7 11/01/2018   INSULIN 15.0 06/13/2018   INSULIN 14.0 02/22/2018   Lab Results  Component Value Date   TSH 2.81 06/14/2019   Lab Results  Component Value Date   CHOL 199 06/14/2019   HDL 40.70 06/14/2019   LDLCALC 129 (H) 06/14/2019   LDLDIRECT  197.0 01/13/2018   TRIG 147.0 06/14/2019   CHOLHDL 5 06/14/2019   Lab Results  Component Value Date   WBC 8.3 06/14/2019   HGB 14.3 06/14/2019   HCT 42.3 06/14/2019   MCV 95.0 06/14/2019   PLT 247.0 06/14/2019   No results found for: IRON, TIBC, FERRITIN  Obesity Behavioral Intervention Documentation for Insurance:   Approximately 15 minutes were spent on the discussion below.  ASK: We discussed the diagnosis of obesity with  [name] today and [name] to give [name] permission to discuss obesity behavioral modification therapy today.  ASSESS: [name] has the diagnosis of obesity and her BMI today is 39.4. [name] is in the action stage of change.   ADVISE: [name] was educated on the multiple health risks of obesity as well as the benefit of weight loss to improve her health. She was advised of the need for long term treatment and the importance of lifestyle modifications to improve her current health and to decrease her risk of future health problems.  AGREE: Multiple dietary modification options and treatment options were discussed and [name] agreed to follow the recommendations documented in the above note.  ARRANGE: [name] was educated on the importance of frequent visits to treat obesity as outlined per CMS and USPSTF guidelines and agreed to schedule her next follow up appointment today.  Attestation Statements:   Reviewed by clinician on day of visit: allergies, medications, problem list, medical history, surgical history, family history, social history, and previous encounter notes.   I, Trixie Dredge, am acting as transcriptionist for [name], MD.  I have reviewed the above documentation for accuracy and completeness, and I agree with the above. -  [name], MD

## 2019-10-10 ENCOUNTER — Other Ambulatory Visit: Payer: Self-pay

## 2019-10-10 ENCOUNTER — Ambulatory Visit
Admission: RE | Admit: 2019-10-10 | Discharge: 2019-10-10 | Disposition: A | Discharge status: Home / Self Care | Payer: Medicare HMO | Source: Ambulatory Visit

## 2019-10-10 ENCOUNTER — Encounter: Payer: Self-pay | Admitting: Family Medicine

## 2019-10-10 DIAGNOSIS — Z1231 Encounter for screening mammogram for malignant neoplasm of breast: Secondary | ICD-10-CM

## 2019-10-12 ENCOUNTER — Other Ambulatory Visit: Payer: Self-pay | Admitting: Family Medicine

## 2019-10-12 ENCOUNTER — Encounter: Payer: Self-pay | Admitting: Family Medicine

## 2019-10-16 ENCOUNTER — Encounter: Payer: Self-pay | Admitting: Family Medicine

## 2019-10-16 ENCOUNTER — Ambulatory Visit: Payer: Medicare HMO | Admitting: Physical Therapy

## 2019-10-18 ENCOUNTER — Telehealth (INDEPENDENT_AMBULATORY_CARE_PROVIDER_SITE_OTHER): Payer: Self-pay | Admitting: Psychology

## 2019-10-18 DIAGNOSIS — F3289 Other specified depressive episodes: Secondary | ICD-10-CM

## 2019-10-18 MED ORDER — HYDROCODONE-ACETAMINOPHEN 5-325 MG PO TABS: 1.0000 | ORAL_TABLET | Freq: Four times a day (QID) | ORAL | 0 refills | Status: DC | PRN

## 2019-10-18 NOTE — Telephone Encounter (Addendum)
  Office: [phone]  /  Fax: [phone]  Date of Call: October 18, 2019  Time of Call: 8:22am Provider: Glennie Isle, PsyD  CONTENT: This provider called Maria Marquez to discuss referral options as this provider will not be able to meet with Maria Marquez for therapeutic services due to concerns related to a dual relationship. A HIPAA complaint voicemail was left requesting a call back.   PLAN: This provider will wait for [name] to call back. No further follow-up planned by this provider.

## 2019-10-18 NOTE — Telephone Encounter (Signed)
  Office: [phone]  /  Fax: [phone]  Date of Call: October 18, 2019  Time of Call: 1:17pm Duration of Call: 8 minutes Provider: Glennie Isle, PsyD  CONTENT: This provider called [name] again as her appointment was scheduled for Monday, October 22, 2019. This provider explained as a psychologist there are ethic codes specific to dual relationships. Bennetta was receptive as evidenced by her stating "I understand." She was also receptive to this provider placing a referral for therapeutic services with [facility] to address emotional eating and coping skills as Ermagene shared she experiences "situational depression." No evidence of suicidal and homicidal ideation, plan, or intent  PLAN: This provider will place a referral for [place] Nilda Riggs) and Shronda's appointment on October 22, 2019 with this provider will be canceled. No further follow-up planned by this provider.

## 2019-10-22 ENCOUNTER — Ambulatory Visit (INDEPENDENT_AMBULATORY_CARE_PROVIDER_SITE_OTHER): Payer: Medicare HMO | Admitting: Psychology

## 2019-10-24 ENCOUNTER — Ambulatory Visit (INDEPENDENT_AMBULATORY_CARE_PROVIDER_SITE_OTHER): Payer: Medicare HMO | Admitting: Family Medicine

## 2019-10-24 ENCOUNTER — Other Ambulatory Visit: Payer: Self-pay

## 2019-10-24 ENCOUNTER — Encounter: Payer: Self-pay | Admitting: Family Medicine

## 2019-10-24 VITALS — BP 122/74 | HR 73 | Temp 97.7°F | Ht 66.0 in | Wt 249.0 lb

## 2019-10-24 DIAGNOSIS — M47816 Spondylosis without myelopathy or radiculopathy, lumbar region: Secondary | ICD-10-CM

## 2019-10-24 DIAGNOSIS — R600 Localized edema: Secondary | ICD-10-CM | POA: Insufficient documentation

## 2019-10-24 DIAGNOSIS — Z789 Other specified health status: Secondary | ICD-10-CM | POA: Insufficient documentation

## 2019-10-24 DIAGNOSIS — F339 Major depressive disorder, recurrent, unspecified: Secondary | ICD-10-CM | POA: Diagnosis not present

## 2019-10-24 MED ORDER — HYDROCHLOROTHIAZIDE 12.5 MG PO CAPS: 12.5000 mg | ORAL_CAPSULE | Freq: Every day | ORAL | 3 refills | Status: DC | PRN

## 2019-10-24 NOTE — Patient Instructions (Signed)
Please return in 3-4 months to recheck your blood pressure and swelling.   If you have any questions or concerns, please don't hesitate to send me a message via MyChart or call the office at [phone]. Thank you for visiting with [name] today! It's our pleasure caring for you.  I've ordered an extra HCTZ mini pill to use in addition to your BP pill as needed for the swelling.   Let me know how your back pain is doing after physical therapy

## 2019-10-24 NOTE — Progress Notes (Signed)
Subjective  CC:  Chief Complaint  Patient presents with  . Back Pain    taking Tylenol. not taking oxycodone due to allergies. back still hurts  . Hypertension    no HA, dizziness, or visual changes  . Edema    stopped taking lasix due to itching all over    HPI: Maria Marquez is a 74 y.o. female who presents to the office today to address the problems listed above in the chief complaint.  Acute on chronic low back pain: we tried tramadol for pain but caused itching w/o rash. She didn't take the gabapentin for fear of same. Asked for norco but decided not to try it either. Using tyelnol for pain. No new sxs but pain is limiting. Can't stand for long. To start PT tomorrow. Reports has seen ortho in past and told arthritis. No records available for review. No dxd of spinal stenosis. Has used tens unit in patch and lidocaine patches. Defers both right now.   Leg swelling: itching with lasix. No sob or orthopnea.    Wt Readings from Last 3 Encounters:  10/24/19 249 lb (112.9 kg)  10/08/19 244 lb (110.7 kg)  10/03/19 252 lb 12.8 oz (114.7 kg)    Assessment  1. Spondylosis of lumbar region without myelopathy or radiculopathy   2. Morbid obesity ([place])   3. Medication intolerance      Plan   Back pain:  Pt elects to try PT. Continue tylenol. Will f/u with me if needed.   Working on weight loss  Active depression doesn't help. To start counseling at healthy weight and wellness  Intolerant to multiple medications. Will increase hctz for leg edema.   Follow up: No follow-ups on file.  10/25/2019  No orders of the defined types were placed in this encounter.  Meds ordered this encounter  Medications  . hydrochlorothiazide (MICROZIDE) 12.5 MG capsule    Sig: Take 1 capsule (12.5 mg total) by mouth daily as needed (swelling).    Dispense:  90 capsule    Refill:  3      I reviewed the patients updated PMH, FH, and SocHx.    Patient Active Problem List   Diagnosis Date  Noted  . Tobacco dependence 12/09/2015    Priority: High  . Essential hypertension 06/17/2015    Priority: High  . Mixed hyperlipidemia 06/17/2015    Priority: High  . Morbid obesity ([place]) 06/17/2015    Priority: High  . Post-menopause on HRT (hormone replacement therapy) 06/17/2015    Priority: High  . Postoperative hypothyroidism 06/17/2015    Priority: High  . Primary osteoarthritis involving multiple joints 06/17/2015    Priority: High  . Sessile colonic polyp 10/04/2017    Priority: Medium  . Status post right hip replacement 07/21/2015    Priority: Medium  . ACE inhibitor intolerance 06/17/2015    Priority: Medium  . DJD (degenerative joint disease), lumbar 06/17/2015    Priority: Medium  . Gastroesophageal reflux disease without esophagitis 06/17/2015    Priority: Medium  . Umbilical hernia 02/40/9735    Priority: Low  . Hematuria 07/28/2015    Priority: Low  . Benign microscopic hematuria 09/17/2019  . Myalgia due to statin 11/23/2018   Current Meds  Medication Sig  . albuterol (VENTOLIN HFA) 108 (90 Base) MCG/ACT inhaler Inhale 1-2 puffs into the lungs every 6 (six) hours as needed for wheezing or shortness of breath.  [name] aspirin 81 MG chewable tablet Chew by mouth daily.  [name] EPINEPHrine (  EPIPEN 2-PAK) 0.3 mg/0.3 mL IJ SOAJ injection as needed.  [name] estradiol-norethindrone (ACTIVELLA) 1-0.5 MG tablet Take 1 tablet by mouth daily.  [name] ezetimibe (ZETIA) 10 MG tablet TAKE 1 TABLET EVERY DAY  . levothyroxine (SYNTHROID) 137 MCG tablet TAKE 1 TABLET (137 MCG TOTAL) BY MOUTH AT BEDTIME.  [name] losartan-hydrochlorothiazide (HYZAAR) 50-12.5 MG tablet Take 1 tablet by mouth daily.  . Probiotic Product (UP4 PROBIOTICS ADULT PO) Take by mouth.  . simvastatin (ZOCOR) 10 MG tablet Take 1 tablet (10 mg total) by mouth 2 (two) times a week.  . Vitamin D, Ergocalciferol, (DRISDOL) 1.25 MG (50000 UNIT) CAPS capsule Take 1 capsule (50,000 Units total) by mouth every 7 (seven) days.     Allergies: Patient is allergic to lasix [furosemide]; rosuvastatin; shellfish allergy; sulfa antibiotics; latex; lisinopril; and tramadol. Family History: Patient family history includes Alcohol abuse in her brother; Arthritis in her mother; Breast cancer in her mother; Diabetes in her father; Heart disease in her brother, brother, and father; Kidney disease in her father; Lung cancer in her brother and brother; Schizophrenia in her mother; Stroke in her sister; Vision loss in her brother. Social History:  Patient  reports that she has been smoking cigarettes. She has been smoking about 0.25 packs per day. She uses smokeless tobacco. She reports current alcohol use. She reports that she does not use drugs.  Review of Systems: Constitutional: Negative for fever malaise or anorexia Cardiovascular: negative for chest pain Respiratory: negative for SOB or persistent cough Gastrointestinal: negative for abdominal pain  Objective  Vitals: BP 122/74 (BP Location: Left Arm, Patient Position: Sitting, Cuff Size: Large)   Pulse 73   Temp 97.7 F (36.5 C) (Temporal)   Ht 5\' 6"  (1.676 m)   Wt 249 lb (112.9 kg)   SpO2 94%   BMI 40.19 kg/m  General: no acute distress , A&Ox3 HEENT: PEERL, conjunctiva normal,  Cardiovascular:  RRR without murmur or gallop. +1 pitting edema bilateral LE Respiratory:  Good breath sounds bilaterally, CTAB with normal respiratory effort Skin:  Warm, no rashes Back: good forward flexion; limited extension. Nl gait.     Commons side effects, risks, benefits, and alternatives for medications and treatment plan prescribed today were discussed, and the patient expressed understanding of the given instructions. Patient is instructed to call or message via MyChart if he/she has any questions or concerns regarding our treatment plan. No barriers to understanding were identified. We discussed Red Flag symptoms and signs in detail. Patient expressed understanding regarding  what to do in case of urgent or emergency type symptoms.   Medication list was reconciled, printed and provided to the patient in AVS. Patient instructions and summary information was reviewed with the patient as documented in the AVS. This note was prepared with assistance of Dragon voice recognition software. Occasional wrong-word or sound-a-like substitutions may have occurred due to the inherent limitations of voice recognition software  This visit occurred during the SARS-CoV-2 public health emergency.  Safety protocols were in place, including screening questions prior to the visit, additional usage of staff PPE, and extensive cleaning of exam room while observing appropriate contact time as indicated for disinfecting solutions.

## 2019-10-25 ENCOUNTER — Ambulatory Visit (INDEPENDENT_AMBULATORY_CARE_PROVIDER_SITE_OTHER): Payer: Medicare HMO | Admitting: Physical Therapy

## 2019-10-25 ENCOUNTER — Encounter: Payer: Self-pay | Admitting: Physical Therapy

## 2019-10-25 DIAGNOSIS — M5441 Lumbago with sciatica, right side: Secondary | ICD-10-CM | POA: Diagnosis not present

## 2019-10-25 DIAGNOSIS — G8929 Other chronic pain: Secondary | ICD-10-CM

## 2019-10-30 ENCOUNTER — Ambulatory Visit (INDEPENDENT_AMBULATORY_CARE_PROVIDER_SITE_OTHER): Payer: Medicare HMO | Admitting: Physical Therapy

## 2019-10-30 ENCOUNTER — Other Ambulatory Visit: Payer: Self-pay

## 2019-10-30 ENCOUNTER — Encounter: Payer: Self-pay | Admitting: Physical Therapy

## 2019-10-30 DIAGNOSIS — M5441 Lumbago with sciatica, right side: Secondary | ICD-10-CM

## 2019-10-30 DIAGNOSIS — G8929 Other chronic pain: Secondary | ICD-10-CM

## 2019-10-30 NOTE — Therapy (Signed)
[facility] [address] [place], [place], [address] Phone: [phone]   Fax:  [phone]  Physical Therapy Evaluation  Patient Details  Name: Maria Marquez MRN: [id number] Date of Birth: Jan 18, 1946 Referring Provider (PT): Billey Chang   Encounter Date: 10/25/2019  PT End of Session - 10/30/19 0819    Visit Number  1    Number of Visits  12    Date for PT Re-Evaluation  12/06/19    Authorization Type  [employer]    PT Start Time  1215    PT Stop Time  1300    PT Time Calculation (min)  45 min    Activity Tolerance  Patient tolerated treatment well    Behavior During Therapy  [facility] for tasks assessed/performed       Past Medical History:  Diagnosis Date  . Acute blood loss anemia   . Arthritis   . Back pain   . Bilious vomiting with nausea   . Constipation   . GERD (gastroesophageal reflux disease)   . HLD (hyperlipidemia)   . Hypertension   . Hypothyroid   . Joint pain   . Leg edema   . Leukocytosis   . Multiple food allergies   . Myalgia due to statin 11/23/2018  . Osteoarthritis of hip    Right  . Postmenopausal hormone replacement therapy 06/17/2015  . Prediabetes   . Sessile colonic polyp 10/04/2017   Colonoscopy 12/2015, Dr. Fuller Plan, repeat q 5 years.    Past Surgical History:  Procedure Laterality Date  . EYE SURGERY  1987  . HERNIA REPAIR    . THYROID LOBECTOMY     right side  . TOTAL HIP ARTHROPLASTY Right 01/28/2016   Procedure: RIGHT TOTAL HIP ARTHROPLASTY ANTERIOR APPROACH;  Surgeon: [name], MD;  Location: [facility];  Service: Orthopedics;  Laterality: Right;    There were no vitals filed for this visit.   Subjective Assessment - 10/30/19 0816    Subjective  Pt reports chronic back pain. Has been better in past years. Retired. Was exercising more, at [employer], likes pool, has been much less active this year, weight gain. Pt states significant increase in pain recently, difficulty standing and doing  house work.    Pertinent History  Prev R THA, medicine sensitivities , recent weight gain    Limitations  Lifting;Standing;Walking;House hold activities    How long can you stand comfortably?  5 min    Patient Stated Goals  Decreased pain    Currently in Pain?  Yes    Pain Score  8     Pain Location  Back    Pain Orientation  Left;Right    Pain Descriptors / Indicators  Aching    Pain Type  Chronic pain    Pain Onset  More than a month ago    Pain Frequency  Intermittent    Aggravating Factors   standing, bending, house work, putting sheets on bed    Pain Relieving Factors  sitting, rest         [facility] PT Assessment - 10/30/19 0001      Assessment   Medical Diagnosis  Low Back Pain    Referring Provider (PT)  Billey Chang    Prior Therapy  no      Balance Screen   Has the patient fallen in the past 6 months  No      Prior Function   Level of Independence  Independent  Cognition   Overall Cognitive Status  Within Functional Limits for tasks assessed      AROM   Overall AROM Comments  Hip: L: WNL,  R: mild limitation for rotation     Lumbar Flexion  wfl    Lumbar Extension  mod limitation/pain    Lumbar - Right Side Bend  mild limitation    Lumbar - Left Side Bend  mild limitation    Lumbar - Right Rotation  WFL    Lumbar - Left Rotation  WFL      Strength   Overall Strength Comments  R hip flex: 4-/5, abd: 4/5,    L hip: 4/5       Palpation   Palpation comment  Pain at central lumbar region, with PAs., Hypomobile lumbar and thoracic spine, Tenderness into R glute .       Special Tests   Other special tests  Neg SLR                Objective measurements completed on examination: See above findings.      [place] Adult PT Treatment/Exercise - 10/30/19 0001      Exercises   Exercises  Lumbar      Lumbar Exercises: Stretches   Active Hamstring Stretch  2 reps;30 seconds    Active Hamstring Stretch Limitations  seated    Single Knee to Chest  Stretch  2 reps;30 seconds    Pelvic Tilt  20 reps    Figure 4 Stretch  2 reps;30 seconds    Figure 4 Stretch Limitations  supine, modified              PT Education - 10/30/19 0818    Education Details  PT POC, HEP, Exam findings.    Person(s) Educated  Patient    Methods  Explanation;Demonstration;Verbal cues;Handout;Tactile cues    Comprehension  Verbalized understanding;Returned demonstration;Verbal cues required;Need further instruction       PT Short Term Goals - 10/30/19 8768      PT SHORT TERM GOAL #1   Title  Pt to be independent with initial HEP    Time  2    Period  Weeks    Status  New    Target Date  11/08/19      PT SHORT TERM GOAL #2   Title  Pt to demo ability to self correct seated posture in clinic at least 75 % of the time.    Time  2    Period  Weeks    Status  New    Target Date  11/08/19        PT Long Term Goals - 10/30/19 0822      PT LONG TERM GOAL #1   Title  Pt to be independent with final HEP    Time  6    Period  Weeks    Status  New    Target Date  12/06/19      PT LONG TERM GOAL #2   Title  Pt to report decreased pain in low back to 0-3/10 with activity    Time  6    Period  Weeks    Status  New    Target Date  12/06/19      PT LONG TERM GOAL #3   Title  Pt to demo proper mechanics for bend/lift, to improve ability and pain with IADLs    Time  6    Period  Weeks    Status  New    Target Date  12/06/19      PT LONG TERM GOAL #4   Title  Pt to report ability for ambulation up to 1 mile, without pain greater than 3/10 in back, to improve ability for exercise and functional activity    Time  6    Period  Weeks    Status  New    Target Date  12/06/19             Plan - 10/30/19 0831    Clinical Impression Statement  Pt presents with primary complaint of increased pain in low back, and into R glute. Pt with poor seated posture, and poor posture with bend, lift and housework. Pt with lack of effective HEP for her  Dx. She has decreased ROM and pain in lumbar spine, and decreased strength. Pt with decreased ability for full functional activities, bending, lifting, walking, IADLs, and exercies, due to pain. Pt to benefit from skilled PT to improve.    Personal Factors and Comorbidities  Fitness;Time since onset of injury/illness/exacerbation    Examination-Activity Limitations  Bathing;Locomotion Level;Bend;Sit;Squat;Stand;Lift    Examination-Participation Restrictions  Meal Prep;Cleaning;Community Activity;Shop;Laundry    Stability/Clinical Decision Making  Stable/Uncomplicated    Clinical Decision Making  Low    Rehab Potential  Good    PT Frequency  2x / week    PT Duration  6 weeks    PT Treatment/Interventions  ADLs/Self Care Home Management;Canalith Repostioning;Cryotherapy;Electrical Stimulation;Iontophoresis 4mg /ml Dexamethasone;Moist Heat;Traction;Ultrasound;DME Instruction;Gait training;Neuromuscular re-education;Balance training;Therapeutic exercise;Therapeutic activities;Functional mobility training;Stair training;Patient/family education;Manual techniques;Taping;Energy conservation;Dry needling;Passive range of motion;Spinal Manipulations;Joint Manipulations;Vasopneumatic Device    Consulted and Agree with Plan of Care  Patient       Patient will benefit from skilled therapeutic intervention in order to improve the following deficits and impairments:  Decreased range of motion, Difficulty walking, Increased muscle spasms, Decreased knowledge of precautions, Decreased activity tolerance, Pain, Impaired flexibility, Improper body mechanics, Decreased balance, Decreased mobility, Decreased strength  Visit Diagnosis: Chronic bilateral low back pain with right-sided sciatica     Problem List Patient Active Problem List   Diagnosis Date Noted  . Major depression, recurrent, chronic ([place]) 10/24/2019  . Leg edema 10/24/2019  . Medication intolerance 10/24/2019  . Benign microscopic hematuria  09/17/2019  . Myalgia due to statin 11/23/2018  . Umbilical hernia 03/54/6568  . Sessile colonic polyp 10/04/2017  . Tobacco dependence 12/09/2015  . Hematuria 07/28/2015  . Status post right hip replacement 07/21/2015  . ACE inhibitor intolerance 06/17/2015  . DJD (degenerative joint disease), lumbar 06/17/2015  . Essential hypertension 06/17/2015  . Gastroesophageal reflux disease without esophagitis 06/17/2015  . Mixed hyperlipidemia 06/17/2015  . Morbid obesity ([place]) 06/17/2015  . Post-menopause on HRT (hormone replacement therapy) 06/17/2015  . 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 Primary osteoarthritis involving multiple joints 06/17/2015    Lyndee Hensen, PT, DPT 8:39 AM  10/30/19    [facility] [place], [place], [address] Phone: [phone]   Fax:  [phone]  Name: Maria Marquez MRN: [id number] Date of Birth: 08-09-1946

## 2019-10-30 NOTE — Therapy (Signed)
[facility] [address] [place], [place], [address] Phone: [phone]   Fax:  [phone]  Physical Therapy Treatment  Patient Details  Name: Maria Marquez MRN: [id number] Date of Birth: 07/04/1946 Referring Provider (PT): Billey Chang   Encounter Date: 10/30/2019  PT End of Session - 10/30/19 1525    Visit Number  2    Number of Visits  12    Date for PT Re-Evaluation  12/06/19    Authorization Type  [employer]    PT Start Time  1519    PT Stop Time  1600    PT Time Calculation (min)  41 min    Activity Tolerance  Patient tolerated treatment well    Behavior During Therapy  [facility] for tasks assessed/performed       Past Medical History:  Diagnosis Date  . Acute blood loss anemia   . Arthritis   . Back pain   . Bilious vomiting with nausea   . Constipation   . GERD (gastroesophageal reflux disease)   . HLD (hyperlipidemia)   . Hypertension   . Hypothyroid   . Joint pain   . Leg edema   . Leukocytosis   . Multiple food allergies   . Myalgia due to statin 11/23/2018  . Osteoarthritis of hip    Right  . Postmenopausal hormone replacement therapy 06/17/2015  . Prediabetes   . Sessile colonic polyp 10/04/2017   Colonoscopy 12/2015, Dr. Fuller Plan, repeat q 5 years.    Past Surgical History:  Procedure Laterality Date  . EYE SURGERY  1987  . HERNIA REPAIR    . THYROID LOBECTOMY     right side  . TOTAL HIP ARTHROPLASTY Right 01/28/2016   Procedure: RIGHT TOTAL HIP ARTHROPLASTY ANTERIOR APPROACH;  Surgeon: [name], MD;  Location: [facility];  Service: Orthopedics;  Laterality: Right;    There were no vitals filed for this visit.  Subjective Assessment - 10/30/19 1525    Subjective  Pt states soreness today, when putting dishes away. Has been doing HEP.    Currently in Pain?  Yes    Pain Score  5     Pain Location  Back    Pain Orientation  Right;Left    Pain Descriptors / Indicators  Aching    Pain Type  Chronic pain     Pain Onset  More than a month ago    Pain Frequency  Intermittent                       [facility] Adult PT Treatment/Exercise - 10/30/19 1524      Exercises   Exercises  Lumbar      Lumbar Exercises: Stretches   Active Hamstring Stretch  2 reps;30 seconds    Active Hamstring Stretch Limitations  seated    Single Knee to Chest Stretch  2 reps;30 seconds    Pelvic Tilt  20 reps    Figure 4 Stretch  2 reps;30 seconds    Figure 4 Stretch Limitations  supine, modified     Other Lumbar Stretch Exercise  Standing QL stretch 30 sec x2; bil       Lumbar Exercises: Aerobic   Recumbent Bike  L1 x 5 min;       Lumbar Exercises: Standing   Other Standing Lumbar Exercises  March x20; Hip abd 2x10 bil;        Lumbar Exercises: Supine   Pelvic Tilt  20 reps  Bent Knee Raise  20 reps    Bridge  20 reps      Manual Therapy   Manual Therapy  Joint mobilization;Soft tissue mobilization;Manual Traction    Joint Mobilization  Lumbar PA mobs gr 3;      Manual Traction  L LE long leg distraction x2 min for back                PT Short Term Goals - 10/30/19 1914      PT SHORT TERM GOAL #1   Title  Pt to be independent with initial HEP    Time  2    Period  Weeks    Status  New    Target Date  11/08/19      PT SHORT TERM GOAL #2   Title  Pt to demo ability to self correct seated posture in clinic at least 75 % of the time.    Time  2    Period  Weeks    Status  New    Target Date  11/08/19        PT Long Term Goals - 10/30/19 0822      PT LONG TERM GOAL #1   Title  Pt to be independent with final HEP    Time  6    Period  Weeks    Status  New    Target Date  12/06/19      PT LONG TERM GOAL #2   Title  Pt to report decreased pain in low back to 0-3/10 with activity    Time  6    Period  Weeks    Status  New    Target Date  12/06/19      PT LONG TERM GOAL #3   Title  Pt to demo proper mechanics for bend/lift, to improve ability and pain with IADLs     Time  6    Period  Weeks    Status  New    Target Date  12/06/19      PT LONG TERM GOAL #4   Title  Pt to report ability for ambulation up to 1 mile, without pain greater than 3/10 in back, to improve ability for exercise and functional activity    Time  6    Period  Weeks    Status  New    Target Date  12/06/19            Plan - 10/30/19 1619    Clinical Impression Statement  Ther ex progressed for lumbar pain and core strengthening. Pt with minimal soreness during activities today. Did have increased cramping in LEs with some ther ex. Plan to progress as tolerated.    Personal Factors and Comorbidities  Fitness;Time since onset of injury/illness/exacerbation    Examination-Activity Limitations  Bathing;Locomotion Level;Bend;Sit;Squat;Stand;Lift    Examination-Participation Restrictions  Meal Prep;Cleaning;Community Activity;Shop;Laundry    Stability/Clinical Decision Making  Stable/Uncomplicated    Rehab Potential  Good    PT Frequency  2x / week    PT Duration  6 weeks    PT Treatment/Interventions  ADLs/Self Care Home Management;Canalith Repostioning;Cryotherapy;Electrical Stimulation;Iontophoresis 4mg /ml Dexamethasone;Moist Heat;Traction;Ultrasound;DME Instruction;Gait training;Neuromuscular re-education;Balance training;Therapeutic exercise;Therapeutic activities;Functional mobility training;Stair training;Patient/family education;Manual techniques;Taping;Energy conservation;Dry needling;Passive range of motion;Spinal Manipulations;Joint Manipulations;Vasopneumatic Device    Consulted and Agree with Plan of Care  Patient       Patient will benefit from skilled therapeutic intervention in order to improve the following deficits and impairments:  Decreased range of motion,  Difficulty walking, Increased muscle spasms, Decreased knowledge of precautions, Decreased activity tolerance, Pain, Impaired flexibility, Improper body mechanics, Decreased balance, Decreased mobility,  Decreased strength  Visit Diagnosis: Chronic bilateral low back pain with right-sided sciatica     Problem List Patient Active Problem List   Diagnosis Date Noted  . Major depression, recurrent, chronic ([place]) 10/24/2019  . Leg edema 10/24/2019  . Medication intolerance 10/24/2019  . Benign microscopic hematuria 09/17/2019  . Myalgia due to statin 11/23/2018  . Umbilical hernia 43/15/4008  . Sessile colonic polyp 10/04/2017  . Tobacco dependence 12/09/2015  . Hematuria 07/28/2015  . Status post right hip replacement 07/21/2015  . ACE inhibitor intolerance 06/17/2015  . DJD (degenerative joint disease), lumbar 06/17/2015  . Essential hypertension 06/17/2015  . Gastroesophageal reflux disease without esophagitis 06/17/2015  . Mixed hyperlipidemia 06/17/2015  . Morbid obesity ([place]) 06/17/2015  . Post-menopause on HRT (hormone replacement therapy) 06/17/2015  . 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 Primary osteoarthritis involving multiple joints 06/17/2015    Lyndee Hensen, PT, DPT 4:22 PM  10/30/19    [facility] [place], [place], [address] Phone: [phone]   Fax:  [phone]  Name: Maria Marquez MRN: [id number] Date of Birth: November 22, 1945

## 2019-10-31 ENCOUNTER — Ambulatory Visit (INDEPENDENT_AMBULATORY_CARE_PROVIDER_SITE_OTHER): Payer: Medicare HMO | Admitting: Family Medicine

## 2019-10-31 ENCOUNTER — Encounter (INDEPENDENT_AMBULATORY_CARE_PROVIDER_SITE_OTHER): Payer: Self-pay | Admitting: Family Medicine

## 2019-10-31 ENCOUNTER — Encounter: Payer: Self-pay | Admitting: Physical Therapy

## 2019-10-31 VITALS — BP 122/73 | HR 82 | Temp 98.1°F | Ht 66.0 in | Wt 241.0 lb

## 2019-10-31 DIAGNOSIS — E559 Vitamin D deficiency, unspecified: Secondary | ICD-10-CM | POA: Diagnosis not present

## 2019-10-31 DIAGNOSIS — Z6839 Body mass index (BMI) 39.0-39.9, adult: Secondary | ICD-10-CM | POA: Diagnosis not present

## 2019-10-31 MED ORDER — VITAMIN D (ERGOCALCIFEROL) 1.25 MG (50000 UNIT) PO CAPS: 50000.0000 [IU] | ORAL_CAPSULE | ORAL | 0 refills | Status: DC

## 2019-10-31 NOTE — Progress Notes (Signed)
Chief Complaint:   OBESITY Maria Marquez is here to discuss her progress with her obesity treatment plan along with follow-up of her obesity related diagnoses. Maria Marquez is on practicing portion control and making smarter food choices, such as increasing vegetables and decreasing simple carbohydrates and states she is following her eating plan approximately 30-40% of the time. Maria Marquez states she is doing physical therapy for 45 minutes 2 times per week.  Today's visit was #: 82 Starting weight: 244 lbs Starting date: 02/22/2018 Today's weight: 241 lbs Today's date: 10/31/2019 Total lbs lost to date: 3 Total lbs lost since last in-office visit: 3  Interim History: Maria Marquez has been doing better with weight loss efforts. Her mood has improved with improved weather, and she feels she has decreased emotional eating.  Subjective:   1. Vitamin D deficiency Maria Marquez is stable on Vit D, and she has questions about how much sunlight is required.  Assessment/Plan:   1. Vitamin D deficiency Low Vitamin D level contributes to fatigue and are associated with obesity, breast, and colon cancer. We will refill prescription Vitamin D for 1 month. Maria Marquez will follow-up for routine testing of Vitamin D, at least 2-3 times per year to avoid over-replacement. She is to try to get 5 minutes of sun exposure on her face and hands daily.  - Vitamin D, Ergocalciferol, (DRISDOL) 1.25 MG (50000 UNIT) CAPS capsule; Take 1 capsule (50,000 Units total) by mouth every 7 (seven) days.  Dispense: 4 capsule; Refill: 0  2. Class 2 severe obesity with serious comorbidity and body mass index (BMI) of 39.0 to 39.9 in adult, unspecified obesity type (HCC) Maria Marquez is currently in the action stage of change. As such, her goal is to continue with weight loss efforts. She has agreed to the Category 2 Plan.   Exercise goals: As is.  Behavioral modification strategies: emotional eating strategies.  Maria Marquez has agreed to follow-up with our clinic  in 3 weeks. She was informed of the importance of frequent follow-up visits to maximize her success with intensive lifestyle modifications for her multiple health conditions.   Objective:   Blood pressure 122/73, pulse 82, temperature 98.1 F (36.7 C), temperature source Oral, height 5\' 6"  (1.676 m), weight 241 lb (109.3 kg), SpO2 96 %. Body mass index is 38.9 kg/m.  General: Cooperative, alert, well developed, in no acute distress. HEENT: Conjunctivae and lids unremarkable. Cardiovascular: Regular rhythm.  Lungs: Normal work of breathing. Neurologic: No focal deficits.   Lab Results  Component Value Date   CREATININE 1.34 (H) 09/17/2019   BUN 24 (H) 09/17/2019   NA 137 09/17/2019   K 4.1 09/17/2019   CL 103 09/17/2019   CO2 29 09/17/2019   Lab Results  Component Value Date   ALT 19 06/14/2019   AST 14 06/14/2019   ALKPHOS 72 06/14/2019   BILITOT 0.8 06/14/2019   Lab Results  Component Value Date   HGBA1C 5.7 (H) 11/01/2018   HGBA1C 5.8 (H) 06/13/2018   HGBA1C 6.4 01/17/2018   HGBA1C 6.1 09/23/2015   Lab Results  Component Value Date   INSULIN 11.7 11/01/2018   INSULIN 15.0 06/13/2018   INSULIN 14.0 02/22/2018   Lab Results  Component Value Date   TSH 2.81 06/14/2019   Lab Results  Component Value Date   CHOL 199 06/14/2019   HDL 40.70 06/14/2019   LDLCALC 129 (H) 06/14/2019   LDLDIRECT 197.0 01/13/2018   TRIG 147.0 06/14/2019   CHOLHDL 5 06/14/2019   Lab Results  Component Value Date   WBC 8.3 06/14/2019   HGB 14.3 06/14/2019   HCT 42.3 06/14/2019   MCV 95.0 06/14/2019   PLT 247.0 06/14/2019   No results found for: IRON, TIBC, FERRITIN  Obesity Behavioral Intervention Documentation for Insurance:   Approximately 15 minutes were spent on the discussion below.  ASK: We discussed the diagnosis of obesity with Maria Marquez today and Maria Marquez agreed to give [name] permission to discuss obesity behavioral modification therapy today.  ASSESS: Maria Marquez has the  diagnosis of obesity and her BMI today is 38.92. [name] is in the action stage of change.   ADVISE: Maria Marquez was educated on the multiple health risks of obesity as well as the benefit of weight loss to improve her health. She was advised of the need for long term treatment and the importance of lifestyle modifications to improve her current health and to decrease her risk of future health problems.  AGREE: Multiple dietary modification options and treatment options were discussed and Maria Marquez agreed to follow the recommendations documented in the above note.  ARRANGE: Maria Marquez was educated on the importance of frequent visits to treat obesity as outlined per CMS and USPSTF guidelines and agreed to schedule her next follow up appointment today.  Attestation Statements:   Reviewed by clinician on day of visit: allergies, medications, problem list, medical history, surgical history, family history, social history, and previous encounter notes.   I, Trixie Dredge, am acting as transcriptionist for [name], MD.  I have reviewed the above documentation for accuracy and completeness, and I agree with the above. -  [name], MD

## 2019-11-01 ENCOUNTER — Ambulatory Visit (INDEPENDENT_AMBULATORY_CARE_PROVIDER_SITE_OTHER): Payer: Medicare HMO | Admitting: Physical Therapy

## 2019-11-01 ENCOUNTER — Encounter: Payer: Self-pay | Admitting: Physical Therapy

## 2019-11-01 ENCOUNTER — Other Ambulatory Visit: Payer: Self-pay

## 2019-11-01 DIAGNOSIS — G8929 Other chronic pain: Secondary | ICD-10-CM

## 2019-11-01 DIAGNOSIS — M5441 Lumbago with sciatica, right side: Secondary | ICD-10-CM | POA: Diagnosis not present

## 2019-11-01 NOTE — Therapy (Signed)
[facility] [address] [place], [place], [address] Phone: [phone]   Fax:  [phone]  Physical Therapy Treatment  Patient Details  Name: Maria Marquez MRN: [id number] Date of Birth: 11/25/1945 Referring Provider (PT): Billey Chang   Encounter Date: 11/01/2019  PT End of Session - 11/01/19 1020    Visit Number  3    Number of Visits  12    Date for PT Re-Evaluation  12/06/19    Authorization Type  [employer]    PT Start Time  0926    PT Stop Time  1016    PT Time Calculation (min)  50 min    Activity Tolerance  Patient tolerated treatment well    Behavior During Therapy  [facility] for tasks assessed/performed       Past Medical History:  Diagnosis Date  . Acute blood loss anemia   . Arthritis   . Back pain   . Bilious vomiting with nausea   . Constipation   . GERD (gastroesophageal reflux disease)   . HLD (hyperlipidemia)   . Hypertension   . Hypothyroid   . Joint pain   . Leg edema   . Leukocytosis   . Multiple food allergies   . Myalgia due to statin 11/23/2018  . Osteoarthritis of hip    Right  . Postmenopausal hormone replacement therapy 06/17/2015  . Prediabetes   . Sessile colonic polyp 10/04/2017   Colonoscopy 12/2015, Dr. Fuller Plan, repeat q 5 years.    Past Surgical History:  Procedure Laterality Date  . EYE SURGERY  1987  . HERNIA REPAIR    . THYROID LOBECTOMY     right side  . TOTAL HIP ARTHROPLASTY Right 01/28/2016   Procedure: RIGHT TOTAL HIP ARTHROPLASTY ANTERIOR APPROACH;  Surgeon: [name], MD;  Location: [facility];  Service: Orthopedics;  Laterality: Right;    There were no vitals filed for this visit.  Subjective Assessment - 11/01/19 1020    Subjective  Pt states mild soreness in hips. Back not too sore yet today    Currently in Pain?  Yes    Pain Score  5     Pain Location  Back    Pain Orientation  Right;Left    Pain Descriptors / Indicators  Aching    Pain Type  Chronic pain    Pain  Onset  More than a month ago    Pain Frequency  Intermittent                       [facility] Adult PT Treatment/Exercise - 11/01/19 0935      Exercises   Exercises  Lumbar      Lumbar Exercises: Stretches   Active Hamstring Stretch  --    Active Hamstring Stretch Limitations  --    Single Knee to Chest Stretch  2 reps;30 seconds    Lower Trunk Rotation  5 reps;10 seconds    Pelvic Tilt  20 reps    Figure 4 Stretch  2 reps;30 seconds    Figure 4 Stretch Limitations  supine, modified     Other Lumbar Stretch Exercise  Standing QL stretch 30 sec x2; bil     Other Lumbar Stretch Exercise  seated fwd flexion 30 sec x2 ;       Lumbar Exercises: Aerobic   Recumbent Bike  L1 x 8 min;       Lumbar Exercises: Standing   Other Standing Lumbar Exercises  March  x20; Hip abd 2x10 bil;        Lumbar Exercises: Supine   Pelvic Tilt  20 reps    Bent Knee Raise  20 reps    Bridge  20 reps      Lumbar Exercises: Sidelying   Hip Abduction  20 reps      Manual Therapy   Manual Therapy  Joint mobilization;Soft tissue mobilization;Manual Traction    Joint Mobilization  Lumbar PA mobs gr 3;      Manual Traction  L LE long leg distraction x2 min for back                PT Short Term Goals - 10/30/19 3810      PT SHORT TERM GOAL #1   Title  Pt to be independent with initial HEP    Time  2    Period  Weeks    Status  New    Target Date  11/08/19      PT SHORT TERM GOAL #2   Title  Pt to demo ability to self correct seated posture in clinic at least 75 % of the time.    Time  2    Period  Weeks    Status  New    Target Date  11/08/19        PT Long Term Goals - 10/30/19 0822      PT LONG TERM GOAL #1   Title  Pt to be independent with final HEP    Time  6    Period  Weeks    Status  New    Target Date  12/06/19      PT LONG TERM GOAL #2   Title  Pt to report decreased pain in low back to 0-3/10 with activity    Time  6    Period  Weeks    Status  New     Target Date  12/06/19      PT LONG TERM GOAL #3   Title  Pt to demo proper mechanics for bend/lift, to improve ability and pain with IADLs    Time  6    Period  Weeks    Status  New    Target Date  12/06/19      PT LONG TERM GOAL #4   Title  Pt to report ability for ambulation up to 1 mile, without pain greater than 3/10 in back, to improve ability for exercise and functional activity    Time  6    Period  Weeks    Status  New    Target Date  12/06/19            Plan - 11/01/19 1021    Clinical Impression Statement  Pt with improved ability for ther ex today, less cramping. Ther ex progressed for core and hip strength. Minimal pain with activities today. Hypombile thoracic and lumbar spine with PAs. Pt continues to have difficulty standing and walking, discussed starting to walk short distances for exercise, and stretch before and after.    Personal Factors and Comorbidities  Fitness;Time since onset of injury/illness/exacerbation    Examination-Activity Limitations  Bathing;Locomotion Level;Bend;Sit;Squat;Stand;Lift    Examination-Participation Restrictions  Meal Prep;Cleaning;Community Activity;Shop;Laundry    Stability/Clinical Decision Making  Stable/Uncomplicated    Rehab Potential  Good    PT Frequency  2x / week    PT Duration  6 weeks    PT Treatment/Interventions  ADLs/Self Care Home Management;Canalith Repostioning;Cryotherapy;Electrical Stimulation;Iontophoresis 4mg /ml  Dexamethasone;Moist Heat;Traction;Ultrasound;DME Instruction;Gait training;Neuromuscular re-education;Balance training;Therapeutic exercise;Therapeutic activities;Functional mobility training;Stair training;Patient/family education;Manual techniques;Taping;Energy conservation;Dry needling;Passive range of motion;Spinal Manipulations;Joint Manipulations;Vasopneumatic Device    Consulted and Agree with Plan of Care  Patient       Patient will benefit from skilled therapeutic intervention in order to  improve the following deficits and impairments:  Decreased range of motion, Difficulty walking, Increased muscle spasms, Decreased knowledge of precautions, Decreased activity tolerance, Pain, Impaired flexibility, Improper body mechanics, Decreased balance, Decreased mobility, Decreased strength  Visit Diagnosis: Chronic bilateral low back pain with right-sided sciatica     Problem List Patient Active Problem List   Diagnosis Date Noted  . Major depression, recurrent, chronic ([place]) 10/24/2019  . Leg edema 10/24/2019  . Medication intolerance 10/24/2019  . Benign microscopic hematuria 09/17/2019  . Myalgia due to statin 11/23/2018  . Umbilical hernia 61/95/0932  . Sessile colonic polyp 10/04/2017  . Tobacco dependence 12/09/2015  . Hematuria 07/28/2015  . Status post right hip replacement 07/21/2015  . ACE inhibitor intolerance 06/17/2015  . DJD (degenerative joint disease), lumbar 06/17/2015  . Essential hypertension 06/17/2015  . Gastroesophageal reflux disease without esophagitis 06/17/2015  . Mixed hyperlipidemia 06/17/2015  . Morbid obesity ([place]) 06/17/2015  . Post-menopause on HRT (hormone replacement therapy) 06/17/2015  . 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 Primary osteoarthritis involving multiple joints 06/17/2015    Lyndee Hensen, PT, DPT 10:23 AM  11/01/19    [facility] [place] [place], [place], [address] Phone: [phone]   Fax:  [phone]  Name: Maria Marquez MRN: [id number] Date of Birth: August 08, 1946

## 2019-11-05 ENCOUNTER — Other Ambulatory Visit: Payer: Self-pay

## 2019-11-05 ENCOUNTER — Encounter: Payer: Self-pay | Admitting: Physical Therapy

## 2019-11-05 ENCOUNTER — Ambulatory Visit (INDEPENDENT_AMBULATORY_CARE_PROVIDER_SITE_OTHER): Payer: Medicare HMO | Admitting: Physical Therapy

## 2019-11-05 DIAGNOSIS — M5441 Lumbago with sciatica, right side: Secondary | ICD-10-CM | POA: Diagnosis not present

## 2019-11-05 DIAGNOSIS — G8929 Other chronic pain: Secondary | ICD-10-CM | POA: Diagnosis not present

## 2019-11-06 NOTE — Therapy (Signed)
[place] [address] [place], [place], [address] Phone: [phone]   Fax:  [phone]  Physical Therapy Treatment  Patient Details  Name: Maria Marquez MRN: [id number] Date of Birth: [birth year] Referring Provider (PT): Billey Chang   Encounter Date: 11/05/2019  PT End of Session - 11/05/19 1310    Visit Number  4    Number of Visits  12    Date for PT Re-Evaluation  12/06/19    Authorization Type  [employer]    PT Start Time  1303    PT Stop Time  1345    PT Time Calculation (min)  42 min    Activity Tolerance  Patient tolerated treatment well    Behavior During Therapy  [facility] for tasks assessed/performed       Past Medical History:  Diagnosis Date  . Acute blood loss anemia   . Arthritis   . Back pain   . Bilious vomiting with nausea   . Constipation   . GERD (gastroesophageal reflux disease)   . HLD (hyperlipidemia)   . Hypertension   . Hypothyroid   . Joint pain   . Leg edema   . Leukocytosis   . Multiple food allergies   . Myalgia due to statin 11/23/2018  . Osteoarthritis of hip    Right  . Postmenopausal hormone replacement therapy 06/17/2015  . Prediabetes   . Sessile colonic polyp 10/04/2017   Colonoscopy 12/2015, Dr. Fuller Plan, repeat q 5 years.    Past Surgical History:  Procedure Laterality Date  . EYE SURGERY  1987  . HERNIA REPAIR    . THYROID LOBECTOMY     right side  . TOTAL HIP ARTHROPLASTY Right 01/28/2016   Procedure: RIGHT TOTAL HIP ARTHROPLASTY ANTERIOR APPROACH;  Surgeon: [name], MD;  Location: [facility];  Service: Orthopedics;  Laterality: Right;    There were no vitals filed for this visit.  Subjective Assessment - 11/05/19 1309    Subjective  Pt states increased soreness in back today. States leaning fwd stretching feels good and helps to relieve.    Currently in Pain?  Yes    Pain Score  6     Pain Location  Back    Pain Orientation  Right;Left    Pain Descriptors / Indicators   Aching    Pain Type  Chronic pain    Pain Onset  More than a month ago    Pain Frequency  Intermittent                       [facility] Adult PT Treatment/Exercise - 11/05/19 1311      Exercises   Exercises  Lumbar      Lumbar Exercises: Stretches   Single Knee to Chest Stretch  2 reps;30 seconds    Lower Trunk Rotation  5 reps;10 seconds    Pelvic Tilt  20 reps    Figure 4 Stretch  2 reps;30 seconds    Figure 4 Stretch Limitations  seated    Other Lumbar Stretch Exercise  Standing QL stretch 30 sec x2; bil     Other Lumbar Stretch Exercise  seated fwd flexion 30 sec x2 ;       Lumbar Exercises: Aerobic   Recumbent Bike  L1 x 7 min;       Lumbar Exercises: Standing   Other Standing Lumbar Exercises  March x20; Hip abd 2x10 bil;        Lumbar Exercises:  Supine   Pelvic Tilt  20 reps    Clam  20 reps    Clam Limitations  GTB    Bent Knee Raise  20 reps    Bridge  --      Lumbar Exercises: Sidelying   Hip Abduction  --      Manual Therapy   Manual Therapy  Joint mobilization;Soft tissue mobilization;Manual Traction    Joint Mobilization  Lumbar PA mobs gr 3;      Soft tissue mobilization  STM bil lumbar region     Manual Traction  L LE long leg distraction x2 min for back                PT Short Term Goals - 10/30/19 8563      PT SHORT TERM GOAL #1   Title  Pt to be independent with initial HEP    Time  2    Period  Weeks    Status  New    Target Date  11/08/19      PT SHORT TERM GOAL #2   Title  Pt to demo ability to self correct seated posture in clinic at least 75 % of the time.    Time  2    Period  Weeks    Status  New    Target Date  11/08/19        PT Long Term Goals - 10/30/19 0822      PT LONG TERM GOAL #1   Title  Pt to be independent with final HEP    Time  6    Period  Weeks    Status  New    Target Date  12/06/19      PT LONG TERM GOAL #2   Title  Pt to report decreased pain in low back to 0-3/10 with activity     Time  6    Period  Weeks    Status  New    Target Date  12/06/19      PT LONG TERM GOAL #3   Title  Pt to demo proper mechanics for bend/lift, to improve ability and pain with IADLs    Time  6    Period  Weeks    Status  New    Target Date  12/06/19      PT LONG TERM GOAL #4   Title  Pt to report ability for ambulation up to 1 mile, without pain greater than 3/10 in back, to improve ability for exercise and functional activity    Time  6    Period  Weeks    Status  New    Target Date  12/06/19            Plan - 11/06/19 1135    Clinical Impression Statement  Pt with increased soreness today, ther ex does not increase pain, pt states some relief after session. Discussed continuing HEP for flexion, pain relief. Pt to benefit from continued care.    Personal Factors and Comorbidities  Fitness;Time since onset of injury/illness/exacerbation    Examination-Activity Limitations  Bathing;Locomotion Level;Bend;Sit;Squat;Stand;Lift    Examination-Participation Restrictions  Meal Prep;Cleaning;Community Activity;Shop;Laundry    Stability/Clinical Decision Making  Stable/Uncomplicated    Rehab Potential  Good    PT Frequency  2x / week    PT Duration  6 weeks    PT Treatment/Interventions  ADLs/Self Care Home Management;Canalith Repostioning;Cryotherapy;Electrical Stimulation;Iontophoresis 4mg /ml Dexamethasone;Moist Heat;Traction;Ultrasound;DME Instruction;Gait training;Neuromuscular re-education;Balance training;Therapeutic exercise;Therapeutic activities;Functional mobility training;Stair training;Patient/family  education;Manual techniques;Taping;Energy conservation;Dry needling;Passive range of motion;Spinal Manipulations;Joint Manipulations;Vasopneumatic Device    Consulted and Agree with Plan of Care  Patient       Patient will benefit from skilled therapeutic intervention in order to improve the following deficits and impairments:  Decreased range of motion, Difficulty walking,  Increased muscle spasms, Decreased knowledge of precautions, Decreased activity tolerance, Pain, Impaired flexibility, Improper body mechanics, Decreased balance, Decreased mobility, Decreased strength  Visit Diagnosis: Chronic bilateral low back pain with right-sided sciatica     Problem List Patient Active Problem List   Diagnosis Date Noted  . Major depression, recurrent, chronic ([place]) 10/24/2019  . Leg edema 10/24/2019  . Medication intolerance 10/24/2019  . Benign microscopic hematuria 09/17/2019  . Myalgia due to statin 11/23/2018  . Umbilical hernia 41/74/0814  . Sessile colonic polyp 10/04/2017  . Tobacco dependence 12/09/2015  . Hematuria 07/28/2015  . Status post right hip replacement 07/21/2015  . ACE inhibitor intolerance 06/17/2015  . DJD (degenerative joint disease), lumbar 06/17/2015  . Essential hypertension 06/17/2015  . Gastroesophageal reflux disease without esophagitis 06/17/2015  . Mixed hyperlipidemia 06/17/2015  . Morbid obesity ([place]) 06/17/2015  . Post-menopause on HRT (hormone replacement therapy) 06/17/2015  . 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 Primary osteoarthritis involving multiple joints 06/17/2015    Lyndee Hensen, PT, DPT 11:40 AM  11/06/19    [facility] [place], [place], [address] Phone: [phone]   Fax:  [phone]  Name: Alton Tremblay MRN: [id number] Date of Birth: 1946/05/15

## 2019-11-07 ENCOUNTER — Encounter: Payer: Self-pay | Admitting: Physical Therapy

## 2019-11-07 ENCOUNTER — Other Ambulatory Visit: Payer: Self-pay

## 2019-11-07 ENCOUNTER — Ambulatory Visit (INDEPENDENT_AMBULATORY_CARE_PROVIDER_SITE_OTHER): Payer: Medicare HMO | Admitting: Physical Therapy

## 2019-11-07 DIAGNOSIS — M5441 Lumbago with sciatica, right side: Secondary | ICD-10-CM | POA: Diagnosis not present

## 2019-11-07 DIAGNOSIS — G8929 Other chronic pain: Secondary | ICD-10-CM | POA: Diagnosis not present

## 2019-11-09 ENCOUNTER — Encounter: Payer: Self-pay | Admitting: Physical Therapy

## 2019-11-09 NOTE — Therapy (Signed)
[place] [address] [place], [place], [address] Phone: [phone]   Fax:  [phone]  Physical Therapy Treatment  Patient Details  Name: Maria Marquez MRN: [id number] Date of Birth: 1946-01-24 Referring Provider (PT): Billey Chang   Encounter Date: 11/07/2019  PT End of Session - 11/09/19 1422    Visit Number  5    Number of Visits  12    Date for PT Re-Evaluation  12/06/19    Authorization Type  [employer]    PT Start Time  1302    PT Stop Time  1344    PT Time Calculation (min)  42 min    Activity Tolerance  Patient tolerated treatment well    Behavior During Therapy  [facility] for tasks assessed/performed       Past Medical History:  Diagnosis Date  . Acute blood loss anemia   . Arthritis   . Back pain   . Bilious vomiting with nausea   . Constipation   . GERD (gastroesophageal reflux disease)   . HLD (hyperlipidemia)   . Hypertension   . Hypothyroid   . Joint pain   . Leg edema   . Leukocytosis   . Multiple food allergies   . Myalgia due to statin 11/23/2018  . Osteoarthritis of hip    Right  . Postmenopausal hormone replacement therapy 06/17/2015  . Prediabetes   . Sessile colonic polyp 10/04/2017   Colonoscopy 12/2015, Dr. Fuller Plan, repeat q 5 years.    Past Surgical History:  Procedure Laterality Date  . EYE SURGERY  1987  . HERNIA REPAIR    . THYROID LOBECTOMY     right side  . TOTAL HIP ARTHROPLASTY Right 01/28/2016   Procedure: RIGHT TOTAL HIP ARTHROPLASTY ANTERIOR APPROACH;  Surgeon: [name], MD;  Location: [facility];  Service: Orthopedics;  Laterality: Right;    There were no vitals filed for this visit.  Subjective Assessment - 11/09/19 1421    Subjective  Pt with continued pain, mild increase in pain this week from last week. Sore with standing and activity    Patient Stated Goals  Decreased pain    Currently in Pain?  Yes    Pain Score  6     Pain Location  Back    Pain Orientation   Right;Left    Pain Descriptors / Indicators  Aching    Pain Type  Chronic pain    Pain Onset  More than a month ago    Pain Frequency  Intermittent                       [facility] Adult PT Treatment/Exercise - 11/09/19 0001      Exercises   Exercises  Lumbar      Lumbar Exercises: Stretches   Single Knee to Chest Stretch  2 reps;30 seconds    Lower Trunk Rotation  5 reps;10 seconds    Pelvic Tilt  20 reps    Figure 4 Stretch  2 reps;30 seconds    Figure 4 Stretch Limitations  seated    Other Lumbar Stretch Exercise  Standing QL stretch 30 sec x2; bil       Lumbar Exercises: Aerobic   Recumbent Bike  L1 x 8 min;       Lumbar Exercises: Standing   Other Standing Lumbar Exercises  March x20; Hip abd 2x10 bil;        Lumbar Exercises: Supine   Pelvic Tilt  20 reps    Clam  20 reps    Clam Limitations  GTB    Bent Knee Raise  20 reps    Other Supine Lumbar Exercises  Modified crunch x20       Manual Therapy   Manual Therapy  Joint mobilization;Soft tissue mobilization;Manual Traction    Joint Mobilization  Lumbar mobs, S/L decompression    Soft tissue mobilization  STM bil lumbar region                PT Short Term Goals - 10/30/19 0821      PT SHORT TERM GOAL #1   Title  Pt to be independent with initial HEP    Time  2    Period  Weeks    Status  New    Target Date  11/08/19      PT SHORT TERM GOAL #2   Title  Pt to demo ability to self correct seated posture in clinic at least 75 % of the time.    Time  2    Period  Weeks    Status  New    Target Date  11/08/19        PT Long Term Goals - 10/30/19 0822      PT LONG TERM GOAL #1   Title  Pt to be independent with final HEP    Time  6    Period  Weeks    Status  New    Target Date  12/06/19      PT LONG TERM GOAL #2   Title  Pt to report decreased pain in low back to 0-3/10 with activity    Time  6    Period  Weeks    Status  New    Target Date  12/06/19      PT LONG TERM GOAL  #3   Title  Pt to demo proper mechanics for bend/lift, to improve ability and pain with IADLs    Time  6    Period  Weeks    Status  New    Target Date  12/06/19      PT LONG TERM GOAL #4   Title  Pt to report ability for ambulation up to 1 mile, without pain greater than 3/10 in back, to improve ability for exercise and functional activity    Time  6    Period  Weeks    Status  New    Target Date  12/06/19            Plan - 11/09/19 1424    Clinical Impression Statement  Pt with decreased soreness after session today. No increased pain with activity, but has had increased pain this week overall. Plan to progress as tolerated with focus on pain relief.    Personal Factors and Comorbidities  Fitness;Time since onset of injury/illness/exacerbation    Examination-Activity Limitations  Bathing;Locomotion Level;Bend;Sit;Squat;Stand;Lift    Examination-Participation Restrictions  Meal Prep;Cleaning;Community Activity;Shop;Laundry    Stability/Clinical Decision Making  Stable/Uncomplicated    Rehab Potential  Good    PT Frequency  2x / week    PT Duration  6 weeks    PT Treatment/Interventions  ADLs/Self Care Home Management;Canalith Repostioning;Cryotherapy;Electrical Stimulation;Iontophoresis 4mg /ml Dexamethasone;Moist Heat;Traction;Ultrasound;DME Instruction;Gait training;Neuromuscular re-education;Balance training;Therapeutic exercise;Therapeutic activities;Functional mobility training;Stair training;Patient/family education;Manual techniques;Taping;Energy conservation;Dry needling;Passive range of motion;Spinal Manipulations;Joint Manipulations;Vasopneumatic Device    Consulted and Agree with Plan of Care  Patient       Patient will benefit from skilled  therapeutic intervention in order to improve the following deficits and impairments:  Decreased range of motion, Difficulty walking, Increased muscle spasms, Decreased knowledge of precautions, Decreased activity tolerance, Pain,  Impaired flexibility, Improper body mechanics, Decreased balance, Decreased mobility, Decreased strength  Visit Diagnosis: Chronic bilateral low back pain with right-sided sciatica     Problem List Patient Active Problem List   Diagnosis Date Noted  . Major depression, recurrent, chronic ([place]) 10/24/2019  . Leg edema 10/24/2019  . Medication intolerance 10/24/2019  . Benign microscopic hematuria 09/17/2019  . Myalgia due to statin 11/23/2018  . Umbilical hernia 21/06/7355  . Sessile colonic polyp 10/04/2017  . Tobacco dependence 12/09/2015  . Hematuria 07/28/2015  . Status post right hip replacement 07/21/2015  . ACE inhibitor intolerance 06/17/2015  . DJD (degenerative joint disease), lumbar 06/17/2015  . Essential hypertension 06/17/2015  . Gastroesophageal reflux disease without esophagitis 06/17/2015  . Mixed hyperlipidemia 06/17/2015  . Morbid obesity ([place]) 06/17/2015  . Post-menopause on HRT (hormone replacement therapy) 06/17/2015  . 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 Primary osteoarthritis involving multiple joints 06/17/2015    Lyndee Hensen, PT, DPT 2:29 PM  11/09/19    [facility] [place], [place], [address] Phone: [phone]   Fax:  [phone]  Name: Tyeisha Dinan MRN: [id number] Date of Birth: 13-Jun-1946

## 2019-11-12 ENCOUNTER — Ambulatory Visit (INDEPENDENT_AMBULATORY_CARE_PROVIDER_SITE_OTHER): Payer: Medicare HMO | Admitting: Physical Therapy

## 2019-11-12 ENCOUNTER — Other Ambulatory Visit: Payer: Self-pay

## 2019-11-12 ENCOUNTER — Encounter: Payer: Self-pay | Admitting: Physical Therapy

## 2019-11-12 DIAGNOSIS — M5441 Lumbago with sciatica, right side: Secondary | ICD-10-CM | POA: Diagnosis not present

## 2019-11-12 DIAGNOSIS — G8929 Other chronic pain: Secondary | ICD-10-CM | POA: Diagnosis not present

## 2019-11-12 NOTE — Therapy (Signed)
[place] [address] [place], [place], [address] Phone: [phone]   Fax:  [phone]  Physical Therapy Treatment  Patient Details  Name: Maria Marquez MRN: [id number] Date of Birth: 05/11/1946 Referring Provider (PT): Billey Chang   Encounter Date: 11/12/2019  PT End of Session - 11/12/19 1338    Visit Number  6    Number of Visits  12    Date for PT Re-Evaluation  12/06/19    Authorization Type  [employer]    PT Start Time  6073    PT Stop Time  1346    PT Time Calculation (min)  40 min    Activity Tolerance  Patient tolerated treatment well    Behavior During Therapy  [facility] for tasks assessed/performed       Past Medical History:  Diagnosis Date  . Acute blood loss anemia   . Arthritis   . Back pain   . Bilious vomiting with nausea   . Constipation   . GERD (gastroesophageal reflux disease)   . HLD (hyperlipidemia)   . Hypertension   . Hypothyroid   . Joint pain   . Leg edema   . Leukocytosis   . Multiple food allergies   . Myalgia due to statin 11/23/2018  . Osteoarthritis of hip    Right  . Postmenopausal hormone replacement therapy 06/17/2015  . Prediabetes   . Sessile colonic polyp 10/04/2017   Colonoscopy 12/2015, Dr. Fuller Plan, repeat q 5 years.    Past Surgical History:  Procedure Laterality Date  . EYE SURGERY  1987  . HERNIA REPAIR    . THYROID LOBECTOMY     right side  . TOTAL HIP ARTHROPLASTY Right 01/28/2016   Procedure: RIGHT TOTAL HIP ARTHROPLASTY ANTERIOR APPROACH;  Surgeon: [name], MD;  Location: [facility];  Service: Orthopedics;  Laterality: Right;    There were no vitals filed for this visit.  Subjective Assessment - 11/12/19 1337    Subjective  Pt states mild decrease in pain from last week.    Currently in Pain?  Yes    Pain Score  4     Pain Location  Back    Pain Orientation  Right;Left    Pain Descriptors / Indicators  Aching    Pain Type  Chronic pain    Pain Onset  More  than a month ago    Pain Frequency  Intermittent                       [facility] Adult PT Treatment/Exercise - 11/12/19 1310      Exercises   Exercises  Lumbar      Lumbar Exercises: Stretches   Single Knee to Chest Stretch  2 reps;30 seconds    Lower Trunk Rotation  5 reps;10 seconds    Pelvic Tilt  20 reps    Figure 4 Stretch  2 reps;30 seconds    Figure 4 Stretch Limitations  supine    Other Lumbar Stretch Exercise  Standing QL stretch 30 sec x2; bil       Lumbar Exercises: Aerobic   Recumbent Bike  L2 x 8 min;       Lumbar Exercises: Standing   Row  20 reps    Theraband Level (Row)  Level 3 (Green)    Other Standing Lumbar Exercises  March x20; Hip abd 2x10 bil;        Lumbar Exercises: Supine   Pelvic Tilt  20  reps    Clam  --    Clam Limitations  --    Bent Knee Raise  20 reps    Straight Leg Raise  20 reps    Other Supine Lumbar Exercises  Modified crunch x20       Manual Therapy   Manual Therapy  Joint mobilization;Soft tissue mobilization;Manual Traction    Joint Mobilization  Lumbar mobs, S/L decompression    Soft tissue mobilization  STM bil lumbar region                PT Short Term Goals - 10/30/19 0821      PT SHORT TERM GOAL #1   Title  Pt to be independent with initial HEP    Time  2    Period  Weeks    Status  New    Target Date  11/08/19      PT SHORT TERM GOAL #2   Title  Pt to demo ability to self correct seated posture in clinic at least 75 % of the time.    Time  2    Period  Weeks    Status  New    Target Date  11/08/19        PT Long Term Goals - 10/30/19 0822      PT LONG TERM GOAL #1   Title  Pt to be independent with final HEP    Time  6    Period  Weeks    Status  New    Target Date  12/06/19      PT LONG TERM GOAL #2   Title  Pt to report decreased pain in low back to 0-3/10 with activity    Time  6    Period  Weeks    Status  New    Target Date  12/06/19      PT LONG TERM GOAL #3   Title  Pt  to demo proper mechanics for bend/lift, to improve ability and pain with IADLs    Time  6    Period  Weeks    Status  New    Target Date  12/06/19      PT LONG TERM GOAL #4   Title  Pt to report ability for ambulation up to 1 mile, without pain greater than 3/10 in back, to improve ability for exercise and functional activity    Time  6    Period  Weeks    Status  New    Target Date  12/06/19            Plan - 11/12/19 1427    Clinical Impression Statement  Pt with less pain today than last week. Discussed optimal standing posture, and trying to still limit amount of time standing for back pain. Ther ex continued for stretching and strengthening for flexion bias/stenosis. Pt to benefit from continued care.    Personal Factors and Comorbidities  Fitness;Time since onset of injury/illness/exacerbation    Examination-Activity Limitations  Bathing;Locomotion Level;Bend;Sit;Squat;Stand;Lift    Examination-Participation Restrictions  Meal Prep;Cleaning;Community Activity;Shop;Laundry    Stability/Clinical Decision Making  Stable/Uncomplicated    Rehab Potential  Good    PT Frequency  2x / week    PT Duration  6 weeks    PT Treatment/Interventions  ADLs/Self Care Home Management;Canalith Repostioning;Cryotherapy;Electrical Stimulation;Iontophoresis 4mg /ml Dexamethasone;Moist Heat;Traction;Ultrasound;DME Instruction;Gait training;Neuromuscular re-education;Balance training;Therapeutic exercise;Therapeutic activities;Functional mobility training;Stair training;Patient/family education;Manual techniques;Taping;Energy conservation;Dry needling;Passive range of motion;Spinal Manipulations;Joint Manipulations;Vasopneumatic Device    Consulted and Agree  with Plan of Care  Patient       Patient will benefit from skilled therapeutic intervention in order to improve the following deficits and impairments:  Decreased range of motion, Difficulty walking, Increased muscle spasms, Decreased knowledge of  precautions, Decreased activity tolerance, Pain, Impaired flexibility, Improper body mechanics, Decreased balance, Decreased mobility, Decreased strength  Visit Diagnosis: Chronic bilateral low back pain with right-sided sciatica     Problem List Patient Active Problem List   Diagnosis Date Noted  . Major depression, recurrent, chronic ([place]) 10/24/2019  . Leg edema 10/24/2019  . Medication intolerance 10/24/2019  . Benign microscopic hematuria 09/17/2019  . Myalgia due to statin 11/23/2018  . Umbilical hernia 28/00/3491  . Sessile colonic polyp 10/04/2017  . Tobacco dependence 12/09/2015  . Hematuria 07/28/2015  . Status post right hip replacement 07/21/2015  . ACE inhibitor intolerance 06/17/2015  . DJD (degenerative joint disease), lumbar 06/17/2015  . Essential hypertension 06/17/2015  . Gastroesophageal reflux disease without esophagitis 06/17/2015  . Mixed hyperlipidemia 06/17/2015  . Morbid obesity ([place]) 06/17/2015  . Post-menopause on HRT (hormone replacement therapy) 06/17/2015  . 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 Primary osteoarthritis involving multiple joints 06/17/2015    Lyndee Hensen, PT, DPT 2:28 PM  11/12/19    [place], [place], [address] Phone: [phone]   Fax:  [phone]  Name: Rashae Rother MRN: [id number] Date of Birth: 1945/10/29

## 2019-11-14 ENCOUNTER — Ambulatory Visit (INDEPENDENT_AMBULATORY_CARE_PROVIDER_SITE_OTHER): Payer: Medicare HMO | Admitting: Physical Therapy

## 2019-11-14 ENCOUNTER — Other Ambulatory Visit: Payer: Self-pay

## 2019-11-14 ENCOUNTER — Encounter: Payer: Self-pay | Admitting: Physical Therapy

## 2019-11-14 DIAGNOSIS — M5441 Lumbago with sciatica, right side: Secondary | ICD-10-CM | POA: Diagnosis not present

## 2019-11-14 DIAGNOSIS — G8929 Other chronic pain: Secondary | ICD-10-CM | POA: Diagnosis not present

## 2019-11-14 NOTE — Therapy (Signed)
[facility] [address] [place], [place], [address] Phone: [phone]   Fax:  [phone]  Physical Therapy Treatment  Patient Details  Name: Maria Marquez MRN: [id number] Date of Birth: 09/25/1945 Referring Provider (PT): Billey Chang   Encounter Date: 11/14/2019  PT End of Session - 11/14/19 1432    Visit Number  7    Number of Visits  12    Date for PT Re-Evaluation  12/06/19    Authorization Type  [employer]    PT Start Time  1302    PT Stop Time  1345    PT Time Calculation (min)  43 min    Activity Tolerance  Patient tolerated treatment well    Behavior During Therapy  [facility] for tasks assessed/performed       Past Medical History:  Diagnosis Date  . Acute blood loss anemia   . Arthritis   . Back pain   . Bilious vomiting with nausea   . Constipation   . GERD (gastroesophageal reflux disease)   . HLD (hyperlipidemia)   . Hypertension   . Hypothyroid   . Joint pain   . Leg edema   . Leukocytosis   . Multiple food allergies   . Myalgia due to statin 11/23/2018  . Osteoarthritis of hip    Right  . Postmenopausal hormone replacement therapy 06/17/2015  . Prediabetes   . Sessile colonic polyp 10/04/2017   Colonoscopy 12/2015, Dr. Fuller Plan, repeat q 5 years.    Past Surgical History:  Procedure Laterality Date  . EYE SURGERY  1987  . HERNIA REPAIR    . THYROID LOBECTOMY     right side  . TOTAL HIP ARTHROPLASTY Right 01/28/2016   Procedure: RIGHT TOTAL HIP ARTHROPLASTY ANTERIOR APPROACH;  Surgeon: [name], MD;  Location: [facility];  Service: Orthopedics;  Laterality: Right;    There were no vitals filed for this visit.  Subjective Assessment - 11/14/19 1309    Subjective  Pt states pain in low back today. Feels she is a lot better, states pain in back was an 8-9 , now 5/10 .    Currently in Pain?  Yes    Pain Score  5     Pain Location  Back    Pain Orientation  Right;Left    Pain Descriptors / Indicators   Aching    Pain Type  Chronic pain    Pain Onset  More than a month ago    Pain Frequency  Intermittent                       [facility] Adult PT Treatment/Exercise - 11/14/19 1309      Exercises   Exercises  Lumbar      Lumbar Exercises: Stretches   Single Knee to Chest Stretch  2 reps;30 seconds    Lower Trunk Rotation  5 reps;10 seconds    Pelvic Tilt  20 reps    Figure 4 Stretch  --    Figure 4 Stretch Limitations  --    Other Lumbar Stretch Exercise  Standing QL stretch 30 sec x2; bil       Lumbar Exercises: Aerobic   Recumbent Bike  L2 x 8 min;       Lumbar Exercises: Standing   Row  20 reps    Theraband Level (Row)  Level 3 (Green)    Other Standing Lumbar Exercises  March x20; Hip abd 2x10 bil;  Lumbar Exercises: Supine   Pelvic Tilt  20 reps    Bent Knee Raise  20 reps    Straight Leg Raise  --    Other Supine Lumbar Exercises  Modified crunch x20       Lumbar Exercises: Sidelying   Hip Abduction  Both;15 reps      Manual Therapy   Manual Therapy  Joint mobilization;Soft tissue mobilization;Manual Traction    Joint Mobilization  --    Soft tissue mobilization  STM bil lumbar region                PT Short Term Goals - 10/30/19 0821      PT SHORT TERM GOAL #1   Title  Pt to be independent with initial HEP    Time  2    Period  Weeks    Status  New    Target Date  11/08/19      PT SHORT TERM GOAL #2   Title  Pt to demo ability to self correct seated posture in clinic at least 75 % of the time.    Time  2    Period  Weeks    Status  New    Target Date  11/08/19        PT Long Term Goals - 10/30/19 0822      PT LONG TERM GOAL #1   Title  Pt to be independent with final HEP    Time  6    Period  Weeks    Status  New    Target Date  12/06/19      PT LONG TERM GOAL #2   Title  Pt to report decreased pain in low back to 0-3/10 with activity    Time  6    Period  Weeks    Status  New    Target Date  12/06/19      PT  LONG TERM GOAL #3   Title  Pt to demo proper mechanics for bend/lift, to improve ability and pain with IADLs    Time  6    Period  Weeks    Status  New    Target Date  12/06/19      PT LONG TERM GOAL #4   Title  Pt to report ability for ambulation up to 1 mile, without pain greater than 3/10 in back, to improve ability for exercise and functional activity    Time  6    Period  Weeks    Status  New    Target Date  12/06/19            Plan - 11/14/19 1433    Clinical Impression Statement  Pt with soreness with palpation of R>L side of back today. Pt with questions about possible leg length deficit. Assessed in supine and standing. Does not appear to be signfiicant deficit , no obvious hip height difference or postural asymmetry  in standing, discussed other possibilities, hip weakness, back tightness, etc that may be causing that feeling. Pt to benefit form continued care for core strength and pain relief.    Personal Factors and Comorbidities  Fitness;Time since onset of injury/illness/exacerbation    Examination-Activity Limitations  Bathing;Locomotion Level;Bend;Sit;Squat;Stand;Lift    Examination-Participation Restrictions  Meal Prep;Cleaning;Community Activity;Shop;Laundry    Stability/Clinical Decision Making  Stable/Uncomplicated    Rehab Potential  Good    PT Frequency  2x / week    PT Duration  6 weeks    PT  Treatment/Interventions  ADLs/Self Care Home Management;Canalith Repostioning;Cryotherapy;Electrical Stimulation;Iontophoresis 4mg /ml Dexamethasone;Moist Heat;Traction;Ultrasound;DME Instruction;Gait training;Neuromuscular re-education;Balance training;Therapeutic exercise;Therapeutic activities;Functional mobility training;Stair training;Patient/family education;Manual techniques;Taping;Energy conservation;Dry needling;Passive range of motion;Spinal Manipulations;Joint Manipulations;Vasopneumatic Device    Consulted and Agree with Plan of Care  Patient       Patient  will benefit from skilled therapeutic intervention in order to improve the following deficits and impairments:  Decreased range of motion, Difficulty walking, Increased muscle spasms, Decreased knowledge of precautions, Decreased activity tolerance, Pain, Impaired flexibility, Improper body mechanics, Decreased balance, Decreased mobility, Decreased strength  Visit Diagnosis: Chronic bilateral low back pain with right-sided sciatica     Problem List Patient Active Problem List   Diagnosis Date Noted  . Major depression, recurrent, chronic ([place]) 10/24/2019  . Leg edema 10/24/2019  . Medication intolerance 10/24/2019  . Benign microscopic hematuria 09/17/2019  . Myalgia due to statin 11/23/2018  . Umbilical hernia 92/33/0076  . Sessile colonic polyp 10/04/2017  . Tobacco dependence 12/09/2015  . Hematuria 07/28/2015  . Status post right hip replacement 07/21/2015  . ACE inhibitor intolerance 06/17/2015  . DJD (degenerative joint disease), lumbar 06/17/2015  . Essential hypertension 06/17/2015  . Gastroesophageal reflux disease without esophagitis 06/17/2015  . Mixed hyperlipidemia 06/17/2015  . Morbid obesity ([place]) 06/17/2015  . Post-menopause on HRT (hormone replacement therapy) 06/17/2015  . 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 Primary osteoarthritis involving multiple joints 06/17/2015   Lyndee Hensen, PT, DPT 2:35 PM  11/14/19    [place], [place], [address] Phone: [phone]   Fax:  [phone]  Name: Maria Marquez MRN: [id number] Date of Birth: February 03, 1946

## 2019-11-19 ENCOUNTER — Ambulatory Visit (INDEPENDENT_AMBULATORY_CARE_PROVIDER_SITE_OTHER): Payer: Medicare HMO | Admitting: Psychology

## 2019-11-19 ENCOUNTER — Encounter: Payer: Medicare HMO | Admitting: Physical Therapy

## 2019-11-19 DIAGNOSIS — F4323 Adjustment disorder with mixed anxiety and depressed mood: Secondary | ICD-10-CM

## 2019-11-21 ENCOUNTER — Ambulatory Visit (INDEPENDENT_AMBULATORY_CARE_PROVIDER_SITE_OTHER): Payer: Medicare HMO | Admitting: Physical Therapy

## 2019-11-21 ENCOUNTER — Encounter (INDEPENDENT_AMBULATORY_CARE_PROVIDER_SITE_OTHER): Payer: Self-pay | Admitting: Family Medicine

## 2019-11-21 ENCOUNTER — Other Ambulatory Visit: Payer: Self-pay

## 2019-11-21 ENCOUNTER — Ambulatory Visit (INDEPENDENT_AMBULATORY_CARE_PROVIDER_SITE_OTHER): Payer: Medicare HMO | Admitting: Family Medicine

## 2019-11-21 VITALS — BP 103/66 | HR 64 | Temp 98.4°F | Ht 66.0 in | Wt 238.0 lb

## 2019-11-21 DIAGNOSIS — R7303 Prediabetes: Secondary | ICD-10-CM | POA: Diagnosis not present

## 2019-11-21 DIAGNOSIS — M5441 Lumbago with sciatica, right side: Secondary | ICD-10-CM

## 2019-11-21 DIAGNOSIS — E559 Vitamin D deficiency, unspecified: Secondary | ICD-10-CM

## 2019-11-21 DIAGNOSIS — Z6838 Body mass index (BMI) 38.0-38.9, adult: Secondary | ICD-10-CM

## 2019-11-21 DIAGNOSIS — I10 Essential (primary) hypertension: Secondary | ICD-10-CM | POA: Diagnosis not present

## 2019-11-21 DIAGNOSIS — G8929 Other chronic pain: Secondary | ICD-10-CM

## 2019-11-21 MED ORDER — VITAMIN D (ERGOCALCIFEROL) 1.25 MG (50000 UNIT) PO CAPS: 50000.0000 [IU] | ORAL_CAPSULE | ORAL | 0 refills | Status: DC

## 2019-11-22 NOTE — Progress Notes (Signed)
Chief Complaint:   OBESITY Maria Marquez is here to discuss her progress with her obesity treatment plan along with follow-up of her obesity related diagnoses. Maria Marquez is on the Category 2 Plan and states she is following her eating plan approximately 80% of the time. Maria Marquez states she is doing physical therapy and home exercises for 15-45 minutes 6 times per week.  Today's visit was #: 75 Starting weight: 244 lbs Starting date: 02/22/2018 Today's weight: 238 lbs Today's date: 11/21/2019 Total lbs lost to date: 6 Total lbs lost since last in-office visit: 3  Interim History: Maria Marquez feels that the Category 2 meal plan offers adequate protein and calories, and she states that she feels much better. She has been in formal physical therapy twice weekly the last month, treating lower lumber degenerative disc disease.  Subjective:   1. Vitamin D deficiency Maria Marquez's Vit D level on 11/01/2018 was 42.6. She is on prescription strength Vit D supplementation.  2. Essential hypertension Maria Marquez's ambulatory blood pressure readings, systolic 245-809'X and diastolic 83-38'S. She denies chest pain or dyspnea with exertion. She is currently on losartan-hydrochlorothiazide 50-12.5 mg q daily and hydrochlorothiazide 12.5 mg q daily, her primary care physician added for lower extremity edema.  3. Pre-diabetes Maria Marquez's last A1c on 11/01/2018 was 5.7. She is not on metformin.  Assessment/Plan:   1. Vitamin D deficiency Low Vitamin D level contributes to fatigue and are associated with obesity, breast, and colon cancer. We will refill prescription Vitamin D for 1 month. Maria Marquez will follow-up for routine testing of Vitamin D, at least 2-3 times per year to avoid over-replacement. We will recheck labs at her next office visit.  - Vitamin D, Ergocalciferol, (DRISDOL) 1.25 MG (50000 UNIT) CAPS capsule; Take 1 capsule (50,000 Units total) by mouth every 7 (seven) days.  Dispense: 4 capsule; Refill: 0  2. Essential  hypertension Maria Marquez is working on healthy weight loss and exercise to improve blood pressure control. We will watch for signs of hypotension as she continues her lifestyle modifications. Maria Marquez agreed to continue her current anti-hypertensive therapy.   3. Pre-diabetes Maria Marquez will continue to work on weight loss, exercise, and decreasing simple carbohydrates to help decrease the risk of diabetes. We will recheck labs at her next office visit.  4. Class 2 severe obesity with serious comorbidity and body mass index (BMI) of 38.0 to 38.9 in adult, unspecified obesity type (HCC) Maria Marquez is currently in the action stage of change. As such, her goal is to continue with weight loss efforts. She has agreed to the Category 2 Plan.   Exercise goals: As is.  Behavioral modification strategies: increasing lean protein intake and no skipping meals.  Maria Marquez has agreed to follow-up with our clinic in 3 weeks. She was informed of the importance of frequent follow-up visits to maximize her success with intensive lifestyle modifications for her multiple health conditions.   Objective:   Blood pressure 103/66, pulse 64, temperature 98.4 F (36.9 C), temperature source Oral, height 5\' 6"  (1.676 m), weight 238 lb (108 kg), SpO2 97 %. Body mass index is 38.41 kg/m.  General: Cooperative, alert, well developed, in no acute distress. HEENT: Conjunctivae and lids unremarkable. Cardiovascular: Regular rhythm.  Lungs: Normal work of breathing. Neurologic: No focal deficits.   Lab Results  Component Value Date   CREATININE 1.34 (H) 09/17/2019   BUN 24 (H) 09/17/2019   NA 137 09/17/2019   K 4.1 09/17/2019   CL 103 09/17/2019   CO2 29 09/17/2019  Lab Results  Component Value Date   ALT 19 06/14/2019   AST 14 06/14/2019   ALKPHOS 72 06/14/2019   BILITOT 0.8 06/14/2019   Lab Results  Component Value Date   HGBA1C 5.7 (H) 11/01/2018   HGBA1C 5.8 (H) 06/13/2018   HGBA1C 6.4 01/17/2018   HGBA1C 6.1  09/23/2015   Lab Results  Component Value Date   INSULIN 11.7 11/01/2018   INSULIN 15.0 06/13/2018   INSULIN 14.0 02/22/2018   Lab Results  Component Value Date   TSH 2.81 06/14/2019   Lab Results  Component Value Date   CHOL 199 06/14/2019   HDL 40.70 06/14/2019   LDLCALC 129 (H) 06/14/2019   LDLDIRECT 197.0 01/13/2018   TRIG 147.0 06/14/2019   CHOLHDL 5 06/14/2019   Lab Results  Component Value Date   WBC 8.3 06/14/2019   HGB 14.3 06/14/2019   HCT 42.3 06/14/2019   MCV 95.0 06/14/2019   PLT 247.0 06/14/2019   No results found for: IRON, TIBC, FERRITIN  Attestation Statements:   Reviewed by clinician on day of visit: allergies, medications, problem list, medical history, surgical history, family history, social history, and previous encounter notes.  Time spent on visit including pre-visit chart review and post-visit care and charting was 35 minutes.    I, Trixie Dredge, am acting as transcriptionist for [name], MD.  I have reviewed the above documentation for accuracy and completeness, and I agree with the above. -  [name], MD

## 2019-11-23 ENCOUNTER — Encounter: Payer: Self-pay | Admitting: Physical Therapy

## 2019-11-23 NOTE — Therapy (Signed)
[facility] [address] [place], [place], [address] Phone: [phone]   Fax:  [phone]  Physical Therapy Treatment  Patient Details  Name: Maria Marquez MRN: [id number] Date of Birth: 03-18-1946 Referring Provider (PT): Billey Chang   Encounter Date: 11/21/2019  PT End of Session - 11/23/19 1403    Visit Number  8    Number of Visits  12    Date for PT Re-Evaluation  12/06/19    Authorization Type  [employer]    PT Start Time  1300    PT Stop Time  1342    PT Time Calculation (min)  42 min    Activity Tolerance  Patient tolerated treatment well    Behavior During Therapy  [facility] for tasks assessed/performed       Past Medical History:  Diagnosis Date  . Acute blood loss anemia   . Arthritis   . Back pain   . Bilious vomiting with nausea   . Constipation   . GERD (gastroesophageal reflux disease)   . HLD (hyperlipidemia)   . Hypertension   . Hypothyroid   . Joint pain   . Leg edema   . Leukocytosis   . Multiple food allergies   . Myalgia due to statin 11/23/2018  . Osteoarthritis of hip    Right  . Postmenopausal hormone replacement therapy 06/17/2015  . Prediabetes   . Sessile colonic polyp 10/04/2017   Colonoscopy 12/2015, Dr. Fuller Plan, repeat q 5 years.    Past Surgical History:  Procedure Laterality Date  . EYE SURGERY  1987  . HERNIA REPAIR    . THYROID LOBECTOMY     right side  . TOTAL HIP ARTHROPLASTY Right 01/28/2016   Procedure: RIGHT TOTAL HIP ARTHROPLASTY ANTERIOR APPROACH;  Surgeon: [name], MD;  Location: [facility];  Service: Orthopedics;  Laterality: Right;    There were no vitals filed for this visit.  Subjective Assessment - 11/23/19 1402    Subjective  Pt states improving pain in back. She feels she can do activity for longer time, without pain. Does continue to have soreness with prolonged standing/walking .    Currently in Pain?  Yes    Pain Score  4     Pain Location  Back    Pain  Orientation  Right;Left    Pain Descriptors / Indicators  Aching    Pain Type  Chronic pain    Pain Onset  More than a month ago    Pain Frequency  Intermittent                       [facility] Adult PT Treatment/Exercise - 11/23/19 0001      Exercises   Exercises  Lumbar      Lumbar Exercises: Stretches   Single Knee to Chest Stretch  2 reps;30 seconds    Lower Trunk Rotation  5 reps;10 seconds    Pelvic Tilt  20 reps    Figure 4 Stretch  2 reps;30 seconds    Figure 4 Stretch Limitations  seated    Other Lumbar Stretch Exercise  Standing QL stretch 30 sec x2; bil       Lumbar Exercises: Aerobic   Recumbent Bike  L2 x 6 min;       Lumbar Exercises: Standing   Row  20 reps    Theraband Level (Row)  Level 3 (Green)    Other Standing Lumbar Exercises  March x20; Hip abd 2x10  bil;        Lumbar Exercises: Supine   Bent Knee Raise  20 reps    Bridge  20 reps    Straight Leg Raise  20 reps    Other Supine Lumbar Exercises  Modified crunch x20       Manual Therapy   Manual Therapy  Joint mobilization;Soft tissue mobilization;Manual Traction    Joint Mobilization  PA mobs lumbar spine    Soft tissue mobilization  STM/DTM  bil lumbar region     Manual Traction  L LE long leg distraction x2 min for back                PT Short Term Goals - 10/30/19 2595      PT SHORT TERM GOAL #1   Title  Pt to be independent with initial HEP    Time  2    Period  Weeks    Status  New    Target Date  11/08/19      PT SHORT TERM GOAL #2   Title  Pt to demo ability to self correct seated posture in clinic at least 75 % of the time.    Time  2    Period  Weeks    Status  New    Target Date  11/08/19        PT Long Term Goals - 10/30/19 0822      PT LONG TERM GOAL #1   Title  Pt to be independent with final HEP    Time  6    Period  Weeks    Status  New    Target Date  12/06/19      PT LONG TERM GOAL #2   Title  Pt to report decreased pain in low back to  0-3/10 with activity    Time  6    Period  Weeks    Status  New    Target Date  12/06/19      PT LONG TERM GOAL #3   Title  Pt to demo proper mechanics for bend/lift, to improve ability and pain with IADLs    Time  6    Period  Weeks    Status  New    Target Date  12/06/19      PT LONG TERM GOAL #4   Title  Pt to report ability for ambulation up to 1 mile, without pain greater than 3/10 in back, to improve ability for exercise and functional activity    Time  6    Period  Weeks    Status  New    Target Date  12/06/19            Plan - 11/23/19 1404    Clinical Impression Statement  Pt progressing well with ther ex and strengthening. Back pain improving overall, still sore with prolonged standing, walking. Pt to benefit from continued care.    Personal Factors and Comorbidities  Fitness;Time since onset of injury/illness/exacerbation    Examination-Activity Limitations  Bathing;Locomotion Level;Bend;Sit;Squat;Stand;Lift    Examination-Participation Restrictions  Meal Prep;Cleaning;Community Activity;Shop;Laundry    Stability/Clinical Decision Making  Stable/Uncomplicated    Rehab Potential  Good    PT Frequency  2x / week    PT Duration  6 weeks    PT Treatment/Interventions  ADLs/Self Care Home Management;Canalith Repostioning;Cryotherapy;Electrical Stimulation;Iontophoresis 4mg /ml Dexamethasone;Moist Heat;Traction;Ultrasound;DME Instruction;Gait training;Neuromuscular re-education;Balance training;Therapeutic exercise;Therapeutic activities;Functional mobility training;Stair training;Patient/family education;Manual techniques;Taping;Energy conservation;Dry needling;Passive range of motion;Spinal Manipulations;Joint Manipulations;Vasopneumatic Device  Consulted and Agree with Plan of Care  Patient       Patient will benefit from skilled therapeutic intervention in order to improve the following deficits and impairments:  Decreased range of motion, Difficulty walking,  Increased muscle spasms, Decreased knowledge of precautions, Decreased activity tolerance, Pain, Impaired flexibility, Improper body mechanics, Decreased balance, Decreased mobility, Decreased strength  Visit Diagnosis: Chronic bilateral low back pain with right-sided sciatica     Problem List Patient Active Problem List   Diagnosis Date Noted  . Major depression, recurrent, chronic ([place]) 10/24/2019  . Leg edema 10/24/2019  . Medication intolerance 10/24/2019  . Benign microscopic hematuria 09/17/2019  . Myalgia due to statin 11/23/2018  . Umbilical hernia 11/11/2246  . Sessile colonic polyp 10/04/2017  . Tobacco dependence 12/09/2015  . Hematuria 07/28/2015  . Status post right hip replacement 07/21/2015  . ACE inhibitor intolerance 06/17/2015  . DJD (degenerative joint disease), lumbar 06/17/2015  . Essential hypertension 06/17/2015  . Gastroesophageal reflux disease without esophagitis 06/17/2015  . Mixed hyperlipidemia 06/17/2015  . Morbid obesity ([place]) 06/17/2015  . Post-menopause on HRT (hormone replacement therapy) 06/17/2015  . 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 Primary osteoarthritis involving multiple joints 06/17/2015   Lyndee Hensen, PT, DPT 2:09 PM  11/23/19    [facility] [place], [place], [address] Phone: [phone]   Fax:  [phone]  Name: Maria Marquez MRN: [id number] Date of Birth: December 02, 1945

## 2019-11-26 ENCOUNTER — Encounter: Payer: Medicare HMO | Admitting: Physical Therapy

## 2019-11-28 ENCOUNTER — Ambulatory Visit (INDEPENDENT_AMBULATORY_CARE_PROVIDER_SITE_OTHER): Payer: Medicare HMO | Admitting: Physical Therapy

## 2019-11-28 ENCOUNTER — Ambulatory Visit: Admitting: Family Medicine

## 2019-11-28 ENCOUNTER — Other Ambulatory Visit: Payer: Self-pay

## 2019-11-28 DIAGNOSIS — M5441 Lumbago with sciatica, right side: Secondary | ICD-10-CM

## 2019-11-28 DIAGNOSIS — G8929 Other chronic pain: Secondary | ICD-10-CM

## 2019-11-30 ENCOUNTER — Encounter: Payer: Self-pay | Admitting: Physical Therapy

## 2019-11-30 NOTE — Therapy (Addendum)
[facility] [address] [place], [place], [address] Phone: [phone]   Fax:  [phone]  Physical Therapy Treatment  Patient Details  Name: Maria Marquez MRN: [id number] Date of Birth: June 26, 1946 Referring Provider (PT): Billey Chang   Encounter Date: 11/28/2019  PT End of Session - 11/30/19 2201     Visit Number  9    Number of Visits  12    Date for PT Re-Evaluation  12/06/19    Authorization Type  [employer]    PT Start Time  1302    PT Stop Time  3419    PT Time Calculation (min)  41 min    Activity Tolerance  Patient tolerated treatment well    Behavior During Therapy  [facility] for tasks assessed/performed        Past Medical History:  Diagnosis Date   Acute blood loss anemia    Arthritis    Back pain    Bilious vomiting with nausea    Constipation    GERD (gastroesophageal reflux disease)    HLD (hyperlipidemia)    Hypertension    Hypothyroid    Joint pain    Leg edema    Leukocytosis    Multiple food allergies    Myalgia due to statin 11/23/2018   Osteoarthritis of hip    Right   Postmenopausal hormone replacement therapy 06/17/2015   Prediabetes    Sessile colonic polyp 10/04/2017   Colonoscopy 12/2015, Dr. Fuller Plan, repeat q 5 years.    Past Surgical History:  Procedure Laterality Date   EYE SURGERY  1987   HERNIA REPAIR     THYROID LOBECTOMY     right side   TOTAL HIP ARTHROPLASTY Right 01/28/2016   Procedure: RIGHT TOTAL HIP ARTHROPLASTY ANTERIOR APPROACH;  Surgeon: [name], MD;  Location: [facility];  Service: Orthopedics;  Laterality: Right;    There were no vitals filed for this visit.  Subjective Assessment - 11/30/19 2201     Subjective  Pt states variable pain, better overall, but increased pain in last 2 days.    Currently in Pain?  Yes    Pain Score  4     Pain Location  Back    Pain Orientation  Right;Left    Pain Descriptors / Indicators  Aching    Pain Type  Chronic pain    Pain  Onset  More than a month ago    Pain Frequency  Intermittent                        [facility] Adult PT Treatment/Exercise - 11/30/19 0001       Exercises   Exercises  Lumbar      Lumbar Exercises: Stretches   Single Knee to Chest Stretch  2 reps;30 seconds    Figure 4 Stretch  2 reps;30 seconds    Figure 4 Stretch Limitations  seated      Lumbar Exercises: Aerobic   Recumbent Bike  L2 x 8 min;       Lumbar Exercises: Standing   Other Standing Lumbar Exercises  March x20; Hip abd 2x10 bil;        Lumbar Exercises: Supine   Bent Knee Raise  20 reps    Bridge  20 reps    Other Supine Lumbar Exercises  Modified crunch x20       Lumbar Exercises: Sidelying   Hip Abduction  Both;15 reps      Manual  Therapy   Manual Therapy  Joint mobilization;Soft tissue mobilization;Manual Traction    Joint Mobilization  PA mobs lumbar spine    Soft tissue mobilization  STM/DTM  bil lumbar region     Manual Traction  L LE long leg distraction x2 min for back                 PT Short Term Goals - 10/30/19 3785       PT SHORT TERM GOAL #1   Title  Pt to be independent with initial HEP    Time  2    Period  Weeks    Status  New    Target Date  11/08/19      PT SHORT TERM GOAL #2   Title  Pt to demo ability to self correct seated posture in clinic at least 75 % of the time.    Time  2    Period  Weeks    Status  New    Target Date  11/08/19         PT Long Term Goals - 10/30/19 0822       PT LONG TERM GOAL #1   Title  Pt to be independent with final HEP    Time  6    Period  Weeks    Status  New    Target Date  12/06/19      PT LONG TERM GOAL #2   Title  Pt to report decreased pain in low back to 0-3/10 with activity    Time  6    Period  Weeks    Status  New    Target Date  12/06/19      PT LONG TERM GOAL #3   Title  Pt to demo proper mechanics for bend/lift, to improve ability and pain with IADLs    Time  6    Period  Weeks    Status  New     Target Date  12/06/19      PT LONG TERM GOAL #4   Title  Pt to report ability for ambulation up to 1 mile, without pain greater than 3/10 in back, to improve ability for exercise and functional activity    Time  6    Period  Weeks    Status  New    Target Date  12/06/19             Plan - 11/30/19 2203     Clinical Impression Statement  Pt with soreness in central lumbar region today, minimal pain with palaption, but increased pain with standing/activity. Pt continues to have bothersome/variable pain. Pt to benefit from 1 more session to finalize HEP, then recommend d/c to HEP, and refer to ortho MD for other pain relief options.    Personal Factors and Comorbidities  Fitness;Time since onset of injury/illness/exacerbation    Examination-Activity Limitations  Bathing;Locomotion Level;Bend;Sit;Squat;Stand;Lift    Examination-Participation Restrictions  Meal Prep;Cleaning;Community Activity;Shop;Laundry    Stability/Clinical Decision Making  Stable/Uncomplicated    Rehab Potential  Good    PT Frequency  2x / week    PT Duration  6 weeks    PT Treatment/Interventions  ADLs/Self Care Home Management;Canalith Repostioning;Cryotherapy;Electrical Stimulation;Iontophoresis 77m/ml Dexamethasone;Moist Heat;Traction;Ultrasound;DME Instruction;Gait training;Neuromuscular re-education;Balance training;Therapeutic exercise;Therapeutic activities;Functional mobility training;Stair training;Patient/family education;Manual techniques;Taping;Energy conservation;Dry needling;Passive range of motion;Spinal Manipulations;Joint Manipulations;Vasopneumatic Device    Consulted and Agree with Plan of Care  Patient        Patient will benefit from skilled therapeutic  intervention in order to improve the following deficits and impairments:  Decreased range of motion, Difficulty walking, Increased muscle spasms, Decreased knowledge of precautions, Decreased activity tolerance, Pain, Impaired flexibility,  Improper body mechanics, Decreased balance, Decreased mobility, Decreased strength  Visit Diagnosis: Chronic bilateral low back pain with right-sided sciatica     Problem List Patient Active Problem List   Diagnosis Date Noted   Major depression, recurrent, chronic ([place]) 10/24/2019   Leg edema 10/24/2019   Medication intolerance 10/24/2019   Benign microscopic hematuria 09/17/2019   Myalgia due to statin 02/54/8628   Umbilical hernia 24/17/5301   Sessile colonic polyp 10/04/2017   Tobacco dependence 12/09/2015   Hematuria 07/28/2015   Status post right hip replacement 07/21/2015   ACE inhibitor intolerance 06/17/2015   DJD (degenerative joint disease), lumbar 06/17/2015   Essential hypertension 06/17/2015   Gastroesophageal reflux disease without esophagitis 06/17/2015   Mixed hyperlipidemia 06/17/2015   Morbid obesity ([place]) 06/17/2015   Post-menopause on HRT (hormone replacement therapy) 06/17/2015   Postoperative hypothyroidism 06/17/2015   Primary osteoarthritis involving multiple joints 06/17/2015   Lyndee Hensen, PT, DPT 10:06 PM  11/30/19   [facility] [address] [place], [place], [address] Phone: [phone]   Fax:  [phone]  Name: Maria Marquez MRN: [id number] Date of Birth: June 01, 1946  PHYSICAL THERAPY DISCHARGE SUMMARY  Visits from Start of Care: 9 Plan: Patient agrees to discharge.  Patient goals were met. Patient is being discharged due to meeting the stated rehab goals, will refer to MD for continued pain.      Lyndee Hensen, PT, DPT 3:29 PM  08/19/21

## 2019-12-03 ENCOUNTER — Encounter: Payer: Medicare HMO | Admitting: Physical Therapy

## 2019-12-05 ENCOUNTER — Encounter: Admitting: Physical Therapy

## 2019-12-14 ENCOUNTER — Ambulatory Visit: Payer: Medicare HMO | Admitting: Family Medicine

## 2019-12-17 ENCOUNTER — Other Ambulatory Visit (INDEPENDENT_AMBULATORY_CARE_PROVIDER_SITE_OTHER): Payer: Self-pay | Admitting: Family Medicine

## 2019-12-17 DIAGNOSIS — E559 Vitamin D deficiency, unspecified: Secondary | ICD-10-CM

## 2019-12-18 ENCOUNTER — Other Ambulatory Visit: Payer: Self-pay | Admitting: Family Medicine

## 2019-12-19 ENCOUNTER — Other Ambulatory Visit: Payer: Self-pay

## 2019-12-19 ENCOUNTER — Ambulatory Visit (INDEPENDENT_AMBULATORY_CARE_PROVIDER_SITE_OTHER): Payer: Medicare HMO | Admitting: Family Medicine

## 2019-12-19 ENCOUNTER — Other Ambulatory Visit: Payer: Self-pay | Admitting: Family Medicine

## 2019-12-19 ENCOUNTER — Encounter (INDEPENDENT_AMBULATORY_CARE_PROVIDER_SITE_OTHER): Payer: Self-pay | Admitting: Family Medicine

## 2019-12-19 VITALS — BP 101/54 | HR 79 | Temp 98.0°F | Ht 66.0 in | Wt 241.0 lb

## 2019-12-19 DIAGNOSIS — I10 Essential (primary) hypertension: Secondary | ICD-10-CM | POA: Diagnosis not present

## 2019-12-19 DIAGNOSIS — E7849 Other hyperlipidemia: Secondary | ICD-10-CM | POA: Diagnosis not present

## 2019-12-19 DIAGNOSIS — R739 Hyperglycemia, unspecified: Secondary | ICD-10-CM

## 2019-12-19 DIAGNOSIS — E559 Vitamin D deficiency, unspecified: Secondary | ICD-10-CM | POA: Diagnosis not present

## 2019-12-19 DIAGNOSIS — Z6839 Body mass index (BMI) 39.0-39.9, adult: Secondary | ICD-10-CM

## 2019-12-20 LAB — COMPREHENSIVE METABOLIC PANEL
ALT: 16 IU/L (ref 0–32)
AST: 12 IU/L (ref 0–40)
Albumin/Globulin Ratio: 2.1 (ref 1.2–2.2)
Albumin: 4.1 g/dL (ref 3.7–4.7)
Alkaline Phosphatase: 70 IU/L (ref 39–117)
BUN/Creatinine Ratio: 18 (ref 12–28)
BUN: 25 mg/dL (ref 8–27)
Bilirubin Total: 0.8 mg/dL (ref 0.0–1.2)
CO2: 22 mmol/L (ref 20–29)
Calcium: 9.1 mg/dL (ref 8.7–10.3)
Chloride: 103 mmol/L (ref 96–106)
Creatinine, Ser: 1.38 mg/dL — ABNORMAL HIGH (ref 0.57–1.00)
GFR calc Af Amer: 44 mL/min/{1.73_m2} — ABNORMAL LOW (ref >59)
GFR calc non Af Amer: 38 mL/min/{1.73_m2} — ABNORMAL LOW (ref >59)
Globulin, Total: 2 g/dL (ref 1.5–4.5)
Glucose: 104 mg/dL — ABNORMAL HIGH (ref 65–99)
Potassium: 3.8 mmol/L (ref 3.5–5.2)
Sodium: 140 mmol/L (ref 134–144)
Total Protein: 6.1 g/dL (ref 6.0–8.5)

## 2019-12-20 LAB — LIPID PANEL WITH LDL/HDL RATIO
Cholesterol, Total: 204 mg/dL — ABNORMAL HIGH (ref 100–199)
HDL: 42 mg/dL (ref >39)
LDL Chol Calc (NIH): 127 mg/dL — ABNORMAL HIGH (ref 0–99)
LDL/HDL Ratio: 3 ratio (ref 0.0–3.2)
Triglycerides: 196 mg/dL — ABNORMAL HIGH (ref 0–149)
VLDL Cholesterol Cal: 35 mg/dL (ref 5–40)

## 2019-12-20 LAB — VITAMIN D 25 HYDROXY (VIT D DEFICIENCY, FRACTURES): Vit D, 25-Hydroxy: 47.5 ng/mL (ref 30.0–100.0)

## 2019-12-20 LAB — HEMOGLOBIN A1C
Est. average glucose Bld gHb Est-mCnc: 128 mg/dL
Hgb A1c MFr Bld: 6.1 % — ABNORMAL HIGH (ref 4.8–5.6)

## 2019-12-20 LAB — INSULIN, RANDOM: INSULIN: 15.4 u[IU]/mL (ref 2.6–24.9)

## 2019-12-20 NOTE — Progress Notes (Signed)
Chief Complaint:   OBESITY Maria Marquez is here to discuss her progress with her obesity treatment plan along with follow-up of her obesity related diagnoses. Maria Marquez is on the Category 2 Plan and states she is following her eating plan approximately 50-60% of the time. Maria Marquez states she is doing a lot of gardening.  Today's visit was #: 73 Starting weight: 244 lbs Starting date: 02/22/2018 Today's weight: 241 lbs Today's date: 12/19/2019 Total lbs lost to date: 3 Total lbs lost since last in-office visit: 0  Interim History: Maria Marquez continues to struggle with weight loss and has gained back almost all of the weight she lost. She started at 247 lbs and dropped down to 218 lbs in approximately 6 months, and she is now up to 241 lbs. She has done a lot of gardening for exercise. She feels she snacks too much and she is mindful of her food, but she is not following a structured plan closely.  Subjective:   1. Essential hypertension Maria Marquez's blood pressure is low and she feels fatigued. Last GFR had decreased. She denies feeling lightheaded though.  2. Hyperglycemia Maria Marquez is working on diet, and she is due to have labs checked. She notes polyphagia.  3. Vitamin D deficiency Maria Marquez is stable on Vit D, and she is due for labs.  4. Other hyperlipidemia Maria Marquez is attempting to improve with diet. She denies chest pain, and she is due for labs.  Assessment/Plan:   1. Essential hypertension Maria Marquez is working on healthy weight loss and exercise to improve blood pressure control. We will watch for signs of hypotension as she continues her lifestyle modifications. Maria Marquez agreed to discontinue hydrochlorothiazide, and we will check labs today and follow up.  - Comprehensive metabolic panel  2. Hyperglycemia Fasting labs will be obtained today and results with be discussed with Maria Marquez in 2 weeks at her follow up visit. In the meanwhile Maria Marquez was started on a lower simple carbohydrate diet and will work on  weight loss efforts.  - Hemoglobin A1c - Insulin, random  3. Vitamin D deficiency Low Vitamin D level contributes to fatigue and are associated with obesity, breast, and colon cancer. Dominiqua agreed to continue taking prescription Vitamin D 50,000 IU every week and will follow-up for routine testing of Vitamin D, at least 2-3 times per year to avoid over-replacement. We will check labs today.  - VITAMIN D 25 Hydroxy (Vit-D Deficiency, Fractures)  4. Other hyperlipidemia Cardiovascular risk and specific lipid/LDL goals reviewed. We discussed several lifestyle modifications today and [name] will continue to work on diet, exercise and weight loss efforts. We will check labs today. Orders and follow up as documented in patient record.   - Lipid Panel With LDL/HDL Ratio  5. Class 2 severe obesity with serious comorbidity and body mass index (BMI) of 39.0 to 39.9 in adult, unspecified obesity type (HCC) Maria Marquez is currently in the action stage of change. As such, her goal is to continue with weight loss efforts. She has agreed to the Category 2 Plan.   Exercise goals: As is.  Behavioral modification strategies: increasing lean protein intake, meal planning and cooking strategies and better snacking choices.  Maria Marquez has agreed to follow-up with our clinic in 4 weeks. She was informed of the importance of frequent follow-up visits to maximize her success with intensive lifestyle modifications for her multiple health conditions.   Maria Marquez was informed we would discuss her lab results at her next visit unless there is a critical issue  that needs to be addressed sooner. Maria Marquez agreed to keep her next visit at the agreed upon time to discuss these results.  Objective:   Blood pressure (!) 101/54, pulse 79, temperature 98 F (36.7 C), temperature source Oral, height 5\' 6"  (1.676 m), weight 241 lb (109.3 kg), SpO2 100 %. Body mass index is 38.9 kg/m.  General: Cooperative, alert, well developed, in no  acute distress. HEENT: Conjunctivae and lids unremarkable. Cardiovascular: Regular rhythm.  Lungs: Normal work of breathing. Neurologic: No focal deficits.   Lab Results  Component Value Date   CREATININE 1.38 (H) 12/19/2019   BUN 25 12/19/2019   NA 140 12/19/2019   K 3.8 12/19/2019   CL 103 12/19/2019   CO2 22 12/19/2019   Lab Results  Component Value Date   ALT 16 12/19/2019   AST 12 12/19/2019   ALKPHOS 70 12/19/2019   BILITOT 0.8 12/19/2019   Lab Results  Component Value Date   HGBA1C 6.1 (H) 12/19/2019   HGBA1C 5.7 (H) 11/01/2018   HGBA1C 5.8 (H) 06/13/2018   HGBA1C 6.4 01/17/2018   HGBA1C 6.1 09/23/2015   Lab Results  Component Value Date   INSULIN 15.4 12/19/2019   INSULIN 11.7 11/01/2018   INSULIN 15.0 06/13/2018   INSULIN 14.0 02/22/2018   Lab Results  Component Value Date   TSH 2.81 06/14/2019   Lab Results  Component Value Date   CHOL 204 (H) 12/19/2019   HDL 42 12/19/2019   LDLCALC 127 (H) 12/19/2019   LDLDIRECT 197.0 01/13/2018   TRIG 196 (H) 12/19/2019   CHOLHDL 5 06/14/2019   Lab Results  Component Value Date   WBC 8.3 06/14/2019   HGB 14.3 06/14/2019   HCT 42.3 06/14/2019   MCV 95.0 06/14/2019   PLT 247.0 06/14/2019   No results found for: IRON, TIBC, FERRITIN  Obesity Behavioral Intervention Documentation for Insurance:   Approximately 15 minutes were spent on the discussion below.  ASK: We discussed the diagnosis of obesity with Maria Marquez today and Maria Marquez agreed to give [name] permission to discuss obesity behavioral modification therapy today.  ASSESS: Maria Marquez has the diagnosis of obesity and her BMI today is 38.92. Maria Marquez is in the action stage of change.   ADVISE: Maria Marquez was educated on the multiple health risks of obesity as well as the benefit of weight loss to improve her health. She was advised of the need for long term treatment and the importance of lifestyle modifications to improve her current health and to decrease her risk of  future health problems.  AGREE: Multiple dietary modification options and treatment options were discussed and Maria Marquez agreed to follow the recommendations documented in the above note.  ARRANGE: Kennisha was educated on the importance of frequent visits to treat obesity as outlined per CMS and USPSTF guidelines and agreed to schedule her next follow up appointment today.  Attestation Statements:   Reviewed by clinician on day of visit: allergies, medications, problem list, medical history, surgical history, family history, social history, and previous encounter notes.   I, Trixie Dredge, am acting as transcriptionist for [name], MD.  I have reviewed the above documentation for accuracy and completeness, and I agree with the above. -  [name], MD

## 2019-12-21 ENCOUNTER — Encounter (INDEPENDENT_AMBULATORY_CARE_PROVIDER_SITE_OTHER): Payer: Self-pay | Admitting: Family Medicine

## 2019-12-24 NOTE — Addendum Note (Signed)
Addended by: Trixie Dredge on: 12/24/2019 03:05 PM   Modules accepted: Orders

## 2019-12-25 ENCOUNTER — Telehealth: Payer: Self-pay | Admitting: Family Medicine

## 2019-12-25 NOTE — Telephone Encounter (Signed)
I left a message asking the patient to call and schedule Medicare AWV with Loma Sousa ([place]) on 12/31/2019 after seeing Dr. Jonni Sanger.  I'm waiting for a call back to either confirm or decline the appointment. If patient calls back, please update appointment notes.  VDM ([place])

## 2019-12-31 ENCOUNTER — Ambulatory Visit (INDEPENDENT_AMBULATORY_CARE_PROVIDER_SITE_OTHER): Payer: Medicare HMO | Admitting: Family Medicine

## 2019-12-31 ENCOUNTER — Other Ambulatory Visit: Payer: Self-pay

## 2019-12-31 ENCOUNTER — Ambulatory Visit: Payer: Medicare HMO

## 2019-12-31 ENCOUNTER — Encounter: Payer: Self-pay | Admitting: Family Medicine

## 2019-12-31 VITALS — BP 118/70 | HR 74 | Temp 97.3°F | Resp 15 | Ht 66.0 in | Wt 246.6 lb

## 2019-12-31 VITALS — BP 118/70 | HR 74 | Temp 97.3°F | Ht 66.0 in | Wt 246.7 lb

## 2019-12-31 DIAGNOSIS — Z0000 Encounter for general adult medical examination without abnormal findings: Secondary | ICD-10-CM | POA: Diagnosis not present

## 2019-12-31 DIAGNOSIS — N1831 Chronic kidney disease, stage 3a: Secondary | ICD-10-CM

## 2019-12-31 DIAGNOSIS — R7303 Prediabetes: Secondary | ICD-10-CM

## 2019-12-31 DIAGNOSIS — R600 Localized edema: Secondary | ICD-10-CM | POA: Diagnosis not present

## 2019-12-31 DIAGNOSIS — Z789 Other specified health status: Secondary | ICD-10-CM

## 2019-12-31 DIAGNOSIS — I10 Essential (primary) hypertension: Secondary | ICD-10-CM

## 2019-12-31 DIAGNOSIS — F17200 Nicotine dependence, unspecified, uncomplicated: Secondary | ICD-10-CM | POA: Diagnosis not present

## 2019-12-31 DIAGNOSIS — E669 Obesity, unspecified: Secondary | ICD-10-CM | POA: Diagnosis not present

## 2019-12-31 HISTORY — DX: Chronic kidney disease, stage 3a: N18.31

## 2019-12-31 MED ORDER — SHINGRIX 50 MCG/0.5ML IM SUSR: 0.5000 mL | Freq: Once | INTRAMUSCULAR | 0 refills | Status: AC

## 2019-12-31 NOTE — Patient Instructions (Addendum)
Maria Marquez , Thank you for taking time to come for your Medicare Wellness Visit. I appreciate your ongoing commitment to your health goals. Please review the following plan we discussed and let me know if I can assist you in the future.   Screening recommendations/referrals: Colorectal Screening: up to date; last colonoscopy 12/23/15 Mammogram: up to date; last 10/10/19 Bone Density: up to date; last 02/27/18   Vision and Dental Exams: Recommended annual ophthalmology exams for early detection of glaucoma and other disorders of the eye Recommended annual dental exams for proper oral hygiene  Vaccinations: Influenza vaccine: up to date; last 06/14/19 Pneumococcal vaccine: up to date; last 06/10/16 Tdap vaccine: up to date; last 06/16/14  Shingles vaccine:  You may receive this vaccine at your local pharmacy. (see handout) Covid vaccine: recommended   Advanced directives: Please bring a copy of your POA (Power of Attorney) and/or Living Will to your next appointment.  Goals: Recommend to drink at least 6-8 8oz glasses of water per day and consume a balanced diet rich in fresh fruits and vegetables.   Next appointment: Please schedule your Annual Wellness Visit with your Nurse Health Advisor in one year.  Preventive Care 42 Years and Older, Female Preventive care refers to lifestyle choices and visits with your health care provider that can promote health and wellness. What does preventive care include?  A yearly physical exam. This is also called an annual well check.  Dental exams once or twice a year.  Routine eye exams. Ask your health care provider how often you should have your eyes checked.  Personal lifestyle choices, including:  Daily care of your teeth and gums.  Regular physical activity.  Eating a healthy diet.  Avoiding tobacco and drug use.  Limiting alcohol use.  Practicing safe sex.  Taking low-dose aspirin every day if recommended by your health care  provider.  Taking vitamin and mineral supplements as recommended by your health care provider. What happens during an annual well check? The services and screenings done by your health care provider during your annual well check will depend on your age, overall health, lifestyle risk factors, and family history of disease. Counseling  Your health care provider may ask you questions about your:  Alcohol use.  Tobacco use.  Drug use.  Emotional well-being.  Home and relationship well-being.  Sexual activity.  Eating habits.  History of falls.  Memory and ability to understand (cognition).  Work and work Statistician.  Reproductive health. Screening  You may have the following tests or measurements:  Height, weight, and BMI.  Blood pressure.  Lipid and cholesterol levels. These may be checked every 5 years, or more frequently if you are over 41 years old.  Skin check.  Lung cancer screening. You may have this screening every year starting at age 35 if you have a 30-pack-year history of smoking and currently smoke or have quit within the past 15 years.  Fecal occult blood test (FOBT) of the stool. You may have this test every year starting at age 40.  Flexible sigmoidoscopy or colonoscopy. You may have a sigmoidoscopy every 5 years or a colonoscopy every 10 years starting at age 71.  Hepatitis C blood test.  Hepatitis B blood test.  Sexually transmitted disease (STD) testing.  Diabetes screening. This is done by checking your blood sugar (glucose) after you have not eaten for a while (fasting). You may have this done every 1-3 years.  Bone density scan. This is done to screen  for osteoporosis. You may have this done starting at age 56.  Mammogram. This may be done every 1-2 years. Talk to your health care provider about how often you should have regular mammograms. Talk with your health care provider about your test results, treatment options, and if necessary, the  need for more tests. Vaccines  Your health care provider may recommend certain vaccines, such as:  Influenza vaccine. This is recommended every year.  Tetanus, diphtheria, and acellular pertussis (Tdap, Td) vaccine. You may need a Td booster every 10 years.  Zoster vaccine. You may need this after age 30.  Pneumococcal 13-valent conjugate (PCV13) vaccine. One dose is recommended after age 39.  Pneumococcal polysaccharide (PPSV23) vaccine. One dose is recommended after age 66. Talk to your health care provider about which screenings and vaccines you need and how often you need them. This information is not intended to replace advice given to you by your health care provider. Make sure you discuss any questions you have with your health care provider. Document Released: 09/05/2015 Document Revised: 04/28/2016 Document Reviewed: 06/10/2015 Elsevier Interactive Patient Education  2017 [employer] Prevention in the Home Falls can cause injuries. They can happen to people of all ages. There are many things you can do to make your home safe and to help prevent falls. What can I do on the outside of my home?  Regularly fix the edges of walkways and driveways and fix any cracks.  Remove anything that might make you trip as you walk through a door, such as a raised step or threshold.  Trim any bushes or trees on the path to your home.  Use bright outdoor lighting.  Clear any walking paths of anything that might make someone trip, such as rocks or tools.  Regularly check to see if handrails are loose or broken. Make sure that both sides of any steps have handrails.  Any raised decks and porches should have guardrails on the edges.  Have any leaves, snow, or ice cleared regularly.  Use sand or salt on walking paths during winter.  Clean up any spills in your garage right away. This includes oil or grease spills. What can I do in the bathroom?  Use night lights.  Install grab  bars by the toilet and in the tub and shower. Do not use towel bars as grab bars.  Use non-skid mats or decals in the tub or shower.  If you need to sit down in the shower, use a plastic, non-slip stool.  Keep the floor dry. Clean up any water that spills on the floor as soon as it happens.  Remove soap buildup in the tub or shower regularly.  Attach bath mats securely with double-sided non-slip rug tape.  Do not have throw rugs and other things on the floor that can make you trip. What can I do in the bedroom?  Use night lights.  Make sure that you have a light by your bed that is easy to reach.  Do not use any sheets or blankets that are too big for your bed. They should not hang down onto the floor.  Have a firm chair that has side arms. You can use this for support while you get dressed.  Do not have throw rugs and other things on the floor that can make you trip. What can I do in the kitchen?  Clean up any spills right away.  Avoid walking on wet floors.  Keep items that you  use a lot in easy-to-reach places.  If you need to reach something above you, use a strong step stool that has a grab bar.  Keep electrical cords out of the way.  Do not use floor polish or wax that makes floors slippery. If you must use wax, use non-skid floor wax.  Do not have throw rugs and other things on the floor that can make you trip. What can I do with my stairs?  Do not leave any items on the stairs.  Make sure that there are handrails on both sides of the stairs and use them. Fix handrails that are broken or loose. Make sure that handrails are as long as the stairways.  Check any carpeting to make sure that it is firmly attached to the stairs. Fix any carpet that is loose or worn.  Avoid having throw rugs at the top or bottom of the stairs. If you do have throw rugs, attach them to the floor with carpet tape.  Make sure that you have a light switch at the top of the stairs and the  bottom of the stairs. If you do not have them, ask someone to add them for you. What else can I do to help prevent falls?  Wear shoes that:  Do not have high heels.  Have rubber bottoms.  Are comfortable and fit you well.  Are closed at the toe. Do not wear sandals.  If you use a stepladder:  Make sure that it is fully opened. Do not climb a closed stepladder.  Make sure that both sides of the stepladder are locked into place.  Ask someone to hold it for you, if possible.  Clearly mark and make sure that you can see:  Any grab bars or handrails.  First and last steps.  Where the edge of each step is.  Use tools that help you move around (mobility aids) if they are needed. These include:  Canes.  Walkers.  Scooters.  Crutches.  Turn on the lights when you go into a dark area. Replace any light bulbs as soon as they burn out.  Set up your furniture so you have a clear path. Avoid moving your furniture around.  If any of your floors are uneven, fix them.  If there are any pets around you, be aware of where they are.  Review your medicines with your doctor. Some medicines can make you feel dizzy. This can increase your chance of falling. Ask your doctor what other things that you can do to help prevent falls. This information is not intended to replace advice given to you by your health care provider. Make sure you discuss any questions you have with your health care provider. Document Released: 06/05/2009 Document Revised: 01/15/2016 Document Reviewed: 09/13/2014 Elsevier Interactive Patient Education  2017 [employer].

## 2019-12-31 NOTE — Progress Notes (Signed)
Subjective  CC:  Chief Complaint  Patient presents with  . Hypertension    states always >118/80 at home  . Nevus    mid-back, Maria Marquez with PT suggested she get it checked  . Leg Swelling    GFR 38 last week    HPI: Maria Marquez is a 74 y.o. female who presents to the office today to address the problems listed above in the chief complaint.  Hypertension f/u: running low after adding back to full dose hctz to help with leg swelling. Dr. Leafy Ro recommended stopping the hctz but instead, Maria Marquez has been taking xtra. Has foot swelling: "feels like my feet are going to explode". Unfortunately has regained her weight. On ARB for renal protection: has  ckd stage 3, likely nephrosclerosis. Nl albumin and no proteinuria. She denies SOB. She was intolerant to lasix: caused itching. Intolerant to spironalactone. Feels tired and washed out. Denies lightheadedness, palpitations or cp.   Edema: see above  Smoker - not wanting to quit at this time  Obesity: struggling. Seeing Dr. Leafy Ro  A1c now in prediabetic range again.   Due AWV and shingrix  Assessment  1. Essential hypertension   2. ACE inhibitor intolerance   3. Leg edema   4. Tobacco dependence   5. Chronic kidney disease, stage 3a   6. Prediabetes   7. Obesity (BMI 30-39.9)      Plan    Hypertension f/u: BP is low; stop hctz (educated on taking too much of this) and need to check potassium levels. Continue low dose arb/hctz for bp, renal protection and swelling. Hypotension could be contributing to her fatigue  Edema: needs to lose weight, elevated legs, keep low sodium diet and can try compression stockings. Could try bumex to see if she tolerates that but we both defer for now. Tr to 1+ pitting edema only present today.   CKD: education. Avoid nephrotoxins, control bp   Prediabetes: needs improved diet.   Smoker  Education regarding management of these chronic disease states was given. Management strategies  discussed on successive visits include dietary and exercise recommendations, goals of achieving and maintaining IBW, and lifestyle modifications aiming for adequate sleep and minimizing stressors.   Follow up: No follow-ups on file.  Orders Placed This Encounter  Procedures  . Comprehensive metabolic panel   Meds ordered this encounter  Medications  . Zoster Vaccine Adjuvanted [facility]) injection    Sig: Inject 0.5 mLs into the muscle once for 1 dose. Please give 2nd dose 2-6 months after first dose    Dispense:  2 each    Refill:  0      BP Readings from Last 3 Encounters:  12/31/19 118/70  12/31/19 118/70  12/19/19 (!) 101/54   Wt Readings from Last 3 Encounters:  12/31/19 246 lb 11.1 oz (111.9 kg)  12/31/19 246 lb 9.6 oz (111.9 kg)  12/19/19 241 lb (109.3 kg)    Lab Results  Component Value Date   CHOL 204 (H) 12/19/2019   CHOL 199 06/14/2019   CHOL 222 (H) 11/01/2018   Lab Results  Component Value Date   HDL 42 12/19/2019   HDL 40.70 06/14/2019   HDL 40 11/01/2018   Lab Results  Component Value Date   LDLCALC 127 (H) 12/19/2019   LDLCALC 129 (H) 06/14/2019   LDLCALC 151 (H) 11/01/2018   Lab Results  Component Value Date   TRIG 196 (H) 12/19/2019   TRIG 147.0 06/14/2019   TRIG 155 (H) 11/01/2018  Lab Results  Component Value Date   CHOLHDL 5 06/14/2019   CHOLHDL 4.9 (H) 02/22/2018   CHOLHDL 7 01/13/2018   Lab Results  Component Value Date   LDLDIRECT 197.0 01/13/2018   Lab Results  Component Value Date   CREATININE 1.38 (H) 12/19/2019   BUN 25 12/19/2019   NA 140 12/19/2019   K 3.8 12/19/2019   CL 103 12/19/2019   CO2 22 12/19/2019   Lab Results  Component Value Date   HGBA1C 6.1 (H) 12/19/2019   HGBA1C 5.7 (H) 11/01/2018   HGBA1C 5.8 (H) 06/13/2018      The 10-year ASCVD risk score Mikey Bussing DC Jr., et al., 2013) is: 19.4%   Values used to calculate the score:     Age: 76 years     Sex: Female     Is Non-Hispanic African American:  No     Diabetic: No     Tobacco smoker: Yes     Systolic Blood Pressure: 481 mmHg     Is BP treated: Yes     HDL Cholesterol: 42 mg/dL     Total Cholesterol: 204 mg/dL  I reviewed the patients updated PMH, FH, and SocHx.    Patient Active Problem List   Diagnosis Date Noted  . Major depression, recurrent, chronic ([place]) 10/24/2019    Priority: High  . Medication intolerance 10/24/2019    Priority: High  . Myalgia due to statin 11/23/2018    Priority: High  . Tobacco dependence 12/09/2015    Priority: High  . Essential hypertension 06/17/2015    Priority: High  . Mixed hyperlipidemia 06/17/2015    Priority: High  . Morbid obesity ([place]) 06/17/2015    Priority: High  . Post-menopause on HRT (hormone replacement therapy) 06/17/2015    Priority: High  . Postoperative hypothyroidism 06/17/2015    Priority: High  . Primary osteoarthritis involving multiple joints 06/17/2015    Priority: High  . Sessile colonic polyp 10/04/2017    Priority: Medium  . Status post right hip replacement 07/21/2015    Priority: Medium  . ACE inhibitor intolerance 06/17/2015    Priority: Medium  . DJD (degenerative joint disease), lumbar 06/17/2015    Priority: Medium  . Leg edema 10/24/2019    Priority: Low  . Benign microscopic hematuria 09/17/2019    Priority: Low  . Umbilical hernia 85/63/1497    Priority: Low  . Chronic kidney disease, stage 3a 12/31/2019  . Prediabetes 12/31/2019  . Obesity (BMI 30-39.9) 12/31/2019    Allergies: Lasix [furosemide], Rosuvastatin, Shellfish allergy, Sulfa antibiotics, Latex, Lisinopril, and Tramadol  Social History: Patient  reports that she has been smoking cigarettes. She has been smoking about 0.25 packs per day. She uses smokeless tobacco. She reports current alcohol use. She reports that she does not use drugs.  Current Meds  Medication Sig  . albuterol (VENTOLIN HFA) 108 (90 Base) MCG/ACT inhaler Inhale 1-2 puffs into the lungs every 6 (six)  hours as needed for wheezing or shortness of breath.  [name] aspirin 81 MG chewable tablet Chew by mouth daily.  [name] EPINEPHrine (EPIPEN 2-PAK) 0.3 mg/0.3 mL IJ SOAJ injection as needed.  [name] estradiol-norethindrone (ACTIVELLA) 1-0.5 MG tablet Take 1 tablet by mouth daily.  [name] ezetimibe (ZETIA) 10 MG tablet TAKE 1 TABLET EVERY DAY  . levothyroxine (SYNTHROID) 137 MCG tablet TAKE 1 TABLET (137 MCG TOTAL) BY MOUTH AT BEDTIME.  [name] losartan-hydrochlorothiazide (HYZAAR) 50-12.5 MG tablet Take 1 tablet by mouth daily.  . simvastatin (ZOCOR) 10 MG  tablet Take 1 tablet (10 mg total) by mouth 2 (two) times a week.  . Vitamin D, Ergocalciferol, (DRISDOL) 1.25 MG (50000 UNIT) CAPS capsule Take 1 capsule (50,000 Units total) by mouth every 7 (seven) days.  . [DISCONTINUED] hydrochlorothiazide (HYDRODIURIL) 12.5 MG tablet Take 12.5 mg by mouth daily.    Review of Systems: Cardiovascular: negative for chest pain, palpitations, leg swelling, orthopnea Respiratory: negative for SOB, wheezing or persistent cough Gastrointestinal: negative for abdominal pain Genitourinary: negative for dysuria or gross hematuria + mm cramps in toes  Objective  Vitals: BP 118/70   Pulse 74   Temp (!) 97.3 F (36.3 C) (Temporal)   Resp 15   Ht 5\' 6"  (1.676 m)   Wt 246 lb 9.6 oz (111.9 kg)   SpO2 98%   BMI 39.80 kg/m  General: no acute distress  Psych:  Alert and oriented, normal mood and affect HEENT:  Normocephalic, atraumatic, supple neck  Cardiovascular:  RRR without murmur. Tr to 1+ pitting BLE edema Respiratory:  Good breath sounds bilaterally, CTAB with normal respiratory effort Skin:  Warm, no rashes Neurologic:   Mental status is normal   Commons side effects, risks, benefits, and alternatives for medications and treatment plan prescribed today were discussed, and the patient expressed understanding of the given instructions. Patient is instructed to call or message via MyChart if he/she has any questions or concerns  regarding our treatment plan. No barriers to understanding were identified. We discussed Red Flag symptoms and signs in detail. Patient expressed understanding regarding what to do in case of urgent or emergency type symptoms.   Medication list was reconciled, printed and provided to the patient in AVS. Patient instructions and summary information was reviewed with the patient as documented in the AVS. This note was prepared with assistance of Dragon voice recognition software. Occasional wrong-word or sound-a-like substitutions may have occurred due to the inherent limitations of voice recognition software  This visit occurred during the SARS-CoV-2 public health emergency.  Safety protocols were in place, including screening questions prior to the visit, additional usage of staff PPE, and extensive cleaning of exam room while observing appropriate contact time as indicated for disinfecting solutions.

## 2019-12-31 NOTE — Patient Instructions (Signed)
Please return in 6 months for your annual complete physical; please come fasting.  Your leg swelling can be improved with elevating your legs, compression stockings and limiting salt in the diet.  Weight loss will also be helpful.  Your sugar tests are in the prediabetic range.   Your blood pressure is running low so please stop the HCTZ pill. We will leave the losartan and hctz combination pill as is. If your swelling worsens, we can try another diuretic however you had itching with lasix when we tried it.   I will release your lab results to you on your MyChart account with further instructions. Please reply with any questions.   Please take the prescription for Shingrix to the pharmacy so they may administer the vaccinations. Your insurance will then cover the injections.   If you have any questions or concerns, please don't hesitate to send me a message via MyChart or call the office at [phone]. Thank you for visiting with [name] today! It's our pleasure caring for you.   Edema  Edema is an abnormal buildup of fluids in the body tissues and under the skin. Swelling of the legs, feet, and ankles is a common symptom that becomes more likely as you get older. Swelling is also common in looser tissues, like around the eyes. When the affected area is squeezed, the fluid may move out of that spot and leave a dent for a few moments. This dent is called pitting edema. There are many possible causes of edema. Eating too much salt (sodium) and being on your feet or sitting for a long time can cause edema in your legs, feet, and ankles. Hot weather may make edema worse. Common causes of edema include:  Heart failure.  Liver or kidney disease.  Weak leg blood vessels.  Cancer.  An injury.  Pregnancy.  Medicines.  Being obese.  Low protein levels in the blood. Edema is usually painless. Your skin may look swollen or shiny. Follow these instructions at home:  Keep the affected body  part raised (elevated) above the level of your heart when you are sitting or lying down.  Do not sit still or stand for long periods of time.  Do not wear tight clothing. Do not wear garters on your upper legs.  Exercise your legs to get your circulation going. This helps to move the fluid back into your blood vessels, and it may help the swelling go down.  Wear elastic bandages or support stockings to reduce swelling as told by your health care provider.  Eat a low-salt (low-sodium) diet to reduce fluid as told by your health care provider.  Depending on the cause of your swelling, you may need to limit how much fluid you drink (fluid restriction).  Take over-the-counter and prescription medicines only as told by your health care provider. Contact a health care provider if:  Your edema does not get better with treatment.  You have heart, liver, or kidney disease and have symptoms of edema.  You have sudden and unexplained weight gain. Get help right away if:  You develop shortness of breath or chest pain.  You cannot breathe when you lie down.  You develop pain, redness, or warmth in the swollen areas.  You have heart, liver, or kidney disease and suddenly get edema.  You have a fever and your symptoms suddenly get worse. Summary  Edema is an abnormal buildup of fluids in the body tissues and under the skin.  Eating too much  salt (sodium) and being on your feet or sitting for a long time can cause edema in your legs, feet, and ankles.  Keep the affected body part raised (elevated) above the level of your heart when you are sitting or lying down. This information is not intended to replace advice given to you by your health care provider. Make sure you discuss any questions you have with your health care provider. Document Revised: 12/27/2018 Document Reviewed: 09/11/2016 Elsevier Patient Education  [place].

## 2019-12-31 NOTE — Progress Notes (Signed)
Subjective:   Missy Baksh is a 74 y.o. female who presents for an Initial Medicare Annual Wellness Visit.  Review of Systems     Cardiac Risk Factors include: hypertension;advanced age (>53men, >17 women);smoking/ tobacco exposure;dyslipidemia    Objective:    Today's Vitals   12/31/19 1444 12/31/19 1507  BP: 118/70   Pulse: 74   Temp: (!) 97.3 F (36.3 C)   TempSrc: Temporal   SpO2: 98%   Weight: 246 lb 11.1 oz (111.9 kg)   Height: 5\' 6"  (1.676 m)   PainSc:  0-No pain   Body mass index is 39.82 kg/m.  Advanced Directives 12/31/2019 10/30/2019 04/14/2019 01/28/2016 01/21/2016 12/09/2015 11/11/2015  Does Patient Have a Medical Advance Directive? Yes No Yes Yes Yes Yes Yes  Type of Advance Directive Living will;Healthcare Power of [place];Living will [place];Living will [place];Living will [place];Living will [place];Living will  Does patient want to make changes to medical advance directive? No - Patient declined - - No - Patient declined No - Patient declined No - Patient declined -  Copy of [place] in Chart? No - copy requested - - No - copy requested No - copy requested No - copy requested -  Would patient like information on creating a medical advance directive? - No - Patient declined - - - - -    Current Medications (verified) Outpatient Encounter Medications as of 12/31/2019  Medication Sig  . albuterol (VENTOLIN HFA) 108 (90 Base) MCG/ACT inhaler Inhale 1-2 puffs into the lungs every 6 (six) hours as needed for wheezing or shortness of breath.  [name] aspirin 81 MG chewable tablet Chew by mouth daily.  [name] EPINEPHrine (EPIPEN 2-PAK) 0.3 mg/0.3 mL IJ SOAJ injection as needed.  [name] estradiol-norethindrone (ACTIVELLA) 1-0.5 MG tablet Take 1 tablet by mouth daily.  [name] ezetimibe (ZETIA) 10 MG tablet TAKE 1 TABLET EVERY DAY  . levothyroxine (SYNTHROID)  137 MCG tablet TAKE 1 TABLET (137 MCG TOTAL) BY MOUTH AT BEDTIME.  [name] losartan-hydrochlorothiazide (HYZAAR) 50-12.5 MG tablet Take 1 tablet by mouth daily.  . simvastatin (ZOCOR) 10 MG tablet Take 1 tablet (10 mg total) by mouth 2 (two) times a week.  . Vitamin D, Ergocalciferol, (DRISDOL) 1.25 MG (50000 UNIT) CAPS capsule Take 1 capsule (50,000 Units total) by mouth every 7 (seven) days.   No facility-administered encounter medications on file as of 12/31/2019.    Allergies (verified) Lasix [furosemide], Rosuvastatin, Shellfish allergy, Sulfa antibiotics, Latex, Lisinopril, and Tramadol   History: Past Medical History:  Diagnosis Date  . Acute blood loss anemia   . Arthritis   . Back pain   . Bilious vomiting with nausea   . Chronic kidney disease, stage 3a 12/31/2019  . Constipation   . Gastroesophageal reflux disease without esophagitis 06/17/2015  . GERD (gastroesophageal reflux disease)   . HLD (hyperlipidemia)   . Hypertension   . Hypothyroid   . Joint pain   . Leg edema   . Leukocytosis   . Multiple food allergies   . Myalgia due to statin 11/23/2018  . Osteoarthritis of hip    Right  . Postmenopausal hormone replacement therapy 06/17/2015  . Prediabetes   . Sessile colonic polyp 10/04/2017   Colonoscopy 12/2015, Dr. Fuller Plan, repeat q 5 years.   Past Surgical History:  Procedure Laterality Date  . EYE SURGERY  1987  . HERNIA REPAIR    .  THYROID LOBECTOMY     right side  . TOTAL HIP ARTHROPLASTY Right 01/28/2016   Procedure: RIGHT TOTAL HIP ARTHROPLASTY ANTERIOR APPROACH;  Surgeon: [name], MD;  Location: [facility];  Service: Orthopedics;  Laterality: Right;   Family History  Problem Relation Age of Onset  . Arthritis Mother   . Breast cancer Mother   . Schizophrenia Mother   . Diabetes Father   . Heart disease Father   . Kidney disease Father   . Stroke Sister   . Lung cancer Brother   . Heart disease Brother   . Lung cancer Brother   . Heart disease Brother     . Vision loss Brother        Glaucoma  . Alcohol abuse Brother    Social History   Socioeconomic History  . Marital status: Widowed    Spouse name: Not on file  . Number of children: Not on file  . Years of education: Not on file  . Highest education level: Not on file  Occupational History  . Occupation: Retired  Tobacco Use  . Smoking status: Current Every Day Smoker    Packs/day: 0.25    Types: Cigarettes  . Smokeless tobacco: Current User  Substance and Sexual Activity  . Alcohol use: Yes    Comment: rarely  . Drug use: No  . Sexual activity: Never  Other Topics Concern  . Not on file  Social History Narrative  . Not on file   Social Determinants of Health   Financial Resource Strain:   . Difficulty of Paying Living Expenses:   Food Insecurity:   . Worried About Charity fundraiser in the Last Year:   . Arboriculturist in the Last Year:   Transportation Needs:   . Film/video editor (Medical):   [name] Lack of Transportation (Non-Medical):   Physical Activity:   . Days of Exercise per Week:   . Minutes of Exercise per Session:   Stress:   . Feeling of Stress :   Social Connections:   . Frequency of Communication with Friends and Family:   . Frequency of Social Gatherings with Friends and Family:   . Attends Religious Services:   . Active Member of Clubs or Organizations:   . Attends Archivist Meetings:   [name] Marital Status:     Tobacco Counseling Ready to quit: Not Answered Counseling given: Not Answered   Clinical Intake:  Pre-visit preparation completed: Yes  Pain Score: 0-No pain  Diabetes: No  How often do you need to have someone help you when you read instructions, pamphlets, or other written materials from your doctor or pharmacy?: 1 - Never  Interpreter Needed?: No  Information entered by :: Denman George LPN   Activities of Daily Living In your present state of health, do you have any difficulty performing the following  activities: 12/31/2019 12/31/2019  Hearing? N N  Vision? N N  Difficulty concentrating or making decisions? N N  Walking or climbing stairs? N N  Dressing or bathing? N N  Doing errands, shopping? N N  Preparing Food and eating ? N -  Using the Toilet? N -  In the past six months, have you accidently leaked urine? N -  Do you have problems with loss of bowel control? N -  Managing your Medications? N -  Managing your Finances? N -  Housekeeping or managing your Housekeeping? N -  Some recent data might be hidden  Immunizations and Health Maintenance Immunization History  Administered Date(s) Administered  . Fluad Quad(high Dose 65+) 06/14/2019  . Influenza, High Dose Seasonal PF 04/28/2016, 07/19/2017, 05/19/2018  . Influenza, Seasonal, Injecte, Preservative Fre 04/25/2015  . Influenza-Unspecified 07/12/2017  . Pneumococcal Conjugate-13 06/13/2015  . Pneumococcal Polysaccharide-23 06/10/2016  . Tdap 06/16/2014  . Zoster 05/27/2014   There are no preventive care reminders to display for this patient.  Patient Care Team: Leamon Arnt, MD as PCP - General (Family Medicine) [name], MD as Consulting Physician (Gastroenterology) [name] as Consulting Physician (Surgery) [name], MD as Consulting Physician (Orthopedic Surgery) [name], MD as Referring Physician (Allergy) [name], MD as Consulting Physician (Ophthalmology) [name], PsyD as Counselor (Psychology)  Indicate any recent Medical Services you may have received from other than [facility] providers in the past year (date may be approximate).     Assessment:   This is a routine wellness examination for [place].  Hearing/Vision screen No exam data present  Dietary issues and exercise activities discussed: Current Exercise Habits: The patient does not participate in regular exercise at present  Goals   None    Depression Screen PHQ 2/9 Scores 12/31/2019 11/23/2018  02/22/2018 12/12/2017 10/04/2017  PHQ - 2 Score 0 0 6 5 0  PHQ- 9 Score - - 13 13 -    Fall Risk Fall Risk  12/31/2019 12/31/2019 10/03/2019 06/14/2019 11/23/2018  Falls in the past year? 0 0 0 0 0  Number falls in past yr: 0 0 - 0 0  Injury with Fall? 0 0 - 0 0  Risk for fall due to : - - - History of fall(s) -  Follow up Falls evaluation completed;Education provided;Falls prevention discussed - Falls evaluation completed Falls evaluation completed Falls evaluation completed    Is the patient's home free of loose throw rugs in walkways, pet beds, electrical cords, etc?   yes      Grab bars in the bathroom? yes      Handrails on the stairs?   yes      Adequate lighting?   yes  Timed Get Up and Go Performed completed and within normal timeframe; no gait abnormalities noted   Cognitive Function: no cognitive concerns at this time    6CIT Screen 12/31/2019  What Year? 0 points  What month? 0 points  What time? 0 points  Count back from 20 0 points  Months in reverse 0 points  Repeat phrase 0 points  Total Score 0    Screening Tests Health Maintenance  Topic Date Due  . COVID-19 Vaccine (1) 01/16/2020 (Originally 01/29/1962)  . INFLUENZA VACCINE  03/23/2020  . MAMMOGRAM  10/09/2020  . COLONOSCOPY  12/22/2020  . DEXA SCAN  02/28/2023  . TETANUS/TDAP  06/16/2024  . Hepatitis C Screening  Completed  . PNA vac Low Risk Adult  Completed    Qualifies for Shingles Vaccine? Discussed and patient will check with pharmacy for coverage.  Patient education handout provided   Cancer Screenings: Lung: Low Dose CT Chest recommended if Age 41-80 years, 30 pack-year currently smoking OR have quit w/in 15years. Patient does not qualify. Breast: Up to date on Mammogram? Yes   Up to date of Bone Density/Dexa? Yes Colorectal: colonoscopy 12/23/15    Plan:  I have personally reviewed and addressed the Medicare Annual Wellness questionnaire and have noted the following in the patient's chart:   A. Medical and social history B. Use of alcohol, tobacco or illicit  drugs  C. Current medications and supplements D. Functional ability and status E.  Nutritional status F.  Physical activity G. Advance directives H. List of other physicians I.  Hospitalizations, surgeries, and ER visits in previous 12 months J.  [place] such as hearing and vision if needed, cognitive and depression L. Referrals, records requested, and appointments- none   In addition, I have reviewed and discussed with patient certain preventive protocols, quality metrics, and best practice recommendations. A written personalized care plan for preventive services as well as general preventive health recommendations were provided to patient.   Signed,  Denman George, LPN  Nurse Health Advisor   Nurse Notes: no additional

## 2020-01-01 LAB — COMPREHENSIVE METABOLIC PANEL
ALT: 13 U/L (ref 0–35)
AST: 11 U/L (ref 0–37)
Albumin: 4 g/dL (ref 3.5–5.2)
Alkaline Phosphatase: 62 U/L (ref 39–117)
BUN: 25 mg/dL — ABNORMAL HIGH (ref 6–23)
CO2: 28 mEq/L (ref 19–32)
Calcium: 9.2 mg/dL (ref 8.4–10.5)
Chloride: 101 mEq/L (ref 96–112)
Creatinine, Ser: 1.13 mg/dL (ref 0.40–1.20)
GFR: 47.34 mL/min — ABNORMAL LOW (ref >60.00)
Glucose, Bld: 104 mg/dL — ABNORMAL HIGH (ref 70–99)
Potassium: 3.9 mEq/L (ref 3.5–5.1)
Sodium: 139 mEq/L (ref 135–145)
Total Bilirubin: 0.8 mg/dL (ref 0.2–1.2)
Total Protein: 6.3 g/dL (ref 6.0–8.3)

## 2020-01-08 ENCOUNTER — Encounter: Payer: Self-pay | Admitting: Family Medicine

## 2020-01-08 NOTE — Progress Notes (Signed)
I have reviewed the documented AWV and agree with the findings as documented by our nurse educator.  I was available for consultation during the visit. I will follow up with patient on any outstanding screens that are needed, results or new concerns that were found on this documented screening visit.   

## 2020-01-16 ENCOUNTER — Encounter (INDEPENDENT_AMBULATORY_CARE_PROVIDER_SITE_OTHER): Payer: Self-pay | Admitting: Family Medicine

## 2020-01-16 ENCOUNTER — Ambulatory Visit (INDEPENDENT_AMBULATORY_CARE_PROVIDER_SITE_OTHER): Payer: Medicare HMO | Admitting: Family Medicine

## 2020-01-16 ENCOUNTER — Other Ambulatory Visit: Payer: Self-pay

## 2020-01-16 VITALS — BP 97/66 | HR 76 | Temp 98.0°F | Ht 66.0 in | Wt 243.0 lb

## 2020-01-16 DIAGNOSIS — N1831 Chronic kidney disease, stage 3a: Secondary | ICD-10-CM | POA: Diagnosis not present

## 2020-01-16 DIAGNOSIS — Z6839 Body mass index (BMI) 39.0-39.9, adult: Secondary | ICD-10-CM

## 2020-01-16 DIAGNOSIS — E559 Vitamin D deficiency, unspecified: Secondary | ICD-10-CM

## 2020-01-16 MED ORDER — VITAMIN D (ERGOCALCIFEROL) 1.25 MG (50000 UNIT) PO CAPS: 50000.0000 [IU] | ORAL_CAPSULE | ORAL | 0 refills | Status: DC

## 2020-01-22 ENCOUNTER — Other Ambulatory Visit: Payer: Self-pay | Admitting: Family Medicine

## 2020-01-28 DIAGNOSIS — H35033 Hypertensive retinopathy, bilateral: Secondary | ICD-10-CM | POA: Diagnosis not present

## 2020-01-28 DIAGNOSIS — H2513 Age-related nuclear cataract, bilateral: Secondary | ICD-10-CM | POA: Diagnosis not present

## 2020-01-28 DIAGNOSIS — H16223 Keratoconjunctivitis sicca, not specified as Sjogren's, bilateral: Secondary | ICD-10-CM | POA: Diagnosis not present

## 2020-01-28 DIAGNOSIS — H10503 Unspecified blepharoconjunctivitis, bilateral: Secondary | ICD-10-CM | POA: Diagnosis not present

## 2020-02-11 ENCOUNTER — Other Ambulatory Visit (INDEPENDENT_AMBULATORY_CARE_PROVIDER_SITE_OTHER): Payer: Self-pay | Admitting: Family Medicine

## 2020-02-11 DIAGNOSIS — E559 Vitamin D deficiency, unspecified: Secondary | ICD-10-CM

## 2020-02-13 ENCOUNTER — Ambulatory Visit (INDEPENDENT_AMBULATORY_CARE_PROVIDER_SITE_OTHER): Payer: Medicare HMO | Admitting: Family Medicine

## 2020-03-05 ENCOUNTER — Other Ambulatory Visit: Payer: Self-pay

## 2020-03-05 ENCOUNTER — Ambulatory Visit (INDEPENDENT_AMBULATORY_CARE_PROVIDER_SITE_OTHER): Payer: Medicare HMO | Admitting: Family Medicine

## 2020-03-05 ENCOUNTER — Encounter (INDEPENDENT_AMBULATORY_CARE_PROVIDER_SITE_OTHER): Payer: Self-pay | Admitting: Family Medicine

## 2020-03-05 VITALS — BP 144/68 | HR 78 | Temp 98.4°F | Ht 66.0 in | Wt 240.0 lb

## 2020-03-05 DIAGNOSIS — E559 Vitamin D deficiency, unspecified: Secondary | ICD-10-CM

## 2020-03-05 DIAGNOSIS — Z6838 Body mass index (BMI) 38.0-38.9, adult: Secondary | ICD-10-CM

## 2020-03-05 MED ORDER — VITAMIN D (ERGOCALCIFEROL) 1.25 MG (50000 UNIT) PO CAPS: 50000.0000 [IU] | ORAL_CAPSULE | ORAL | 0 refills | Status: DC

## 2020-03-05 NOTE — Progress Notes (Signed)
Chief Complaint:   OBESITY Maria Marquez is here to discuss her progress with her obesity treatment plan along with follow-up of her obesity related diagnoses. Maria Marquez is on the Category 2 Plan and states she is following her eating plan approximately 70% of the time. Maria Marquez states she is bike riding for 11 minutes 3-4 times per week.  Today's visit was #: 72 Starting weight: 244 lbs Starting date: 02/22/2018 Today's weight: 243 lbs Today's date: 01/16/2020 Total lbs lost to date: 1 Total lbs lost since last in-office visit: 0  Interim History: Maria Marquez continues to struggles with weight loss and she has gained back almost all of her weight she lost before the pandemic. She is trying to make healthier choices overall. I suspect her RMR has decreased due to lower protein intake. She has been trying to eat Keto which is high fat and is likely too many calories for her.  Subjective:   1. Vitamin D deficiency Maria Marquez is stable on Vit D, and she requests a refill today. She denies nausea, vomiting, or muscle weakness.  2. Chronic renal impairment, stage 3a Maria Marquez is distressed by her renal insufficiency and how close she got last year to stage 4 kidney disease. She notes increased edema in her feet bilateral, and she would like a referral to Nephrology.  Assessment/Plan:   1. Vitamin D deficiency Low Vitamin D level contributes to fatigue and are associated with obesity, breast, and colon cancer. We will refill prescription Vitamin D for 1 month. Maria Marquez will follow-up for routine testing of Vitamin D, at least 2-3 times per year to avoid over-replacement.  - Vitamin D, Ergocalciferol, (DRISDOL) 1.25 MG (50000 UNIT) CAPS capsule; Take 1 capsule (50,000 Units total) by mouth every 7 (seven) days.  Dispense: 4 capsule; Refill: 0  2. Chronic renal impairment, stage 3a Rodneshia was strongly encouraged to get back to a structured eating plan to help with weight loss and renal condition.  3. Class 2 severe  obesity with serious comorbidity and body mass index (BMI) of 39.0 to 39.9 in adult, unspecified obesity type (HCC) Maria Marquez is currently in the action stage of change. As such, her goal is to continue with weight loss efforts. She has agreed to change to keeping a food journal and adhering to recommended goals of 1000-1200 calories and 75+ grams of protein daily.   Exercise goals: As is.  Behavioral modification strategies: increasing lean protein intake, decreasing simple carbohydrates, increasing water intake and decreasing sodium intake.  Maria Marquez has agreed to follow-up with our clinic in 3 to 4 weeks. She was informed of the importance of frequent follow-up visits to maximize her success with intensive lifestyle modifications for her multiple health conditions.   Objective:   Blood pressure 97/66, pulse 76, temperature 98 F (36.7 C), temperature source Oral, height 5\' 6"  (1.676 m), weight 243 lb (110.2 kg), SpO2 98 %. Body mass index is 39.22 kg/m.  General: Cooperative, alert, well developed, in no acute distress. HEENT: Conjunctivae and lids unremarkable. Cardiovascular: Regular rhythm.  Lungs: Normal work of breathing. Neurologic: No focal deficits.   Lab Results  Component Value Date   CREATININE 1.13 12/31/2019   BUN 25 (H) 12/31/2019   NA 139 12/31/2019   K 3.9 12/31/2019   CL 101 12/31/2019   CO2 28 12/31/2019   Lab Results  Component Value Date   ALT 13 12/31/2019   AST 11 12/31/2019   ALKPHOS 62 12/31/2019   BILITOT 0.8 12/31/2019   Lab  Results  Component Value Date   HGBA1C 6.1 (H) 12/19/2019   HGBA1C 5.7 (H) 11/01/2018   HGBA1C 5.8 (H) 06/13/2018   HGBA1C 6.4 01/17/2018   HGBA1C 6.1 09/23/2015   Lab Results  Component Value Date   INSULIN 15.4 12/19/2019   INSULIN 11.7 11/01/2018   INSULIN 15.0 06/13/2018   INSULIN 14.0 02/22/2018   Lab Results  Component Value Date   TSH 2.81 06/14/2019   Lab Results  Component Value Date   CHOL 204 (H)  12/19/2019   HDL 42 12/19/2019   LDLCALC 127 (H) 12/19/2019   LDLDIRECT 197.0 01/13/2018   TRIG 196 (H) 12/19/2019   CHOLHDL 5 06/14/2019   Lab Results  Component Value Date   WBC 8.3 06/14/2019   HGB 14.3 06/14/2019   HCT 42.3 06/14/2019   MCV 95.0 06/14/2019   PLT 247.0 06/14/2019   No results found for: IRON, TIBC, FERRITIN  Obesity Behavioral Intervention Documentation for Insurance:   Approximately 15 minutes were spent on the discussion below.  ASK: We discussed the diagnosis of obesity with Maria Marquez today and Maria Marquez agreed to give [name] permission to discuss obesity behavioral modification therapy today.  ASSESS: [name] has the diagnosis of obesity and her BMI today is 39.24. Maria Marquez is in the action stage of change.   ADVISE: Maria Marquez was educated on the multiple health risks of obesity as well as the benefit of weight loss to improve her health. She was advised of the need for long term treatment and the importance of lifestyle modifications to improve her current health and to decrease her risk of future health problems.  AGREE: Multiple dietary modification options and treatment options were discussed and Maria Marquez agreed to follow the recommendations documented in the above note.  ARRANGE: Maria Marquez was educated on the importance of frequent visits to treat obesity as outlined per CMS and USPSTF guidelines and agreed to schedule her next follow up appointment today.  Attestation Statements:   Reviewed by clinician on day of visit: allergies, medications, problem list, medical history, surgical history, family history, social history, and previous encounter notes.   I, Trixie Dredge, am acting as transcriptionist for Maria Nip, MD.  I have reviewed the above documentation for accuracy and completeness, and I agree with the above. -  Maria Nip, MD

## 2020-03-06 ENCOUNTER — Encounter (INDEPENDENT_AMBULATORY_CARE_PROVIDER_SITE_OTHER): Payer: Self-pay | Admitting: Family Medicine

## 2020-03-06 NOTE — Progress Notes (Signed)
Chief Complaint:   OBESITY Maria Marquez is here to discuss her progress with her obesity treatment plan along with follow-up of her obesity related diagnoses. Maria Marquez is on keeping a food journal and adhering to recommended goals of 1000-1200 calories and 75+ grams of protein daily and states she is following her eating plan approximately 75% of the time. Maria Marquez states she is riding the stationary bike for 10 minutes 3 times per week.  Today's visit was #: 53 Starting weight: 244 lbs Starting date: 02/22/2018 Today's weight: 240 lbs Today's date: 03/05/2020 Total lbs lost to date: 4 Total lbs lost since last in-office visit: 3  Interim History: Maria Marquez has done better with weight loss since her last visit. Her hunger is mostly controlled.  Subjective:   1. Vitamin D deficiency Maria Marquez is on Vit D, and she denies nausea or vomiting but her level is not yet at goal.  Assessment/Plan:   1. Vitamin D deficiency Low Vitamin D level contributes to fatigue and are associated with obesity, breast, and colon cancer. We will refill prescription Vitamin D for 1 month. Maria Marquez will follow-up for routine testing of Vitamin D, at least 2-3 times per year to avoid over-replacement.  - Vitamin D, Ergocalciferol, (DRISDOL) 1.25 MG (50000 UNIT) CAPS capsule; Take 1 capsule (50,000 Units total) by mouth every 7 (seven) days.  Dispense: 4 capsule; Refill: 0  2. Class 2 severe obesity with serious comorbidity and body mass index (BMI) of 38.0 to 38.9 in adult, unspecified obesity type (HCC) Maria Marquez is currently in the action stage of change. As such, her goal is to continue with weight loss efforts. She has agreed to the Category 2 Plan.   Exercise goals: As is.  Behavioral modification strategies: increasing water intake.  Maria Marquez has agreed to follow-up with our clinic in 3 to 4 weeks. She was informed of the importance of frequent follow-up visits to maximize her success with intensive lifestyle modifications for  her multiple health conditions.   Objective:   Blood pressure (!) 144/68, pulse 78, temperature 98.4 F (36.9 C), temperature source Oral, height 5\' 6"  (1.676 m), weight 240 lb (108.9 kg), SpO2 98 %. Body mass index is 38.74 kg/m.  General: Cooperative, alert, well developed, in no acute distress. HEENT: Conjunctivae and lids unremarkable. Cardiovascular: Regular rhythm.  Lungs: Normal work of breathing. Neurologic: No focal deficits.   Lab Results  Component Value Date   CREATININE 1.13 12/31/2019   BUN 25 (H) 12/31/2019   NA 139 12/31/2019   K 3.9 12/31/2019   CL 101 12/31/2019   CO2 28 12/31/2019   Lab Results  Component Value Date   ALT 13 12/31/2019   AST 11 12/31/2019   ALKPHOS 62 12/31/2019   BILITOT 0.8 12/31/2019   Lab Results  Component Value Date   HGBA1C 6.1 (H) 12/19/2019   HGBA1C 5.7 (H) 11/01/2018   HGBA1C 5.8 (H) 06/13/2018   HGBA1C 6.4 01/17/2018   HGBA1C 6.1 09/23/2015   Lab Results  Component Value Date   INSULIN 15.4 12/19/2019   INSULIN 11.7 11/01/2018   INSULIN 15.0 06/13/2018   INSULIN 14.0 02/22/2018   Lab Results  Component Value Date   TSH 2.81 06/14/2019   Lab Results  Component Value Date   CHOL 204 (H) 12/19/2019   HDL 42 12/19/2019   LDLCALC 127 (H) 12/19/2019   LDLDIRECT 197.0 01/13/2018   TRIG 196 (H) 12/19/2019   CHOLHDL 5 06/14/2019   Lab Results  Component Value Date  WBC 8.3 06/14/2019   HGB 14.3 06/14/2019   HCT 42.3 06/14/2019   MCV 95.0 06/14/2019   PLT 247.0 06/14/2019   No results found for: IRON, TIBC, FERRITIN  Obesity Behavioral Intervention Documentation for Insurance:   Approximately 15 minutes were spent on the discussion below.  ASK: We discussed the diagnosis of obesity with Maria Marquez today and Maria Marquez agreed to give [name] permission to discuss obesity behavioral modification therapy today.  ASSESS: Maria Marquez has the diagnosis of obesity and her BMI today is 38.76. Maria Marquez is in the action stage of change.    ADVISE: Maria Marquez was educated on the multiple health risks of obesity as well as the benefit of weight loss to improve her health. She was advised of the need for long term treatment and the importance of lifestyle modifications to improve her current health and to decrease her risk of future health problems.  AGREE: Multiple dietary modification options and treatment options were discussed and Maria Marquez agreed to follow the recommendations documented in the above note.  ARRANGE: Maria Marquez was educated on the importance of frequent visits to treat obesity as outlined per CMS and USPSTF guidelines and agreed to schedule her next follow up appointment today.  Attestation Statements:   Reviewed by clinician on day of visit: allergies, medications, problem list, medical history, surgical history, family history, social history, and previous encounter notes.   I, Trixie Dredge, am acting as transcriptionist for [name], MD.  I have reviewed the above documentation for accuracy and completeness, and I agree with the above. -  [name], MD

## 2020-03-10 ENCOUNTER — Other Ambulatory Visit: Payer: Self-pay

## 2020-03-10 ENCOUNTER — Encounter (INDEPENDENT_AMBULATORY_CARE_PROVIDER_SITE_OTHER): Payer: Self-pay | Admitting: Family Medicine

## 2020-03-10 ENCOUNTER — Encounter: Payer: Self-pay | Admitting: Physician Assistant

## 2020-03-10 ENCOUNTER — Encounter: Payer: Self-pay | Admitting: Family Medicine

## 2020-03-10 ENCOUNTER — Ambulatory Visit (INDEPENDENT_AMBULATORY_CARE_PROVIDER_SITE_OTHER): Payer: Medicare HMO | Admitting: Physician Assistant

## 2020-03-10 VITALS — BP 140/80 | HR 72 | Temp 98.0°F | Ht 66.0 in | Wt 245.4 lb

## 2020-03-10 DIAGNOSIS — R5381 Other malaise: Secondary | ICD-10-CM | POA: Diagnosis not present

## 2020-03-10 DIAGNOSIS — R319 Hematuria, unspecified: Secondary | ICD-10-CM | POA: Diagnosis not present

## 2020-03-10 DIAGNOSIS — N1831 Chronic kidney disease, stage 3a: Secondary | ICD-10-CM | POA: Diagnosis not present

## 2020-03-10 LAB — POCT URINALYSIS DIPSTICK
Bilirubin, UA: NEGATIVE
Glucose, UA: NEGATIVE
Ketones, UA: NEGATIVE
Nitrite, UA: NEGATIVE
Protein, UA: POSITIVE — AB
Spec Grav, UA: 1.015 (ref 1.010–1.025)
Urobilinogen, UA: 1 E.U./dL
pH, UA: 6 (ref 5.0–8.0)

## 2020-03-10 MED ORDER — CEPHALEXIN 500 MG PO CAPS: 500.0000 mg | ORAL_CAPSULE | Freq: Three times a day (TID) | ORAL | 0 refills | Status: AC

## 2020-03-10 NOTE — Progress Notes (Signed)
Maria Marquez is a 74 y.o. female here for a new problem.  I acted as a Education administrator for [employer], PA-C Anselmo Pickler, LPN   History of Present Illness:   Chief Complaint  Patient presents with  . Hematuria  . Urinary Frequency  . Back Pain    HPI   Hematuria History of benign microscopic hematuria and CKD stage 3a, most recent GFR 47 in May 2021. Patient c/o urinary frequency and blood in urine, this started on Friday. Initially her urine had a rusty color and then she developed bright red in toilet bowl. She has some low back pain. Has mild headache yesterday and today took Tylenol with relief.  Drinks at least 20-30 oz water daily. She is followed by [facility] and is trying to consume 80 gm protein daily on their dietary program.   Also endorses recent increase in blood pressure (compared to baseline), malaise and bilateral LE swelling.    BP Readings from Last 3 Encounters:  03/10/20 140/80  03/05/20 (!) 144/68  01/16/20 97/66   Current smoker.   Past Medical History:  Diagnosis Date  . Acute blood loss anemia   . Arthritis   . Back pain   . Bilious vomiting with nausea   . Chronic kidney disease, stage 3a 12/31/2019  . Constipation   . Gastroesophageal reflux disease without esophagitis 06/17/2015  . GERD (gastroesophageal reflux disease)   . HLD (hyperlipidemia)   . Hypertension   . Hypothyroid   . Joint pain   . Leg edema   . Leukocytosis   . Multiple food allergies   . Myalgia due to statin 11/23/2018  . Osteoarthritis of hip    Right  . Postmenopausal hormone replacement therapy 06/17/2015  . Prediabetes   . Sessile colonic polyp 10/04/2017   Colonoscopy 12/2015, Dr. Fuller Plan, repeat q 5 years.     Social History   Tobacco Use  . Smoking status: Current Every Day Smoker    Packs/day: 0.25    Types: Cigarettes  . Smokeless tobacco: Current User  Vaping Use  . Vaping Use: Never used  Substance Use Topics  . Alcohol use: Yes    Comment: rarely  . Drug  use: No    Past Surgical History:  Procedure Laterality Date  . EYE SURGERY  1987  . HERNIA REPAIR    . THYROID LOBECTOMY     right side  . TOTAL HIP ARTHROPLASTY Right 01/28/2016   Procedure: RIGHT TOTAL HIP ARTHROPLASTY ANTERIOR APPROACH;  Surgeon: [name], MD;  Location: [facility];  Service: Orthopedics;  Laterality: Right;    Family History  Problem Relation Age of Onset  . Arthritis Mother   . Breast cancer Mother   . Schizophrenia Mother   . Diabetes Father   . Heart disease Father   . Kidney disease Father   . Stroke Sister   . Lung cancer Brother   . Heart disease Brother   . Lung cancer Brother   . Heart disease Brother   . Vision loss Brother        Glaucoma  . Alcohol abuse Brother     Allergies  Allergen Reactions  . Lasix [Furosemide] Itching  . Rosuvastatin Other (See Comments)    Muscle Pain in Thighs  . Shellfish Allergy Hives  . Sulfa Antibiotics Nausea Only  . Latex Rash  . Lisinopril Cough  . Tramadol Itching    Current Medications:   Current Outpatient Medications:  .  albuterol (VENTOLIN HFA) 108 (  90 Base) MCG/ACT inhaler, Inhale 1-2 puffs into the lungs every 6 (six) hours as needed for wheezing or shortness of breath., Disp: 18 g, Rfl: 0 .  aspirin 81 MG chewable tablet, Chew by mouth daily., Disp: , Rfl:  .  EPINEPHrine (EPIPEN 2-PAK) 0.3 mg/0.3 mL IJ SOAJ injection, as needed., Disp: , Rfl:  .  estradiol-norethindrone (ACTIVELLA) 1-0.5 MG tablet, Take 1 tablet by mouth daily., Disp: 84 tablet, Rfl: 3 .  ezetimibe (ZETIA) 10 MG tablet, TAKE 1 TABLET EVERY DAY, Disp: 90 tablet, Rfl: 3 .  levothyroxine (SYNTHROID) 137 MCG tablet, TAKE 1 TABLET (137 MCG TOTAL) BY MOUTH AT BEDTIME., Disp: 90 tablet, Rfl: 3 .  losartan-hydrochlorothiazide (HYZAAR) 50-12.5 MG tablet, Take 1 tablet by mouth daily., Disp: 90 tablet, Rfl: 2 .  simvastatin (ZOCOR) 10 MG tablet, TAKE 1 TABLET (10 MG TOTAL) BY MOUTH 2 (TWO) TIMES A WEEK., Disp: 26 tablet, Rfl: 3 .   Vitamin D, Ergocalciferol, (DRISDOL) 1.25 MG (50000 UNIT) CAPS capsule, Take 1 capsule (50,000 Units total) by mouth every 7 (seven) days., Disp: 4 capsule, Rfl: 0 .  XIIDRA 5 % SOLN, Place 1 drop into both eyes as needed., Disp: , Rfl:  .  cephALEXin (KEFLEX) 500 MG capsule, Take 1 capsule (500 mg total) by mouth 3 (three) times daily for 5 days., Disp: 15 capsule, Rfl: 0   Review of Systems:   ROS Negative unless otherwise specified per HPI.  Vitals:   Vitals:   03/10/20 1540  BP: 140/80  Pulse: 72  Temp: 98 F (36.7 C)  TempSrc: Temporal  SpO2: 99%  Weight: 245 lb 6.1 oz (111.3 kg)  Height: 5\' 6"  (1.676 m)     Body mass index is 39.61 kg/m.  Physical Exam:   Physical Exam Vitals and nursing note reviewed.  Constitutional:      General: She is not in acute distress.    Appearance: She is well-developed. She is not ill-appearing or toxic-appearing.  Cardiovascular:     Rate and Rhythm: Normal rate and regular rhythm.     Pulses: Normal pulses.     Heart sounds: Normal heart sounds, S1 normal and S2 normal.  Pulmonary:     Effort: Pulmonary effort is normal.     Breath sounds: Normal breath sounds.  Musculoskeletal:     Right lower leg: 1+ Edema present.     Left lower leg: 1+ Edema present.  Skin:    General: Skin is warm and dry.  Neurological:     Mental Status: She is alert.     GCS: GCS eye subscore is 4. GCS verbal subscore is 5. GCS motor subscore is 6.  Psychiatric:        Speech: Speech normal.        Behavior: Behavior normal. Behavior is cooperative.     Results for orders placed or performed in visit on 03/10/20  POCT urinalysis dipstick  Result Value Ref Range   Color, UA red    Clarity, UA cloudy    Glucose, UA Negative Negative   Bilirubin, UA negative    Ketones, UA negative    Spec Grav, UA 1.015 1.010 - 1.025   Blood, UA 3+    pH, UA 6.0 5.0 - 8.0   Protein, UA Positive (A) Negative   Urobilinogen, UA 1.0 0.2 or 1.0 E.U./dL    Nitrite, UA negative    Leukocytes, UA Small (1+) (A) Negative   Appearance     Odor  Assessment and Plan:   Dashanna was seen today for hematuria, urinary frequency and back pain.  Diagnoses and all orders for this visit:  Hematuria, unspecified type; Malaise; CKD No red flags on exam. Due to CKD and recent malaise, will update CBC and CMP, as well as order urine culture. Start oral keflex. Worsening precautions advised. If work-up is negative, and symptoms persist, will refer urgently to urology and/or obtain imaging. -     POCT urinalysis dipstick -     Urine Culture -     CBC with Differential/Platelet -     Comprehensive metabolic panel  Other orders -     cephALEXin (KEFLEX) 500 MG capsule; Take 1 capsule (500 mg total) by mouth 3 (three) times daily for 5 days.  . Reviewed expectations re: course of current medical issues. . Discussed self-management of symptoms. . Outlined signs and symptoms indicating need for more acute intervention. . Patient verbalized understanding and all questions were answered. . See orders for this visit as documented in the electronic medical record. . Patient received an After-Visit Summary.  CMA or LPN served as scribe during this visit. History, Physical, and Plan performed by medical provider. The above documentation has been reviewed and is accurate and complete.  Time spent with patient today was 25 minutes which consisted of chart review, discussing diagnosis, work up, treatment answering questions and documentation.  Inda Coke, PA-C

## 2020-03-10 NOTE — Patient Instructions (Signed)
It was great to see you!  Start oral keflex antibiotic to cover for infection. I will be in touch with your labs and urine culture result.  General instructions  Make sure you: ? Pee until your bladder is empty. ? Do not hold pee for a long time. ? Empty your bladder after sex. ? Wipe from front to back after pooping if you are a female. Use each tissue one time when you wipe.  Drink enough fluid to keep your pee pale yellow.  Keep all follow-up visits as told by your doctor. This is important. Contact a doctor if:  You do not get better after 1-2 days.  Your symptoms go away and then come back. Get help right away if:  You have very bad back pain.  You have very bad pain in your lower belly.  You have a fever.  You are sick to your stomach (nauseous).  You are throwing up.  Take care,  Inda Coke PA-C

## 2020-03-10 NOTE — Telephone Encounter (Signed)
Please see

## 2020-03-11 ENCOUNTER — Encounter: Payer: Self-pay | Admitting: Physician Assistant

## 2020-03-11 LAB — COMPREHENSIVE METABOLIC PANEL
ALT: 23 U/L (ref 0–35)
AST: 16 U/L (ref 0–37)
Albumin: 4.1 g/dL (ref 3.5–5.2)
Alkaline Phosphatase: 62 U/L (ref 39–117)
BUN: 23 mg/dL (ref 6–23)
CO2: 31 mEq/L (ref 19–32)
Calcium: 9 mg/dL (ref 8.4–10.5)
Chloride: 103 mEq/L (ref 96–112)
Creatinine, Ser: 1.39 mg/dL — ABNORMAL HIGH (ref 0.40–1.20)
GFR: 37.26 mL/min — ABNORMAL LOW (ref >60.00)
Glucose, Bld: 92 mg/dL (ref 70–99)
Potassium: 4.1 mEq/L (ref 3.5–5.1)
Sodium: 139 mEq/L (ref 135–145)
Total Bilirubin: 0.6 mg/dL (ref 0.2–1.2)
Total Protein: 6.8 g/dL (ref 6.0–8.3)

## 2020-03-11 LAB — CBC WITH DIFFERENTIAL/PLATELET
Basophils Absolute: 0.1 10*3/uL (ref 0.0–0.1)
Basophils Relative: 1.1 % (ref 0.0–3.0)
Eosinophils Absolute: 0.2 10*3/uL (ref 0.0–0.7)
Eosinophils Relative: 2.7 % (ref 0.0–5.0)
HCT: 41.8 % (ref 36.0–46.0)
Hemoglobin: 14.4 g/dL (ref 12.0–15.0)
Lymphocytes Relative: 23.7 % (ref 12.0–46.0)
Lymphs Abs: 2.1 10*3/uL (ref 0.7–4.0)
MCHC: 34.6 g/dL (ref 30.0–36.0)
MCV: 94.7 fl (ref 78.0–100.0)
Monocytes Absolute: 0.7 10*3/uL (ref 0.1–1.0)
Monocytes Relative: 7.8 % (ref 3.0–12.0)
Neutro Abs: 5.8 10*3/uL (ref 1.4–7.7)
Neutrophils Relative %: 64.7 % (ref 43.0–77.0)
Platelets: 234 10*3/uL (ref 150.0–400.0)
RBC: 4.41 Mil/uL (ref 3.87–5.11)
RDW: 13.8 % (ref 11.5–15.5)
WBC: 9 10*3/uL (ref 4.0–10.5)

## 2020-03-11 LAB — URINE CULTURE
MICRO NUMBER:: 10721214
SPECIMEN QUALITY:: ADEQUATE

## 2020-03-12 ENCOUNTER — Other Ambulatory Visit: Payer: Self-pay | Admitting: Physician Assistant

## 2020-03-12 DIAGNOSIS — R319 Hematuria, unspecified: Secondary | ICD-10-CM

## 2020-03-12 MED ORDER — ALLOPURINOL 100 MG PO TABS: 50.0000 mg | ORAL_TABLET | Freq: Every day | ORAL | 1 refills | Status: DC

## 2020-03-14 ENCOUNTER — Telehealth: Payer: Self-pay | Admitting: Family Medicine

## 2020-03-14 NOTE — Telephone Encounter (Signed)
Urgent referral was put in for pt to go to [facility]. Appt was made for 03/12/2020. Pt called and cancelled appt. Rescheduled for next week.

## 2020-03-18 DIAGNOSIS — R310 Gross hematuria: Secondary | ICD-10-CM | POA: Diagnosis not present

## 2020-03-18 DIAGNOSIS — N3001 Acute cystitis with hematuria: Secondary | ICD-10-CM | POA: Diagnosis not present

## 2020-03-18 DIAGNOSIS — R350 Frequency of micturition: Secondary | ICD-10-CM | POA: Diagnosis not present

## 2020-03-18 DIAGNOSIS — R351 Nocturia: Secondary | ICD-10-CM | POA: Diagnosis not present

## 2020-03-19 DIAGNOSIS — R310 Gross hematuria: Secondary | ICD-10-CM | POA: Diagnosis not present

## 2020-03-19 DIAGNOSIS — K8020 Calculus of gallbladder without cholecystitis without obstruction: Secondary | ICD-10-CM | POA: Diagnosis not present

## 2020-03-21 DIAGNOSIS — R350 Frequency of micturition: Secondary | ICD-10-CM | POA: Diagnosis not present

## 2020-03-21 DIAGNOSIS — R310 Gross hematuria: Secondary | ICD-10-CM | POA: Diagnosis not present

## 2020-03-23 DIAGNOSIS — C651 Malignant neoplasm of right renal pelvis: Secondary | ICD-10-CM

## 2020-03-23 HISTORY — DX: Malignant neoplasm of right renal pelvis: C65.1

## 2020-03-24 ENCOUNTER — Encounter: Payer: Self-pay | Admitting: Physician Assistant

## 2020-03-24 ENCOUNTER — Other Ambulatory Visit: Payer: Self-pay

## 2020-03-24 ENCOUNTER — Encounter: Payer: Self-pay | Admitting: Family Medicine

## 2020-03-26 ENCOUNTER — Ambulatory Visit: Payer: Medicare HMO | Admitting: Family Medicine

## 2020-03-26 ENCOUNTER — Other Ambulatory Visit (INDEPENDENT_AMBULATORY_CARE_PROVIDER_SITE_OTHER): Payer: Self-pay | Admitting: Family Medicine

## 2020-03-26 DIAGNOSIS — E559 Vitamin D deficiency, unspecified: Secondary | ICD-10-CM

## 2020-04-03 ENCOUNTER — Ambulatory Visit (INDEPENDENT_AMBULATORY_CARE_PROVIDER_SITE_OTHER): Payer: Medicare HMO | Admitting: Family Medicine

## 2020-04-03 DIAGNOSIS — R310 Gross hematuria: Secondary | ICD-10-CM | POA: Diagnosis not present

## 2020-04-03 DIAGNOSIS — D49511 Neoplasm of unspecified behavior of right kidney: Secondary | ICD-10-CM | POA: Diagnosis not present

## 2020-04-03 LAB — CBC AND DIFFERENTIAL
HCT: 41 (ref 36–46)
Hemoglobin: 13.5 (ref 12.0–16.0)
Platelets: 238 (ref 150–399)
WBC: 11.9

## 2020-04-03 LAB — CBC: RBC: 4.3 (ref 3.87–5.11)

## 2020-04-07 ENCOUNTER — Encounter (INDEPENDENT_AMBULATORY_CARE_PROVIDER_SITE_OTHER): Payer: Self-pay | Admitting: Family Medicine

## 2020-04-08 ENCOUNTER — Other Ambulatory Visit: Payer: Self-pay | Admitting: Urology

## 2020-04-14 NOTE — Patient Instructions (Addendum)
DUE TO COVID-19 ONLY ONE VISITOR IS ALLOWED TO COME WITH YOU AND STAY IN THE WAITING ROOM ONLY DURING PRE OP AND PROCEDURE DAY OF SURGERY. THE 1 VISITOR  MAY VISIT WITH YOU AFTER SURGERY IN YOUR PRIVATE ROOM DURING VISITING HOURS ONLY!  YOU NEED TO HAVE A COVID 19 TEST ON: 04/15/20 , THIS TEST MUST BE DONE BEFORE SURGERY,  COVID TESTING SITE [address] [place] [address], IT IS ON THE RIGHT GOING OUT [address] APPROXIMATELY  2 MINUTES PAST [employer] ON THE RIGHT. ONCE YOUR COVID TEST IS COMPLETED,  PLEASE BEGIN THE QUARANTINE INSTRUCTIONS AS OUTLINED IN YOUR HANDOUT.                Maria Marquez   Your procedure is scheduled on: 04/18/20   Report to [facility] Main  Entrance   Report to admitting at:  1:00 PM     Call this number if you have problems the morning of surgery [phone]    Remember: Do not eat solid food :After Midnight. Clear liquids from midnight until: 12:00 pm  CLEAR LIQUID DIET   Foods Allowed                                                                     Foods Excluded  Coffee and tea, regular and decaf                             liquids that you cannot  Plain Jell-O any favor except red or purple                                           see through such as: Fruit ices (not with fruit pulp)                                     milk, soups, orange juice  Iced Popsicles                                    All solid food Carbonated beverages, regular and diet                                    Cranberry, grape and apple juices Sports drinks like Gatorade Lightly seasoned clear broth or consume(fat free) Sugar, honey syrup  Sample Menu Breakfast                                Lunch                                     Supper Cranberry juice  Beef broth                            Chicken broth Jell-O                                     Grape juice                           Apple juice Coffee or tea                         Jell-O                                      Popsicle                                                Coffee or tea                        Coffee or tea  _____________________________________________________________________  BRUSH YOUR TEETH MORNING OF SURGERY AND RINSE YOUR MOUTH OUT, NO CHEWING GUM CANDY OR MINTS.     Take these medicines the morning of surgery with A SIP OF WATER: levothyroxine.                                You may not have any metal on your body including hair pins and              piercings  Do not wear jewelry, make-up, lotions, powders or perfumes, deodorant             Do not wear nail polish on your fingernails.  Do not shave  48 hours prior to surgery.             Do not bring valuables to the hospital. [place].  Contacts, dentures or bridgework may not be worn into surgery.  Leave suitcase in the car. After surgery it may be brought to your room.     Patients discharged the day of surgery will not be allowed to drive home. IF YOU ARE HAVING SURGERY AND GOING HOME THE SAME DAY, YOU MUST HAVE AN ADULT TO DRIVE YOU HOME AND BE WITH YOU FOR 24 HOURS. YOU MAY GO HOME BY TAXI OR UBER OR ORTHERWISE, BUT AN ADULT MUST ACCOMPANY YOU HOME AND STAY WITH YOU FOR 24 HOURS.  Name and phone number of your driver:  Special Instructions: N/A              Please read over the following fact sheets you were given: _____________________________________________________________________             [facility] - Preparing for Surgery Before surgery, you can play an important role.  Because skin is not sterile, your skin needs to be as free of germs as possible.  You can reduce the number of germs on your skin  by washing with CHG (chlorahexidine gluconate) soap before surgery.  CHG is an antiseptic cleaner which kills germs and bonds with the skin to continue killing germs even after washing. Please DO NOT use  if you have an allergy to CHG or antibacterial soaps.  If your skin becomes reddened/irritated stop using the CHG and inform your nurse when you arrive at Short Stay. Do not shave (including legs and underarms) for at least 48 hours prior to the first CHG shower.  You may shave your face/neck. Please follow these instructions carefully:  1.  Shower with CHG Soap the night before surgery and the  morning of Surgery.  2.  If you choose to wash your hair, wash your hair first as usual with your  normal  shampoo.  3.  After you shampoo, rinse your hair and body thoroughly to remove the  shampoo.                           4.  Use CHG as you would any other liquid soap.  You can apply chg directly  to the skin and wash                       Gently with a scrungie or clean washcloth.  5.  Apply the CHG Soap to your body ONLY FROM THE NECK DOWN.   Do not use on face/ open                           Wound or open sores. Avoid contact with eyes, ears mouth and genitals (private parts).                       Wash face,  Genitals (private parts) with your normal soap.             6.  Wash thoroughly, paying special attention to the area where your surgery  will be performed.  7.  Thoroughly rinse your body with warm water from the neck down.  8.  DO NOT shower/wash with your normal soap after using and rinsing off  the CHG Soap.                9.  Pat yourself dry with a clean towel.            10.  Wear clean pajamas.            11.  Place clean sheets on your bed the night of your first shower and do not  sleep with pets. Day of Surgery : Do not apply any lotions/deodorants the morning of surgery.  Please wear clean clothes to the hospital/surgery center.  FAILURE TO FOLLOW THESE INSTRUCTIONS MAY RESULT IN THE CANCELLATION OF YOUR SURGERY PATIENT SIGNATURE_________________________________  NURSE  SIGNATURE__________________________________  ________________________________________________________________________

## 2020-04-15 ENCOUNTER — Encounter (HOSPITAL_COMMUNITY): Payer: Self-pay

## 2020-04-15 ENCOUNTER — Encounter (HOSPITAL_COMMUNITY)
Admission: RE | Admit: 2020-04-15 | Discharge: 2020-04-15 | Disposition: A | Discharge status: Home / Self Care | Payer: Medicare HMO | Source: Ambulatory Visit | Attending: Urology | Admitting: Urology

## 2020-04-15 ENCOUNTER — Other Ambulatory Visit: Payer: Self-pay

## 2020-04-15 ENCOUNTER — Other Ambulatory Visit (HOSPITAL_COMMUNITY)
Admission: RE | Admit: 2020-04-15 | Discharge: 2020-04-15 | Disposition: A | Discharge status: Home / Self Care | Payer: Medicare HMO | Source: Ambulatory Visit | Attending: Urology | Admitting: Urology

## 2020-04-15 DIAGNOSIS — Z01818 Encounter for other preprocedural examination: Secondary | ICD-10-CM | POA: Insufficient documentation

## 2020-04-15 DIAGNOSIS — Z20822 Contact with and (suspected) exposure to covid-19: Secondary | ICD-10-CM | POA: Diagnosis not present

## 2020-04-15 HISTORY — DX: Prediabetes: R73.03

## 2020-04-15 HISTORY — DX: Dyspnea, unspecified: R06.00

## 2020-04-15 HISTORY — DX: Nausea with vomiting, unspecified: R11.2

## 2020-04-15 HISTORY — DX: Other complications of anesthesia, initial encounter: T88.59XA

## 2020-04-15 HISTORY — DX: Other specified postprocedural states: Z98.890

## 2020-04-15 HISTORY — DX: Malignant (primary) neoplasm, unspecified: C80.1

## 2020-04-15 HISTORY — DX: Pneumonia, unspecified organism: J18.9

## 2020-04-15 LAB — SARS CORONAVIRUS 2 (TAT 6-24 HRS): SARS Coronavirus 2: NEGATIVE

## 2020-04-15 LAB — CBC
HCT: 39.6 % (ref 36.0–46.0)
Hemoglobin: 13.1 g/dL (ref 12.0–15.0)
MCH: 31.6 pg (ref 26.0–34.0)
MCHC: 33.1 g/dL (ref 30.0–36.0)
MCV: 95.7 fL (ref 80.0–100.0)
Platelets: 229 10*3/uL (ref 150–400)
RBC: 4.14 MIL/uL (ref 3.87–5.11)
RDW: 13.3 % (ref 11.5–15.5)
WBC: 10.3 10*3/uL (ref 4.0–10.5)
nRBC: 0 % (ref 0.0–0.2)

## 2020-04-15 LAB — BASIC METABOLIC PANEL
Anion gap: 8 (ref 5–15)
BUN: 19 mg/dL (ref 8–23)
CO2: 28 mmol/L (ref 22–32)
Calcium: 9 mg/dL (ref 8.9–10.3)
Chloride: 102 mmol/L (ref 98–111)
Creatinine, Ser: 1.22 mg/dL — ABNORMAL HIGH (ref 0.44–1.00)
GFR calc Af Amer: 51 mL/min — ABNORMAL LOW (ref >60)
GFR calc non Af Amer: 44 mL/min — ABNORMAL LOW (ref >60)
Glucose, Bld: 107 mg/dL — ABNORMAL HIGH (ref 70–99)
Potassium: 4.1 mmol/L (ref 3.5–5.1)
Sodium: 138 mmol/L (ref 135–145)

## 2020-04-15 NOTE — Progress Notes (Signed)
COVID Vaccine Completed: NO Date COVID Vaccine completed: COVID vaccine manufacturer: [employer]   PCP - Dr. Billey Chang. LOV: 03/10/20 Cardiologist -   Chest x-ray - 04/13/20 EPIC EKG - 04/15/20 EPIC Stress Test -  ECHO -  Cardiac Cath -   Sleep Study -  CPAP -   Fasting Blood Sugar -  Checks Blood Sugar _____ times a day  Blood Thinner Instructions: Aspirin Instructions: On hold since 04/04/20 Last Dose:  Anesthesia review:   Patient denies shortness of breath, fever, cough and chest pain at PAT appointment   Patient verbalized understanding of instructions that were given to them at the PAT appointment. Patient was also instructed that they will need to review over the PAT instructions again at home before surgery.

## 2020-04-16 LAB — HEMOGLOBIN A1C
Hgb A1c MFr Bld: 6 % — ABNORMAL HIGH (ref 4.8–5.6)
Mean Plasma Glucose: 126 mg/dL

## 2020-04-18 ENCOUNTER — Encounter (HOSPITAL_COMMUNITY): Payer: Self-pay | Admitting: Urology

## 2020-04-18 ENCOUNTER — Other Ambulatory Visit: Payer: Self-pay

## 2020-04-18 ENCOUNTER — Ambulatory Visit (HOSPITAL_COMMUNITY): Payer: Medicare HMO | Admitting: Certified Registered"

## 2020-04-18 ENCOUNTER — Ambulatory Visit (HOSPITAL_COMMUNITY): Payer: Medicare HMO

## 2020-04-18 ENCOUNTER — Encounter (HOSPITAL_COMMUNITY)
Admission: RE | Disposition: A | Discharge status: Home / Self Care | Payer: Self-pay | Source: Ambulatory Visit | Attending: Urology

## 2020-04-18 ENCOUNTER — Ambulatory Visit (HOSPITAL_COMMUNITY)
Admission: RE | Admit: 2020-04-18 | Discharge: 2020-04-18 | Disposition: A | Discharge status: Home / Self Care | Payer: Medicare HMO | Source: Ambulatory Visit | Attending: Urology | Admitting: Urology

## 2020-04-18 DIAGNOSIS — I10 Essential (primary) hypertension: Secondary | ICD-10-CM | POA: Insufficient documentation

## 2020-04-18 DIAGNOSIS — E669 Obesity, unspecified: Secondary | ICD-10-CM | POA: Insufficient documentation

## 2020-04-18 DIAGNOSIS — Z79899 Other long term (current) drug therapy: Secondary | ICD-10-CM | POA: Insufficient documentation

## 2020-04-18 DIAGNOSIS — Z6838 Body mass index (BMI) 38.0-38.9, adult: Secondary | ICD-10-CM | POA: Diagnosis not present

## 2020-04-18 DIAGNOSIS — E785 Hyperlipidemia, unspecified: Secondary | ICD-10-CM | POA: Diagnosis not present

## 2020-04-18 DIAGNOSIS — K219 Gastro-esophageal reflux disease without esophagitis: Secondary | ICD-10-CM | POA: Diagnosis not present

## 2020-04-18 DIAGNOSIS — I700 Atherosclerosis of aorta: Secondary | ICD-10-CM | POA: Insufficient documentation

## 2020-04-18 DIAGNOSIS — I129 Hypertensive chronic kidney disease with stage 1 through stage 4 chronic kidney disease, or unspecified chronic kidney disease: Secondary | ICD-10-CM | POA: Diagnosis not present

## 2020-04-18 DIAGNOSIS — E7800 Pure hypercholesterolemia, unspecified: Secondary | ICD-10-CM | POA: Diagnosis not present

## 2020-04-18 DIAGNOSIS — E782 Mixed hyperlipidemia: Secondary | ICD-10-CM | POA: Diagnosis not present

## 2020-04-18 DIAGNOSIS — R310 Gross hematuria: Secondary | ICD-10-CM | POA: Diagnosis not present

## 2020-04-18 DIAGNOSIS — Z79890 Hormone replacement therapy (postmenopausal): Secondary | ICD-10-CM | POA: Diagnosis not present

## 2020-04-18 DIAGNOSIS — N289 Disorder of kidney and ureter, unspecified: Secondary | ICD-10-CM | POA: Diagnosis not present

## 2020-04-18 DIAGNOSIS — R1900 Intra-abdominal and pelvic swelling, mass and lump, unspecified site: Secondary | ICD-10-CM | POA: Diagnosis present

## 2020-04-18 DIAGNOSIS — N1831 Chronic kidney disease, stage 3a: Secondary | ICD-10-CM | POA: Insufficient documentation

## 2020-04-18 DIAGNOSIS — C661 Malignant neoplasm of right ureter: Secondary | ICD-10-CM | POA: Insufficient documentation

## 2020-04-18 DIAGNOSIS — F17210 Nicotine dependence, cigarettes, uncomplicated: Secondary | ICD-10-CM | POA: Diagnosis not present

## 2020-04-18 DIAGNOSIS — E039 Hypothyroidism, unspecified: Secondary | ICD-10-CM | POA: Diagnosis not present

## 2020-04-18 DIAGNOSIS — K8020 Calculus of gallbladder without cholecystitis without obstruction: Secondary | ICD-10-CM | POA: Diagnosis not present

## 2020-04-18 DIAGNOSIS — R7303 Prediabetes: Secondary | ICD-10-CM | POA: Insufficient documentation

## 2020-04-18 DIAGNOSIS — K449 Diaphragmatic hernia without obstruction or gangrene: Secondary | ICD-10-CM | POA: Diagnosis not present

## 2020-04-18 DIAGNOSIS — D4959 Neoplasm of unspecified behavior of other genitourinary organ: Secondary | ICD-10-CM | POA: Diagnosis not present

## 2020-04-18 DIAGNOSIS — D4111 Neoplasm of uncertain behavior of right renal pelvis: Secondary | ICD-10-CM | POA: Diagnosis not present

## 2020-04-18 DIAGNOSIS — Z7982 Long term (current) use of aspirin: Secondary | ICD-10-CM | POA: Diagnosis not present

## 2020-04-18 HISTORY — PX: CYSTOSCOPY WITH URETEROSCOPY: SHX5123

## 2020-04-18 SURGERY — CYSTOSCOPY WITH URETEROSCOPY
Anesthesia: General | Site: Ureter | Laterality: Right

## 2020-04-18 MED ORDER — OXYCODONE HCL 5 MG PO TABS: 5.0000 mg | ORAL_TABLET | Freq: Once | ORAL | Status: DC | PRN

## 2020-04-18 MED ORDER — KETOROLAC TROMETHAMINE 15 MG/ML IJ SOLN: 15.0000 mg | Freq: Once | INTRAMUSCULAR | Status: DC

## 2020-04-18 MED ORDER — PROPOFOL 10 MG/ML IV BOLUS
INTRAVENOUS | Status: AC
  Filled 2020-04-18: qty 20

## 2020-04-18 MED ORDER — ONDANSETRON HCL 4 MG PO TABS: 4.0000 mg | ORAL_TABLET | Freq: Every day | ORAL | 1 refills | Status: DC | PRN

## 2020-04-18 MED ORDER — ONDANSETRON HCL 4 MG/2ML IJ SOLN
INTRAMUSCULAR | Status: AC
  Filled 2020-04-18: qty 2

## 2020-04-18 MED ORDER — FENTANYL CITRATE (PF) 100 MCG/2ML IJ SOLN
INTRAMUSCULAR | Status: AC
  Filled 2020-04-18: qty 2

## 2020-04-18 MED ORDER — ACETAMINOPHEN 325 MG PO TABS: 325.0000 mg | ORAL_TABLET | ORAL | Status: DC | PRN

## 2020-04-18 MED ORDER — OXYBUTYNIN CHLORIDE 5 MG PO TABS: 5.0000 mg | ORAL_TABLET | Freq: Three times a day (TID) | ORAL | 1 refills | Status: DC | PRN

## 2020-04-18 MED ORDER — LIDOCAINE 2% (20 MG/ML) 5 ML SYRINGE
INTRAMUSCULAR | Status: DC | PRN
  Administered 2020-04-18: 100 mg via INTRAVENOUS

## 2020-04-18 MED ORDER — CEFAZOLIN SODIUM-DEXTROSE 2-4 GM/100ML-% IV SOLN
2.0000 g | Freq: Once | INTRAVENOUS | Status: AC
  Administered 2020-04-18: 2 g via INTRAVENOUS
  Filled 2020-04-18: qty 100

## 2020-04-18 MED ORDER — ACETAMINOPHEN 160 MG/5ML PO SOLN: 325.0000 mg | ORAL | Status: DC | PRN

## 2020-04-18 MED ORDER — FENTANYL CITRATE (PF) 100 MCG/2ML IJ SOLN: 25.0000 ug | INTRAMUSCULAR | Status: DC | PRN

## 2020-04-18 MED ORDER — ONDANSETRON HCL 4 MG/2ML IJ SOLN: 4.0000 mg | Freq: Once | INTRAMUSCULAR | Status: DC | PRN

## 2020-04-18 MED ORDER — CHLORHEXIDINE GLUCONATE 0.12 % MT SOLN
15.0000 mL | Freq: Once | OROMUCOSAL | Status: AC
  Administered 2020-04-18: 15 mL via OROMUCOSAL

## 2020-04-18 MED ORDER — ONDANSETRON HCL 4 MG/2ML IJ SOLN
INTRAMUSCULAR | Status: DC | PRN
  Administered 2020-04-18: 4 mg via INTRAVENOUS

## 2020-04-18 MED ORDER — ORAL CARE MOUTH RINSE: 15.0000 mL | Freq: Once | OROMUCOSAL | Status: AC

## 2020-04-18 MED ORDER — DEXAMETHASONE SODIUM PHOSPHATE 10 MG/ML IJ SOLN
INTRAMUSCULAR | Status: DC | PRN
  Administered 2020-04-18: 5 mg via INTRAVENOUS

## 2020-04-18 MED ORDER — MIDAZOLAM HCL 2 MG/2ML IJ SOLN
INTRAMUSCULAR | Status: AC
  Filled 2020-04-18: qty 2

## 2020-04-18 MED ORDER — PROPOFOL 10 MG/ML IV BOLUS
INTRAVENOUS | Status: DC | PRN
  Administered 2020-04-18: 200 mg via INTRAVENOUS

## 2020-04-18 MED ORDER — FENTANYL CITRATE (PF) 100 MCG/2ML IJ SOLN
INTRAMUSCULAR | Status: DC | PRN
  Administered 2020-04-18: 100 ug via INTRAVENOUS

## 2020-04-18 MED ORDER — MIDAZOLAM HCL 2 MG/2ML IJ SOLN
INTRAMUSCULAR | Status: DC | PRN
  Administered 2020-04-18: 2 mg via INTRAVENOUS

## 2020-04-18 MED ORDER — ALBUTEROL SULFATE HFA 108 (90 BASE) MCG/ACT IN AERS
INHALATION_SPRAY | RESPIRATORY_TRACT | Status: AC
  Filled 2020-04-18: qty 6.7

## 2020-04-18 MED ORDER — ALBUTEROL SULFATE HFA 108 (90 BASE) MCG/ACT IN AERS
INHALATION_SPRAY | RESPIRATORY_TRACT | Status: DC | PRN
  Administered 2020-04-18 (×2): 2 via RESPIRATORY_TRACT

## 2020-04-18 MED ORDER — SODIUM CHLORIDE 0.9 % IR SOLN
Status: DC | PRN
  Administered 2020-04-18: 3000 mL

## 2020-04-18 MED ORDER — EPHEDRINE SULFATE-NACL 50-0.9 MG/10ML-% IV SOSY
PREFILLED_SYRINGE | INTRAVENOUS | Status: DC | PRN
  Administered 2020-04-18: 20 mg via INTRAVENOUS
  Administered 2020-04-18 (×3): 10 mg via INTRAVENOUS

## 2020-04-18 MED ORDER — MEPERIDINE HCL 50 MG/ML IJ SOLN: 6.2500 mg | INTRAMUSCULAR | Status: DC | PRN

## 2020-04-18 MED ORDER — LIDOCAINE 2% (20 MG/ML) 5 ML SYRINGE
INTRAMUSCULAR | Status: AC
  Filled 2020-04-18: qty 5

## 2020-04-18 MED ORDER — DEXAMETHASONE SODIUM PHOSPHATE 10 MG/ML IJ SOLN
INTRAMUSCULAR | Status: AC
  Filled 2020-04-18: qty 1

## 2020-04-18 MED ORDER — LACTATED RINGERS IV SOLN: INTRAVENOUS | Status: DC

## 2020-04-18 MED ORDER — IOHEXOL 300 MG/ML  SOLN
INTRAMUSCULAR | Status: DC | PRN
  Administered 2020-04-18: 20 mL via URETHRAL

## 2020-04-18 MED ORDER — PHENAZOPYRIDINE HCL 200 MG PO TABS: 200.0000 mg | ORAL_TABLET | Freq: Three times a day (TID) | ORAL | 0 refills | Status: DC | PRN

## 2020-04-18 MED ORDER — OXYCODONE HCL 5 MG/5ML PO SOLN: 5.0000 mg | Freq: Once | ORAL | Status: DC | PRN

## 2020-04-18 MED ORDER — CEPHALEXIN 500 MG PO CAPS: 500.0000 mg | ORAL_CAPSULE | Freq: Two times a day (BID) | ORAL | 0 refills | Status: AC

## 2020-04-18 SURGICAL SUPPLY — 20 items
BAG URO CATCHER STRL LF (MISCELLANEOUS) ×2 IMPLANT
BASKET ZERO TIP NITINOL 2.4FR (BASKET) IMPLANT
BRUSH URET BIOPSY 3F (UROLOGICAL SUPPLIES) ×1 IMPLANT
BSKT STON RTRVL ZERO TP 2.4FR (BASKET)
CATH URET 5FR 28IN OPEN ENDED (CATHETERS) ×2 IMPLANT
CLOTH BEACON ORANGE TIMEOUT ST (SAFETY) ×2 IMPLANT
EXTRACTOR STONE NITINOL NGAGE (UROLOGICAL SUPPLIES) IMPLANT
GLOVE BIOGEL M STRL SZ7.5 (GLOVE) ×2 IMPLANT
GOWN STRL REUS W/TWL XL LVL3 (GOWN DISPOSABLE) ×2 IMPLANT
GUIDEWIRE ZIPWRE .038 STRAIGHT (WIRE) ×2 IMPLANT
KIT TURNOVER KIT A (KITS) IMPLANT
LASER FIB FLEXIVA PULSE ID 365 (Laser) IMPLANT
MANIFOLD NEPTUNE II (INSTRUMENTS) ×2 IMPLANT
PACK CYSTO (CUSTOM PROCEDURE TRAY) ×2 IMPLANT
SHEATH URETERAL 12FRX35CM (MISCELLANEOUS) IMPLANT
STENT URET 6FRX26 CONTOUR (STENTS) IMPLANT
TRACTIP FLEXIVA PULS ID 200XHI (Laser) IMPLANT
TRACTIP FLEXIVA PULSE ID 200 (Laser)
TUBING CONNECTING 10 (TUBING) ×2 IMPLANT
TUBING UROLOGY SET (TUBING) ×2 IMPLANT

## 2020-04-18 NOTE — Op Note (Signed)
Operative Note  Preoperative diagnosis:  1.  Right renal pelvis mass  Postoperative diagnosis: 1.  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  Cystoscopy with right ureteroscopy and biopsy of right renal pelvis mass 2.  Right retrograde pyelogram with intraoperative interpretation fluoroscopic imaging  Surgeon: Ellison Hughs, MD  Assistants:  None  Anesthesia:  General  Complications:  None  EBL: Less than 5 mL  Specimens: 1.  Right renal pelvis washings 2.  Brush biopsy of right renal pelvis mass  Drains/Catheters: 1.  None  Intraoperative findings:   1. 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 2. Right retrograde pyelogram revealed a filling defect within the proximal aspects of the right ureter.  The right renal pelvis was incompletely visualized due to obstruction from the proximal ureteral lesion.  There were no filling defects seen within the distal aspects of the right ureter  Indication:  Maria Marquez is a 74 y.o. female who was found to have a 2.7 cm solid and enhancing lesion in the proximal aspects of the right ureter on CT during a work-up for gross hematuria.  She has been consented for the above procedures, voices understanding and wishes to proceed. Description of procedure:  After informed consent was obtained, the patient was brought to the operating room and general LMA anesthesia was administered. The patient was then placed in the dorsolithotomy position and prepped and draped in the usual sterile fashion. A timeout was performed. A 23 French rigid cystoscope was then inserted into the urethral meatus and advanced into the bladder under direct vision. A complete bladder survey revealed no intravesical pathology.  A 5 French ureteral catheter was then inserted into  the right ureteral orifice and a retrograde pyelogram was obtained, with the findings listed above.  A Glidewire was then used to intubate the lumen of the ureteral catheter and was advanced up to the right renal pelvis, under fluoroscopic guidance.  The catheter was then removed, leaving the wire in place.  A flexible ureteroscope was advanced alongside the wire and into the proximal aspects of the right ureter where a large papillary tumor was identified.  There was no evidence of a solitary source of her hematuria.  Right renal pelvis washings were obtained and sent for pathologic analysis.  I also obtained brush biopsies of the lesion.  Several photographs of the mass were taken and placed in the patient's chart.  The flexible ureteroscope was then removed, revealing no additional tumor burden within the distal aspects of the right ureter.  A stent was not left due to the atraumatic nature of the ureteroscopy.  The patient's bladder was drained.  She tolerated the procedure very well and was transferred to the postanesthesia in stable condition.  Plan: Follow-up on 04/25/2020 to discuss proceeding with a right nephroureterectomy.

## 2020-04-18 NOTE — Anesthesia Procedure Notes (Signed)
Date/Time: 04/18/2020 4:06 PM Performed by: Cynda Familia, CRNA Oxygen Delivery Method: Simple face mask Placement Confirmation: positive ETCO2 and breath sounds checked- equal and bilateral Dental Injury: Teeth and Oropharynx as per pre-operative assessment

## 2020-04-18 NOTE — Anesthesia Procedure Notes (Signed)
Procedure Name: LMA Insertion Date/Time: 04/18/2020 3:31 PM Performed by: Cynda Familia, CRNA Pre-anesthesia Checklist: Patient identified, Emergency Drugs available, Suction available and Patient being monitored Patient Re-evaluated:Patient Re-evaluated prior to induction Oxygen Delivery Method: Circle System Utilized Preoxygenation: Pre-oxygenation with 100% oxygen Induction Type: IV induction Ventilation: Mask ventilation without difficulty LMA: LMA inserted and LMA with gastric port inserted LMA Size: 4.0 Number of attempts: 1 Airway Equipment and Method: Bite block Placement Confirmation: positive ETCO2 Tube secured with: Tape Dental Injury: Teeth and Oropharynx as per pre-operative assessment  Comments: Smooth Iv induction Hatchette-- LMA insertion AM CRNA - atraumatic-- teeth and mouth as preop-- bilat BS

## 2020-04-18 NOTE — Transfer of Care (Signed)
Immediate Anesthesia Transfer of Care Note  Patient: Maria Marquez  Procedure(s) Performed: CYSTOSCOPY WITH URETEROSCOPY, BIOPSY (Right Ureter)  Patient Location: PACU  Anesthesia Type:General  Level of Consciousness: awake and alert   Airway & Oxygen Therapy: Patient Spontanous Breathing and Patient connected to face mask oxygen  Post-op Assessment: Report given to RN and Post -op Vital signs reviewed and stable  Post vital signs: Reviewed and stable  Last Vitals:  Vitals Value Taken Time  BP 147/55 04/18/20 1615  Temp    Pulse 80 04/18/20 1616  Resp 17 04/18/20 1616  SpO2 100 % 04/18/20 1616  Vitals shown include unvalidated device data.  Last Pain:  Vitals:   04/18/20 1331  TempSrc:   PainSc: 4       Patients Stated Pain Goal: 3 (46/65/99 3570)  Complications: No complications documented.

## 2020-04-18 NOTE — Anesthesia Preprocedure Evaluation (Signed)
Anesthesia Evaluation  Patient identified by MRN, date of birth, ID band Patient awake    Reviewed: Allergy & Precautions, NPO status , Patient's Chart, lab work & pertinent test results  History of Anesthesia Complications (+) PONV and history of anesthetic complications  Airway Mallampati: II       Dental  (+) Partial Upper, Partial Lower   Pulmonary Current Smoker and Patient abstained from smoking.,    Pulmonary exam normal breath sounds clear to auscultation       Cardiovascular hypertension, Pt. on medications negative cardio ROS Normal cardiovascular exam Rhythm:Regular  EKG 10/2015 OK   Neuro/Psych negative neurological ROS     GI/Hepatic Neg liver ROS,   Endo/Other  Hypothyroidism Morbid obesityObesity BMI 38  Renal/GU   negative genitourinary   Musculoskeletal   Abdominal (+) + obese,   Peds negative pediatric ROS (+)  Hematology negative hematology ROS (+) 13/39, INR 1.1, plts 283   Anesthesia Other Findings Eggs N/  Reproductive/Obstetrics negative OB ROS                             Anesthesia Physical  Anesthesia Plan  ASA: III  Anesthesia Plan: General   Post-op Pain Management:    Induction: Intravenous  PONV Risk Score and Plan: 3 and Ondansetron, Dexamethasone and Midazolam  Airway Management Planned: LMA  Additional Equipment: None  Intra-op Plan:   Post-operative Plan: Extubation in OR  Informed Consent: I have reviewed the patients History and Physical, chart, labs and discussed the procedure including the risks, benefits and alternatives for the proposed anesthesia with the patient or authorized representative who has indicated his/her understanding and acceptance.     Dental advisory given  Plan Discussed with: CRNA  Anesthesia Plan Comments:         Anesthesia Quick Evaluation

## 2020-04-18 NOTE — Anesthesia Postprocedure Evaluation (Signed)
Anesthesia Post Note  Patient: Maria Marquez  Procedure(s) Performed: CYSTOSCOPY WITH URETEROSCOPY, BIOPSY (Right Ureter)     Patient location during evaluation: PACU Anesthesia Type: General Level of consciousness: awake Pain management: pain level controlled Vital Signs Assessment: post-procedure vital signs reviewed and stable Respiratory status: spontaneous breathing, nonlabored ventilation, respiratory function stable and patient connected to nasal cannula oxygen Cardiovascular status: blood pressure returned to baseline and stable Postop Assessment: no apparent nausea or vomiting Anesthetic complications: no   No complications documented.  Last Vitals:  Vitals:   04/18/20 1645 04/18/20 1700  BP: (!) 149/66 (!) 143/66  Pulse: 75 69  Resp: 13 (!) 21  Temp:    SpO2: 96% 96%    Last Pain:  Vitals:   04/18/20 1700  TempSrc:   PainSc: 2                  Syanna Remmert P Octavius Shin

## 2020-04-18 NOTE — H&P (Signed)
PRE-OP H&P  Office Visit Report     04/03/2020   --------------------------------------------------------------------------------   Maria Marquez  MRN: [id number]  DOB: 02/21/1946, 74 year old Female  SSN:    PRIMARY CARE:  Maria L. Jonni Sanger, MD  REFERRING:  Maria Coke, PA  PROVIDER:  Bjorn Marquez, M.D.  TREATING:  Maria Marquez, M.D.  LOCATION:  [facility] [phone]     --------------------------------------------------------------------------------   CC/HPI: CC: Right ureteral lesion   HPI: Ms. Maria Marquez is a 74 year old female, referred by Dr. Matilde Marquez after she was found to have a 2.7 cm solid and enhancing lesion in the proximal aspects of the right ureter on CT during a work-up for gross hematuria. She was also noted to have a 13 mm left renal lesion, likely representing a proteinaceous/hyperdense cyst.   From a urinary standpoint, she reports gross hematuria with almost every void with occasional clot burden. She states that she has to crede periodically to completely empty. She denies dsyuria or suprapubic pain, but does report dull right flank pain associated with mild nausea.     ALLERGIES: IVP Dye - Trouble Breathing, Other Reaction, Tightness in throat    MEDICATIONS: Aspirin 81 mg tablet,chewable  Simvastatin 10 mg tablet  Cephalexin 500 mg capsule  Estradiol-Norethindrone Acetat 1 mg-0.5 mg tablet  Ezetimibe 10 mg tablet  Levothyroxine  Losartan-Hydrochlorothiazide 50 mg-12.5 mg tablet     GU PSH: Cystoscopy - 03/21/2020 Locm 300-399Mg/Ml Iodine,1Ml - 03/19/2020     NON-GU PSH: Hip Replacement, Right - 2017 Thyroid Surgery, thyroid lobe removal - 1979     GU PMH: Gross hematuria - 03/19/2020 Hemorrhagic cystitis - 03/18/2020 Nocturia - 03/18/2020 Urinary Frequency - 03/18/2020    NON-GU PMH: Arthritis GERD Hypercholesterolemia Hypertension Hypothyroidism    FAMILY HISTORY: 1 Daughter - Daughter 1 son - Son Breast  Cancer - Mother Diabetes - Father father deceased - Father mother deceased - Mother    Notes: Mother deceased at age 75 d/t breast cancer  Father deceased at age 74 d/t diabetes   SOCIAL HISTORY: Marital Status: Widowed Current Smoking Status: Patient smokes. Has smoked since 02/20/1990. Smokes 1/2 pack per day.   Tobacco Use Assessment Completed: Used Tobacco in last 30 days? Does not drink anymore.  Drinks 2 caffeinated drinks per day.    REVIEW OF SYSTEMS:    GU Review Female:   Patient reports frequent urination, hard to postpone urination, burning /pain with urination, stream starts and stops, and have to strain to urinate. Patient denies get up at night to urinate, leakage of urine, trouble starting your stream, and being pregnant.  Gastrointestinal (Upper):   Patient reports nausea. Patient denies vomiting and indigestion/ heartburn.  Gastrointestinal (Lower):   Patient reports diarrhea. Patient denies constipation.  Constitutional:   Patient denies fever, night sweats, weight loss, and fatigue.  Skin:   Patient denies skin rash/ lesion and itching.  Eyes:   Patient denies blurred vision and double vision.  Ears/ Nose/ Throat:   Patient denies sore throat and sinus problems.  Hematologic/Lymphatic:   Patient denies swollen glands and easy bruising.  Cardiovascular:   Patient denies leg swelling and chest pains.  Respiratory:   Patient denies cough and shortness of breath.  Endocrine:   Patient denies excessive thirst.  Musculoskeletal:   Patient reports back pain. Patient denies joint pain.  Neurological:   Patient denies headaches and dizziness.  Psychologic:   Patient denies depression and anxiety.   VITAL SIGNS:  04/03/2020 12:39 PM  Weight 243 lb / 110.22 kg  Height 66 in / 167.64 cm  BP 135/72 mmHg  Heart Rate 86 /min  Temperature 97.7 F / 36.5 C  BMI 39.2 kg/m   MULTI-SYSTEM PHYSICAL EXAMINATION:    Constitutional: Well-nourished. No physical deformities.  Normally developed. Good grooming.  Neurologic / Psychiatric: Oriented to time, oriented to place, oriented to person. No depression, no anxiety, no agitation.  Musculoskeletal: Normal gait and station of head and neck.     Complexity of Data:  Lab Test Review:   BUN/Creatinine  Records Review:   Previous Doctor Records  X-Ray Review: C.T. Hematuria: Reviewed Films. Reviewed Report. Discussed With Patient.     03/18/20  General Chemistry  BUN 17 mg/dL  Creatinine 1.0 mg/dL  eGFR African American 65.2   eGFR Non-Afr. American 56.2   Notes:                     CLINICAL DATA: Gross hematuria.   EXAM:  CT ABDOMEN AND PELVIS WITHOUT AND WITH CONTRAST   TECHNIQUE:  Multidetector CT imaging of the abdomen and pelvis was performed  following the standard protocol before and following the bolus  administration of intravenous contrast.   CONTRAST: Contrast 125 cc Omnipaque 300   COMPARISON: None.   FINDINGS:  Lower chest: Unremarkable.   Hepatobiliary: 16 mm well-defined low-density lesion in the left  liver is likely a cyst. Calcified gallstone evident. No intrahepatic  or extrahepatic biliary dilation.   Pancreas: No focal mass lesion. No dilatation of the main duct. No  intraparenchymal cyst. No peripancreatic edema.   Spleen: No splenomegaly. No focal mass lesion.   Adrenals/Urinary Tract: No adrenal nodule or mass.   Precontrast imaging shows no stones in either kidney or ureter. No  bladder stones. Portions of the distal right ureter and bladder are  obscured by beam hardening artifact.   There is moderate right hydroureteronephrosis secondary to enhancing  soft tissue in the lumen of the proximal ureter just distal to the  UPJ. This enhancing tissue expands the ureter which measures 1.5 x  1.5 cm in orthogonal diameters at this level in the lesion tracks  about 2.7 cm in craniocaudal dimension. Distal to this enhancing  soft tissue, the right ureter is decompressed.  Distal right ureter  and portions of the bladder are obscured by beam hardening artifact  from right hip replacement.   No overtly suspicious enhancing abnormality in either kidney. There  is a 13 mm hypoattenuating lesion in the lower pole left kidney with  attenuation higher than would be expected for simple fluid.  Assessment for contrast enhancement in this lesion is limited in  that the abnormality is not readily apparent on precontrast imaging.   Delayed imaging again shows the soft tissue filling defect in the  proximal right ureter. No wall thickening or soft tissue filling  defect in the left intrarenal collecting system. Left ureter  incompletely opacified. Partially obscured bladder shows no focal  bladder wall abnormality.   Stomach/Bowel: Tiny hiatal hernia. Stomach otherwise unremarkable.  Duodenum is normally positioned as is the ligament of Treitz. No  small bowel wall thickening. No small bowel dilatation. The terminal  ileum is normal. The appendix is normal. No gross colonic mass. No  colonic wall thickening.   Vascular/Lymphatic: There is abdominal aortic atherosclerosis  without aneurysm. There is no gastrohepatic or hepatoduodenal  ligament lymphadenopathy. No retroperitoneal or mesenteric  lymphadenopathy. No pelvic sidewall lymphadenopathy.  Reproductive: Exophytic fundal fibroid noted. There is no adnexal  mass.   Other: No intraperitoneal free fluid.   Musculoskeletal: Status post right total hip replacement. No  worrisome lytic or sclerotic osseous abnormality.   IMPRESSION:  1. 1.5 x 1.5 x 2.7 cm enhancing soft tissue filling defect in the  proximal right ureter causes moderate right hydroureteronephrosis.  Imaging features are highly suspicious for urothelial neoplasm.  Given lack of filling defect in the intrarenal collecting system and  apparent enhancement in the soft tissue, obstructing clot or  infectious debris is considered unlikely.  2.  13 mm hypoattenuating lesion lower pole left kidney with  attenuation higher than would be expected for simple fluid.  Assessment for contrast enhancement in this lesion is limited in  that the abnormality is not readily apparent on precontrast imaging.  Likely a cyst complicated by proteinaceous debris or hemorrhage,  close attention on follow-up recommended.  3. Tiny hiatal hernia.  4. Cholelithiasis.  5. Aortic Atherosclerosis (ICD10-I70.0).   These results will be called to the ordering clinician or  representative by the Radiologist Assistant, and communication  documented in the PACS or [employer].    Electronically Signed  By: Misty Stanley M.D.  On: 03/20/2020 09:38    PROCEDURES:          Urinalysis w/Scope Dipstick Dipstick Cont'd Micro  Color: Red Bilirubin: Neg mg/dL WBC/hpf: 0 - 5/hpf  Appearance: Cloudy Ketones: Neg mg/dL RBC/hpf: >60/hpf  Specific Gravity: 1.015 Blood: 3+ ery/uL Bacteria: Rare (0-9/hpf)  pH: <=5.0 Protein: 1+ mg/dL Cystals: NS (Not Seen)  Glucose: Neg mg/dL Urobilinogen: 0.2 mg/dL Casts: NS (Not Seen)    Nitrites: Neg Trichomonas: Not Present    Leukocyte Esterase: 1+ leu/uL Mucous: Not Present      Epithelial Cells: 0 - 5/hpf      Yeast: NS (Not Seen)      Sperm: Not Present    ASSESSMENT:      ICD-10 Details  1 GU:   Right renal neoplasm - D49.511 Right, Undiagnosed New Problem  2   Gross hematuria - R31.0 Chronic, Stable   PLAN:           Orders Labs CBC with Diff          Schedule Return Visit/Planned Activity: ASAP - Schedule Surgery          Document Letter(s):  Created for Maria L. Jonni Sanger, MD   Created for Patient: Clinical Summary         Notes:   -CT results discussed with the patient. I explained that the lesion in question is highly concerning for UCC, especially in light of her smoking history.  -The risks, benefits and alternatives of cystoscopy with RIGHT ureteroscopy, possible ureteral biopsy, possible laser  ablation of ureteral tumor and ureteral stent placement was discussed the patient. Risks included, but are not limited to: bleeding, urinary tract infection, ureteral injury/avulsion, ureteral stricture formation, the possibility that multiple surgeries may be required to treat the suspected tumor(s), MI, stroke, PE and the inherent risks of general anesthesia. The patient voices understanding and wishes to proceed.

## 2020-04-19 ENCOUNTER — Encounter (HOSPITAL_COMMUNITY): Payer: Self-pay | Admitting: Urology

## 2020-04-21 ENCOUNTER — Encounter (HOSPITAL_COMMUNITY): Payer: Self-pay | Admitting: Urology

## 2020-04-21 NOTE — OR Nursing (Signed)
Requested by lab to correct the order for the Right Ureter brushing.  Order was added and time out was checked by Jovita Gamma RN and [employer].

## 2020-04-22 ENCOUNTER — Encounter (INDEPENDENT_AMBULATORY_CARE_PROVIDER_SITE_OTHER): Payer: Self-pay | Admitting: Family Medicine

## 2020-04-22 ENCOUNTER — Encounter: Payer: Self-pay | Admitting: Family Medicine

## 2020-04-23 ENCOUNTER — Other Ambulatory Visit (INDEPENDENT_AMBULATORY_CARE_PROVIDER_SITE_OTHER): Payer: Self-pay | Admitting: Family Medicine

## 2020-04-23 LAB — CYTOLOGY - NON PAP

## 2020-04-23 NOTE — Telephone Encounter (Signed)
FYI

## 2020-04-25 DIAGNOSIS — C651 Malignant neoplasm of right renal pelvis: Secondary | ICD-10-CM | POA: Diagnosis not present

## 2020-04-25 DIAGNOSIS — R310 Gross hematuria: Secondary | ICD-10-CM | POA: Diagnosis not present

## 2020-04-29 ENCOUNTER — Other Ambulatory Visit: Payer: Self-pay | Admitting: Urology

## 2020-05-08 ENCOUNTER — Other Ambulatory Visit: Payer: Self-pay | Admitting: Family Medicine

## 2020-05-13 ENCOUNTER — Encounter: Payer: Self-pay | Admitting: Family Medicine

## 2020-05-14 DIAGNOSIS — R911 Solitary pulmonary nodule: Secondary | ICD-10-CM | POA: Diagnosis not present

## 2020-05-14 DIAGNOSIS — R918 Other nonspecific abnormal finding of lung field: Secondary | ICD-10-CM | POA: Diagnosis not present

## 2020-05-14 DIAGNOSIS — I700 Atherosclerosis of aorta: Secondary | ICD-10-CM | POA: Diagnosis not present

## 2020-05-14 DIAGNOSIS — J439 Emphysema, unspecified: Secondary | ICD-10-CM | POA: Diagnosis not present

## 2020-05-14 DIAGNOSIS — C651 Malignant neoplasm of right renal pelvis: Secondary | ICD-10-CM | POA: Diagnosis not present

## 2020-05-19 ENCOUNTER — Telehealth: Payer: Self-pay | Admitting: Oncology

## 2020-05-19 NOTE — Telephone Encounter (Signed)
Received a new pt referral from Dr. 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 Maria Marquez has been cld and scheduled to see Dr. Alen Blew on 10/15 at 11am. Pt aware to arrive 20 minutes.

## 2020-05-21 ENCOUNTER — Other Ambulatory Visit (HOSPITAL_COMMUNITY): Payer: Self-pay | Admitting: Urology

## 2020-05-21 DIAGNOSIS — C651 Malignant neoplasm of right renal pelvis: Secondary | ICD-10-CM

## 2020-05-26 ENCOUNTER — Other Ambulatory Visit: Payer: Self-pay

## 2020-05-26 ENCOUNTER — Inpatient Hospital Stay: Payer: Medicare HMO | Attending: Oncology | Admitting: Oncology

## 2020-05-26 VITALS — BP 148/62 | HR 71 | Temp 97.2°F | Resp 18 | Ht 66.0 in | Wt 246.7 lb

## 2020-05-26 DIAGNOSIS — C659 Malignant neoplasm of unspecified renal pelvis: Secondary | ICD-10-CM | POA: Insufficient documentation

## 2020-05-26 DIAGNOSIS — R918 Other nonspecific abnormal finding of lung field: Secondary | ICD-10-CM | POA: Diagnosis not present

## 2020-05-26 DIAGNOSIS — Z5111 Encounter for antineoplastic chemotherapy: Secondary | ICD-10-CM | POA: Insufficient documentation

## 2020-05-26 DIAGNOSIS — C651 Malignant neoplasm of right renal pelvis: Secondary | ICD-10-CM | POA: Insufficient documentation

## 2020-05-26 DIAGNOSIS — R911 Solitary pulmonary nodule: Secondary | ICD-10-CM | POA: Insufficient documentation

## 2020-05-26 MED ORDER — PROCHLORPERAZINE MALEATE 10 MG PO TABS: 10.0000 mg | ORAL_TABLET | Freq: Four times a day (QID) | ORAL | 0 refills | Status: DC | PRN

## 2020-05-26 MED ORDER — LIDOCAINE-PRILOCAINE 2.5-2.5 % EX CREA: 1.0000 "application " | TOPICAL_CREAM | CUTANEOUS | 0 refills | Status: DC | PRN

## 2020-05-26 NOTE — Progress Notes (Signed)
Reason for the request:    Urothelial carcinoma of the renal pelvis.  HPI: I was asked by Dr. Lovena Neighbours to evaluate Maria Marquez for the evaluation of urothelial carcinoma of the genitourinary tract.  She is a 74 year old woman with history of chronic renal insufficiency, hypertension and hypothyroidism presented with symptoms of hematuria in the last 2 months.  She was evaluated by a CT scan for hematuria and found to have a 2.7 cm solid enhancing lesion in the proximal aspect of the right ureter.  She subsequently underwent cystoscopy and right ureteroscopy and biopsy of a renal pelvis mass completed by Dr. Lovena Neighbours on April 18, 2020.  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  Based on these findings she was scheduled to have a nephro ureterectomy but underwent a CT scan of the chest on 05/14/2020 which showed a nodular opacity in the suprahilar right upper lobe measuring 1.3 x 1.1 cm.  There is a 5 mm pulmonary nodule in the posterior right upper lobe.  Findings are consistent with metastatic disease although very nonspecific.  Based on these findings he was referred to me for evaluation.  Clinically, she reports no complaints at this time.  She denies any flank pain, hematuria or dysuria.  She denies any shortness of breath or difficulty breathing.  She does have a smoking history but has discontinued smoking the last 10 days.  She does not report any headaches, blurry vision, syncope or seizures. Does not report any fevers, chills or sweats.  Does not report any cough, wheezing or hemoptysis.  Does not report any chest pain, palpitation, orthopnea or leg edema.  Does not report any nausea, vomiting or abdominal pain.  Does not report any constipation or diarrhea.  Does not report any skeletal complaints.    Does not report frequency, urgency or hematuria.  Does not report any skin rashes or lesions. Does not report any heat or cold intolerance.  Does not report any  lymphadenopathy or petechiae.  Does not report any anxiety or depression.  Remaining review of systems is negative.    Past Medical History:  Diagnosis Date  . Acute blood loss anemia   . Arthritis   . Back pain   . Bilious vomiting with nausea   . 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 Chronic kidney disease, stage 3a 12/31/2019  . Complication of anesthesia   . Constipation   . Dyspnea    On exertion and rest at times  . Gastroesophageal reflux disease without esophagitis 06/17/2015  . GERD (gastroesophageal reflux disease)   . HLD (hyperlipidemia)   . Hypertension   . Hypothyroid   . Joint pain   . Leg edema   . Leukocytosis   . Multiple food allergies   . Myalgia due to statin 11/23/2018  . Osteoarthritis of hip    Right  . Pneumonia   . PONV (postoperative nausea and vomiting)   . Postmenopausal hormone replacement therapy 06/17/2015  . Pre-diabetes   . Prediabetes   . Sessile colonic polyp 10/04/2017   Colonoscopy 12/2015, Dr. Fuller Plan, repeat q 5 years.  :  Past Surgical History:  Procedure Laterality Date  . CYSTOSCOPY WITH URETEROSCOPY Right 04/18/2020   Procedure: CYSTOSCOPY WITH URETEROSCOPY, BIOPSY;  Surgeon: Ceasar Mons, MD;  Location: [facility];  Service: Urology;  Laterality: Right;  ONLY NEEDS 45 MIN  . EYE SURGERY  1987  . HERNIA REPAIR    . THYROID LOBECTOMY  right side  . TOTAL HIP ARTHROPLASTY Right 01/28/2016   Procedure: RIGHT TOTAL HIP ARTHROPLASTY ANTERIOR APPROACH;  Surgeon: [name], MD;  Location: [facility];  Service: Orthopedics;  Laterality: Right;  :   Current Outpatient Medications:  .  acetaminophen (TYLENOL) 500 MG tablet, Take 1,000-1,500 mg by mouth every 8 (eight) hours as needed for moderate pain., Disp: , Rfl:  .  albuterol (VENTOLIN HFA) 108 (90 Base) MCG/ACT inhaler, Inhale 1-2 puffs into the lungs every 6 (six) hours as needed for wheezing or shortness of breath., Disp: 18 g, Rfl: 0 .  Cholecalciferol (VITAMIN D3) 250  MCG (10000 UT) capsule, Take 10,000 Units by mouth daily., Disp: , Rfl:  .  EPINEPHrine (EPIPEN 2-PAK) 0.3 mg/0.3 mL IJ SOAJ injection, Inject 0.3 mg into the muscle as needed for anaphylaxis. , Disp: , Rfl:  .  estradiol-norethindrone (ACTIVELLA) 1-0.5 MG tablet, Take 1 tablet by mouth daily. (Patient taking differently: Take 1 tablet by mouth every other day. ), Disp: 84 tablet, Rfl: 3 .  ezetimibe (ZETIA) 10 MG tablet, TAKE 1 TABLET EVERY DAY (Patient taking differently: Take 10 mg by mouth daily. ), Disp: 90 tablet, Rfl: 3 .  levothyroxine (SYNTHROID) 137 MCG tablet, TAKE 1 TABLET (137 MCG TOTAL) BY MOUTH AT BEDTIME., Disp: 90 tablet, Rfl: 3 .  losartan-hydrochlorothiazide (HYZAAR) 50-12.5 MG tablet, TAKE 1 TABLET EVERY DAY, Disp: 90 tablet, Rfl: 3 .  Multiple Vitamin (MULTIVITAMIN WITH MINERALS) TABS tablet, Take 1 tablet by mouth daily., Disp: , Rfl:  .  ondansetron (ZOFRAN) 4 MG tablet, Take 1 tablet (4 mg total) by mouth daily as needed for nausea or vomiting., Disp: 30 tablet, Rfl: 1 .  oxybutynin (DITROPAN) 5 MG tablet, Take 1 tablet (5 mg total) by mouth every 8 (eight) hours as needed for bladder spasms., Disp: 30 tablet, Rfl: 1 .  phenazopyridine (PYRIDIUM) 200 MG tablet, Take 1 tablet (200 mg total) by mouth 3 (three) times daily as needed (for pain with urination)., Disp: 30 tablet, Rfl: 0 .  Probiotic CAPS, Take 1 capsule by mouth daily as needed (when taking antibiotics)., Disp: , Rfl:  .  simvastatin (ZOCOR) 10 MG tablet, TAKE 1 TABLET (10 MG TOTAL) BY MOUTH 2 (TWO) TIMES A WEEK. (Patient taking differently: Take 10 mg by mouth 2 (two) times a week. Saturday and Sunday), Disp: 26 tablet, Rfl: 3 .  TURMERIC CURCUMIN PO, Take 2,250 mg by mouth daily., Disp: , Rfl:  .  Vitamin D, Ergocalciferol, (DRISDOL) 1.25 MG (50000 UNIT) CAPS capsule, Take 1 capsule (50,000 Units total) by mouth every 7 (seven) days. (Patient taking differently: Take 50,000 Units by mouth every Thursday. ), Disp: 4  capsule, Rfl: 0 .  XIIDRA 5 % SOLN, Place 1 drop into both eyes daily as needed (dry eyes). , Disp: , Rfl:  .  ZINC-VITAMIN C PO, Take 1 tablet by mouth daily. With rose hips, Disp: , Rfl: :  Allergies  Allergen Reactions  . Iodine Shortness Of Breath  . Lasix [Furosemide] Itching  . Rosuvastatin Other (See Comments)    Muscle Pain in Thighs  . Shellfish Allergy Hives  . Sulfa Antibiotics Nausea Only  . Latex Rash  . Lisinopril Cough  . Tramadol Itching  :  Family History  Problem Relation Age of Onset  . Arthritis Mother   . Breast cancer Mother   . Schizophrenia Mother   . Diabetes Father   . Heart disease Father   . Kidney disease Father   .  Stroke Sister   . Lung cancer Brother   . Heart disease Brother   . Lung cancer Brother   . Heart disease Brother   . Vision loss Brother        Glaucoma  . Alcohol abuse Brother   :  Social History   Socioeconomic History  . Marital status: Widowed    Spouse name: Not on file  . Number of children: Not on file  . Years of education: Not on file  . Highest education level: Not on file  Occupational History  . Occupation: Retired  Tobacco Use  . Smoking status: Current Every Day Smoker    Packs/day: 0.25    Types: Cigarettes  . Smokeless tobacco: Current User  Vaping Use  . Vaping Use: Never used  Substance and Sexual Activity  . Alcohol use: Yes    Comment: rarely  . Drug use: No  . Sexual activity: Never  Other Topics Concern  . Not on file  Social History Narrative  . Not on file   Social Determinants of Health   Financial Resource Strain:   . Difficulty of Paying Living Expenses: Not on file  Food Insecurity:   . Worried About Charity fundraiser in the Last Year: Not on file  . Ran Out of Food in the Last Year: Not on file  Transportation Needs:   . Lack of Transportation (Medical): Not on file  . Lack of Transportation (Non-Medical): Not on file  Physical Activity:   . Days of Exercise per Week: Not  on file  . Minutes of Exercise per Session: Not on file  Stress:   . Feeling of Stress : Not on file  Social Connections:   . Frequency of Communication with Friends and Family: Not on file  . Frequency of Social Gatherings with Friends and Family: Not on file  . Attends Religious Services: Not on file  . Active Member of Clubs or Organizations: Not on file  . Attends Archivist Meetings: Not on file  . Marital Status: Not on file  Intimate Partner Violence:   . Fear of Current or Ex-Partner: Not on file  . Emotionally Abused: Not on file  . Physically Abused: Not on file  . Sexually Abused: Not on file  :  Pertinent items are noted in HPI.  Exam: Blood pressure (!) 148/62, pulse 71, temperature (!) 97.2 F (36.2 C), temperature source Tympanic, resp. rate 18, height 5\' 6"  (1.676 m), weight 246 lb 11.2 oz (111.9 kg), SpO2 99 %.  ECOG 1  General appearance: alert and cooperative appeared without distress. Head: atraumatic without any abnormalities. Eyes: conjunctivae/corneas clear. PERRL.  Sclera anicteric. Throat: lips, mucosa, and tongue normal; without oral thrush or ulcers. Resp: clear to auscultation bilaterally without rhonchi, wheezes or dullness to percussion. Cardio: regular rate and rhythm, S1, S2 normal, no murmur, click, rub or gallop GI: soft, non-tender; bowel sounds normal; no masses,  no organomegaly Skin: Skin color, texture, turgor normal. No rashes or lesions Lymph nodes: Cervical, supraclavicular, and axillary nodes normal. Neurologic: Grossly normal without any motor, sensory or deep tendon reflexes. Musculoskeletal: No joint deformity or effusion.  .  Assessment and Plan:   74 year old woman with  1.  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  She found to have right renal pelvic mass that is biopsy-proven.  Staging work-up did show a pulmonary mass on a chest CT scan with 1.3 x 1.1 right hilar  nodule.  The natural course of this disease and treatment options were discussed today with the patient and her daughter.  She has high-grade urothelial carcinoma would require the treatment of both systemic chemotherapy and surgical resection.  I favor proceeding with upfront chemotherapy given her borderline kidney function that could worsen with a nephrectomy.  In addition to the potential developing stage IV disease and pulmonary involvement makes using upfront chemotherapy a better option.   The logistics and rationale for using chemotherapy was reviewed today in detail.  Complication associated with cisplatin or carboplatin and gemcitabine chemotherapy was discussed.  These complications include nausea, vomiting, myelosuppression, fatigue, infusion related complications, renal insufficiency, neutropenia, neutropenic sepsis and rarely serious thrombosis, hospitalization and death.  The benefit would also if he has an excellent response to chemotherapy, curative surgical resection may be attempted.  The plan is to treat with gemcitabine and cisplatin on day 1, gemcitabine day 8 out of a 21-day cycle.  Anticipate needing 4-6 cycles of therapy    After discussion today, he is agreeable to proceed after chemo education class.  She will undergo PET imaging to complete her staging work-up.     2.  IV access: Risks and benefits of using Port-A-Cath versus peripheral veins was discussed today.  Complication associated with Port-A-Cath insertion include bleeding, infection and thrombosis.  After discussing the risks and benefits, she is agreeable to proceed.   3.  Antiemetics: Prescription for Compazine was made available to him.     4.  Renal function surveillance:  Her creatinine clearance is around 45 cc/min and will repeat creatinine before starting chemotherapy.    5.  Goals of care:  Therapy is curative at this time if she has a no metastatic disease or very limited metastatic disease.  Therapy  will be palliative if she has more advanced disease.  6.  Pulmonary nodule: The differential diagnosis was reviewed at this time.  Primary lung neoplasm given her smoking history is a possibility versus metastatic disease from a urothelial carcinoma.  I prefer obtaining tissue diagnosis before assuming treatment choice at this time.  We will refer her to pulmonary medicine for bronchoscopy.   7.  Follow-up: We will be in the immediate future to start chemotherapy.  60  minutes were dedicated to this visit. The time was spent on reviewing laboratory data, imaging studies, discussing treatment options, discussing differential diagnosis and answering questions regarding future plan.      A copy of this consult has been forwarded to the requesting physician.

## 2020-05-26 NOTE — Progress Notes (Signed)
START OFF PATHWAY REGIMEN - Bladder   [id number]:Gemcitabine + Cisplatin every 21 days:   A cycle is every 21 days:     Gemcitabine      Cisplatin   **Always confirm dose/schedule in your pharmacy ordering system**  Patient Characteristics: Pre-Cystectomy or Nonsurgical Candidate (Clinical Staging), Distant Metastases Therapeutic Status: Pre-Cystectomy or Nonsurgical Candidate (Clinical Staging) AJCC M Category: cM1 AJCC 8 Stage Grouping: IVA AJCC T Category: cTX AJCC N Category: cNX Intent of Therapy: Curative Intent, Discussed with Patient

## 2020-05-27 ENCOUNTER — Telehealth: Payer: Self-pay | Admitting: Oncology

## 2020-05-27 NOTE — Telephone Encounter (Signed)
Scheduled per 10/04 los, patient has been called and notified.

## 2020-05-30 ENCOUNTER — Other Ambulatory Visit: Payer: Self-pay

## 2020-05-30 ENCOUNTER — Inpatient Hospital Stay: Payer: Medicare HMO

## 2020-05-30 ENCOUNTER — Encounter: Payer: Self-pay | Admitting: Oncology

## 2020-05-30 NOTE — Progress Notes (Signed)
Met with patient at registration to introduce myself as Arboriculturist and to offer available resources.  Patient has 2 insurances, therefore copay assistance should not needed.   Discussed one-time $1000 Radio broadcast assistant to assist with personal expenses while going through treatment. She states it doesn't apply to her.  Gave her my card for any additional financial questions or concerns.

## 2020-06-02 ENCOUNTER — Inpatient Hospital Stay: Payer: Medicare HMO

## 2020-06-02 ENCOUNTER — Ambulatory Visit (HOSPITAL_COMMUNITY)
Admission: RE | Admit: 2020-06-02 | Discharge: 2020-06-02 | Disposition: A | Discharge status: Home / Self Care | Payer: Medicare HMO | Source: Ambulatory Visit | Attending: Urology | Admitting: Urology

## 2020-06-02 ENCOUNTER — Other Ambulatory Visit

## 2020-06-02 ENCOUNTER — Other Ambulatory Visit: Payer: Self-pay | Admitting: Radiology

## 2020-06-02 ENCOUNTER — Other Ambulatory Visit: Payer: Self-pay

## 2020-06-02 DIAGNOSIS — Z5111 Encounter for antineoplastic chemotherapy: Secondary | ICD-10-CM | POA: Diagnosis not present

## 2020-06-02 DIAGNOSIS — C651 Malignant neoplasm of right renal pelvis: Secondary | ICD-10-CM | POA: Diagnosis not present

## 2020-06-02 DIAGNOSIS — C679 Malignant neoplasm of bladder, unspecified: Secondary | ICD-10-CM | POA: Diagnosis not present

## 2020-06-02 DIAGNOSIS — R911 Solitary pulmonary nodule: Secondary | ICD-10-CM | POA: Diagnosis not present

## 2020-06-02 DIAGNOSIS — C659 Malignant neoplasm of unspecified renal pelvis: Secondary | ICD-10-CM

## 2020-06-02 LAB — CMP (CANCER CENTER ONLY)
ALT: 17 U/L (ref 0–44)
AST: 12 U/L — ABNORMAL LOW (ref 15–41)
Albumin: 3.5 g/dL (ref 3.5–5.0)
Alkaline Phosphatase: 67 U/L (ref 38–126)
Anion gap: 8 (ref 5–15)
BUN: 18 mg/dL (ref 8–23)
CO2: 28 mmol/L (ref 22–32)
Calcium: 9.4 mg/dL (ref 8.9–10.3)
Chloride: 105 mmol/L (ref 98–111)
Creatinine: 1.34 mg/dL — ABNORMAL HIGH (ref 0.44–1.00)
GFR, Estimated: 39 mL/min — ABNORMAL LOW (ref >60)
Glucose, Bld: 115 mg/dL — ABNORMAL HIGH (ref 70–99)
Potassium: 4.3 mmol/L (ref 3.5–5.1)
Sodium: 141 mmol/L (ref 135–145)
Total Bilirubin: 1 mg/dL (ref 0.3–1.2)
Total Protein: 6.6 g/dL (ref 6.5–8.1)

## 2020-06-02 LAB — CBC WITH DIFFERENTIAL (CANCER CENTER ONLY)
Abs Immature Granulocytes: 0.02 10*3/uL (ref 0.00–0.07)
Basophils Absolute: 0 10*3/uL (ref 0.0–0.1)
Basophils Relative: 0 %
Eosinophils Absolute: 0.2 10*3/uL (ref 0.0–0.5)
Eosinophils Relative: 3 %
HCT: 40.2 % (ref 36.0–46.0)
Hemoglobin: 13.3 g/dL (ref 12.0–15.0)
Immature Granulocytes: 0 %
Lymphocytes Relative: 22 %
Lymphs Abs: 1.9 10*3/uL (ref 0.7–4.0)
MCH: 30.9 pg (ref 26.0–34.0)
MCHC: 33.1 g/dL (ref 30.0–36.0)
MCV: 93.3 fL (ref 80.0–100.0)
Monocytes Absolute: 0.7 10*3/uL (ref 0.1–1.0)
Monocytes Relative: 8 %
Neutro Abs: 5.9 10*3/uL (ref 1.7–7.7)
Neutrophils Relative %: 67 %
Platelet Count: 220 10*3/uL (ref 150–400)
RBC: 4.31 MIL/uL (ref 3.87–5.11)
RDW: 13 % (ref 11.5–15.5)
WBC Count: 8.7 10*3/uL (ref 4.0–10.5)
nRBC: 0 % (ref 0.0–0.2)

## 2020-06-02 LAB — GLUCOSE, CAPILLARY: Glucose-Capillary: 110 mg/dL — ABNORMAL HIGH (ref 70–99)

## 2020-06-02 MED ORDER — FLUDEOXYGLUCOSE F - 18 (FDG) INJECTION
12.2000 | Freq: Once | INTRAVENOUS | Status: AC | PRN
  Administered 2020-06-02: 12.2 via INTRAVENOUS

## 2020-06-02 NOTE — Progress Notes (Signed)
Pharmacist Chemotherapy Monitoring - Initial Assessment    Anticipated start date: 06/06/20   Regimen:  . Are orders appropriate based on the patient's diagnosis, regimen, and cycle? Yes . Does the plan date match the patient's scheduled date? Yes . Is the sequencing of drugs appropriate? Yes . Are the premedications appropriate for the patient's regimen? Yes . Prior Authorization for treatment is: Pending o If applicable, is the correct biosimilar selected based on the patient's insurance? not applicable  Organ Function and Labs: [name] Are dose adjustments needed based on the patient's renal function, hepatic function, or hematologic function? Yes . Are appropriate labs ordered prior to the start of patient's treatment? Yes . Other organ system assessment, if indicated: cisplatin: baseline audiogram . The following baseline labs, if indicated, have been ordered: N/A  Dose Assessment: . Are the drug doses appropriate? Yes . Are the following correct: o Drug concentrations Yes o IV fluid compatible with drug Yes o Administration routes Yes o Timing of therapy Yes . If applicable, does the patient have documented access for treatment and/or plans for port-a-cath placement? not applicable . If applicable, have lifetime cumulative doses been properly documented and assessed? not applicable Lifetime Dose Tracking  No doses have been documented on this patient for the following tracked chemicals: Doxorubicin, Epirubicin, Idarubicin, Daunorubicin, Mitoxantrone, Bleomycin, Oxaliplatin, Carboplatin, Liposomal Doxorubicin  o   Toxicity Monitoring/Prevention: . The patient has the following take home antiemetics prescribed: Ondansetron, Prochlorperazine and Lorazepam . The patient has the following take home medications prescribed: N/A . Medication allergies and previous infusion related reactions, if applicable, have been reviewed and addressed. Yes . The patient's current medication list has  been assessed for drug-drug interactions with their chemotherapy regimen. no significant drug-drug interactions were identified on review.  Order Review: . Are the treatment plan orders signed? No . Is the patient scheduled to see a provider prior to their treatment? Yes  I verify that I have reviewed each item in the above checklist and answered each question accordingly.  Quanetta Truss K 06/02/2020 3:09 PM

## 2020-06-03 ENCOUNTER — Encounter: Payer: Self-pay | Admitting: Emergency Medicine

## 2020-06-03 ENCOUNTER — Other Ambulatory Visit: Payer: Self-pay

## 2020-06-03 ENCOUNTER — Ambulatory Visit (INDEPENDENT_AMBULATORY_CARE_PROVIDER_SITE_OTHER): Payer: Medicare HMO | Admitting: Emergency Medicine

## 2020-06-03 DIAGNOSIS — R911 Solitary pulmonary nodule: Secondary | ICD-10-CM | POA: Insufficient documentation

## 2020-06-03 NOTE — Progress Notes (Signed)
Subjective:    Patient ID: Maria Marquez, female    DOB: 16-Mar-1946, 74 y.o.   MRN: [id number]  HPI 74 year old smoker (20 pack years) with history of hypertension, hypothyroidism new diagnosis of urothelial carcinoma involving the right ureter.  Part of her staging evaluation included a CT scan of the chest as below that showed a 1.1 x 1.3 cm pulmonary nodule in the right upper lobe.  She will likely require surgical resection of her mass as well as chemotherapy.  Unclear whether the right upper lobe nodule represents metastatic disease versus a new primary bronchogenic carcinoma.  She is referred today for further evaluation of the nodule.  CT scan of the chest reviewed by me, done 9/22 at [facility] urology, shows 1.1 x 1.3 cm pulmonary nodule in the RUL, a smaller 93mm posterior RUL nodule   Review of Systems As per HPI  Past Medical History:  Diagnosis Date  . Acute blood loss anemia   . Arthritis   . Back pain   . Bilious vomiting with nausea   . 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 Chronic kidney disease, stage 3a ([place]) 12/31/2019  . Complication of anesthesia   . Constipation   . Dyspnea    On exertion and rest at times  . Gastroesophageal reflux disease without esophagitis 06/17/2015  . GERD (gastroesophageal reflux disease)   . HLD (hyperlipidemia)   . Hypertension   . Hypothyroid   . Joint pain   . Leg edema   . Leukocytosis   . Multiple food allergies   . Myalgia due to statin 11/23/2018  . Osteoarthritis of hip    Right  . Pneumonia   . PONV (postoperative nausea and vomiting)   . Postmenopausal hormone replacement therapy 06/17/2015  . Pre-diabetes   . Prediabetes   . Sessile colonic polyp 10/04/2017   Colonoscopy 12/2015, Dr. Fuller Plan, repeat q 5 years.     Family History  Problem Relation Age of Onset  . Arthritis Mother   . Breast cancer Mother   . Schizophrenia Mother   . Diabetes Father   . Heart disease Father   . Kidney disease Father   . Stroke  Sister   . Lung cancer Brother   . Heart disease Brother   . Lung cancer Brother   . Heart disease Brother   . Vision loss Brother        Glaucoma  . Alcohol abuse Brother      Social History   Socioeconomic History  . Marital status: Widowed    Spouse name: Not on file  . Number of children: Not on file  . Years of education: Not on file  . Highest education level: Not on file  Occupational History  . Occupation: Retired  Tobacco Use  . Smoking status: Former Smoker    Packs/day: 1.00    Years: 45.00    Pack years: 45.00    Types: Cigarettes    Quit date: 04/18/2020    Years since quitting: 0.1  . Smokeless tobacco: Former Systems developer  . Tobacco comment: Pt quit smoking 3 weeks ago.   Vaping Use  . Vaping Use: Never used  Substance and Sexual Activity  . Alcohol use: Yes    Comment: rarely  . Drug use: No  . Sexual activity: Never  Other Topics Concern  . Not on file  Social History Narrative  . Not on file   Social Determinants of Health   Financial Resource  Strain:   . Difficulty of Paying Living Expenses: Not on file  Food Insecurity:   . Worried About Charity fundraiser in the Last Year: Not on file  . Ran Out of Food in the Last Year: Not on file  Transportation Needs:   . Lack of Transportation (Medical): Not on file  . Lack of Transportation (Non-Medical): Not on file  Physical Activity:   . Days of Exercise per Week: Not on file  . Minutes of Exercise per Session: Not on file  Stress:   . Feeling of Stress : Not on file  Social Connections:   . Frequency of Communication with Friends and Family: Not on file  . Frequency of Social Gatherings with Friends and Family: Not on file  . Attends Religious Services: Not on file  . Active Member of Clubs or Organizations: Not on file  . Attends Archivist Meetings: Not on file  . Marital Status: Not on file  Intimate Partner Violence:   . Fear of Current or Ex-Partner: Not on file  . Emotionally  Abused: Not on file  . Physically Abused: Not on file  . Sexually Abused: Not on file     Allergies  Allergen Reactions  . Iodine Shortness Of Breath  . Lasix [Furosemide] Itching  . Rosuvastatin Other (See Comments)    Muscle Pain in Thighs  . Shellfish Allergy Hives  . Sulfa Antibiotics Nausea Only  . Latex Rash  . Lisinopril Cough  . Tramadol Itching     Outpatient Medications Prior to Visit  Medication Sig Dispense Refill  . acetaminophen (TYLENOL) 500 MG tablet Take 1,000-1,500 mg by mouth every 8 (eight) hours as needed for moderate pain.    [name] albuterol (VENTOLIN HFA) 108 (90 Base) MCG/ACT inhaler Inhale 1-2 puffs into the lungs every 6 (six) hours as needed for wheezing or shortness of breath. 18 g 0  . Cholecalciferol (VITAMIN D3) 250 MCG (10000 UT) capsule Take 10,000 Units by mouth daily.    [name] EPINEPHrine (EPIPEN 2-PAK) 0.3 mg/0.3 mL IJ SOAJ injection Inject 0.3 mg into the muscle as needed for anaphylaxis.     [name] estradiol-norethindrone (ACTIVELLA) 1-0.5 MG tablet Take 1 tablet by mouth daily. (Patient taking differently: Take 1 tablet by mouth every other day. ) 84 tablet 3  . ezetimibe (ZETIA) 10 MG tablet TAKE 1 TABLET EVERY DAY 90 tablet 3  . levothyroxine (SYNTHROID) 137 MCG tablet TAKE 1 TABLET (137 MCG TOTAL) BY MOUTH AT BEDTIME. 90 tablet 3  . lidocaine-prilocaine (EMLA) cream Apply 1 application topically as needed. 30 g 0  . losartan-hydrochlorothiazide (HYZAAR) 50-12.5 MG tablet TAKE 1 TABLET EVERY DAY 90 tablet 3  . Multiple Vitamin (MULTIVITAMIN WITH MINERALS) TABS tablet Take 1 tablet by mouth daily.    . Probiotic CAPS Take 1 capsule by mouth daily as needed (when taking antibiotics).    . simvastatin (ZOCOR) 10 MG tablet TAKE 1 TABLET (10 MG TOTAL) BY MOUTH 2 (TWO) TIMES A WEEK. (Patient taking differently: Take 10 mg by mouth 2 (two) times a week. Saturday and Sunday) 26 tablet 3  . TURMERIC CURCUMIN PO Take 2,250 mg by mouth daily.    . Vitamin D,  Ergocalciferol, (DRISDOL) 1.25 MG (50000 UNIT) CAPS capsule Take 1 capsule (50,000 Units total) by mouth every 7 (seven) days. (Patient taking differently: Take 50,000 Units by mouth every Thursday. ) 4 capsule 0  . ZINC-VITAMIN C PO Take 1 tablet by mouth daily. With rose hips    .  ondansetron (ZOFRAN) 4 MG tablet Take 1 tablet (4 mg total) by mouth daily as needed for nausea or vomiting. (Patient not taking: Reported on 06/03/2020) 30 tablet 1  . oxybutynin (DITROPAN) 5 MG tablet Take 1 tablet (5 mg total) by mouth every 8 (eight) hours as needed for bladder spasms. 30 tablet 1  . phenazopyridine (PYRIDIUM) 200 MG tablet Take 1 tablet (200 mg total) by mouth 3 (three) times daily as needed (for pain with urination). 30 tablet 0  . prochlorperazine (COMPAZINE) 10 MG tablet Take 1 tablet (10 mg total) by mouth every 6 (six) hours as needed for nausea or vomiting. (Patient not taking: Reported on 06/03/2020) 30 tablet 0  . XIIDRA 5 % SOLN Place 1 drop into both eyes daily as needed (dry eyes).  (Patient not taking: Reported on 06/03/2020)     No facility-administered medications prior to visit.         Objective:   Physical Exam Vitals:   06/03/20 1453  BP: 132/80  Pulse: (!) 56  Temp: 97.7 F (36.5 C)  TempSrc: Temporal  SpO2: 98%  Weight: 248 lb 12.8 oz (112.9 kg)  Height: 5\' 6"  (1.676 m)   Gen: Pleasant, overwt, in no distress,  normal affect  ENT: No lesions,  mouth clear,  oropharynx clear, no postnasal drip  Neck: No JVD, no stridor  Lungs: No use of accessory muscles, scattered rhonchi  Cardiovascular: RRR, heart sounds normal, no murmur or gallops, no peripheral edema  Musculoskeletal: No deformities, no cyanosis or clubbing  Neuro: alert, awake, non focal  Skin: Warm, no lesions or rash     Assessment & Plan:  Pulmonary nodule 1.3 cm right upper lobe pulmonary nodule that is hypermetabolic by PET.  There is another 5 mm right upper lobe nodule of unclear  significance.  The larger nodule could reflect metastatic disease, could reflect primary lung cancer.  Tissue diagnosis will impact therapeutic decisions.  I think she needs bronchoscopy, navigation for biopsies.  Discussed this with her today.  I will contact Dr. Alen Blew to discuss timing.  I may be able to do the bronchoscopy before she starts chemotherapy if he thinks that is best.  We reviewed your CT scan of the chest today. Recommend that we arrange for bronchoscopy with biopsies of your right upper lobe pulmonary nodule. Dr. Lamonte Sakai will discuss with Dr. Alen Blew so that we can determine most appropriate timing, either before or after your initial chemotherapy treatment. We will call you to solidify the timing for your procedure after reviewing with Dr. Alen Blew. Follow with Dr Lamonte Sakai in 1 month  Baltazar Apo, MD, PhD 06/03/2020, 5:20 PM [facility] [phone] or if no answer [phone]

## 2020-06-03 NOTE — H&P (View-Only) (Signed)
Subjective:    Patient ID: Maria Marquez, female    DOB: 03/23/1946, 74 y.o.   MRN: [id number]  HPI 74 year old smoker (20 pack years) with history of hypertension, hypothyroidism new diagnosis of urothelial carcinoma involving the right ureter.  Part of her staging evaluation included a CT scan of the chest as below that showed a 1.1 x 1.3 cm pulmonary nodule in the right upper lobe.  She will likely require surgical resection of her mass as well as chemotherapy.  Unclear whether the right upper lobe nodule represents metastatic disease versus a new primary bronchogenic carcinoma.  She is referred today for further evaluation of the nodule.  CT scan of the chest reviewed by me, done 9/22 at [facility] urology, shows 1.1 x 1.3 cm pulmonary nodule in the RUL, a smaller 36mm posterior RUL nodule   Review of Systems As per HPI  Past Medical History:  Diagnosis Date  . Acute blood loss anemia   . Arthritis   . Back pain   . Bilious vomiting with nausea   . 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 Chronic kidney disease, stage 3a ([place]) 12/31/2019  . Complication of anesthesia   . Constipation   . Dyspnea    On exertion and rest at times  . Gastroesophageal reflux disease without esophagitis 06/17/2015  . GERD (gastroesophageal reflux disease)   . HLD (hyperlipidemia)   . Hypertension   . Hypothyroid   . Joint pain   . Leg edema   . Leukocytosis   . Multiple food allergies   . Myalgia due to statin 11/23/2018  . Osteoarthritis of hip    Right  . Pneumonia   . PONV (postoperative nausea and vomiting)   . Postmenopausal hormone replacement therapy 06/17/2015  . Pre-diabetes   . Prediabetes   . Sessile colonic polyp 10/04/2017   Colonoscopy 12/2015, Dr. Fuller Plan, repeat q 5 years.     Family History  Problem Relation Age of Onset  . Arthritis Mother   . Breast cancer Mother   . Schizophrenia Mother   . Diabetes Father   . Heart disease Father   . Kidney disease Father   . Stroke  Sister   . Lung cancer Brother   . Heart disease Brother   . Lung cancer Brother   . Heart disease Brother   . Vision loss Brother        Glaucoma  . Alcohol abuse Brother      Social History   Socioeconomic History  . Marital status: Widowed    Spouse name: Not on file  . Number of children: Not on file  . Years of education: Not on file  . Highest education level: Not on file  Occupational History  . Occupation: Retired  Tobacco Use  . Smoking status: Former Smoker    Packs/day: 1.00    Years: 45.00    Pack years: 45.00    Types: Cigarettes    Quit date: 04/18/2020    Years since quitting: 0.1  . Smokeless tobacco: Former Systems developer  . Tobacco comment: Pt quit smoking 3 weeks ago.   Vaping Use  . Vaping Use: Never used  Substance and Sexual Activity  . Alcohol use: Yes    Comment: rarely  . Drug use: No  . Sexual activity: Never  Other Topics Concern  . Not on file  Social History Narrative  . Not on file   Social Determinants of Health   Financial Resource  Strain:   . Difficulty of Paying Living Expenses: Not on file  Food Insecurity:   . Worried About Charity fundraiser in the Last Year: Not on file  . Ran Out of Food in the Last Year: Not on file  Transportation Needs:   . Lack of Transportation (Medical): Not on file  . Lack of Transportation (Non-Medical): Not on file  Physical Activity:   . Days of Exercise per Week: Not on file  . Minutes of Exercise per Session: Not on file  Stress:   . Feeling of Stress : Not on file  Social Connections:   . Frequency of Communication with Friends and Family: Not on file  . Frequency of Social Gatherings with Friends and Family: Not on file  . Attends Religious Services: Not on file  . Active Member of Clubs or Organizations: Not on file  . Attends Archivist Meetings: Not on file  . Marital Status: Not on file  Intimate Partner Violence:   . Fear of Current or Ex-Partner: Not on file  . Emotionally  Abused: Not on file  . Physically Abused: Not on file  . Sexually Abused: Not on file     Allergies  Allergen Reactions  . Iodine Shortness Of Breath  . Lasix [Furosemide] Itching  . Rosuvastatin Other (See Comments)    Muscle Pain in Thighs  . Shellfish Allergy Hives  . Sulfa Antibiotics Nausea Only  . Latex Rash  . Lisinopril Cough  . Tramadol Itching     Outpatient Medications Prior to Visit  Medication Sig Dispense Refill  . acetaminophen (TYLENOL) 500 MG tablet Take 1,000-1,500 mg by mouth every 8 (eight) hours as needed for moderate pain.    [name] albuterol (VENTOLIN HFA) 108 (90 Base) MCG/ACT inhaler Inhale 1-2 puffs into the lungs every 6 (six) hours as needed for wheezing or shortness of breath. 18 g 0  . Cholecalciferol (VITAMIN D3) 250 MCG (10000 UT) capsule Take 10,000 Units by mouth daily.    [name] EPINEPHrine (EPIPEN 2-PAK) 0.3 mg/0.3 mL IJ SOAJ injection Inject 0.3 mg into the muscle as needed for anaphylaxis.     [name] estradiol-norethindrone (ACTIVELLA) 1-0.5 MG tablet Take 1 tablet by mouth daily. (Patient taking differently: Take 1 tablet by mouth every other day. ) 84 tablet 3  . ezetimibe (ZETIA) 10 MG tablet TAKE 1 TABLET EVERY DAY 90 tablet 3  . levothyroxine (SYNTHROID) 137 MCG tablet TAKE 1 TABLET (137 MCG TOTAL) BY MOUTH AT BEDTIME. 90 tablet 3  . lidocaine-prilocaine (EMLA) cream Apply 1 application topically as needed. 30 g 0  . losartan-hydrochlorothiazide (HYZAAR) 50-12.5 MG tablet TAKE 1 TABLET EVERY DAY 90 tablet 3  . Multiple Vitamin (MULTIVITAMIN WITH MINERALS) TABS tablet Take 1 tablet by mouth daily.    . Probiotic CAPS Take 1 capsule by mouth daily as needed (when taking antibiotics).    . simvastatin (ZOCOR) 10 MG tablet TAKE 1 TABLET (10 MG TOTAL) BY MOUTH 2 (TWO) TIMES A WEEK. (Patient taking differently: Take 10 mg by mouth 2 (two) times a week. Saturday and Sunday) 26 tablet 3  . TURMERIC CURCUMIN PO Take 2,250 mg by mouth daily.    . Vitamin D,  Ergocalciferol, (DRISDOL) 1.25 MG (50000 UNIT) CAPS capsule Take 1 capsule (50,000 Units total) by mouth every 7 (seven) days. (Patient taking differently: Take 50,000 Units by mouth every Thursday. ) 4 capsule 0  . ZINC-VITAMIN C PO Take 1 tablet by mouth daily. With rose hips    .  ondansetron (ZOFRAN) 4 MG tablet Take 1 tablet (4 mg total) by mouth daily as needed for nausea or vomiting. (Patient not taking: Reported on 06/03/2020) 30 tablet 1  . oxybutynin (DITROPAN) 5 MG tablet Take 1 tablet (5 mg total) by mouth every 8 (eight) hours as needed for bladder spasms. 30 tablet 1  . phenazopyridine (PYRIDIUM) 200 MG tablet Take 1 tablet (200 mg total) by mouth 3 (three) times daily as needed (for pain with urination). 30 tablet 0  . prochlorperazine (COMPAZINE) 10 MG tablet Take 1 tablet (10 mg total) by mouth every 6 (six) hours as needed for nausea or vomiting. (Patient not taking: Reported on 06/03/2020) 30 tablet 0  . XIIDRA 5 % SOLN Place 1 drop into both eyes daily as needed (dry eyes).  (Patient not taking: Reported on 06/03/2020)     No facility-administered medications prior to visit.         Objective:   Physical Exam Vitals:   06/03/20 1453  BP: 132/80  Pulse: (!) 56  Temp: 97.7 F (36.5 C)  TempSrc: Temporal  SpO2: 98%  Weight: 248 lb 12.8 oz (112.9 kg)  Height: 5\' 6"  (1.676 m)   Gen: Pleasant, overwt, in no distress,  normal affect  ENT: No lesions,  mouth clear,  oropharynx clear, no postnasal drip  Neck: No JVD, no stridor  Lungs: No use of accessory muscles, scattered rhonchi  Cardiovascular: RRR, heart sounds normal, no murmur or gallops, no peripheral edema  Musculoskeletal: No deformities, no cyanosis or clubbing  Neuro: alert, awake, non focal  Skin: Warm, no lesions or rash     Assessment & Plan:  Pulmonary nodule 1.3 cm right upper lobe pulmonary nodule that is hypermetabolic by PET.  There is another 5 mm right upper lobe nodule of unclear  significance.  The larger nodule could reflect metastatic disease, could reflect primary lung cancer.  Tissue diagnosis will impact therapeutic decisions.  I think she needs bronchoscopy, navigation for biopsies.  Discussed this with her today.  I will contact Dr. Alen Blew to discuss timing.  I may be able to do the bronchoscopy before she starts chemotherapy if he thinks that is best.  We reviewed your CT scan of the chest today. Recommend that we arrange for bronchoscopy with biopsies of your right upper lobe pulmonary nodule. Dr. Lamonte Sakai will discuss with Dr. Alen Blew so that we can determine most appropriate timing, either before or after your initial chemotherapy treatment. We will call you to solidify the timing for your procedure after reviewing with Dr. Alen Blew. Follow with Dr Lamonte Sakai in 1 month  Baltazar Apo, MD, PhD 06/03/2020, 5:20 PM [facility] [phone] or if no answer [phone]

## 2020-06-03 NOTE — Assessment & Plan Note (Signed)
1.3 cm right upper lobe pulmonary nodule that is hypermetabolic by PET.  There is another 5 mm right upper lobe nodule of unclear significance.  The larger nodule could reflect metastatic disease, could reflect primary lung cancer.  Tissue diagnosis will impact therapeutic decisions.  I think she needs bronchoscopy, navigation for biopsies.  Discussed this with her today.  I will contact Dr. Alen Blew to discuss timing.  I may be able to do the bronchoscopy before she starts chemotherapy if he thinks that is best.  We reviewed your CT scan of the chest today. Recommend that we arrange for bronchoscopy with biopsies of your right upper lobe pulmonary nodule. Dr. Lamonte Sakai will discuss with Dr. Alen Blew so that we can determine most appropriate timing, either before or after your initial chemotherapy treatment. We will call you to solidify the timing for your procedure after reviewing with Dr. Alen Blew. Follow with Dr Lamonte Sakai in 1 month

## 2020-06-03 NOTE — Patient Instructions (Signed)
We reviewed your CT scan of the chest today. Recommend that we arrange for bronchoscopy with biopsies of your right upper lobe pulmonary nodule. Dr. Lamonte Sakai will discuss with Dr. Alen Blew so that we can determine most appropriate timing, either before or after your initial chemotherapy treatment. We will call you to solidify the timing for your procedure after reviewing with Dr. Alen Blew. Follow with Dr Lamonte Sakai in 1 month

## 2020-06-04 ENCOUNTER — Other Ambulatory Visit: Payer: Self-pay | Admitting: Oncology

## 2020-06-04 ENCOUNTER — Ambulatory Visit (HOSPITAL_COMMUNITY)
Admission: RE | Admit: 2020-06-04 | Discharge: 2020-06-04 | Disposition: A | Discharge status: Home / Self Care | Payer: Medicare HMO | Source: Ambulatory Visit | Attending: Emergency Medicine | Admitting: Emergency Medicine

## 2020-06-04 ENCOUNTER — Encounter (HOSPITAL_COMMUNITY): Payer: Self-pay

## 2020-06-04 ENCOUNTER — Telehealth: Payer: Self-pay | Admitting: Emergency Medicine

## 2020-06-04 ENCOUNTER — Other Ambulatory Visit: Payer: Self-pay

## 2020-06-04 ENCOUNTER — Ambulatory Visit (HOSPITAL_COMMUNITY)
Admission: RE | Admit: 2020-06-04 | Discharge: 2020-06-04 | Disposition: A | Discharge status: Home / Self Care | Payer: Medicare HMO | Source: Ambulatory Visit | Attending: Oncology | Admitting: Oncology

## 2020-06-04 DIAGNOSIS — C661 Malignant neoplasm of right ureter: Secondary | ICD-10-CM | POA: Diagnosis not present

## 2020-06-04 DIAGNOSIS — Z79890 Hormone replacement therapy (postmenopausal): Secondary | ICD-10-CM | POA: Diagnosis not present

## 2020-06-04 DIAGNOSIS — Z888 Allergy status to other drugs, medicaments and biological substances status: Secondary | ICD-10-CM | POA: Insufficient documentation

## 2020-06-04 DIAGNOSIS — Z91040 Latex allergy status: Secondary | ICD-10-CM | POA: Diagnosis not present

## 2020-06-04 DIAGNOSIS — E039 Hypothyroidism, unspecified: Secondary | ICD-10-CM | POA: Diagnosis not present

## 2020-06-04 DIAGNOSIS — Z881 Allergy status to other antibiotic agents status: Secondary | ICD-10-CM | POA: Insufficient documentation

## 2020-06-04 DIAGNOSIS — R911 Solitary pulmonary nodule: Secondary | ICD-10-CM

## 2020-06-04 DIAGNOSIS — R590 Localized enlarged lymph nodes: Secondary | ICD-10-CM | POA: Diagnosis not present

## 2020-06-04 DIAGNOSIS — Z79899 Other long term (current) drug therapy: Secondary | ICD-10-CM | POA: Diagnosis not present

## 2020-06-04 DIAGNOSIS — Z882 Allergy status to sulfonamides status: Secondary | ICD-10-CM | POA: Diagnosis not present

## 2020-06-04 DIAGNOSIS — K219 Gastro-esophageal reflux disease without esophagitis: Secondary | ICD-10-CM | POA: Insufficient documentation

## 2020-06-04 DIAGNOSIS — C659 Malignant neoplasm of unspecified renal pelvis: Secondary | ICD-10-CM

## 2020-06-04 DIAGNOSIS — N1831 Chronic kidney disease, stage 3a: Secondary | ICD-10-CM | POA: Insufficient documentation

## 2020-06-04 DIAGNOSIS — I129 Hypertensive chronic kidney disease with stage 1 through stage 4 chronic kidney disease, or unspecified chronic kidney disease: Secondary | ICD-10-CM | POA: Insufficient documentation

## 2020-06-04 DIAGNOSIS — R7303 Prediabetes: Secondary | ICD-10-CM | POA: Insufficient documentation

## 2020-06-04 DIAGNOSIS — E785 Hyperlipidemia, unspecified: Secondary | ICD-10-CM | POA: Insufficient documentation

## 2020-06-04 DIAGNOSIS — R918 Other nonspecific abnormal finding of lung field: Secondary | ICD-10-CM

## 2020-06-04 DIAGNOSIS — Z452 Encounter for adjustment and management of vascular access device: Secondary | ICD-10-CM | POA: Diagnosis not present

## 2020-06-04 DIAGNOSIS — C689 Malignant neoplasm of urinary organ, unspecified: Secondary | ICD-10-CM | POA: Diagnosis not present

## 2020-06-04 DIAGNOSIS — Z87891 Personal history of nicotine dependence: Secondary | ICD-10-CM | POA: Diagnosis not present

## 2020-06-04 DIAGNOSIS — Z91013 Allergy to seafood: Secondary | ICD-10-CM | POA: Insufficient documentation

## 2020-06-04 HISTORY — PX: IR IMAGING GUIDED PORT INSERTION: IMG5740

## 2020-06-04 LAB — CBC WITH DIFFERENTIAL/PLATELET
Abs Immature Granulocytes: 0.03 10*3/uL (ref 0.00–0.07)
Basophils Absolute: 0 10*3/uL (ref 0.0–0.1)
Basophils Relative: 0 %
Eosinophils Absolute: 0.2 10*3/uL (ref 0.0–0.5)
Eosinophils Relative: 2 %
HCT: 41 % (ref 36.0–46.0)
Hemoglobin: 13.7 g/dL (ref 12.0–15.0)
Immature Granulocytes: 0 %
Lymphocytes Relative: 22 %
Lymphs Abs: 2 10*3/uL (ref 0.7–4.0)
MCH: 32 pg (ref 26.0–34.0)
MCHC: 33.4 g/dL (ref 30.0–36.0)
MCV: 95.8 fL (ref 80.0–100.0)
Monocytes Absolute: 0.8 10*3/uL (ref 0.1–1.0)
Monocytes Relative: 9 %
Neutro Abs: 5.7 10*3/uL (ref 1.7–7.7)
Neutrophils Relative %: 67 %
Platelets: 230 10*3/uL (ref 150–400)
RBC: 4.28 MIL/uL (ref 3.87–5.11)
RDW: 13.4 % (ref 11.5–15.5)
WBC: 8.7 10*3/uL (ref 4.0–10.5)
nRBC: 0 % (ref 0.0–0.2)

## 2020-06-04 LAB — PROTIME-INR
INR: 0.9 (ref 0.8–1.2)
Prothrombin Time: 11.7 seconds (ref 11.4–15.2)

## 2020-06-04 MED ORDER — LIDOCAINE-EPINEPHRINE 1 %-1:100000 IJ SOLN
INTRAMUSCULAR | Status: AC | PRN
  Administered 2020-06-04: 10 mL

## 2020-06-04 MED ORDER — SODIUM CHLORIDE 0.9 % IV SOLN: INTRAVENOUS | Status: DC

## 2020-06-04 MED ORDER — HEPARIN SOD (PORK) LOCK FLUSH 100 UNIT/ML IV SOLN
INTRAVENOUS | Status: AC | PRN
  Administered 2020-06-04: 500 [IU] via INTRAVENOUS

## 2020-06-04 MED ORDER — CLINDAMYCIN PHOSPHATE 900 MG/50ML IV SOLN
900.0000 mg | INTRAVENOUS | Status: AC
  Administered 2020-06-04: 900 mg via INTRAVENOUS

## 2020-06-04 MED ORDER — FENTANYL CITRATE (PF) 100 MCG/2ML IJ SOLN
INTRAMUSCULAR | Status: AC | PRN
  Administered 2020-06-04 (×2): 50 ug via INTRAVENOUS

## 2020-06-04 MED ORDER — LIDOCAINE HCL 1 % IJ SOLN
INTRAMUSCULAR | Status: AC
  Filled 2020-06-04: qty 20

## 2020-06-04 MED ORDER — FENTANYL CITRATE (PF) 100 MCG/2ML IJ SOLN
INTRAMUSCULAR | Status: AC
  Filled 2020-06-04: qty 2

## 2020-06-04 MED ORDER — LIDOCAINE-EPINEPHRINE 1 %-1:100000 IJ SOLN
INTRAMUSCULAR | Status: AC
  Filled 2020-06-04: qty 1

## 2020-06-04 MED ORDER — CEFAZOLIN SODIUM-DEXTROSE 2-4 GM/100ML-% IV SOLN: 2.0000 g | INTRAVENOUS | Status: DC

## 2020-06-04 MED ORDER — HEPARIN SOD (PORK) LOCK FLUSH 100 UNIT/ML IV SOLN
INTRAVENOUS | Status: AC
  Filled 2020-06-04: qty 5

## 2020-06-04 MED ORDER — MIDAZOLAM HCL 2 MG/2ML IJ SOLN
INTRAMUSCULAR | Status: AC | PRN
  Administered 2020-06-04 (×4): 1 mg via INTRAVENOUS

## 2020-06-04 MED ORDER — MIDAZOLAM HCL 2 MG/2ML IJ SOLN
INTRAMUSCULAR | Status: AC
  Filled 2020-06-04: qty 4

## 2020-06-04 MED ORDER — CLINDAMYCIN PHOSPHATE 900 MG/50ML IV SOLN
INTRAVENOUS | Status: AC
  Filled 2020-06-04: qty 50

## 2020-06-04 NOTE — Telephone Encounter (Signed)
I spoke to the patient & made her aware that Dr. Alen Blew would like to postpone her chemo on Friday to a later date.  Pt would like to know when it is being deferred to & if her next session scheduled on 10/22 will be deferred as well.  I let the patient know that we will notify her of the changes.  Dr. Lamonte Sakai - when you notify Dr. Alen Blew, will you please a  ENB is scheduled for 10/15 @ 12 at [place] [facility].  Case # is [id number] PHQNE test is 10/14 @ 12 Working on scheduling Super D [place] for tomorrow.   Pt given my direct number with questions & okay'd me to speak w/ her daughter about anything this afternoon.

## 2020-06-04 NOTE — Procedures (Signed)
Interventional Radiology Procedure:   Indications: Urolithelial carcinoma  Procedure: Port placement  Findings: Right jugular port, tip at SVC/RA junction  Complications: None     EBL: Minimal, less than 10 ml  Plan: Discharge in one hour.  Keep port site and incisions dry for at least 24 hours.     Abenezer Odonell R. Anselm Pancoast, MD  Pager: [phone]

## 2020-06-04 NOTE — Discharge Instructions (Signed)

## 2020-06-04 NOTE — Addendum Note (Signed)
Addended by: Gavin Potters R on: 06/04/2020 11:41 AM   Modules accepted: Orders

## 2020-06-04 NOTE — Telephone Encounter (Signed)
Noted  

## 2020-06-04 NOTE — Telephone Encounter (Signed)
Pt did complete the Super D CT at [facility] today after her appt w/ IR this afternoon.

## 2020-06-04 NOTE — H&P (Signed)
Chief Complaint: Patient was seen in consultation today for port-a-catheter placement  Referring Physician(s): [name]  Supervising Physician: Markus Daft  Patient Status: [facility] - Inpatient - Out-pt  History of Present Illness: Maria Marquez is a 74 y.o. 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 She presented to her PCP mid-July 2021 with complaints of urinary frequency and gross hematuria. CT imaging on 03/19/20 showed a soft tissue mass in the proximal right ureter concerning for a neoplasm and she was referred to urology. On 04/18/20 she underwent a cystoscopy with right ureteroscopy and biopsy of the right renal pelvis mass; pathology positive for urothelial carcinoma. Additional work up during this time also revealed lung nodules concerning for metastatic disease or possibly primary lung cancer. She has a pending bronchoscopy and will also begin chemotherapy 06/06/20.   Interventional Radiology has been asked to evaluate this patient for an image-guided port-a-catheter placement to facilitate her treatment plans.   Past Medical History:  Diagnosis Date  . Acute blood loss anemia   . Arthritis   . Back pain   . Bilious vomiting with nausea   . 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 Chronic kidney disease, stage 3a ([place]) 12/31/2019  . Complication of anesthesia   . Constipation   . Dyspnea    On exertion and rest at times  . Gastroesophageal reflux disease without esophagitis 06/17/2015  . GERD (gastroesophageal reflux disease)   . HLD (hyperlipidemia)   . Hypertension   . Hypothyroid   . Joint pain   . Leg edema   . Leukocytosis   . Multiple food allergies   . Myalgia due to statin 11/23/2018  . Osteoarthritis of hip    Right  . Pneumonia   . PONV (postoperative nausea and vomiting)   . Postmenopausal hormone replacement therapy 06/17/2015  .  Pre-diabetes   . Prediabetes   . Sessile colonic polyp 10/04/2017   Colonoscopy 12/2015, Dr. Fuller Plan, repeat q 5 years.    Past Surgical History:  Procedure Laterality Date  . CYSTOSCOPY WITH URETEROSCOPY Right 04/18/2020   Procedure: CYSTOSCOPY WITH URETEROSCOPY, BIOPSY;  Surgeon: Ceasar Mons, MD;  Location: [facility];  Service: Urology;  Laterality: Right;  ONLY NEEDS 45 MIN  . EYE SURGERY  1987  . HERNIA REPAIR    . THYROID LOBECTOMY     right side  . TOTAL HIP ARTHROPLASTY Right 01/28/2016   Procedure: RIGHT TOTAL HIP ARTHROPLASTY ANTERIOR APPROACH;  Surgeon: [name], MD;  Location: [facility];  Service: Orthopedics;  Laterality: Right;    Allergies: Iodine, Keflex [cephalexin], Lasix [furosemide], Rosuvastatin, Shellfish allergy, Sulfa antibiotics, Latex, Lisinopril, and Tramadol  Medications: Prior to Admission medications   Medication Sig Start Date End Date Taking? Authorizing Provider  acetaminophen (TYLENOL) 500 MG tablet Take 1,000-1,500 mg by mouth every 8 (eight) hours as needed for moderate pain.    [provider]  albuterol (VENTOLIN HFA) 108 (90 Base) MCG/ACT inhaler Inhale 1-2 puffs into the lungs every 6 (six) hours as needed for wheezing or shortness of breath. 04/18/19   [name] D, MD  Cholecalciferol (VITAMIN D3) 250 MCG (10000 UT) capsule Take 10,000 Units by mouth daily.    [provider]  EPINEPHrine (EPIPEN 2-PAK) 0.3 mg/0.3 mL IJ SOAJ injection Inject 0.3 mg into the muscle as needed for anaphylaxis.  04/30/16   [provider]  estradiol-norethindrone (  ACTIVELLA) 1-0.5 MG tablet Take 1 tablet by mouth daily. Patient taking differently: Take 1 tablet by mouth every other day.  06/26/19   Leamon Arnt, MD  ezetimibe (ZETIA) 10 MG tablet TAKE 1 TABLET EVERY DAY 10/08/19   Leamon Arnt, MD  levothyroxine (SYNTHROID) 137 MCG tablet TAKE 1 TABLET (137 MCG TOTAL) BY MOUTH AT BEDTIME. 10/08/19   Leamon Arnt, MD   lidocaine-prilocaine (EMLA) cream Apply 1 application topically as needed. 05/26/20   [name], MD  losartan-hydrochlorothiazide [facility]) 50-12.5 MG tablet TAKE 1 TABLET EVERY DAY 05/08/20   Leamon Arnt, MD  Multiple Vitamin (MULTIVITAMIN WITH MINERALS) TABS tablet Take 1 tablet by mouth daily.    [provider]  ondansetron (ZOFRAN) 4 MG tablet Take 1 tablet (4 mg total) by mouth daily as needed for nausea or vomiting. Patient not taking: Reported on 06/03/2020 04/18/20 04/18/21  Ceasar Mons, MD  oxybutynin (DITROPAN) 5 MG tablet Take 1 tablet (5 mg total) by mouth every 8 (eight) hours as needed for bladder spasms. 04/18/20   Ceasar Mons, MD  phenazopyridine (PYRIDIUM) 200 MG tablet Take 1 tablet (200 mg total) by mouth 3 (three) times daily as needed (for pain with urination). 04/18/20 04/18/21  Ceasar Mons, MD  Probiotic CAPS Take 1 capsule by mouth daily as needed (when taking antibiotics).    [provider]  prochlorperazine (COMPAZINE) 10 MG tablet Take 1 tablet (10 mg total) by mouth every 6 (six) hours as needed for nausea or vomiting. Patient not taking: Reported on 06/03/2020 05/26/20   [name], MD  simvastatin (ZOCOR) 10 MG tablet TAKE 1 TABLET (10 MG TOTAL) BY MOUTH 2 (TWO) TIMES A WEEK. Patient taking differently: Take 10 mg by mouth 2 (two) times a week. Saturday and Sunday 01/24/20   Leamon Arnt, MD  TURMERIC CURCUMIN PO Take 2,250 mg by mouth daily.    [provider]  Vitamin D, Ergocalciferol, (DRISDOL) 1.25 MG (50000 UNIT) CAPS capsule Take 1 capsule (50,000 Units total) by mouth every 7 (seven) days. Patient taking differently: Take 50,000 Units by mouth every Thursday.  03/05/20   [name] D, MD  XIIDRA 5 % SOLN Place 1 drop into both eyes daily as needed (dry eyes).  Patient not taking: Reported on 06/03/2020 03/01/20   [provider]  ZINC-VITAMIN C PO Take 1 tablet by mouth  daily. With rose hips    [provider]     Family History  Problem Relation Age of Onset  . Arthritis Mother   . Breast cancer Mother   . Schizophrenia Mother   . Diabetes Father   . Heart disease Father   . Kidney disease Father   . Stroke Sister   . Lung cancer Brother   . Heart disease Brother   . Lung cancer Brother   . Heart disease Brother   . Vision loss Brother        Glaucoma  . Alcohol abuse Brother     Social History   Socioeconomic History  . Marital status: Widowed    Spouse name: Not on file  . Number of children: Not on file  . Years of education: Not on file  . Highest education level: Not on file  Occupational History  . Occupation: Retired  Tobacco Use  . Smoking status: Former Smoker    Packs/day: 1.00    Years: 45.00    Pack years: 45.00  Types: Cigarettes    Quit date: 04/18/2020    Years since quitting: 0.1  . Smokeless tobacco: Former Systems developer  . Tobacco comment: Pt quit smoking 3 weeks ago.   Vaping Use  . Vaping Use: Never used  Substance and Sexual Activity  . Alcohol use: Yes    Comment: rarely  . Drug use: No  . Sexual activity: Never  Other Topics Concern  . Not on file  Social History Narrative  . Not on file   Social Determinants of Health   Financial Resource Strain:   . Difficulty of Paying Living Expenses: Not on file  Food Insecurity:   . Worried About Charity fundraiser in the Last Year: Not on file  . Ran Out of Food in the Last Year: Not on file  Transportation Needs:   . Lack of Transportation (Medical): Not on file  . Lack of Transportation (Non-Medical): Not on file  Physical Activity:   . Days of Exercise per Week: Not on file  . Minutes of Exercise per Session: Not on file  Stress:   . Feeling of Stress : Not on file  Social Connections:   . Frequency of Communication with Friends and Family: Not on file  . Frequency of Social Gatherings with Friends and Family: Not on file  . Attends Religious  Services: Not on file  . Active Member of Clubs or Organizations: Not on file  . Attends Archivist Meetings: Not on file  . Marital Status: Not on file    Review of Systems: A 12 point ROS discussed and pertinent positives are indicated in the HPI above.  All other systems are negative.  Review of Systems  Constitutional: Negative for appetite change and fatigue.  Respiratory: Negative for cough and shortness of breath.   Cardiovascular: Positive for leg swelling. Negative for chest pain.  Gastrointestinal: Positive for nausea. Negative for abdominal pain, diarrhea and vomiting.       Occasional nausea  Musculoskeletal: Negative for back pain.  Neurological: Negative for headaches.    Vital Signs: BP (!) 153/73   Pulse (!) 57   Temp 97.8 F (36.6 C) (Oral)   Resp 20   SpO2 100%   Physical Exam Constitutional:      General: She is not in acute distress.    Appearance: She is obese.  HENT:     Mouth/Throat:     Mouth: Mucous membranes are moist.     Pharynx: Oropharynx is clear.  Cardiovascular:     Rate and Rhythm: Normal rate and regular rhythm.     Pulses: Normal pulses.     Heart sounds: Normal heart sounds.  Pulmonary:     Effort: Pulmonary effort is normal.     Breath sounds: Normal breath sounds.  Abdominal:     General: Bowel sounds are normal.     Palpations: Abdomen is soft.  Musculoskeletal:        General: Normal range of motion.     Right lower leg: Edema present.     Left lower leg: Edema present.  Skin:    General: Skin is warm and dry.  Neurological:     Mental Status: She is alert and oriented to person, place, and time.  Psychiatric:        Mood and Affect: Mood normal.        Behavior: Behavior normal.        Thought Content: Thought content normal.  Judgment: Judgment normal.     Imaging: NM PET Image Initial (PI) Skull Base To Thigh  Result Date: 06/02/2020 CLINICAL DATA:  Initial treatment strategy for urothelial  neoplasm. History of RIGHT-sided urothelial lesion discovered on hematuria assessment. EXAM: NUCLEAR MEDICINE PET SKULL BASE TO THIGH TECHNIQUE: 12.2 mCi F-18 FDG was injected intravenously. Full-ring PET imaging was performed from the skull base to thigh after the radiotracer. CT data was obtained and used for attenuation correction and anatomic localization. Fasting blood glucose: 110 mg/dl COMPARISON:  March 19, 2020 and CT of the chest of 05/14/2020 FINDINGS: Mediastinal blood pool activity: SUV max 3.31 Liver activity: SUV max NA NECK: Asymmetric fullness at the base of LEFT tongue in the glossal a palatine fossa some artifact in this area, also with asymmetric apparent FDG uptake (image 27 of series 3 and series 4 with maximum SUV of 6.0. No hypermetabolic lymph nodes in the neck Incidental CT findings: none CHEST: RIGHT suprahilar mass with spiculated margins (image 61, series 4) this measures approximately 1.6 x 1.3 cm though with limited assessment in terms of size due to motion on the current study when measured in a similar fashion on the prior exam this measured approximately 1.3 x 1.3 cm (SUVmax = 11.1) RIGHT upper lobe nodule just along the major fissure measuring 5 mm is unchanged and not associated with increased FDG uptake Signs of RIGHT thyroidectomy Scattered small lymph nodes throughout the chest none with activity significantly above that of mediastinal blood pool largest approximately 8 mm along the RIGHT paratracheal chain with a maximum SUV of 3.9, nonspecific. Incidental CT findings: Calcified atheromatous plaque of the thoracic aorta. Normal heart size. No pericardial effusion. No effusion. No consolidation. Pulmonary lesions as described. Airways are patent. ABDOMEN/PELVIS: No abnormal hypermetabolic activity within the liver, pancreas, adrenal glands, or spleen. No hypermetabolic lymph nodes in the abdomen or pelvis. Known urothelial lesion is likely diminished in size and ureter not well  assessed as outlined below on FDG given excretion of FDG in the urinary tract Incidental CT findings: Low-density lesion in the LEFT hemi liver similar to the prior study likely a small cyst. Cholelithiasis. No peripancreatic stranding. Normal size spleen. Adrenal glands are unremarkable. No hydronephrosis. Ureteral dilation is diminished compared to the prior study. Ureteral assessment on FDG portion of the exam limited due to excretion of FDG into the ureters. Small hiatal hernia. No acute gastrointestinal process. The appendix is normal. Atheromatous plaque in the abdominal aorta tracking into the iliac vasculature. There is no gastrohepatic or hepatoduodenal ligament lymphadenopathy. No retroperitoneal or mesenteric lymphadenopathy. Moderate size fat containing umbilical hernia similar to the prior exam. No ascites. SKELETON: No focal hypermetabolic activity to suggest skeletal metastasis. Incidental CT findings: RIGHT hip arthroplasty. Spinal degenerative changes. IMPRESSION: 1. RIGHT upper lobe lesion with spiculated margins suspicious for primary pulmonary neoplasm, this is favored based on location and morphology over a metastatic process. Bronchoscopic assessment is suggested for further evaluation. 2. Mildly increased metabolic activity associated with RIGHT paratracheal lymph nodes though not significantly above mediastinal blood pool. This could be assessed further on bronchoscopic evaluation. 3. Asymmetry of the base of the tongue on the LEFT as compared to the RIGHT with mild increased asymmetric FDG uptake. Consider direct visualization for further assessment. No adenopathy in the neck. 4. Postoperative changes of RIGHT thyroidectomy Electronically Signed   By: Zetta Bills M.D.   On: 06/02/2020 13:00    Labs:  CBC: Recent Labs    04/03/20 0000 04/15/20 1501  06/02/20 0738 06/04/20 1209  WBC 11.9 10.3 8.7 8.7  HGB 13.5 13.1 13.3 13.7  HCT 41 39.6 40.2 41.0  PLT 238 229 220 230     COAGS: Recent Labs    06/04/20 1209  INR 0.9    BMP: Recent Labs    09/17/19 1321 12/19/19 1132 12/31/19 1501 03/10/20 1608 04/15/20 1501 06/02/20 0738  NA   < > 140 139 139 138 141  K   < > 3.8 3.9 4.1 4.1 4.3  CL   < > 103 101 103 102 105  CO2   < > 22 28 31 28 28   GLUCOSE   < > 104* 104* 92 107* 115*  BUN   < > 25 25* 23 19 18   CALCIUM   < > 9.1 9.2 9.0 9.0 9.4  CREATININE   < > 1.38* 1.13 1.39* 1.22* 1.34*  GFRNONAA  --  38*  --   --  44* 39*  GFRAA  --  44*  --   --  51*  --    < > = values in this interval not displayed.    LIVER FUNCTION TESTS: Recent Labs    12/19/19 1132 12/31/19 1501 03/10/20 1608 06/02/20 0738  BILITOT 0.8 0.8 0.6 1.0  AST 12 11 16  12*  ALT 16 13 23 17   ALKPHOS 70 62 62 67  PROT 6.1 6.3 6.8 6.6  ALBUMIN 4.1 4.0 4.1 3.5    TUMOR MARKERS: No results for input(s): AFPTM, CEA, CA199, CHROMGRNA in the last 8760 hours.  Assessment and Plan:  Urothelial carcinoma with possible metastases; possible lung cancer: Maria Marquez, 74 year old female, presents today to the [facility] department for an image-guided port-a-catheter placement.   Risks and benefits of an image-guided port-a-catheter placement were discussed with the patient including, but not limited to bleeding, infection, pneumothorax, or fibrin sheath development and need for additional procedures.  All of the patient's questions were answered, patient is agreeable to proceed.  Maria Marquez has been NPO. Labs and vitals have been reviewed. She does not take any blood-thinning medications.   Consent signed and in chart.  Thank you for this interesting consult.  I greatly enjoyed meeting Maria Marquez and look forward to participating in their care.  A copy of this report was sent to the requesting provider on this date.  Electronically Signed: Soyla Dryer, AGACNP-BC [phone] 06/04/2020, 1:08 PM   I spent a total of  30 Minutes    in face to face in clinical consultation, greater than 50% of which was counseling/coordinating care for port-a-catheter placement.

## 2020-06-04 NOTE — Telephone Encounter (Signed)
Let Her know that I was able to speak with Dr. Alen Blew.  He would like to try to defer her chemotherapy on Friday 10/15 if we were able to get her bronchoscopy done on that same day.  Dr. Lamonte Sakai is going to work on scheduling and she will be contacted.

## 2020-06-04 NOTE — Telephone Encounter (Addendum)
Spoke to [facility] w/ [facility] [place] - provided prior auth # [id number] from 06/04/20-07/04/20.  Pt will have CT this afternoon.   I left a detailed VM on pt's regarding this appointment.

## 2020-06-04 NOTE — Telephone Encounter (Signed)
Maria Marquez CT checking to see if patient's Super D needs to be prior authorized. Tedra Coupe phone number is [phone].

## 2020-06-04 NOTE — Telephone Encounter (Signed)
Thank you :)

## 2020-06-05 ENCOUNTER — Other Ambulatory Visit (HOSPITAL_COMMUNITY)
Admission: RE | Admit: 2020-06-05 | Discharge: 2020-06-05 | Disposition: A | Discharge status: Home / Self Care | Payer: Medicare HMO | Source: Ambulatory Visit | Attending: Emergency Medicine | Admitting: Emergency Medicine

## 2020-06-05 ENCOUNTER — Encounter (HOSPITAL_COMMUNITY): Payer: Self-pay | Admitting: Emergency Medicine

## 2020-06-05 ENCOUNTER — Other Ambulatory Visit: Payer: Self-pay

## 2020-06-05 ENCOUNTER — Telehealth: Payer: Self-pay | Admitting: Oncology

## 2020-06-05 DIAGNOSIS — Z20822 Contact with and (suspected) exposure to covid-19: Secondary | ICD-10-CM | POA: Diagnosis not present

## 2020-06-05 DIAGNOSIS — Z01812 Encounter for preprocedural laboratory examination: Secondary | ICD-10-CM | POA: Insufficient documentation

## 2020-06-05 LAB — SARS CORONAVIRUS 2 (TAT 6-24 HRS): SARS Coronavirus 2: NEGATIVE

## 2020-06-05 NOTE — Telephone Encounter (Signed)
R/s appt per 10/13 sch msg - pt is aware of appts changed

## 2020-06-05 NOTE — Progress Notes (Signed)
PCP - Billey Merril Nagy, MD Cardiologist - pt denies    Chest x-ray - n/a EKG - 04/15/20 Stress Test - pt denies ECHO - pt denies Cardiac Cath - pt denies  Blood Thinner Instructions: n/a Aspirin Instructions: n/a   COVID TEST- 06/05/20  Coronavirus Screening  Have you experienced the following symptoms:  Cough yes/no: No Fever (>100.81F)  yes/no: No Runny nose yes/no: No Sore throat yes/no: No Difficulty breathing/shortness of breath  yes/no: No  Have you or a family member traveled in the last 14 days and where? yes/no: No   If the patient indicates "YES" to the above questions, their PAT will be rescheduled to limit the exposure to others and, the surgeon will be notified. THE PATIENT WILL NEED TO BE ASYMPTOMATIC FOR 14 DAYS.   If the patient is not experiencing any of these symptoms, the PAT nurse will instruct them to NOT bring anyone with them to their appointment since they may have these symptoms or traveled as well.   Please remind your patients and families that hospital visitation restrictions are in effect and the importance of the restrictions.     Anesthesia review: n/a   -------------  SDW INSTRUCTIONS:  Your procedure is scheduled on 06/06/20 .  Report to [facility] Main Entrance "A" at 09:30 A.M., and check in at the Admitting office.  Call this number if you have problems the morning of surgery: [phone]   Remember: Do not eat or drink after midnight the night before your surgery   Take these medicines the morning of surgery with A SIP OF WATER: Tylenol - as needed Albuterol inhaler - as needed -- Please bring all inhalers with you the day of surgery.  Epi-pen - as needed estradiol-norethindrone (ACTIVELLA)  ezetimibe (ZETIA) levothyroxine (SYNTHROID)  ondansetron (ZOFRAN) - if needed  As of today, STOP taking any Aspirin (unless otherwise instructed by your surgeon), Aleve, Naproxen, Ibuprofen, Motrin, Advil, Goody's, BC's, all herbal  medications, fish oil, and all vitamins.    The Morning of Surgery  Do not wear jewelry, make-up or nail polish.  Do not wear lotions, powders, or perfumes, or deodorant  Do not shave 48 hours prior to surgery.    Do not bring valuables to the hospital.  [facility] is not responsible for any belongings or valuables.  If you are a smoker, DO NOT Smoke 24 hours prior to surgery  If you wear a CPAP at night please bring your mask the morning of surgery   Remember that you must have someone to transport you home after your surgery, and remain with you for 24 hours if you are discharged the same day.   Please bring cases for contacts, glasses, hearing aids, dentures or bridgework because it cannot be worn into surgery.    Leave your suitcase in the car.  After surgery it may be brought to your room.  For patients admitted to the hospital, discharge time will be determined by your treatment team.  Patients discharged the day of surgery will not be allowed to drive home.    Special instructions:   [place]- Preparing For Surgery  Oral Hygiene is also important to reduce your risk of infection.  Remember - BRUSH YOUR TEETH THE MORNING OF SURGERY WITH YOUR REGULAR TOOTHPASTE  Please follow these instructions carefully.   1. Shower the NIGHT BEFORE SURGERY and the MORNING OF SURGERY with DIAL Soap.   2. Wash thoroughly, paying special attention to the area where your surgery  will be performed.  3. Thoroughly rinse your body with warm water from the neck down.  4. Pat yourself dry with a CLEAN TOWEL.  5. Wear CLEAN PAJAMAS to bed the night before surgery  6. Place CLEAN SHEETS on your bed the night of your first shower and DO NOT SLEEP WITH PETS.  7. Wear comfortable clothes the morning of surgery.    Day of Surgery:  Please shower the morning of surgery with the CHG soap Do not apply any deodorants/lotions. Please wear clean clothes to the hospital/surgery center.    Remember to brush your teeth WITH YOUR REGULAR TOOTHPASTE.   Please read over the following fact sheets that you were given.  Patient denies shortness of breath, fever, cough and chest pain.

## 2020-06-06 ENCOUNTER — Ambulatory Visit (HOSPITAL_COMMUNITY): Payer: Medicare HMO

## 2020-06-06 ENCOUNTER — Inpatient Hospital Stay: Payer: Medicare HMO

## 2020-06-06 ENCOUNTER — Ambulatory Visit (HOSPITAL_COMMUNITY)
Admission: RE | Admit: 2020-06-06 | Discharge: 2020-06-06 | Disposition: A | Discharge status: Home / Self Care | Payer: Medicare HMO | Attending: Emergency Medicine | Admitting: Emergency Medicine

## 2020-06-06 ENCOUNTER — Encounter (HOSPITAL_COMMUNITY)
Admission: RE | Disposition: A | Discharge status: Home / Self Care | Payer: Self-pay | Source: Home / Self Care | Attending: Emergency Medicine

## 2020-06-06 ENCOUNTER — Other Ambulatory Visit: Payer: Self-pay

## 2020-06-06 ENCOUNTER — Ambulatory Visit: Admitting: Oncology

## 2020-06-06 ENCOUNTER — Ambulatory Visit (HOSPITAL_COMMUNITY): Payer: Medicare HMO | Admitting: Anesthesiology

## 2020-06-06 ENCOUNTER — Encounter (HOSPITAL_COMMUNITY): Payer: Self-pay | Admitting: Emergency Medicine

## 2020-06-06 DIAGNOSIS — Z91040 Latex allergy status: Secondary | ICD-10-CM | POA: Diagnosis not present

## 2020-06-06 DIAGNOSIS — I129 Hypertensive chronic kidney disease with stage 1 through stage 4 chronic kidney disease, or unspecified chronic kidney disease: Secondary | ICD-10-CM | POA: Diagnosis not present

## 2020-06-06 DIAGNOSIS — Z833 Family history of diabetes mellitus: Secondary | ICD-10-CM | POA: Insufficient documentation

## 2020-06-06 DIAGNOSIS — R7303 Prediabetes: Secondary | ICD-10-CM | POA: Insufficient documentation

## 2020-06-06 DIAGNOSIS — Z8261 Family history of arthritis: Secondary | ICD-10-CM | POA: Insufficient documentation

## 2020-06-06 DIAGNOSIS — E785 Hyperlipidemia, unspecified: Secondary | ICD-10-CM | POA: Insufficient documentation

## 2020-06-06 DIAGNOSIS — Z7951 Long term (current) use of inhaled steroids: Secondary | ICD-10-CM | POA: Insufficient documentation

## 2020-06-06 DIAGNOSIS — Z811 Family history of alcohol abuse and dependence: Secondary | ICD-10-CM | POA: Diagnosis not present

## 2020-06-06 DIAGNOSIS — Z83511 Family history of glaucoma: Secondary | ICD-10-CM | POA: Diagnosis not present

## 2020-06-06 DIAGNOSIS — Z882 Allergy status to sulfonamides status: Secondary | ICD-10-CM | POA: Insufficient documentation

## 2020-06-06 DIAGNOSIS — Z818 Family history of other mental and behavioral disorders: Secondary | ICD-10-CM | POA: Diagnosis not present

## 2020-06-06 DIAGNOSIS — C661 Malignant neoplasm of right ureter: Secondary | ICD-10-CM | POA: Diagnosis not present

## 2020-06-06 DIAGNOSIS — Z86010 Personal history of colonic polyps: Secondary | ICD-10-CM | POA: Diagnosis not present

## 2020-06-06 DIAGNOSIS — Z91013 Allergy to seafood: Secondary | ICD-10-CM | POA: Insufficient documentation

## 2020-06-06 DIAGNOSIS — E039 Hypothyroidism, unspecified: Secondary | ICD-10-CM | POA: Insufficient documentation

## 2020-06-06 DIAGNOSIS — Z801 Family history of malignant neoplasm of trachea, bronchus and lung: Secondary | ICD-10-CM | POA: Diagnosis not present

## 2020-06-06 DIAGNOSIS — Z87891 Personal history of nicotine dependence: Secondary | ICD-10-CM | POA: Insufficient documentation

## 2020-06-06 DIAGNOSIS — N1831 Chronic kidney disease, stage 3a: Secondary | ICD-10-CM | POA: Insufficient documentation

## 2020-06-06 DIAGNOSIS — Z823 Family history of stroke: Secondary | ICD-10-CM | POA: Diagnosis not present

## 2020-06-06 DIAGNOSIS — Z6839 Body mass index (BMI) 39.0-39.9, adult: Secondary | ICD-10-CM | POA: Insufficient documentation

## 2020-06-06 DIAGNOSIS — K219 Gastro-esophageal reflux disease without esophagitis: Secondary | ICD-10-CM | POA: Diagnosis not present

## 2020-06-06 DIAGNOSIS — Z803 Family history of malignant neoplasm of breast: Secondary | ICD-10-CM | POA: Insufficient documentation

## 2020-06-06 DIAGNOSIS — Z888 Allergy status to other drugs, medicaments and biological substances status: Secondary | ICD-10-CM | POA: Diagnosis not present

## 2020-06-06 DIAGNOSIS — R911 Solitary pulmonary nodule: Secondary | ICD-10-CM | POA: Diagnosis present

## 2020-06-06 DIAGNOSIS — E782 Mixed hyperlipidemia: Secondary | ICD-10-CM | POA: Diagnosis not present

## 2020-06-06 DIAGNOSIS — Z9889 Other specified postprocedural states: Secondary | ICD-10-CM

## 2020-06-06 DIAGNOSIS — R918 Other nonspecific abnormal finding of lung field: Secondary | ICD-10-CM | POA: Diagnosis not present

## 2020-06-06 DIAGNOSIS — Z419 Encounter for procedure for purposes other than remedying health state, unspecified: Secondary | ICD-10-CM

## 2020-06-06 DIAGNOSIS — Z79899 Other long term (current) drug therapy: Secondary | ICD-10-CM | POA: Insufficient documentation

## 2020-06-06 HISTORY — PX: VIDEO BRONCHOSCOPY WITH ENDOBRONCHIAL NAVIGATION: SHX6175

## 2020-06-06 LAB — GLUCOSE, CAPILLARY: Glucose-Capillary: 110 mg/dL — ABNORMAL HIGH (ref 70–99)

## 2020-06-06 SURGERY — VIDEO BRONCHOSCOPY WITH ENDOBRONCHIAL NAVIGATION
Anesthesia: General

## 2020-06-06 MED ORDER — ORAL CARE MOUTH RINSE: 15.0000 mL | Freq: Once | OROMUCOSAL | Status: AC

## 2020-06-06 MED ORDER — LIDOCAINE 2% (20 MG/ML) 5 ML SYRINGE
INTRAMUSCULAR | Status: AC
  Filled 2020-06-06: qty 5

## 2020-06-06 MED ORDER — AMISULPRIDE (ANTIEMETIC) 5 MG/2ML IV SOLN
10.0000 mg | Freq: Once | INTRAVENOUS | Status: AC | PRN
  Administered 2020-06-06: 10 mg via INTRAVENOUS

## 2020-06-06 MED ORDER — PROPOFOL 10 MG/ML IV BOLUS
INTRAVENOUS | Status: DC | PRN
  Administered 2020-06-06: 60 mg via INTRAVENOUS
  Administered 2020-06-06: 140 mg via INTRAVENOUS

## 2020-06-06 MED ORDER — MIDAZOLAM HCL 2 MG/2ML IJ SOLN
INTRAMUSCULAR | Status: DC | PRN
  Administered 2020-06-06: 2 mg via INTRAVENOUS

## 2020-06-06 MED ORDER — DEXAMETHASONE SODIUM PHOSPHATE 10 MG/ML IJ SOLN
INTRAMUSCULAR | Status: DC | PRN
  Administered 2020-06-06: 10 mg via INTRAVENOUS

## 2020-06-06 MED ORDER — FENTANYL CITRATE (PF) 250 MCG/5ML IJ SOLN
INTRAMUSCULAR | Status: DC | PRN
Start: 2020-06-06 — End: 2020-06-06
  Administered 2020-06-06 (×2): 75 ug via INTRAVENOUS

## 2020-06-06 MED ORDER — ROCURONIUM BROMIDE 10 MG/ML (PF) SYRINGE
PREFILLED_SYRINGE | INTRAVENOUS | Status: DC | PRN
  Administered 2020-06-06: 20 mg via INTRAVENOUS
  Administered 2020-06-06: 60 mg via INTRAVENOUS

## 2020-06-06 MED ORDER — SUGAMMADEX SODIUM 200 MG/2ML IV SOLN
INTRAVENOUS | Status: DC | PRN
  Administered 2020-06-06: 50 mg via INTRAVENOUS
  Administered 2020-06-06 (×3): 100 mg via INTRAVENOUS
  Administered 2020-06-06: 50 mg via INTRAVENOUS

## 2020-06-06 MED ORDER — EPHEDRINE SULFATE-NACL 50-0.9 MG/10ML-% IV SOSY
PREFILLED_SYRINGE | INTRAVENOUS | Status: DC | PRN
  Administered 2020-06-06 (×2): 10 mg via INTRAVENOUS

## 2020-06-06 MED ORDER — EPHEDRINE 5 MG/ML INJ
INTRAVENOUS | Status: AC
  Filled 2020-06-06: qty 10

## 2020-06-06 MED ORDER — SUGAMMADEX SODIUM 500 MG/5ML IV SOLN
INTRAVENOUS | Status: AC
  Filled 2020-06-06: qty 5

## 2020-06-06 MED ORDER — 0.9 % SODIUM CHLORIDE (POUR BTL) OPTIME
TOPICAL | Status: DC | PRN
  Administered 2020-06-06: 1000 mL

## 2020-06-06 MED ORDER — EPINEPHRINE PF 1 MG/ML IJ SOLN
INTRAMUSCULAR | Status: AC
  Filled 2020-06-06: qty 1

## 2020-06-06 MED ORDER — LACTATED RINGERS IV SOLN: INTRAVENOUS | Status: DC

## 2020-06-06 MED ORDER — AMISULPRIDE (ANTIEMETIC) 5 MG/2ML IV SOLN
INTRAVENOUS | Status: AC
  Filled 2020-06-06: qty 2

## 2020-06-06 MED ORDER — LACTATED RINGERS IV SOLN: INTRAVENOUS | Status: DC | PRN

## 2020-06-06 MED ORDER — FENTANYL CITRATE (PF) 250 MCG/5ML IJ SOLN
INTRAMUSCULAR | Status: AC
  Filled 2020-06-06: qty 5

## 2020-06-06 MED ORDER — ROCURONIUM BROMIDE 10 MG/ML (PF) SYRINGE
PREFILLED_SYRINGE | INTRAVENOUS | Status: AC
  Filled 2020-06-06: qty 10

## 2020-06-06 MED ORDER — PHENYLEPHRINE 40 MCG/ML (10ML) SYRINGE FOR IV PUSH (FOR BLOOD PRESSURE SUPPORT)
PREFILLED_SYRINGE | INTRAVENOUS | Status: DC | PRN
  Administered 2020-06-06 (×2): 80 ug via INTRAVENOUS
  Administered 2020-06-06: 120 ug via INTRAVENOUS
  Administered 2020-06-06 (×2): 80 ug via INTRAVENOUS

## 2020-06-06 MED ORDER — MIDAZOLAM HCL 2 MG/2ML IJ SOLN
INTRAMUSCULAR | Status: AC
  Filled 2020-06-06: qty 2

## 2020-06-06 MED ORDER — ONDANSETRON HCL 4 MG/2ML IJ SOLN
INTRAMUSCULAR | Status: DC | PRN
  Administered 2020-06-06: 4 mg via INTRAVENOUS

## 2020-06-06 MED ORDER — CHLORHEXIDINE GLUCONATE 0.12 % MT SOLN
15.0000 mL | Freq: Once | OROMUCOSAL | Status: AC
  Administered 2020-06-06: 15 mL via OROMUCOSAL
  Filled 2020-06-06: qty 15

## 2020-06-06 MED ORDER — DEXAMETHASONE SODIUM PHOSPHATE 10 MG/ML IJ SOLN
INTRAMUSCULAR | Status: AC
  Filled 2020-06-06: qty 1

## 2020-06-06 MED ORDER — PHENYLEPHRINE HCL-NACL 10-0.9 MG/250ML-% IV SOLN
INTRAVENOUS | Status: DC | PRN
  Administered 2020-06-06: 25 ug/min via INTRAVENOUS

## 2020-06-06 MED ORDER — PHENYLEPHRINE 40 MCG/ML (10ML) SYRINGE FOR IV PUSH (FOR BLOOD PRESSURE SUPPORT)
PREFILLED_SYRINGE | INTRAVENOUS | Status: AC
  Filled 2020-06-06: qty 10

## 2020-06-06 MED ORDER — LIDOCAINE 2% (20 MG/ML) 5 ML SYRINGE
INTRAMUSCULAR | Status: DC | PRN
  Administered 2020-06-06: 80 mg via INTRAVENOUS
  Administered 2020-06-06: 20 mg via INTRAVENOUS

## 2020-06-06 MED ORDER — ACETAMINOPHEN 500 MG PO TABS
1000.0000 mg | ORAL_TABLET | Freq: Once | ORAL | Status: AC
  Administered 2020-06-06: 1000 mg via ORAL
  Filled 2020-06-06: qty 2

## 2020-06-06 MED ORDER — FENTANYL CITRATE (PF) 100 MCG/2ML IJ SOLN: 25.0000 ug | INTRAMUSCULAR | Status: DC | PRN

## 2020-06-06 SURGICAL SUPPLY — 51 items
ADAPTER BRONCHOSCOPE OLYMP 190 (ADAPTER) ×2 IMPLANT
ADAPTER BRONCHOSCOPE OLYMPUS (ADAPTER) ×3 IMPLANT
ADAPTER VALVE BIOPSY EBUS (MISCELLANEOUS) IMPLANT
ADPR BSCP OLMPS EDG (ADAPTER) ×1
ADPR BSCP STRL LF REUSE (ADAPTER) ×1
ADPTR VALVE BIOPSY EBUS (MISCELLANEOUS)
BRUSH CYTOL CELLEBRITY 1.5X140 (MISCELLANEOUS) ×3 IMPLANT
BRUSH SUPERTRAX BIOPSY (INSTRUMENTS) IMPLANT
BRUSH SUPERTRAX NDL-TIP CYTO (INSTRUMENTS) ×3 IMPLANT
CANISTER SUCT 3000ML PPV (MISCELLANEOUS) ×3 IMPLANT
CNTNR URN SCR LID CUP LEK RST (MISCELLANEOUS) ×1 IMPLANT
CONT SPEC 4OZ STRL OR WHT (MISCELLANEOUS) ×3
COVER BACK TABLE 60X90IN (DRAPES) ×3 IMPLANT
COVER WAND RF STERILE (DRAPES) ×3 IMPLANT
FILTER STRAW FLUID ASPIR (MISCELLANEOUS) IMPLANT
FORCEPS BIOP 1.5 SINGLE USE (MISCELLANEOUS) ×3 IMPLANT
FORCEPS BIOP SUPERTRX PREMAR (INSTRUMENTS) ×3 IMPLANT
GAUZE SPONGE 4X4 12PLY STRL (GAUZE/BANDAGES/DRESSINGS) ×3 IMPLANT
GLOVE BIO SURGEON STRL SZ7.5 (GLOVE) ×6 IMPLANT
GLOVE BIOGEL PI IND STRL 6.5 (GLOVE) IMPLANT
GLOVE BIOGEL PI INDICATOR 6.5 (GLOVE) ×2
GLOVE SURG SYN 7.5  E (GLOVE) ×3
GLOVE SURG SYN 7.5 E (GLOVE) ×1 IMPLANT
GLOVE SURG SYN 7.5 PF PI (GLOVE) IMPLANT
GOWN STRL REUS W/ TWL LRG LVL3 (GOWN DISPOSABLE) ×2 IMPLANT
GOWN STRL REUS W/TWL LRG LVL3 (GOWN DISPOSABLE) ×6
KIT CLEAN ENDO COMPLIANCE (KITS) ×3 IMPLANT
KIT ILLUMISITE 180 PROCEDURE (KITS) ×2 IMPLANT
KIT ILLUMISITE 90 PROCEDURE (KITS) IMPLANT
KIT LOCATABLE GUIDE (CANNULA) IMPLANT
KIT MARKER FIDUCIAL DELIVERY (KITS) IMPLANT
KIT TURNOVER KIT B (KITS) ×3 IMPLANT
MARKER SKIN DUAL TIP RULER LAB (MISCELLANEOUS) ×3 IMPLANT
NDL SUPERTRX PREMARK BIOPSY (NEEDLE) ×1 IMPLANT
NEEDLE SUPERTRX PREMARK BIOPSY (NEEDLE) ×3 IMPLANT
NS IRRIG 1000ML POUR BTL (IV SOLUTION) ×3 IMPLANT
OIL SILICONE PENTAX (PARTS (SERVICE/REPAIRS)) ×3 IMPLANT
PAD ARMBOARD 7.5X6 YLW CONV (MISCELLANEOUS) ×6 IMPLANT
PATCHES PATIENT (LABEL) ×9 IMPLANT
SYR 20ML ECCENTRIC (SYRINGE) ×3 IMPLANT
SYR 20ML LL LF (SYRINGE) ×3 IMPLANT
SYR 50ML SLIP (SYRINGE) ×1 IMPLANT
TOWEL GREEN STERILE FF (TOWEL DISPOSABLE) ×3 IMPLANT
TRAP SPECIMEN MUCUS 40CC (MISCELLANEOUS) IMPLANT
TUBE CONNECTING 20'X1/4 (TUBING) ×1
TUBE CONNECTING 20X1/4 (TUBING) ×2 IMPLANT
UNDERPAD 30X36 HEAVY ABSORB (UNDERPADS AND DIAPERS) ×3 IMPLANT
VALVE BIOPSY  SINGLE USE (MISCELLANEOUS) ×3
VALVE BIOPSY SINGLE USE (MISCELLANEOUS) ×1 IMPLANT
VALVE SUCTION BRONCHIO DISP (MISCELLANEOUS) ×3 IMPLANT
WATER STERILE IRR 1000ML POUR (IV SOLUTION) ×3 IMPLANT

## 2020-06-06 NOTE — Interval H&P Note (Signed)
History and Physical Interval Note:  06/06/2020 11:50 AM  Maria Marquez  has presented today for surgery, with the diagnosis of PULMONARY NODULE.  The various methods of treatment have been discussed with the patient and family. After consideration of risks, benefits and other options for treatment, the patient has consented to  Procedure(s): [place] (N/A) as a surgical intervention.  The patient's history has been reviewed, patient examined, no change in status, stable for surgery.  I have reviewed the patient's chart and labs.  Questions were answered to the patient's satisfaction.     Collene Gobble

## 2020-06-06 NOTE — Anesthesia Procedure Notes (Signed)
Procedure Name: Intubation Date/Time: 06/06/2020 12:11 PM Performed by: Catalina Gravel, MD Pre-anesthesia Checklist: Patient identified, Emergency Drugs available, Suction available and Patient being monitored Patient Re-evaluated:Patient Re-evaluated prior to induction Oxygen Delivery Method: Circle System Utilized Preoxygenation: Pre-oxygenation with 100% oxygen Induction Type: IV induction Ventilation: Mask ventilation without difficulty and Two handed mask ventilation required Laryngoscope Size: Glidescope and 3 Grade View: Grade III Tube type: Oral Tube size: 8.5 mm Number of attempts: 3 Airway Equipment and Method: Stylet,  Oral airway,  Video-laryngoscopy and Bougie stylet Placement Confirmation: ETT inserted through vocal cords under direct vision,  positive ETCO2 and breath sounds checked- equal and bilateral Secured at: 23 cm Tube secured with: Tape Dental Injury: Teeth and Oropharynx as per pre-operative assessment  Difficulty Due To: Difficulty was unanticipated Future Recommendations: Recommend- induction with short-acting agent, and alternative techniques readily available Comments: Attempt x1 by CRNA, Grade 3 view and esophageal intubation.  Attempt x1 by MDA with bougie, grade 3, also esophageal intubation.  Glidescope blade #3 by MDA with Grade 3 view, ETT passed through cords, +EtCO2, +BBS.

## 2020-06-06 NOTE — Transfer of Care (Signed)
Immediate Anesthesia Transfer of Care Note  Patient: Maria Marquez  Procedure(s) Performed: VIDEO BRONCHOSCOPY WITH ENDOBRONCHIAL NAVIGATION (N/A )  Patient Location: PACU  Anesthesia Type:General  Level of Consciousness: awake, alert , oriented and patient cooperative  Airway & Oxygen Therapy: Patient Spontanous Breathing and Patient connected to face mask oxygen   Post-op Assessment: Report given to RN, Post -op Vital signs reviewed and stable and Patient moving all extremities  Post vital signs: Reviewed and stable  Last Vitals:  Vitals Value Taken Time  BP 162/69 06/06/20 1355  Temp    Pulse 72 06/06/20 1355  Resp 11 06/06/20 1355  SpO2 100 % 06/06/20 1355    Last Pain:  Vitals:   06/06/20 0949  TempSrc: Oral         Complications: No complications documented.

## 2020-06-06 NOTE — Anesthesia Postprocedure Evaluation (Signed)
Anesthesia Post Note  Patient: Maria Marquez  Procedure(s) Performed: VIDEO BRONCHOSCOPY WITH ENDOBRONCHIAL NAVIGATION (N/A )     Patient location during evaluation: PACU Anesthesia Type: General Level of consciousness: awake and alert Pain management: pain level controlled Vital Signs Assessment: post-procedure vital signs reviewed and stable Respiratory status: spontaneous breathing, nonlabored ventilation, respiratory function stable and patient connected to nasal cannula oxygen Cardiovascular status: blood pressure returned to baseline and stable Postop Assessment: no apparent nausea or vomiting Anesthetic complications: no   No complications documented.  Last Vitals:  Vitals:   06/06/20 1440 06/06/20 1455  BP: 131/60 (!) 148/70  Pulse: 66 65  Resp: 15 13  Temp:  (!) 36.2 C  SpO2: 96% 100%    Last Pain:  Vitals:   06/06/20 1420  TempSrc:   PainSc: 0-No pain                 Jacobi Nile COKER

## 2020-06-06 NOTE — Op Note (Signed)
Video Bronchoscopy with Electromagnetic Navigation Procedure Note  Date of Operation: 06/06/2020  Pre-op Diagnosis: Right upper lobe pulmonary nodule  Post-op Diagnosis: Same  Surgeon: Baltazar Apo  Assistants: None  Anesthesia: General endotracheal anesthesia  Operation: Flexible video fiberoptic bronchoscopy with electromagnetic navigation and biopsies.  Estimated Blood Loss: Minimal  Complications: None apparent  Indications and History: Maria Marquez is a 74 y.o. female with new diagnosis of urothelial cancer.  Part of her staging evaluation included a CT chest that revealed a small isolated perihilar right upper lobe nodule.  Unclear whether this might represent primary lung cancer versus metastatic disease.  Recommendation was made to achieve a tissue diagnosis via navigational bronchoscopy.  The risks, benefits, complications, treatment options and expected outcomes were discussed with the patient.  The possibilities of pneumothorax, pneumonia, reaction to medication, pulmonary aspiration, perforation of a viscus, bleeding, failure to diagnose a condition and creating a complication requiring transfusion or operation were discussed with the patient who freely signed the consent.    Description of Procedure: The patient was seen in the Preoperative Area, was examined and was deemed appropriate to proceed.  The patient was taken to the OR room 10, identified as [name] and the procedure verified as Flexible Video Fiberoptic Bronchoscopy.  A Time Out was held and the above information confirmed.   Prior to the date of the procedure a high-resolution CT scan of the chest was performed. Utilizing [place] a virtual tracheobronchial tree was generated to allow the creation of distinct navigation pathways to the patient's parenchymal abnormalities. After being taken to the operating room general anesthesia was initiated and the patient  was orally intubated. The video  fiberoptic bronchoscope was introduced via the endotracheal tube and a general inspection was performed which showed normal airways throughout. The extendable working channel and locator guide were introduced into the bronchoscope. The distinct navigation pathways prepared prior to this procedure were then utilized to navigate to within 0.5-1.5 cm of patient's lesion identified on CT scan. The extendable working channel was secured into place and the locator guide was withdrawn. Under fluoroscopic guidance transbronchial needle brushings, transbronchial Wang needle biopsies, and transbronchial forceps biopsies were performed to be sent for cytology and pathology. A bronchioalveolar lavage was performed in the right upper lobe and sent for cytology. At the end of the procedure a general airway inspection was performed and there was no evidence of active bleeding. The bronchoscope was removed.  The patient tolerated the procedure well. There was no significant blood loss and there were no obvious complications. A post-procedural chest x-ray is pending.  Samples: 1. Transbronchial needle brushings from right upper lobe nodule 2. Transbronchial Wang needle biopsies from right upper lobe nodule 3. Transbronchial forceps biopsies from right upper lobe nodule 4. Bronchoalveolar lavage from right upper lobe  Plans:  The patient will be discharged from the PACU to home when recovered from anesthesia and after chest x-ray is reviewed. We will review the cytology, pathology and microbiology results with the patient when they become available. Outpatient followup will be with her Nekeisha Aure and Dr. Alen Blew.    Evelene Roussin S. 06/06/2020

## 2020-06-06 NOTE — Anesthesia Preprocedure Evaluation (Addendum)
Anesthesia Evaluation  Patient identified by MRN, date of birth, ID band Patient awake    Reviewed: Allergy & Precautions, NPO status , Patient's Chart, lab work & pertinent test results  History of Anesthesia Complications (+) PONV and history of anesthetic complications  Airway Mallampati: II  TM Distance: >3 FB Neck ROM: Full    Dental  (+) Partial Upper, Partial Lower   Pulmonary former smoker,    breath sounds clear to auscultation       Cardiovascular hypertension, Pt. on medications  Rhythm:Regular Rate:Normal     Neuro/Psych PSYCHIATRIC DISORDERS Depression negative neurological ROS     GI/Hepatic Neg liver ROS, GERD  ,  Endo/Other  Hypothyroidism Morbid obesity  Renal/GU CRFRenal disease     Musculoskeletal  (+) Arthritis ,   Abdominal   Peds  Hematology negative hematology ROS (+)   Anesthesia Other Findings   Reproductive/Obstetrics                            Anesthesia Physical Anesthesia Plan  ASA: III  Anesthesia Plan: General   Post-op Pain Management:    Induction: Intravenous  PONV Risk Score and Plan: 3 and Dexamethasone, Ondansetron and Treatment may vary due to age or medical condition  Airway Management Planned: Oral ETT  Additional Equipment: None  Intra-op Plan:   Post-operative Plan: Extubation in OR  Informed Consent: I have reviewed the patients History and Physical, chart, labs and discussed the procedure including the risks, benefits and alternatives for the proposed anesthesia with the patient or authorized representative who has indicated his/her understanding and acceptance.       Plan Discussed with: CRNA  Anesthesia Plan Comments:        Anesthesia Quick Evaluation

## 2020-06-06 NOTE — Discharge Instructions (Signed)
General Anesthesia, Adult, Care After This sheet gives you information about how to care for yourself after your procedure. Your health care provider may also give you more specific instructions. If you have problems or questions, contact your health care provider. What can I expect after the procedure? After the procedure, the following side effects are common:  Pain or discomfort at the IV site.  Nausea.  Vomiting.  Sore throat.  Trouble concentrating.  Feeling cold or chills.  Weak or tired.  Sleepiness and fatigue.  Soreness and body aches. These side effects can affect parts of the body that were not involved in surgery. Follow these instructions at home:  For at least 24 hours after the procedure:  Have a responsible adult stay with you. It is important to have someone help care for you until you are awake and alert.  Rest as needed.  Do not: ? Participate in activities in which you could fall or become injured. ? Drive. ? Use heavy machinery. ? Drink alcohol. ? Take sleeping pills or medicines that cause drowsiness. ? Make important decisions or sign legal documents. ? Take care of children on your own. Eating and drinking  Follow any instructions from your health care provider about eating or drinking restrictions.  When you feel hungry, start by eating small amounts of foods that are soft and easy to digest (bland), such as toast. Gradually return to your regular diet.  Drink enough fluid to keep your urine pale yellow.  If you vomit, rehydrate by drinking water, juice, or clear broth. General instructions  If you have sleep apnea, surgery and certain medicines can increase your risk for breathing problems. Follow instructions from your health care provider about wearing your sleep device: ? Anytime you are sleeping, including during daytime naps. ? While taking prescription pain medicines, sleeping medicines, or medicines that make you drowsy.  Return to  your normal activities as told by your health care provider. Ask your health care provider what activities are safe for you.  Take over-the-counter and prescription medicines only as told by your health care provider.  If you smoke, do not smoke without supervision.  Keep all follow-up visits as told by your health care provider. This is important. Contact a health care provider if:  You have nausea or vomiting that does not get better with medicine.  You cannot eat or drink without vomiting.  You have pain that does not get better with medicine.  You are unable to pass urine.  You develop a skin rash.  You have a fever.  You have redness around your IV site that gets worse. Get help right away if:  You have difficulty breathing.  You have chest pain.  You have blood in your urine or stool, or you vomit blood. Summary  After the procedure, it is common to have a sore throat or nausea. It is also common to feel tired.  Have a responsible adult stay with you for the first 24 hours after general anesthesia. It is important to have someone help care for you until you are awake and alert.  When you feel hungry, start by eating small amounts of foods that are soft and easy to digest (bland), such as toast. Gradually return to your regular diet.  Drink enough fluid to keep your urine pale yellow.  Return to your normal activities as told by your health care provider. Ask your health care provider what activities are safe for you. This information is not  intended to replace advice given to you by your health care provider. Make sure you discuss any questions you have with your health care provider. Document Revised: 08/12/2017 Document Reviewed: 03/25/2017 Elsevier Patient Education  [place]. Flexible Bronchoscopy, Care After This sheet gives you information about how to care for yourself after your test. Your doctor may also give you more specific instructions. If you  have problems or questions, contact your doctor. Follow these instructions at home: Eating and drinking  Do not eat or drink anything (not even water) for 2 hours after your test, or until your numbing medicine (local anesthetic) wears off.  When your numbness is gone and your cough and gag reflexes have come back, you may: ? Eat only soft foods. ? Slowly drink liquids.  The day after the test, go back to your normal diet. Driving  Do not drive for 24 hours if you were given a medicine to help you relax (sedative).  Do not drive or use heavy machinery while taking prescription pain medicine. General instructions   Take over-the-counter and prescription medicines only as told by your doctor.  Return to your normal activities as told. Ask what activities are safe for you.  Do not use any products that have nicotine or tobacco in them. This includes cigarettes and e-cigarettes. If you need help quitting, ask your doctor.  Keep all follow-up visits as told by your doctor. This is important. It is very important if you had a tissue sample (biopsy) taken. Get help right away if:  You have shortness of breath that gets worse.  You get light-headed.  You feel like you are going to pass out (faint).  You have chest pain.  You cough up: ? More than a little blood. ? More blood than before. Summary  Do not eat or drink anything (not even water) for 2 hours after your test, or until your numbing medicine wears off.  Do not use cigarettes. Do not use e-cigarettes.  Get help right away if you have chest pain.  Please call our office for any questions or concerns.  [phone].  This information is not intended to replace advice given to you by your health care provider. Make sure you discuss any questions you have with your health care provider. Document Revised: 07/22/2017 Document Reviewed: 08/27/2016 Elsevier Patient Education  2020 [employer].

## 2020-06-07 ENCOUNTER — Encounter (HOSPITAL_COMMUNITY): Payer: Self-pay | Admitting: Emergency Medicine

## 2020-06-09 LAB — CYTOLOGY - NON PAP

## 2020-06-11 ENCOUNTER — Other Ambulatory Visit (HOSPITAL_COMMUNITY)

## 2020-06-11 ENCOUNTER — Telehealth: Payer: Self-pay | Admitting: Emergency Medicine

## 2020-06-11 NOTE — Telephone Encounter (Signed)
Call patient to review bronchoscopy results from last week. Her transbronchial biopsies and brushings were negative for malignancy, nondiagnostic. No answer, left her message that I would try her again.

## 2020-06-11 NOTE — Telephone Encounter (Signed)
Spoke with the patient, reviewed her biopsy results.  These were negative for malignancy.  That being said I am still suspicious that the small nodule is cancer-either primary lung or urothelial metastatic disease.  She is about to undertake chemotherapy with Dr. Alen Blew.  Is possible that the nodule shrink.  It is also possible that going forward there may be value in discussing a repeat bronchoscopy and biopsy.  I will be at the ready to help her with this should it become indicated. Will send FYI to Dr Alen Blew.

## 2020-06-13 ENCOUNTER — Ambulatory Visit: Admitting: Oncology

## 2020-06-13 ENCOUNTER — Inpatient Hospital Stay: Payer: Medicare HMO

## 2020-06-13 ENCOUNTER — Other Ambulatory Visit: Payer: Self-pay | Admitting: Oncology

## 2020-06-13 ENCOUNTER — Other Ambulatory Visit: Payer: Self-pay

## 2020-06-13 ENCOUNTER — Ambulatory Visit: Admitting: Medical

## 2020-06-13 ENCOUNTER — Ambulatory Visit

## 2020-06-13 ENCOUNTER — Encounter (HOSPITAL_BASED_OUTPATIENT_CLINIC_OR_DEPARTMENT_OTHER): Admitting: Medical

## 2020-06-13 ENCOUNTER — Other Ambulatory Visit

## 2020-06-13 VITALS — BP 133/66 | HR 72 | Temp 98.1°F | Resp 18

## 2020-06-13 DIAGNOSIS — C659 Malignant neoplasm of unspecified renal pelvis: Secondary | ICD-10-CM

## 2020-06-13 DIAGNOSIS — C651 Malignant neoplasm of right renal pelvis: Secondary | ICD-10-CM | POA: Diagnosis not present

## 2020-06-13 DIAGNOSIS — Z5111 Encounter for antineoplastic chemotherapy: Secondary | ICD-10-CM | POA: Diagnosis not present

## 2020-06-13 DIAGNOSIS — R911 Solitary pulmonary nodule: Secondary | ICD-10-CM | POA: Diagnosis not present

## 2020-06-13 DIAGNOSIS — Z95828 Presence of other vascular implants and grafts: Secondary | ICD-10-CM | POA: Insufficient documentation

## 2020-06-13 LAB — CMP (CANCER CENTER ONLY)
ALT: 18 U/L (ref 0–44)
AST: 13 U/L — ABNORMAL LOW (ref 15–41)
Albumin: 3.5 g/dL (ref 3.5–5.0)
Alkaline Phosphatase: 71 U/L (ref 38–126)
Anion gap: 8 (ref 5–15)
BUN: 27 mg/dL — ABNORMAL HIGH (ref 8–23)
CO2: 25 mmol/L (ref 22–32)
Calcium: 9 mg/dL (ref 8.9–10.3)
Chloride: 103 mmol/L (ref 98–111)
Creatinine: 1.46 mg/dL — ABNORMAL HIGH (ref 0.44–1.00)
GFR, Estimated: 38 mL/min — ABNORMAL LOW (ref >60)
Glucose, Bld: 104 mg/dL — ABNORMAL HIGH (ref 70–99)
Potassium: 3.6 mmol/L (ref 3.5–5.1)
Sodium: 136 mmol/L (ref 135–145)
Total Bilirubin: 0.6 mg/dL (ref 0.3–1.2)
Total Protein: 6.3 g/dL — ABNORMAL LOW (ref 6.5–8.1)

## 2020-06-13 LAB — CBC WITH DIFFERENTIAL (CANCER CENTER ONLY)
Abs Immature Granulocytes: 0.04 10*3/uL (ref 0.00–0.07)
Basophils Absolute: 0 10*3/uL (ref 0.0–0.1)
Basophils Relative: 0 %
Eosinophils Absolute: 0.3 10*3/uL (ref 0.0–0.5)
Eosinophils Relative: 3 %
HCT: 36.7 % (ref 36.0–46.0)
Hemoglobin: 12.4 g/dL (ref 12.0–15.0)
Immature Granulocytes: 0 %
Lymphocytes Relative: 25 %
Lymphs Abs: 2.3 10*3/uL (ref 0.7–4.0)
MCH: 31.5 pg (ref 26.0–34.0)
MCHC: 33.8 g/dL (ref 30.0–36.0)
MCV: 93.1 fL (ref 80.0–100.0)
Monocytes Absolute: 0.9 10*3/uL (ref 0.1–1.0)
Monocytes Relative: 10 %
Neutro Abs: 5.7 10*3/uL (ref 1.7–7.7)
Neutrophils Relative %: 62 %
Platelet Count: 243 10*3/uL (ref 150–400)
RBC: 3.94 MIL/uL (ref 3.87–5.11)
RDW: 13.3 % (ref 11.5–15.5)
WBC Count: 9.2 10*3/uL (ref 4.0–10.5)
nRBC: 0 % (ref 0.0–0.2)

## 2020-06-13 MED ORDER — PALONOSETRON HCL INJECTION 0.25 MG/5ML
0.2500 mg | Freq: Once | INTRAVENOUS | Status: AC
  Administered 2020-06-13: 0.25 mg via INTRAVENOUS

## 2020-06-13 MED ORDER — SODIUM CHLORIDE 0.9 % IV SOLN
Freq: Once | INTRAVENOUS | Status: AC
  Filled 2020-06-13: qty 10

## 2020-06-13 MED ORDER — SODIUM CHLORIDE 0.9 % IV SOLN
150.0000 mg | Freq: Once | INTRAVENOUS | Status: AC
  Administered 2020-06-13: 150 mg via INTRAVENOUS
  Filled 2020-06-13: qty 150

## 2020-06-13 MED ORDER — SODIUM CHLORIDE 0.9 % IV SOLN
Freq: Once | INTRAVENOUS | Status: AC
  Filled 2020-06-13: qty 250

## 2020-06-13 MED ORDER — HEPARIN SOD (PORK) LOCK FLUSH 100 UNIT/ML IV SOLN
500.0000 [IU] | Freq: Once | INTRAVENOUS | Status: AC | PRN
  Administered 2020-06-13: 500 [IU]
  Filled 2020-06-13: qty 5

## 2020-06-13 MED ORDER — SODIUM CHLORIDE 0.9% FLUSH
10.0000 mL | INTRAVENOUS | Status: DC | PRN
  Administered 2020-06-13: 10 mL
  Filled 2020-06-13: qty 10

## 2020-06-13 MED ORDER — SODIUM CHLORIDE 0.9 % IV SOLN
35.0000 mg/m2 | Freq: Once | INTRAVENOUS | Status: AC
  Administered 2020-06-13: 80 mg via INTRAVENOUS
  Filled 2020-06-13: qty 80

## 2020-06-13 MED ORDER — SODIUM CHLORIDE 0.9 % IV SOLN
1000.0000 mg/m2 | Freq: Once | INTRAVENOUS | Status: AC
  Administered 2020-06-13: 2280 mg via INTRAVENOUS
  Filled 2020-06-13: qty 59.96

## 2020-06-13 MED ORDER — SODIUM CHLORIDE 0.9 % IV SOLN
10.0000 mg | Freq: Once | INTRAVENOUS | Status: AC
  Administered 2020-06-13: 10 mg via INTRAVENOUS
  Filled 2020-06-13: qty 10

## 2020-06-13 MED ORDER — SODIUM CHLORIDE 0.9% FLUSH
10.0000 mL | Freq: Once | INTRAVENOUS | Status: AC
  Administered 2020-06-13: 10 mL
  Filled 2020-06-13: qty 10

## 2020-06-13 MED ORDER — PALONOSETRON HCL INJECTION 0.25 MG/5ML
INTRAVENOUS | Status: AC
  Filled 2020-06-13: qty 5

## 2020-06-13 NOTE — Patient Instructions (Signed)
[facility] Discharge Instructions for Patients Receiving Chemotherapy  Today you received the following chemotherapy agents cisplatin; gemzar  To help prevent nausea and vomiting after your treatment, we encourage you to take your nausea medication as directed   If you develop nausea and vomiting that is not controlled by your nausea medication, call the clinic.   BELOW ARE SYMPTOMS THAT SHOULD BE REPORTED IMMEDIATELY:  *FEVER GREATER THAN 100.5 F  *CHILLS WITH OR WITHOUT FEVER  NAUSEA AND VOMITING THAT IS NOT CONTROLLED WITH YOUR NAUSEA MEDICATION  *UNUSUAL SHORTNESS OF BREATH  *UNUSUAL BRUISING OR BLEEDING  TENDERNESS IN MOUTH AND THROAT WITH OR WITHOUT PRESENCE OF ULCERS  *URINARY PROBLEMS  *BOWEL PROBLEMS  UNUSUAL RASH Items with * indicate a potential emergency and should be followed up as soon as possible.  Feel free to call the clinic should you have any questions or concerns. The clinic phone number is [phone].  Please show the [facility] at check-in to the Emergency Department and triage nurse.  Cisplatin injection What is this medicine? CISPLATIN (SIS pla tin) is a chemotherapy drug. It targets fast dividing cells, like cancer cells, and causes these cells to die. This medicine is used to treat many types of cancer like bladder, ovarian, and testicular cancers. This medicine may be used for other purposes; ask your health care provider or pharmacist if you have questions. COMMON BRAND NAME(S): Platinol, Platinol -AQ What should I tell my health care provider before I take this medicine? They need to know if you have any of these conditions:  eye disease, vision problems  hearing problems  kidney disease  low blood counts, like white cells, platelets, or red blood cells  tingling of the fingers or toes, or other nerve disorder  an unusual or allergic reaction to cisplatin, carboplatin, oxaliplatin, other medicines, foods, dyes,  or preservatives  pregnant or trying to get pregnant  breast-feeding How should I use this medicine? This drug is given as an infusion into a vein. It is administered in a hospital or clinic by a specially trained health care professional. Talk to your pediatrician regarding the use of this medicine in children. Special care may be needed. Overdosage: If you think you have taken too much of this medicine contact a poison control center or emergency room at once. NOTE: This medicine is only for you. Do not share this medicine with others. What if I miss a dose? It is important not to miss a dose. Call your doctor or health care professional if you are unable to keep an appointment. What may interact with this medicine? This medicine may interact with the following medications:  foscarnet  certain antibiotics like amikacin, gentamicin, neomycin, polymyxin B, streptomycin, tobramycin, vancomycin This list may not describe all possible interactions. Give your health care provider a list of all the medicines, herbs, non-prescription drugs, or dietary supplements you use. Also tell them if you smoke, drink alcohol, or use illegal drugs. Some items may interact with your medicine. What should I watch for while using this medicine? Your condition will be monitored carefully while you are receiving this medicine. You will need important blood work done while you are taking this medicine. This drug may make you feel generally unwell. This is not uncommon, as chemotherapy can affect healthy cells as well as cancer cells. Report any side effects. Continue your course of treatment even though you feel ill unless your doctor tells you to stop. This medicine may increase  your risk of getting an infection. Call your healthcare professional for advice if you get a fever, chills, or sore throat, or other symptoms of a cold or flu. Do not treat yourself. Try to avoid being around people who are sick. Avoid  taking medicines that contain aspirin, acetaminophen, ibuprofen, naproxen, or ketoprofen unless instructed by your healthcare professional. These medicines may hide a fever. This medicine may increase your risk to bruise or bleed. Call your doctor or health care professional if you notice any unusual bleeding. Be careful brushing and flossing your teeth or using a toothpick because you may get an infection or bleed more easily. If you have any dental work done, tell your dentist you are receiving this medicine. Do not become pregnant while taking this medicine or for 14 months after stopping it. Women should inform their healthcare professional if they wish to become pregnant or think they might be pregnant. Men should not father a child while taking this medicine and for 11 months after stopping it. There is potential for serious side effects to an unborn child. Talk to your healthcare professional for more information. Do not breast-feed an infant while taking this medicine. This medicine has caused ovarian failure in some women. This medicine may make it more difficult to get pregnant. Talk to your healthcare professional if you are concerned about your fertility. This medicine has caused decreased sperm counts in some men. This may make it more difficult to father a child. Talk to your healthcare professional if you are concerned about your fertility. Drink fluids as directed while you are taking this medicine. This will help protect your kidneys. Call your doctor or health care professional if you get diarrhea. Do not treat yourself. What side effects may I notice from receiving this medicine? Side effects that you should report to your doctor or health care professional as soon as possible:  allergic reactions like skin rash, itching or hives, swelling of the face, lips, or tongue  blurred vision  changes in vision  decreased hearing or ringing of the ears  nausea, vomiting  pain,  redness, or irritation at site where injected  pain, tingling, numbness in the hands or feet  signs and symptoms of bleeding such as bloody or black, tarry stools; red or dark brown urine; spitting up blood or brown material that looks like coffee grounds; red spots on the skin; unusual bruising or bleeding from the eyes, gums, or nose  signs and symptoms of infection like fever; chills; cough; sore throat; pain or trouble passing urine  signs and symptoms of kidney injury like trouble passing urine or change in the amount of urine  signs and symptoms of low red blood cells or anemia such as unusually weak or tired; feeling faint or lightheaded; falls; breathing problems Side effects that usually do not require medical attention (report to your doctor or health care professional if they continue or are bothersome):  loss of appetite  mouth sores  muscle cramps This list may not describe all possible side effects. Call your doctor for medical advice about side effects. You may report side effects to FDA at 1-800-FDA-1088. Where should I keep my medicine? This drug is given in a hospital or clinic and will not be stored at home. NOTE: This sheet is a summary. It may not cover all possible information. If you have questions about this medicine, talk to your doctor, pharmacist, or health care provider.  2020 Elsevier/Gold Standard (2018-08-04 15:59:17)  Gemcitabine injection  What is this medicine? GEMCITABINE (jem SYE ta been) is a chemotherapy drug. This medicine is used to treat many types of cancer like breast cancer, lung cancer, pancreatic cancer, and ovarian cancer. This medicine may be used for other purposes; ask your health care provider or pharmacist if you have questions. COMMON BRAND NAME(S): Gemzar, Infugem What should I tell my health care provider before I take this medicine? They need to know if you have any of these conditions:  blood disorders  infection  kidney  disease  liver disease  lung or breathing disease, like asthma  recent or ongoing radiation therapy  an unusual or allergic reaction to gemcitabine, other chemotherapy, other medicines, foods, dyes, or preservatives  pregnant or trying to get pregnant  breast-feeding How should I use this medicine? This drug is given as an infusion into a vein. It is administered in a hospital or clinic by a specially trained health care professional. Talk to your pediatrician regarding the use of this medicine in children. Special care may be needed. Overdosage: If you think you have taken too much of this medicine contact a poison control center or emergency room at once. NOTE: This medicine is only for you. Do not share this medicine with others. What if I miss a dose? It is important not to miss your dose. Call your doctor or health care professional if you are unable to keep an appointment. What may interact with this medicine?  medicines to increase blood counts like filgrastim, pegfilgrastim, sargramostim  some other chemotherapy drugs like cisplatin  vaccines Talk to your doctor or health care professional before taking any of these medicines:  acetaminophen  aspirin  ibuprofen  ketoprofen  naproxen This list may not describe all possible interactions. Give your health care provider a list of all the medicines, herbs, non-prescription drugs, or dietary supplements you use. Also tell them if you smoke, drink alcohol, or use illegal drugs. Some items may interact with your medicine. What should I watch for while using this medicine? Visit your doctor for checks on your progress. This drug may make you feel generally unwell. This is not uncommon, as chemotherapy can affect healthy cells as well as cancer cells. Report any side effects. Continue your course of treatment even though you feel ill unless your doctor tells you to stop. In some cases, you may be given additional medicines to  help with side effects. Follow all directions for their use. Call your doctor or health care professional for advice if you get a fever, chills or sore throat, or other symptoms of a cold or flu. Do not treat yourself. This drug decreases your body's ability to fight infections. Try to avoid being around people who are sick. This medicine may increase your risk to bruise or bleed. Call your doctor or health care professional if you notice any unusual bleeding. Be careful brushing and flossing your teeth or using a toothpick because you may get an infection or bleed more easily. If you have any dental work done, tell your dentist you are receiving this medicine. Avoid taking products that contain aspirin, acetaminophen, ibuprofen, naproxen, or ketoprofen unless instructed by your doctor. These medicines may hide a fever. Do not become pregnant while taking this medicine or for 6 months after stopping it. Women should inform their doctor if they wish to become pregnant or think they might be pregnant. Men should not father a child while taking this medicine and for 3 months after stopping it. There  is a potential for serious side effects to an unborn child. Talk to your health care professional or pharmacist for more information. Do not breast-feed an infant while taking this medicine or for at least 1 week after stopping it. Men should inform their doctors if they wish to father a child. This medicine may lower sperm counts. Talk with your doctor or health care professional if you are concerned about your fertility. What side effects may I notice from receiving this medicine? Side effects that you should report to your doctor or health care professional as soon as possible:  allergic reactions like skin rash, itching or hives, swelling of the face, lips, or tongue  breathing problems  pain, redness, or irritation at site where injected  signs and symptoms of a dangerous change in heartbeat or heart  rhythm like chest pain; dizziness; fast or irregular heartbeat; palpitations; feeling faint or lightheaded, falls; breathing problems  signs of decreased platelets or bleeding - bruising, pinpoint red spots on the skin, black, tarry stools, blood in the urine  signs of decreased red blood cells - unusually weak or tired, feeling faint or lightheaded, falls  signs of infection - fever or chills, cough, sore throat, pain or difficulty passing urine  signs and symptoms of kidney injury like trouble passing urine or change in the amount of urine  signs and symptoms of liver injury like dark yellow or brown urine; general ill feeling or flu-like symptoms; light-colored stools; loss of appetite; nausea; right upper belly pain; unusually weak or tired; yellowing of the eyes or skin  swelling of ankles, feet, hands Side effects that usually do not require medical attention (report to your doctor or health care professional if they continue or are bothersome):  constipation  diarrhea  hair loss  loss of appetite  nausea  rash  vomiting This list may not describe all possible side effects. Call your doctor for medical advice about side effects. You may report side effects to FDA at 1-800-FDA-1088. Where should I keep my medicine? This drug is given in a hospital or clinic and will not be stored at home. NOTE: This sheet is a summary. It may not cover all possible information. If you have questions about this medicine, talk to your doctor, pharmacist, or health care provider.  2020 Elsevier/Gold Standard (2017-11-02 18:06:11)

## 2020-06-16 ENCOUNTER — Telehealth: Payer: Self-pay | Admitting: *Deleted

## 2020-06-16 NOTE — Telephone Encounter (Signed)
-----   Message from Priscille Loveless, RN sent at 06/13/2020  4:37 PM EDT ----- Regarding: [facility] Cisplatin/Gemzar

## 2020-06-16 NOTE — Telephone Encounter (Signed)
Called pt to see how she did with her new treatment last week.  She reports that she is doing OK but woke this am @ 3 am with nausea.  She took her nausea med & has eaten this am. She reports that nausea meds help.  She is eating & drinking.  She is up to bathroom a lot at hs & urine is clear.  She is concerned about a tooth that just came out.  It was a crowned tooth but tooth itself was cracked & dentist informed that it would need to come out.  She would like to have tooth replaced.  She will discuss with Dr Alen Blew at next visit to see what her options are.  She does also have some soreness in her mouth.  Discussed Baking Soda/Salt water rinses alternating with Biotene & to call if worse or no better. She knows to call with questions/concerns.

## 2020-06-17 ENCOUNTER — Telehealth: Payer: Self-pay

## 2020-06-17 NOTE — Telephone Encounter (Signed)
Ok. Thanks!

## 2020-06-17 NOTE — Telephone Encounter (Signed)
Called patient to reschedule a physical but Maria Marquez mentioned that she is wanting to do a TOC from Dr.Andy to [place] as she was recently diagnosed with kidney cancer and has been having a hard time getting in for an appointment with Dr.Andy.

## 2020-06-17 NOTE — Telephone Encounter (Signed)
Ok with me 

## 2020-06-17 NOTE — Progress Notes (Signed)
erro  neous encounter

## 2020-06-17 NOTE — Telephone Encounter (Signed)
Patient is scheduled for 08/11/20 with [facility].

## 2020-06-18 ENCOUNTER — Inpatient Hospital Stay: Admit: 2020-06-18 | Admitting: Urology

## 2020-06-18 SURGERY — NEPHROURETERECTOMY, ROBOT-ASSISTED, LAPAROSCOPIC
Anesthesia: General | Laterality: Right

## 2020-06-18 NOTE — Progress Notes (Signed)
The patient was seen in the infusion room today after receiving cisplatin and gemcitabine.  She was receiving post hydration IV fluids when she reported that she was having a headache and felt flushed.  The patient's face was mildly erythematous.  She had Tylenol with her and took 2 tablets of 500 mg each of Tylenol.  No other intervention was indicated.  The patient continued her post hydration IV fluids and then was released home without any additional issues of concern.  Sandi Mealy, MHS, PA-C Physician Assistant

## 2020-06-20 ENCOUNTER — Other Ambulatory Visit: Payer: Self-pay

## 2020-06-20 ENCOUNTER — Inpatient Hospital Stay: Payer: Medicare HMO

## 2020-06-20 VITALS — BP 148/73 | HR 61 | Temp 98.1°F | Resp 16 | Wt 251.0 lb

## 2020-06-20 DIAGNOSIS — Z5111 Encounter for antineoplastic chemotherapy: Secondary | ICD-10-CM | POA: Diagnosis not present

## 2020-06-20 DIAGNOSIS — R911 Solitary pulmonary nodule: Secondary | ICD-10-CM | POA: Diagnosis not present

## 2020-06-20 DIAGNOSIS — Z95828 Presence of other vascular implants and grafts: Secondary | ICD-10-CM

## 2020-06-20 DIAGNOSIS — C659 Malignant neoplasm of unspecified renal pelvis: Secondary | ICD-10-CM

## 2020-06-20 DIAGNOSIS — C651 Malignant neoplasm of right renal pelvis: Secondary | ICD-10-CM | POA: Diagnosis not present

## 2020-06-20 LAB — CMP (CANCER CENTER ONLY)
ALT: 19 U/L (ref 0–44)
AST: 12 U/L — ABNORMAL LOW (ref 15–41)
Albumin: 3.6 g/dL (ref 3.5–5.0)
Alkaline Phosphatase: 80 U/L (ref 38–126)
Anion gap: 10 (ref 5–15)
BUN: 29 mg/dL — ABNORMAL HIGH (ref 8–23)
CO2: 25 mmol/L (ref 22–32)
Calcium: 10.1 mg/dL (ref 8.9–10.3)
Chloride: 102 mmol/L (ref 98–111)
Creatinine: 1.44 mg/dL — ABNORMAL HIGH (ref 0.44–1.00)
GFR, Estimated: 39 mL/min — ABNORMAL LOW (ref >60)
Glucose, Bld: 97 mg/dL (ref 70–99)
Potassium: 4.2 mmol/L (ref 3.5–5.1)
Sodium: 137 mmol/L (ref 135–145)
Total Bilirubin: 0.6 mg/dL (ref 0.3–1.2)
Total Protein: 6.6 g/dL (ref 6.5–8.1)

## 2020-06-20 LAB — CBC WITH DIFFERENTIAL (CANCER CENTER ONLY)
Abs Immature Granulocytes: 0 10*3/uL (ref 0.00–0.07)
Basophils Absolute: 0 10*3/uL (ref 0.0–0.1)
Basophils Relative: 1 %
Eosinophils Absolute: 0.1 10*3/uL (ref 0.0–0.5)
Eosinophils Relative: 2 %
HCT: 36.6 % (ref 36.0–46.0)
Hemoglobin: 12.4 g/dL (ref 12.0–15.0)
Immature Granulocytes: 0 %
Lymphocytes Relative: 43 %
Lymphs Abs: 1.7 10*3/uL (ref 0.7–4.0)
MCH: 30.9 pg (ref 26.0–34.0)
MCHC: 33.9 g/dL (ref 30.0–36.0)
MCV: 91.3 fL (ref 80.0–100.0)
Monocytes Absolute: 0.2 10*3/uL (ref 0.1–1.0)
Monocytes Relative: 4 %
Neutro Abs: 1.9 10*3/uL (ref 1.7–7.7)
Neutrophils Relative %: 50 %
Platelet Count: 180 10*3/uL (ref 150–400)
RBC: 4.01 MIL/uL (ref 3.87–5.11)
RDW: 12.6 % (ref 11.5–15.5)
WBC Count: 3.8 10*3/uL — ABNORMAL LOW (ref 4.0–10.5)
nRBC: 0 % (ref 0.0–0.2)

## 2020-06-20 LAB — MAGNESIUM: Magnesium: 1.6 mg/dL — ABNORMAL LOW (ref 1.7–2.4)

## 2020-06-20 MED ORDER — HEPARIN SOD (PORK) LOCK FLUSH 100 UNIT/ML IV SOLN
500.0000 [IU] | Freq: Once | INTRAVENOUS | Status: AC | PRN
  Administered 2020-06-20: 500 [IU]
  Filled 2020-06-20: qty 5

## 2020-06-20 MED ORDER — SODIUM CHLORIDE 0.9% FLUSH
10.0000 mL | INTRAVENOUS | Status: DC | PRN
  Administered 2020-06-20: 10 mL
  Filled 2020-06-20: qty 10

## 2020-06-20 MED ORDER — SODIUM CHLORIDE 0.9 % IV SOLN
1000.0000 mg/m2 | Freq: Once | INTRAVENOUS | Status: AC
  Administered 2020-06-20: 2280 mg via INTRAVENOUS
  Filled 2020-06-20: qty 59.96

## 2020-06-20 MED ORDER — PROCHLORPERAZINE MALEATE 10 MG PO TABS
ORAL_TABLET | ORAL | Status: AC
  Filled 2020-06-20: qty 1

## 2020-06-20 MED ORDER — PROCHLORPERAZINE MALEATE 10 MG PO TABS
10.0000 mg | ORAL_TABLET | Freq: Once | ORAL | Status: AC
  Administered 2020-06-20: 10 mg via ORAL

## 2020-06-20 MED ORDER — SODIUM CHLORIDE 0.9 % IV SOLN
Freq: Once | INTRAVENOUS | Status: AC
  Filled 2020-06-20: qty 250

## 2020-06-20 MED ORDER — SODIUM CHLORIDE 0.9% FLUSH
10.0000 mL | Freq: Once | INTRAVENOUS | Status: AC
  Administered 2020-06-20: 10 mL
  Filled 2020-06-20: qty 10

## 2020-06-20 NOTE — Patient Instructions (Signed)
[facility] Discharge Instructions for Patients Receiving Chemotherapy  Today you received the following chemotherapy agents gemzar  To help prevent nausea and vomiting after your treatment, we encourage you to take your nausea medication as directed.  If you develop nausea and vomiting that is not controlled by your nausea medication, call the clinic.   BELOW ARE SYMPTOMS THAT SHOULD BE REPORTED IMMEDIATELY:  *FEVER GREATER THAN 100.5 F  *CHILLS WITH OR WITHOUT FEVER  NAUSEA AND VOMITING THAT IS NOT CONTROLLED WITH YOUR NAUSEA MEDICATION  *UNUSUAL SHORTNESS OF BREATH  *UNUSUAL BRUISING OR BLEEDING  TENDERNESS IN MOUTH AND THROAT WITH OR WITHOUT PRESENCE OF ULCERS  *URINARY PROBLEMS  *BOWEL PROBLEMS  UNUSUAL RASH Items with * indicate a potential emergency and should be followed up as soon as possible.  Feel free to call the clinic you have any questions or concerns. The clinic phone number is [phone].  

## 2020-06-21 ENCOUNTER — Other Ambulatory Visit: Payer: Self-pay | Admitting: Oncology

## 2020-06-21 MED ORDER — DEXAMETHASONE 4 MG PO TABS: 8.0000 mg | ORAL_TABLET | Freq: Two times a day (BID) | ORAL | 1 refills | Status: DC

## 2020-06-21 MED ORDER — PROCHLORPERAZINE MALEATE 10 MG PO TABS: ORAL_TABLET | ORAL | 0 refills | Status: DC

## 2020-06-21 MED ORDER — LORAZEPAM 0.5 MG PO TABS: 0.5000 mg | ORAL_TABLET | Freq: Every evening | ORAL | 0 refills | Status: DC | PRN

## 2020-06-21 NOTE — Progress Notes (Signed)
Maria Marquez called to report chills and severe nausea, no vomiting. The chills have resolved but the nausea persists. She is able to keep pills down. She does not have DM. She is trying to keep herself well hydrated.  Wrote for dexamethasone 8 mg po BID x 2 d and increased compazine to QID x 2 days. Added lorazepam PRN hs. She will  Call if she cannot keep herself well-hydrated next day

## 2020-06-23 ENCOUNTER — Telehealth: Payer: Self-pay | Admitting: *Deleted

## 2020-06-23 ENCOUNTER — Telehealth: Payer: Self-pay | Admitting: Family Medicine

## 2020-06-23 NOTE — Telephone Encounter (Signed)
Patient has been ok'd from Dr. Jerilee Hoh and Dr Billey Chang to transfer to Dr. Jerilee Hoh.  Patient needs a TOC visit.

## 2020-06-23 NOTE — Telephone Encounter (Signed)
Received Telephone Advice note from 06/21/2020 phone call.  Patient called because she received her 2nd round of chemo and was extremely nausea.  Caller said she took 1 tablet of zofran but still felt nausea but didn't know if she should take anything else.  See Dr. Jana Hakim ordered compazine.    Follow up call with patient, patient reports no nausea in past 2 days and is doing better.

## 2020-06-24 ENCOUNTER — Telehealth: Payer: Self-pay | Admitting: Oncology

## 2020-06-24 NOTE — Telephone Encounter (Signed)
Released records to [facility].Called today   06-24-20 to advise records are ready.    Release:  [id number]

## 2020-06-26 ENCOUNTER — Telehealth: Payer: Self-pay

## 2020-06-26 ENCOUNTER — Other Ambulatory Visit: Payer: Self-pay

## 2020-06-26 DIAGNOSIS — C659 Malignant neoplasm of unspecified renal pelvis: Secondary | ICD-10-CM

## 2020-06-26 NOTE — Telephone Encounter (Signed)
Scheduled patient to have labs drawn and  be seen in [facility] on 06/27/20. Patient is aware of appointments.

## 2020-06-26 NOTE — Telephone Encounter (Signed)
-----   Message from [name], MD sent at 06/26/2020  9:30 AM EDT ----- She can be seen at [facility] 11/5 if open.  ----- Message ----- From: Kennedy Bucker, LPN Sent: 52/03/4131   9:19 AM EDT To: [name], MD  Patient called in stating that she has swollen lymph node/ glands on the right side of her neck and it hurts to swallow. Patient questioned what should she do. [facility] is closed.  Kim LPN

## 2020-06-27 ENCOUNTER — Other Ambulatory Visit

## 2020-06-27 ENCOUNTER — Other Ambulatory Visit: Payer: Self-pay

## 2020-06-27 ENCOUNTER — Inpatient Hospital Stay: Payer: Medicare HMO

## 2020-06-27 ENCOUNTER — Inpatient Hospital Stay: Payer: Medicare HMO | Attending: Oncology | Admitting: Medical

## 2020-06-27 ENCOUNTER — Ambulatory Visit: Admitting: Oncology

## 2020-06-27 ENCOUNTER — Ambulatory Visit

## 2020-06-27 VITALS — BP 132/62 | HR 72 | Temp 98.2°F | Resp 18 | Ht 66.0 in | Wt 250.7 lb

## 2020-06-27 DIAGNOSIS — Z95828 Presence of other vascular implants and grafts: Secondary | ICD-10-CM | POA: Diagnosis not present

## 2020-06-27 DIAGNOSIS — C659 Malignant neoplasm of unspecified renal pelvis: Secondary | ICD-10-CM | POA: Diagnosis not present

## 2020-06-27 DIAGNOSIS — T451X5A Adverse effect of antineoplastic and immunosuppressive drugs, initial encounter: Secondary | ICD-10-CM | POA: Diagnosis not present

## 2020-06-27 DIAGNOSIS — Z5111 Encounter for antineoplastic chemotherapy: Secondary | ICD-10-CM | POA: Insufficient documentation

## 2020-06-27 DIAGNOSIS — R6889 Other general symptoms and signs: Secondary | ICD-10-CM

## 2020-06-27 DIAGNOSIS — C651 Malignant neoplasm of right renal pelvis: Secondary | ICD-10-CM | POA: Insufficient documentation

## 2020-06-27 DIAGNOSIS — D701 Agranulocytosis secondary to cancer chemotherapy: Secondary | ICD-10-CM

## 2020-06-27 DIAGNOSIS — Z23 Encounter for immunization: Secondary | ICD-10-CM | POA: Diagnosis not present

## 2020-06-27 LAB — CBC WITH DIFFERENTIAL (CANCER CENTER ONLY)
Abs Immature Granulocytes: 0 10*3/uL (ref 0.00–0.07)
Basophils Absolute: 0 10*3/uL (ref 0.0–0.1)
Basophils Relative: 0 %
Eosinophils Absolute: 0 10*3/uL (ref 0.0–0.5)
Eosinophils Relative: 1 %
HCT: 33.4 % — ABNORMAL LOW (ref 36.0–46.0)
Hemoglobin: 11.4 g/dL — ABNORMAL LOW (ref 12.0–15.0)
Immature Granulocytes: 0 %
Lymphocytes Relative: 64 %
Lymphs Abs: 1.8 10*3/uL (ref 0.7–4.0)
MCH: 31 pg (ref 26.0–34.0)
MCHC: 34.1 g/dL (ref 30.0–36.0)
MCV: 90.8 fL (ref 80.0–100.0)
Monocytes Absolute: 0.1 10*3/uL (ref 0.1–1.0)
Monocytes Relative: 4 %
Neutro Abs: 0.9 10*3/uL — ABNORMAL LOW (ref 1.7–7.7)
Neutrophils Relative %: 31 %
Platelet Count: 81 10*3/uL — ABNORMAL LOW (ref 150–400)
RBC: 3.68 MIL/uL — ABNORMAL LOW (ref 3.87–5.11)
RDW: 12.4 % (ref 11.5–15.5)
WBC Count: 2.8 10*3/uL — ABNORMAL LOW (ref 4.0–10.5)
nRBC: 0 % (ref 0.0–0.2)

## 2020-06-27 MED ORDER — HEPARIN SOD (PORK) LOCK FLUSH 100 UNIT/ML IV SOLN
500.0000 [IU] | Freq: Once | INTRAVENOUS | Status: AC
  Administered 2020-06-27: 500 [IU]
  Filled 2020-06-27: qty 5

## 2020-06-27 MED ORDER — SODIUM CHLORIDE 0.9% FLUSH
10.0000 mL | Freq: Once | INTRAVENOUS | Status: AC
  Administered 2020-06-27: 10 mL
  Filled 2020-06-27: qty 10

## 2020-06-27 MED ORDER — LEVOFLOXACIN IN D5W 500 MG/100ML IV SOLN
500.0000 mg | INTRAVENOUS | Status: DC
  Administered 2020-06-27: 500 mg via INTRAVENOUS
  Filled 2020-06-27: qty 100

## 2020-06-27 MED ORDER — LEVOFLOXACIN 250 MG PO TABS: 250.0000 mg | ORAL_TABLET | Freq: Every day | ORAL | 0 refills | Status: DC

## 2020-06-27 NOTE — Patient Instructions (Signed)
Levofloxacin injection What is this medicine? COMMON BRAND NAME(S): Levaquin What should I tell my health care provider before I take this medicine? They need to know if you have any of these conditions:  bone problems  diabetes  heart disease  high blood pressure  history of irregular heartbeat  history of low levels of potassium in the blood  joint problems  kidney disease  liver disease  mental illness  myasthenia gravis  seizures  tendon problems  tingling of the fingers or toes, or other nerve disorder  an unusual or allergic reaction to levofloxacin, other quinolone antibiotics, foods, dyes, or preservatives  pregnant or trying to get pregnant  breast-feeding How should I use this medicine? This medicine is for infusion into a vein. It is usually given by a health care professional in a hospital or clinic setting. If you get this medicine at home, you will be taught how to prepare and give this medicine. Use exactly as directed. Take your medicine at regular intervals. Do not take your medicine more often than directed. It is important that you put your used needles and syringes in a special sharps container. Do not put them in a trash can. If you do not have a sharps container, call your pharmacist or healthcare provider to get one. A special MedGuide will be given to you by the pharmacist with each prescription and refill. Be sure to read this information carefully each time. Talk to your pediatrician regarding the use of this medicine in children. While this drug may be prescribed for children as young as 6 months for selected conditions, precautions do apply. Overdosage: If you think you have taken too much of this medicine contact a poison control center or emergency room at once. NOTE: This medicine is only for you. Do not share this medicine with others. What if I miss a dose? If you miss a dose, use it as soon as you can. If it is almost time for your  next dose, use only that dose. Do not use double or extra doses. What may interact with this medicine? Do not take this medicine with any of the following medications:  cisapride  dronedarone  pimozide  thioridazine This medicine may also interact with the following medications:  birth control pills  certain medicines for diabetes, like glipizide, glyburide, or insulin  certain medicines that treat or prevent blood clots like warfarin  NSAIDS, medicines for pain and inflammation, like ibuprofen or naproxen  other medicines that prolong the QT interval (cause an abnormal heart rhythm) like dofetilide, ziprasidone  steroid medicines like prednisone or cortisone  theophylline This list may not describe all possible interactions. Give your health care provider a list of all the medicines, herbs, non-prescription drugs, or dietary supplements you use. Also tell them if you smoke, drink alcohol, or use illegal drugs. Some items may interact with your medicine. What should I watch for while using this medicine? What side effects may I notice from receiving this medicine? Side effects that you should report to your doctor or health care professional as soon as possible:  allergic reactions like skin rash or hives, swelling of the face, lips, or tongue  anxious  bloody or watery diarrhea  breathing problems  confusion  depressed mood  fast, irregular heartbeat  fever  hallucination, loss of contact with reality  joint, muscle, or tendon pain or swelling  loss of memory  muscle weakness  pain, tingling, numbness in the hands or feet  seizures  signs and symptoms of aortic dissection such as sudden chest, stomach, or back pain  signs and symptoms of high blood sugar such as being more thirsty or hungry or having to urinate more than normal. You may also feel very tired or have blurry vision.  signs and symptoms of liver injury like dark yellow or brown urine;  general ill feeling or flu-like symptoms; light-colored stools; loss of appetite; nausea; right upper belly pain; unusually weak or tired; yellowing of the eyes or skin  signs and symptoms of low blood sugar such as feeling anxious; confusion; dizziness; increased hunger; unusually weak or tired; sweating; shakiness; cold; irritable; headache; blurred vision; fast heartbeat; loss of consciousness; pale skin  suicidal thoughts or other mood changes  sunburn  unusually weak Side effects that usually do not require medical attention (report to your doctor or health care professional if they continue or are bothersome):  constipation  dry mouth  headache  nausea, vomiting  pain, irritation at the site of injection  trouble sleeping This list may not describe all possible side effects. Call your doctor for medical advice about side effects. You may report side effects to FDA at 1-800-FDA-1088. Where should I keep my medicine? Keep out of the reach of children. If you are using this medicine at home, you will be instructed on how to store this medicine. Throw away any unused medicine after the expiration date on the label.  2020 [employer]vier/Gold Standard (2018-07-31 14:00:45) Neutropenic Fever Neutropenic fever is a type of fever that can develop when you have a very low number of a certain kind of white blood cells called neutrophils. This condition is called neutropenia. Neutrophils are made in the spongy tissue inside the bones (bone marrow), and they help the body fight infection. When you have neutropenia, you could be in danger of a severe infection and neutropenic fever. Neutropenic fever must be treated right away with antibiotic medicines. What are the causes? This condition is caused by damage to the bone marrow or damage to neutrophils after they leave the bone marrow. Causes of neutropenia may include:  Cancer treatments.  Bone marrow cancer.  Cancer of the white blood cells  (leukemia or myeloma).  Severe infection.  Bone marrow failure (aplastic anemia).  Many types of medicines.  Conditions that affect the body's disease-fighting system (autoimmune diseases).  Genes that are passed from parent to child (inherited).  Lack (deficiency) of vitamin B.  Enlarged spleen in rheumatoid arthritis (Felty syndrome). What are the signs or symptoms? The main symptom of this condition is fever. Other symptoms include:  Chills.  Fatigue.  Painful mouth ulcers.  Cough.  Shortness of breath.  Swollen glands (lymph nodes).  Sore throat.  Sinus and ear infections.  Gum disease.  Skin infection.  Burning and frequent urination.  Rectal infections.  Vaginal discharge or itching.  Body aches. How is this diagnosed? You may be diagnosed with neutropenic fever if you have a neutrophil count of less than 500 neutrophils per microliter of blood and a fever of at least 100.52F (38.0C). You may have tests, such as:  Blood tests. These may include: ? A complete blood count (CBC) and a differential white blood count (WBC). ? Peripheral smear. This test involves checking a blood sample under a microscope.  Tests to see whether germs grow from samples of your blood, urine, or other body fluids (cultures). These may be done to check for a source of infection.  Chest X-rays. Your health care provider  will also determine whether your neutropenic fever is high risk or low risk.  You may have high-risk neutropenic fever if: ? Your neutrophil count is less than 100 neutrophils per microliter of blood. ? You have also been diagnosed with pneumonia or another serious medical problem or infection. ? Your condition requires you to be treated in the hospital. ? You are older than 60 years.  You may have low-risk neutropenic fever if: ? Your neutrophil count is more than 100 neutrophils per microliter of blood. ? Your chest X-ray is normal. ? You do not have an  active illness or any other problems that require you to be in the hospital. How is this treated? Treatment for this condition may be started as soon as you get diagnosed with neutropenic fever, even if your health care provider is still looking for the source of infection.  You may have to stop taking any medicine that could be causing neutropenic fever.  If you have high-risk neutropenic fever, you will receive one or more antibiotics through an IV in the hospital.  If you have low-risk neutropenic fever, you may be treated at home. Treatment may involve: ? Taking one or two antibiotics by mouth (orally). ? Receiving IV antibiotics that are given by a health care provider who visits your home.  If your health care provider finds a specific cause of infection, you may be switched to antibiotics that work best against those bacteria. If a fungal infection is found, your medicine will be changed to an antifungal medicine.  If your fever goes away in 3-5 days, you may have to take medicine for about 7 days. If your fever does not go away after 3-5 days, you may have to take medicine for a longer period.  If your fever is caused by cancer medicines (chemotherapy), you may need to take a certain medicine (white blood cell growth factors) that helps prevent fever. Follow these instructions at home: Preventing infection   Take steps to prevent infections: ? Avoid contact with sick people. ? Do not eat uncooked or undercooked meats. ? Wash all fruits and vegetables before eating. ? Do not eat or drink unpasteurized dairy products. ? Get regular dental care, and maintain good dental hygiene. ? Get a flu shot (influenza vaccine). Ask your health care provider whether you need any other vaccines. ? Wear gloves when gardening. ? Wash your hands often with soap and water. If soap and water are not available, use hand sanitizer. General instructions  Take over-the-counter and prescription  medicines only as told by your health care provider.  Take your antibiotic medicine as told by your health care provider. Do not stop taking the antibiotic even if you start to feel better.  Drink enough fluid to keep your urine pale yellow. This helps to prevent dehydration.  Keep all follow-up visits as told by your health care provider. This is important. If you are being treated with oral antibiotics at home, you may need to return to your health care provider every day to have your CBC checked. You may have to do this until your fever gets better. Contact a health care provider if:  You have chills.  You have a fever.  You have signs or symptoms of an infection. Get help right away if:  You have trouble breathing.  You have chest pain.  You feel faint or dizzy.  You faint. Summary  Neutropenic fever is a type of fever that can develop when you have  a very low number of white blood cells called neutrophils.  Neutropenic fever must be treated right away with antibiotic medicines.  Causes of this condition include cancers of the blood and bone marrow, as well as medicines to treat cancer (chemotherapy).  Take steps to reduce your risk of infection. These may include avoiding contact with sick people, cooking all meats thoroughly, washing fruits and vegetables before eating, and washing hands often with soap and water. This information is not intended to replace advice given to you by your health care provider. Make sure you discuss any questions you have with your health care provider. Document Revised: 07/22/2017 Document Reviewed: 11/16/2016 [employer] Patient Education  [employer].

## 2020-07-01 NOTE — Progress Notes (Signed)
[facility] Progress Note   Maria Marquez [id number] [birth year] 74 y.o.  Maria Marquez is managed by Dr. Zola Button  Actively treated with chemotherapy/immunotherapy/hormonal therapy: yes  Current therapy: Cisplatin and gemcitabine  Last treated: 06/20/2020 (cycle 1, day 8)  Next scheduled appointment with provider: 07/03/2020  Assessment: Plan:    Rigors - Plan: Culture, Blood, Culture, Blood, levofloxacin (LEVAQUIN) IVPB 500 mg, levofloxacin (LEVAQUIN) 250 MG tablet  Chemotherapy-induced neutropenia (HCC) - Plan: levofloxacin (LEVAQUIN) IVPB 500 mg, levofloxacin (LEVAQUIN) 250 MG tablet  Port-A-Cath in place - Plan: heparin lock flush 100 unit/mL, sodium chloride flush (NS) 0.9 % injection 10 mL  Cancer of renal pelvis, unspecified laterality ([place])   Rigors in the setting of neutropenia: A CBC today returned showing a WBC of 2.8 with an ANC of 0.9.  Blood cultures x2 were collected today.  The patient was given a Levaquin 500 mg IV x1 and was given a prescription for Levaquin 250 mg once daily for 6 days.  She will begin this tomorrow.  Cancer of the renal pelvis: Maria Marquez continues to be followed by Dr. Alen Blew and is status post cycle 1, day 8 of cis-platinum and gemcitabine which was dosed on 06/20/2020.  She is scheduled to be seen next on 07/03/2020.  Please see After Visit Summary for patient specific instructions.  Future Appointments  Date Time Provider [place]  07/03/2020 12:45 PM [facility] None  07/03/2020  1:00 PM [facility] [place] FLUSH [facility] None  07/03/2020  1:30 PM [name], MD [facility] None  07/04/2020  7:30 AM [facility] INFUSION [facility] None  07/04/2020  9:00 AM Karie Mainland, RD [facility] None  07/11/2020  1:45 PM [facility] None  07/11/2020  2:00 PM [facility] [place] FLUSH [facility] None  07/11/2020  3:00 PM [facility] INFUSION [facility] None  07/25/2020  7:45 AM  [facility] None  07/25/2020  8:00 AM [facility] [place] None  07/25/2020  8:45 AM Shadad, Mathis Dad, MD [facility] None  07/25/2020  9:30 AM [facility] INFUSION [facility] None  08/01/2020 12:30 PM [facility] None  08/01/2020 12:45 PM [facility] [place] FLUSH [facility] None  08/01/2020  1:45 PM [facility] INFUSION [facility] None  09/02/2020  3:00 PM [name], Rayford Halsted, MD [id number]    Orders Placed This Encounter  Procedures  . Culture, Blood  . Culture, Blood       Subjective:   [name] is a 74 y.o. (DOB 09/26/1945) female.  Chief Complaint: No chief complaint on file.   HPI Maria Marquez  is a 74 y.o. female with a diagnosis of a cancer of the renal pelvis.  She is managed by Dr. Alen Blew and is status post cycle 1, day 8 of cis-platinum and gemcitabine which was dosed on 06/20/2020.  She presents to the office today reporting that she had chills last evening.  She did not take her temperature.  She reports having a swollen lymph node in her right neck.  She continues to have nausea which is improved with her use of Zofran and Compazine.  She reports having constipation, mild bleeding from her nose, cracking along the corners of her mouth and a mild increase in her blood pressure.  She has has a cystoscopy scheduled for next week which she would like to delay.  She reports having some burning in her eyes and has an upcoming appointment with Dr. Bing Plume.  Medications: I have reviewed the patient's current  medications.  Allergies:  Allergies  Allergen Reactions  . Iodine Shortness Of Breath  . Keflex [Cephalexin] Shortness Of Breath, Rash and Tinitus  . Lasix [Furosemide] Itching  . Rosuvastatin Other (See Comments)    Muscle Pain in Thighs  . Shellfish Allergy Hives  . Sulfa Antibiotics Nausea Only  . Latex Rash  . Lisinopril Cough  . Tramadol Itching    Past Medical History:  Diagnosis Date  . Acute  blood loss anemia   . Arthritis   . Back pain   . Bilious vomiting with nausea   . 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 Chronic kidney disease, stage 3a ([place]) 12/31/2019  . Complication of anesthesia   . Constipation   . Dyspnea    On exertion and rest at times  . Gastroesophageal reflux disease without esophagitis 06/17/2015  . GERD (gastroesophageal reflux disease)   . HLD (hyperlipidemia)   . Hypertension   . Hypothyroid   . Joint pain   . Leg edema   . Leukocytosis   . Multiple food allergies   . Myalgia due to statin 11/23/2018  . Osteoarthritis of hip    Right  . Pneumonia   . PONV (postoperative nausea and vomiting)   . Postmenopausal hormone replacement therapy 06/17/2015  . Pre-diabetes   . Prediabetes   . Sessile colonic polyp 10/04/2017   Colonoscopy 12/2015, Dr. Fuller Plan, repeat q 5 years.    Past Surgical History:  Procedure Laterality Date  . CYSTOSCOPY WITH URETEROSCOPY Right 04/18/2020   Procedure: CYSTOSCOPY WITH URETEROSCOPY, BIOPSY;  Surgeon: Ceasar Mons, MD;  Location: [facility];  Service: Urology;  Laterality: Right;  ONLY NEEDS 45 MIN  . EYE SURGERY  1987  . HERNIA REPAIR    . IR IMAGING GUIDED PORT INSERTION  06/04/2020  . THYROID LOBECTOMY     right side  . TOTAL HIP ARTHROPLASTY Right 01/28/2016   Procedure: RIGHT TOTAL HIP ARTHROPLASTY ANTERIOR APPROACH;  Surgeon: [name], MD;  Location: [facility];  Service: Orthopedics;  Laterality: Right;  [name] VIDEO BRONCHOSCOPY WITH ENDOBRONCHIAL NAVIGATION N/A 06/06/2020   Procedure: VIDEO BRONCHOSCOPY WITH ENDOBRONCHIAL NAVIGATION;  Surgeon: Collene Gobble, MD;  Location: [facility];  Service: Thoracic;  Laterality: N/A;    Family History  Problem Relation Age of Onset  . Arthritis Mother   . Breast cancer Mother   . Schizophrenia Mother   . Diabetes Father   . Heart disease Father   . Kidney disease Father   . Stroke Sister   . Lung cancer Brother   . Heart disease Brother   . Lung cancer Brother    . Heart disease Brother   . Vision loss Brother        Glaucoma  . Alcohol abuse Brother     Social History   Socioeconomic History  . Marital status: Widowed    Spouse name: Not on file  . Number of children: Not on file  . Years of education: Not on file  . Highest education level: Not on file  Occupational History  . Occupation: Retired  Tobacco Use  . Smoking status: Former Smoker    Packs/day: 1.00    Years: 45.00    Pack years: 45.00    Types: Cigarettes    Quit date: 04/18/2020    Years since quitting: 0.2  . Smokeless tobacco: Former Systems developer  . Tobacco comment: Pt quit smoking 3 weeks ago.   Vaping Use  . Vaping Use:  Never used  Substance and Sexual Activity  . Alcohol use: Yes    Comment: rarely  . Drug use: No  . Sexual activity: Never  Other Topics Concern  . Not on file  Social History Narrative  . Not on file   Social Determinants of Health   Financial Resource Strain:   . Difficulty of Paying Living Expenses: Not on file  Food Insecurity:   . Worried About Charity fundraiser in the Last Year: Not on file  . Ran Out of Food in the Last Year: Not on file  Transportation Needs:   . Lack of Transportation (Medical): Not on file  . Lack of Transportation (Non-Medical): Not on file  Physical Activity:   . Days of Exercise per Week: Not on file  . Minutes of Exercise per Session: Not on file  Stress:   . Feeling of Stress : Not on file  Social Connections:   . Frequency of Communication with Friends and Family: Not on file  . Frequency of Social Gatherings with Friends and Family: Not on file  . Attends Religious Services: Not on file  . Active Member of Clubs or Organizations: Not on file  . Attends Archivist Meetings: Not on file  . Marital Status: Not on file  Intimate Partner Violence:   . Fear of Current or Ex-Partner: Not on file  . Emotionally Abused: Not on file  . Physically Abused: Not on file  . Sexually Abused: Not on file     Past Medical History, Surgical history, Social history, and Family history were reviewed and updated as appropriate.   Please see review of systems for further details on the patient's review from today.   Review of Systems:  Review of Systems  Constitutional: Positive for chills. Negative for appetite change, diaphoresis, fever and unexpected weight change.       Rigors last evening  HENT: Positive for nosebleeds. Negative for mouth sores, sore throat and trouble swallowing.        Cracking of the corners of her mouth.  Eyes:       Burning of the eyes  Respiratory: Negative for cough, chest tightness and shortness of breath.   Cardiovascular: Negative for chest pain and palpitations.  Gastrointestinal: Positive for constipation and nausea. Negative for abdominal distention, abdominal pain, anal bleeding, blood in stool, diarrhea, rectal pain and vomiting.  Neurological: Negative for dizziness, light-headedness and headaches.    Objective:   Physical Exam:  BP 132/62   Pulse 72   Temp 98.2 F (36.8 C) (Oral)   Resp 18   Ht 5\' 6"  (1.676 m)   Wt 250 lb 11.2 oz (113.7 kg)   SpO2 100%   BMI 40.46 kg/m  ECOG: 0  Physical Exam Constitutional:      General: She is not in acute distress.    Appearance: She is not diaphoretic.  HENT:     Head: Normocephalic and atraumatic.     Nose:     Comments: Nasal mucosa is erythematous    Mouth/Throat:     Mouth: Mucous membranes are moist.     Pharynx: Oropharynx is clear. Posterior oropharyngeal erythema present. No oropharyngeal exudate.     Comments: Mild erythema of the posterior pharynx Eyes:     General: No scleral icterus.       Right eye: No discharge.        Left eye: No discharge.     Conjunctiva/sclera: Conjunctivae normal.  Cardiovascular:  Rate and Rhythm: Normal rate and regular rhythm.     Heart sounds: Normal heart sounds. No murmur heard.  No friction rub. No gallop.   Pulmonary:     Effort: Pulmonary  effort is normal. No respiratory distress.     Breath sounds: Normal breath sounds. No wheezing or rales.  Abdominal:     General: Bowel sounds are normal. There is no distension.     Tenderness: There is no abdominal tenderness. There is no guarding.  Musculoskeletal:     Right lower leg: No edema.     Left lower leg: No edema.  Lymphadenopathy:     Cervical: Cervical adenopathy present.     Right cervical: Superficial cervical adenopathy present. No deep or posterior cervical adenopathy.    Left cervical: No superficial, deep or posterior cervical adenopathy.  Skin:    General: Skin is warm and dry.     Findings: No erythema or rash.  Neurological:     Mental Status: She is alert.     Coordination: Coordination normal.     Gait: Gait normal.  Psychiatric:        Mood and Affect: Mood normal.        Behavior: Behavior normal.        Thought Content: Thought content normal.        Judgment: Judgment normal.     Lab Review:     Component Value Date/Time   NA 137 06/20/2020 0920   NA 140 12/19/2019 1132   K 4.2 06/20/2020 0920   CL 102 06/20/2020 0920   CO2 25 06/20/2020 0920   GLUCOSE 97 06/20/2020 0920   BUN 29 (H) 06/20/2020 0920   BUN 25 12/19/2019 1132   CREATININE 1.44 (H) 06/20/2020 0920   CALCIUM 10.1 06/20/2020 0920   PROT 6.6 06/20/2020 0920   PROT 6.1 12/19/2019 1132   ALBUMIN 3.6 06/20/2020 0920   ALBUMIN 4.1 12/19/2019 1132   AST 12 (L) 06/20/2020 0920   ALT 19 06/20/2020 0920   ALKPHOS 80 06/20/2020 0920   BILITOT 0.6 06/20/2020 0920   GFRNONAA 39 (L) 06/20/2020 0920   GFRAA 51 (L) 04/15/2020 1501       Component Value Date/Time   WBC 2.8 (L) 06/27/2020 0955   WBC 8.7 06/04/2020 1209   RBC 3.68 (L) 06/27/2020 0955   HGB 11.4 (L) 06/27/2020 0955   HGB 14.5 11/01/2018 1306   HCT 33.4 (L) 06/27/2020 0955   HCT 43.0 11/01/2018 1306   PLT 81 (L) 06/27/2020 0955   PLT 263 02/22/2018 0936   MCV 90.8 06/27/2020 0955   MCV 90 11/01/2018 1306   MCH  31.0 06/27/2020 0955   MCHC 34.1 06/27/2020 0955   RDW 12.4 06/27/2020 0955   RDW 13.6 11/01/2018 1306   LYMPHSABS 1.8 06/27/2020 0955   LYMPHSABS 1.7 11/01/2018 1306   MONOABS 0.1 06/27/2020 0955   EOSABS 0.0 06/27/2020 0955   EOSABS 0.2 11/01/2018 1306   BASOSABS 0.0 06/27/2020 0955   BASOSABS 0.1 11/01/2018 1306   -------------------------------  Imaging from last 24 hours (if applicable):  Radiology interpretation: NM PET Image Initial (PI) Skull Base To Thigh  Result Date: 06/02/2020 CLINICAL DATA:  Initial treatment strategy for urothelial neoplasm. History of RIGHT-sided urothelial lesion discovered on hematuria assessment. EXAM: NUCLEAR MEDICINE PET SKULL BASE TO THIGH TECHNIQUE: 12.2 mCi F-18 FDG was injected intravenously. Full-ring PET imaging was performed from the skull base to thigh after the radiotracer. CT data was obtained and used for  attenuation correction and anatomic localization. Fasting blood glucose: 110 mg/dl COMPARISON:  March 19, 2020 and CT of the chest of 05/14/2020 FINDINGS: Mediastinal blood pool activity: SUV max 3.31 Liver activity: SUV max NA NECK: Asymmetric fullness at the base of LEFT tongue in the glossal a palatine fossa some artifact in this area, also with asymmetric apparent FDG uptake (image 27 of series 3 and series 4 with maximum SUV of 6.0. No hypermetabolic lymph nodes in the neck Incidental CT findings: none CHEST: RIGHT suprahilar mass with spiculated margins (image 61, series 4) this measures approximately 1.6 x 1.3 cm though with limited assessment in terms of size due to motion on the current study when measured in a similar fashion on the prior exam this measured approximately 1.3 x 1.3 cm (SUVmax = 11.1) RIGHT upper lobe nodule just along the major fissure measuring 5 mm is unchanged and not associated with increased FDG uptake Signs of RIGHT thyroidectomy Scattered small lymph nodes throughout the chest none with activity significantly above  that of mediastinal blood pool largest approximately 8 mm along the RIGHT paratracheal chain with a maximum SUV of 3.9, nonspecific. Incidental CT findings: Calcified atheromatous plaque of the thoracic aorta. Normal heart size. No pericardial effusion. No effusion. No consolidation. Pulmonary lesions as described. Airways are patent. ABDOMEN/PELVIS: No abnormal hypermetabolic activity within the liver, pancreas, adrenal glands, or spleen. No hypermetabolic lymph nodes in the abdomen or pelvis. Known urothelial lesion is likely diminished in size and ureter not well assessed as outlined below on FDG given excretion of FDG in the urinary tract Incidental CT findings: Low-density lesion in the LEFT hemi liver similar to the prior study likely a small cyst. Cholelithiasis. No peripancreatic stranding. Normal size spleen. Adrenal glands are unremarkable. No hydronephrosis. Ureteral dilation is diminished compared to the prior study. Ureteral assessment on FDG portion of the exam limited due to excretion of FDG into the ureters. Small hiatal hernia. No acute gastrointestinal process. The appendix is normal. Atheromatous plaque in the abdominal aorta tracking into the iliac vasculature. There is no gastrohepatic or hepatoduodenal ligament lymphadenopathy. No retroperitoneal or mesenteric lymphadenopathy. Moderate size fat containing umbilical hernia similar to the prior exam. No ascites. SKELETON: No focal hypermetabolic activity to suggest skeletal metastasis. Incidental CT findings: RIGHT hip arthroplasty. Spinal degenerative changes. IMPRESSION: 1. RIGHT upper lobe lesion with spiculated margins suspicious for primary pulmonary neoplasm, this is favored based on location and morphology over a metastatic process. Bronchoscopic assessment is suggested for further evaluation. 2. Mildly increased metabolic activity associated with RIGHT paratracheal lymph nodes though not significantly above mediastinal blood pool. This  could be assessed further on bronchoscopic evaluation. 3. Asymmetry of the base of the tongue on the LEFT as compared to the RIGHT with mild increased asymmetric FDG uptake. Consider direct visualization for further assessment. No adenopathy in the neck. 4. Postoperative changes of RIGHT thyroidectomy Electronically Signed   By: Zetta Bills M.D.   On: 06/02/2020 13:00   DG Chest Port 1 View  Result Date: 06/06/2020 CLINICAL DATA:  Status post bronchoscopy. EXAM: PORTABLE CHEST 1 VIEW COMPARISON:  April 14, 2019. FINDINGS: The heart size and mediastinal contours are within normal limits. No pneumothorax or pleural effusion is noted. Right internal jugular Port-A-Cath is noted in good position. Lungs are clear. The visualized skeletal structures are unremarkable. IMPRESSION: No acute cardiopulmonary abnormality seen. No pneumothorax is noted. Electronically Signed   By: Marijo Conception M.D.   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 Port-A-Cath needed for treatment. EXAM: FLUOROSCOPIC AND ULTRASOUND GUIDED PLACEMENT OF A SUBCUTANEOUS PORT COMPARISON:  None. MEDICATIONS: Clindamycin 900 mg; The antibiotic was administered within an appropriate time interval prior to skin puncture. ANESTHESIA/SEDATION: Versed 4.0 mg IV; Fentanyl 100 mcg IV; Moderate Sedation Time:  35 minutes The patient was continuously monitored during the procedure by the interventional radiology nurse under my direct supervision. FLUOROSCOPY TIME:  18 seconds, 4 mGy COMPLICATIONS: None immediate. PROCEDURE: The procedure, risks, benefits, and alternatives were explained to the patient. Questions regarding the procedure were encouraged and answered. The patient understands and consents to the procedure. Patient was placed supine on the interventional table. Ultrasound confirmed a patent right internal jugular vein. Ultrasound image was  saved for documentation. The right chest and neck were cleaned with a skin antiseptic and a sterile drape was placed. Maximal barrier sterile technique was utilized including caps, mask, sterile gowns, sterile gloves, sterile drape, hand hygiene and skin antiseptic. The right neck was anesthetized with 1% lidocaine. Small incision was made in the right neck with a blade. Micropuncture set was placed in the right internal jugular vein with ultrasound guidance. The micropuncture wire was used for measurement purposes. The right chest was anesthetized with 1% lidocaine with epinephrine. #15 blade was used to make an incision and a subcutaneous port pocket was formed. Stanley was assembled. Subcutaneous tunnel was formed with a stiff tunneling device. The port catheter was brought through the subcutaneous tunnel. The port was placed in the subcutaneous pocket and sutured in place. The micropuncture set was exchanged for a peel-away sheath. The catheter was placed through the peel-away sheath and the tip was positioned at the superior cavoatrial junction. Catheter placement was confirmed with fluoroscopy. The port was accessed and flushed with heparinized saline. The port pocket was closed using two layers of absorbable sutures and Dermabond. The vein skin site was closed using a single layer of absorbable suture and Dermabond. Sterile dressings were applied. Patient tolerated the procedure well without an immediate complication. Ultrasound and fluoroscopic images were taken and saved for this procedure. IMPRESSION: Placement of a subcutaneous port device. Catheter tip at the superior cavoatrial junction. Electronically Signed   By: Markus Daft M.D.   On: 06/04/2020 15:12   CT Super D Chest Wo Contrast  Result Date: 06/04/2020 CLINICAL DATA:  Evaluate lung mass. EXAM: CT CHEST WITHOUT CONTRAST TECHNIQUE: Multidetector CT imaging of the chest was performed using thin slice collimation for electromagnetic  bronchoscopy planning purposes, without intravenous contrast. COMPARISON:  06/02/2020. FINDINGS: Cardiovascular: The heart size appears within normal limits. No pericardial effusion. Aortic atherosclerosis. Coronary artery calcifications. There is a right chest wall port a catheter with tip in the distal SVC. Mediastinum/Nodes: Previous right thyroidectomy. The trachea appears patent and is midline. Normal appearance of the esophagus. No enlarged axillary, supraclavicular, mediastinal lymph nodes. Hilar lymph nodes are suboptimally evaluated due to lack of IV contrast. Lungs/Pleura: No pleural effusion identified. Right upper lobe part solid nodule is again noted. On today's study this measures 2.9 x 2.2 cm with a internal solid component measuring 1.6 cm, image 35/7. Posterior right upper lobe lung nodule is again seen measuring 4 mm, image 5/7. Upper Abdomen: Gallstones identified. Simple cyst is identified within the lateral segment of left hepatic lobe measuring 1.5 cm. Small hiatal hernia identified. Musculoskeletal: Spondylosis identified. IMPRESSION: 1. No significant change in size of part solid nodule  within the right upper lobe. 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 2. Aortic atherosclerosis and coronary artery calcifications. 3. Gallstones. 4. Small hiatal hernia. Aortic Atherosclerosis (ICD10-I70.0). Electronically Signed   By: Kerby Moors M.D.   On: 06/04/2020 16:23   DG C-ARM BRONCHOSCOPY  Result Date: 06/06/2020 C-ARM BRONCHOSCOPY: Fluoroscopy was utilized by the requesting physician.  No radiographic interpretation.

## 2020-07-02 DIAGNOSIS — H16223 Keratoconjunctivitis sicca, not specified as Sjogren's, bilateral: Secondary | ICD-10-CM | POA: Diagnosis not present

## 2020-07-02 DIAGNOSIS — H10503 Unspecified blepharoconjunctivitis, bilateral: Secondary | ICD-10-CM | POA: Diagnosis not present

## 2020-07-02 DIAGNOSIS — H2513 Age-related nuclear cataract, bilateral: Secondary | ICD-10-CM | POA: Diagnosis not present

## 2020-07-02 DIAGNOSIS — H35033 Hypertensive retinopathy, bilateral: Secondary | ICD-10-CM | POA: Diagnosis not present

## 2020-07-02 LAB — CULTURE, BLOOD (SINGLE)
Culture: NO GROWTH
Culture: NO GROWTH

## 2020-07-03 ENCOUNTER — Other Ambulatory Visit

## 2020-07-03 ENCOUNTER — Inpatient Hospital Stay (HOSPITAL_BASED_OUTPATIENT_CLINIC_OR_DEPARTMENT_OTHER): Payer: Medicare HMO | Admitting: Oncology

## 2020-07-03 ENCOUNTER — Inpatient Hospital Stay: Payer: Medicare HMO

## 2020-07-03 ENCOUNTER — Other Ambulatory Visit: Payer: Self-pay

## 2020-07-03 ENCOUNTER — Ambulatory Visit

## 2020-07-03 VITALS — BP 147/50 | HR 80 | Temp 97.2°F | Resp 18 | Ht 66.0 in | Wt 255.2 lb

## 2020-07-03 DIAGNOSIS — C659 Malignant neoplasm of unspecified renal pelvis: Secondary | ICD-10-CM

## 2020-07-03 DIAGNOSIS — D701 Agranulocytosis secondary to cancer chemotherapy: Secondary | ICD-10-CM | POA: Diagnosis not present

## 2020-07-03 DIAGNOSIS — Z5111 Encounter for antineoplastic chemotherapy: Secondary | ICD-10-CM | POA: Diagnosis not present

## 2020-07-03 DIAGNOSIS — R6889 Other general symptoms and signs: Secondary | ICD-10-CM | POA: Diagnosis not present

## 2020-07-03 DIAGNOSIS — C651 Malignant neoplasm of right renal pelvis: Secondary | ICD-10-CM | POA: Diagnosis not present

## 2020-07-03 DIAGNOSIS — Z23 Encounter for immunization: Secondary | ICD-10-CM | POA: Diagnosis not present

## 2020-07-03 LAB — CBC WITH DIFFERENTIAL (CANCER CENTER ONLY)
Abs Immature Granulocytes: 0.12 10*3/uL — ABNORMAL HIGH (ref 0.00–0.07)
Basophils Absolute: 0.1 10*3/uL (ref 0.0–0.1)
Basophils Relative: 1 %
Eosinophils Absolute: 0 10*3/uL (ref 0.0–0.5)
Eosinophils Relative: 1 %
HCT: 32.9 % — ABNORMAL LOW (ref 36.0–46.0)
Hemoglobin: 10.9 g/dL — ABNORMAL LOW (ref 12.0–15.0)
Immature Granulocytes: 2 %
Lymphocytes Relative: 25 %
Lymphs Abs: 1.9 10*3/uL (ref 0.7–4.0)
MCH: 31.4 pg (ref 26.0–34.0)
MCHC: 33.1 g/dL (ref 30.0–36.0)
MCV: 94.8 fL (ref 80.0–100.0)
Monocytes Absolute: 1.1 10*3/uL — ABNORMAL HIGH (ref 0.1–1.0)
Monocytes Relative: 15 %
Neutro Abs: 4.2 10*3/uL (ref 1.7–7.7)
Neutrophils Relative %: 56 %
Platelet Count: 350 10*3/uL (ref 150–400)
RBC: 3.47 MIL/uL — ABNORMAL LOW (ref 3.87–5.11)
RDW: 13.6 % (ref 11.5–15.5)
WBC Count: 7.4 10*3/uL (ref 4.0–10.5)
nRBC: 0.5 % — ABNORMAL HIGH (ref 0.0–0.2)

## 2020-07-03 LAB — CMP (CANCER CENTER ONLY)
ALT: 17 U/L (ref 0–44)
AST: 15 U/L (ref 15–41)
Albumin: 3.4 g/dL — ABNORMAL LOW (ref 3.5–5.0)
Alkaline Phosphatase: 73 U/L (ref 38–126)
Anion gap: 7 (ref 5–15)
BUN: 25 mg/dL — ABNORMAL HIGH (ref 8–23)
CO2: 28 mmol/L (ref 22–32)
Calcium: 8.8 mg/dL — ABNORMAL LOW (ref 8.9–10.3)
Chloride: 104 mmol/L (ref 98–111)
Creatinine: 1.51 mg/dL — ABNORMAL HIGH (ref 0.44–1.00)
GFR, Estimated: 37 mL/min — ABNORMAL LOW (ref >60)
Glucose, Bld: 105 mg/dL — ABNORMAL HIGH (ref 70–99)
Potassium: 4.2 mmol/L (ref 3.5–5.1)
Sodium: 139 mmol/L (ref 135–145)
Total Bilirubin: 0.3 mg/dL (ref 0.3–1.2)
Total Protein: 6.4 g/dL — ABNORMAL LOW (ref 6.5–8.1)

## 2020-07-03 LAB — MAGNESIUM: Magnesium: 1.9 mg/dL (ref 1.7–2.4)

## 2020-07-03 MED ORDER — ONDANSETRON HCL 4 MG PO TABS: 4.0000 mg | ORAL_TABLET | Freq: Every day | ORAL | 1 refills | Status: DC | PRN

## 2020-07-03 MED ORDER — SENNOSIDES-DOCUSATE SODIUM 8.6-50 MG PO TABS: 1.0000 | ORAL_TABLET | Freq: Every day | ORAL | 3 refills | Status: DC

## 2020-07-03 NOTE — Progress Notes (Signed)
Hematology and Oncology Follow Up Visit  Maria Marquez [id number] Apr 24, 1946 74 y.o. 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   Secondary diagnosis: 1.3 x 1.1 cm right suprahilar spiculated mass of the lung.  Prior Therapy:   She is status post cystoscopy and urethroscopy and a biopsy of her renal pelvic mass completed by Dr. Lovena Marquez in April 18, 2020.  This showed high-grade urothelial carcinoma.  She is status post bronchoscopy and a biopsy of her right upper lung mass which was not diagnostic.  Current therapy: Gemcitabine and cisplatin chemotherapy started on June 13, 2020.  She is here for cycle 2 of therapy.  Interim History: Maria Marquez returns today for a follow-up visit.  Since the last visit, she received the first cycle of chemotherapy which she tolerated without any major complications.  She did report some residual nausea but no vomiting.  Despite the nausea she is able to eat and has gained weight.  She denies any abdominal pain or distention.  She denies any worsening neuropathy.  She reported some constipation issue however.     Medications: I have reviewed the patient's current medications.  Current Outpatient Medications  Medication Sig Dispense Refill  . acetaminophen (TYLENOL) 500 MG tablet Take 1,000 mg by mouth every 8 (eight) hours as needed for moderate pain.     [name] albuterol (VENTOLIN HFA) 108 (90 Base) MCG/ACT inhaler Inhale 1-2 puffs into the lungs every 6 (six) hours as needed for wheezing or shortness of breath. 18 g 0  . dexamethasone (DECADRON) 4 MG tablet Take 2 tablets (8 mg total) by mouth 2 (two) times daily. Take day 2 and 3 after every chemotherapy dose, then only as needed 24 tablet 1  . EPINEPHrine (EPIPEN 2-PAK) 0.3 mg/0.3 mL IJ SOAJ injection Inject 0.3 mg into the muscle as needed for  anaphylaxis.  (Patient not taking: Reported on 06/27/2020)    . estradiol-norethindrone (ACTIVELLA) 1-0.5 MG tablet Take 1 tablet by mouth daily. 84 tablet 3  . ezetimibe (ZETIA) 10 MG tablet TAKE 1 TABLET EVERY DAY (Patient taking differently: Take 10 mg by mouth daily. ) 90 tablet 3  . levofloxacin (LEVAQUIN) 250 MG tablet Take 1 tablet (250 mg total) by mouth daily. 6 tablet 0  . levothyroxine (SYNTHROID) 137 MCG tablet TAKE 1 TABLET (137 MCG TOTAL) BY MOUTH AT BEDTIME. 90 tablet 3  . lidocaine-prilocaine (EMLA) cream Apply 1 application topically as needed. 30 g 0  . LORazepam (ATIVAN) 0.5 MG tablet Take 1 tablet (0.5 mg total) by mouth at bedtime as needed for anxiety. 20 tablet 0  . losartan-hydrochlorothiazide (HYZAAR) 50-12.5 MG tablet TAKE 1 TABLET EVERY DAY (Patient taking differently: Take 1 tablet by mouth daily. ) 90 tablet 3  . ondansetron (ZOFRAN) 4 MG tablet Take 1 tablet (4 mg total) by mouth daily as needed for nausea or vomiting. 30 tablet 1  . prochlorperazine (COMPAZINE) 10 MG tablet Take 4 times a day starting the day after chemotherapy, repeat the day after that, then take 4 times a day as needed for nausea 30 tablet 0  . simvastatin (ZOCOR) 10 MG tablet TAKE 1 TABLET (10 MG TOTAL) BY MOUTH 2 (TWO) TIMES A WEEK. (Patient taking differently: Take 10 mg by mouth 2 (two) times a week. Saturday and Sunday) 26 tablet 3  . TURMERIC CURCUMIN PO Take 2,250 mg by mouth  daily. (Patient not taking: Reported on 06/05/2020)    . XIIDRA 5 % SOLN Place 1 drop into both eyes daily as needed (dry eyes).      No current facility-administered medications for this visit.     Allergies:  Allergies  Allergen Reactions  . Iodine Shortness Of Breath  . Keflex [Cephalexin] Shortness Of Breath, Rash and Tinitus  . Lasix [Furosemide] Itching  . Rosuvastatin Other (See Comments)    Muscle Pain in Thighs  . Shellfish Allergy Hives  . Sulfa Antibiotics Nausea Only  . Latex Rash  . Lisinopril  Cough  . Tramadol Itching      Physical Exam: Blood pressure (!) 147/50, pulse 80, temperature (!) 97.2 F (36.2 C), temperature source Tympanic, resp. rate 18, height 5\' 6"  (1.676 m), weight 255 lb 3.2 oz (115.8 kg), SpO2 100 %.   ECOG: 0   General appearance: Comfortable appearing without any discomfort Head: Normocephalic without any trauma Oropharynx: Mucous membranes are moist and pink without any thrush or ulcers. Eyes: Pupils are equal and round reactive to light. Lymph nodes: No cervical, supraclavicular, inguinal or axillary lymphadenopathy.   Heart:regular rate and rhythm.  S1 and S2 without leg edema. Lung: Clear without any rhonchi or wheezes.  No dullness to percussion. Abdomin: Soft, nontender, nondistended with good bowel sounds.  No hepatosplenomegaly. Musculoskeletal: No joint deformity or effusion.  Full range of motion noted. Neurological: No deficits noted on motor, sensory and deep tendon reflex exam. Skin: No petechial rash or dryness.  Appeared moist.      Lab Results: Lab Results  Component Value Date   WBC 7.4 07/03/2020   HGB 10.9 (L) 07/03/2020   HCT 32.9 (L) 07/03/2020   MCV 94.8 07/03/2020   PLT 350 07/03/2020     Chemistry      Component Value Date/Time   NA 137 06/20/2020 0920   NA 140 12/19/2019 1132   K 4.2 06/20/2020 0920   CL 102 06/20/2020 0920   CO2 25 06/20/2020 0920   BUN 29 (H) 06/20/2020 0920   BUN 25 12/19/2019 1132   CREATININE 1.44 (H) 06/20/2020 0920   GLU 92 02/04/2016 0000      Component Value Date/Time   CALCIUM 10.1 06/20/2020 0920   ALKPHOS 80 06/20/2020 0920   AST 12 (L) 06/20/2020 0920   ALT 19 06/20/2020 0920   BILITOT 0.6 06/20/2020 0920        Impression and Plan:  74 year old woman with  1.    Right renal pelvis high-grade urothelial carcinoma diagnosed in August 2021.    PET CT scan obtained on October 11 did not show any evidence of metastatic disease.  The natural course of her disease was  reviewed and treatment options were reiterated.  She is currently receiving neoadjuvant chemotherapy in preparation for possible nephro ureterectomy.  Alternative treatment options would be to proceed with surgery upfront but that would be less advisable given her pulmonary nodule that is not further characterized at this time.  She is agreeable to proceed at this time. 2. IV access: Port-A-Cath inserted and currently in use without any issues.  3. Antiemetics: She is using the Zofran which I asked her to schedule around-the-clock after chemotherapy which she is doing.   4. Renal function surveillance:We will continue to monitor kidney function closely at this time.  Creatinine clearance is around 40 cc/min.   5. Goals of care:Aggressive measures are warranted and potential curative options exist for her malignancy at this time.  6.  Pulmonary nodule: This is likely a primary lung neoplasm other than metastatic disease.  Plan is to repeat imaging studies after the conclusion of chemotherapy to assess options moving forward.  7. Follow-up: In 1 week for completion of cycle 2 and in 3 weeks for the start of cycle 3.  30  minutes were dedicated to this encounter.  Time was dedicated to reviewing her disease status, reviewing imaging studies as well as addressing treatment options and complications of therapy.   Zola Button, MD 11/11/202112:49 PM

## 2020-07-04 ENCOUNTER — Inpatient Hospital Stay: Payer: Medicare HMO | Admitting: Nutrition

## 2020-07-04 ENCOUNTER — Encounter: Admitting: Family Medicine

## 2020-07-04 ENCOUNTER — Inpatient Hospital Stay: Payer: Medicare HMO

## 2020-07-04 ENCOUNTER — Other Ambulatory Visit: Payer: Self-pay

## 2020-07-04 VITALS — BP 128/60 | HR 70 | Temp 97.9°F | Resp 18

## 2020-07-04 DIAGNOSIS — D701 Agranulocytosis secondary to cancer chemotherapy: Secondary | ICD-10-CM | POA: Diagnosis not present

## 2020-07-04 DIAGNOSIS — C659 Malignant neoplasm of unspecified renal pelvis: Secondary | ICD-10-CM

## 2020-07-04 DIAGNOSIS — C651 Malignant neoplasm of right renal pelvis: Secondary | ICD-10-CM | POA: Diagnosis not present

## 2020-07-04 DIAGNOSIS — Z5111 Encounter for antineoplastic chemotherapy: Secondary | ICD-10-CM | POA: Diagnosis not present

## 2020-07-04 DIAGNOSIS — R6889 Other general symptoms and signs: Secondary | ICD-10-CM | POA: Diagnosis not present

## 2020-07-04 DIAGNOSIS — Z23 Encounter for immunization: Secondary | ICD-10-CM | POA: Diagnosis not present

## 2020-07-04 MED ORDER — SODIUM CHLORIDE 0.9 % IV SOLN
Freq: Once | INTRAVENOUS | Status: AC
  Filled 2020-07-04: qty 10

## 2020-07-04 MED ORDER — HEPARIN SOD (PORK) LOCK FLUSH 100 UNIT/ML IV SOLN
500.0000 [IU] | Freq: Once | INTRAVENOUS | Status: AC | PRN
  Administered 2020-07-04: 500 [IU]
  Filled 2020-07-04: qty 5

## 2020-07-04 MED ORDER — SODIUM CHLORIDE 0.9 % IV SOLN
150.0000 mg | Freq: Once | INTRAVENOUS | Status: AC
  Administered 2020-07-04: 150 mg via INTRAVENOUS
  Filled 2020-07-04: qty 150

## 2020-07-04 MED ORDER — ONDANSETRON HCL 4 MG PO TABS: 4.0000 mg | ORAL_TABLET | ORAL | 1 refills | Status: DC | PRN

## 2020-07-04 MED ORDER — SODIUM CHLORIDE 0.9 % IV SOLN
10.0000 mg | Freq: Once | INTRAVENOUS | Status: AC
  Administered 2020-07-04: 10 mg via INTRAVENOUS
  Filled 2020-07-04: qty 10

## 2020-07-04 MED ORDER — PALONOSETRON HCL INJECTION 0.25 MG/5ML
INTRAVENOUS | Status: AC
  Filled 2020-07-04: qty 5

## 2020-07-04 MED ORDER — SODIUM CHLORIDE 0.9 % IV SOLN
800.0000 mg/m2 | Freq: Once | INTRAVENOUS | Status: AC
  Administered 2020-07-04: 1824 mg via INTRAVENOUS
  Filled 2020-07-04: qty 47.97

## 2020-07-04 MED ORDER — PALONOSETRON HCL INJECTION 0.25 MG/5ML
0.2500 mg | Freq: Once | INTRAVENOUS | Status: AC
  Administered 2020-07-04: 0.25 mg via INTRAVENOUS

## 2020-07-04 MED ORDER — SODIUM CHLORIDE 0.9 % IV SOLN
Freq: Once | INTRAVENOUS | Status: AC
  Filled 2020-07-04: qty 250

## 2020-07-04 MED ORDER — SODIUM CHLORIDE 0.9 % IV SOLN
35.0000 mg/m2 | Freq: Once | INTRAVENOUS | Status: AC
  Administered 2020-07-04: 80 mg via INTRAVENOUS
  Filled 2020-07-04: qty 80

## 2020-07-04 MED ORDER — SODIUM CHLORIDE 0.9% FLUSH
10.0000 mL | INTRAVENOUS | Status: DC | PRN
  Administered 2020-07-04: 3 mL
  Filled 2020-07-04: qty 10

## 2020-07-04 NOTE — Progress Notes (Signed)
Patient requested RD visit.  74 year old female diagnosed with urethral renal cancer receiving cycle 2 of cisplatin and gemcitabine today.  Past medical history includes chronic renal insufficiency, hypertension, prediabetes, GERD.  Medications include Zofran, Compazine, turmeric, Synthroid, Ativan, and senna.  Labs include glucose 105, BUN 25, creatinine 1.51, albumin 3.4.  Height: 5 feet 6 inches. Weight: 255.2 pounds. Usual body weight: 245 pounds. BMI: 41.19.  Patient reports she has questions about nutrition. She has gained weight since beginning her treatment. Reports steroids caused increased appetite. Reports she was seeing a weight loss physician prior to diagnosis. Currently nausea has resolved however she did experience some after the first cycle of cisplatin. She drinks one protein shake a day which provides an additional 20 g of protein.  Nutrition diagnosis: Food and nutrition related knowledge deficit related to cancer and associated treatments as evidenced by no prior need for nutrition related information.  Intervention: Patient educated on strategies for smaller more frequent meals and snacks with adequate calories and protein for weight maintenance. Discouraged intentional weight loss during treatment. Emphasized importance of adequate nutrition. Discussed healthy diet with fruits, vegetables, and lean protein. Continue protein shake, one carton daily. Educated frequently on strategies for minimizing nausea and vomiting. Provided fact sheet.  Monitoring, evaluation, goals: Patient will tolerate adequate calories and protein to minimize weight loss throughout treatment.  Next visit: No follow-up is scheduled at this time. Patient provided with contact information for questions or concerns.  **Disclaimer: This note was dictated with voice recognition software. Similar sounding words can inadvertently be transcribed and this note may contain transcription errors which  may not have been corrected upon publication of note.**

## 2020-07-04 NOTE — Progress Notes (Signed)
Per Dr. Alen Blew, ok to treat with Scr 1.51.

## 2020-07-04 NOTE — Patient Instructions (Signed)
[facility] Discharge Instructions for Patients Receiving Chemotherapy  Today you received the following chemotherapy agents cisplatin; gemzar  To help prevent nausea and vomiting after your treatment, we encourage you to take your nausea medication as directed   If you develop nausea and vomiting that is not controlled by your nausea medication, call the clinic.   BELOW ARE SYMPTOMS THAT SHOULD BE REPORTED IMMEDIATELY:  *FEVER GREATER THAN 100.5 F  *CHILLS WITH OR WITHOUT FEVER  NAUSEA AND VOMITING THAT IS NOT CONTROLLED WITH YOUR NAUSEA MEDICATION  *UNUSUAL SHORTNESS OF BREATH  *UNUSUAL BRUISING OR BLEEDING  TENDERNESS IN MOUTH AND THROAT WITH OR WITHOUT PRESENCE OF ULCERS  *URINARY PROBLEMS  *BOWEL PROBLEMS  UNUSUAL RASH Items with * indicate a potential emergency and should be followed up as soon as possible.  Feel free to call the clinic should you have any questions or concerns. The clinic phone number is [phone].  Please show the [facility] at check-in to the Emergency Department and triage nurse.

## 2020-07-04 NOTE — Progress Notes (Signed)
zofr

## 2020-07-07 ENCOUNTER — Encounter: Admitting: Family Medicine

## 2020-07-09 ENCOUNTER — Ambulatory Visit: Admitting: Emergency Medicine

## 2020-07-09 ENCOUNTER — Telehealth: Payer: Self-pay | Admitting: Oncology

## 2020-07-09 NOTE — Telephone Encounter (Signed)
Scheduled per 11/11 los, patient has been called and notified.

## 2020-07-11 ENCOUNTER — Inpatient Hospital Stay: Payer: Medicare HMO

## 2020-07-11 ENCOUNTER — Other Ambulatory Visit: Payer: Self-pay

## 2020-07-11 VITALS — BP 144/71 | HR 65 | Temp 98.5°F | Resp 18 | Ht 66.0 in | Wt 254.0 lb

## 2020-07-11 DIAGNOSIS — R6889 Other general symptoms and signs: Secondary | ICD-10-CM | POA: Diagnosis not present

## 2020-07-11 DIAGNOSIS — D701 Agranulocytosis secondary to cancer chemotherapy: Secondary | ICD-10-CM | POA: Diagnosis not present

## 2020-07-11 DIAGNOSIS — C659 Malignant neoplasm of unspecified renal pelvis: Secondary | ICD-10-CM

## 2020-07-11 DIAGNOSIS — Z23 Encounter for immunization: Secondary | ICD-10-CM | POA: Diagnosis not present

## 2020-07-11 DIAGNOSIS — Z5111 Encounter for antineoplastic chemotherapy: Secondary | ICD-10-CM | POA: Diagnosis not present

## 2020-07-11 DIAGNOSIS — Z95828 Presence of other vascular implants and grafts: Secondary | ICD-10-CM

## 2020-07-11 DIAGNOSIS — C651 Malignant neoplasm of right renal pelvis: Secondary | ICD-10-CM | POA: Diagnosis not present

## 2020-07-11 LAB — CBC WITH DIFFERENTIAL (CANCER CENTER ONLY)
Abs Immature Granulocytes: 0.27 10*3/uL — ABNORMAL HIGH (ref 0.00–0.07)
Basophils Absolute: 0.1 10*3/uL (ref 0.0–0.1)
Basophils Relative: 1 %
Eosinophils Absolute: 0 10*3/uL (ref 0.0–0.5)
Eosinophils Relative: 1 %
HCT: 33.7 % — ABNORMAL LOW (ref 36.0–46.0)
Hemoglobin: 11.4 g/dL — ABNORMAL LOW (ref 12.0–15.0)
Immature Granulocytes: 5 %
Lymphocytes Relative: 36 %
Lymphs Abs: 2 10*3/uL (ref 0.7–4.0)
MCH: 31.5 pg (ref 26.0–34.0)
MCHC: 33.8 g/dL (ref 30.0–36.0)
MCV: 93.1 fL (ref 80.0–100.0)
Monocytes Absolute: 0.3 10*3/uL (ref 0.1–1.0)
Monocytes Relative: 6 %
Neutro Abs: 3 10*3/uL (ref 1.7–7.7)
Neutrophils Relative %: 51 %
Platelet Count: 376 10*3/uL (ref 150–400)
RBC: 3.62 MIL/uL — ABNORMAL LOW (ref 3.87–5.11)
RDW: 13.4 % (ref 11.5–15.5)
WBC Count: 5.6 10*3/uL (ref 4.0–10.5)
nRBC: 0.9 % — ABNORMAL HIGH (ref 0.0–0.2)

## 2020-07-11 LAB — CMP (CANCER CENTER ONLY)
ALT: 19 U/L (ref 0–44)
AST: 16 U/L (ref 15–41)
Albumin: 3.6 g/dL (ref 3.5–5.0)
Alkaline Phosphatase: 66 U/L (ref 38–126)
Anion gap: 9 (ref 5–15)
BUN: 28 mg/dL — ABNORMAL HIGH (ref 8–23)
CO2: 27 mmol/L (ref 22–32)
Calcium: 9.4 mg/dL (ref 8.9–10.3)
Chloride: 101 mmol/L (ref 98–111)
Creatinine: 1.59 mg/dL — ABNORMAL HIGH (ref 0.44–1.00)
GFR, Estimated: 34 mL/min — ABNORMAL LOW (ref >60)
Glucose, Bld: 91 mg/dL (ref 70–99)
Potassium: 4.2 mmol/L (ref 3.5–5.1)
Sodium: 137 mmol/L (ref 135–145)
Total Bilirubin: 0.4 mg/dL (ref 0.3–1.2)
Total Protein: 6.7 g/dL (ref 6.5–8.1)

## 2020-07-11 MED ORDER — SODIUM CHLORIDE 0.9 % IV SOLN
Freq: Once | INTRAVENOUS | Status: AC
  Filled 2020-07-11: qty 250

## 2020-07-11 MED ORDER — HEPARIN SOD (PORK) LOCK FLUSH 100 UNIT/ML IV SOLN
500.0000 [IU] | Freq: Once | INTRAVENOUS | Status: AC | PRN
  Administered 2020-07-11: 500 [IU]
  Filled 2020-07-11: qty 5

## 2020-07-11 MED ORDER — INFLUENZA VAC A&B SA ADJ QUAD 0.5 ML IM PRSY
PREFILLED_SYRINGE | INTRAMUSCULAR | Status: AC
  Filled 2020-07-11: qty 0.5

## 2020-07-11 MED ORDER — SODIUM CHLORIDE 0.9 % IV SOLN
800.0000 mg/m2 | Freq: Once | INTRAVENOUS | Status: AC
  Administered 2020-07-11: 1824 mg via INTRAVENOUS
  Filled 2020-07-11: qty 47.97

## 2020-07-11 MED ORDER — SODIUM CHLORIDE 0.9% FLUSH
10.0000 mL | INTRAVENOUS | Status: DC | PRN
  Administered 2020-07-11: 10 mL
  Filled 2020-07-11: qty 10

## 2020-07-11 MED ORDER — SODIUM CHLORIDE 0.9% FLUSH
10.0000 mL | Freq: Once | INTRAVENOUS | Status: AC
  Administered 2020-07-11: 10 mL
  Filled 2020-07-11: qty 10

## 2020-07-11 MED ORDER — PROCHLORPERAZINE MALEATE 10 MG PO TABS
ORAL_TABLET | ORAL | Status: AC
  Filled 2020-07-11: qty 1

## 2020-07-11 MED ORDER — PROCHLORPERAZINE MALEATE 10 MG PO TABS
10.0000 mg | ORAL_TABLET | Freq: Once | ORAL | Status: AC
  Administered 2020-07-11: 10 mg via ORAL

## 2020-07-11 MED ORDER — INFLUENZA VAC A&B SA ADJ QUAD 0.5 ML IM PRSY
0.5000 mL | PREFILLED_SYRINGE | Freq: Once | INTRAMUSCULAR | Status: AC
  Administered 2020-07-11: 0.5 mL via INTRAMUSCULAR

## 2020-07-11 NOTE — Progress Notes (Signed)
Per Dr. Alen Blew: okay to treat with elevated Scr. Level of 1.59.

## 2020-07-11 NOTE — Patient Instructions (Signed)
[facility] Discharge Instructions for Patients Receiving Chemotherapy  Today you received the following chemotherapy agents Gemcitabine (GEMZAR).  To help prevent nausea and vomiting after your treatment, we encourage you to take your nausea medication as prescribed.   If you develop nausea and vomiting that is not controlled by your nausea medication, call the clinic.   BELOW ARE SYMPTOMS THAT SHOULD BE REPORTED IMMEDIATELY:  *FEVER GREATER THAN 100.5 F  *CHILLS WITH OR WITHOUT FEVER  NAUSEA AND VOMITING THAT IS NOT CONTROLLED WITH YOUR NAUSEA MEDICATION  *UNUSUAL SHORTNESS OF BREATH  *UNUSUAL BRUISING OR BLEEDING  TENDERNESS IN MOUTH AND THROAT WITH OR WITHOUT PRESENCE OF ULCERS  *URINARY PROBLEMS  *BOWEL PROBLEMS  UNUSUAL RASH Items with * indicate a potential emergency and should be followed up as soon as possible.  Feel free to call the clinic should you have any questions or concerns. The clinic phone number is [phone].  Please show the CHEMO ALERT CARD at check-in to the Emergency Department and triage nurse.   

## 2020-07-18 ENCOUNTER — Ambulatory Visit: Admitting: Oncology

## 2020-07-18 ENCOUNTER — Other Ambulatory Visit

## 2020-07-18 ENCOUNTER — Ambulatory Visit

## 2020-07-25 ENCOUNTER — Ambulatory Visit

## 2020-07-25 ENCOUNTER — Inpatient Hospital Stay: Payer: Medicare HMO | Attending: Oncology

## 2020-07-25 ENCOUNTER — Inpatient Hospital Stay (HOSPITAL_BASED_OUTPATIENT_CLINIC_OR_DEPARTMENT_OTHER): Payer: Medicare HMO | Admitting: Oncology

## 2020-07-25 ENCOUNTER — Inpatient Hospital Stay: Payer: Medicare HMO

## 2020-07-25 ENCOUNTER — Other Ambulatory Visit: Payer: Self-pay

## 2020-07-25 ENCOUNTER — Other Ambulatory Visit

## 2020-07-25 VITALS — BP 142/75 | HR 72 | Temp 97.9°F | Resp 18 | Ht 66.0 in | Wt 252.2 lb

## 2020-07-25 DIAGNOSIS — T451X5A Adverse effect of antineoplastic and immunosuppressive drugs, initial encounter: Secondary | ICD-10-CM | POA: Diagnosis not present

## 2020-07-25 DIAGNOSIS — R918 Other nonspecific abnormal finding of lung field: Secondary | ICD-10-CM | POA: Diagnosis not present

## 2020-07-25 DIAGNOSIS — D701 Agranulocytosis secondary to cancer chemotherapy: Secondary | ICD-10-CM | POA: Diagnosis not present

## 2020-07-25 DIAGNOSIS — Z95828 Presence of other vascular implants and grafts: Secondary | ICD-10-CM

## 2020-07-25 DIAGNOSIS — Z5111 Encounter for antineoplastic chemotherapy: Secondary | ICD-10-CM | POA: Insufficient documentation

## 2020-07-25 DIAGNOSIS — C659 Malignant neoplasm of unspecified renal pelvis: Secondary | ICD-10-CM

## 2020-07-25 DIAGNOSIS — C651 Malignant neoplasm of right renal pelvis: Secondary | ICD-10-CM | POA: Diagnosis not present

## 2020-07-25 LAB — CMP (CANCER CENTER ONLY)
ALT: 12 U/L (ref 0–44)
AST: 15 U/L (ref 15–41)
Albumin: 3.4 g/dL — ABNORMAL LOW (ref 3.5–5.0)
Alkaline Phosphatase: 64 U/L (ref 38–126)
Anion gap: 10 (ref 5–15)
BUN: 22 mg/dL (ref 8–23)
CO2: 25 mmol/L (ref 22–32)
Calcium: 8.7 mg/dL — ABNORMAL LOW (ref 8.9–10.3)
Chloride: 105 mmol/L (ref 98–111)
Creatinine: 1.58 mg/dL — ABNORMAL HIGH (ref 0.44–1.00)
GFR, Estimated: 35 mL/min — ABNORMAL LOW (ref >60)
Glucose, Bld: 136 mg/dL — ABNORMAL HIGH (ref 70–99)
Potassium: 4 mmol/L (ref 3.5–5.1)
Sodium: 140 mmol/L (ref 135–145)
Total Bilirubin: 0.5 mg/dL (ref 0.3–1.2)
Total Protein: 6.4 g/dL — ABNORMAL LOW (ref 6.5–8.1)

## 2020-07-25 LAB — CBC WITH DIFFERENTIAL (CANCER CENTER ONLY)
Abs Immature Granulocytes: 0.07 10*3/uL (ref 0.00–0.07)
Basophils Absolute: 0 10*3/uL (ref 0.0–0.1)
Basophils Relative: 1 %
Eosinophils Absolute: 0.1 10*3/uL (ref 0.0–0.5)
Eosinophils Relative: 2 %
HCT: 31.3 % — ABNORMAL LOW (ref 36.0–46.0)
Hemoglobin: 10.3 g/dL — ABNORMAL LOW (ref 12.0–15.0)
Immature Granulocytes: 1 %
Lymphocytes Relative: 24 %
Lymphs Abs: 1.5 10*3/uL (ref 0.7–4.0)
MCH: 31.6 pg (ref 26.0–34.0)
MCHC: 32.9 g/dL (ref 30.0–36.0)
MCV: 96 fL (ref 80.0–100.0)
Monocytes Absolute: 1.1 10*3/uL — ABNORMAL HIGH (ref 0.1–1.0)
Monocytes Relative: 17 %
Neutro Abs: 3.5 10*3/uL (ref 1.7–7.7)
Neutrophils Relative %: 55 %
Platelet Count: 250 10*3/uL (ref 150–400)
RBC: 3.26 MIL/uL — ABNORMAL LOW (ref 3.87–5.11)
RDW: 15.9 % — ABNORMAL HIGH (ref 11.5–15.5)
WBC Count: 6.3 10*3/uL (ref 4.0–10.5)
nRBC: 1 % — ABNORMAL HIGH (ref 0.0–0.2)

## 2020-07-25 MED ORDER — HEPARIN SOD (PORK) LOCK FLUSH 100 UNIT/ML IV SOLN
500.0000 [IU] | Freq: Once | INTRAVENOUS | Status: AC | PRN
  Administered 2020-07-25: 500 [IU]
  Filled 2020-07-25: qty 5

## 2020-07-25 MED ORDER — PALONOSETRON HCL INJECTION 0.25 MG/5ML
0.2500 mg | Freq: Once | INTRAVENOUS | Status: AC
  Administered 2020-07-25: 0.25 mg via INTRAVENOUS

## 2020-07-25 MED ORDER — SODIUM CHLORIDE 0.9% FLUSH
10.0000 mL | Freq: Once | INTRAVENOUS | Status: AC
  Administered 2020-07-25: 10 mL
  Filled 2020-07-25: qty 10

## 2020-07-25 MED ORDER — SODIUM CHLORIDE 0.9 % IV SOLN
800.0000 mg/m2 | Freq: Once | INTRAVENOUS | Status: AC
  Administered 2020-07-25: 1824 mg via INTRAVENOUS
  Filled 2020-07-25: qty 47.97

## 2020-07-25 MED ORDER — SODIUM CHLORIDE 0.9 % IV SOLN
10.0000 mg | Freq: Once | INTRAVENOUS | Status: AC
  Administered 2020-07-25: 10 mg via INTRAVENOUS
  Filled 2020-07-25: qty 10

## 2020-07-25 MED ORDER — SODIUM CHLORIDE 0.9 % IV SOLN
Freq: Once | INTRAVENOUS | Status: AC
  Filled 2020-07-25: qty 10

## 2020-07-25 MED ORDER — SODIUM CHLORIDE 0.9 % IV SOLN
150.0000 mg | Freq: Once | INTRAVENOUS | Status: AC
  Administered 2020-07-25: 150 mg via INTRAVENOUS
  Filled 2020-07-25: qty 150

## 2020-07-25 MED ORDER — PALONOSETRON HCL INJECTION 0.25 MG/5ML
INTRAVENOUS | Status: AC
  Filled 2020-07-25: qty 5

## 2020-07-25 MED ORDER — SODIUM CHLORIDE 0.9 % IV SOLN
35.0000 mg/m2 | Freq: Once | INTRAVENOUS | Status: AC
  Administered 2020-07-25: 80 mg via INTRAVENOUS
  Filled 2020-07-25: qty 80

## 2020-07-25 MED ORDER — SODIUM CHLORIDE 0.9% FLUSH
10.0000 mL | INTRAVENOUS | Status: DC | PRN
  Administered 2020-07-25: 10 mL
  Filled 2020-07-25: qty 10

## 2020-07-25 MED ORDER — SODIUM CHLORIDE 0.9 % IV SOLN
Freq: Once | INTRAVENOUS | Status: AC
  Filled 2020-07-25: qty 250

## 2020-07-25 NOTE — Progress Notes (Signed)
Per Dr Alen Blew, ok to treat with creatinine 1.58  Patient completed infusion without incident. In no visible distress at time of discharge. Ambulated out of cancer center. AVS declined.

## 2020-07-25 NOTE — Patient Instructions (Signed)
[facility] Discharge Instructions for Patients Receiving Chemotherapy  Today you received the following chemotherapy agents: gemcitabine/cisplatin.  To help prevent nausea and vomiting after your treatment, we encourage you to take your nausea medication as directed.   If you develop nausea and vomiting that is not controlled by your nausea medication, call the clinic.   BELOW ARE SYMPTOMS THAT SHOULD BE REPORTED IMMEDIATELY:  *FEVER GREATER THAN 100.5 F  *CHILLS WITH OR WITHOUT FEVER  NAUSEA AND VOMITING THAT IS NOT CONTROLLED WITH YOUR NAUSEA MEDICATION  *UNUSUAL SHORTNESS OF BREATH  *UNUSUAL BRUISING OR BLEEDING  TENDERNESS IN MOUTH AND THROAT WITH OR WITHOUT PRESENCE OF ULCERS  *URINARY PROBLEMS  *BOWEL PROBLEMS  UNUSUAL RASH Items with * indicate a potential emergency and should be followed up as soon as possible.  Feel free to call the clinic should you have any questions or concerns. The clinic phone number is [phone].  Please show the [facility] at check-in to the Emergency Department and triage nurse.

## 2020-07-25 NOTE — Progress Notes (Signed)
Hematology and Oncology Follow Up Visit  Maria Marquez [id number] 04/12/1946 74 y.o. 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 No evidence of documented local lymphadenopathy.   Secondary diagnosis: 1.3 x 1.1 cm right suprahilar spiculated mass of the lung.  Prior Therapy:   She is status post cystoscopy and urethroscopy and a biopsy of her renal pelvic mass completed by Dr. Lovena Neighbours in April 18, 2020.  This showed high-grade urothelial carcinoma.  She is status post bronchoscopy and a biopsy of her right upper lung mass which was not diagnostic.  Current therapy: Gemcitabine and cisplatin chemotherapy started on June 13, 2020.  She is here for day 1 cycle 3 of therapy.  Interim History: Maria Marquez presents today for return evaluation. Since her last visit, she reports no major changes in her health.  She tolerates chemotherapy without any recent complaints.  She does have baseline nausea that is manageable with Zofran.  She is not reporting any vomiting or shortness of breath.  She denies any difficulty breathing or failure to thrive.  She continues to attempt activities of daily living.     Medications: Updated on review. Current Outpatient Medications  Medication Sig Dispense Refill  . acetaminophen (TYLENOL) 500 MG tablet Take 1,000 mg by mouth every 8 (eight) hours as needed for moderate pain.     [name] albuterol (VENTOLIN HFA) 108 (90 Base) MCG/ACT inhaler Inhale 1-2 puffs into the lungs every 6 (six) hours as needed for wheezing or shortness of breath. 18 g 0  . dexamethasone (DECADRON) 4 MG tablet Take 2 tablets (8 mg total) by mouth 2 (two) times daily. Take day 2 and 3 after every chemotherapy dose, then only as needed 24 tablet 1  . EPINEPHrine (EPIPEN 2-PAK) 0.3 mg/0.3 mL IJ SOAJ injection Inject 0.3 mg into the muscle as  needed for anaphylaxis.  (Patient not taking: Reported on 06/27/2020)    . estradiol-norethindrone (ACTIVELLA) 1-0.5 MG tablet Take 1 tablet by mouth daily. 84 tablet 3  . ezetimibe (ZETIA) 10 MG tablet TAKE 1 TABLET EVERY DAY (Patient taking differently: Take 10 mg by mouth daily. ) 90 tablet 3  . levofloxacin (LEVAQUIN) 250 MG tablet Take 1 tablet (250 mg total) by mouth daily. 6 tablet 0  . levothyroxine (SYNTHROID) 137 MCG tablet TAKE 1 TABLET (137 MCG TOTAL) BY MOUTH AT BEDTIME. 90 tablet 3  . lidocaine-prilocaine (EMLA) cream Apply 1 application topically as needed. 30 g 0  . LORazepam (ATIVAN) 0.5 MG tablet Take 1 tablet (0.5 mg total) by mouth at bedtime as needed for anxiety. 20 tablet 0  . losartan-hydrochlorothiazide (HYZAAR) 50-12.5 MG tablet TAKE 1 TABLET EVERY DAY (Patient taking differently: Take 1 tablet by mouth daily. ) 90 tablet 3  . ondansetron (ZOFRAN) 4 MG tablet Take 1 tablet (4 mg total) by mouth every 4 (four) hours as needed for nausea or vomiting. 90 tablet 1  . prochlorperazine (COMPAZINE) 10 MG tablet Take 4 times a day starting the day after chemotherapy, repeat the day after that, then take 4 times a day as needed for nausea 30 tablet 0  . senna-docusate (SENNA S) 8.6-50 MG tablet Take 1 tablet by mouth at bedtime. 90 tablet 3  . simvastatin (ZOCOR) 10 MG tablet TAKE 1 TABLET (10 MG TOTAL) BY MOUTH 2 (TWO) TIMES A WEEK. (Patient taking differently: Take 10 mg by mouth  2 (two) times a week. Saturday and Sunday) 26 tablet 3  . TURMERIC CURCUMIN PO Take 2,250 mg by mouth daily. (Patient not taking: Reported on 06/05/2020)    . XIIDRA 5 % SOLN Place 1 drop into both eyes daily as needed (dry eyes).      No current facility-administered medications for this visit.     Allergies:  Allergies  Allergen Reactions  . Iodine Shortness Of Breath  . Keflex [Cephalexin] Shortness Of Breath, Rash and Tinitus  . Lasix [Furosemide] Itching  . Rosuvastatin Other (See Comments)     Muscle Pain in Thighs  . Shellfish Allergy Hives  . Sulfa Antibiotics Nausea Only  . Latex Rash  . Lisinopril Cough  . Tramadol Itching      Physical Exam: Blood pressure (!) 142/75, pulse 72, temperature 97.9 F (36.6 C), temperature source Tympanic, resp. rate 18, height 5\' 6"  (1.676 m), weight 252 lb 3.2 oz (114.4 kg), SpO2 100 %.   ECOG: 0    General appearance: Alert, awake without any distress. Head: Atraumatic without abnormalities Oropharynx: Without any thrush or ulcers. Eyes: No scleral icterus. Lymph nodes: No lymphadenopathy noted in the cervical, supraclavicular, or axillary nodes Heart:regular rate and rhythm, without any murmurs or gallops.   Lung: Clear to auscultation without any rhonchi, wheezes or dullness to percussion. Abdomin: Soft, nontender without any shifting dullness or ascites. Musculoskeletal: No clubbing or cyanosis. Neurological: No motor or sensory deficits. Skin: No rashes or lesions.      Lab Results: Lab Results  Component Value Date   WBC 5.6 07/11/2020   HGB 11.4 (L) 07/11/2020   HCT 33.7 (L) 07/11/2020   MCV 93.1 07/11/2020   PLT 376 07/11/2020     Chemistry      Component Value Date/Time   NA 137 07/11/2020 1307   NA 140 12/19/2019 1132   K 4.2 07/11/2020 1307   CL 101 07/11/2020 1307   CO2 27 07/11/2020 1307   BUN 28 (H) 07/11/2020 1307   BUN 25 12/19/2019 1132   CREATININE 1.59 (H) 07/11/2020 1307   GLU 92 02/04/2016 0000      Component Value Date/Time   CALCIUM 9.4 07/11/2020 1307   ALKPHOS 66 07/11/2020 1307   AST 16 07/11/2020 1307   ALT 19 07/11/2020 1307   BILITOT 0.4 07/11/2020 1307        Impression and Plan:  74 year old woman with  1.   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      She is currently receiving neoadjuvant chemotherapy utilizing cisplatin and gemcitabine without any major complications. Risks and benefits of continuing this  treatment were reviewed at this time. Tentatively completed 4 cycles of therapy and updating staging scans would be recommended. Surgical resection may be considered at that time if she has no evidence of metastatic disease.  She is agreeable with this plan.    2. IV access: Port-A-Cath remains in place without any issues.  3. Antiemetics: She continues to take Zofran around-the-clock and effective in treating her nausea.   4. Renal function surveillance:Her creatinine clearance remains close to 40 cc/min and will continue to monitor on platinum based therapy.   5. Goals of care:Her disease remains potentially curable and aggressive measures are warranted.  6.  Pulmonary nodule: Differential diagnosis includes primary lung neoplasm which is the most likely etiology. Metastatic urothelial carcinoma is considered less likely. We will update staging scans after completing chemotherapy.  7. Follow-up:  In 1 week get complete cycle 3 and in 3 weeks to start cycle 4.  30  minutes were spent on this visit. Time was dedicated to reviewing her disease status, discussing treatment options addressing complications related to her cancer and cancer therapy.   Zola Button, MD 12/3/20218:18 AM

## 2020-07-26 ENCOUNTER — Other Ambulatory Visit: Payer: Self-pay | Admitting: Family Medicine

## 2020-08-01 ENCOUNTER — Inpatient Hospital Stay: Payer: Medicare HMO

## 2020-08-01 ENCOUNTER — Other Ambulatory Visit: Payer: Self-pay

## 2020-08-01 VITALS — BP 141/64 | HR 69 | Temp 98.0°F | Resp 17 | Wt 250.0 lb

## 2020-08-01 DIAGNOSIS — Z5111 Encounter for antineoplastic chemotherapy: Secondary | ICD-10-CM | POA: Diagnosis not present

## 2020-08-01 DIAGNOSIS — C651 Malignant neoplasm of right renal pelvis: Secondary | ICD-10-CM | POA: Diagnosis not present

## 2020-08-01 DIAGNOSIS — C659 Malignant neoplasm of unspecified renal pelvis: Secondary | ICD-10-CM

## 2020-08-01 DIAGNOSIS — Z95828 Presence of other vascular implants and grafts: Secondary | ICD-10-CM

## 2020-08-01 DIAGNOSIS — R918 Other nonspecific abnormal finding of lung field: Secondary | ICD-10-CM | POA: Diagnosis not present

## 2020-08-01 LAB — CMP (CANCER CENTER ONLY)
ALT: 18 U/L (ref 0–44)
AST: 19 U/L (ref 15–41)
Albumin: 3.7 g/dL (ref 3.5–5.0)
Alkaline Phosphatase: 67 U/L (ref 38–126)
Anion gap: 11 (ref 5–15)
BUN: 23 mg/dL (ref 8–23)
CO2: 24 mmol/L (ref 22–32)
Calcium: 8.9 mg/dL (ref 8.9–10.3)
Chloride: 106 mmol/L (ref 98–111)
Creatinine: 1.86 mg/dL — ABNORMAL HIGH (ref 0.44–1.00)
GFR, Estimated: 28 mL/min — ABNORMAL LOW (ref >60)
Glucose, Bld: 127 mg/dL — ABNORMAL HIGH (ref 70–99)
Potassium: 4.4 mmol/L (ref 3.5–5.1)
Sodium: 141 mmol/L (ref 135–145)
Total Bilirubin: 0.4 mg/dL (ref 0.3–1.2)
Total Protein: 6.9 g/dL (ref 6.5–8.1)

## 2020-08-01 LAB — CBC WITH DIFFERENTIAL (CANCER CENTER ONLY)
Abs Immature Granulocytes: 0.27 10*3/uL — ABNORMAL HIGH (ref 0.00–0.07)
Basophils Absolute: 0.1 10*3/uL (ref 0.0–0.1)
Basophils Relative: 1 %
Eosinophils Absolute: 0 10*3/uL (ref 0.0–0.5)
Eosinophils Relative: 1 %
HCT: 31.4 % — ABNORMAL LOW (ref 36.0–46.0)
Hemoglobin: 10.6 g/dL — ABNORMAL LOW (ref 12.0–15.0)
Immature Granulocytes: 6 %
Lymphocytes Relative: 37 %
Lymphs Abs: 1.7 10*3/uL (ref 0.7–4.0)
MCH: 31.7 pg (ref 26.0–34.0)
MCHC: 33.8 g/dL (ref 30.0–36.0)
MCV: 94 fL (ref 80.0–100.0)
Monocytes Absolute: 0.4 10*3/uL (ref 0.1–1.0)
Monocytes Relative: 8 %
Neutro Abs: 2.2 10*3/uL (ref 1.7–7.7)
Neutrophils Relative %: 47 %
Platelet Count: 382 10*3/uL (ref 150–400)
RBC: 3.34 MIL/uL — ABNORMAL LOW (ref 3.87–5.11)
RDW: 15.4 % (ref 11.5–15.5)
WBC Count: 4.6 10*3/uL (ref 4.0–10.5)
nRBC: 1.1 % — ABNORMAL HIGH (ref 0.0–0.2)

## 2020-08-01 MED ORDER — SODIUM CHLORIDE 0.9% FLUSH
10.0000 mL | Freq: Once | INTRAVENOUS | Status: AC
  Administered 2020-08-01: 10 mL
  Filled 2020-08-01: qty 10

## 2020-08-01 MED ORDER — PROCHLORPERAZINE MALEATE 10 MG PO TABS
ORAL_TABLET | ORAL | Status: AC
  Filled 2020-08-01: qty 1

## 2020-08-01 MED ORDER — SODIUM CHLORIDE 0.9 % IV SOLN
Freq: Once | INTRAVENOUS | Status: AC
  Filled 2020-08-01: qty 250

## 2020-08-01 MED ORDER — GEMCITABINE HCL CHEMO INJECTION 1 GM/26.3ML
800.0000 mg/m2 | Freq: Once | INTRAVENOUS | Status: AC
  Administered 2020-08-01: 1824 mg via INTRAVENOUS
  Filled 2020-08-01: qty 47.97

## 2020-08-01 MED ORDER — HEPARIN SOD (PORK) LOCK FLUSH 100 UNIT/ML IV SOLN
500.0000 [IU] | Freq: Once | INTRAVENOUS | Status: DC | PRN
  Filled 2020-08-01: qty 5

## 2020-08-01 MED ORDER — PROCHLORPERAZINE MALEATE 10 MG PO TABS
10.0000 mg | ORAL_TABLET | Freq: Once | ORAL | Status: AC
  Administered 2020-08-01: 10 mg via ORAL

## 2020-08-01 MED ORDER — SODIUM CHLORIDE 0.9% FLUSH
10.0000 mL | INTRAVENOUS | Status: DC | PRN
  Filled 2020-08-01: qty 10

## 2020-08-01 NOTE — Patient Instructions (Signed)
[facility] Discharge Instructions for Patients Receiving Chemotherapy  Today you received the following chemotherapy agents Gemcitabine (GEMZAR).  To help prevent nausea and vomiting after your treatment, we encourage you to take your nausea medication as prescribed.   If you develop nausea and vomiting that is not controlled by your nausea medication, call the clinic.   BELOW ARE SYMPTOMS THAT SHOULD BE REPORTED IMMEDIATELY:  *FEVER GREATER THAN 100.5 F  *CHILLS WITH OR WITHOUT FEVER  NAUSEA AND VOMITING THAT IS NOT CONTROLLED WITH YOUR NAUSEA MEDICATION  *UNUSUAL SHORTNESS OF BREATH  *UNUSUAL BRUISING OR BLEEDING  TENDERNESS IN MOUTH AND THROAT WITH OR WITHOUT PRESENCE OF ULCERS  *URINARY PROBLEMS  *BOWEL PROBLEMS  UNUSUAL RASH Items with * indicate a potential emergency and should be followed up as soon as possible.  Feel free to call the clinic should you have any questions or concerns. The clinic phone number is [phone].  Please show the CHEMO ALERT CARD at check-in to the Emergency Department and triage nurse.   

## 2020-08-01 NOTE — Progress Notes (Signed)
Ok to treat with SCR 1.86 per Dr. Alen Blew.

## 2020-08-05 ENCOUNTER — Other Ambulatory Visit: Payer: Self-pay

## 2020-08-05 ENCOUNTER — Telehealth: Payer: Self-pay

## 2020-08-05 MED ORDER — ONDANSETRON HCL 8 MG PO TABS: 8.0000 mg | ORAL_TABLET | Freq: Three times a day (TID) | ORAL | 0 refills | Status: DC | PRN

## 2020-08-05 NOTE — Telephone Encounter (Signed)
-----   Message from [name], MD sent at 08/05/2020  3:04 PM EST ----- Please rend RX for 8 mg q8rs with enough for 3 weeks. Thanks ----- Message ----- From: Tami Lin, RN Sent: 08/05/2020   2:55 PM EST To: [name], MD  Patient has been taking Zofran 4 mg every 4 hours around the clock due to nausea. She says the pill last about 3 hours and then she is nauseous again. She wakes up around 2 am and takes a pill due to nausea. Her last prescription was for Zofran 4 mg every 4 hours prn nausea/vomiting. She said she only has 2 pills left and pharmacy will not refill because it's too early. Do you want a new prescription sent for 8 mg tid or refill 4 mg q 4 hours. She said at her last visit she discussed this with you and  thought it would be better by now but it's not. She has been pushing fluids and has tried eating/not eating at certain times and nothing has helped.  Lanelle Bal

## 2020-08-05 NOTE — Telephone Encounter (Signed)
Three week supply for Zofran sent to [facility] pharmacy.

## 2020-08-05 NOTE — Progress Notes (Signed)
z

## 2020-08-06 ENCOUNTER — Telehealth: Payer: Self-pay | Admitting: *Deleted

## 2020-08-06 NOTE — Telephone Encounter (Signed)
   No answer/Busy call to collaborative for anti-emetic order clarification sent 08/05/2020.  Prior Authorization (PA) request received by this nurse today.    Connected with pharmacy, requesting PA fr quantity limitation and directions.   PA order reads Ondansetron 4 mg tablets, take 1 tablet by mouth every 4 hours as needed for nausea of, qty: 90.    Zofran 8 mg, take 1 tablet by mouth every 8 hours as needed for nausea or vomiting, qty: 63 noted on current medication list.    Per [facility], "Ondansetron 4 mg and 8 mg are ready for patient pick-up.  No prior authorization activity required for ondansetron.  Patient cost: $13.00 per order/bottle."

## 2020-08-11 ENCOUNTER — Encounter: Admitting: Physician Assistant

## 2020-08-13 MED FILL — Dexamethasone Sodium Phosphate Inj 100 MG/10ML: INTRAMUSCULAR | Qty: 1 | Status: AC

## 2020-08-13 MED FILL — Fosaprepitant Dimeglumine For IV Infusion 150 MG (Base Eq): INTRAVENOUS | Qty: 5 | Status: AC

## 2020-08-14 ENCOUNTER — Inpatient Hospital Stay (HOSPITAL_BASED_OUTPATIENT_CLINIC_OR_DEPARTMENT_OTHER): Payer: Medicare HMO | Admitting: Oncology

## 2020-08-14 ENCOUNTER — Other Ambulatory Visit: Payer: Self-pay

## 2020-08-14 ENCOUNTER — Inpatient Hospital Stay: Payer: Medicare HMO

## 2020-08-14 VITALS — BP 143/54 | HR 82 | Temp 97.6°F | Resp 18 | Ht 66.0 in | Wt 256.5 lb

## 2020-08-14 DIAGNOSIS — Z95828 Presence of other vascular implants and grafts: Secondary | ICD-10-CM

## 2020-08-14 DIAGNOSIS — R918 Other nonspecific abnormal finding of lung field: Secondary | ICD-10-CM | POA: Diagnosis not present

## 2020-08-14 DIAGNOSIS — C659 Malignant neoplasm of unspecified renal pelvis: Secondary | ICD-10-CM

## 2020-08-14 DIAGNOSIS — Z5111 Encounter for antineoplastic chemotherapy: Secondary | ICD-10-CM | POA: Diagnosis not present

## 2020-08-14 DIAGNOSIS — C651 Malignant neoplasm of right renal pelvis: Secondary | ICD-10-CM | POA: Diagnosis not present

## 2020-08-14 LAB — CMP (CANCER CENTER ONLY)
ALT: 11 U/L (ref 0–44)
AST: 12 U/L — ABNORMAL LOW (ref 15–41)
Albumin: 3.4 g/dL — ABNORMAL LOW (ref 3.5–5.0)
Alkaline Phosphatase: 70 U/L (ref 38–126)
Anion gap: 9 (ref 5–15)
BUN: 22 mg/dL (ref 8–23)
CO2: 25 mmol/L (ref 22–32)
Calcium: 7.8 mg/dL — ABNORMAL LOW (ref 8.9–10.3)
Chloride: 104 mmol/L (ref 98–111)
Creatinine: 2 mg/dL — ABNORMAL HIGH (ref 0.44–1.00)
GFR, Estimated: 26 mL/min — ABNORMAL LOW (ref >60)
Glucose, Bld: 178 mg/dL — ABNORMAL HIGH (ref 70–99)
Potassium: 3.6 mmol/L (ref 3.5–5.1)
Sodium: 138 mmol/L (ref 135–145)
Total Bilirubin: 0.4 mg/dL (ref 0.3–1.2)
Total Protein: 6.1 g/dL — ABNORMAL LOW (ref 6.5–8.1)

## 2020-08-14 LAB — CBC WITH DIFFERENTIAL (CANCER CENTER ONLY)
Abs Immature Granulocytes: 0.1 10*3/uL — ABNORMAL HIGH (ref 0.00–0.07)
Basophils Absolute: 0 10*3/uL (ref 0.0–0.1)
Basophils Relative: 1 %
Eosinophils Absolute: 0.2 10*3/uL (ref 0.0–0.5)
Eosinophils Relative: 3 %
HCT: 26.9 % — ABNORMAL LOW (ref 36.0–46.0)
Hemoglobin: 8.9 g/dL — ABNORMAL LOW (ref 12.0–15.0)
Immature Granulocytes: 2 %
Lymphocytes Relative: 19 %
Lymphs Abs: 1.2 10*3/uL (ref 0.7–4.0)
MCH: 32.6 pg (ref 26.0–34.0)
MCHC: 33.1 g/dL (ref 30.0–36.0)
MCV: 98.5 fL (ref 80.0–100.0)
Monocytes Absolute: 1.2 10*3/uL — ABNORMAL HIGH (ref 0.1–1.0)
Monocytes Relative: 19 %
Neutro Abs: 3.6 10*3/uL (ref 1.7–7.7)
Neutrophils Relative %: 56 %
Platelet Count: 190 10*3/uL (ref 150–400)
RBC: 2.73 MIL/uL — ABNORMAL LOW (ref 3.87–5.11)
RDW: 18.6 % — ABNORMAL HIGH (ref 11.5–15.5)
WBC Count: 6.4 10*3/uL (ref 4.0–10.5)
nRBC: 1.1 % — ABNORMAL HIGH (ref 0.0–0.2)

## 2020-08-14 LAB — MAGNESIUM: Magnesium: 2 mg/dL (ref 1.7–2.4)

## 2020-08-14 MED ORDER — SODIUM CHLORIDE 0.9% FLUSH
10.0000 mL | INTRAVENOUS | Status: DC | PRN
  Administered 2020-08-14: 10 mL
  Filled 2020-08-14: qty 10

## 2020-08-14 MED ORDER — PROCHLORPERAZINE MALEATE 10 MG PO TABS
10.0000 mg | ORAL_TABLET | Freq: Four times a day (QID) | ORAL | Status: DC | PRN
  Administered 2020-08-14: 10 mg via ORAL

## 2020-08-14 MED ORDER — SODIUM CHLORIDE 0.9 % IV SOLN
Freq: Once | INTRAVENOUS | Status: AC
  Filled 2020-08-14: qty 250

## 2020-08-14 MED ORDER — SODIUM CHLORIDE 0.9% FLUSH
10.0000 mL | Freq: Once | INTRAVENOUS | Status: AC
  Administered 2020-08-14: 10 mL
  Filled 2020-08-14: qty 10

## 2020-08-14 MED ORDER — SODIUM CHLORIDE 0.9 % IV SOLN
800.0000 mg/m2 | Freq: Once | INTRAVENOUS | Status: AC
  Administered 2020-08-14: 1824 mg via INTRAVENOUS
  Filled 2020-08-14: qty 47.97

## 2020-08-14 MED ORDER — HEPARIN SOD (PORK) LOCK FLUSH 100 UNIT/ML IV SOLN
500.0000 [IU] | Freq: Once | INTRAVENOUS | Status: AC | PRN
Start: 2020-08-14 — End: 2020-08-14
  Administered 2020-08-14: 500 [IU]
  Filled 2020-08-14: qty 5

## 2020-08-14 MED ORDER — PROCHLORPERAZINE MALEATE 10 MG PO TABS
ORAL_TABLET | ORAL | Status: AC
  Filled 2020-08-14: qty 1

## 2020-08-14 NOTE — Progress Notes (Signed)
Hematology and Oncology Follow Up Visit  Maria Marquez [id number] Aug 11, 1946 74 y.o. 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  She presented with T2N0 localized disease and no evidence of regional metastasis.    Secondary diagnosis: 1.3 x 1.1 cm right suprahilar spiculated mass of the lung.  Prior Therapy:   She is status post cystoscopy and urethroscopy and a biopsy of her renal pelvic mass completed by Dr. Lovena Marquez in April 18, 2020.  This showed high-grade urothelial carcinoma.  She is status post bronchoscopy and a biopsy of her right upper lung mass which was not diagnostic.  Current therapy: Gemcitabine and cisplatin chemotherapy started on June 13, 2020.  She is here for day 1 cycle 4 of therapy.  Interim History: Maria Marquez returns today for a follow-up visit.  Since her last visit, she reports no major changes in her health.  She is tolerating chemotherapy with the persistent nausea but no vomiting.  She is eating well and has gained some weight.  She denies any excessive fatigue tiredness but does report occasional decrease in her exercise tolerance.  Performance status quality of life remains unchanged.  She denies any hematuria dysuria.  She denies any cough or wheezing.     Medications: Unchanged on review. Current Outpatient Medications  Medication Sig Dispense Refill  . acetaminophen (TYLENOL) 500 MG tablet Take 1,000 mg by mouth every 8 (eight) hours as needed for moderate pain.     [name] albuterol (VENTOLIN HFA) 108 (90 Base) MCG/ACT inhaler Inhale 1-2 puffs into the lungs every 6 (six) hours as needed for wheezing or shortness of breath. 18 g 0  . dexamethasone (DECADRON) 4 MG tablet Take 2 tablets (8 mg total) by mouth 2 (two) times daily. Take day 2 and 3 after every chemotherapy dose, then only as needed 24 tablet 1  .  EPINEPHrine (EPIPEN 2-PAK) 0.3 mg/0.3 mL IJ SOAJ injection Inject 0.3 mg into the muscle as needed for anaphylaxis.  (Patient not taking: Reported on 06/27/2020)    . estradiol-norethindrone (ACTIVELLA) 1-0.5 MG tablet Take 1 tablet by mouth daily. Please schedule follow up with Dr. Jonni Sanger for further refills [phone] 28 tablet 0  . ezetimibe (ZETIA) 10 MG tablet TAKE 1 TABLET EVERY DAY (Patient taking differently: Take 10 mg by mouth daily. ) 90 tablet 3  . levofloxacin (LEVAQUIN) 250 MG tablet Take 1 tablet (250 mg total) by mouth daily. 6 tablet 0  . levothyroxine (SYNTHROID) 137 MCG tablet TAKE 1 TABLET (137 MCG TOTAL) BY MOUTH AT BEDTIME. 90 tablet 3  . lidocaine-prilocaine (EMLA) cream Apply 1 application topically as needed. 30 g 0  . LORazepam (ATIVAN) 0.5 MG tablet Take 1 tablet (0.5 mg total) by mouth at bedtime as needed for anxiety. 20 tablet 0  . losartan-hydrochlorothiazide (HYZAAR) 50-12.5 MG tablet TAKE 1 TABLET EVERY DAY (Patient taking differently: Take 1 tablet by mouth daily. ) 90 tablet 3  . ondansetron (ZOFRAN) 8 MG tablet Take 1 tablet (8 mg total) by mouth every 8 (eight) hours as needed for nausea or vomiting. 63 tablet 0  . prochlorperazine (COMPAZINE) 10 MG tablet Take 4 times a day starting the day after chemotherapy, repeat the day after that, then take 4 times a day as needed for nausea 30 tablet 0  . senna-docusate (SENNA S) 8.6-50 MG tablet Take 1 tablet by mouth at bedtime. 90 tablet 3  .  simvastatin (ZOCOR) 10 MG tablet TAKE 1 TABLET (10 MG TOTAL) BY MOUTH 2 (TWO) TIMES A WEEK. (Patient taking differently: Take 10 mg by mouth 2 (two) times a week. Saturday and Sunday) 26 tablet 3  . TURMERIC CURCUMIN PO Take 2,250 mg by mouth daily. (Patient not taking: Reported on 06/05/2020)    . XIIDRA 5 % SOLN Place 1 drop into both eyes daily as needed (dry eyes).      No current facility-administered medications for this visit.     Allergies:  Allergies  Allergen  Reactions  . Iodine Shortness Of Breath  . Keflex [Cephalexin] Shortness Of Breath, Rash and Tinitus  . Lasix [Furosemide] Itching  . Rosuvastatin Other (See Comments)    Muscle Pain in Thighs  . Shellfish Allergy Hives  . Sulfa Antibiotics Nausea Only  . Latex Rash  . Lisinopril Cough  . Tramadol Itching      Physical Exam: Blood pressure (!) 143/54, pulse 82, temperature 97.6 F (36.4 C), temperature source Tympanic, resp. rate 18, height 5\' 6"  (1.676 m), weight 256 lb 8 oz (116.3 kg), SpO2 100 %.    ECOG: 0   General appearance: Comfortable appearing without any discomfort Head: Normocephalic without any trauma Oropharynx: Mucous membranes are moist and pink without any thrush or ulcers. Eyes: Pupils are equal and round reactive to light. Lymph nodes: No cervical, supraclavicular, inguinal or axillary lymphadenopathy.   Heart:regular rate and rhythm.  S1 and S2 without leg edema. Lung: Clear without any rhonchi or wheezes.  No dullness to percussion. Abdomin: Soft, nontender, nondistended with good bowel sounds.  No hepatosplenomegaly. Musculoskeletal: No joint deformity or effusion.  Full range of motion noted. Neurological: No deficits noted on motor, sensory and deep tendon reflex exam. Skin: No petechial rash or dryness.  Appeared moist.        Lab Results: Lab Results  Component Value Date   WBC 4.6 08/01/2020   HGB 10.6 (L) 08/01/2020   HCT 31.4 (L) 08/01/2020   MCV 94.0 08/01/2020   PLT 382 08/01/2020     Chemistry      Component Value Date/Time   NA 141 08/01/2020 1246   NA 140 12/19/2019 1132   K 4.4 08/01/2020 1246   CL 106 08/01/2020 1246   CO2 24 08/01/2020 1246   BUN 23 08/01/2020 1246   BUN 25 12/19/2019 1132   CREATININE 1.86 (H) 08/01/2020 1246   GLU 92 02/04/2016 0000      Component Value Date/Time   CALCIUM 8.9 08/01/2020 1246   ALKPHOS 67 08/01/2020 1246   AST 19 08/01/2020 1246   ALT 18 08/01/2020 1246   BILITOT 0.4 08/01/2020  1246        Impression and Plan:  74 year old woman with  1.   Right renal pelvis high-grade urothelial carcinoma diagnosed in August 2021.      She continues to tolerate neoadjuvant chemotherapy without any major complications.  She continues to have issues with nausea utilizing around-the-clock Zofran.  Risks and benefits of proceeding with cycle 4 chemotherapy were reviewed today.  Potential complication occluding worsening renal insufficiency, worsening nausea, vomiting and peripheral neuropathy were reviewed.  Plan is to repeat imaging studies after the completion of cycle.  She is agreeable to proceed at this time.  She will proceed with cycle 4 without cisplatin given her kidney function.    2. IV access: Port-A-Cath currently in place without any issues.  3. Nausea: Manageable with Zofran.   4. Renal function surveillance:Last  creatinine it was up slightly with a creatinine clearance now less than 30 cc/min.  Cisplatin will be held at this time.   5. Goals of care: Curative options still exist at this time.  Aggressive measures are currently warranted.  6.  Pulmonary nodule: The plan is to repeat imaging studies upon completing chemotherapy  7. Follow-up: In 1 week get complete cycle 4 of therapy.  She will return in 3 to 4 weeks for repeat imaging studies.  30  minutes were dedicated to this encounter.  Time was spent on reviewing disease status, reviewing laboratory data and preparation of his cancer and cancer therapy.   Zola Button, MD 12/23/20218:11 AM

## 2020-08-14 NOTE — Progress Notes (Signed)
Ok to treat with Gemzar with creatinine today per MD [facility]

## 2020-08-14 NOTE — Patient Instructions (Signed)
[facility] °Discharge Instructions for Patients Receiving Chemotherapy ° °Today you received the following chemotherapy agents Gemzar ° °To help prevent nausea and vomiting after your treatment, we encourage you to take your nausea medication as directed. °  °If you develop nausea and vomiting that is not controlled by your nausea medication, call the clinic.  ° °BELOW ARE SYMPTOMS THAT SHOULD BE REPORTED IMMEDIATELY: °· *FEVER GREATER THAN 100.5 F °· *CHILLS WITH OR WITHOUT FEVER °· NAUSEA AND VOMITING THAT IS NOT CONTROLLED WITH YOUR NAUSEA MEDICATION °· *UNUSUAL SHORTNESS OF BREATH °· *UNUSUAL BRUISING OR BLEEDING °· TENDERNESS IN MOUTH AND THROAT WITH OR WITHOUT PRESENCE OF ULCERS °· *URINARY PROBLEMS °· *BOWEL PROBLEMS °· UNUSUAL RASH °Items with * indicate a potential emergency and should be followed up as soon as possible. ° °Feel free to call the clinic should you have any questions or concerns. The clinic phone number is [phone]. ° °Please show the CHEMO ALERT CARD at check-in to the Emergency Department and triage nurse. ° ° °

## 2020-08-20 ENCOUNTER — Telehealth: Payer: Self-pay | Admitting: Oncology

## 2020-08-20 NOTE — Telephone Encounter (Signed)
Scheduled per 12/23 los, patient has been called and notified.

## 2020-08-21 ENCOUNTER — Inpatient Hospital Stay: Payer: Medicare HMO

## 2020-08-21 ENCOUNTER — Other Ambulatory Visit: Payer: Self-pay

## 2020-08-21 VITALS — BP 147/81 | HR 76 | Temp 97.7°F | Resp 16

## 2020-08-21 DIAGNOSIS — Z5111 Encounter for antineoplastic chemotherapy: Secondary | ICD-10-CM | POA: Diagnosis not present

## 2020-08-21 DIAGNOSIS — Z95828 Presence of other vascular implants and grafts: Secondary | ICD-10-CM

## 2020-08-21 DIAGNOSIS — C659 Malignant neoplasm of unspecified renal pelvis: Secondary | ICD-10-CM

## 2020-08-21 DIAGNOSIS — C651 Malignant neoplasm of right renal pelvis: Secondary | ICD-10-CM | POA: Diagnosis not present

## 2020-08-21 DIAGNOSIS — R918 Other nonspecific abnormal finding of lung field: Secondary | ICD-10-CM | POA: Diagnosis not present

## 2020-08-21 LAB — CBC WITH DIFFERENTIAL (CANCER CENTER ONLY)
Abs Immature Granulocytes: 0.33 10*3/uL — ABNORMAL HIGH (ref 0.00–0.07)
Basophils Absolute: 0 10*3/uL (ref 0.0–0.1)
Basophils Relative: 1 %
Eosinophils Absolute: 0 10*3/uL (ref 0.0–0.5)
Eosinophils Relative: 1 %
HCT: 26 % — ABNORMAL LOW (ref 36.0–46.0)
Hemoglobin: 8.7 g/dL — ABNORMAL LOW (ref 12.0–15.0)
Immature Granulocytes: 9 %
Lymphocytes Relative: 30 %
Lymphs Abs: 1.2 10*3/uL (ref 0.7–4.0)
MCH: 33.1 pg (ref 26.0–34.0)
MCHC: 33.5 g/dL (ref 30.0–36.0)
MCV: 98.9 fL (ref 80.0–100.0)
Monocytes Absolute: 1 10*3/uL (ref 0.1–1.0)
Monocytes Relative: 25 %
Neutro Abs: 1.3 10*3/uL — ABNORMAL LOW (ref 1.7–7.7)
Neutrophils Relative %: 34 %
Platelet Count: 271 10*3/uL (ref 150–400)
RBC: 2.63 MIL/uL — ABNORMAL LOW (ref 3.87–5.11)
RDW: 18.9 % — ABNORMAL HIGH (ref 11.5–15.5)
WBC Count: 3.9 10*3/uL — ABNORMAL LOW (ref 4.0–10.5)
nRBC: 7 % — ABNORMAL HIGH (ref 0.0–0.2)

## 2020-08-21 LAB — CMP (CANCER CENTER ONLY)
ALT: 19 U/L (ref 0–44)
AST: 23 U/L (ref 15–41)
Albumin: 3.4 g/dL — ABNORMAL LOW (ref 3.5–5.0)
Alkaline Phosphatase: 56 U/L (ref 38–126)
Anion gap: 4 — ABNORMAL LOW (ref 5–15)
BUN: 23 mg/dL (ref 8–23)
CO2: 29 mmol/L (ref 22–32)
Calcium: 9.5 mg/dL (ref 8.9–10.3)
Chloride: 103 mmol/L (ref 98–111)
Creatinine: 1.79 mg/dL — ABNORMAL HIGH (ref 0.44–1.00)
GFR, Estimated: 30 mL/min — ABNORMAL LOW (ref >60)
Glucose, Bld: 93 mg/dL (ref 70–99)
Potassium: 4.3 mmol/L (ref 3.5–5.1)
Sodium: 136 mmol/L (ref 135–145)
Total Bilirubin: 0.6 mg/dL (ref 0.3–1.2)
Total Protein: 6.3 g/dL — ABNORMAL LOW (ref 6.5–8.1)

## 2020-08-21 MED ORDER — SODIUM CHLORIDE 0.9% FLUSH
10.0000 mL | Freq: Once | INTRAVENOUS | Status: AC
  Administered 2020-08-21: 10 mL
  Filled 2020-08-21: qty 10

## 2020-08-21 MED ORDER — PROCHLORPERAZINE MALEATE 10 MG PO TABS
ORAL_TABLET | ORAL | Status: AC
  Filled 2020-08-21: qty 1

## 2020-08-21 MED ORDER — HEPARIN SOD (PORK) LOCK FLUSH 100 UNIT/ML IV SOLN
500.0000 [IU] | Freq: Once | INTRAVENOUS | Status: AC | PRN
  Administered 2020-08-21: 500 [IU]
  Filled 2020-08-21: qty 5

## 2020-08-21 MED ORDER — PROCHLORPERAZINE MALEATE 10 MG PO TABS
10.0000 mg | ORAL_TABLET | Freq: Once | ORAL | Status: AC
  Administered 2020-08-21: 10 mg via ORAL

## 2020-08-21 MED ORDER — SODIUM CHLORIDE 0.9 % IV SOLN
800.0000 mg/m2 | Freq: Once | INTRAVENOUS | Status: AC
  Administered 2020-08-21: 1824 mg via INTRAVENOUS
  Filled 2020-08-21: qty 47.97

## 2020-08-21 MED ORDER — SODIUM CHLORIDE 0.9 % IV SOLN
Freq: Once | INTRAVENOUS | Status: AC
  Filled 2020-08-21: qty 250

## 2020-08-21 MED ORDER — SODIUM CHLORIDE 0.9% FLUSH
10.0000 mL | INTRAVENOUS | Status: DC | PRN
Start: 2020-08-21 — End: 2020-08-21
  Administered 2020-08-21: 10 mL
  Filled 2020-08-21: qty 10

## 2020-08-21 NOTE — Progress Notes (Signed)
Per Dr. Alen Blew, ok to treat with ANC 1.3 and creatinine 1.79.

## 2020-08-21 NOTE — Patient Instructions (Signed)

## 2020-08-23 DIAGNOSIS — Z923 Personal history of irradiation: Secondary | ICD-10-CM

## 2020-08-23 DIAGNOSIS — Z9221 Personal history of antineoplastic chemotherapy: Secondary | ICD-10-CM

## 2020-08-23 HISTORY — DX: Personal history of irradiation: Z92.3

## 2020-08-23 HISTORY — DX: Personal history of antineoplastic chemotherapy: Z92.21

## 2020-08-27 ENCOUNTER — Telehealth: Payer: Self-pay | Admitting: Oncology

## 2020-08-27 NOTE — Telephone Encounter (Signed)
Called pt per 1/4 schmsg - no answer . Left message for patient to call back if reschedule was still needed

## 2020-09-02 ENCOUNTER — Encounter: Admitting: Internal Medicine

## 2020-09-09 ENCOUNTER — Ambulatory Visit (HOSPITAL_COMMUNITY)

## 2020-09-09 ENCOUNTER — Other Ambulatory Visit

## 2020-09-09 ENCOUNTER — Inpatient Hospital Stay: Payer: Medicare HMO

## 2020-09-11 ENCOUNTER — Ambulatory Visit: Admitting: Oncology

## 2020-09-12 ENCOUNTER — Ambulatory Visit: Admitting: Oncology

## 2020-09-19 ENCOUNTER — Other Ambulatory Visit: Payer: Self-pay

## 2020-09-19 ENCOUNTER — Ambulatory Visit (HOSPITAL_COMMUNITY)
Admission: RE | Admit: 2020-09-19 | Discharge: 2020-09-19 | Disposition: A | Discharge status: Home / Self Care | Payer: Medicare HMO | Source: Ambulatory Visit | Attending: Oncology | Admitting: Oncology

## 2020-09-19 ENCOUNTER — Inpatient Hospital Stay: Payer: Medicare HMO | Attending: Oncology

## 2020-09-19 ENCOUNTER — Inpatient Hospital Stay: Payer: Medicare HMO

## 2020-09-19 DIAGNOSIS — Z452 Encounter for adjustment and management of vascular access device: Secondary | ICD-10-CM | POA: Insufficient documentation

## 2020-09-19 DIAGNOSIS — C659 Malignant neoplasm of unspecified renal pelvis: Secondary | ICD-10-CM

## 2020-09-19 DIAGNOSIS — C651 Malignant neoplasm of right renal pelvis: Secondary | ICD-10-CM | POA: Diagnosis not present

## 2020-09-19 DIAGNOSIS — R911 Solitary pulmonary nodule: Secondary | ICD-10-CM | POA: Diagnosis not present

## 2020-09-19 DIAGNOSIS — Z95828 Presence of other vascular implants and grafts: Secondary | ICD-10-CM

## 2020-09-19 LAB — CMP (CANCER CENTER ONLY)
ALT: 15 U/L (ref 0–44)
AST: 16 U/L (ref 15–41)
Albumin: 3.5 g/dL (ref 3.5–5.0)
Alkaline Phosphatase: 57 U/L (ref 38–126)
Anion gap: 8 (ref 5–15)
BUN: 30 mg/dL — ABNORMAL HIGH (ref 8–23)
CO2: 25 mmol/L (ref 22–32)
Calcium: 9.1 mg/dL (ref 8.9–10.3)
Chloride: 106 mmol/L (ref 98–111)
Creatinine: 1.42 mg/dL — ABNORMAL HIGH (ref 0.44–1.00)
GFR, Estimated: 39 mL/min — ABNORMAL LOW
Glucose, Bld: 121 mg/dL — ABNORMAL HIGH (ref 70–99)
Potassium: 3.9 mmol/L (ref 3.5–5.1)
Sodium: 139 mmol/L (ref 135–145)
Total Bilirubin: 0.8 mg/dL (ref 0.3–1.2)
Total Protein: 6.2 g/dL — ABNORMAL LOW (ref 6.5–8.1)

## 2020-09-19 LAB — CBC WITH DIFFERENTIAL (CANCER CENTER ONLY)
Abs Immature Granulocytes: 0.04 10*3/uL (ref 0.00–0.07)
Basophils Absolute: 0.1 10*3/uL (ref 0.0–0.1)
Basophils Relative: 1 %
Eosinophils Absolute: 0.3 10*3/uL (ref 0.0–0.5)
Eosinophils Relative: 5 %
HCT: 31.1 % — ABNORMAL LOW (ref 36.0–46.0)
Hemoglobin: 10 g/dL — ABNORMAL LOW (ref 12.0–15.0)
Immature Granulocytes: 1 %
Lymphocytes Relative: 20 %
Lymphs Abs: 1.5 10*3/uL (ref 0.7–4.0)
MCH: 33.6 pg (ref 26.0–34.0)
MCHC: 32.2 g/dL (ref 30.0–36.0)
MCV: 104.4 fL — ABNORMAL HIGH (ref 80.0–100.0)
Monocytes Absolute: 1 10*3/uL (ref 0.1–1.0)
Monocytes Relative: 13 %
Neutro Abs: 4.5 10*3/uL (ref 1.7–7.7)
Neutrophils Relative %: 60 %
Platelet Count: 210 10*3/uL (ref 150–400)
RBC: 2.98 MIL/uL — ABNORMAL LOW (ref 3.87–5.11)
RDW: 18.3 % — ABNORMAL HIGH (ref 11.5–15.5)
WBC Count: 7.4 10*3/uL (ref 4.0–10.5)
nRBC: 0 % (ref 0.0–0.2)

## 2020-09-19 LAB — GLUCOSE, CAPILLARY: Glucose-Capillary: 120 mg/dL — ABNORMAL HIGH (ref 70–99)

## 2020-09-19 MED ORDER — FLUDEOXYGLUCOSE F - 18 (FDG) INJECTION
12.7600 | Freq: Once | INTRAVENOUS | Status: AC | PRN
  Administered 2020-09-19: 12.76 via INTRAVENOUS

## 2020-09-19 MED ORDER — SODIUM CHLORIDE 0.9% FLUSH
10.0000 mL | Freq: Once | INTRAVENOUS | Status: AC
  Administered 2020-09-19: 10 mL
  Filled 2020-09-19: qty 10

## 2020-09-19 NOTE — Patient Instructions (Signed)

## 2020-09-22 ENCOUNTER — Telehealth: Payer: Self-pay

## 2020-09-22 NOTE — Telephone Encounter (Signed)
Pt left a message wanting to know if it is ok to have her teeth cleaned.  I have called the pt back to acknowledge the call and advised the request has been sent to Dr. Alen Blew for response. She expressed understanding of this information.

## 2020-09-23 DIAGNOSIS — C3411 Malignant neoplasm of upper lobe, right bronchus or lung: Secondary | ICD-10-CM

## 2020-09-23 HISTORY — DX: Malignant neoplasm of upper lobe, right bronchus or lung: C34.11

## 2020-09-23 NOTE — Telephone Encounter (Signed)
Ok to have teeth cleaning

## 2020-09-26 ENCOUNTER — Other Ambulatory Visit: Payer: Self-pay

## 2020-09-26 ENCOUNTER — Inpatient Hospital Stay: Payer: Medicare HMO | Attending: Oncology | Admitting: Oncology

## 2020-09-26 VITALS — BP 164/55 | HR 78 | Temp 97.4°F | Resp 19 | Ht 66.0 in | Wt 248.2 lb

## 2020-09-26 DIAGNOSIS — R911 Solitary pulmonary nodule: Secondary | ICD-10-CM | POA: Diagnosis not present

## 2020-09-26 DIAGNOSIS — C659 Malignant neoplasm of unspecified renal pelvis: Secondary | ICD-10-CM

## 2020-09-26 DIAGNOSIS — C651 Malignant neoplasm of right renal pelvis: Secondary | ICD-10-CM | POA: Insufficient documentation

## 2020-09-26 DIAGNOSIS — R918 Other nonspecific abnormal finding of lung field: Secondary | ICD-10-CM | POA: Diagnosis not present

## 2020-09-26 NOTE — Progress Notes (Signed)
Hematology and Oncology Follow Up Visit  Maria Marquez [id number] August 01, 1946 75 y.o. 09/26/2020 3:36 PM Maria Marquez, Maria Fetch, MD   Principle Diagnosis: 75 year old woman with T2N0 high-grade urothelial carcinoma arising from the renal pelvis diagnosed in August 2021.     Secondary diagnosis: Right lung mass measuring 1.3 x 1.1 cm in the suprahilar region diagnosed in August 2021.  Prior Therapy:   She is status post cystoscopy and urethroscopy and a biopsy of her renal pelvic mass completed by Dr. Lovena Neighbours in April 18, 2020.  This showed high-grade urothelial carcinoma.  She is status post bronchoscopy and a biopsy of her right upper lung mass which was not diagnostic.  Gemcitabine and cisplatin chemotherapy started on June 13, 2020.  She completed 3 cycles and cycle 4 was given without cisplatin completed in December 2021.  Current therapy: Reevaluation for further therapy.  Interim History: Maria Marquez is here for repeat evaluation.  Since the last visit, she reports slow recovery from her cancer treatment at this time.  She does report some residual nausea but no vomiting.  She denies any abdominal pain or discomfort.  She denies any hematochezia or melena.     Medications: Updated without acute changes. Current Outpatient Medications  Medication Sig Dispense Refill  . acetaminophen (TYLENOL) 500 MG tablet Take 1,000 mg by mouth every 8 (eight) hours as needed for moderate pain.     [name] albuterol (VENTOLIN HFA) 108 (90 Base) MCG/ACT inhaler Inhale 1-2 puffs into the lungs every 6 (six) hours as needed for wheezing or shortness of breath. 18 g 0  . dexamethasone (DECADRON) 4 MG tablet Take 2 tablets (8 mg total) by mouth 2 (two) times daily. Take day 2 and 3 after every chemotherapy dose, then only as needed 24 tablet 1  . EPINEPHrine (EPIPEN 2-PAK) 0.3 mg/0.3 mL IJ SOAJ injection Inject 0.3 mg into the muscle as needed for anaphylaxis.  (Patient not taking: Reported on  06/27/2020)    . estradiol-norethindrone (ACTIVELLA) 1-0.5 MG tablet Take 1 tablet by mouth daily. Please schedule follow up with Dr. Jonni Sanger for further refills [phone] 28 tablet 0  . ezetimibe (ZETIA) 10 MG tablet TAKE 1 TABLET EVERY DAY (Patient taking differently: Take 10 mg by mouth daily. ) 90 tablet 3  . levofloxacin (LEVAQUIN) 250 MG tablet Take 1 tablet (250 mg total) by mouth daily. 6 tablet 0  . levothyroxine (SYNTHROID) 137 MCG tablet TAKE 1 TABLET (137 MCG TOTAL) BY MOUTH AT BEDTIME. 90 tablet 3  . lidocaine-prilocaine (EMLA) cream Apply 1 application topically as needed. 30 g 0  . LORazepam (ATIVAN) 0.5 MG tablet Take 1 tablet (0.5 mg total) by mouth at bedtime as needed for anxiety. 20 tablet 0  . losartan-hydrochlorothiazide (HYZAAR) 50-12.5 MG tablet TAKE 1 TABLET EVERY DAY (Patient taking differently: Take 1 tablet by mouth daily. ) 90 tablet 3  . ondansetron (ZOFRAN) 8 MG tablet Take 1 tablet (8 mg total) by mouth every 8 (eight) hours as needed for nausea or vomiting. 63 tablet 0  . prochlorperazine (COMPAZINE) 10 MG tablet Take 4 times a day starting the day after chemotherapy, repeat the day after that, then take 4 times a day as needed for nausea 30 tablet 0  . senna-docusate (SENNA S) 8.6-50 MG tablet Take 1 tablet by mouth at bedtime. 90 tablet 3  . simvastatin (ZOCOR) 10 MG tablet TAKE 1 TABLET (10 MG TOTAL) BY MOUTH 2 (TWO) TIMES A WEEK. (Patient taking differently: Take 10  mg by mouth 2 (two) times a week. Saturday and Sunday) 26 tablet 3  . TURMERIC CURCUMIN PO Take 2,250 mg by mouth daily. (Patient not taking: Reported on 06/05/2020)    . XIIDRA 5 % SOLN Place 1 drop into both eyes daily as needed (dry eyes).      No current facility-administered medications for this visit.     Allergies:  Allergies  Allergen Reactions  . Iodine Shortness Of Breath  . Keflex [Cephalexin] Shortness Of Breath, Rash and Tinitus  . Lasix [Furosemide] Itching  . Rosuvastatin Other  (See Comments)    Muscle Pain in Thighs  . Shellfish Allergy Hives  . Sulfa Antibiotics Nausea Only  . Latex Rash  . Lisinopril Cough  . Tramadol Itching      Physical Exam: Blood pressure (!) 164/55, pulse 78, temperature (!) 97.4 F (36.3 C), temperature source Tympanic, resp. rate 19, height 5\' 6"  (1.676 m), weight 248 lb 3.2 oz (112.6 kg), SpO2 100 %.     ECOG: 0    General appearance: Alert, awake without any distress. Head: Atraumatic without abnormalities Oropharynx: Without any thrush or ulcers. Eyes: No scleral icterus. Lymph nodes: No lymphadenopathy noted in the cervical, supraclavicular, or axillary nodes Heart:regular rate and rhythm, without any murmurs or gallops.   Lung: Clear to auscultation without any rhonchi, wheezes or dullness to percussion. Abdomin: Soft, nontender without any shifting dullness or ascites. Musculoskeletal: No clubbing or cyanosis. Neurological: No motor or sensory deficits. Skin: No rashes or lesions.        Lab Results: Lab Results  Component Value Date   WBC 7.4 09/19/2020   HGB 10.0 (L) 09/19/2020   HCT 31.1 (L) 09/19/2020   MCV 104.4 (H) 09/19/2020   PLT 210 09/19/2020     Chemistry      Component Value Date/Time   NA 139 09/19/2020 0805   NA 140 12/19/2019 1132   K 3.9 09/19/2020 0805   CL 106 09/19/2020 0805   CO2 25 09/19/2020 0805   BUN 30 (H) 09/19/2020 0805   BUN 25 12/19/2019 1132   CREATININE 1.42 (H) 09/19/2020 0805   GLU 92 02/04/2016 0000      Component Value Date/Time   CALCIUM 9.1 09/19/2020 0805   ALKPHOS 57 09/19/2020 0805   AST 16 09/19/2020 0805   ALT 15 09/19/2020 0805   BILITOT 0.8 09/19/2020 0805      IMPRESSION: 1. Interval decrease in volume and metabolic activity of the RIGHT upper lobe pulmonary nodule. 2. No residual hypermetabolic mediastinal lymph nodes. 3. No evidence of malignancy in the abdomen pelvis.   Impression and Plan:  75 year old woman with  1.    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      She is status post neoadjuvant chemotherapy completed without cisplatin on August 21, 2020 after receiving 3 cycles with cisplatin based therapy.  PET CT scan completed on September 19, 2020 was personally reviewed and showed no evidence of metastatic disease or lymphadenopathy.    Treatment options were discussed at this time which include surgical resection which will require nephrectomy which could be problematic given her baseline kidney function, continued observation for systemic maintenance immunotherapy could be implemented in the future.  I recommend a period of observation and repeat scope and visualization of this area before determination of next course of action.  She will follow-up with Dr. Lovena Neighbours in the near future for repeat cystoscopy.    2. IV  access: Port-A-Cath remains in place without any issues and will continue to be flushed adequately.   3.  Pulmonary nodule: The differential diagnosis includes metastatic GU malignancy versus primary lung neoplasm.  I am in favor of surgical resection not only for therapeutic but also from a diagnostic standpoint which can help guide her GU cancer treatment.I would also favor definitive intervention for her pulmonary nodule before subjecting her to any further surgery for her GU cancer.  I recommended thoracic surgery evaluation.  She is agreeable with this plan.  4. Renal function surveillance:Her creatinine clearance is improved currently around 40 cc/min since discontinuation of chemotherapy.   5. Goals of care: Her disease remains incurable at this time and aggressive measures are warranted.  6. Follow-up: In 6 to 8 weeks for repeat evaluation.  30  minutes were spent on this visit.  The time was dedicated to updating her disease status, reviewing imaging studies and treatment options for the future.   Zola Button, MD 2/4/20223:36 PM

## 2020-10-03 ENCOUNTER — Ambulatory Visit (INDEPENDENT_AMBULATORY_CARE_PROVIDER_SITE_OTHER): Payer: Medicare HMO | Admitting: Internal Medicine

## 2020-10-03 ENCOUNTER — Encounter: Payer: Self-pay | Admitting: Internal Medicine

## 2020-10-03 ENCOUNTER — Telehealth: Payer: Self-pay | Admitting: Internal Medicine

## 2020-10-03 ENCOUNTER — Other Ambulatory Visit: Payer: Self-pay

## 2020-10-03 VITALS — BP 130/80 | HR 75 | Temp 97.9°F | Wt 245.4 lb

## 2020-10-03 DIAGNOSIS — N1831 Chronic kidney disease, stage 3a: Secondary | ICD-10-CM

## 2020-10-03 DIAGNOSIS — C659 Malignant neoplasm of unspecified renal pelvis: Secondary | ICD-10-CM | POA: Diagnosis not present

## 2020-10-03 DIAGNOSIS — E782 Mixed hyperlipidemia: Secondary | ICD-10-CM

## 2020-10-03 DIAGNOSIS — I10 Essential (primary) hypertension: Secondary | ICD-10-CM

## 2020-10-03 DIAGNOSIS — E890 Postprocedural hypothyroidism: Secondary | ICD-10-CM | POA: Diagnosis not present

## 2020-10-03 DIAGNOSIS — R911 Solitary pulmonary nodule: Secondary | ICD-10-CM

## 2020-10-03 DIAGNOSIS — Z79890 Hormone replacement therapy (postmenopausal): Secondary | ICD-10-CM | POA: Diagnosis not present

## 2020-10-03 LAB — TSH: TSH: 0.25 u[IU]/mL — ABNORMAL LOW (ref 0.35–4.50)

## 2020-10-03 MED ORDER — EPINEPHRINE 0.3 MG/0.3ML IJ SOAJ
0.3000 mg | INTRAMUSCULAR | 2 refills | Status: DC | PRN
Start: 2020-10-03 — End: 2021-11-16

## 2020-10-03 MED ORDER — ESTRADIOL-NORETHINDRONE ACET 1-0.5 MG PO TABS: 1.0000 | ORAL_TABLET | Freq: Every day | ORAL | 0 refills | Status: DC

## 2020-10-03 NOTE — Progress Notes (Signed)
New Patient Office Visit     This visit occurred during the SARS-CoV-2 public health emergency.  Safety protocols were in place, including screening questions prior to the visit, additional usage of staff PPE, and extensive cleaning of exam room while observing appropriate contact time as indicated for disinfecting solutions.    CC/Reason for Visit: Establish care, discuss chronic medical conditions, medication refills Previous PCP: Billey Chang, MD   HPI: Ryan Palermo is a 75 y.o. female who is coming in today for the above mentioned reasons. Past Medical History is significant for: Hypertension, hyperlipidemia, hypothyroidism, morbid obesity and chronic kidney disease stage IIIa.  She had been in relatively good health up until August 2021 where she was diagnosed with urothelial carcinoma after a bout of persistent hematuria.  During a PET scan she was found to have a lung nodule.  She has an appointment soon with thoracic surgery for definitive diagnosis of the nodule.  She has already received some neoadjuvant chemotherapy under the guidance of Dr. Alen Blew.  She has been maintained on hormone replacement therapy for postmenopausal vasomotor symptoms for over 15 years.  She is requesting refill of it and of an EpiPen today.  She has not had her thyroid levels checked since 2020.   Past Medical/Surgical History: Past Medical History:  Diagnosis Date  . Acute blood loss anemia   . Arthritis   . Back pain   . Bilious vomiting with nausea   . 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 Chronic kidney disease, stage 3a ([place]) 12/31/2019  . Complication of anesthesia   . Constipation   . Dyspnea    On exertion and rest at times  . Gastroesophageal reflux disease without esophagitis 06/17/2015  . GERD (gastroesophageal reflux disease)   . HLD (hyperlipidemia)   . Hypertension   . Hypothyroid   . Joint pain   . Leg edema   . Leukocytosis   . Multiple food allergies   . Myalgia due to  statin 11/23/2018  . Osteoarthritis of hip    Right  . Pneumonia   . PONV (postoperative nausea and vomiting)   . Postmenopausal hormone replacement therapy 06/17/2015  . Pre-diabetes   . Prediabetes   . Sessile colonic polyp 10/04/2017   Colonoscopy 12/2015, Dr. Fuller Plan, repeat q 5 years.    Past Surgical History:  Procedure Laterality Date  . CYSTOSCOPY WITH URETEROSCOPY Right 04/18/2020   Procedure: CYSTOSCOPY WITH URETEROSCOPY, BIOPSY;  Surgeon: Ceasar Mons, MD;  Location: [facility];  Service: Urology;  Laterality: Right;  ONLY NEEDS 45 MIN  . EYE SURGERY  1987  . HERNIA REPAIR    . IR IMAGING GUIDED PORT INSERTION  06/04/2020  . THYROID LOBECTOMY     right side  . TOTAL HIP ARTHROPLASTY Right 01/28/2016   Procedure: RIGHT TOTAL HIP ARTHROPLASTY ANTERIOR APPROACH;  Surgeon: [name], MD;  Location: [facility];  Service: Orthopedics;  Laterality: Right;  [name] VIDEO BRONCHOSCOPY WITH ENDOBRONCHIAL NAVIGATION N/A 06/06/2020   Procedure: VIDEO BRONCHOSCOPY WITH ENDOBRONCHIAL NAVIGATION;  Surgeon: Collene Gobble, MD;  Location: [facility];  Service: Thoracic;  Laterality: N/A;    Social History:  reports that she quit smoking about 5 months ago. Her smoking use included cigarettes. She has a 45.00 pack-year smoking history. She has quit using smokeless tobacco. She reports current alcohol use. She reports that she does not use drugs.  Allergies: Allergies  Allergen Reactions  . Iodine Shortness Of Breath  .  Keflex [Cephalexin] Shortness Of Breath, Rash and Tinitus  . Lasix [Furosemide] Itching  . Rosuvastatin Other (See Comments)    Muscle Pain in Thighs  . Shellfish Allergy Hives  . Sulfa Antibiotics Nausea Only  . Latex Rash  . Lisinopril Cough  . Tramadol Itching    Family History:  Family History  Problem Relation Age of Onset  . Arthritis Mother   . Breast cancer Mother   . Schizophrenia Mother   . Diabetes Father   . Heart disease Father   . Kidney disease Father    . Stroke Sister   . Lung cancer Brother   . Heart disease Brother   . Lung cancer Brother   . Heart disease Brother   . Vision loss Brother        Glaucoma  . Alcohol abuse Brother      Current Outpatient Medications:  .  acetaminophen (TYLENOL) 500 MG tablet, Take 1,000 mg by mouth every 8 (eight) hours as needed for moderate pain. , Disp: , Rfl:  .  albuterol (VENTOLIN HFA) 108 (90 Base) MCG/ACT inhaler, Inhale 1-2 puffs into the lungs every 6 (six) hours as needed for wheezing or shortness of breath., Disp: 18 g, Rfl: 0 .  ezetimibe (ZETIA) 10 MG tablet, TAKE 1 TABLET EVERY DAY (Patient taking differently: Take 10 mg by mouth daily.), Disp: 90 tablet, Rfl: 3 .  levothyroxine (SYNTHROID) 137 MCG tablet, TAKE 1 TABLET (137 MCG TOTAL) BY MOUTH AT BEDTIME., Disp: 90 tablet, Rfl: 3 .  lidocaine-prilocaine (EMLA) cream, Apply 1 application topically as needed., Disp: 30 g, Rfl: 0 .  losartan-hydrochlorothiazide (HYZAAR) 50-12.5 MG tablet, TAKE 1 TABLET EVERY DAY (Patient taking differently: Take 1 tablet by mouth daily.), Disp: 90 tablet, Rfl: 3 .  ondansetron (ZOFRAN) 8 MG tablet, Take 1 tablet (8 mg total) by mouth every 8 (eight) hours as needed for nausea or vomiting., Disp: 63 tablet, Rfl: 0 .  simvastatin (ZOCOR) 10 MG tablet, TAKE 1 TABLET (10 MG TOTAL) BY MOUTH 2 (TWO) TIMES A WEEK. (Patient taking differently: Take 10 mg by mouth 2 (two) times a week. Saturday and Sunday), Disp: 26 tablet, Rfl: 3 .  TURMERIC CURCUMIN PO, Take 2,250 mg by mouth daily., Disp: , Rfl:  .  XIIDRA 5 % SOLN, Place 1 drop into both eyes daily as needed (dry eyes). , Disp: , Rfl:  .  EPINEPHrine 0.3 mg/0.3 mL IJ SOAJ injection, Inject 0.3 mg into the muscle as needed for anaphylaxis., Disp: 1 each, Rfl: 2 .  estradiol-norethindrone (ACTIVELLA) 1-0.5 MG tablet, Take 1 tablet by mouth daily., Disp: 28 tablet, Rfl: 0 .  ondansetron (ZOFRAN) 4 MG tablet, , Disp: , Rfl:  .  oxybutynin (DITROPAN) 5 MG tablet, ,  Disp: , Rfl:  .  SHINGRIX injection, , Disp: , Rfl:   Review of Systems:  Constitutional: Denies fever, chills, diaphoresis, appetite change and fatigue.  HEENT: Denies photophobia, eye pain, redness, hearing loss, ear pain, congestion, sore throat, rhinorrhea, sneezing, mouth sores, trouble swallowing, neck pain, neck stiffness and tinnitus.   Respiratory: Denies SOB, DOE, cough, chest tightness,  and wheezing.   Cardiovascular: Denies chest pain, palpitations and leg swelling.  Gastrointestinal: Denies nausea, vomiting, abdominal pain, diarrhea, constipation, blood in stool and abdominal distention.  Genitourinary: Denies dysuria, urgency, frequency, hematuria, flank pain and difficulty urinating.  Endocrine: Denies: hot or cold intolerance, sweats, changes in hair or nails, polyuria, polydipsia. Musculoskeletal: Denies myalgias, back pain, joint swelling, arthralgias and gait  problem.  Skin: Denies pallor, rash and wound.  Neurological: Denies dizziness, seizures, syncope, weakness, light-headedness, numbness and headaches.  Hematological: Denies adenopathy. Easy bruising, personal or family bleeding history  Psychiatric/Behavioral: Denies suicidal ideation, mood changes, confusion, nervousness, sleep disturbance and agitation    Physical Exam: Vitals:   10/03/20 1306  BP: 130/80  Pulse: 75  Temp: 97.9 F (36.6 C)  TempSrc: Oral  SpO2: 98%  Weight: 245 lb 6.4 oz (111.3 kg)   Body mass index is 39.61 kg/m.  Constitutional: NAD, calm, comfortable, obese Eyes: PERRL, lids and conjunctivae normal ENMT: Mucous membranes are moist.  Respiratory: clear to auscultation bilaterally, no wheezing, no crackles. Normal respiratory effort. No accessory muscle use.  Cardiovascular: Regular rate and rhythm, no murmurs / rubs / gallops. No extremity edema.   Neurologic: Grossly intact and nonfocal Psychiatric: Normal judgment and insight. Alert and oriented x 3. Normal mood.    Impression  and Plan:  Cancer of renal pelvis, unspecified laterality ([place])  Pulmonary nodule -Has consultation with thoracic surgery for definitive diagnosis of her lung mass upcoming. -Continue follow-up with oncology and urology.  Chronic kidney disease, stage 3a ([facility]) -Baseline creatinine between 1.4 and 1.5.  Morbid obesity ([place])  Mixed hyperlipidemia -Last LDL was 127 in April 2021, she is not on statin therapy.  Postoperative hypothyroidism  - Plan: TSH -She is on levothyroxine 250 mcg daily.  Essential hypertension -Well-controlled.  Post-menopause on HRT (hormone replacement therapy)  -I will go ahead and refill her hormone replacement therapy today, however I have discussed with her that my preference would be to wean her completely off of it. -She agrees, she will start taking it every other day and if tolerated after couple weeks we will try doing every third day.    Patient Instructions  -Nice seeing you today!!  -Lab work today; will notify you once results are available.  -Schedule follow up in 6 months or sooner as needed.     [name], MD [facility]

## 2020-10-03 NOTE — Telephone Encounter (Signed)
Patient wants just her Epi-pen resent to [facility] on [address].

## 2020-10-03 NOTE — Patient Instructions (Signed)
-  Nice seeing you today!!  -Lab work today; will notify you once results are available.  -Schedule follow up in 6 months or sooner as needed.   

## 2020-10-07 ENCOUNTER — Encounter: Payer: Self-pay | Admitting: Thoracic Surgery (Cardiothoracic Vascular Surgery)

## 2020-10-07 ENCOUNTER — Institutional Professional Consult (permissible substitution) (INDEPENDENT_AMBULATORY_CARE_PROVIDER_SITE_OTHER): Payer: Medicare HMO | Admitting: Thoracic Surgery (Cardiothoracic Vascular Surgery)

## 2020-10-07 ENCOUNTER — Other Ambulatory Visit: Payer: Self-pay | Admitting: *Deleted

## 2020-10-07 ENCOUNTER — Other Ambulatory Visit: Payer: Self-pay

## 2020-10-07 ENCOUNTER — Other Ambulatory Visit: Payer: Self-pay | Admitting: Internal Medicine

## 2020-10-07 VITALS — BP 147/75 | HR 79 | Resp 20 | Ht 66.0 in | Wt 245.0 lb

## 2020-10-07 DIAGNOSIS — R918 Other nonspecific abnormal finding of lung field: Secondary | ICD-10-CM

## 2020-10-07 DIAGNOSIS — J3089 Other allergic rhinitis: Secondary | ICD-10-CM | POA: Diagnosis not present

## 2020-10-07 DIAGNOSIS — J3081 Allergic rhinitis due to animal (cat) (dog) hair and dander: Secondary | ICD-10-CM | POA: Diagnosis not present

## 2020-10-07 DIAGNOSIS — E890 Postprocedural hypothyroidism: Secondary | ICD-10-CM

## 2020-10-07 DIAGNOSIS — R911 Solitary pulmonary nodule: Secondary | ICD-10-CM

## 2020-10-07 DIAGNOSIS — J301 Allergic rhinitis due to pollen: Secondary | ICD-10-CM | POA: Diagnosis not present

## 2020-10-07 MED ORDER — LEVOTHYROXINE SODIUM 125 MCG PO TABS: 125.0000 ug | ORAL_TABLET | Freq: Every day | ORAL | 1 refills | Status: DC

## 2020-10-07 NOTE — Progress Notes (Signed)
6 Minute Walk Test Results  Patient: Maria Marquez Date:  10/07/2020   Supplemental O2 during test? No      Baseline   End  Time  1217      Heartrate 107 89     Dyspnea Yes No        Fatigue Yes No         O2 sat  98% 98%       Blood pressure 157/71 147/75       Patient ambulated at a moderate/slow pace for a total distance of 784 feet with 1 stops.  Ambulation was limited primarily due to shortness of breath/ leg cramping.  Overall the test was tolerated well.

## 2020-10-07 NOTE — Progress Notes (Signed)
PCP is [name], Rayford Halsted, MD Referring Provider is [name], MD  Chief Complaint  Patient presents with  . Lung Mass    Surgical consult, PET Scan 09/19/20,       HPI: Maria Marquez is sent for consultation regarding a right upper lobe lung nodule.  Maria Marquez is a 75 year old Marquez with a history of tobacco abuse, hypertension, hyperlipidemia, reflux, hypothyroidism, arthritis, stage III chronic kidney disease, and urothelial carcinoma of the right ureter.  She was diagnosed with urothelial carcinoma last fall.  She had a CT of the chest as part of her staging.  It showed a 1.1 x 1.3 cm right upper lobe nodule.  On PET CT the nodules markedly hypermetabolic with an SUV of 11.  She had a navigational bronchoscopy with Dr.Byrum which was nondiagnostic.  She was treated with gemcitabine and cisplatin.  She had 4 cycles of the gemcitabine but only 3 of the cisplatin.  She completed therapy in December 2021.  She recently had a follow-up with Dr. Alen Marquez and had a repeat PET/CT.  It showed decrease in size and density of the right upper lobe nodule and a significant decrease in metabolic activity as well.  She quit smoking about 4 5 months ago when she was first diagnosed with cancer.  She smoked about a half a pack a day for 50+ years prior to quitting.  She gets short of breath with heavy exertion but says she can walk about 2 blocks.  She has felt tired and rundown since receiving chemotherapy.  Her appetite is not great but she is actually gained some weight.  She denies chest pain, pressure, or tightness at rest or with exertion. Past Medical History:  Diagnosis Date  . Acute blood loss anemia   . Arthritis   . Back pain   . Bilious vomiting with nausea   . 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 Chronic kidney disease, stage 3a ([place]) 12/31/2019  . Complication of anesthesia   . Constipation   . Dyspnea    On exertion and rest at times  . Gastroesophageal reflux disease  without esophagitis 06/17/2015  . GERD (gastroesophageal reflux disease)   . HLD (hyperlipidemia)   . Hypertension   . Hypothyroid   . Joint pain   . Leg edema   . Leukocytosis   . Multiple food allergies   . Myalgia due to statin 11/23/2018  . Osteoarthritis of hip    Right  . Pneumonia   . PONV (postoperative nausea and vomiting)   . Postmenopausal hormone replacement therapy 06/17/2015  . Pre-diabetes   . Prediabetes   . Sessile colonic polyp 10/04/2017   Colonoscopy 12/2015, Dr. Fuller Plan, repeat q 5 years.    Past Surgical History:  Procedure Laterality Date  . CYSTOSCOPY WITH URETEROSCOPY Right 04/18/2020   Procedure: CYSTOSCOPY WITH URETEROSCOPY, BIOPSY;  Surgeon: Ceasar Mons, MD;  Location: [facility];  Service: Urology;  Laterality: Right;  ONLY NEEDS 45 MIN  . EYE SURGERY  1987  . HERNIA REPAIR    . IR IMAGING GUIDED PORT INSERTION  06/04/2020  . THYROID LOBECTOMY     right side  . TOTAL HIP ARTHROPLASTY Right 01/28/2016   Procedure: RIGHT TOTAL HIP ARTHROPLASTY ANTERIOR APPROACH;  Surgeon: [name], MD;  Location: [facility];  Service: Orthopedics;  Laterality: Right;  [name] VIDEO BRONCHOSCOPY WITH ENDOBRONCHIAL NAVIGATION N/A 06/06/2020   Procedure: VIDEO BRONCHOSCOPY WITH ENDOBRONCHIAL NAVIGATION;  Surgeon: Collene Gobble,  MD;  Location: [facility];  Service: Thoracic;  Laterality: N/A;    Family History  Problem Relation Age of Onset  . Arthritis Mother   . Breast cancer Mother   . Schizophrenia Mother   . Diabetes Father   . Heart disease Father   . Kidney disease Father   . Stroke Sister   . Lung cancer Brother   . Heart disease Brother   . Lung cancer Brother   . Heart disease Brother   . Vision loss Brother        Glaucoma  . Alcohol abuse Brother     Social History Social History   Tobacco Use  . Smoking status: Former Smoker    Packs/day: 1.00    Years: 45.00    Pack years: 45.00    Types: Cigarettes    Quit date: 04/18/2020    Years since  quitting: 0.4  . Smokeless tobacco: Former Systems developer  . Tobacco comment: Pt quit smoking 3 weeks ago.   Vaping Use  . Vaping Use: Never used  Substance Use Topics  . Alcohol use: Yes    Comment: rarely  . Drug use: No    Current Outpatient Medications  Medication Sig Dispense Refill  . acetaminophen (TYLENOL) 500 MG tablet Take 1,000 mg by mouth every 8 (eight) hours as needed for moderate pain.     [name] albuterol (VENTOLIN HFA) 108 (90 Base) MCG/ACT inhaler Inhale 1-2 puffs into the lungs every 6 (six) hours as needed for wheezing or shortness of breath. 18 g 0  . EPINEPHrine 0.3 mg/0.3 mL IJ SOAJ injection Inject 0.3 mg into the muscle as needed for anaphylaxis. 1 each 2  . estradiol-norethindrone (ACTIVELLA) 1-0.5 MG tablet Take 1 tablet by mouth daily. 28 tablet 0  . ezetimibe (ZETIA) 10 MG tablet TAKE 1 TABLET EVERY DAY (Patient taking differently: Take 10 mg by mouth daily.) 90 tablet 3  . levothyroxine (SYNTHROID) 125 MCG tablet Take 1 tablet (125 mcg total) by mouth at bedtime. 90 tablet 1  . lidocaine-prilocaine (EMLA) cream Apply 1 application topically as needed. 30 g 0  . losartan-hydrochlorothiazide (HYZAAR) 50-12.5 MG tablet TAKE 1 TABLET EVERY DAY (Patient taking differently: Take 1 tablet by mouth daily.) 90 tablet 3  . oxybutynin (DITROPAN) 5 MG tablet     . SHINGRIX injection     . simvastatin (ZOCOR) 10 MG tablet TAKE 1 TABLET (10 MG TOTAL) BY MOUTH 2 (TWO) TIMES A WEEK. (Patient taking differently: Take 10 mg by mouth 2 (two) times a week. Saturday and Sunday) 26 tablet 3  . TURMERIC CURCUMIN PO Take 2,250 mg by mouth daily.    [name] XIIDRA 5 % SOLN Place 1 drop into both eyes daily as needed (dry eyes).      No current facility-administered medications for this visit.    Allergies  Allergen Reactions  . Iodine Shortness Of Breath  . Keflex [Cephalexin] Shortness Of Breath, Rash and Tinitus  . Lasix [Furosemide] Itching  . Rosuvastatin Other (See Comments)    Muscle Pain  in Thighs  . Shellfish Allergy Hives  . Sulfa Antibiotics Nausea Only  . Latex Rash  . Lisinopril Cough  . Tramadol Itching    Review of Systems  Constitutional: Positive for appetite change, fatigue and unexpected weight change (weight gain).  HENT: Negative for trouble swallowing and voice change.   Respiratory: Positive for shortness of breath.   Cardiovascular: Positive for leg swelling. Negative for chest pain and palpitations.  Gastrointestinal:  Negative for abdominal distention and abdominal pain.  Genitourinary: Positive for frequency. Negative for dysuria.  Musculoskeletal: Positive for arthralgias and joint swelling.  Neurological: Negative for seizures, syncope and weakness.  Hematological: Negative for adenopathy. Does not bruise/bleed easily.  All other systems reviewed and are negative.   BP (!) 147/75   Pulse 79   Resp 20   Ht 5\' 6"  (1.676 m)   Wt 245 lb (111.1 kg)   SpO2 100% Comment: RA  BMI 39.54 kg/m  Physical Exam Constitutional:      General: She is not in acute distress.    Appearance: She is obese.  HENT:     Head: Normocephalic and atraumatic.  Cardiovascular:     Rate and Rhythm: Normal rate and regular rhythm.     Pulses: Normal pulses.     Heart sounds: Normal heart sounds. No murmur heard. No friction rub. No gallop.   Pulmonary:     Effort: Pulmonary effort is normal. No respiratory distress.     Breath sounds: No wheezing.     Comments: Diminished BS bilaterally Abdominal:     General: There is no distension.     Palpations: Abdomen is soft.     Tenderness: There is no abdominal tenderness.  Musculoskeletal:     Cervical back: Neck supple.  Lymphadenopathy:     Cervical: No cervical adenopathy.  Skin:    General: Skin is warm and dry.  Neurological:     General: No focal deficit present.     Mental Status: She is alert and oriented to person, place, and time.     Cranial Nerves: No cranial nerve deficit.     Motor: No weakness.      Gait: Gait normal.    Diagnostic Tests: NUCLEAR MEDICINE PET SKULL BASE TO THIGH  TECHNIQUE: 12.7 mCi F-18 FDG was injected intravenously. Full-ring PET imaging was performed from the skull base to thigh after the radiotracer. CT data was obtained and used for attenuation correction and anatomic localization.  Fasting blood glucose: 120 mg/dl  COMPARISON:  PET-CT 10 1121, super D chest 06/04/2020  FINDINGS: Mediastinal blood pool activity: SUV max 3.23  Liver activity: SUV max NA  NECK: No hypermetabolic lymph nodes in the neck.  Incidental CT findings: none  CHEST: RIGHT upper lobe pulmonary nodule of concern is decreased in central density compared to prior. Previously the soft tissues central component measures 16 x 12 mm on PET CT 06/02/2020. Nowthe a solid component is less well-defined centrally measuring approximately 10 mm x 11 mm. (Image 60/4).  Additionally, metabolic activity of this RIGHT upper lobe nodule is significantly decreased with SUV max equal not 2.9 decreased from SUV max equal 11.1.  Resolution of hypermetabolic paratracheal lymph node. No enlarged mediastinal lymph nodes are present.  No new pulmonary nodularity.  Incidental CT findings: none  ABDOMEN/PELVIS: No evidence of malignancy within the pelvis. No hypermetabolic pelvic lymph nodes.  No evidence of liver metastasis or periaortic adenopathy.  Incidental CT findings: none  SKELETON: No aggressive osseous lesion.  Incidental CT findings: none  IMPRESSION: 1. Interval decrease in volume and metabolic activity of the RIGHT upper lobe pulmonary nodule. 2. No residual hypermetabolic mediastinal lymph nodes. 3. No evidence of malignancy in the abdomen pelvis.   Electronically Signed   By: Suzy Bouchard M.D.   On: 09/19/2020 11:18 I personally reviewed the PET images and compared to the previous PET from October.  There is been a significant decrease in  the  size and metabolic activity of the right upper lobe nodule  Impression: Maria Marquez is a 75 year old Marquez with a history of tobacco abuse, hypertension, hyperlipidemia, reflux, hypothyroidism, arthritis, stage III chronic kidney disease, and urothelial carcinoma of the right ureter.    She was found to have a right upper lobe lung nodule back in of the fall.  She had a navigational bronchoscopy but that was nondiagnostic.  The question was whether this was malignant, and if so whether it was a primary lung cancer or metastatic from her urothelial carcinoma.  Infectious and inflammatory nodules are also in the differential diagnosis.  This question is further compounded by the change in the nodule over the past 4 months.  It has gotten quite a bit smaller and less dense and significantly less metabolically active on PET.  That could be signs is not malignant or could be a response to chemotherapy.  This lesion is relatively central the right upper lobe.  Even a wedge resection would remove a significant amount of the right upper lobe.  To treat it as a primary lung malignancy would really necessitate a lobectomy.  Obviously that would be suboptimal if it was metastatic.  I think the best option would be to attempt to repeat her navigational bronchoscopy.  I discussed this with Maria Marquez and her daughter.  They understand there is no guarantee will be able to get a directed biopsy of the lesion or establish a definitive diagnosis.  However, if successful, it could potentially save her from having a major operation.  They are aware of the procedure and its risks.  But I did review the indications, risk, benefits, and alternatives.  They understand we will do it in the operating room under general anesthesia.  We will plan to do it on an outpatient basis.  They understand the risks include those associated with general anesthesia as well as pneumothorax, bleeding, and failure to make a  diagnosis.  In the meantime we will go ahead and continue with her work-up to see if she is a candidate for surgical resection.  We will do a 6-minute walk test and also get pulmonary function testing with and without bronchodilators.  Plan: -Electromagnetic navigational bronchoscopy for biopsy on Friday, 10/10/2020 -6-minute walk test and pulmonary function testing with and without bronchodilators to assess potential for surgical resection.  I spent 45 minutes today in review of records, images, and consultation with Maria Marquez.  [name], MD [facility] [phone]

## 2020-10-07 NOTE — H&P (View-Only) (Signed)
PCP is [name], Rayford Halsted, MD Referring Provider is [name], MD  Chief Complaint  Patient presents with  . Lung Mass    Surgical consult, PET Scan 09/19/20,       HPI: [name] is sent for consultation regarding a right upper lobe lung nodule.  Maria Marquez is a 75 year old woman with a history of tobacco abuse, hypertension, hyperlipidemia, reflux, hypothyroidism, arthritis, stage III chronic kidney disease, and urothelial carcinoma of the right ureter.  She was diagnosed with urothelial carcinoma last fall.  She had a CT of the chest as part of her staging.  It showed a 1.1 x 1.3 cm right upper lobe nodule.  On PET CT the nodules markedly hypermetabolic with an SUV of 11.  She had a navigational bronchoscopy with Dr.Byrum which was nondiagnostic.  She was treated with gemcitabine and cisplatin.  She had 4 cycles of the gemcitabine but only 3 of the cisplatin.  She completed therapy in December 2021.  She recently had a follow-up with Dr. Alen Blew and had a repeat PET/CT.  It showed decrease in size and density of the right upper lobe nodule and a significant decrease in metabolic activity as well.  She quit smoking about 4 5 months ago when she was first diagnosed with cancer.  She smoked about a half a pack a day for 50+ years prior to quitting.  She gets short of breath with heavy exertion but says she can walk about 2 blocks.  She has felt tired and rundown since receiving chemotherapy.  Her appetite is not great but she is actually gained some weight.  She denies chest pain, pressure, or tightness at rest or with exertion. Past Medical History:  Diagnosis Date  . Acute blood loss anemia   . Arthritis   . Back pain   . Bilious vomiting with nausea   . 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 Chronic kidney disease, stage 3a ([place]) 12/31/2019  . Complication of anesthesia   . Constipation   . Dyspnea    On exertion and rest at times  . Gastroesophageal reflux disease  without esophagitis 06/17/2015  . GERD (gastroesophageal reflux disease)   . HLD (hyperlipidemia)   . Hypertension   . Hypothyroid   . Joint pain   . Leg edema   . Leukocytosis   . Multiple food allergies   . Myalgia due to statin 11/23/2018  . Osteoarthritis of hip    Right  . Pneumonia   . PONV (postoperative nausea and vomiting)   . Postmenopausal hormone replacement therapy 06/17/2015  . Pre-diabetes   . Prediabetes   . Sessile colonic polyp 10/04/2017   Colonoscopy 12/2015, Dr. Fuller Plan, repeat q 5 years.    Past Surgical History:  Procedure Laterality Date  . CYSTOSCOPY WITH URETEROSCOPY Right 04/18/2020   Procedure: CYSTOSCOPY WITH URETEROSCOPY, BIOPSY;  Surgeon: Ceasar Mons, MD;  Location: [facility];  Service: Urology;  Laterality: Right;  ONLY NEEDS 45 MIN  . EYE SURGERY  1987  . HERNIA REPAIR    . IR IMAGING GUIDED PORT INSERTION  06/04/2020  . THYROID LOBECTOMY     right side  . TOTAL HIP ARTHROPLASTY Right 01/28/2016   Procedure: RIGHT TOTAL HIP ARTHROPLASTY ANTERIOR APPROACH;  Surgeon: [name], MD;  Location: [facility];  Service: Orthopedics;  Laterality: Right;  [name] VIDEO BRONCHOSCOPY WITH ENDOBRONCHIAL NAVIGATION N/A 06/06/2020   Procedure: VIDEO BRONCHOSCOPY WITH ENDOBRONCHIAL NAVIGATION;  Surgeon: Collene Gobble,  MD;  Location: [facility];  Service: Thoracic;  Laterality: N/A;    Family History  Problem Relation Age of Onset  . Arthritis Mother   . Breast cancer Mother   . Schizophrenia Mother   . Diabetes Father   . Heart disease Father   . Kidney disease Father   . Stroke Sister   . Lung cancer Brother   . Heart disease Brother   . Lung cancer Brother   . Heart disease Brother   . Vision loss Brother        Glaucoma  . Alcohol abuse Brother     Social History Social History   Tobacco Use  . Smoking status: Former Smoker    Packs/day: 1.00    Years: 45.00    Pack years: 45.00    Types: Cigarettes    Quit date: 04/18/2020    Years since  quitting: 0.4  . Smokeless tobacco: Former Systems developer  . Tobacco comment: Pt quit smoking 3 weeks ago.   Vaping Use  . Vaping Use: Never used  Substance Use Topics  . Alcohol use: Yes    Comment: rarely  . Drug use: No    Current Outpatient Medications  Medication Sig Dispense Refill  . acetaminophen (TYLENOL) 500 MG tablet Take 1,000 mg by mouth every 8 (eight) hours as needed for moderate pain.     [name] albuterol (VENTOLIN HFA) 108 (90 Base) MCG/ACT inhaler Inhale 1-2 puffs into the lungs every 6 (six) hours as needed for wheezing or shortness of breath. 18 g 0  . EPINEPHrine 0.3 mg/0.3 mL IJ SOAJ injection Inject 0.3 mg into the muscle as needed for anaphylaxis. 1 each 2  . estradiol-norethindrone (ACTIVELLA) 1-0.5 MG tablet Take 1 tablet by mouth daily. 28 tablet 0  . ezetimibe (ZETIA) 10 MG tablet TAKE 1 TABLET EVERY DAY (Patient taking differently: Take 10 mg by mouth daily.) 90 tablet 3  . levothyroxine (SYNTHROID) 125 MCG tablet Take 1 tablet (125 mcg total) by mouth at bedtime. 90 tablet 1  . lidocaine-prilocaine (EMLA) cream Apply 1 application topically as needed. 30 g 0  . losartan-hydrochlorothiazide (HYZAAR) 50-12.5 MG tablet TAKE 1 TABLET EVERY DAY (Patient taking differently: Take 1 tablet by mouth daily.) 90 tablet 3  . oxybutynin (DITROPAN) 5 MG tablet     . SHINGRIX injection     . simvastatin (ZOCOR) 10 MG tablet TAKE 1 TABLET (10 MG TOTAL) BY MOUTH 2 (TWO) TIMES A WEEK. (Patient taking differently: Take 10 mg by mouth 2 (two) times a week. Saturday and Sunday) 26 tablet 3  . TURMERIC CURCUMIN PO Take 2,250 mg by mouth daily.    [name] XIIDRA 5 % SOLN Place 1 drop into both eyes daily as needed (dry eyes).      No current facility-administered medications for this visit.    Allergies  Allergen Reactions  . Iodine Shortness Of Breath  . Keflex [Cephalexin] Shortness Of Breath, Rash and Tinitus  . Lasix [Furosemide] Itching  . Rosuvastatin Other (See Comments)    Muscle Pain  in Thighs  . Shellfish Allergy Hives  . Sulfa Antibiotics Nausea Only  . Latex Rash  . Lisinopril Cough  . Tramadol Itching    Review of Systems  Constitutional: Positive for appetite change, fatigue and unexpected weight change (weight gain).  HENT: Negative for trouble swallowing and voice change.   Respiratory: Positive for shortness of breath.   Cardiovascular: Positive for leg swelling. Negative for chest pain and palpitations.  Gastrointestinal:  Negative for abdominal distention and abdominal pain.  Genitourinary: Positive for frequency. Negative for dysuria.  Musculoskeletal: Positive for arthralgias and joint swelling.  Neurological: Negative for seizures, syncope and weakness.  Hematological: Negative for adenopathy. Does not bruise/bleed easily.  All other systems reviewed and are negative.   BP (!) 147/75   Pulse 79   Resp 20   Ht 5\' 6"  (1.676 m)   Wt 245 lb (111.1 kg)   SpO2 100% Comment: RA  BMI 39.54 kg/m  Physical Exam Constitutional:      General: She is not in acute distress.    Appearance: She is obese.  HENT:     Head: Normocephalic and atraumatic.  Cardiovascular:     Rate and Rhythm: Normal rate and regular rhythm.     Pulses: Normal pulses.     Heart sounds: Normal heart sounds. No murmur heard. No friction rub. No gallop.   Pulmonary:     Effort: Pulmonary effort is normal. No respiratory distress.     Breath sounds: No wheezing.     Comments: Diminished BS bilaterally Abdominal:     General: There is no distension.     Palpations: Abdomen is soft.     Tenderness: There is no abdominal tenderness.  Musculoskeletal:     Cervical back: Neck supple.  Lymphadenopathy:     Cervical: No cervical adenopathy.  Skin:    General: Skin is warm and dry.  Neurological:     General: No focal deficit present.     Mental Status: She is alert and oriented to person, place, and time.     Cranial Nerves: No cranial nerve deficit.     Motor: No weakness.      Gait: Gait normal.    Diagnostic Tests: NUCLEAR MEDICINE PET SKULL BASE TO THIGH  TECHNIQUE: 12.7 mCi F-18 FDG was injected intravenously. Full-ring PET imaging was performed from the skull base to thigh after the radiotracer. CT data was obtained and used for attenuation correction and anatomic localization.  Fasting blood glucose: 120 mg/dl  COMPARISON:  PET-CT 10 1121, super D chest 06/04/2020  FINDINGS: Mediastinal blood pool activity: SUV max 3.23  Liver activity: SUV max NA  NECK: No hypermetabolic lymph nodes in the neck.  Incidental CT findings: none  CHEST: RIGHT upper lobe pulmonary nodule of concern is decreased in central density compared to prior. Previously the soft tissues central component measures 16 x 12 mm on PET CT 06/02/2020. Nowthe a solid component is less well-defined centrally measuring approximately 10 mm x 11 mm. (Image 60/4).  Additionally, metabolic activity of this RIGHT upper lobe nodule is significantly decreased with SUV max equal not 2.9 decreased from SUV max equal 11.1.  Resolution of hypermetabolic paratracheal lymph node. No enlarged mediastinal lymph nodes are present.  No new pulmonary nodularity.  Incidental CT findings: none  ABDOMEN/PELVIS: No evidence of malignancy within the pelvis. No hypermetabolic pelvic lymph nodes.  No evidence of liver metastasis or periaortic adenopathy.  Incidental CT findings: none  SKELETON: No aggressive osseous lesion.  Incidental CT findings: none  IMPRESSION: 1. Interval decrease in volume and metabolic activity of the RIGHT upper lobe pulmonary nodule. 2. No residual hypermetabolic mediastinal lymph nodes. 3. No evidence of malignancy in the abdomen pelvis.   Electronically Signed   By: Suzy Bouchard M.D.   On: 09/19/2020 11:18 I personally reviewed the PET images and compared to the previous PET from October.  There is been a significant decrease in  the  size and metabolic activity of the right upper lobe nodule  Impression: Maria Marquez is a 75 year old woman with a history of tobacco abuse, hypertension, hyperlipidemia, reflux, hypothyroidism, arthritis, stage III chronic kidney disease, and urothelial carcinoma of the right ureter.    She was found to have a right upper lobe lung nodule back in of the fall.  She had a navigational bronchoscopy but that was nondiagnostic.  The question was whether this was malignant, and if so whether it was a primary lung cancer or metastatic from her urothelial carcinoma.  Infectious and inflammatory nodules are also in the differential diagnosis.  This question is further compounded by the change in the nodule over the past 4 months.  It has gotten quite a bit smaller and less dense and significantly less metabolically active on PET.  That could be signs is not malignant or could be a response to chemotherapy.  This lesion is relatively central the right upper lobe.  Even a wedge resection would remove a significant amount of the right upper lobe.  To treat it as a primary lung malignancy would really necessitate a lobectomy.  Obviously that would be suboptimal if it was metastatic.  I think the best option would be to attempt to repeat her navigational bronchoscopy.  I discussed this with Maria Marquez and her daughter.  They understand there is no guarantee will be able to get a directed biopsy of the lesion or establish a definitive diagnosis.  However, if successful, it could potentially save her from having a major operation.  They are aware of the procedure and its risks.  But I did review the indications, risk, benefits, and alternatives.  They understand we will do it in the operating room under general anesthesia.  We will plan to do it on an outpatient basis.  They understand the risks include those associated with general anesthesia as well as pneumothorax, bleeding, and failure to make a  diagnosis.  In the meantime we will go ahead and continue with her work-up to see if she is a candidate for surgical resection.  We will do a 6-minute walk test and also get pulmonary function testing with and without bronchodilators.  Plan: -Electromagnetic navigational bronchoscopy for biopsy on Friday, 10/10/2020 -6-minute walk test and pulmonary function testing with and without bronchodilators to assess potential for surgical resection.  I spent 45 minutes today in review of records, images, and consultation with Maria Marquez and in consultation with Dr. Alen Blew.  [name], MD [facility] [phone]

## 2020-10-08 ENCOUNTER — Other Ambulatory Visit (HOSPITAL_COMMUNITY)
Admission: RE | Admit: 2020-10-08 | Discharge: 2020-10-08 | Disposition: A | Discharge status: Home / Self Care | Payer: Medicare HMO | Source: Ambulatory Visit | Attending: Thoracic Surgery (Cardiothoracic Vascular Surgery) | Admitting: Thoracic Surgery (Cardiothoracic Vascular Surgery)

## 2020-10-08 DIAGNOSIS — Z20822 Contact with and (suspected) exposure to covid-19: Secondary | ICD-10-CM | POA: Diagnosis not present

## 2020-10-08 DIAGNOSIS — Z01812 Encounter for preprocedural laboratory examination: Secondary | ICD-10-CM | POA: Diagnosis not present

## 2020-10-08 LAB — SARS CORONAVIRUS 2 (TAT 6-24 HRS): SARS Coronavirus 2: NEGATIVE

## 2020-10-08 NOTE — Progress Notes (Signed)
Surgical Instructions    Your procedure is scheduled on 10/10/20.  Report to [facility] Main Entrance "A" at 09:30 A.M., then check in with the Admitting office.  Call this number if you have problems the morning of surgery:  [phone]   If you have any questions prior to your surgery date call [phone]: Open Monday-Friday 8am-4pm    Remember:  Do not eat or drink after midnight the night before your surgery     Take these medicines the morning of surgery with A SIP OF WATER  acetaminophen (TYLENOL) albuterol (VENTOLIN HFA)- if needed (bring inhaler with you the day of surgery) ezetimibe (ZETIA)  levothyroxine (SYNTHROID Eye drops if needed  As of today, STOP taking any Aspirin (unless otherwise instructed by your surgeon) Aleve, Naproxen, Ibuprofen, Motrin, Advil, Goody's, BC's, all herbal medications, fish oil, and all vitamins.                     Do not wear jewelry, make up, or nail polish            Do not wear lotions, powders, perfumes/colognes, or deodorant.            Do not shave 48 hours prior to surgery.              Do not bring valuables to the hospital.            [facility] is not responsible for any belongings or valuables.  Do NOT Smoke (Tobacco/Vaping) or drink Alcohol 24 hours prior to your procedure If you use a CPAP at night, you may bring all equipment for your overnight stay.   Contacts, glasses, dentures or bridgework may not be worn into surgery, please bring cases for these belongings   For patients admitted to the hospital, discharge time will be determined by your treatment team.   Patients discharged the day of surgery will not be allowed to drive home, and someone needs to stay with them for 24 hours.    Special instructions:   [place]- Preparing For Surgery  Before surgery, you can play an important role. Because skin is not sterile, your skin needs to be as free of germs as possible. You can reduce the number of germs on your  skin by washing with CHG (chlorahexidine gluconate) Soap before surgery.  CHG is an antiseptic cleaner which kills germs and bonds with the skin to continue killing germs even after washing.    Oral Hygiene is also important to reduce your risk of infection.  Remember - BRUSH YOUR TEETH THE MORNING OF SURGERY WITH YOUR REGULAR TOOTHPASTE  Please do not use if you have an allergy to CHG or antibacterial soaps. If your skin becomes reddened/irritated stop using the CHG.  Do not shave (including legs and underarms) for at least 48 hours prior to first CHG shower. It is OK to shave your face.  Please follow these instructions carefully.   1. Shower the NIGHT BEFORE SURGERY and the MORNING OF SURGERY  2. If you chose to wash your hair, wash your hair first as usual with your normal shampoo.  3. After you shampoo, rinse your hair and body thoroughly to remove the shampoo.  4. Wash Face and genitals (private parts) with your normal soap.   5.  Shower the NIGHT BEFORE SURGERY and the MORNING OF SURGERY with CHG Soap.   6. Use CHG Soap as you would any other liquid soap. You can apply CHG  directly to the skin and wash gently with a scrungie or a clean washcloth.   7. Apply the CHG Soap to your body ONLY FROM THE NECK DOWN.  Do not use on open wounds or open sores. Avoid contact with your eyes, ears, mouth and genitals (private parts). Wash Face and genitals (private parts)  with your normal soap.   8. Wash thoroughly, paying special attention to the area where your surgery will be performed.  9. Thoroughly rinse your body with warm water from the neck down.  10. DO NOT shower/wash with your normal soap after using and rinsing off the CHG Soap.  11. Pat yourself dry with a CLEAN TOWEL.  12. Wear CLEAN PAJAMAS to bed the night before surgery  13. Place CLEAN SHEETS on your bed the night before your surgery  14. DO NOT SLEEP WITH PETS.   Day of Surgery: Take a shower.  Wear  Clean/Comfortable clothing the morning of surgery Do not apply any deodorants/lotions.   Remember to brush your teeth WITH YOUR REGULAR TOOTHPASTE.   Please read over the following fact sheets that you were given.

## 2020-10-09 ENCOUNTER — Other Ambulatory Visit: Payer: Self-pay

## 2020-10-09 ENCOUNTER — Inpatient Hospital Stay (HOSPITAL_COMMUNITY): Admission: RE | Admit: 2020-10-09 | Payer: Medicare HMO | Source: Ambulatory Visit

## 2020-10-09 ENCOUNTER — Encounter (HOSPITAL_COMMUNITY)
Admission: RE | Admit: 2020-10-09 | Discharge: 2020-10-09 | Disposition: A | Discharge status: Home / Self Care | Payer: Medicare HMO | Source: Ambulatory Visit | Attending: Thoracic Surgery (Cardiothoracic Vascular Surgery) | Admitting: Thoracic Surgery (Cardiothoracic Vascular Surgery)

## 2020-10-09 ENCOUNTER — Ambulatory Visit (HOSPITAL_COMMUNITY)
Admission: RE | Admit: 2020-10-09 | Discharge: 2020-10-09 | Disposition: A | Discharge status: Home / Self Care | Payer: Medicare HMO | Source: Ambulatory Visit | Attending: Thoracic Surgery (Cardiothoracic Vascular Surgery) | Admitting: Thoracic Surgery (Cardiothoracic Vascular Surgery)

## 2020-10-09 ENCOUNTER — Other Ambulatory Visit: Payer: Self-pay | Admitting: Internal Medicine

## 2020-10-09 ENCOUNTER — Encounter (HOSPITAL_COMMUNITY): Payer: Self-pay

## 2020-10-09 DIAGNOSIS — Z87891 Personal history of nicotine dependence: Secondary | ICD-10-CM | POA: Diagnosis not present

## 2020-10-09 DIAGNOSIS — R911 Solitary pulmonary nodule: Secondary | ICD-10-CM | POA: Insufficient documentation

## 2020-10-09 DIAGNOSIS — Z01818 Encounter for other preprocedural examination: Secondary | ICD-10-CM | POA: Insufficient documentation

## 2020-10-09 DIAGNOSIS — R942 Abnormal results of pulmonary function studies: Secondary | ICD-10-CM | POA: Insufficient documentation

## 2020-10-09 DIAGNOSIS — E890 Postprocedural hypothyroidism: Secondary | ICD-10-CM

## 2020-10-09 LAB — CBC
HCT: 34.3 % — ABNORMAL LOW (ref 36.0–46.0)
Hemoglobin: 11.2 g/dL — ABNORMAL LOW (ref 12.0–15.0)
MCH: 33 pg (ref 26.0–34.0)
MCHC: 32.7 g/dL (ref 30.0–36.0)
MCV: 101.2 fL — ABNORMAL HIGH (ref 80.0–100.0)
Platelets: 242 10*3/uL (ref 150–400)
RBC: 3.39 MIL/uL — ABNORMAL LOW (ref 3.87–5.11)
RDW: 14.7 % (ref 11.5–15.5)
WBC: 7.1 10*3/uL (ref 4.0–10.5)
nRBC: 0 % (ref 0.0–0.2)

## 2020-10-09 LAB — PULMONARY FUNCTION TEST
DL/VA % pred: 83 %
DL/VA: 3.38 ml/min/mmHg/L
DLCO cor % pred: 77 %
DLCO cor: 15.94 ml/min/mmHg
DLCO unc % pred: 71 %
DLCO unc: 14.75 ml/min/mmHg
FEF 25-75 Post: 2.22 L/sec
FEF 25-75 Pre: 2.14 L/sec
FEF2575-%Change-Post: 3 %
FEF2575-%Pred-Post: 115 %
FEF2575-%Pred-Pre: 111 %
FEV1-%Change-Post: 1 %
FEV1-%Pred-Post: 98 %
FEV1-%Pred-Pre: 96 %
FEV1-Post: 2.36 L
FEV1-Pre: 2.32 L
FEV1FVC-%Change-Post: 1 %
FEV1FVC-%Pred-Pre: 102 %
FEV6-%Change-Post: 0 %
FEV6-%Pred-Post: 98 %
FEV6-%Pred-Pre: 98 %
FEV6-Post: 2.98 L
FEV6-Pre: 3 L
FEV6FVC-%Change-Post: 0 %
FEV6FVC-%Pred-Post: 104 %
FEV6FVC-%Pred-Pre: 104 %
FVC-%Change-Post: 0 %
FVC-%Pred-Post: 94 %
FVC-%Pred-Pre: 94 %
FVC-Post: 3 L
FVC-Pre: 3 L
Post FEV1/FVC ratio: 79 %
Post FEV6/FVC ratio: 100 %
Pre FEV1/FVC ratio: 77 %
Pre FEV6/FVC Ratio: 100 %
RV % pred: 111 %
RV: 2.6 L
TLC % pred: 108 %
TLC: 5.79 L

## 2020-10-09 LAB — COMPREHENSIVE METABOLIC PANEL
ALT: 19 U/L (ref 0–44)
AST: 19 U/L (ref 15–41)
Albumin: 3.7 g/dL (ref 3.5–5.0)
Alkaline Phosphatase: 55 U/L (ref 38–126)
Anion gap: 8 (ref 5–15)
BUN: 32 mg/dL — ABNORMAL HIGH (ref 8–23)
CO2: 26 mmol/L (ref 22–32)
Calcium: 9.2 mg/dL (ref 8.9–10.3)
Chloride: 105 mmol/L (ref 98–111)
Creatinine, Ser: 1.71 mg/dL — ABNORMAL HIGH (ref 0.44–1.00)
GFR, Estimated: 31 mL/min — ABNORMAL LOW (ref >60)
Glucose, Bld: 110 mg/dL — ABNORMAL HIGH (ref 70–99)
Potassium: 3.8 mmol/L (ref 3.5–5.1)
Sodium: 139 mmol/L (ref 135–145)
Total Bilirubin: 0.9 mg/dL (ref 0.3–1.2)
Total Protein: 6.5 g/dL (ref 6.5–8.1)

## 2020-10-09 LAB — APTT: aPTT: 27 seconds (ref 24–36)

## 2020-10-09 LAB — PROTIME-INR
INR: 1 (ref 0.8–1.2)
Prothrombin Time: 12.3 seconds (ref 11.4–15.2)

## 2020-10-09 LAB — HEMOGLOBIN A1C
Hgb A1c MFr Bld: 4.9 % (ref 4.8–5.6)
Mean Plasma Glucose: 93.93 mg/dL

## 2020-10-09 MED ORDER — ALBUTEROL SULFATE (2.5 MG/3ML) 0.083% IN NEBU
2.5000 mg | INHALATION_SOLUTION | Freq: Once | RESPIRATORY_TRACT | Status: AC
  Administered 2020-10-09: 2.5 mg via RESPIRATORY_TRACT

## 2020-10-09 NOTE — Progress Notes (Signed)
Lab ordered.

## 2020-10-09 NOTE — Progress Notes (Signed)
PCP Jerilee Hoh, MD Cardiologist -    Chest x-ray - 10/09/20 EKG - 04/15/20 Stress Test -  ECHO -  Cardiac Cath -   COVID TEST- 10/08/20   Anesthesia review: n/a  Patient denies shortness of breath, fever, cough and chest pain at PAT appointment   All instructions explained to the patient, with a verbal understanding of the material. Patient agrees to go over the instructions while at home for a better understanding. Patient also instructed to self quarantine after being tested for COVID-19. The opportunity to ask questions was provided.

## 2020-10-10 ENCOUNTER — Encounter (HOSPITAL_COMMUNITY)
Admission: RE | Disposition: A | Discharge status: Home / Self Care | Payer: Self-pay | Source: Home / Self Care | Attending: Thoracic Surgery (Cardiothoracic Vascular Surgery)

## 2020-10-10 ENCOUNTER — Ambulatory Visit (HOSPITAL_COMMUNITY): Payer: Medicare HMO | Admitting: Anesthesiology

## 2020-10-10 ENCOUNTER — Other Ambulatory Visit: Payer: Self-pay

## 2020-10-10 ENCOUNTER — Ambulatory Visit (HOSPITAL_COMMUNITY): Payer: Medicare HMO

## 2020-10-10 ENCOUNTER — Ambulatory Visit (HOSPITAL_COMMUNITY)
Admission: RE | Admit: 2020-10-10 | Discharge: 2020-10-10 | Disposition: A | Discharge status: Home / Self Care | Payer: Medicare HMO | Attending: Thoracic Surgery (Cardiothoracic Vascular Surgery) | Admitting: Thoracic Surgery (Cardiothoracic Vascular Surgery)

## 2020-10-10 ENCOUNTER — Encounter (HOSPITAL_COMMUNITY): Payer: Self-pay | Admitting: Thoracic Surgery (Cardiothoracic Vascular Surgery)

## 2020-10-10 DIAGNOSIS — Z882 Allergy status to sulfonamides status: Secondary | ICD-10-CM | POA: Insufficient documentation

## 2020-10-10 DIAGNOSIS — Z91040 Latex allergy status: Secondary | ICD-10-CM | POA: Insufficient documentation

## 2020-10-10 DIAGNOSIS — Z9889 Other specified postprocedural states: Secondary | ICD-10-CM

## 2020-10-10 DIAGNOSIS — Z79890 Hormone replacement therapy (postmenopausal): Secondary | ICD-10-CM | POA: Diagnosis not present

## 2020-10-10 DIAGNOSIS — Z87891 Personal history of nicotine dependence: Secondary | ICD-10-CM | POA: Insufficient documentation

## 2020-10-10 DIAGNOSIS — C3411 Malignant neoplasm of upper lobe, right bronchus or lung: Secondary | ICD-10-CM | POA: Diagnosis not present

## 2020-10-10 DIAGNOSIS — Z888 Allergy status to other drugs, medicaments and biological substances status: Secondary | ICD-10-CM | POA: Diagnosis not present

## 2020-10-10 DIAGNOSIS — I10 Essential (primary) hypertension: Secondary | ICD-10-CM | POA: Diagnosis not present

## 2020-10-10 DIAGNOSIS — Z8554 Personal history of malignant neoplasm of ureter: Secondary | ICD-10-CM | POA: Insufficient documentation

## 2020-10-10 DIAGNOSIS — E039 Hypothyroidism, unspecified: Secondary | ICD-10-CM | POA: Diagnosis not present

## 2020-10-10 DIAGNOSIS — Z419 Encounter for procedure for purposes other than remedying health state, unspecified: Secondary | ICD-10-CM

## 2020-10-10 DIAGNOSIS — Z79899 Other long term (current) drug therapy: Secondary | ICD-10-CM | POA: Insufficient documentation

## 2020-10-10 DIAGNOSIS — R911 Solitary pulmonary nodule: Secondary | ICD-10-CM | POA: Diagnosis not present

## 2020-10-10 DIAGNOSIS — Z881 Allergy status to other antibiotic agents status: Secondary | ICD-10-CM | POA: Diagnosis not present

## 2020-10-10 DIAGNOSIS — R918 Other nonspecific abnormal finding of lung field: Secondary | ICD-10-CM | POA: Diagnosis not present

## 2020-10-10 DIAGNOSIS — E782 Mixed hyperlipidemia: Secondary | ICD-10-CM | POA: Diagnosis not present

## 2020-10-10 HISTORY — PX: VIDEO BRONCHOSCOPY WITH ENDOBRONCHIAL NAVIGATION: SHX6175

## 2020-10-10 SURGERY — VIDEO BRONCHOSCOPY WITH ENDOBRONCHIAL NAVIGATION
Anesthesia: General

## 2020-10-10 MED ORDER — ONDANSETRON HCL 4 MG/2ML IJ SOLN
INTRAMUSCULAR | Status: DC | PRN
  Administered 2020-10-10: 4 mg via INTRAVENOUS

## 2020-10-10 MED ORDER — LACTATED RINGERS IV SOLN: INTRAVENOUS | Status: DC

## 2020-10-10 MED ORDER — PROPOFOL 10 MG/ML IV BOLUS
INTRAVENOUS | Status: DC | PRN
  Administered 2020-10-10: 200 mg via INTRAVENOUS

## 2020-10-10 MED ORDER — DEXAMETHASONE SODIUM PHOSPHATE 10 MG/ML IJ SOLN
INTRAMUSCULAR | Status: DC | PRN
  Administered 2020-10-10: 4 mg via INTRAVENOUS

## 2020-10-10 MED ORDER — ROCURONIUM BROMIDE 10 MG/ML (PF) SYRINGE
PREFILLED_SYRINGE | INTRAVENOUS | Status: DC | PRN
  Administered 2020-10-10: 40 mg via INTRAVENOUS

## 2020-10-10 MED ORDER — ORAL CARE MOUTH RINSE: 15.0000 mL | Freq: Once | OROMUCOSAL | Status: AC

## 2020-10-10 MED ORDER — LIDOCAINE 2% (20 MG/ML) 5 ML SYRINGE
INTRAMUSCULAR | Status: DC | PRN
  Administered 2020-10-10: 60 mg via INTRAVENOUS

## 2020-10-10 MED ORDER — FENTANYL CITRATE (PF) 250 MCG/5ML IJ SOLN
INTRAMUSCULAR | Status: AC
  Filled 2020-10-10: qty 5

## 2020-10-10 MED ORDER — FENTANYL CITRATE (PF) 100 MCG/2ML IJ SOLN: 25.0000 ug | INTRAMUSCULAR | Status: DC | PRN

## 2020-10-10 MED ORDER — 0.9 % SODIUM CHLORIDE (POUR BTL) OPTIME
TOPICAL | Status: DC | PRN
  Administered 2020-10-10: 1000 mL

## 2020-10-10 MED ORDER — ONDANSETRON HCL 4 MG/2ML IJ SOLN: 4.0000 mg | Freq: Once | INTRAMUSCULAR | Status: DC | PRN

## 2020-10-10 MED ORDER — ACETAMINOPHEN 500 MG PO TABS
1000.0000 mg | ORAL_TABLET | Freq: Once | ORAL | Status: AC
  Administered 2020-10-10: 1000 mg via ORAL
  Filled 2020-10-10: qty 2

## 2020-10-10 MED ORDER — PROPOFOL 10 MG/ML IV BOLUS
INTRAVENOUS | Status: AC
  Filled 2020-10-10: qty 20

## 2020-10-10 MED ORDER — EPHEDRINE SULFATE 50 MG/ML IJ SOLN
INTRAMUSCULAR | Status: DC | PRN
  Administered 2020-10-10 (×2): 5 mg via INTRAVENOUS

## 2020-10-10 MED ORDER — FENTANYL CITRATE (PF) 250 MCG/5ML IJ SOLN
INTRAMUSCULAR | Status: DC | PRN
  Administered 2020-10-10: 150 ug via INTRAVENOUS

## 2020-10-10 MED ORDER — EPINEPHRINE PF 1 MG/ML IJ SOLN
INTRAMUSCULAR | Status: AC
  Filled 2020-10-10: qty 1

## 2020-10-10 MED ORDER — PHENYLEPHRINE HCL-NACL 10-0.9 MG/250ML-% IV SOLN
INTRAVENOUS | Status: DC | PRN
  Administered 2020-10-10: 30 ug/min via INTRAVENOUS

## 2020-10-10 MED ORDER — CHLORHEXIDINE GLUCONATE 0.12 % MT SOLN
15.0000 mL | Freq: Once | OROMUCOSAL | Status: AC
  Administered 2020-10-10: 15 mL via OROMUCOSAL
  Filled 2020-10-10: qty 15

## 2020-10-10 MED ORDER — SUGAMMADEX SODIUM 200 MG/2ML IV SOLN
INTRAVENOUS | Status: DC | PRN
  Administered 2020-10-10: 400 mg via INTRAVENOUS

## 2020-10-10 SURGICAL SUPPLY — 45 items
ADAPTER BRONCHOSCOPE OLYMPUS (ADAPTER) ×2 IMPLANT
ADAPTER VALVE BIOPSY EBUS (MISCELLANEOUS) IMPLANT
ADPR BSCP OLMPS EDG (ADAPTER) ×1
ADPTR VALVE BIOPSY EBUS (MISCELLANEOUS)
BLADE CLIPPER SURG (BLADE) ×1 IMPLANT
BRUSH BIOPSY BRONCH 10 SDTNB (MISCELLANEOUS) ×1 IMPLANT
BRUSH SUPERTRAX BIOPSY (INSTRUMENTS) IMPLANT
BRUSH SUPERTRAX NDL-TIP CYTO (INSTRUMENTS) ×4 IMPLANT
CANISTER SUCT 3000ML PPV (MISCELLANEOUS) ×2 IMPLANT
CNTNR URN SCR LID CUP LEK RST (MISCELLANEOUS) ×2 IMPLANT
CONT SPEC 4OZ STRL OR WHT (MISCELLANEOUS) ×4
COVER BACK TABLE 60X90IN (DRAPES) ×2 IMPLANT
FILTER STRAW FLUID ASPIR (MISCELLANEOUS) IMPLANT
FORCEPS BIOP SUPERTRX PREMAR (INSTRUMENTS) ×1 IMPLANT
GAUZE SPONGE 4X4 12PLY STRL (GAUZE/BANDAGES/DRESSINGS) ×2 IMPLANT
GLOVE SURG SIGNA 7.5 PF LTX (GLOVE) ×2 IMPLANT
GOWN STRL REUS W/ TWL XL LVL3 (GOWN DISPOSABLE) ×1 IMPLANT
GOWN STRL REUS W/TWL XL LVL3 (GOWN DISPOSABLE) ×4
KIT CLEAN ENDO COMPLIANCE (KITS) ×2 IMPLANT
KIT ILLUMISITE 180 PROCEDURE (KITS) ×1 IMPLANT
KIT ILLUMISITE 90 PROCEDURE (KITS) IMPLANT
KIT TURNOVER KIT B (KITS) ×2 IMPLANT
MARKER SKIN DUAL TIP RULER LAB (MISCELLANEOUS) ×2 IMPLANT
NDL SUPERTRX PREMARK BIOPSY (NEEDLE) IMPLANT
NEEDLE 22X1 1/2 (OR ONLY) (NEEDLE) ×1 IMPLANT
NEEDLE SUPERTRX PREMARK BIOPSY (NEEDLE) ×2 IMPLANT
NS IRRIG 1000ML POUR BTL (IV SOLUTION) ×2 IMPLANT
OIL SILICONE PENTAX (PARTS (SERVICE/REPAIRS)) ×2 IMPLANT
PAD ARMBOARD 7.5X6 YLW CONV (MISCELLANEOUS) ×4 IMPLANT
PATCHES PATIENT (LABEL) ×6 IMPLANT
SYR 20ML ECCENTRIC (SYRINGE) ×3 IMPLANT
SYR 20ML LL LF (SYRINGE) ×2 IMPLANT
SYR 30ML LL (SYRINGE) ×2 IMPLANT
SYR 50ML SLIP (SYRINGE) ×1 IMPLANT
SYR 5ML LL (SYRINGE) ×2 IMPLANT
SYR TB 1ML LUER SLIP (SYRINGE) IMPLANT
TOWEL GREEN STERILE (TOWEL DISPOSABLE) ×2 IMPLANT
TOWEL GREEN STERILE FF (TOWEL DISPOSABLE) ×2 IMPLANT
TRAP SPECIMEN MUCUS 40CC (MISCELLANEOUS) ×2 IMPLANT
TUBE CONNECTING 20X1/4 (TUBING) ×4 IMPLANT
UNDERPAD 30X36 HEAVY ABSORB (UNDERPADS AND DIAPERS) ×2 IMPLANT
VALVE BIOPSY  SINGLE USE (MISCELLANEOUS) ×2
VALVE BIOPSY SINGLE USE (MISCELLANEOUS) ×1 IMPLANT
VALVE SUCTION BRONCHIO DISP (MISCELLANEOUS) ×2 IMPLANT
WATER STERILE IRR 1000ML POUR (IV SOLUTION) ×2 IMPLANT

## 2020-10-10 NOTE — Interval H&P Note (Signed)
History and Physical Interval Note:  10/10/2020 10:25 AM  Maria Marquez  has presented today for surgery, with the diagnosis of RUL LUNG NODULE.  The various methods of treatment have been discussed with the patient and family. After consideration of risks, benefits and other options for treatment, the patient has consented to  Procedure(s): [place] (N/A) as a surgical intervention.  The patient's history has been reviewed, patient examined, no change in status, stable for surgery.  I have reviewed the patient's chart and labs.  Questions were answered to the patient's satisfaction.     [name]

## 2020-10-10 NOTE — Anesthesia Preprocedure Evaluation (Addendum)
Anesthesia Evaluation  Patient identified by MRN, date of birth, ID band Patient awake    Reviewed: Allergy & Precautions, NPO status , Patient's Chart, lab work & pertinent test results  History of Anesthesia Complications (+) PONV and history of anesthetic complications  Airway Mallampati: III  TM Distance: >3 FB Neck ROM: Full    Dental  (+) Missing   Pulmonary former smoker,    Pulmonary exam normal breath sounds clear to auscultation       Cardiovascular hypertension, Pt. on medications Normal cardiovascular exam Rhythm:Regular Rate:Normal  ECG: NSR, rate 61   Neuro/Psych PSYCHIATRIC DISORDERS Depression negative neurological ROS     GI/Hepatic negative GI ROS, Neg liver ROS,   Endo/Other  Hypothyroidism Morbid obesity  Renal/GU Renal InsufficiencyRenal disease     Musculoskeletal  (+) Arthritis ,   Abdominal (+) + obese,   Peds  Hematology  (+) anemia , HLD   Anesthesia Other Findings RUL LUNG NODULE  Reproductive/Obstetrics                            Anesthesia Physical Anesthesia Plan  ASA: III  Anesthesia Plan: General   Post-op Pain Management:    Induction: Intravenous  PONV Risk Score and Plan: 4 or greater and Ondansetron, Dexamethasone and Treatment may vary due to age or medical condition  Airway Management Planned: Oral ETT  Additional Equipment:   Intra-op Plan:   Post-operative Plan: Extubation in OR  Informed Consent: I have reviewed the patients History and Physical, chart, labs and discussed the procedure including the risks, benefits and alternatives for the proposed anesthesia with the patient or authorized representative who has indicated his/her understanding and acceptance.     Dental advisory given  Plan Discussed with: CRNA  Anesthesia Plan Comments:        Anesthesia Quick Evaluation

## 2020-10-10 NOTE — Transfer of Care (Signed)
Immediate Anesthesia Transfer of Care Note  Patient: Maria Marquez  Procedure(s) Performed: VIDEO BRONCHOSCOPY WITH ENDOBRONCHIAL NAVIGATION (N/A )  Patient Location: PACU  Anesthesia Type:General  Level of Consciousness: awake, alert  and oriented  Airway & Oxygen Therapy: Patient Spontanous Breathing and Patient connected to face mask oxygen  Post-op Assessment: Report given to RN, Post -op Vital signs reviewed and stable and Patient moving all extremities  Post vital signs: Reviewed and stable  Last Vitals:  Vitals Value Taken Time  BP 170/70 10/10/20 1219  Temp    Pulse 73 10/10/20 1224  Resp 16 10/10/20 1224  SpO2 100 % 10/10/20 1224  Vitals shown include unvalidated device data.  Last Pain:  Vitals:   10/10/20 0957  TempSrc:   PainSc: 0-No pain      Patients Stated Pain Goal: 6 (65/03/54 6568)  Complications: No complications documented.

## 2020-10-10 NOTE — Discharge Instructions (Signed)
Do not drive or engage in heavy physical activity for 24 hours.  You may resume normal activities tomorrow.  You may cough up small amounts of blood over the next few days.  You may use acetaminophen (Tylenol) if needed for discomfort. You may use an over the counter cough medication or throat lozenges of your choice if needed.  Call [phone] if you develop chest pain, shortness of breath, fever > 101 F or cough up more than a tablepoon of blood.  My office will contact you with follow up information.

## 2020-10-10 NOTE — Anesthesia Procedure Notes (Signed)
Procedure Name: Intubation Date/Time: 10/10/2020 11:06 AM Performed by: Leonor Liv, CRNA Pre-anesthesia Checklist: Patient identified, Emergency Drugs available, Suction available and Patient being monitored Patient Re-evaluated:Patient Re-evaluated prior to induction Oxygen Delivery Method: Circle System Utilized Preoxygenation: Pre-oxygenation with 100% oxygen Induction Type: IV induction Ventilation: Two handed mask ventilation required and Oral airway inserted - appropriate to patient size Laryngoscope Size: Mac and 4 Grade View: Grade II Tube type: Oral Tube size: 8.5 mm Number of attempts: 1 Airway Equipment and Method: Stylet and Oral airway Placement Confirmation: ETT inserted through vocal cords under direct vision,  positive ETCO2 and breath sounds checked- equal and bilateral Secured at: 22 cm Tube secured with: Tape Dental Injury: Teeth and Oropharynx as per pre-operative assessment  Comments: Inserted by Paulina Fusi, SRNA

## 2020-10-10 NOTE — Brief Op Note (Signed)
10/10/2020  12:33 PM  PATIENT:  [name]  75 y.o. female  PRE-OPERATIVE DIAGNOSIS:  RIGHT UPPER LOBE LUNG NODULE  POST-OPERATIVE DIAGNOSIS:  RIGHT UPPER LOBE LUNG NODULE  PROCEDURE:  Procedure(s): VIDEO BRONCHOSCOPY WITH ENDOBRONCHIAL NAVIGATION (N/A) Needle aspirations, brushings, transbronchial biopsies and bronchoalveolar lavage  SURGEON:  Surgeon(s) and Role:    [name], MD - Primary  PHYSICIAN ASSISTANT:   ASSISTANTS: none   ANESTHESIA:   general  EBL:  minimal  BLOOD ADMINISTERED:none  DRAINS: none   LOCAL MEDICATIONS USED:  NONE  SPECIMEN:  Source of Specimen:  RUL nodule  DISPOSITION OF SPECIMEN:  PATHOLOGY  COUNTS:  NO endo  TOURNIQUET:  * No tourniquets in log *  DICTATION: .Other Dictation: Dictation Number -  PLAN OF CARE: Discharge to home after PACU  PATIENT DISPOSITION:  PACU - hemodynamically stable.   Delay start of Pharmacological VTE agent (>24hrs) due to surgical blood loss or risk of bleeding: not applicable

## 2020-10-10 NOTE — Interval H&P Note (Signed)
History and Physical Interval Note:  10/10/2020 10:16 AM  Maria Marquez  has presented today for surgery, with the diagnosis of RUL LUNG NODULE.  The various methods of treatment have been discussed with the patient and family. After consideration of risks, benefits and other options for treatment, the patient has consented to  Procedure(s): [place] (N/A) as a surgical intervention.  The patient's history has been reviewed, patient examined, no change in status, stable for surgery.  I have reviewed the patient's chart and labs.  Questions were answered to the patient's satisfaction.     [name]

## 2020-10-11 LAB — ACID FAST SMEAR (AFB, MYCOBACTERIA): Acid Fast Smear: NEGATIVE

## 2020-10-11 NOTE — Anesthesia Postprocedure Evaluation (Signed)
Anesthesia Post Note  Patient: Maria Marquez  Procedure(s) Performed: VIDEO BRONCHOSCOPY WITH ENDOBRONCHIAL NAVIGATION (N/A )     Patient location during evaluation: PACU Anesthesia Type: General Level of consciousness: awake Pain management: pain level controlled Vital Signs Assessment: post-procedure vital signs reviewed and stable Respiratory status: spontaneous breathing, nonlabored ventilation, respiratory function stable and patient connected to nasal cannula oxygen Cardiovascular status: blood pressure returned to baseline and stable Postop Assessment: no apparent nausea or vomiting Anesthetic complications: no   No complications documented.  Last Vitals:  Vitals:   10/10/20 1235 10/10/20 1251  BP: 138/65 (!) 119/59  Pulse: 71 68  Resp: 12 11  Temp:  36.6 C  SpO2: 100% 100%    Last Pain:  Vitals:   10/10/20 1251  TempSrc:   PainSc: 0-No pain                 Dayven Linsley P Jozi Malachi

## 2020-10-11 NOTE — Op Note (Signed)
NAME: Maria Marquez, Maria Marquez MEDICAL RECORD UK:[id number] ACCOUNT [id number] DATE OF BIRTH:1946/03/05 FACILITY: [facility] LOCATION: [facility] PHYSICIAN:Thailyn Khalid C. Kenyona Rena, MD  OPERATIVE REPORT  DATE OF PROCEDURE:  10/10/2020  PREOPERATIVE DIAGNOSIS:  Right upper lobe lung nodule.  POSTOPERATIVE DIAGNOSIS:  Right upper lobe lung nodule.  PROCEDURE:  Electromagnetic navigational bronchoscopy with needle aspirations, brushings and transbronchial biopsies and bronchoalveolar lavage.  SURGEON:  Modesto Charon, MD  ASSISTANT:  None.  ANESTHESIA:  General.  FINDINGS:  Good proximity and alignment of the lesion.  Needle aspirations showed atypical cells.  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  She had undergone a CT of the chest, which showed a right upper lobe lung nodule that was markedly hypermetabolic on  PET/CT, and navigational bronchoscopy in the fall was nondiagnostic.  She was treated with chemotherapy for her urothelial cell carcinoma.  On repeat PET, the nodule had decreased in size and density and there was a significant decrease in metabolic  activity as well.  She was advised to go to undergo repeat navigational bronchoscopy for diagnostic purposes.  The indications, risks, benefits, and alternatives were discussed in detail with the patient.  She understood and accepted the risks and agreed  to proceed.  DESCRIPTION OF PROCEDURE:  Maria Marquez was brought to the operating room on 10/10/2020.  Planning for the procedure was done on the console prior to induction.  The lesion had previously been mapped and a new pathway was created and registration was  reperformed which correlated with the previous planning.  She was anesthetized and intubated.  Sequential compression devices were placed on the calves for DVT prophylaxis.  A Bair Hugger was placed for active warming.  A timeout was  performed.  Flexible fiberoptic bronchoscopy was performed via the endotracheal tube.  It revealed normal endobronchial anatomy with no endobronchial lesions to the level of the subsegmental bronchi.  There were moderate clear secretions.   The locatable guide for navigation then was placed and registration was performed.  There was good correlation of the video and virtual bronchoscopy.  The bronchoscope was directed to the right upper lobe orifice and attempts were made to cannulate the  appropriate subsegmental bronchus with the locatable guide, but appropriate angle could not be achieved.  The guide then was pulled back to make the angle of the sheath more acute and the appropriate airway was cannulated and then the locatable guide was  readvanced and the catheter was advanced to within a centimeter of the nodule with good alignment.  Sampling then was performed.  All sampling was done with fluoroscopy.  Three needle aspirations were performed followed by 3 passes with a needle brush.   Two additional passes were made with a triple brush.  While those samples were being evaluated, multiple biopsies were obtained.  A few biopsies were sent for AFB and fungal cultures, but the majority was sent for permanent pathology.  The needle  aspiration showed atypical cells.  The initial brushings showed mostly benign bronchial cells.  Additional needle aspirations were performed and then additional passes were made with a new needle brush and again some atypical cells were seen, but no  definitive diagnosis could be given.  Multiple additional biopsies were obtained and bronchoalveolar lavage was performed with instillation of 100 mL of saline.  There was return of approximately 20 mL.  This will be sent for cytology.  Final inspection  was made  with the bronchoscope.  There was no ongoing bleeding.  The patient was extubated in the operating room and taken to the [facility] in good condition.  The  total fluoroscopy time was 5.1 minutes and the dose was 90 mGy.  IN/NUANCE  D:10/10/2020 T:10/11/2020 JOB:[id number]

## 2020-10-12 ENCOUNTER — Encounter (HOSPITAL_COMMUNITY): Payer: Self-pay | Admitting: Thoracic Surgery (Cardiothoracic Vascular Surgery)

## 2020-10-13 LAB — CYTOLOGY - NON PAP

## 2020-10-13 LAB — SURGICAL PATHOLOGY

## 2020-10-14 LAB — FUNGUS CULTURE WITH STAIN

## 2020-10-15 LAB — AEROBIC/ANAEROBIC CULTURE W GRAM STAIN (SURGICAL/DEEP WOUND)
Culture: NO GROWTH
Gram Stain: NONE SEEN

## 2020-10-16 ENCOUNTER — Other Ambulatory Visit: Payer: Self-pay

## 2020-10-16 ENCOUNTER — Encounter: Payer: Self-pay | Admitting: Thoracic Surgery (Cardiothoracic Vascular Surgery)

## 2020-10-16 ENCOUNTER — Ambulatory Visit (INDEPENDENT_AMBULATORY_CARE_PROVIDER_SITE_OTHER): Payer: Medicare HMO | Admitting: Thoracic Surgery (Cardiothoracic Vascular Surgery)

## 2020-10-16 VITALS — BP 148/71 | HR 80 | Resp 20 | Ht 66.0 in | Wt 247.0 lb

## 2020-10-16 DIAGNOSIS — R911 Solitary pulmonary nodule: Secondary | ICD-10-CM | POA: Diagnosis not present

## 2020-10-16 DIAGNOSIS — Z9889 Other specified postprocedural states: Secondary | ICD-10-CM | POA: Diagnosis not present

## 2020-10-16 NOTE — Progress Notes (Signed)
[address]       [place], [address]             [phone]    HPI: Maria Marquez returns to discuss the results of her navigational bronchoscopy  Maria Marquez is a 75 year old woman with a history of tobacco abuse, hypertension, hyperlipidemia, reflux, hypothyroidism, arthritis, stage III chronic kidney disease, and urothelial carcinoma of the right ureter.  She had smoked about 1/2 pack of cigarettes a day for 50 years prior to quitting (25 to 30 pack years).  She was diagnosed with a urothelial carcinoma in the fall 2021.  A CT of the chest was done as part of her staging.  It showed a 1.1 x 1.3 cm right upper lobe lung nodule PET/CT showed it was hypermetabolic with an SUV of 11.  Navigational bronchoscopy was nondiagnostic.  She was treated with 4 cycles of gemcitabine and 3 cycles of cisplatin.  She completed therapy in December 2021.  On follow-up with Dr. Alen Marquez she had a repeat PET CT which showed decrease in size and density of the right upper lobe nodule and a significant decrease in metabolic activity as well.  I did a navigational bronchoscopy on 10/07/2020.  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  She tolerated the procedure well.  She did have some coughing but no hemoptysis.  She had a sore throat for a few days as well.  Zubrod Score: At the time of surgery this patient's most appropriate activity status/level should be described as: []     0    Normal activity, no symptoms [x]     1    Restricted in physical strenuous activity but ambulatory, able to do out light work []     2    Ambulatory and capable of self care, unable to do work activities, up and about >50 % of waking hours                              []     3    Only limited self care, in bed greater than 50% of waking hours []     4    Completely disabled, no self care, confined to bed or chair []     5    Moribund  Current Outpatient Medications  Medication Sig Dispense  Refill  . acetaminophen (TYLENOL) 500 MG tablet Take 500-1,500 mg by mouth every 8 (eight) hours as needed for moderate pain.    [name] albuterol (VENTOLIN HFA) 108 (90 Base) MCG/ACT inhaler Inhale 1-2 puffs into the lungs every 6 (six) hours as needed for wheezing or shortness of breath. 18 g 0  . calcium carbonate (TUMS - DOSED IN MG ELEMENTAL CALCIUM) 500 MG chewable tablet Chew 500 mg by mouth daily as needed for indigestion or heartburn.    [name] EPINEPHrine 0.3 mg/0.3 mL IJ SOAJ injection Inject 0.3 mg into the muscle as needed for anaphylaxis. 1 each 2  . estradiol-norethindrone (ACTIVELLA) 1-0.5 MG tablet Take 1 tablet by mouth daily. 28 tablet 0  . ezetimibe (ZETIA) 10 MG tablet TAKE 1 TABLET EVERY DAY (Patient taking differently: Take 10 mg by mouth daily.) 90 tablet 3  . levothyroxine (SYNTHROID) 125 MCG tablet Take 1 tablet (125 mcg total) by mouth at bedtime. 90 tablet 1  . lidocaine-prilocaine (EMLA) cream Apply 1 application topically as needed. (Patient taking differently: Apply 1 application topically as needed (port access).)  30 g 0  . losartan-hydrochlorothiazide (HYZAAR) 50-12.5 MG tablet TAKE 1 TABLET EVERY DAY (Patient taking differently: Take 1 tablet by mouth daily.) 90 tablet 3  . simvastatin (ZOCOR) 10 MG tablet TAKE 1 TABLET (10 MG TOTAL) BY MOUTH 2 (TWO) TIMES A WEEK. (Patient taking differently: Take 10 mg by mouth 2 (two) times a week. Saturday and Sunday) 26 tablet 3  . XIIDRA 5 % SOLN Place 1 drop into both eyes 2 (two) times daily as needed (dry eyes).     No current facility-administered medications for this visit.    Physical Exam BP (!) 148/71   Pulse 80   Resp 20   Ht 5\' 6"  (1.676 m)   Wt 247 lb (112 kg)   SpO2 97% Comment: RA  BMI 39.87 kg/m  Obese 75 year old woman in no acute distress Alert and oriented x3 with no focal deficits Lungs are clear but diminished bilaterally Cardiac regular rate and rhythm No peripheral edema  Diagnostic Tests: A. LUNG,  RIGHT UPPER LOBE, BIOPSY:  - Adenocarcinoma, see comment   NUCLEAR MEDICINE PET SKULL BASE TO THIGH  TECHNIQUE: 12.7 mCi F-18 FDG was injected intravenously. Full-ring PET imaging was performed from the skull base to thigh after the radiotracer. CT data was obtained and used for attenuation correction and anatomic localization.  Fasting blood glucose: 120 mg/dl  COMPARISON:  PET-CT 10 1121, super D chest 06/04/2020  FINDINGS: Mediastinal blood pool activity: SUV max 3.23  Liver activity: SUV max NA  NECK: No hypermetabolic lymph nodes in the neck.  Incidental CT findings: none  CHEST: RIGHT upper lobe pulmonary nodule of concern is decreased in central density compared to prior. Previously the soft tissues central component measures 16 x 12 mm on PET CT 06/02/2020. Nowthe a solid component is less well-defined centrally measuring approximately 10 mm x 11 mm. (Image 60/4).  Additionally, metabolic activity of this RIGHT upper lobe nodule is significantly decreased with SUV max equal not 2.9 decreased from SUV max equal 11.1.  Resolution of hypermetabolic paratracheal lymph node. No enlarged mediastinal lymph nodes are present.  No new pulmonary nodularity.  Incidental CT findings: none  ABDOMEN/PELVIS: No evidence of malignancy within the pelvis. No hypermetabolic pelvic lymph nodes.  No evidence of liver metastasis or periaortic adenopathy.  Incidental CT findings: none  SKELETON: No aggressive osseous lesion.  Incidental CT findings: none  IMPRESSION: 1. Interval decrease in volume and metabolic activity of the RIGHT upper lobe pulmonary nodule. 2. No residual hypermetabolic mediastinal lymph nodes. 3. No evidence of malignancy in the abdomen pelvis.   Electronically Signed   By: Suzy Bouchard M.D.   On: 09/19/2020 11:18 I personally reviewed the PET/CT images and concur with the findings noted above.  Pulmonary function  testing FVC 3.00 (94%) FEV1 2.32 (96%) DLCO 15.94 (77%)  Impression: Maria Marquez is a 75 year old woman with a history of tobacco abuse, hypertension, hyperlipidemia, reflux, hypothyroidism, arthritis, stage III chronic kidney disease, urothelial carcinoma of the right ureter, and a right upper lobe lung nodule.    Her right upper lobe nodule was discovered on a CT of the chest as part of the metastatic work-up for urothelial cell carcinoma.  Initial attempts to biopsy were unsuccessful.  She went ahead with chemotherapy for her urothelial cell carcinoma with cisplatin and gemcitabine.  On follow-up CT there was a partial response of the lung nodule.  Navigational bronchoscopy and biopsy showed adenocarcinoma.  This was consistent with a lung primary.  Clinically she  has stage Ia disease (T1, N0).  We discussed the treatment options of surgical resection versus stereotactic radiation.  We discussed the relative advantages and disadvantages to each approach.  I did encourage her to get input from Dr. Alen Marquez and also offered to arrange a consultation with radiation oncology if she wished to speak with them before making a decision.  I discussed the general nature of the proposed surgical resection with Mrs. Canino and her daughter.  The plan would be to do a robotic assisted right upper lobectomy and node dissection.  I informed them of the general nature of the procedure including the need for general anesthesia, the incisions to be used, the use of drains to postoperatively, the expected hospital stay, and the overall recovery.  I informed them of the indications, risks, benefits, and alternatives.  They understand the risks include, but not limited to death, MI, DVT, PE, bleeding, possible need for transfusion, infection, prolonged air leak, cardiac arrhythmias, as well as possibility of other unforeseeable complications.  She had questions about risk in the standpoint of her chronic kidney  disease.  Her creatinine is 1.7 with a GFR of 31.  She is at risk for perioperative renal complications but would be unlikely to develop renal failure outside of other complications.  She wishes to have some time to think this over.  She would like to talk to Dr. Alen Marquez and also meet with the radiation oncologist before making a decision.  She will let us know if she would like to proceed with surgery.  Plan: Referral to radiation oncology Talk with Dr. Alen Marquez She will call if she would like to proceed with robotic right VATS for right upper lobectomy.  [name], MD [facility] [phone]

## 2020-10-20 ENCOUNTER — Ambulatory Visit
Admission: RE | Admit: 2020-10-20 | Discharge: 2020-10-20 | Disposition: A | Discharge status: Home / Self Care | Payer: Medicare HMO | Source: Ambulatory Visit | Attending: Radiation Oncology | Admitting: Radiation Oncology

## 2020-10-20 ENCOUNTER — Encounter: Payer: Self-pay | Admitting: Radiation Oncology

## 2020-10-20 VITALS — BP 142/62 | HR 67 | Temp 97.9°F | Resp 20 | Ht 66.0 in | Wt 247.8 lb

## 2020-10-20 DIAGNOSIS — Z79899 Other long term (current) drug therapy: Secondary | ICD-10-CM | POA: Insufficient documentation

## 2020-10-20 DIAGNOSIS — C3411 Malignant neoplasm of upper lobe, right bronchus or lung: Secondary | ICD-10-CM | POA: Diagnosis not present

## 2020-10-20 DIAGNOSIS — N1831 Chronic kidney disease, stage 3a: Secondary | ICD-10-CM | POA: Insufficient documentation

## 2020-10-20 DIAGNOSIS — M129 Arthropathy, unspecified: Secondary | ICD-10-CM | POA: Insufficient documentation

## 2020-10-20 DIAGNOSIS — Z801 Family history of malignant neoplasm of trachea, bronchus and lung: Secondary | ICD-10-CM | POA: Diagnosis not present

## 2020-10-20 DIAGNOSIS — C659 Malignant neoplasm of unspecified renal pelvis: Secondary | ICD-10-CM

## 2020-10-20 DIAGNOSIS — Z800 Family history of malignant neoplasm of digestive organs: Secondary | ICD-10-CM | POA: Diagnosis not present

## 2020-10-20 DIAGNOSIS — K5900 Constipation, unspecified: Secondary | ICD-10-CM | POA: Insufficient documentation

## 2020-10-20 DIAGNOSIS — Z85828 Personal history of other malignant neoplasm of skin: Secondary | ICD-10-CM | POA: Insufficient documentation

## 2020-10-20 DIAGNOSIS — I129 Hypertensive chronic kidney disease with stage 1 through stage 4 chronic kidney disease, or unspecified chronic kidney disease: Secondary | ICD-10-CM | POA: Diagnosis not present

## 2020-10-20 DIAGNOSIS — Z87891 Personal history of nicotine dependence: Secondary | ICD-10-CM | POA: Insufficient documentation

## 2020-10-20 DIAGNOSIS — Z86010 Personal history of colonic polyps: Secondary | ICD-10-CM | POA: Insufficient documentation

## 2020-10-20 DIAGNOSIS — C651 Malignant neoplasm of right renal pelvis: Secondary | ICD-10-CM | POA: Insufficient documentation

## 2020-10-20 DIAGNOSIS — R911 Solitary pulmonary nodule: Secondary | ICD-10-CM

## 2020-10-20 DIAGNOSIS — E039 Hypothyroidism, unspecified: Secondary | ICD-10-CM | POA: Insufficient documentation

## 2020-10-20 NOTE — Progress Notes (Signed)
Thoracic Location of Tumor / Histology: Right lung (second primary)  The patient presented to [facility] [place], on 03/10/2020 with complaints of back pain, urinary frequency, and hematuria.  Biopsies of lung 11/73/5670   (if applicable) revealed:    Biopsies of right ureter 04/18/2020 revealed     Tobacco/Marijuana/Snuff/ETOH use: no to all  Past/Anticipated interventions by cardiothoracic surgery, if any: Cytoscopy with right ureteroscopy and biopsy of right renal pelvis mass. Video bronchoscopy by Dr Lamonte Sakai was unsuccessful Repeat bronchoscopy by Dr Roxan Hockey 10/10/2020.  Past/Anticipated interventions by medical oncology, if any: 4 cycles of Gemcitabine and Cisplatin from 06/13/2020 to December 2021.  Signs/Symptoms  Weight changes, if any: no weight loss but does report increase in weight  Respiratory complaints, if any: no coughing/ reports some shortness of breath Only with Anxiety.  Hemoptysis, if any: no  Pain issues, if any:  Yes to forearms and bilateral heels.  She contributes this pain as chemo related.  SAFETY ISSUES:  Prior radiation? no  Pacemaker/ICD? no   Possible current pregnancy? no  Is the patient on methotrexate? no  Current Complaints / other details:  Patient is retired.  Daughter Linus Orn present at visit with her.  Patient lives alone.   She has a son and daughter as support.  Vitals:   10/20/20 1333  BP: (!) 142/62  Pulse: 67  Resp: 20  Temp: 97.9 F (36.6 C)  SpO2: 100%  Weight: 247 lb 12.8 oz (112.4 kg)  Height: 5\' 6"  (1.676 m)

## 2020-10-20 NOTE — Progress Notes (Signed)
Radiation Oncology         [phone] ________________________________  Initial Outpatient Consultation  Name: Maria Marquez MRN: [id number]  Date: 10/20/2020  DOB: 1945-10-21  GE:[id number] Maria Beals, MD  [name], *   REFERRING PHYSICIAN: [name], *  DIAGNOSIS: The primary encounter diagnosis was Pulmonary nodule. Diagnoses of Cancer of renal pelvis, unspecified laterality ([place]) and Primary cancer of right upper lobe of lung ([place]) were also pertinent to this visit.  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 female who is seen as a courtesy of Dr. Roxan Hockey for an opinion concerning radiation therapy as part of management for her recently diagnosed lung cancer cancer. Today, she is accompanied by daughter on evaluation today. The patient presented to [facility], [place], on 03/10/2020 with complaints of back pain, urinary frequency, and hematuria. She was found to have a right renal pelvis mass with papillary features concerning for urothelial carcinoma and underwent a cytoscopy with right ureteroscopy and biopsy of right renal pelvis mass on 04/18/2020 under the care of Dr. Lovena Neighbours. Cytology from the procedure revealed high-grade urothelial carcinoma.  The patient underwent a PET scan on 06/02/2020 that showed a right upper lobe lesion with spiculated margins that was suspicious for primary pulmonary neoplasm. There was mildly increased metabolic activity associated with right paratracheal lymph nodes, through not significantly above mediastinal blood pool. Additionally, there was asymmetry of the base of the tongue on the left, as compared to the right, with mild increased asymmetric FDG uptake. However, there was no adenopathy in the neck.  Chest CT scan on 06/04/2020 showed no significant change in  the size of the part-solid nodule within the right upper lobe. 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  The patient underwent a video bronchoscopy with electromagnetic navigation on 06/06/2020 under the care of Dr. Lamonte Sakai. Cytology from the procedure did not reveal any malignant cells in the right upper lobe brushing or fine needle aspiration.  The patient underwent four cycles of chemotherapy with Gemcitabine and Cisplatin (Cisplatin was held on the fourth cycle) from 06/13/2020 to December of 2021.  PET scan on 09/19/2020 showed interval decrease in volume and metabolic activity of the right upper lobe pulmonary nodule. There were no residual hypermetabolic mediastinal lymph nodes, nor was there any evidence of malignancy in the abdomen or pelvis.  The patient underwent an electromagnetic navigational bronchoscopy with needle aspirations, brushings and transbronchial biopsies, and bronchoalveolar lavage on 10/10/2020 under the care of Dr. Roxan Hockey. Pathology from the procedure revealed adenocarcinoma of the right upper lobe. Cytology from the procedure revealed malignant cells consistent with adenocarcinoma in the right upper lobe brushing and fine needle aspiration.   The patient was last seen by Dr. Roxan Hockey on 10/16/2020, at which time they discussed treatment options of surgical resection versus stereotactic radiation. The patient wanted some time to think over her options and speak with radiation oncology prior to making a decision.  PREVIOUS RADIATION THERAPY: No  PAST MEDICAL HISTORY:  Past Medical History:  Diagnosis Date  . Acute blood loss anemia   . Arthritis   . Back pain   . Bilious vomiting with nausea   . 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 Chronic kidney disease, stage 3a ([place]) 12/31/2019  . Complication of anesthesia   . Constipation   .  Dyspnea    On exertion and rest at times  . Gastroesophageal  reflux disease without esophagitis 06/17/2015  . GERD (gastroesophageal reflux disease)   . HLD (hyperlipidemia)   . Hypertension   . Hypothyroid   . Joint pain   . Leg edema   . Leukocytosis   . Multiple food allergies   . Myalgia due to statin 11/23/2018  . Osteoarthritis of hip    Right  . Pneumonia   . PONV (postoperative nausea and vomiting)   . Postmenopausal hormone replacement therapy 06/17/2015  . Pre-diabetes   . Prediabetes   . Sessile colonic polyp 10/04/2017   Colonoscopy 12/2015, Dr. Fuller Plan, repeat q 5 years.    PAST SURGICAL HISTORY: Past Surgical History:  Procedure Laterality Date  . CYSTOSCOPY WITH URETEROSCOPY Right 04/18/2020   Procedure: CYSTOSCOPY WITH URETEROSCOPY, BIOPSY;  Surgeon: Ceasar Mons, MD;  Location: [facility];  Service: Urology;  Laterality: Right;  ONLY NEEDS 45 MIN  . EYE SURGERY  1987  . HERNIA REPAIR    . IR IMAGING GUIDED PORT INSERTION  06/04/2020  . THYROID LOBECTOMY     right side  . TOTAL HIP ARTHROPLASTY Right 01/28/2016   Procedure: RIGHT TOTAL HIP ARTHROPLASTY ANTERIOR APPROACH;  Surgeon: [name], MD;  Location: [facility];  Service: Orthopedics;  Laterality: Right;  [name] VIDEO BRONCHOSCOPY WITH ENDOBRONCHIAL NAVIGATION N/A 06/06/2020   Procedure: VIDEO BRONCHOSCOPY WITH ENDOBRONCHIAL NAVIGATION;  Surgeon: Collene Gobble, MD;  Location: [facility];  Service: Thoracic;  Laterality: N/A;  . VIDEO BRONCHOSCOPY WITH ENDOBRONCHIAL NAVIGATION N/A 10/10/2020   Procedure: VIDEO BRONCHOSCOPY WITH ENDOBRONCHIAL NAVIGATION;  Surgeon: [name], MD;  Location: [facility];  Service: Thoracic;  Laterality: N/A;    FAMILY HISTORY:  Family History  Problem Relation Age of Onset  . Arthritis Mother   . Breast cancer Mother   . Schizophrenia Mother   . Diabetes Father   . Heart disease Father   . Kidney disease Father   . Stroke Sister   . Lung cancer Brother   . Heart disease Brother   . Lung cancer Brother   . Heart disease Brother    . Vision loss Brother        Glaucoma  . Alcohol abuse Brother     SOCIAL HISTORY:  Social History   Tobacco Use  . Smoking status: Former Smoker    Packs/day: 1.00    Years: 45.00    Pack years: 45.00    Types: Cigarettes    Quit date: 04/18/2020    Years since quitting: 0.5  . Smokeless tobacco: Former Systems developer  . Tobacco comment: Pt quit smoking 3 weeks ago.   Vaping Use  . Vaping Use: Never used  Substance Use Topics  . Alcohol use: Not Currently    Comment: rarely  . Drug use: No    ALLERGIES:  Allergies  Allergen Reactions  . Iodine Shortness Of Breath  . Keflex [Cephalexin] Shortness Of Breath, Rash and Tinitus  . Shellfish Allergy Hives  . Lasix [Furosemide] Itching  . Rosuvastatin Other (See Comments)    Muscle Pain in Thighs  . Latex Rash  . Lisinopril Cough  . Sulfa Antibiotics Nausea Only  . Tramadol Itching    MEDICATIONS:  Current Outpatient Medications  Medication Sig Dispense Refill  . acetaminophen (TYLENOL) 500 MG tablet Take 500-1,500 mg by mouth every 8 (eight) hours as needed for moderate pain.    [name] albuterol (VENTOLIN HFA) 108 (90 Base) MCG/ACT inhaler  Inhale 1-2 puffs into the lungs every 6 (six) hours as needed for wheezing or shortness of breath. 18 g 0  . calcium carbonate (TUMS - DOSED IN MG ELEMENTAL CALCIUM) 500 MG chewable tablet Chew 500 mg by mouth daily as needed for indigestion or heartburn.    [name] EPINEPHrine 0.3 mg/0.3 mL IJ SOAJ injection Inject 0.3 mg into the muscle as needed for anaphylaxis. 1 each 2  . estradiol-norethindrone (ACTIVELLA) 1-0.5 MG tablet Take 1 tablet by mouth daily. 28 tablet 0  . ezetimibe (ZETIA) 10 MG tablet TAKE 1 TABLET EVERY DAY (Patient taking differently: Take 10 mg by mouth daily.) 90 tablet 3  . levothyroxine (SYNTHROID) 125 MCG tablet Take 1 tablet (125 mcg total) by mouth at bedtime. 90 tablet 1  . lidocaine-prilocaine (EMLA) cream Apply 1 application topically as needed. (Patient taking differently:  Apply 1 application topically as needed (port access).) 30 g 0  . losartan-hydrochlorothiazide (HYZAAR) 50-12.5 MG tablet TAKE 1 TABLET EVERY DAY (Patient taking differently: Take 1 tablet by mouth daily.) 90 tablet 3  . simvastatin (ZOCOR) 10 MG tablet TAKE 1 TABLET (10 MG TOTAL) BY MOUTH 2 (TWO) TIMES A WEEK. (Patient taking differently: Take 10 mg by mouth 2 (two) times a week. Saturday and Sunday) 26 tablet 3  . XIIDRA 5 % SOLN Place 1 drop into both eyes 2 (two) times daily as needed (dry eyes).     No current facility-administered medications for this encounter.    REVIEW OF SYSTEMS:  A 10+ POINT REVIEW OF SYSTEMS WAS OBTAINED including neurology, dermatology, psychiatry, cardiac, respiratory, lymph, extremities, GI, GU, musculoskeletal, constitutional, reproductive, HEENT.  She denies any breathing issues significant cough or hemoptysis.  She reports smoking proximately half a pack of cigarettes for many years.  She denies any recent hematuria or problems such as flank pain.   PHYSICAL EXAM:  height is _0  (1.676 m) and weight is 247 lb 12.8 oz (112.4 kg). Her temperature is 97.9 F (36.6 C). Her blood pressure is 142/62 (abnormal) and her pulse is 67. Her respiration is 20 and oxygen saturation is 100%.   General: Alert and oriented, in no acute distress HEENT: Head is normocephalic. Extraocular movements are intact. Oropharynx is clear. Neck: Neck is supple, no palpable cervical or supraclavicular lymphadenopathy. Heart: Regular in rate and rhythm with no murmurs, rubs, or gallops. Chest: Clear to auscultation bilaterally, with no rhonchi, wheezes, or rales. Abdomen: Soft, nontender, nondistended, with no rigidity or guarding. Extremities: No cyanosis or edema. Lymphatics: see Neck Exam Skin: No concerning lesions. Musculoskeletal: symmetric strength and muscle tone throughout. Neurologic: Cranial nerves II through XII are grossly intact. No obvious focalities. Speech is fluent.  Coordination is intact. Psychiatric: Judgment and insight are intact. Affect is appropriate.   ECOG = 1  0 - Asymptomatic (Fully active, able to carry on all predisease activities without restriction)  1 - Symptomatic but completely ambulatory (Restricted in physically strenuous activity but ambulatory and able to carry out work of a light or sedentary nature. For example, light housework, office work)  2 - Symptomatic, <50% in bed during the day (Ambulatory and capable of all self care but unable to carry out any work activities. Up and about more than 50% of waking hours)  3 - Symptomatic, >50% in bed, but not bedbound (Capable of only limited self-care, confined to bed or chair 50% or more of waking hours)  4 - Bedbound (Completely disabled. Cannot carry on any self-care. Totally confined to  bed or chair)  5 - Death   Eustace Pen MM, Creech RH, Tormey DC, et al. [phone]). "Toxicity and response criteria of the [facility]". [place] Oncol. 5 (6): 649-55  LABORATORY DATA:  Lab Results  Component Value Date   WBC 7.1 10/09/2020   HGB 11.2 (L) 10/09/2020   HCT 34.3 (L) 10/09/2020   MCV 101.2 (H) 10/09/2020   PLT 242 10/09/2020   NEUTROABS 4.5 09/19/2020   Lab Results  Component Value Date   NA 139 10/09/2020   K 3.8 10/09/2020   CL 105 10/09/2020   CO2 26 10/09/2020   GLUCOSE 110 (H) 10/09/2020   CREATININE 1.71 (H) 10/09/2020   CALCIUM 9.2 10/09/2020      RADIOGRAPHY: DG Chest 2 View  Result Date: 10/09/2020 CLINICAL DATA:  Preop EXAM: CHEST - 2 VIEW COMPARISON:  06/06/2020 FINDINGS: Right port with tip at the SVC. There is no edema, consolidation, effusion, or pneumothorax. Subtle right suprahilar nodule by CT. Clips over the thoracic inlet. IMPRESSION: No acute or interval finding. Electronically Signed   By: Monte Fantasia M.D.   On: 10/09/2020 10:27   DG Chest Port 1 View  Result Date: 10/10/2020 CLINICAL DATA:  Status post bronchoscopy EXAM:  PORTABLE CHEST 1 VIEW COMPARISON:  10/09/2020 FINDINGS: The heart size and mediastinal contours are within normal limits. Partially imaged right chest port catheter. Both lungs are clear. The visualized skeletal structures are unremarkable. IMPRESSION: No acute abnormality of the lungs in AP portable projection. No pneumothorax. Electronically Signed   By: Eddie Candle M.D.   On: 10/10/2020 13:01   DG C-ARM BRONCHOSCOPY  Result Date: 10/10/2020 C-ARM BRONCHOSCOPY: Fluoroscopy was utilized by the requesting physician.  No radiographic interpretation.      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 Roxan Hockey did confirm that she has 2 separate primaries with adenocarcinoma recovered from her navigational bronchoscopy procedure from the right upper lobe lesion.  The patient would be a good candidate for definitive course of her clinical stage I non-small cell lung cancer with stereotactic body radiation therapy.  She will also be a candidate for surgical removal of this lesion for cure.  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  I discussed with the patient that I would consider this her most serious malignancy and recommended she discuss prognosis with this disease with Dr. Alen Blew for which she will meet with later this week.  She may require additional therapy concerning this malignancy depending on evaluation by Dr. Lovena Neighbours in the next few weeks.  If she were to have surgery for her lung lesion,  this may potentially delay initiation of additional therapy for management of this problem.  I discussed with the patient that her best chances for cure for her lung lesion would be surgical intervention but given the findings of  a serious second malignancy I am not so sure this would be the best choice for her situation.  The patient is unsure which approach she will take to address her early stage lung cancer.  She will meet with Dr. Alen Blew later this week for further input concerning treatment management decisions.    PLAN: Final treatment plan management of her clinical stage I non-small cell lung cancers pending input from medical oncology later  this week.  I have asked patient to call me if she should decide on pursuing stereotactic body radiation therapy for management of her early stage lung lesion.  Total time spent in this encounter was 65 minutes which included reviewing the patient's most recent consultations, follow-ups, CT scans, PET scans, chemotherapy, bronchoscopies, pathology/cytology reports, physical examination, and documentation.  ------------------------------------------------  Blair Promise, PhD, MD  This document serves as a record of services personally performed by Gery Pray, MD. It was created on his behalf by Clerance Lav, a trained medical scribe. The creation of this record is based on the scribe's personal observations and the provider's statements to them. This document has been checked and approved by the attending provider.

## 2020-10-20 NOTE — Progress Notes (Signed)
See md visit for nursing evaluation.

## 2020-10-21 DIAGNOSIS — C3411 Malignant neoplasm of upper lobe, right bronchus or lung: Secondary | ICD-10-CM | POA: Insufficient documentation

## 2020-10-22 ENCOUNTER — Inpatient Hospital Stay: Payer: Medicare HMO | Attending: Oncology | Admitting: Oncology

## 2020-10-22 ENCOUNTER — Telehealth: Payer: Self-pay | Admitting: Emergency Medicine

## 2020-10-22 DIAGNOSIS — C3411 Malignant neoplasm of upper lobe, right bronchus or lung: Secondary | ICD-10-CM | POA: Insufficient documentation

## 2020-10-22 DIAGNOSIS — C659 Malignant neoplasm of unspecified renal pelvis: Secondary | ICD-10-CM

## 2020-10-22 DIAGNOSIS — D6481 Anemia due to antineoplastic chemotherapy: Secondary | ICD-10-CM | POA: Insufficient documentation

## 2020-10-22 DIAGNOSIS — T451X5A Adverse effect of antineoplastic and immunosuppressive drugs, initial encounter: Secondary | ICD-10-CM | POA: Insufficient documentation

## 2020-10-22 DIAGNOSIS — C651 Malignant neoplasm of right renal pelvis: Secondary | ICD-10-CM | POA: Insufficient documentation

## 2020-10-22 NOTE — Progress Notes (Signed)
Hematology and Oncology Follow Up for Telemedicine Visits  Debora Stockdale [id number] 13-Jan-1946 75 y.o. 10/22/2020 11:25 AM [name], Rayford Halsted, MDHernandez Lucilla Edin*   I connected with@ on 10/22/20 at 11:45 AM EST by video enabled telemedicine visit and verified that I am speaking with the correct person using two identifiers.   I discussed the limitations, risks, security and privacy concerns of performing an evaluation and management service by telemedicine and the availability of in-person appointments. I also discussed with the patient that there may be a patient responsible charge related to this service. The patient expressed understanding and agreed to proceed.  Other persons participating in the visit and their role in the encounter:    Patient's location: Home Provider's location: Office    Principle Diagnosis: 75 year old woman with :  1. 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    2.  T1N0 adenocarcinoma of the right upper lobe of the lung.   Secondary diagnosis: Right lung mass measuring 1.3 x 1.1 cm in the suprahilar region diagnosed in August 2021.  Prior Therapy:   She is status post cystoscopy and urethroscopy and a biopsy of her renal pelvic mass completed by Dr. Lovena Neighbours in April 18, 2020.  This showed high-grade urothelial carcinoma.  She is status post bronchoscopy and a biopsy of her right upper lung mass which was not diagnostic.  Gemcitabine and cisplatin chemotherapy started on June 13, 2020.  She completed 3 cycles and cycle 4 was given without cisplatin completed in December 2021.  She is status post bronchoscopy and a biopsy completed by Dr. Roxan Hockey on October 10, 2020 which confirmed the presence of primary lung neoplasm.  Current therapy: Under consideration for additional therapy.  Interim History: Ms. Thieme underwent evaluation by Dr. Roxan Hockey and Dr. Sofie Hartigan since the  last visit.  She underwent a bronchoscopy and a biopsy confirmed the presence of adenocarcinoma right upper lung lobe.  She was also given the option of surgery versus radiation after discussion with Dr. Sofie Hartigan.  Clinically, he reports feeling reasonably well without any major complaints at this time.  .    Medications: I have reviewed the patient's current medications.  Current Outpatient Medications  Medication Sig Dispense Refill  . acetaminophen (TYLENOL) 500 MG tablet Take 500-1,500 mg by mouth every 8 (eight) hours as needed for moderate pain.    [name] albuterol (VENTOLIN HFA) 108 (90 Base) MCG/ACT inhaler Inhale 1-2 puffs into the lungs every 6 (six) hours as needed for wheezing or shortness of breath. 18 g 0  . calcium carbonate (TUMS - DOSED IN MG ELEMENTAL CALCIUM) 500 MG chewable tablet Chew 500 mg by mouth daily as needed for indigestion or heartburn.    [name] EPINEPHrine 0.3 mg/0.3 mL IJ SOAJ injection Inject 0.3 mg into the muscle as needed for anaphylaxis. 1 each 2  . estradiol-norethindrone (ACTIVELLA) 1-0.5 MG tablet Take 1 tablet by mouth daily. 28 tablet 0  . ezetimibe (ZETIA) 10 MG tablet TAKE 1 TABLET EVERY DAY (Patient taking differently: Take 10 mg by mouth daily.) 90 tablet 3  . levothyroxine (SYNTHROID) 125 MCG tablet Take 1 tablet (125 mcg total) by mouth at bedtime. 90 tablet 1  . lidocaine-prilocaine (EMLA) cream Apply 1 application topically as needed. (Patient taking differently: Apply 1 application topically as needed (port access).) 30 g 0  . losartan-hydrochlorothiazide (HYZAAR) 50-12.5 MG tablet TAKE 1 TABLET EVERY DAY (Patient taking differently: Take 1 tablet by mouth daily.) 90 tablet 3  .  simvastatin (ZOCOR) 10 MG tablet TAKE 1 TABLET (10 MG TOTAL) BY MOUTH 2 (TWO) TIMES A WEEK. (Patient taking differently: Take 10 mg by mouth 2 (two) times a week. Saturday and Sunday) 26 tablet 3  . XIIDRA 5 % SOLN Place 1 drop into both eyes 2 (two) times daily as needed (dry eyes).      No current facility-administered medications for this visit.     Allergies:  Allergies  Allergen Reactions  . Iodine Shortness Of Breath  . Keflex [Cephalexin] Shortness Of Breath, Rash and Tinitus  . Shellfish Allergy Hives  . Lasix [Furosemide] Itching  . Rosuvastatin Other (See Comments)    Muscle Pain in Thighs  . Latex Rash  . Lisinopril Cough  . Sulfa Antibiotics Nausea Only  . Tramadol Itching       Lab Results: Lab Results  Component Value Date   WBC 7.1 10/09/2020   HGB 11.2 (L) 10/09/2020   HCT 34.3 (L) 10/09/2020   MCV 101.2 (H) 10/09/2020   PLT 242 10/09/2020     Chemistry      Component Value Date/Time   NA 139 10/09/2020 0948   NA 140 12/19/2019 1132   K 3.8 10/09/2020 0948   CL 105 10/09/2020 0948   CO2 26 10/09/2020 0948   BUN 32 (H) 10/09/2020 0948   BUN 25 12/19/2019 1132   CREATININE 1.71 (H) 10/09/2020 0948   CREATININE 1.42 (H) 09/19/2020 0805   GLU 92 02/04/2016 0000      Component Value Date/Time   CALCIUM 9.2 10/09/2020 0948   ALKPHOS 55 10/09/2020 0948   AST 19 10/09/2020 0948   AST 16 09/19/2020 0805   ALT 19 10/09/2020 0948   ALT 15 09/19/2020 0805   BILITOT 0.9 10/09/2020 0948   BILITOT 0.8 09/19/2020 0805        Impression and Plan:   75 year old woman with  1.    Urothelial carcinoma diagnosed in August 2021.  She was found to have T2N0 of the right renal pelvis.   She has had completed neoadjuvant chemotherapy without any evidence of disease outside of her GU tract.  She has follow-up with Dr. Lovena Neighbours for repeat cystoscopy and evaluation for possible surgical resection.  Management of this tumor could be dependent on her residual kidney function on the right side versus the left side.  Endoscopic treatment periodically versus surgical resection versus systemic therapy will be evaluated in the future.     2.  T1 N0 right upper lobe lung adenocarcinoma confirmed by biopsy in February 2022.   Treatment  options were reviewed at this time including surgical resection versus definitive therapy with radiation.  She is a reasonable candidate for both treatments.  Risks and benefits of both approaches were discussed.  I feel that she would have a definitive treatment regardless of what approach she takes.  I think the management of urothelial carcinoma will probably more challenging moving forward given her borderline kidney function.  After discussion today, I recommended proceeding with radiation therapy given her overall clinical presentation.  She is in agreement with that and will contact Dr. Chronic in the near future.  3. Renal function surveillance:Her kidney function continues to show residual GFR of around 30 cc/min.   4. Follow-up:  Will be in the next few weeks to follow her progress.  I discussed the assessment and treatment plan with the patient. The patient was provided an opportunity to ask questions and all were answered. The patient agreed with  the plan and demonstrated an understanding of the instructions.   The patient was advised to call back or seek an in-person evaluation if the symptoms worsen or if the condition fails to improve as anticipated.  I provided 30 minutes of face-to-face video visit time during this encounter.  The time was dedicated to reviewing pathology results, treatment options and future plan of care review. Zola Button, MD 10/22/2020 11:25 AM

## 2020-10-23 ENCOUNTER — Other Ambulatory Visit: Payer: Self-pay | Admitting: *Deleted

## 2020-10-23 NOTE — Progress Notes (Signed)
The proposed treatment discussed in cancer conference 10/23/20 is for discussion purpose only and is not a binding recommendation.  The patient was not physically examined nor present for their treatment options.  Therefore, final treatment plans cannot be decided.

## 2020-11-04 ENCOUNTER — Other Ambulatory Visit: Payer: Self-pay

## 2020-11-04 ENCOUNTER — Ambulatory Visit
Admission: RE | Admit: 2020-11-04 | Discharge: 2020-11-04 | Disposition: A | Discharge status: Home / Self Care | Payer: Medicare HMO | Source: Ambulatory Visit | Attending: Radiation Oncology | Admitting: Radiation Oncology

## 2020-11-04 DIAGNOSIS — Z510 Encounter for antineoplastic radiation therapy: Secondary | ICD-10-CM | POA: Diagnosis not present

## 2020-11-04 DIAGNOSIS — C3411 Malignant neoplasm of upper lobe, right bronchus or lung: Secondary | ICD-10-CM

## 2020-11-10 ENCOUNTER — Encounter: Payer: Self-pay | Admitting: Radiation Oncology

## 2020-11-11 ENCOUNTER — Ambulatory Visit: Payer: Medicare HMO | Admitting: Radiation Oncology

## 2020-11-12 ENCOUNTER — Other Ambulatory Visit: Payer: Self-pay | Admitting: Family Medicine

## 2020-11-12 ENCOUNTER — Telehealth: Payer: Self-pay | Admitting: Emergency Medicine

## 2020-11-12 ENCOUNTER — Ambulatory Visit: Payer: Medicare HMO | Admitting: Radiation Oncology

## 2020-11-12 DIAGNOSIS — Z510 Encounter for antineoplastic radiation therapy: Secondary | ICD-10-CM | POA: Diagnosis not present

## 2020-11-12 DIAGNOSIS — C3411 Malignant neoplasm of upper lobe, right bronchus or lung: Secondary | ICD-10-CM | POA: Diagnosis not present

## 2020-11-13 ENCOUNTER — Inpatient Hospital Stay: Payer: Medicare HMO

## 2020-11-13 ENCOUNTER — Inpatient Hospital Stay (HOSPITAL_BASED_OUTPATIENT_CLINIC_OR_DEPARTMENT_OTHER): Payer: Medicare HMO | Admitting: Oncology

## 2020-11-13 ENCOUNTER — Ambulatory Visit
Admission: RE | Admit: 2020-11-13 | Discharge: 2020-11-13 | Disposition: A | Discharge status: Home / Self Care | Payer: Medicare HMO | Source: Ambulatory Visit | Attending: Radiation Oncology | Admitting: Radiation Oncology

## 2020-11-13 ENCOUNTER — Other Ambulatory Visit: Payer: Self-pay

## 2020-11-13 VITALS — BP 123/62 | HR 76 | Temp 97.6°F | Resp 16 | Ht 66.0 in | Wt 252.9 lb

## 2020-11-13 DIAGNOSIS — C659 Malignant neoplasm of unspecified renal pelvis: Secondary | ICD-10-CM

## 2020-11-13 DIAGNOSIS — C3411 Malignant neoplasm of upper lobe, right bronchus or lung: Secondary | ICD-10-CM

## 2020-11-13 DIAGNOSIS — T451X5A Adverse effect of antineoplastic and immunosuppressive drugs, initial encounter: Secondary | ICD-10-CM | POA: Diagnosis not present

## 2020-11-13 DIAGNOSIS — Z510 Encounter for antineoplastic radiation therapy: Secondary | ICD-10-CM | POA: Diagnosis not present

## 2020-11-13 DIAGNOSIS — E890 Postprocedural hypothyroidism: Secondary | ICD-10-CM

## 2020-11-13 DIAGNOSIS — D6481 Anemia due to antineoplastic chemotherapy: Secondary | ICD-10-CM | POA: Diagnosis not present

## 2020-11-13 DIAGNOSIS — Z95828 Presence of other vascular implants and grafts: Secondary | ICD-10-CM

## 2020-11-13 DIAGNOSIS — C651 Malignant neoplasm of right renal pelvis: Secondary | ICD-10-CM | POA: Diagnosis not present

## 2020-11-13 LAB — CBC WITH DIFFERENTIAL (CANCER CENTER ONLY)
Abs Immature Granulocytes: 0.03 10*3/uL (ref 0.00–0.07)
Basophils Absolute: 0 10*3/uL (ref 0.0–0.1)
Basophils Relative: 0 %
Eosinophils Absolute: 0.2 10*3/uL (ref 0.0–0.5)
Eosinophils Relative: 3 %
HCT: 35.8 % — ABNORMAL LOW (ref 36.0–46.0)
Hemoglobin: 11.6 g/dL — ABNORMAL LOW (ref 12.0–15.0)
Immature Granulocytes: 0 %
Lymphocytes Relative: 23 %
Lymphs Abs: 1.6 10*3/uL (ref 0.7–4.0)
MCH: 30.9 pg (ref 26.0–34.0)
MCHC: 32.4 g/dL (ref 30.0–36.0)
MCV: 95.2 fL (ref 80.0–100.0)
Monocytes Absolute: 0.7 10*3/uL (ref 0.1–1.0)
Monocytes Relative: 10 %
Neutro Abs: 4.6 10*3/uL (ref 1.7–7.7)
Neutrophils Relative %: 64 %
Platelet Count: 233 10*3/uL (ref 150–400)
RBC: 3.76 MIL/uL — ABNORMAL LOW (ref 3.87–5.11)
RDW: 13.5 % (ref 11.5–15.5)
WBC Count: 7.2 10*3/uL (ref 4.0–10.5)
nRBC: 0 % (ref 0.0–0.2)

## 2020-11-13 LAB — CMP (CANCER CENTER ONLY)
ALT: 10 U/L (ref 0–44)
AST: 13 U/L — ABNORMAL LOW (ref 15–41)
Albumin: 3.6 g/dL (ref 3.5–5.0)
Alkaline Phosphatase: 69 U/L (ref 38–126)
Anion gap: 11 (ref 5–15)
BUN: 24 mg/dL — ABNORMAL HIGH (ref 8–23)
CO2: 24 mmol/L (ref 22–32)
Calcium: 8.6 mg/dL — ABNORMAL LOW (ref 8.9–10.3)
Chloride: 105 mmol/L (ref 98–111)
Creatinine: 1.45 mg/dL — ABNORMAL HIGH (ref 0.44–1.00)
GFR, Estimated: 38 mL/min — ABNORMAL LOW (ref >60)
Glucose, Bld: 123 mg/dL — ABNORMAL HIGH (ref 70–99)
Potassium: 3.8 mmol/L (ref 3.5–5.1)
Sodium: 140 mmol/L (ref 135–145)
Total Bilirubin: 0.5 mg/dL (ref 0.3–1.2)
Total Protein: 6.7 g/dL (ref 6.5–8.1)

## 2020-11-13 MED ORDER — SODIUM CHLORIDE 0.9% FLUSH
10.0000 mL | Freq: Once | INTRAVENOUS | Status: AC
  Administered 2020-11-13: 10 mL
  Filled 2020-11-13: qty 10

## 2020-11-13 MED ORDER — HEPARIN SOD (PORK) LOCK FLUSH 100 UNIT/ML IV SOLN
500.0000 [IU] | Freq: Once | INTRAVENOUS | Status: AC
  Administered 2020-11-13: 500 [IU]
  Filled 2020-11-13: qty 5

## 2020-11-13 NOTE — Patient Instructions (Signed)
Implanted Port Insertion, Care After This sheet gives you information about how to care for yourself after your procedure. Your health care provider may also give you more specific instructions. If you have problems or questions, contact your health care provider. What can I expect after the procedure? After the procedure, it is common to have:  Discomfort at the port insertion site.  Bruising on the skin over the port. This should improve over 3-4 days. Follow these instructions at home: Port care  After your port is placed, you will get a manufacturer's information card. The card has information about your port. Keep this card with you at all times.  Take care of the port as told by your health care provider. Ask your health care provider if you or a family member can get training for taking care of the port at home. A home health care nurse may also take care of the port.  Make sure to remember what type of port you have. Incision care  Follow instructions from your health care provider about how to take care of your port insertion site. Make sure you: ? Wash your hands with soap and water before and after you change your bandage (dressing). If soap and water are not available, use hand sanitizer. ? Change your dressing as told by your health care provider. ? Leave stitches (sutures), skin glue, or adhesive strips in place. These skin closures may need to stay in place for 2 weeks or longer. If adhesive strip edges start to loosen and curl up, you may trim the loose edges. Do not remove adhesive strips completely unless your health care provider tells you to do that.  Check your port insertion site every day for signs of infection. Check for: ? Redness, swelling, or pain. ? Fluid or blood. ? Warmth. ? Pus or a bad smell.      Activity  Return to your normal activities as told by your health care provider. Ask your health care provider what activities are safe for you.  Do not  lift anything that is heavier than 10 lb (4.5 kg), or the limit that you are told, until your health care provider says that it is safe. General instructions  Take over-the-counter and prescription medicines only as told by your health care provider.  Do not take baths, swim, or use a hot tub until your health care provider approves. Ask your health care provider if you may take showers. You may only be allowed to take sponge baths.  Do not drive for 24 hours if you were given a sedative during your procedure.  Wear a medical alert bracelet in case of an emergency. This will tell any health care providers that you have a port.  Keep all follow-up visits as told by your health care provider. This is important. Contact a health care provider if:  You cannot flush your port with saline as directed, or you cannot draw blood from the port.  You have a fever or chills.  You have redness, swelling, or pain around your port insertion site.  You have fluid or blood coming from your port insertion site.  Your port insertion site feels warm to the touch.  You have pus or a bad smell coming from the port insertion site. Get help right away if:  You have chest pain or shortness of breath.  You have bleeding from your port that you cannot control. Summary  Take care of the port as told by your   health care provider. Keep the manufacturer's information card with you at all times.  Change your dressing as told by your health care provider.  Contact a health care provider if you have a fever or chills or if you have redness, swelling, or pain around your port insertion site.  Keep all follow-up visits as told by your health care provider. This information is not intended to replace advice given to you by your health care provider. Make sure you discuss any questions you have with your health care provider. Document Revised: 03/07/2018 Document Reviewed: 03/07/2018 Elsevier Patient Education   2021 Elsevier Inc.  

## 2020-11-13 NOTE — Progress Notes (Signed)
Hematology and Oncology Follow Up   Maria Marquez [id number] 1946/01/28 76 y.o. 11/13/2020 9:18 AM Maria Marquez, Maria Marquez, MDAndy, Maria Fetch, MD      Principle Diagnosis: 75 year old woman with :  1.  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     2.  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     Prior Therapy:   She is status post cystoscopy and urethroscopy and a biopsy of her renal pelvic mass completed by Dr. Lovena Marquez in April 18, 2020.  This showed high-grade urothelial carcinoma.  She is status post bronchoscopy and a biopsy of her right upper lung mass which was not diagnostic.  Gemcitabine and cisplatin chemotherapy started on June 13, 2020.  She completed 3 cycles and cycle 4 was given without cisplatin completed in December 2021.  She is status post bronchoscopy and a biopsy completed by Dr. Roxan Marquez on October 10, 2020 which confirmed the presence of primary lung neoplasm.    Current therapy: SBRT to her lung tumor currently undergoing with anticipated completion in April 2022.  Interim History: Maria Marquez returns today for a follow-up visit.  Since the last visit, she reports no major changes in her health.  She continues to recover from her chemotherapy effects without any recent hospitalization or illnesses.  He does report some mild fatigue and tiredness.  She denies any shortness of breath, cough or wheezing.  .    Medications: Updated on review. Current Outpatient Medications  Medication Sig Dispense Refill  . acetaminophen (TYLENOL) 500 MG tablet Take 500-1,500 mg by mouth every 8 (eight) hours as needed for moderate pain.    [name] albuterol (VENTOLIN HFA) 108 (90 Base) MCG/ACT inhaler Inhale 1-2 puffs into the lungs every 6 (six) hours as needed for wheezing or shortness of breath. 18 g 0  . calcium carbonate (TUMS - DOSED IN MG ELEMENTAL  CALCIUM) 500 MG chewable tablet Chew 500 mg by mouth daily as needed for indigestion or heartburn.    [name] EPINEPHrine 0.3 mg/0.3 mL IJ SOAJ injection Inject 0.3 mg into the muscle as needed for anaphylaxis. 1 each 2  . estradiol-norethindrone (ACTIVELLA) 1-0.5 MG tablet Take 1 tablet by mouth daily. 28 tablet 0  . ezetimibe (ZETIA) 10 MG tablet TAKE 1 TABLET EVERY DAY (Patient taking differently: Take 10 mg by mouth daily.) 90 tablet 3  . levothyroxine (SYNTHROID) 125 MCG tablet Take 1 tablet (125 mcg total) by mouth at bedtime. 90 tablet 1  . lidocaine-prilocaine (EMLA) cream Apply 1 application topically as needed. (Patient taking differently: Apply 1 application topically as needed (port access).) 30 g 0  . losartan-hydrochlorothiazide (HYZAAR) 50-12.5 MG tablet TAKE 1 TABLET EVERY DAY (Patient taking differently: Take 1 tablet by mouth daily.) 90 tablet 3  . simvastatin (ZOCOR) 10 MG tablet TAKE 1 TABLET (10 MG TOTAL) BY MOUTH 2 (TWO) TIMES A WEEK. (Patient taking differently: Take 10 mg by mouth 2 (two) times a week. Saturday and Sunday) 26 tablet 3  . XIIDRA 5 % SOLN Place 1 drop into both eyes 2 (two) times daily as needed (dry eyes).     No current facility-administered medications for this visit.     Allergies:  Allergies  Allergen Reactions  . Iodine Shortness Of Breath  . Keflex [Cephalexin] Shortness Of Breath, Rash and Tinitus  . Shellfish Allergy Hives  . Lasix [Furosemide] Itching  . Rosuvastatin Other (  See Comments)    Muscle Pain in Thighs  . Latex Rash  . Lisinopril Cough  . Sulfa Antibiotics Nausea Only  . Tramadol Itching   Physical exam: Blood pressure 123/62, pulse 76, temperature 97.6 F (36.4 C), temperature source Tympanic, resp. rate 16, height 5\' 6"  (1.676 m), weight 252 lb 14.4 oz (114.7 kg), SpO2 98 %.   ECOG 1  General appearance: Comfortable appearing without any discomfort Head: Normocephalic without any trauma Oropharynx: Mucous membranes are moist  and pink without any thrush or ulcers. Eyes: Pupils are equal and round reactive to light. Lymph nodes: No cervical, supraclavicular, inguinal or axillary lymphadenopathy.   Heart:regular rate and rhythm.  S1 and S2 without leg edema. Lung: Clear without any rhonchi or wheezes.  No dullness to percussion. Abdomin: Soft, nontender, nondistended with good bowel sounds.  No hepatosplenomegaly. Musculoskeletal: No joint deformity or effusion.  Full range of motion noted. Neurological: No deficits noted on motor, sensory and deep tendon reflex exam. Skin: No petechial rash or dryness.  Appeared moist.  Psychiatric: Mood and affect appeared appropriate.    Lab Results: Lab Results  Component Value Date   WBC 7.1 10/09/2020   HGB 11.2 (L) 10/09/2020   HCT 34.3 (L) 10/09/2020   MCV 101.2 (H) 10/09/2020   PLT 242 10/09/2020     Chemistry      Component Value Date/Time   NA 139 10/09/2020 0948   NA 140 12/19/2019 1132   K 3.8 10/09/2020 0948   CL 105 10/09/2020 0948   CO2 26 10/09/2020 0948   BUN 32 (H) 10/09/2020 0948   BUN 25 12/19/2019 1132   CREATININE 1.71 (H) 10/09/2020 0948   CREATININE 1.42 (H) 09/19/2020 0805   GLU 92 02/04/2016 0000      Component Value Date/Time   CALCIUM 9.2 10/09/2020 0948   ALKPHOS 55 10/09/2020 0948   AST 19 10/09/2020 0948   AST 16 09/19/2020 0805   ALT 19 10/09/2020 0948   ALT 15 09/19/2020 0805   BILITOT 0.9 10/09/2020 0948   BILITOT 0.8 09/19/2020 0805        Impression and Plan:   75 year old woman with  1.    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     She is scheduled evaluation by Dr. Lovena Marquez in the near future which include a potential cystoscopy to evaluate her tumor.  Definitive treatment option including surgical resection versus radiation versus endoscopic management were reiterated.  At this time, she does not require any additional systemic therapy.    2.  Adenocarcinoma  of the lung confirmed in February 2022.  She has residual T1 N0 disease.  She is scheduled to start radiation therapy for definitive purposes utilizing SBRT.  I anticipate excellent results at this time from this approach.  3. Renal function surveillance:We will continue to monitor post chemotherapy.  Creatinine clearance around 30 cc/min.  4.  Port-A-Cath management: This will be flushed periodically.  Risks and benefits of removing her Port-A-Cath were discussed.  It is possible that she might require systemic therapy in the future we will leave her Port-A-Cath for the time being.  5.  Anemia: Related to chemotherapy and improving currently.  Her hemoglobin currently at 11.6.  6. Follow-up:  In 6 weeks for repeat follow-up.  30  minutes were dedicated to this visit. The time was spent on reviewing laboratory data, discussing treatment options, discussing complications related to therapy and answering questions regarding future plan.  Zola Button, MD 11/13/2020 9:18 AM

## 2020-11-14 ENCOUNTER — Other Ambulatory Visit: Payer: Self-pay

## 2020-11-14 ENCOUNTER — Ambulatory Visit
Admission: RE | Admit: 2020-11-14 | Discharge: 2020-11-14 | Disposition: A | Discharge status: Home / Self Care | Payer: Medicare HMO | Source: Ambulatory Visit | Attending: Radiation Oncology | Admitting: Radiation Oncology

## 2020-11-14 DIAGNOSIS — Z510 Encounter for antineoplastic radiation therapy: Secondary | ICD-10-CM | POA: Diagnosis not present

## 2020-11-14 DIAGNOSIS — C3411 Malignant neoplasm of upper lobe, right bronchus or lung: Secondary | ICD-10-CM | POA: Diagnosis not present

## 2020-11-17 ENCOUNTER — Other Ambulatory Visit: Payer: Self-pay | Admitting: Internal Medicine

## 2020-11-17 ENCOUNTER — Other Ambulatory Visit: Payer: Self-pay

## 2020-11-17 ENCOUNTER — Ambulatory Visit
Admission: RE | Admit: 2020-11-17 | Discharge: 2020-11-17 | Disposition: A | Discharge status: Home / Self Care | Payer: Medicare HMO | Source: Ambulatory Visit | Attending: Radiation Oncology | Admitting: Radiation Oncology

## 2020-11-17 DIAGNOSIS — C3411 Malignant neoplasm of upper lobe, right bronchus or lung: Secondary | ICD-10-CM

## 2020-11-17 DIAGNOSIS — Z510 Encounter for antineoplastic radiation therapy: Secondary | ICD-10-CM | POA: Diagnosis not present

## 2020-11-17 DIAGNOSIS — Z1231 Encounter for screening mammogram for malignant neoplasm of breast: Secondary | ICD-10-CM

## 2020-11-18 ENCOUNTER — Other Ambulatory Visit: Payer: Self-pay | Admitting: Internal Medicine

## 2020-11-18 ENCOUNTER — Ambulatory Visit
Admission: RE | Admit: 2020-11-18 | Discharge: 2020-11-18 | Disposition: A | Discharge status: Home / Self Care | Payer: Medicare HMO | Source: Ambulatory Visit | Attending: Radiation Oncology | Admitting: Radiation Oncology

## 2020-11-18 ENCOUNTER — Other Ambulatory Visit (INDEPENDENT_AMBULATORY_CARE_PROVIDER_SITE_OTHER): Payer: Medicare HMO

## 2020-11-18 DIAGNOSIS — C3411 Malignant neoplasm of upper lobe, right bronchus or lung: Secondary | ICD-10-CM

## 2020-11-18 DIAGNOSIS — E890 Postprocedural hypothyroidism: Secondary | ICD-10-CM

## 2020-11-18 DIAGNOSIS — Z510 Encounter for antineoplastic radiation therapy: Secondary | ICD-10-CM | POA: Diagnosis not present

## 2020-11-18 LAB — TSH: TSH: 1.99 u[IU]/mL (ref 0.35–4.50)

## 2020-11-19 ENCOUNTER — Other Ambulatory Visit: Payer: Self-pay

## 2020-11-19 ENCOUNTER — Ambulatory Visit
Admission: RE | Admit: 2020-11-19 | Discharge: 2020-11-19 | Disposition: A | Discharge status: Home / Self Care | Payer: Medicare HMO | Source: Ambulatory Visit | Attending: Radiation Oncology | Admitting: Radiation Oncology

## 2020-11-19 DIAGNOSIS — Z510 Encounter for antineoplastic radiation therapy: Secondary | ICD-10-CM | POA: Diagnosis not present

## 2020-11-19 DIAGNOSIS — C3411 Malignant neoplasm of upper lobe, right bronchus or lung: Secondary | ICD-10-CM

## 2020-11-20 ENCOUNTER — Ambulatory Visit
Admission: RE | Admit: 2020-11-20 | Discharge: 2020-11-20 | Disposition: A | Discharge status: Home / Self Care | Payer: Medicare HMO | Source: Ambulatory Visit | Attending: Radiation Oncology | Admitting: Radiation Oncology

## 2020-11-20 ENCOUNTER — Other Ambulatory Visit: Payer: Self-pay | Admitting: Oncology

## 2020-11-20 ENCOUNTER — Telehealth: Payer: Self-pay | Admitting: *Deleted

## 2020-11-20 DIAGNOSIS — C3411 Malignant neoplasm of upper lobe, right bronchus or lung: Secondary | ICD-10-CM | POA: Diagnosis not present

## 2020-11-20 DIAGNOSIS — Z510 Encounter for antineoplastic radiation therapy: Secondary | ICD-10-CM | POA: Diagnosis not present

## 2020-11-20 NOTE — Progress Notes (Deleted)
   I, Peterson Lombard, LAT, ATC acting as a scribe for Lynne Leader, MD.  Subjective:    CC: R?L arch pain  HPI: Pt is a 75 y/o female c/o arch pain ongoing for //. Pt locates pain to  Aggravates: Treatments tried:  Pertinent review of Systems: ***  Relevant historical information: ***   Objective:   There were no vitals filed for this visit. General: Well Developed, well nourished, and in no acute distress.   MSK: ***  Lab and Radiology Results Results for orders placed or performed in visit on 11/18/20 (from the past 72 hour(s))  TSH     Status: None   Collection Time: 11/18/20 11:13 AM  Result Value Ref Range   TSH 1.99 0.35 - 4.50 uIU/mL   No results found.    Impression and Recommendations:    Assessment and Plan: 75 y.o. female with ***.  PDMP not reviewed this encounter. No orders of the defined types were placed in this encounter.  No orders of the defined types were placed in this encounter.   Discussed warning signs or symptoms. Please see discharge instructions. Patient expresses understanding.   ***

## 2020-11-20 NOTE — Telephone Encounter (Signed)
Phone call to patient, informed her [facility] information form filled out and signed by Dr. Alen Blew, she verbalizes understanding.  She is picking up form at the [facility] front desk tomorrow, 11/21/20.

## 2020-11-21 ENCOUNTER — Ambulatory Visit: Payer: Medicare HMO | Admitting: Family Medicine

## 2020-11-21 ENCOUNTER — Ambulatory Visit
Admission: RE | Admit: 2020-11-21 | Discharge: 2020-11-21 | Disposition: A | Discharge status: Home / Self Care | Payer: Medicare HMO | Source: Ambulatory Visit | Attending: Radiation Oncology | Admitting: Radiation Oncology

## 2020-11-21 ENCOUNTER — Other Ambulatory Visit: Payer: Self-pay

## 2020-11-21 DIAGNOSIS — C3411 Malignant neoplasm of upper lobe, right bronchus or lung: Secondary | ICD-10-CM | POA: Diagnosis not present

## 2020-11-24 ENCOUNTER — Other Ambulatory Visit: Payer: Self-pay

## 2020-11-24 ENCOUNTER — Ambulatory Visit
Admission: RE | Admit: 2020-11-24 | Discharge: 2020-11-24 | Disposition: A | Discharge status: Home / Self Care | Payer: Medicare HMO | Source: Ambulatory Visit | Attending: Radiation Oncology | Admitting: Radiation Oncology

## 2020-11-24 DIAGNOSIS — C3411 Malignant neoplasm of upper lobe, right bronchus or lung: Secondary | ICD-10-CM | POA: Diagnosis not present

## 2020-11-24 DIAGNOSIS — N183 Chronic kidney disease, stage 3 unspecified: Secondary | ICD-10-CM | POA: Diagnosis not present

## 2020-11-24 DIAGNOSIS — D49511 Neoplasm of unspecified behavior of right kidney: Secondary | ICD-10-CM | POA: Diagnosis not present

## 2020-11-24 LAB — ACID FAST CULTURE WITH REFLEXED SENSITIVITIES (MYCOBACTERIA): Acid Fast Culture: NEGATIVE

## 2020-11-24 NOTE — Progress Notes (Signed)
I, Peterson Lombard, LAT, ATC acting as a scribe for Lynne Leader, MD.  Subjective:    CC: Bilateral foot pain  HPI: Pt is a 75 y/o female c/o bilat, L>R, foot pain ongoing for 4 weeks. Pt locates pain to closer to heel and feels "hot." Of note, pt is currently being treated for lung cancer and feels the foot pain may be related to her chemo treatments.   Aggravates: increased pain first step down in the morning Treatments tried: ice, stretches, orthotics, different shoes, massage  Pertinent review of Systems: No fevers or chills  Relevant historical information: 2 primary cancers currently being treated.  Primary cancer of the lung, and primary cancer of the renal pelvis.   Objective:    Vitals:   11/25/20 1244  BP: 140/84  Pulse: 75  SpO2: 96%   General: Well Developed, well nourished, and in no acute distress.   MSK: Left foot normal-appearing Normal motion. Minimally tender palpation plantar calcaneus posterior aspect. Strength intact. Pulses intact capillary refill intact.  Right foot normal-appearing Normal motion. Not particularly tender to palpation. Strength intact. Pulses intact.  Capillary refill intact  Lab and Radiology Results  X-ray images bilateral os calcis obtained today personally and independently interpreted  Right os calcis: Moderate plantar osteophyte.  No fractures or aggressive appearing bone lesions.  Left os calcis: Tiny plantar osteophyte.  No fractures or aggressive appearing bone lesions  Await formal radiology review  Diagnostic Limited MSK Ultrasound of: Left calcaneus Normal-appearing insertion of Achilles tendon onto posterior calcaneus. Plantar fascia insertion site with mild bone spur/osteophyte.  Normal-appearing plantar fascia otherwise.   Plantar calcaneus is normal-appearing otherwise Impression: Probable plantar fasciitis   Impression and Recommendations:    Assessment and Plan: 75 y.o. female with bilateral  plantar calcaneus pain left significantly worse than right.  Pain is most consistent with plantar fasciitis.  Discussed options.  Plan on maximizing conservative management strategies with eccentric exercises taught in clinic today with ATC.  Additionally recommend night splint, gel heel cushion, and ice massage.  Check back in about a month.  PDMP not reviewed this encounter. Orders Placed This Encounter  Procedures  . [name] LIMITED JOINT SPACE STRUCTURES LOW BILAT(NO LINKED CHARGES)    Standing Status:   Future    Number of Occurrences:   1    Standing Expiration Date:   05/27/2021    Order Specific Question:   Reason for Exam (SYMPTOM  OR DIAGNOSIS REQUIRED)    Answer:   chronic foot pain    Order Specific Question:   Preferred imaging location?    Answer:   [place]  . DG Os Calcis Right    Standing Status:   Future    Number of Occurrences:   1    Standing Expiration Date:   11/25/2021    Order Specific Question:   Reason for Exam (SYMPTOM  OR DIAGNOSIS REQUIRED)    Answer:   chronic arch pain    Order Specific Question:   Preferred imaging location?    Answer:   [facility]  . DG Os Calcis Left    Standing Status:   Future    Number of Occurrences:   1    Standing Expiration Date:   11/25/2021    Order Specific Question:   Reason for Exam (SYMPTOM  OR DIAGNOSIS REQUIRED)    Answer:   chronic arch pain    Order Specific Question:   Preferred imaging location?  Answer:   [facility]   No orders of the defined types were placed in this encounter.   Discussed warning signs or symptoms. Please see discharge instructions. Patient expresses understanding.   The above documentation has been reviewed and is accurate and complete Lynne Leader, M.D.

## 2020-11-25 ENCOUNTER — Ambulatory Visit: Payer: Self-pay

## 2020-11-25 ENCOUNTER — Encounter: Payer: Self-pay | Admitting: Internal Medicine

## 2020-11-25 ENCOUNTER — Other Ambulatory Visit: Payer: Self-pay

## 2020-11-25 ENCOUNTER — Ambulatory Visit (INDEPENDENT_AMBULATORY_CARE_PROVIDER_SITE_OTHER): Payer: Medicare HMO | Admitting: Family Medicine

## 2020-11-25 ENCOUNTER — Ambulatory Visit (INDEPENDENT_AMBULATORY_CARE_PROVIDER_SITE_OTHER): Payer: Medicare HMO

## 2020-11-25 ENCOUNTER — Ambulatory Visit
Admission: RE | Admit: 2020-11-25 | Discharge: 2020-11-25 | Disposition: A | Discharge status: Home / Self Care | Payer: Medicare HMO | Source: Ambulatory Visit | Attending: Radiation Oncology | Admitting: Radiation Oncology

## 2020-11-25 VITALS — BP 140/84 | HR 75 | Ht 66.0 in | Wt 252.6 lb

## 2020-11-25 DIAGNOSIS — Z79890 Hormone replacement therapy (postmenopausal): Secondary | ICD-10-CM

## 2020-11-25 DIAGNOSIS — C3411 Malignant neoplasm of upper lobe, right bronchus or lung: Secondary | ICD-10-CM | POA: Diagnosis not present

## 2020-11-25 DIAGNOSIS — M7732 Calcaneal spur, left foot: Secondary | ICD-10-CM | POA: Diagnosis not present

## 2020-11-25 DIAGNOSIS — C659 Malignant neoplasm of unspecified renal pelvis: Secondary | ICD-10-CM

## 2020-11-25 DIAGNOSIS — M79673 Pain in unspecified foot: Secondary | ICD-10-CM

## 2020-11-25 DIAGNOSIS — M79671 Pain in right foot: Secondary | ICD-10-CM

## 2020-11-25 DIAGNOSIS — M79672 Pain in left foot: Secondary | ICD-10-CM

## 2020-11-25 DIAGNOSIS — M7731 Calcaneal spur, right foot: Secondary | ICD-10-CM | POA: Diagnosis not present

## 2020-11-25 MED ORDER — ESTRADIOL-NORETHINDRONE ACET 1-0.5 MG PO TABS: 1.0000 | ORAL_TABLET | Freq: Every day | ORAL | 0 refills | Status: DC

## 2020-11-25 NOTE — Patient Instructions (Addendum)
Thank you for coming in today.  Please get an Xray today before you leave.  Gel cups.  Please complete the exercises that the athletic trainer went over with you: View at [email or web address] using code: [email or web address]  Follow up in 1 month

## 2020-11-26 ENCOUNTER — Ambulatory Visit
Admission: RE | Admit: 2020-11-26 | Discharge: 2020-11-26 | Disposition: A | Discharge status: Home / Self Care | Payer: Medicare HMO | Source: Ambulatory Visit | Attending: Radiation Oncology | Admitting: Radiation Oncology

## 2020-11-26 ENCOUNTER — Encounter: Payer: Self-pay | Admitting: Radiation Oncology

## 2020-11-26 ENCOUNTER — Other Ambulatory Visit: Payer: Self-pay | Admitting: Family Medicine

## 2020-11-26 ENCOUNTER — Other Ambulatory Visit: Payer: Self-pay

## 2020-11-26 DIAGNOSIS — C3411 Malignant neoplasm of upper lobe, right bronchus or lung: Secondary | ICD-10-CM

## 2020-11-27 NOTE — Progress Notes (Signed)
X-ray right heel shows mild spur otherwise looks normal to radiology

## 2020-11-27 NOTE — Progress Notes (Signed)
X-ray left heel looks normal to radiology

## 2020-12-04 ENCOUNTER — Other Ambulatory Visit: Payer: Self-pay | Admitting: Urology

## 2020-12-05 ENCOUNTER — Telehealth: Payer: Self-pay | Admitting: Oncology

## 2020-12-05 NOTE — Telephone Encounter (Signed)
Rescheduled 05/11 to 05/18 per patient's request, provider approved of time.

## 2020-12-17 ENCOUNTER — Encounter: Payer: Self-pay | Admitting: Radiation Oncology

## 2020-12-23 ENCOUNTER — Encounter: Payer: Self-pay | Admitting: Radiology

## 2020-12-24 ENCOUNTER — Encounter (HOSPITAL_BASED_OUTPATIENT_CLINIC_OR_DEPARTMENT_OTHER): Payer: Self-pay | Admitting: Urology

## 2020-12-24 NOTE — Progress Notes (Signed)
   I, Wendy Poet, LAT, ATC, am serving as scribe for Dr. Lynne Leader.  Maria Marquez is a 75 y.o. female who presents to [place] [facility] today for f/u of B heel pain.  She was last seen by Dr. Georgina Snell on 11/25/20 and was shown eccentric exercises.  Since her last visit, she reports that her heels are feeling better overall, noting approximately 50% improvement.  Today is not a great day, noting that her L heel is bothering her the most w/ some radiating pain into her L Achille's.    Pertinent review of systems: No fevers or chills  Relevant historical information: Currently being treated for 2 different primary cancers and lung cancer and a pelvic/urinary cancer.   Exam:  BP 106/70 (BP Location: Left Arm, Patient Position: Sitting, Cuff Size: Large)   Pulse 70   Ht 5\' 6"  (1.676 m)   Wt 254 lb 9.6 oz (115.5 kg)   SpO2 98%   BMI 41.09 kg/m  General: Well Developed, well nourished, and in no acute distress.   MSK: Left heel normal-appearing normal motion.  Mildly tender palpation plantar and posterior calcaneus.    Lab and Radiology Results   EXAM: LEFT OS CALCIS - 2+ VIEW  COMPARISON:  None.  FINDINGS: There is no evidence of fracture or other focal bone lesions. Soft tissues are unremarkable.  IMPRESSION: Negative.   Electronically Signed   By: Marijo Conception M.D.   On: 11/26/2020 13:19  EXAM: RIGHT OS CALCIS - 2+ VIEW  COMPARISON:  None.  FINDINGS: There is no evidence of fracture. Mild posterior calcaneal spurring is noted. Soft tissues are unremarkable.  IMPRESSION: Mild posterior calcaneal spurring.  No acute abnormality is noted.   Electronically Signed   By: Marijo Conception M.D.   On: 11/26/2020 13:20  I, Lynne Leader, personally (independently) visualized and performed the interpretation of the images attached in this note.    Assessment and Plan: 75 y.o. female with bilateral heel pain.  Pain primarily due  to Planter fasciitis with some Achilles tendinitis as well.  She is improving with conservative management.  Plan to continue conservative management.  Discussed strategies to use including icing and eccentric exercises.  Consider injection if needed however would like to avoid that if possible.  Recheck back as needed.    Discussed warning signs or symptoms. Please see discharge instructions. Patient expresses understanding.   The above documentation has been reviewed and is accurate and complete Lynne Leader, M.D.  Total encounter time 20 minutes including face-to-face time with the patient and, reviewing past medical record, and charting on the date of service.   Treatment plan and options

## 2020-12-25 ENCOUNTER — Ambulatory Visit: Payer: Self-pay

## 2020-12-25 ENCOUNTER — Encounter: Payer: Self-pay | Admitting: Family Medicine

## 2020-12-25 ENCOUNTER — Other Ambulatory Visit: Payer: Self-pay

## 2020-12-25 ENCOUNTER — Encounter (HOSPITAL_BASED_OUTPATIENT_CLINIC_OR_DEPARTMENT_OTHER): Payer: Self-pay | Admitting: Urology

## 2020-12-25 ENCOUNTER — Ambulatory Visit (INDEPENDENT_AMBULATORY_CARE_PROVIDER_SITE_OTHER): Payer: Medicare HMO | Admitting: Family Medicine

## 2020-12-25 VITALS — BP 106/70 | HR 70 | Ht 66.0 in | Wt 254.6 lb

## 2020-12-25 DIAGNOSIS — M79672 Pain in left foot: Secondary | ICD-10-CM

## 2020-12-25 NOTE — Patient Instructions (Signed)
Thank you for coming in today.  Continue the home exercises.   Ice massage as needed.   Recheck as needed.   This will likely slowly improve.

## 2020-12-26 ENCOUNTER — Other Ambulatory Visit: Payer: Self-pay

## 2020-12-26 ENCOUNTER — Encounter (HOSPITAL_BASED_OUTPATIENT_CLINIC_OR_DEPARTMENT_OTHER): Payer: Self-pay | Admitting: Urology

## 2020-12-26 NOTE — Progress Notes (Signed)
Spoke w/ via phone for pre-op interview--- PT Lab needs dos---- Istat              Lab results------ current ekg in chart/ epic COVID test ------ 12-29-2020 @ 1200  Arrive at -------  0630 on 12-31-2020 NPO after MN NO Solid Food.  Clear liquids from MN until--- 0530 Med rec completed Medications to take morning of surgery ----- NONE Diabetic medication ----- n/a Patient instructed to bring photo id and insurance card day of surgery Patient aware to have Driver (ride ) / caregiver    for 24 hours after surgery -- daughter, Abby Potash Patient Special Instructions ----- n/a Pre-Op special Istructions ----- n/a Patient verbalized understanding of instructions that were given at this phone interview. Patient denies shortness of breath, chest pain, fever, cough at this phone interview.   Anesthesia :  HTN;  Dx RUL lung cancer 02/ 2022  getting SBRT , last one 11-26-2020 ;  Dx Right Renal pelvis cancer 08/ 2021 completed chemo 12/ 2021;  CKD 3;  Pre-diabetes;  Pt stated uses rescue as needed, last used 2 wks ago, only has sob with flight stairs.  PCP: Dr A[facility] Oncology:  Dr Alen Blew [facility] 11-13-2020 epic) Chest x-ray :  10-09-2020 /  chest CT 06-04-2020 epic EKG :  04-15-2020 epic Echo : no Stress test: no Activity level:  No sob w/ household chores Sleep Study/ CPAP :  no   Blood Thinner/ Instructions Maryjane Hurter Dose:  NO ASA / Instructions/ Last Dose : ASA 81mg /  Pt stated told by Dr Lovena Neighbours office to stop prior to surgery, last dose 12-23-2020

## 2020-12-28 NOTE — Progress Notes (Signed)
Radiation Oncology         [phone] ________________________________  Name: Maria Marquez MRN: [id number]  Date: 12/29/2020  DOB: Feb 04, 1946  Follow-Up Visit Note  CC: [name], Rayford Halsted, MD  [name], *    ICD-10-CM   1. 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  She is scheduled for cystoscopy and potential biopsy later this week. On review of systems, she reports significant fatigue. She denies cough or breathing problems.  She denies any hemoptysis or shortness of breath.  She is concerned about energy decreased energy level.                            ALLERGIES:  is allergic to iodine, keflex [cephalexin], shellfish allergy, lasix [furosemide], rosuvastatin, latex, lisinopril, sulfa antibiotics, and tramadol.  Meds: Current Outpatient Medications  Medication Sig Dispense Refill  . acetaminophen (TYLENOL) 500 MG tablet Take 500-1,500 mg by mouth every 8 (eight) hours as needed for moderate pain.    [name] albuterol (VENTOLIN HFA) 108 (90 Base) MCG/ACT inhaler Inhale 1-2 puffs into the lungs every 6 (six) hours as needed for wheezing or shortness of breath. 18 g 0  . calcium carbonate (TUMS - DOSED IN MG ELEMENTAL CALCIUM) 500 MG chewable tablet Chew 500 mg by mouth daily as needed for indigestion or heartburn.    [name] EPINEPHrine 0.3 mg/0.3 mL IJ SOAJ injection Inject 0.3 mg into the muscle as needed for anaphylaxis. 1 each 2  . estradiol-norethindrone (ACTIVELLA) 1-0.5 MG tablet Take 1 tablet by mouth daily. (Patient taking differently: Take 1 tablet by  mouth as directed. Per pt takes every 3rd day) 28 tablet 0  . ezetimibe (ZETIA) 10 MG tablet TAKE 1 TABLET EVERY DAY (Patient taking differently: Take 10 mg by mouth at bedtime.) 90 tablet 3  . levothyroxine (SYNTHROID) 125 MCG tablet Take 1 tablet (125 mcg total) by mouth at bedtime. (Patient taking differently: Take 125 mcg by mouth at bedtime.) 90 tablet 1  . lidocaine-prilocaine (EMLA) cream Apply 1 application topically as needed. (Patient taking differently: Apply 1 application topically as needed (port access).) 30 g 0  . losartan-hydrochlorothiazide (HYZAAR) 50-12.5 MG tablet TAKE 1 TABLET EVERY DAY (Patient taking differently: Take 1 tablet by mouth daily.) 90 tablet 3  . XIIDRA 5 % SOLN Place 1 drop into both eyes 2 (two) times daily as needed (dry eyes).    [name] aspirin EC 81 MG tablet Take 81 mg by mouth daily. Swallow whole. (Patient not taking: Reported on 12/29/2020)     No current facility-administered medications for this encounter.    Physical Findings: The patient is in no acute distress. Patient is alert and oriented.  height is 5\' 6"  (1.676 m) and weight is 258 lb 4 oz (117.1 kg). Her temporal temperature is 97 F (36.1 C) (abnormal). Her blood pressure is 151/73 (abnormal) and her pulse is 77. Her respiration is 18 and oxygen saturation is 100%.   Lungs are clear to auscultation bilaterally. Heart has regular rate and rhythm. No palpable cervical,  supraclavicular, or axillary adenopathy. Abdomen soft, non-tender, normal bowel sounds.   Lab Findings: Lab Results  Component Value Date   WBC 7.2 11/13/2020   HGB 11.6 (L) 11/13/2020   HCT 35.8 (L) 11/13/2020   MCV 95.2 11/13/2020   PLT 233 11/13/2020    Radiographic Findings: No results found.  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  She continues to have significant fatigue at this point.  Plan: The patient is scheduled to  follow up with Dr. Alen Blew on 01/07/2021. She will follow up with radiation oncology in 3 months with a chest CT scan to be done prior to that visit.    ____________________________________   Blair Promise, PhD, MD  This document serves as a record of services personally performed by Gery Pray, MD. It was created on his behalf by Clerance Lav, a trained medical scribe. The creation of this record is based on the scribe's personal observations and the provider's statements to them. This document has been checked and approved by the attending provider.

## 2020-12-28 NOTE — Progress Notes (Incomplete)
  Patient Name: Maria Marquez MRN: [id number] DOB: 05-Dec-1945 Referring Physician: Modesto Charon (Profile Not Attached) Date of Service: 11/26/2020 [facility] [place]                                                        End Of Treatment Note  Diagnoses: C34.11-Malignant neoplasm of upper lobe, right bronchus or lung  Cancer Staging: Clinical Stage IA (T1, N0) adenocarcinoma of the right upper lobe  Intent: Curative  Radiation Treatment Dates: 11/13/2020 through 11/26/2020  Site: Right Lung Technique: IMRT Total Dose (Gy): 50/50 Dose per Fx (Gy): 5 Completed Fx: 10/10 Beam Energies: 6X  Narrative: The patient tolerated radiation therapy relatively well. She did report mild to moderate fatigue, productive cough with thick phlegm, shortness of breath when coughing, two episodes of palpitations that spontaneously resolved, and "having a prickly chest like she was getting a chest cold". Appetite remained stable.  Plan: The patient will follow-up with radiation oncology in one month.  ________________________________________________   Blair Promise, PhD, MD  This document serves as a record of services personally performed by Gery Pray, MD. It was created on his behalf by Clerance Lav, a trained medical scribe. The creation of this record is based on the scribe's personal observations and the provider's statements to them. This document has been checked and approved by the attending provider.

## 2020-12-29 ENCOUNTER — Ambulatory Visit
Admission: RE | Admit: 2020-12-29 | Discharge: 2020-12-29 | Disposition: A | Discharge status: Home / Self Care | Payer: Medicare HMO | Source: Ambulatory Visit | Attending: Radiation Oncology | Admitting: Radiation Oncology

## 2020-12-29 ENCOUNTER — Other Ambulatory Visit (HOSPITAL_COMMUNITY)
Admission: RE | Admit: 2020-12-29 | Discharge: 2020-12-29 | Disposition: A | Discharge status: Home / Self Care | Payer: Medicare HMO | Source: Ambulatory Visit | Attending: Urology | Admitting: Urology

## 2020-12-29 ENCOUNTER — Encounter: Payer: Self-pay | Admitting: Radiation Oncology

## 2020-12-29 ENCOUNTER — Other Ambulatory Visit: Payer: Self-pay

## 2020-12-29 DIAGNOSIS — Y842 Radiological procedure and radiotherapy as the cause of abnormal reaction of the patient, or of later complication, without mention of misadventure at the time of the procedure: Secondary | ICD-10-CM | POA: Insufficient documentation

## 2020-12-29 DIAGNOSIS — Z79899 Other long term (current) drug therapy: Secondary | ICD-10-CM | POA: Insufficient documentation

## 2020-12-29 DIAGNOSIS — R5383 Other fatigue: Secondary | ICD-10-CM | POA: Insufficient documentation

## 2020-12-29 DIAGNOSIS — C3411 Malignant neoplasm of upper lobe, right bronchus or lung: Secondary | ICD-10-CM | POA: Diagnosis not present

## 2020-12-29 DIAGNOSIS — Z20822 Contact with and (suspected) exposure to covid-19: Secondary | ICD-10-CM | POA: Insufficient documentation

## 2020-12-29 DIAGNOSIS — Z7982 Long term (current) use of aspirin: Secondary | ICD-10-CM | POA: Insufficient documentation

## 2020-12-29 DIAGNOSIS — Z01812 Encounter for preprocedural laboratory examination: Secondary | ICD-10-CM | POA: Insufficient documentation

## 2020-12-29 DIAGNOSIS — Z923 Personal history of irradiation: Secondary | ICD-10-CM | POA: Insufficient documentation

## 2020-12-29 HISTORY — DX: Personal history of irradiation: Z92.3

## 2020-12-29 NOTE — Progress Notes (Signed)
Maria Marquez is here today for follow up post radiation to the lung.  Lung Side: Right  Does the patient complain of any of the following: . Pain: Patient denies pain . Shortness of breath w/wo exertion: Patient reports having shortness of breath on exertion.  . Cough: Productive cough . Hemoptysis: no . Pain with swallowing: no . Swallowing/choking concerns: no . Appetite: Good . Energy Level: Moderate fatigue . Post radiation skin Changes: Patient denies skin changes.    Additional comments if applicable: Vitals:   70/14/10 1510  BP: (!) 151/73  Pulse: 77  Resp: 18  Temp: (!) 97 F (36.1 C)  TempSrc: Temporal  SpO2: 100%  Weight: 258 lb 4 oz (117.1 kg)  Height: 5\' 6"  (1.676 m)

## 2020-12-30 LAB — SARS CORONAVIRUS 2 (TAT 6-24 HRS): SARS Coronavirus 2: NEGATIVE

## 2020-12-30 NOTE — Anesthesia Preprocedure Evaluation (Addendum)
Anesthesia Evaluation  Patient identified by MRN, date of birth, ID band Patient awake    Reviewed: Allergy & Precautions, NPO status , Patient's Chart, lab work & pertinent test results  History of Anesthesia Complications (+) PONV and history of anesthetic complications  Airway Mallampati: III  TM Distance: >3 FB     Dental  (+) Partial Upper, Partial Lower,  Chipped crown:   Pulmonary Current Smoker and Patient abstained from smoking., former smoker,    Pulmonary exam normal breath sounds clear to auscultation + decreased breath sounds      Cardiovascular hypertension, Pt. on medications Normal cardiovascular exam Rhythm:Regular  Normal sinus rhythm Low voltage QRS Borderline ECG No significant change since last tracing Confirmed by Shelva Majestic [phone]) on 04/15/2020 5:24:14 P   Neuro/Psych PSYCHIATRIC DISORDERS Depression negative neurological ROS     GI/Hepatic Neg liver ROS, GERD  ,  Endo/Other  Hypothyroidism Morbid obesityObesity BMI 42  Renal/GU Renal InsufficiencyRenal disease  negative genitourinary   Musculoskeletal  (+) Arthritis ,   Abdominal (+) + obese,   Peds negative pediatric ROS (+)  Hematology  (+) anemia ,   Anesthesia Other Findings Eggs N/  Reproductive/Obstetrics negative OB ROS                            Anesthesia Physical  Anesthesia Plan  ASA: III  Anesthesia Plan: General   Post-op Pain Management:    Induction: Intravenous  PONV Risk Score and Plan: 3 and Ondansetron and Dexamethasone  Airway Management Planned: LMA  Additional Equipment: None  Intra-op Plan:   Post-operative Plan: Extubation in OR  Informed Consent: I have reviewed the patients History and Physical, chart, labs and discussed the procedure including the risks, benefits and alternatives for the proposed anesthesia with the patient or authorized representative who has  indicated his/her understanding and acceptance.     Dental advisory given  Plan Discussed with: CRNA and Anesthesiologist  Anesthesia Plan Comments:        Anesthesia Quick Evaluation

## 2020-12-31 ENCOUNTER — Ambulatory Visit: Payer: Medicare HMO | Admitting: Oncology

## 2020-12-31 ENCOUNTER — Encounter (HOSPITAL_BASED_OUTPATIENT_CLINIC_OR_DEPARTMENT_OTHER): Payer: Self-pay | Admitting: Urology

## 2020-12-31 ENCOUNTER — Ambulatory Visit (HOSPITAL_BASED_OUTPATIENT_CLINIC_OR_DEPARTMENT_OTHER): Payer: Medicare HMO | Admitting: Anesthesiology

## 2020-12-31 ENCOUNTER — Encounter (HOSPITAL_BASED_OUTPATIENT_CLINIC_OR_DEPARTMENT_OTHER)
Admission: RE | Disposition: A | Discharge status: Home / Self Care | Payer: Self-pay | Source: Home / Self Care | Attending: Urology

## 2020-12-31 ENCOUNTER — Other Ambulatory Visit: Payer: Medicare HMO

## 2020-12-31 ENCOUNTER — Ambulatory Visit (HOSPITAL_BASED_OUTPATIENT_CLINIC_OR_DEPARTMENT_OTHER)
Admission: RE | Admit: 2020-12-31 | Discharge: 2020-12-31 | Disposition: A | Discharge status: Home / Self Care | Payer: Medicare HMO | Attending: Urology | Admitting: Urology

## 2020-12-31 DIAGNOSIS — Z8553 Personal history of malignant neoplasm of renal pelvis: Secondary | ICD-10-CM | POA: Insufficient documentation

## 2020-12-31 DIAGNOSIS — Z885 Allergy status to narcotic agent status: Secondary | ICD-10-CM | POA: Diagnosis not present

## 2020-12-31 DIAGNOSIS — E782 Mixed hyperlipidemia: Secondary | ICD-10-CM | POA: Diagnosis not present

## 2020-12-31 DIAGNOSIS — Z882 Allergy status to sulfonamides status: Secondary | ICD-10-CM | POA: Insufficient documentation

## 2020-12-31 DIAGNOSIS — Z85828 Personal history of other malignant neoplasm of skin: Secondary | ICD-10-CM | POA: Insufficient documentation

## 2020-12-31 DIAGNOSIS — Z881 Allergy status to other antibiotic agents status: Secondary | ICD-10-CM | POA: Diagnosis not present

## 2020-12-31 DIAGNOSIS — C661 Malignant neoplasm of right ureter: Secondary | ICD-10-CM | POA: Diagnosis not present

## 2020-12-31 DIAGNOSIS — R7303 Prediabetes: Secondary | ICD-10-CM | POA: Diagnosis not present

## 2020-12-31 DIAGNOSIS — Z87891 Personal history of nicotine dependence: Secondary | ICD-10-CM | POA: Diagnosis not present

## 2020-12-31 DIAGNOSIS — D0919 Carcinoma in situ of other urinary organs: Secondary | ICD-10-CM | POA: Diagnosis not present

## 2020-12-31 DIAGNOSIS — Z803 Family history of malignant neoplasm of breast: Secondary | ICD-10-CM | POA: Insufficient documentation

## 2020-12-31 DIAGNOSIS — D494 Neoplasm of unspecified behavior of bladder: Secondary | ICD-10-CM | POA: Diagnosis not present

## 2020-12-31 DIAGNOSIS — Z91040 Latex allergy status: Secondary | ICD-10-CM | POA: Diagnosis not present

## 2020-12-31 DIAGNOSIS — Z85118 Personal history of other malignant neoplasm of bronchus and lung: Secondary | ICD-10-CM | POA: Insufficient documentation

## 2020-12-31 DIAGNOSIS — Z6841 Body Mass Index (BMI) 40.0 and over, adult: Secondary | ICD-10-CM | POA: Insufficient documentation

## 2020-12-31 DIAGNOSIS — Z91041 Radiographic dye allergy status: Secondary | ICD-10-CM | POA: Insufficient documentation

## 2020-12-31 DIAGNOSIS — Z96641 Presence of right artificial hip joint: Secondary | ICD-10-CM | POA: Diagnosis not present

## 2020-12-31 DIAGNOSIS — Z923 Personal history of irradiation: Secondary | ICD-10-CM | POA: Diagnosis not present

## 2020-12-31 DIAGNOSIS — Z9221 Personal history of antineoplastic chemotherapy: Secondary | ICD-10-CM | POA: Insufficient documentation

## 2020-12-31 DIAGNOSIS — Z801 Family history of malignant neoplasm of trachea, bronchus and lung: Secondary | ICD-10-CM | POA: Diagnosis not present

## 2020-12-31 DIAGNOSIS — Z888 Allergy status to other drugs, medicaments and biological substances status: Secondary | ICD-10-CM | POA: Diagnosis not present

## 2020-12-31 DIAGNOSIS — I10 Essential (primary) hypertension: Secondary | ICD-10-CM | POA: Diagnosis not present

## 2020-12-31 HISTORY — DX: Localized edema: R60.0

## 2020-12-31 HISTORY — DX: Personal history of colon polyps, unspecified: Z86.0100

## 2020-12-31 HISTORY — DX: Personal history of antineoplastic chemotherapy: Z92.21

## 2020-12-31 HISTORY — DX: Malignant neoplasm of upper lobe, right bronchus or lung: C34.11

## 2020-12-31 HISTORY — DX: Urgency of urination: R39.15

## 2020-12-31 HISTORY — DX: Anemia due to antineoplastic chemotherapy: D64.81

## 2020-12-31 HISTORY — DX: Presence of dental prosthetic device (complete) (partial): Z97.2

## 2020-12-31 HISTORY — DX: Personal history of other malignant neoplasm of skin: Z98.890

## 2020-12-31 HISTORY — DX: Nocturia: R35.1

## 2020-12-31 HISTORY — DX: Personal history of colonic polyps: Z86.010

## 2020-12-31 HISTORY — DX: Unspecified osteoarthritis, unspecified site: M19.90

## 2020-12-31 HISTORY — PX: CYSTOSCOPY WITH URETEROSCOPY AND STENT PLACEMENT: SHX6377

## 2020-12-31 HISTORY — DX: Adverse effect of antineoplastic and immunosuppressive drugs, initial encounter: T45.1X5A

## 2020-12-31 HISTORY — DX: Personal history of other malignant neoplasm of skin: Z85.828

## 2020-12-31 HISTORY — DX: Malignant neoplasm of right renal pelvis: C65.1

## 2020-12-31 LAB — POCT I-STAT, CHEM 8
BUN: 25 mg/dL — ABNORMAL HIGH (ref 8–23)
Calcium, Ion: 1.2 mmol/L (ref 1.15–1.40)
Chloride: 106 mmol/L (ref 98–111)
Creatinine, Ser: 1.3 mg/dL — ABNORMAL HIGH (ref 0.44–1.00)
Glucose, Bld: 124 mg/dL — ABNORMAL HIGH (ref 70–99)
HCT: 40 % (ref 36.0–46.0)
Hemoglobin: 13.6 g/dL (ref 12.0–15.0)
Potassium: 4.2 mmol/L (ref 3.5–5.1)
Sodium: 140 mmol/L (ref 135–145)
TCO2: 23 mmol/L (ref 22–32)

## 2020-12-31 SURGERY — CYSTOURETEROSCOPY, WITH STENT INSERTION
Anesthesia: General | Site: Renal

## 2020-12-31 MED ORDER — PROPOFOL 10 MG/ML IV BOLUS
INTRAVENOUS | Status: DC | PRN
  Administered 2020-12-31: 150 mg via INTRAVENOUS
  Administered 2020-12-31: 50 mg via INTRAVENOUS

## 2020-12-31 MED ORDER — PHENYLEPHRINE HCL-NACL 10-0.9 MG/250ML-% IV SOLN
INTRAVENOUS | Status: DC | PRN
  Administered 2020-12-31: 50 ug/min via INTRAVENOUS

## 2020-12-31 MED ORDER — OXYBUTYNIN CHLORIDE 5 MG PO TABS: 5.0000 mg | ORAL_TABLET | Freq: Three times a day (TID) | ORAL | 1 refills | Status: DC | PRN

## 2020-12-31 MED ORDER — DEXAMETHASONE SODIUM PHOSPHATE 4 MG/ML IJ SOLN
INTRAMUSCULAR | Status: DC | PRN
  Administered 2020-12-31: 5 mg via INTRAVENOUS

## 2020-12-31 MED ORDER — CIPROFLOXACIN IN D5W 400 MG/200ML IV SOLN
400.0000 mg | Freq: Once | INTRAVENOUS | Status: AC
  Administered 2020-12-31: 400 mg via INTRAVENOUS

## 2020-12-31 MED ORDER — PHENYLEPHRINE 40 MCG/ML (10ML) SYRINGE FOR IV PUSH (FOR BLOOD PRESSURE SUPPORT)
PREFILLED_SYRINGE | INTRAVENOUS | Status: AC
  Filled 2020-12-31: qty 10

## 2020-12-31 MED ORDER — ONDANSETRON HCL 4 MG/2ML IJ SOLN
INTRAMUSCULAR | Status: DC | PRN
  Administered 2020-12-31: 4 mg via INTRAVENOUS

## 2020-12-31 MED ORDER — MIDAZOLAM HCL 2 MG/2ML IJ SOLN
INTRAMUSCULAR | Status: AC
  Filled 2020-12-31: qty 2

## 2020-12-31 MED ORDER — HYDROCODONE-ACETAMINOPHEN 5-325 MG PO TABS: 1.0000 | ORAL_TABLET | ORAL | 0 refills | Status: DC | PRN

## 2020-12-31 MED ORDER — LIDOCAINE 2% (20 MG/ML) 5 ML SYRINGE
INTRAMUSCULAR | Status: AC
  Filled 2020-12-31: qty 5

## 2020-12-31 MED ORDER — DEXAMETHASONE SODIUM PHOSPHATE 10 MG/ML IJ SOLN
INTRAMUSCULAR | Status: AC
  Filled 2020-12-31: qty 1

## 2020-12-31 MED ORDER — SODIUM CHLORIDE 0.9 % IR SOLN
Status: DC | PRN
  Administered 2020-12-31: 3000 mL

## 2020-12-31 MED ORDER — PHENAZOPYRIDINE HCL 200 MG PO TABS: 200.0000 mg | ORAL_TABLET | Freq: Three times a day (TID) | ORAL | 0 refills | Status: DC | PRN

## 2020-12-31 MED ORDER — FENTANYL CITRATE (PF) 100 MCG/2ML IJ SOLN
INTRAMUSCULAR | Status: AC
  Filled 2020-12-31: qty 2

## 2020-12-31 MED ORDER — ONDANSETRON HCL 4 MG/2ML IJ SOLN
INTRAMUSCULAR | Status: AC
  Filled 2020-12-31: qty 2

## 2020-12-31 MED ORDER — LIDOCAINE 2% (20 MG/ML) 5 ML SYRINGE
INTRAMUSCULAR | Status: DC | PRN
  Administered 2020-12-31: 60 mg via INTRAVENOUS

## 2020-12-31 MED ORDER — ACETAMINOPHEN 500 MG PO TABS
1000.0000 mg | ORAL_TABLET | Freq: Once | ORAL | Status: AC
  Administered 2020-12-31: 1000 mg via ORAL

## 2020-12-31 MED ORDER — CIPROFLOXACIN IN D5W 400 MG/200ML IV SOLN
INTRAVENOUS | Status: AC
  Filled 2020-12-31: qty 200

## 2020-12-31 MED ORDER — FENTANYL CITRATE (PF) 100 MCG/2ML IJ SOLN: 25.0000 ug | INTRAMUSCULAR | Status: DC | PRN

## 2020-12-31 MED ORDER — PHENYLEPHRINE 40 MCG/ML (10ML) SYRINGE FOR IV PUSH (FOR BLOOD PRESSURE SUPPORT)
PREFILLED_SYRINGE | INTRAVENOUS | Status: DC | PRN
  Administered 2020-12-31 (×5): 80 ug via INTRAVENOUS

## 2020-12-31 MED ORDER — ONDANSETRON HCL 4 MG/2ML IJ SOLN: 4.0000 mg | Freq: Once | INTRAMUSCULAR | Status: DC | PRN

## 2020-12-31 MED ORDER — FENTANYL CITRATE (PF) 100 MCG/2ML IJ SOLN
INTRAMUSCULAR | Status: DC | PRN
  Administered 2020-12-31 (×2): 50 ug via INTRAVENOUS

## 2020-12-31 MED ORDER — PROPOFOL 500 MG/50ML IV EMUL
INTRAVENOUS | Status: AC
  Filled 2020-12-31: qty 50

## 2020-12-31 MED ORDER — ACETAMINOPHEN 500 MG PO TABS
ORAL_TABLET | ORAL | Status: AC
  Filled 2020-12-31: qty 2

## 2020-12-31 MED ORDER — DROPERIDOL 2.5 MG/ML IJ SOLN: 0.6250 mg | Freq: Once | INTRAMUSCULAR | Status: DC | PRN

## 2020-12-31 MED ORDER — IOHEXOL 300 MG/ML  SOLN
INTRAMUSCULAR | Status: DC | PRN
  Administered 2020-12-31: 11 mL via URETHRAL

## 2020-12-31 MED ORDER — CIPROFLOXACIN HCL 500 MG PO TABS: 500.0000 mg | ORAL_TABLET | Freq: Two times a day (BID) | ORAL | 0 refills | Status: AC

## 2020-12-31 MED ORDER — SODIUM CHLORIDE 0.9 % IV SOLN: INTRAVENOUS | Status: DC

## 2020-12-31 SURGICAL SUPPLY — 27 items
APL SKNCLS STERI-STRIP NONHPOA (GAUZE/BANDAGES/DRESSINGS)
BAG DRAIN URO-CYSTO SKYTR STRL (DRAIN) ×2 IMPLANT
BAG DRN UROCATH (DRAIN) ×1
BASKET STONE 1.7 NGAGE (UROLOGICAL SUPPLIES) IMPLANT
BASKET ZERO TIP NITINOL 2.4FR (BASKET) ×1 IMPLANT
BENZOIN TINCTURE PRP APPL 2/3 (GAUZE/BANDAGES/DRESSINGS) IMPLANT
BSKT STON RTRVL ZERO TP 2.4FR (BASKET)
CATH URET 5FR 28IN OPEN ENDED (CATHETERS) ×1 IMPLANT
CLOTH BEACON ORANGE TIMEOUT ST (SAFETY) ×2 IMPLANT
FIBER LASER FLEXIVA 365 (UROLOGICAL SUPPLIES) IMPLANT
GLOVE SURG ENC MOIS LTX SZ7.5 (GLOVE) ×2 IMPLANT
GOWN STRL REUS W/TWL XL LVL3 (GOWN DISPOSABLE) ×2 IMPLANT
GUIDEWIRE STR DUAL SENSOR (WIRE) IMPLANT
GUIDEWIRE ZIPWRE .038 STRAIGHT (WIRE) ×3 IMPLANT
INFUSOR MANOMETER BAG 3000ML (MISCELLANEOUS) ×1 IMPLANT
IV NS IRRIG 3000ML ARTHROMATIC (IV SOLUTION) ×4 IMPLANT
KIT TURNOVER CYSTO (KITS) ×2 IMPLANT
MANIFOLD NEPTUNE II (INSTRUMENTS) ×2 IMPLANT
NS IRRIG 500ML POUR BTL (IV SOLUTION) ×2 IMPLANT
PACK CYSTO (CUSTOM PROCEDURE TRAY) ×2 IMPLANT
STENT URET 6FRX24 CONTOUR (STENTS) ×1 IMPLANT
STRIP CLOSURE SKIN 1/2X4 (GAUZE/BANDAGES/DRESSINGS) IMPLANT
SYR 10ML LL (SYRINGE) ×2 IMPLANT
TRACTIP FLEXIVA PULS ID 200XHI (Laser) IMPLANT
TRACTIP FLEXIVA PULSE ID 200 (Laser) ×2
TUBE CONNECTING 12X1/4 (SUCTIONS) ×1 IMPLANT
TUBING UROLOGY SET (TUBING) ×1 IMPLANT

## 2020-12-31 NOTE — Anesthesia Procedure Notes (Signed)
Procedure Name: LMA Insertion Date/Time: 12/31/2020 8:46 AM Performed by: Lieutenant Diego, CRNA Pre-anesthesia Checklist: Patient identified, Emergency Drugs available, Suction available and Patient being monitored Patient Re-evaluated:Patient Re-evaluated prior to induction Oxygen Delivery Method: Circle system utilized Preoxygenation: Pre-oxygenation with 100% oxygen Induction Type: IV induction Ventilation: Mask ventilation without difficulty LMA: LMA inserted LMA Size: 4.0 Number of attempts: 1 Placement Confirmation: positive ETCO2 and breath sounds checked- equal and bilateral Tube secured with: Tape Dental Injury: Teeth and Oropharynx as per pre-operative assessment

## 2020-12-31 NOTE — Transfer of Care (Signed)
Immediate Anesthesia Transfer of Care Note  Patient: Maria Marquez  Procedure(s) Performed: CYSTOSCOPY WITH RIGHT URETEROSCOPY/ BILATERAL RETROGRADE PYELOGRAM/RIGHT URETERAL BIOPSY/ LASER ABLATION/RIGHT STENT PLACEMENT (N/A Renal)  Patient Location: PACU  Anesthesia Type:General  Level of Consciousness: awake  Airway & Oxygen Therapy: Patient Spontanous Breathing and Patient connected to face mask oxygen  Post-op Assessment: Report given to RN and Post -op Vital signs reviewed and stable  Post vital signs: Reviewed and stable  Last Vitals:  Vitals Value Taken Time  BP 171/72 12/31/20 0934  Temp    Pulse 70 12/31/20 0935  Resp 16 12/31/20 0935  SpO2 100 % 12/31/20 0935  Vitals shown include unvalidated device data.  Last Pain:  Vitals:   12/31/20 0707  TempSrc: Oral  PainSc: 0-No pain      Patients Stated Pain Goal: 7 (56/38/75 6433)  Complications: No complications documented.

## 2020-12-31 NOTE — H&P (Signed)
Urology Preoperative H&P   Chief Complaint: Right renal pelvis mass  History of Present Illness: Maria Marquez is a 75 y.o. female with female, referred by Dr. Matilde Sprang after she was found to have a 2.7 cm solid and enhancing lesion in the proximal aspects of the right ureter on CT during a work-up for gross hematuria. She was also noted to have a 13 mm left renal lesion, likely representing a proteinaceous/hyperdense cyst. She is s/p diagnostic right ureteroscopy on 04/18/20 and was found to have multiple large papillary tumors throughout the right renal pelvis/UPJ. 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 She is s/p gem/cis chemotherapy and radiation to the thorax.   She has completed chemotherapy and is near completion of radiation therapy for her primary lung cancer. PET CT from January revealed no intra-abdominal lesions and no GU abnormalities. She is urinating without difficulty and denies interval UTIs, dysuria or hematuria. Of note, since starting chemotherapy, her renal function has progressively declined with her last eGFR being 38 (serum creatinine- 1.45).  Past Medical History:  Diagnosis Date  . Anemia associated with chemotherapy    followed by dr Alen Blew  . Bilateral lower extremity edema    per pt occasionally ankles swell  . Carcinoma of renal pelvis, right [facility]) urologist--- dr Veroncia Jezek/  oncologist--- dr Alen Blew   dx 08/ 2021 ;  chemo 10/ 2021  to 12/ 2021  . Chronic kidney disease, stage 3a ([place]) 12/31/2019  . Dyspnea    12-26-2020  per pt no issue most of the time  . GERD (gastroesophageal reflux disease)    per pt occasionally takes tums  . History of basal cell carcinoma (BCC) excision    per pt in 1970s on nose  . History of chemotherapy 06-13-2020  to 12/ 2021   right renal pelvis carcinoma  . History of colon polyps   . History of radiation therapy  11/13/20-11/26/20   Right Lung- SBRT- Dr. Gery Pray   . HLD (hyperlipidemia)   . Hypertension    followed by pcp  . Hypothyroidism, postsurgical 1979   followed by pcp  . Leukocytosis   . Nocturia   . OA (osteoarthritis)   . PONV (postoperative nausea and vomiting)   . Pre-diabetes   . Primary adenocarcinoma of upper lobe of right lung [facility]) oncologist--- dr shadad/  pulmonology-- dr byrum   dx 02/ 2022;  s/p  SBRT 11-13-2020 to 11-26-2020  . Urgency of urination   . Wears partial dentures    lower    Past Surgical History:  Procedure Laterality Date  . COLONOSCOPY  last one 2017  . CYSTOSCOPY WITH URETEROSCOPY Right 04/18/2020   Procedure: CYSTOSCOPY WITH URETEROSCOPY, BIOPSY;  Surgeon: Ceasar Mons, MD;  Location: [facility];  Service: Urology;  Laterality: Right;  ONLY NEEDS 45 MIN  . EYE SURGERY Bilateral 1987   tear ducts  . IR IMAGING GUIDED PORT INSERTION  06/04/2020  . THYROID LOBECTOMY Right 1979  . TOTAL HIP ARTHROPLASTY Right 01/28/2016   Procedure: RIGHT TOTAL HIP ARTHROPLASTY ANTERIOR APPROACH;  Surgeon: [name], MD;  Location: [facility];  Service: Orthopedics;  Laterality: Right;  . UMBILICAL HERNIA REPAIR  1980s  . VIDEO BRONCHOSCOPY WITH ENDOBRONCHIAL NAVIGATION N/A 06/06/2020   Procedure: VIDEO BRONCHOSCOPY WITH ENDOBRONCHIAL NAVIGATION;  Surgeon: Collene Gobble, MD;  Location: [facility];  Service: Thoracic;  Laterality: N/A;  . VIDEO BRONCHOSCOPY WITH  ENDOBRONCHIAL NAVIGATION N/A 10/10/2020   Procedure: VIDEO BRONCHOSCOPY WITH ENDOBRONCHIAL NAVIGATION;  Surgeon: [name], MD;  Location: [facility];  Service: Thoracic;  Laterality: N/A;    Allergies:  Allergies  Allergen Reactions  . Iodine Shortness Of Breath  . Keflex [Cephalexin] Shortness Of Breath, Rash and Tinitus  . Shellfish Allergy Hives  . Lasix [Furosemide] Itching  . Rosuvastatin Other (See Comments)    Muscle Pain in Thighs  . Latex Rash  . Lisinopril Cough  . Sulfa  Antibiotics Nausea Only  . Tramadol Itching    Family History  Problem Relation Age of Onset  . Arthritis Mother   . Breast cancer Mother   . Schizophrenia Mother   . Diabetes Father   . Heart disease Father   . Kidney disease Father   . Stroke Sister   . Lung cancer Brother   . Heart disease Brother   . Lung cancer Brother   . Heart disease Brother   . Vision loss Brother        Glaucoma  . Alcohol abuse Brother     Social History:  reports that she quit smoking about 8 months ago. Her smoking use included cigarettes. She has a 45.00 pack-year smoking history. She has quit using smokeless tobacco. She reports previous alcohol use. She reports that she does not use drugs.  ROS: A complete review of systems was performed.  All systems are negative except for pertinent findings as noted.  Physical Exam:  Vital signs in last 24 hours: Temp:  [98 F (36.7 C)] 98 F (36.7 C) (05/11 0707) Pulse Rate:  [80] 80 (05/11 0707) Resp:  [18] 18 (05/11 0707) BP: (167)/(77) 167/77 (05/11 0707) SpO2:  [97 %] 97 % (05/11 0707) Weight:  [321 kg] 116 kg (05/11 0707) Constitutional:  Alert and oriented, No acute distress Cardiovascular: Regular rate and rhythm, No JVD Respiratory: Normal respiratory effort, Lungs clear bilaterally GI: Abdomen is soft, nontender, nondistended, no abdominal masses GU: No CVA tenderness Lymphatic: No lymphadenopathy Neurologic: Grossly intact, no focal deficits Psychiatric: Normal mood and affect  Laboratory Data:  Recent Labs    12/31/20 0733  HGB 13.6  HCT 40.0    Recent Labs    12/31/20 0733  NA 140  K 4.2  CL 106  GLUCOSE 124*  BUN 25*  CREATININE 1.30*     Results for orders placed or performed during the hospital encounter of 12/31/20 (from the past 24 hour(s))  I-STAT, chem 8     Status: Abnormal   Collection Time: 12/31/20  7:33 AM  Result Value Ref Range   Sodium 140 135 - 145 mmol/L   Potassium 4.2 3.5 - 5.1 mmol/L   Chloride  106 98 - 111 mmol/L   BUN 25 (H) 8 - 23 mg/dL   Creatinine, Ser 1.30 (H) 0.44 - 1.00 mg/dL   Glucose, Bld 124 (H) 70 - 99 mg/dL   Calcium, Ion 1.20 1.15 - 1.40 mmol/L   TCO2 23 22 - 32 mmol/L   Hemoglobin 13.6 12.0 - 15.0 g/dL   HCT 40.0 36.0 - 46.0 %   Recent Results (from the past 240 hour(s))  SARS CORONAVIRUS 2 (TAT 6-24 HRS) Nasopharyngeal Nasopharyngeal Swab     Status: None   Collection Time: 12/29/20  1:18 PM   Specimen: Nasopharyngeal Swab  Result Value Ref Range Status   SARS Coronavirus 2 NEGATIVE NEGATIVE Final    Comment: (NOTE) SARS-CoV-2 target nucleic acids are NOT DETECTED.  The  SARS-CoV-2 RNA is generally detectable in upper and lower respiratory specimens during the acute phase of infection. Negative results do not preclude SARS-CoV-2 infection, do not rule out co-infections with other pathogens, and should not be used as the sole basis for treatment or other patient management decisions. Negative results must be combined with clinical observations, patient history, and epidemiological information. The expected result is Negative.  Fact Sheet for Patients: SugarRoll.be  Fact Sheet for Healthcare Providers: https://www.woods-mathews.com/  This test is not yet approved or cleared by the [place] FDA and  has been authorized for detection and/or diagnosis of SARS-CoV-2 by FDA under an Emergency Use Authorization (EUA). This EUA will remain  in effect (meaning this test can be used) for the duration of the COVID-19 declaration under Se ction 564(b)(1) of the Act, 21 U.S.C. section 360bbb-3(b)(1), unless the authorization is terminated or revoked sooner.  Performed at [facility], [place] [address], [place], [place] [address]     Renal Function: Recent Labs    12/31/20 7517  CREATININE 1.30*   Estimated Creatinine Clearance: 50.6 mL/min (A) (by C-G formula based on SCr of 1.3 mg/dL (H)).  Radiologic  Imaging: CLINICAL DATA: Subsequent treatment strategy for pulmonary nodule.  History of urothelial epithelial carcinoma   EXAM:  NUCLEAR MEDICINE PET SKULL BASE TO THIGH   TECHNIQUE:  12.7 mCi F-18 FDG was injected intravenously. Full-ring PET imaging  was performed from the skull base to thigh after the radiotracer. CT  data was obtained and used for attenuation correction and anatomic  localization.   Fasting blood glucose: 120 mg/dl   COMPARISON: PET-CT 10 1121, super D chest 06/04/2020   FINDINGS:  Mediastinal blood pool activity: SUV max 3.23   Liver activity: SUV max NA   NECK: No hypermetabolic lymph nodes in the neck.   Incidental CT findings: none   CHEST: RIGHT upper lobe pulmonary nodule of concern is decreased in  central density compared to prior. Previously the soft tissues  central component measures 16 x 12 mm on PET CT 06/02/2020. Nowthe a  solid component is less well-defined centrally measuring  approximately 10 mm x 11 mm. (Image 60/4).   Additionally, metabolic activity of this RIGHT upper lobe nodule is  significantly decreased with SUV max equal not 2.9 decreased from  SUV max equal 11.1.   Resolution of hypermetabolic paratracheal lymph node. No enlarged  mediastinal lymph nodes are present.   No new pulmonary nodularity.   Incidental CT findings: none   ABDOMEN/PELVIS: No evidence of malignancy within the pelvis. No  hypermetabolic pelvic lymph nodes.   No evidence of liver metastasis or periaortic adenopathy.   Incidental CT findings: none   SKELETON: No aggressive osseous lesion.   Incidental CT findings: none   IMPRESSION:  1. Interval decrease in volume and metabolic activity of the RIGHT  upper lobe pulmonary nodule.  2. No residual hypermetabolic mediastinal lymph nodes.  3. No evidence of malignancy in the abdomen pelvis.    Electronically Signed  By: Suzy Bouchard M.D.  On: 09/19/2020 11:18  I independently reviewed  the above imaging studies.  Assessment and Plan Maria Marquez is a 75 y.o. 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 Risks include, but are not limited to,  bleeding, urinary tract infection, ureteral stricture formation, renal loss, MI, CVA, DVT, PE and the inherent risk of general anesthesia. She voices understanding and wishes to proceed.    Ellison Hughs, MD 12/31/2020, 8:18 AM  [facility] Pager: [phone]

## 2020-12-31 NOTE — Anesthesia Postprocedure Evaluation (Signed)
Anesthesia Post Note  Patient: Maria Marquez  Procedure(s) Performed: CYSTOSCOPY WITH RIGHT URETEROSCOPY/ BILATERAL RETROGRADE PYELOGRAM/RIGHT URETERAL BIOPSY/ LASER ABLATION/RIGHT STENT PLACEMENT (N/A Renal)     Patient location during evaluation: PACU Anesthesia Type: General Level of consciousness: awake Pain management: pain level controlled Vital Signs Assessment: post-procedure vital signs reviewed and stable Respiratory status: spontaneous breathing and respiratory function stable Cardiovascular status: stable Postop Assessment: no apparent nausea or vomiting Anesthetic complications: no   No complications documented.  Last Vitals:  Vitals:   12/31/20 1000 12/31/20 1005  BP:    Pulse: 68 62  Resp: 17 15  Temp:    SpO2: 100% 97%    Last Pain:  Vitals:   12/31/20 1005  TempSrc:   PainSc: 0-No pain                 Merlinda Frederick

## 2020-12-31 NOTE — Op Note (Signed)
Operative Note  Preoperative diagnosis:  1.  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  History of T1 adenocarcinoma of the right lung  Postoperative diagnosis: 1.  5 mm papillary bladder tumor with features concerning for urothelial cell carcinoma involving the proximal aspects of the right ureter 2.  History of T1 adenocarcinoma the right lung  Procedure(s): 1.  Cystoscopy with right ureteroscopy, right ureteral mass biopsy, right ureteral laser tumor ablation and right ureteral stent placement 2.  Bilateral retrograde pyelograms with intraoperative interpretation of fluoroscopic imaging  Surgeon: Ellison Hughs, MD  Assistants:  None  Anesthesia:  General  Complications:  None  EBL: Less than 5 mL  Specimens: 1.  Right ureteral tumor biopsy  Drains/Catheters: 1.  Right 6 French, 24 cm JJ stent without tether  Intraoperative findings:   1. Right retrograde pyelogram revealed a small filling defect within the proximal aspects of the right ureter.  The remaining ureter and right renal pelvis exhibited no evidence of any filling defects or dilation. 2. Solitary left collecting system with no filling defects or dilation involving the left ureter or left renal pelvis seen on retrograde pyelogram 3. 5 mm papillary bladder tumor involving the proximal aspect of the right ureter with features concerning for urothelial cell carcinoma  Indication:  Maria Marquez is a 75 y.o. female with a history of a 2.7 cm solid and enhancing lesion in the proximal aspects of the right ureter that was initially diagnosed via ureteroscopy with brush biopsy as a high-grade urothelial cell carcinoma in August 2021.  During her initial staging, she was also found to have a right lung lesion that was eventually diagnosed as a T1 adenocarcinoma.  She has since completed gemcitabine/cisplatin chemotherapy and radiation to the thorax to treat her primary lung cancer.  Her  follow-up PET CT from January 2022 revealed no intra-abdominal lesions or any GU abnormalities.  She is here for a diagnostic ureteroscopy to further evaluate her previously diagnosed proximal right ureteral urothelial cell carcinoma.  She has been consented for the above procedures, voices understanding and wishes to proceed.  Description of procedure:  After informed consent was obtained, the patient was brought to the operating room and general LMA anesthesia was administered. The patient was then placed in the dorsolithotomy position and prepped and draped in the usual sterile fashion. A timeout was performed. A 23 French rigid cystoscope was then inserted into the urethral meatus and advanced into the bladder under direct vision. A complete bladder survey revealed no intravesical pathology.  A 5 French ureteral catheter was then inserted into the right ureteral orifice and a retrograde pyelogram was obtained, with the findings listed above.  A Glidewire was then used to intubate the lumen of the ureteral catheter and was advanced up to the right renal pelvis, under fluoroscopic guidance.  The catheter was then removed, leaving the wire in place.  A 5 French ureteral catheter was then inserted into the left ureteral orifice and a retrograde pyelogram was obtained, with the findings listed above.  A Glidewire was then used to intubate the lumen of the ureteral catheter and was advanced up to the left renal pelvis, under fluoroscopic guidance.    A flexible ureteroscope was then advanced up the right ureter alongside the Glidewire.  I found a 5 mm papillary tumor in the proximal aspects of the right ureter.  Biopsy forceps were then used to obtain tissue sampling of the ureteral mass.  The tissue specimens were  sent to pathology for permanent section.  A 200 m holmium laser was then used to ablate the tumor down to its base.  A complete inspection of the right renal pelvis revealed no luminal  abnormalities or additional tumor burden.  The flexible ureteroscope was then removed under direct vision, leaving the wire in place.  A 6 French, 24 cm JJ stent was then placed over the wire and into good position within the right collecting system, confirming placement via fluoroscopy.  Her bladder was then drained.  She tolerated the procedure well and was transferred to the postanesthesia in stable condition.  Plan: Follow-up in 2 weeks for stent removal and to discuss pathology results.

## 2020-12-31 NOTE — Discharge Instructions (Signed)

## 2021-01-01 ENCOUNTER — Encounter (HOSPITAL_BASED_OUTPATIENT_CLINIC_OR_DEPARTMENT_OTHER): Payer: Self-pay | Admitting: Urology

## 2021-01-01 LAB — SURGICAL PATHOLOGY

## 2021-01-06 ENCOUNTER — Encounter: Payer: Self-pay | Admitting: Internal Medicine

## 2021-01-06 MED ORDER — EZETIMIBE 10 MG PO TABS
10.0000 mg | ORAL_TABLET | Freq: Every day | ORAL | 0 refills | Status: DC
Start: 2021-01-06 — End: 2021-03-20

## 2021-01-07 ENCOUNTER — Other Ambulatory Visit: Payer: Self-pay

## 2021-01-07 ENCOUNTER — Inpatient Hospital Stay: Payer: Medicare HMO | Attending: Oncology | Admitting: Oncology

## 2021-01-07 ENCOUNTER — Inpatient Hospital Stay: Payer: Medicare HMO

## 2021-01-07 VITALS — BP 154/70 | HR 66 | Temp 97.9°F | Resp 17 | Ht 66.0 in | Wt 256.9 lb

## 2021-01-07 DIAGNOSIS — Z85118 Personal history of other malignant neoplasm of bronchus and lung: Secondary | ICD-10-CM | POA: Insufficient documentation

## 2021-01-07 DIAGNOSIS — C659 Malignant neoplasm of unspecified renal pelvis: Secondary | ICD-10-CM | POA: Diagnosis not present

## 2021-01-07 DIAGNOSIS — Z8553 Personal history of malignant neoplasm of renal pelvis: Secondary | ICD-10-CM | POA: Diagnosis not present

## 2021-01-07 DIAGNOSIS — D649 Anemia, unspecified: Secondary | ICD-10-CM | POA: Diagnosis not present

## 2021-01-07 NOTE — Progress Notes (Signed)
Hematology and Oncology Follow Up   Maria Marquez [id number] 25-Mar-1946 75 y.o. 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      Secondary diagnosis: T1N0 adenocarcinoma of the right upper lobe of the lung confirmed in February 2022.     Prior Therapy:   She is status post cystoscopy and urethroscopy and a biopsy of her renal pelvic mass completed by Dr. Lovena Neighbours in April 18, 2020.  This showed high-grade urothelial carcinoma.  She is status post bronchoscopy and a biopsy of her right upper lung mass which was not diagnostic.  Gemcitabine and cisplatin chemotherapy started on June 13, 2020.  She completed 3 cycles and cycle 4 was given without cisplatin completed in December 2021.  She is status post bronchoscopy and a biopsy completed by Dr. Roxan Hockey on October 10, 2020 which confirmed the presence of primary lung neoplasm.  SBRT to right lung cancer between March 24 to November 26, 2020.  She received total of 50 Gray and 10 fractions.  She is status post repeat cystoscopy in and biopsy as well as complete laser tumor ablation completed on Dec 31, 2020.  Final pathology showed low-grade urothelial carcinoma.  This without muscle invasive disease.  Current therapy: Active surveillance.  Interim History: Maria Marquez is here for a follow-up evaluation.  Since her last visit, she underwent cystoscopy and a right ureteroscopy and ureteral laser ablation and right stent placement completed on Dec 31, 2020.  The final pathology showed a noninvasive muscle urothelial carcinoma.  Clinically, she reports feeling reasonably well without any major complaints.  She has reported no urinary difficulties at this time including frequency urgency or hesitancy.  She denies any hematuria or dysuria.  She is reporting any  improvement in her fatigue and tiredness without any worsening dyspnea on exertion.  .    Medications: Unchanged on review. Current Outpatient Medications  Medication Sig Dispense Refill  . acetaminophen (TYLENOL) 500 MG tablet Take 500-1,500 mg by mouth every 8 (eight) hours as needed for moderate pain.    [name] albuterol (VENTOLIN HFA) 108 (90 Base) MCG/ACT inhaler Inhale 1-2 puffs into the lungs every 6 (six) hours as needed for wheezing or shortness of breath. 18 g 0  . aspirin EC 81 MG tablet Take 81 mg by mouth daily. Swallow whole. (Patient not taking: Reported on 12/29/2020)    . calcium carbonate (TUMS - DOSED IN MG ELEMENTAL CALCIUM) 500 MG chewable tablet Chew 500 mg by mouth daily as needed for indigestion or heartburn.    [name] EPINEPHrine 0.3 mg/0.3 mL IJ SOAJ injection Inject 0.3 mg into the muscle as needed for anaphylaxis. 1 each 2  . estradiol-norethindrone (ACTIVELLA) 1-0.5 MG tablet Take 1 tablet by mouth daily. (Patient taking differently: Take 1 tablet by mouth as directed. Per pt takes every 3rd day) 28 tablet 0  . ezetimibe (ZETIA) 10 MG tablet Take 1 tablet (10 mg total) by mouth daily. 90 tablet 0  . HYDROcodone-acetaminophen (NORCO) 5-325 MG tablet Take 1 tablet by mouth every 4 (four) hours as needed for moderate pain. 20 tablet 0  . levothyroxine (SYNTHROID) 125 MCG tablet Take 1 tablet (125 mcg total) by mouth at bedtime. (Patient taking differently: Take 125 mcg by mouth at bedtime.) 90 tablet 1  . lidocaine-prilocaine (EMLA) cream Apply 1 application topically as needed. (Patient taking differently: Apply 1  application topically as needed (port access).) 30 g 0  . losartan-hydrochlorothiazide (HYZAAR) 50-12.5 MG tablet TAKE 1 TABLET EVERY DAY (Patient taking differently: Take 1 tablet by mouth daily.) 90 tablet 3  . oxybutynin (DITROPAN) 5 MG tablet Take 1 tablet (5 mg total) by mouth every 8 (eight) hours as needed for bladder spasms. 30 tablet 1  . phenazopyridine  (PYRIDIUM) 200 MG tablet Take 1 tablet (200 mg total) by mouth 3 (three) times daily as needed (for pain with urination). 30 tablet 0  . XIIDRA 5 % SOLN Place 1 drop into both eyes 2 (two) times daily as needed (dry eyes).     No current facility-administered medications for this visit.     Allergies:  Allergies  Allergen Reactions  . Iodine Shortness Of Breath  . Keflex [Cephalexin] Shortness Of Breath, Rash and Tinitus  . Shellfish Allergy Hives  . Lasix [Furosemide] Itching  . Rosuvastatin Other (See Comments)    Muscle Pain in Thighs  . Latex Rash  . Lisinopril Cough  . Sulfa Antibiotics Nausea Only  . Tramadol Itching   Physical exam:  Blood pressure (!) 154/70, pulse 66, temperature 97.9 F (36.6 C), temperature source Tympanic, resp. rate 17, height 5\' 6"  (1.676 m), weight 256 lb 14.4 oz (116.5 kg), SpO2 99 %.   ECOG 1    General appearance: Alert, awake without any distress. Head: Atraumatic without abnormalities Oropharynx: Without any thrush or ulcers. Eyes: No scleral icterus. Lymph nodes: No lymphadenopathy noted in the cervical, supraclavicular, or axillary nodes Heart:regular rate and rhythm, without any murmurs or gallops.   Lung: Clear to auscultation without any rhonchi, wheezes or dullness to percussion. Abdomin: Soft, nontender without any shifting dullness or ascites. Musculoskeletal: No clubbing or cyanosis. Neurological: No motor or sensory deficits. Skin: No rashes or lesions.     Lab Results: Lab Results  Component Value Date   WBC 7.2 11/13/2020   HGB 13.6 12/31/2020   HCT 40.0 12/31/2020   MCV 95.2 11/13/2020   PLT 233 11/13/2020     Chemistry      Component Value Date/Time   NA 140 12/31/2020 0733   NA 140 12/19/2019 1132   K 4.2 12/31/2020 0733   CL 106 12/31/2020 0733   CO2 24 11/13/2020 0916   BUN 25 (H) 12/31/2020 0733   BUN 25 12/19/2019 1132   CREATININE 1.30 (H) 12/31/2020 0733   CREATININE 1.45 (H) 11/13/2020 0916    GLU 92 02/04/2016 0000      Component Value Date/Time   CALCIUM 8.6 (L) 11/13/2020 0916   ALKPHOS 69 11/13/2020 0916   AST 13 (L) 11/13/2020 0916   ALT 10 11/13/2020 0916   BILITOT 0.5 11/13/2020 0916        Impression and Plan:   75 year old woman with  1.    Urothelial carcinoma of the right renal pelvis diagnosed in August 2021.  She initially presented with T2N0 high-grade urothelial carcinoma and subsequently found to have a very low grade noninvasive tumor.   Her disease status was updated at this time and treatment options moving forward were discussed.  Given her recent cystoscopy with noninvasive tumor I do not recommend any additional systemic therapy.  Repeat cystoscopy versus surgical resection were discussed.  I recommended updating her staging scan in August of 2022.  Additional systemic therapy will be used if any metastatic disease is detected in the future.    2.  Stage IA adenocarcinoma of the lung confirmed in February 2022.  She is status post radiation therapy without any residual need for treatment at this time.  This will be monitored on future imaging studies.   3. Renal function surveillance:Kidney function on Dec 31, 2020 showed a creatinine of 1.3 which is close to her baseline at this time.  4.  Port-A-Cath management: Risks and benefits of removing her Port-A-Cath were discussed at this time.  This will be flushed periodically.  5.  Anemia: Continues to improve with hemoglobin up to 13.6 from Dec 31, 2020.  6. Follow-up:  Will be in 3 months for repeat evaluation and repeat imaging studies.  30  minutes were spent on this encounter.  The time was dedicated to reviewing disease status, reviewing pathology results and outlining future plan of care.  Zola Button, MD 01/07/2021 2:55 PM

## 2021-01-08 ENCOUNTER — Ambulatory Visit
Admission: RE | Admit: 2021-01-08 | Discharge: 2021-01-08 | Disposition: A | Discharge status: Home / Self Care | Payer: Medicare HMO | Source: Ambulatory Visit | Attending: Internal Medicine | Admitting: Internal Medicine

## 2021-01-08 DIAGNOSIS — Z1231 Encounter for screening mammogram for malignant neoplasm of breast: Secondary | ICD-10-CM

## 2021-01-16 DIAGNOSIS — R351 Nocturia: Secondary | ICD-10-CM | POA: Diagnosis not present

## 2021-01-16 DIAGNOSIS — R3915 Urgency of urination: Secondary | ICD-10-CM | POA: Diagnosis not present

## 2021-01-16 DIAGNOSIS — R310 Gross hematuria: Secondary | ICD-10-CM | POA: Diagnosis not present

## 2021-01-16 DIAGNOSIS — C651 Malignant neoplasm of right renal pelvis: Secondary | ICD-10-CM | POA: Diagnosis not present

## 2021-01-26 ENCOUNTER — Other Ambulatory Visit: Payer: Self-pay | Admitting: Urology

## 2021-01-27 ENCOUNTER — Inpatient Hospital Stay: Payer: Medicare HMO | Attending: Oncology

## 2021-01-27 ENCOUNTER — Other Ambulatory Visit: Payer: Self-pay

## 2021-01-27 DIAGNOSIS — Z452 Encounter for adjustment and management of vascular access device: Secondary | ICD-10-CM | POA: Diagnosis not present

## 2021-01-27 DIAGNOSIS — Z8553 Personal history of malignant neoplasm of renal pelvis: Secondary | ICD-10-CM | POA: Diagnosis not present

## 2021-01-27 DIAGNOSIS — C659 Malignant neoplasm of unspecified renal pelvis: Secondary | ICD-10-CM

## 2021-01-27 DIAGNOSIS — Z95828 Presence of other vascular implants and grafts: Secondary | ICD-10-CM

## 2021-01-27 LAB — CMP (CANCER CENTER ONLY)
ALT: 15 U/L (ref 0–44)
AST: 16 U/L (ref 15–41)
Albumin: 3.6 g/dL (ref 3.5–5.0)
Alkaline Phosphatase: 63 U/L (ref 38–126)
Anion gap: 9 (ref 5–15)
BUN: 26 mg/dL — ABNORMAL HIGH (ref 8–23)
CO2: 25 mmol/L (ref 22–32)
Calcium: 8.8 mg/dL — ABNORMAL LOW (ref 8.9–10.3)
Chloride: 105 mmol/L (ref 98–111)
Creatinine: 1.48 mg/dL — ABNORMAL HIGH (ref 0.44–1.00)
GFR, Estimated: 37 mL/min — ABNORMAL LOW (ref >60)
Glucose, Bld: 98 mg/dL (ref 70–99)
Potassium: 4.1 mmol/L (ref 3.5–5.1)
Sodium: 139 mmol/L (ref 135–145)
Total Bilirubin: 0.6 mg/dL (ref 0.3–1.2)
Total Protein: 6.7 g/dL (ref 6.5–8.1)

## 2021-01-27 LAB — CBC WITH DIFFERENTIAL (CANCER CENTER ONLY)
Abs Immature Granulocytes: 0.01 10*3/uL (ref 0.00–0.07)
Basophils Absolute: 0 10*3/uL (ref 0.0–0.1)
Basophils Relative: 1 %
Eosinophils Absolute: 0.2 10*3/uL (ref 0.0–0.5)
Eosinophils Relative: 4 %
HCT: 36.2 % (ref 36.0–46.0)
Hemoglobin: 11.8 g/dL — ABNORMAL LOW (ref 12.0–15.0)
Immature Granulocytes: 0 %
Lymphocytes Relative: 22 %
Lymphs Abs: 1.3 10*3/uL (ref 0.7–4.0)
MCH: 29.4 pg (ref 26.0–34.0)
MCHC: 32.6 g/dL (ref 30.0–36.0)
MCV: 90.3 fL (ref 80.0–100.0)
Monocytes Absolute: 0.6 10*3/uL (ref 0.1–1.0)
Monocytes Relative: 9 %
Neutro Abs: 3.8 10*3/uL (ref 1.7–7.7)
Neutrophils Relative %: 64 %
Platelet Count: 197 10*3/uL (ref 150–400)
RBC: 4.01 MIL/uL (ref 3.87–5.11)
RDW: 14.6 % (ref 11.5–15.5)
WBC Count: 5.9 10*3/uL (ref 4.0–10.5)
nRBC: 0 % (ref 0.0–0.2)

## 2021-01-27 MED ORDER — SODIUM CHLORIDE 0.9% FLUSH
10.0000 mL | Freq: Once | INTRAVENOUS | Status: AC
  Administered 2021-01-27: 10 mL
  Filled 2021-01-27: qty 10

## 2021-01-27 MED ORDER — HEPARIN SOD (PORK) LOCK FLUSH 100 UNIT/ML IV SOLN
500.0000 [IU] | Freq: Once | INTRAVENOUS | Status: AC
Start: 2021-01-27 — End: 2021-01-27
  Administered 2021-01-27: 500 [IU]
  Filled 2021-01-27: qty 5

## 2021-01-28 ENCOUNTER — Encounter: Payer: Self-pay | Admitting: Internal Medicine

## 2021-01-28 DIAGNOSIS — Z79890 Hormone replacement therapy (postmenopausal): Secondary | ICD-10-CM

## 2021-01-28 MED ORDER — ESTRADIOL-NORETHINDRONE ACET 1-0.5 MG PO TABS: 1.0000 | ORAL_TABLET | Freq: Every day | ORAL | 0 refills | Status: DC

## 2021-01-29 ENCOUNTER — Ambulatory Visit: Payer: Medicare HMO | Admitting: Internal Medicine

## 2021-02-10 ENCOUNTER — Encounter: Payer: Medicare HMO | Admitting: Internal Medicine

## 2021-02-18 DIAGNOSIS — H524 Presbyopia: Secondary | ICD-10-CM | POA: Diagnosis not present

## 2021-02-18 DIAGNOSIS — H16223 Keratoconjunctivitis sicca, not specified as Sjogren's, bilateral: Secondary | ICD-10-CM | POA: Diagnosis not present

## 2021-02-18 DIAGNOSIS — H35033 Hypertensive retinopathy, bilateral: Secondary | ICD-10-CM | POA: Diagnosis not present

## 2021-02-18 DIAGNOSIS — H10503 Unspecified blepharoconjunctivitis, bilateral: Secondary | ICD-10-CM | POA: Diagnosis not present

## 2021-02-18 DIAGNOSIS — H2513 Age-related nuclear cataract, bilateral: Secondary | ICD-10-CM | POA: Diagnosis not present

## 2021-03-18 ENCOUNTER — Other Ambulatory Visit: Payer: Self-pay

## 2021-03-19 ENCOUNTER — Encounter: Payer: Self-pay | Admitting: Internal Medicine

## 2021-03-19 ENCOUNTER — Ambulatory Visit (INDEPENDENT_AMBULATORY_CARE_PROVIDER_SITE_OTHER): Payer: Medicare HMO | Admitting: Internal Medicine

## 2021-03-19 VITALS — BP 120/80 | HR 70 | Temp 98.2°F | Ht 66.0 in | Wt 256.5 lb

## 2021-03-19 DIAGNOSIS — Z79890 Hormone replacement therapy (postmenopausal): Secondary | ICD-10-CM | POA: Diagnosis not present

## 2021-03-19 DIAGNOSIS — Z0000 Encounter for general adult medical examination without abnormal findings: Secondary | ICD-10-CM

## 2021-03-19 DIAGNOSIS — C3411 Malignant neoplasm of upper lobe, right bronchus or lung: Secondary | ICD-10-CM | POA: Diagnosis not present

## 2021-03-19 DIAGNOSIS — I10 Essential (primary) hypertension: Secondary | ICD-10-CM

## 2021-03-19 DIAGNOSIS — C659 Malignant neoplasm of unspecified renal pelvis: Secondary | ICD-10-CM | POA: Diagnosis not present

## 2021-03-19 DIAGNOSIS — I700 Atherosclerosis of aorta: Secondary | ICD-10-CM

## 2021-03-19 DIAGNOSIS — E890 Postprocedural hypothyroidism: Secondary | ICD-10-CM

## 2021-03-19 DIAGNOSIS — N1831 Chronic kidney disease, stage 3a: Secondary | ICD-10-CM

## 2021-03-19 DIAGNOSIS — E782 Mixed hyperlipidemia: Secondary | ICD-10-CM | POA: Diagnosis not present

## 2021-03-19 MED ORDER — ESTRADIOL-NORETHINDRONE ACET 1-0.5 MG PO TABS: 1.0000 | ORAL_TABLET | Freq: Every day | ORAL | 5 refills | Status: DC

## 2021-03-19 MED ORDER — ALBUTEROL SULFATE HFA 108 (90 BASE) MCG/ACT IN AERS: 1.0000 | INHALATION_SPRAY | Freq: Four times a day (QID) | RESPIRATORY_TRACT | 2 refills | Status: DC | PRN

## 2021-03-19 NOTE — Patient Instructions (Signed)
-  Nice seeing you today!!  -Lab work today; will notify you once results are available.  -Schedule follow up in 6 months or sooner as needed.   

## 2021-03-19 NOTE — Progress Notes (Signed)
Established Patient Office Visit     This visit occurred during the SARS-CoV-2 public health emergency.  Safety protocols were in place, including screening questions prior to the visit, additional usage of staff PPE, and extensive cleaning of exam room while observing appropriate contact time as indicated for disinfecting solutions.    CC/Reason for Visit: Annual preventive exam and subsequent Medicare wellness visit  HPI: Maria Marquez is a 75 y.o. female who is coming in today for the above mentioned reasons. Past Medical History is significant for: Hypertension, hyperlipidemia, hypothyroidism, morbid obesity, chronic kidney disease stage IIIa.  She has been diagnosed with a high-grade urothelial carcinoma followed by Dr. Alen Blew as well as an early stage primary lung cancer for which she declined lobectomy and is being treated with radiation.  She has been feeling well in general.  She has what she describes as "chemo brain".  She has been dealing with some Planter fasciitis.  She has routine eye and dental care.  No perceived hearing issues, she has understandably been more sedentary this past year.  She is due for a second COVID booster but otherwise immunizations are up-to-date.  She had a colonoscopy in 2017 and is a 5-year callback, she is thinking of doing it for a few weeks due to what she is currently undergoing with her malignancies, she had a mammogram in May.  She cannot recall when she last had a Pap smear.   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[facility]) urologist--- dr winter/  oncologist--- dr Alen Blew   dx 08/ 2021 ;  chemo 10/ 2021  to 12/ 2021   Chronic kidney disease, stage 3a ([place]) 12/31/2019   Dyspnea    12-26-2020  per pt no issue most of the time   GERD  (gastroesophageal reflux disease)    per pt occasionally takes tums   History of basal cell carcinoma (BCC) excision    per pt in 1970s on nose   History of chemotherapy 06-13-2020  to 12/ 2021   right renal pelvis carcinoma   History of colon polyps    History of radiation therapy 11/13/20-11/26/20   Right Lung- SBRT- Dr. Gery Pray    HLD (hyperlipidemia)    Hypertension    followed by pcp   Hypothyroidism, postsurgical 1979   followed by pcp   Leukocytosis    Nocturia    OA (osteoarthritis)    Personal history of chemotherapy 2022   pt has kidney & lung CA   Personal history of radiation therapy 2022   pt has kidney & lung CA   PONV (postoperative nausea and vomiting)    Pre-diabetes    Primary adenocarcinoma of upper lobe of right lung [facility]) oncologist--- dr shadad/  pulmonology-- dr byrum   dx 02/ 2022;  s/p  SBRT 11-13-2020 to 11-26-2020   Urgency of urination    Wears partial dentures    lower    Past Surgical History:  Procedure Laterality Date   COLONOSCOPY  last one 2017   CYSTOSCOPY WITH URETEROSCOPY Right 04/18/2020   Procedure: CYSTOSCOPY WITH URETEROSCOPY, BIOPSY;  Surgeon: Ceasar Mons, MD;  Location: [facility];  Service: Urology;  Laterality: Right;  ONLY NEEDS 45 MIN   CYSTOSCOPY WITH URETEROSCOPY AND STENT PLACEMENT N/A 12/31/2020  Procedure: CYSTOSCOPY WITH RIGHT URETEROSCOPY/ BILATERAL RETROGRADE PYELOGRAM/RIGHT URETERAL BIOPSY/ LASER ABLATION/RIGHT STENT PLACEMENT;  Surgeon: Ceasar Mons, MD;  Location: [facility];  Service: Urology;  Laterality: N/A;   EYE SURGERY Bilateral 1987   tear ducts   IR IMAGING GUIDED PORT INSERTION  06/04/2020   THYROID LOBECTOMY Right 1979   TOTAL HIP ARTHROPLASTY Right 01/28/2016   Procedure: RIGHT TOTAL HIP ARTHROPLASTY ANTERIOR APPROACH;  Surgeon: [name], MD;  Location: [facility];  Service: Orthopedics;  Laterality: Right;   UMBILICAL HERNIA REPAIR  1980s   VIDEO BRONCHOSCOPY WITH  ENDOBRONCHIAL NAVIGATION N/A 06/06/2020   Procedure: VIDEO BRONCHOSCOPY WITH ENDOBRONCHIAL NAVIGATION;  Surgeon: Collene Gobble, MD;  Location: [place];  Service: Thoracic;  Laterality: N/A;   VIDEO BRONCHOSCOPY WITH ENDOBRONCHIAL NAVIGATION N/A 10/10/2020   Procedure: VIDEO BRONCHOSCOPY WITH ENDOBRONCHIAL NAVIGATION;  Surgeon: [name], MD;  Location: [place];  Service: Thoracic;  Laterality: N/A;    Social History:  reports that she quit smoking about 11 months ago. Her smoking use included cigarettes. She has a 45.00 pack-year smoking history. She has quit using smokeless tobacco. She reports previous alcohol use. She reports that she does not use drugs.  Allergies: Allergies  Allergen Reactions   Iodine Shortness Of Breath   Keflex [Cephalexin] Shortness Of Breath, Rash and Tinitus   Shellfish Allergy Hives   Lasix [Furosemide] Itching   Rosuvastatin Other (See Comments)    Muscle Pain in Thighs   Latex Rash   Lisinopril Cough   Sulfa Antibiotics Nausea Only   Tramadol Itching    Family History:  Family History  Problem Relation Age of Onset   Arthritis Mother    Breast cancer Mother 42   Schizophrenia Mother    Diabetes Father    Heart disease Father    Kidney disease Father    Stroke Sister    Lung cancer Brother    Heart disease Brother    Heart disease Brother    Vision loss Brother        Glaucoma   Alcohol abuse Brother      Current Outpatient Medications:    acetaminophen (TYLENOL) 500 MG tablet, Take 500-1,500 mg by mouth every 8 (eight) hours as needed for moderate pain., Disp: , Rfl:    aspirin EC 81 MG tablet, Take 81 mg by mouth daily. Swallow whole., Disp: , Rfl:    calcium carbonate (TUMS - DOSED IN MG ELEMENTAL CALCIUM) 500 MG chewable tablet, Chew 500 mg by mouth daily as needed for indigestion or heartburn., Disp: , Rfl:    EPINEPHrine 0.3 mg/0.3 mL IJ SOAJ injection, Inject 0.3 mg into the muscle as needed for anaphylaxis., Disp: 1 each,  Rfl: 2   ezetimibe (ZETIA) 10 MG tablet, Take 1 tablet (10 mg total) by mouth daily., Disp: 90 tablet, Rfl: 0   HYDROcodone-acetaminophen (NORCO) 5-325 MG tablet, Take 1 tablet by mouth every 4 (four) hours as needed for moderate pain., Disp: 20 tablet, Rfl: 0   levothyroxine (SYNTHROID) 125 MCG tablet, Take 1 tablet (125 mcg total) by mouth at bedtime. (Patient taking differently: Take 125 mcg by mouth at bedtime.), Disp: 90 tablet, Rfl: 1   lidocaine-prilocaine (EMLA) cream, Apply 1 application topically as needed. (Patient taking differently: Apply 1 application topically as needed (port access).), Disp: 30 g, Rfl: 0   losartan-hydrochlorothiazide (HYZAAR) 50-12.5 MG tablet, TAKE 1 TABLET EVERY DAY (Patient taking differently: Take 1 tablet by mouth daily.), Disp: 90 tablet, Rfl: 3  XIIDRA 5 % SOLN, Place 1 drop into both eyes 2 (two) times daily as needed (dry eyes)., Disp: , Rfl:    albuterol (VENTOLIN HFA) 108 (90 Base) MCG/ACT inhaler, Inhale 1-2 puffs into the lungs every 6 (six) hours as needed for wheezing or shortness of breath., Disp: 18 g, Rfl: 2   estradiol-norethindrone (ACTIVELLA) 1-0.5 MG tablet, Take 1 tablet by mouth daily., Disp: 28 tablet, Rfl: 5  Review of Systems:  Constitutional: Denies fever, chills, diaphoresis, appetite change. HEENT: Denies photophobia, eye pain, redness, hearing loss, ear pain, congestion, sore throat, rhinorrhea, sneezing, mouth sores, trouble swallowing, neck pain, neck stiffness and tinnitus.   Respiratory: Denies SOB, DOE, cough, chest tightness,  and wheezing.   Cardiovascular: Denies chest pain, palpitations and leg swelling.  Gastrointestinal: Denies nausea, vomiting, abdominal pain, diarrhea, constipation, blood in stool and abdominal distention.  Genitourinary: Denies dysuria, urgency, frequency, hematuria, flank pain and difficulty urinating.  Endocrine: Denies: hot or cold intolerance, sweats, changes in hair or nails, polyuria,  polydipsia. Musculoskeletal: Denies myalgias, back pain, joint swelling, arthralgias and gait problem.  Skin: Denies pallor, rash and wound.  Neurological: Denies dizziness, seizures, syncope, weakness, light-headedness, numbness and headaches.  Hematological: Denies adenopathy. Easy bruising, personal or family bleeding history  Psychiatric/Behavioral: Denies suicidal ideation, mood changes, confusion, nervousness, sleep disturbance and agitation    Physical Exam: Vitals:   03/19/21 1436  BP: 120/80  Pulse: 70  Temp: 98.2 F (36.8 C)  TempSrc: Oral  SpO2: 96%  Weight: 256 lb 8 oz (116.3 kg)  Height: 5\' 6"  (1.676 m)    Body mass index is 41.4 kg/m.   Constitutional: NAD, calm, comfortable, obese Eyes: PERRL, lids and conjunctivae normal ENMT: Mucous membranes are moist. Posterior pharynx clear of any exudate or lesions. Normal dentition. Tympanic membrane is pearly white, no erythema or bulging. Neck: normal, supple, no masses, no thyromegaly Respiratory: clear to auscultation bilaterally, no wheezing, no crackles. Normal respiratory effort. No accessory muscle use.  Cardiovascular: Regular rate and rhythm, no murmurs / rubs / gallops. No extremity edema. 2+ pedal pulses. No carotid bruits.  Abdomen: no tenderness, no masses palpated. No hepatosplenomegaly. Bowel sounds positive.  Musculoskeletal: no clubbing / cyanosis. No joint deformity upper and lower extremities. Good ROM, no contractures. Normal muscle tone.  Skin: no rashes, lesions, ulcers. No induration Neurologic: CN 2-12 grossly intact. Sensation intact, DTR normal. Strength 5/5 in all 4.  Psychiatric: Normal judgment and insight. Alert and oriented x 3. Normal mood.    Subsequent Medicare wellness visit   1. Risk factors, based on past  M,S,F -cardiovascular disease risk factors include age, history of hyper tension, history of hyperlipidemia, multiple rounds of radiation therapy, obesity   2.  Physical  activities: She is very sedentary   3.  Depression/mood: Stable, not depressed   4.  Hearing: No perceived issues   5.  ADL's: Independent in all ADLs   6.  Fall risk: Low fall risk   7.  Home safety: No problems identified   8.  Height weight, and visual acuity: height and weight as above, vision:  Vision Screening   Right eye Left eye Both eyes  Without correction 20/20 20/20 20/20   With correction     Comments: Dr Bing Plume    9.  Counseling: Advise she update her second COVID booster   10. Lab orders based on risk factors: Laboratory update will be reviewed   11. Referral : None today   12. Care plan: Follow-up with  me in 6 months   13. Cognitive assessment: No cognitive impairment   14. Screening: Patient provided with a written and personalized 5-10 year screening schedule in the AVS. yes   15. Provider List Update: PCP, oncology Dr. Alen Blew  16. Advance Directives: Full code   17. Opioids: Patient is not on any opioid prescriptions and has no risk factors for a substance use disorder.   [place] Office Visit from 03/19/2021 in [facility] at [place]  PHQ-9 Total Score 4       Fall Risk  03/19/2021 12/31/2019 12/31/2019 10/03/2019 06/14/2019  Falls in the past year? 0 0 0 0 0  Number falls in past yr: 0 0 0 - 0  Injury with Fall? 0 0 0 - 0  Risk for fall due to : - - - - History of fall(s)  Follow up - Falls evaluation completed;Education provided;Falls prevention discussed - Falls evaluation completed Falls evaluation completed     Impression and Plan:  Encounter for preventive health examination -She has routine eye and dental care. -She will be due her second COVID booster in September but otherwise immunizations are up-to-date. -Screening labs today. -Healthy lifestyle discussed in detail. -She is not due for her repeat colonoscopy. -She had a normal mammogram in May. -She has elected to defer further cervical cancer screening. -DEXA scan  requested today.  Post-menopause on HRT (hormone replacement therapy)  - Plan: estradiol-norethindrone (ACTIVELLA) 1-0.5 MG tablet -She is currently taking hormone replacement therapy every other day.  Primary cancer of right upper lobe of lung ([place])  Cancer of renal pelvis, unspecified laterality ([place]) -Followed by both medical and radiation oncology.  Chronic kidney disease, stage 3a ([place]) -Recheck renal function today, creatinine was 1.480 in June 2022.  Mixed hyperlipidemia -prior lipid panel with a total cholesterol of 204, triglycerides 196 and LDL 127. -She is only on ezetimibe 10 mg daily due to history of statin intolerance.  Essential hypertension -Well-controlled.  Morbid obesity ([place]) -Discussed healthy lifestyle, including increased physical activity and better food choices to promote weight loss.  Postoperative hypothyroidism  -Check TSH, she is on levothyroxine 125 mcg daily.  Aortic atherosclerosis ([place]) -Noted on prior imaging study.    Patient Instructions  -Nice seeing you today!!  -Lab work today; will notify you once results are available.  -Schedule follow up in 6 months or sooner as needed.     [name], MD [facility]

## 2021-03-20 ENCOUNTER — Other Ambulatory Visit: Payer: Self-pay | Admitting: Internal Medicine

## 2021-03-20 ENCOUNTER — Encounter: Payer: Self-pay | Admitting: Oncology

## 2021-03-20 ENCOUNTER — Encounter: Payer: Self-pay | Admitting: Internal Medicine

## 2021-03-20 DIAGNOSIS — E559 Vitamin D deficiency, unspecified: Secondary | ICD-10-CM | POA: Insufficient documentation

## 2021-03-20 DIAGNOSIS — Z780 Asymptomatic menopausal state: Secondary | ICD-10-CM

## 2021-03-20 DIAGNOSIS — Z13820 Encounter for screening for osteoporosis: Secondary | ICD-10-CM

## 2021-03-20 DIAGNOSIS — Z124 Encounter for screening for malignant neoplasm of cervix: Secondary | ICD-10-CM

## 2021-03-20 DIAGNOSIS — E890 Postprocedural hypothyroidism: Secondary | ICD-10-CM

## 2021-03-20 LAB — COMPREHENSIVE METABOLIC PANEL
ALT: 13 U/L (ref 0–35)
AST: 12 U/L (ref 0–37)
Albumin: 4.1 g/dL (ref 3.5–5.2)
Alkaline Phosphatase: 72 U/L (ref 39–117)
BUN: 30 mg/dL — ABNORMAL HIGH (ref 6–23)
CO2: 28 mEq/L (ref 19–32)
Calcium: 9.3 mg/dL (ref 8.4–10.5)
Chloride: 101 mEq/L (ref 96–112)
Creatinine, Ser: 1.51 mg/dL — ABNORMAL HIGH (ref 0.40–1.20)
GFR: 34.17 mL/min — ABNORMAL LOW (ref >60.00)
Glucose, Bld: 94 mg/dL (ref 70–99)
Potassium: 4.4 mEq/L (ref 3.5–5.1)
Sodium: 138 mEq/L (ref 135–145)
Total Bilirubin: 0.6 mg/dL (ref 0.2–1.2)
Total Protein: 6.8 g/dL (ref 6.0–8.3)

## 2021-03-20 LAB — CBC WITH DIFFERENTIAL/PLATELET
Basophils Absolute: 0.1 10*3/uL (ref 0.0–0.1)
Basophils Relative: 0.9 % (ref 0.0–3.0)
Eosinophils Absolute: 0.2 10*3/uL (ref 0.0–0.7)
Eosinophils Relative: 2.3 % (ref 0.0–5.0)
HCT: 38.7 % (ref 36.0–46.0)
Hemoglobin: 12.8 g/dL (ref 12.0–15.0)
Lymphocytes Relative: 18.2 % (ref 12.0–46.0)
Lymphs Abs: 1.4 10*3/uL (ref 0.7–4.0)
MCHC: 33.1 g/dL (ref 30.0–36.0)
MCV: 91.6 fl (ref 78.0–100.0)
Monocytes Absolute: 0.6 10*3/uL (ref 0.1–1.0)
Monocytes Relative: 7.5 % (ref 3.0–12.0)
Neutro Abs: 5.3 10*3/uL (ref 1.4–7.7)
Neutrophils Relative %: 71.1 % (ref 43.0–77.0)
Platelets: 249 10*3/uL (ref 150.0–400.0)
RBC: 4.23 Mil/uL (ref 3.87–5.11)
RDW: 15.4 % (ref 11.5–15.5)
WBC: 7.5 10*3/uL (ref 4.0–10.5)

## 2021-03-20 LAB — LIPID PANEL
Cholesterol: 240 mg/dL — ABNORMAL HIGH (ref 0–200)
HDL: 47.5 mg/dL (ref >39.00)
NonHDL: 192.52
Total CHOL/HDL Ratio: 5
Triglycerides: 205 mg/dL — ABNORMAL HIGH (ref 0.0–149.0)
VLDL: 41 mg/dL — ABNORMAL HIGH (ref 0.0–40.0)

## 2021-03-20 LAB — TSH: TSH: 3.47 u[IU]/mL (ref 0.35–5.50)

## 2021-03-20 LAB — HEMOGLOBIN A1C: Hgb A1c MFr Bld: 6.2 % (ref 4.6–6.5)

## 2021-03-20 LAB — VITAMIN B12: Vitamin B-12: 236 pg/mL (ref 211–911)

## 2021-03-20 LAB — VITAMIN D 25 HYDROXY (VIT D DEFICIENCY, FRACTURES): VITD: 30.56 ng/mL (ref 30.00–100.00)

## 2021-03-20 LAB — LDL CHOLESTEROL, DIRECT: Direct LDL: 165 mg/dL

## 2021-03-20 MED ORDER — VITAMIN D (ERGOCALCIFEROL) 1.25 MG (50000 UNIT) PO CAPS: 50000.0000 [IU] | ORAL_CAPSULE | ORAL | 0 refills | Status: AC

## 2021-03-24 ENCOUNTER — Telehealth: Payer: Self-pay | Admitting: *Deleted

## 2021-03-24 NOTE — Telephone Encounter (Signed)
Called patient to inform of CT for 03-31-21- arrival time- 2:15 pm @ [facility], no restrictions to test, patient to receive results from Dr. Sondra Come on 04-09-21 @ 11:45 am, spoke with patient and she is aware of these appts.

## 2021-03-25 ENCOUNTER — Other Ambulatory Visit: Payer: Self-pay | Admitting: Internal Medicine

## 2021-03-25 DIAGNOSIS — E782 Mixed hyperlipidemia: Secondary | ICD-10-CM

## 2021-03-25 MED ORDER — ATORVASTATIN CALCIUM 20 MG PO TABS: 20.0000 mg | ORAL_TABLET | Freq: Every day | ORAL | 1 refills | Status: DC

## 2021-03-28 ENCOUNTER — Other Ambulatory Visit: Payer: Self-pay | Admitting: Family Medicine

## 2021-03-31 ENCOUNTER — Inpatient Hospital Stay: Payer: Medicare HMO | Attending: Oncology

## 2021-03-31 ENCOUNTER — Inpatient Hospital Stay: Payer: Medicare HMO

## 2021-03-31 ENCOUNTER — Other Ambulatory Visit: Payer: Self-pay

## 2021-03-31 ENCOUNTER — Ambulatory Visit (HOSPITAL_COMMUNITY)
Admission: RE | Admit: 2021-03-31 | Discharge: 2021-03-31 | Disposition: A | Discharge status: Home / Self Care | Payer: Medicare HMO | Source: Ambulatory Visit | Attending: Radiation Oncology | Admitting: Radiation Oncology

## 2021-03-31 DIAGNOSIS — Z452 Encounter for adjustment and management of vascular access device: Secondary | ICD-10-CM | POA: Diagnosis not present

## 2021-03-31 DIAGNOSIS — D630 Anemia in neoplastic disease: Secondary | ICD-10-CM | POA: Insufficient documentation

## 2021-03-31 DIAGNOSIS — C651 Malignant neoplasm of right renal pelvis: Secondary | ICD-10-CM | POA: Diagnosis not present

## 2021-03-31 DIAGNOSIS — C659 Malignant neoplasm of unspecified renal pelvis: Secondary | ICD-10-CM

## 2021-03-31 DIAGNOSIS — Z8553 Personal history of malignant neoplasm of renal pelvis: Secondary | ICD-10-CM | POA: Diagnosis not present

## 2021-03-31 DIAGNOSIS — C3411 Malignant neoplasm of upper lobe, right bronchus or lung: Secondary | ICD-10-CM | POA: Insufficient documentation

## 2021-03-31 DIAGNOSIS — Z95828 Presence of other vascular implants and grafts: Secondary | ICD-10-CM

## 2021-03-31 DIAGNOSIS — C3490 Malignant neoplasm of unspecified part of unspecified bronchus or lung: Secondary | ICD-10-CM | POA: Diagnosis not present

## 2021-03-31 LAB — CBC WITH DIFFERENTIAL (CANCER CENTER ONLY)
Abs Immature Granulocytes: 0.02 10*3/uL (ref 0.00–0.07)
Basophils Absolute: 0 10*3/uL (ref 0.0–0.1)
Basophils Relative: 0 %
Eosinophils Absolute: 0.2 10*3/uL (ref 0.0–0.5)
Eosinophils Relative: 3 %
HCT: 36.4 % (ref 36.0–46.0)
Hemoglobin: 12.1 g/dL (ref 12.0–15.0)
Immature Granulocytes: 0 %
Lymphocytes Relative: 17 %
Lymphs Abs: 1.3 10*3/uL (ref 0.7–4.0)
MCH: 30.6 pg (ref 26.0–34.0)
MCHC: 33.2 g/dL (ref 30.0–36.0)
MCV: 92.2 fL (ref 80.0–100.0)
Monocytes Absolute: 0.6 10*3/uL (ref 0.1–1.0)
Monocytes Relative: 8 %
Neutro Abs: 5.8 10*3/uL (ref 1.7–7.7)
Neutrophils Relative %: 72 %
Platelet Count: 223 10*3/uL (ref 150–400)
RBC: 3.95 MIL/uL (ref 3.87–5.11)
RDW: 13.9 % (ref 11.5–15.5)
WBC Count: 8 10*3/uL (ref 4.0–10.5)
nRBC: 0 % (ref 0.0–0.2)

## 2021-03-31 LAB — CMP (CANCER CENTER ONLY)
ALT: 13 U/L (ref 0–44)
AST: 11 U/L — ABNORMAL LOW (ref 15–41)
Albumin: 3.5 g/dL (ref 3.5–5.0)
Alkaline Phosphatase: 90 U/L (ref 38–126)
Anion gap: 9 (ref 5–15)
BUN: 31 mg/dL — ABNORMAL HIGH (ref 8–23)
CO2: 25 mmol/L (ref 22–32)
Calcium: 9.3 mg/dL (ref 8.9–10.3)
Chloride: 105 mmol/L (ref 98–111)
Creatinine: 1.49 mg/dL — ABNORMAL HIGH (ref 0.44–1.00)
GFR, Estimated: 37 mL/min — ABNORMAL LOW (ref >60)
Glucose, Bld: 139 mg/dL — ABNORMAL HIGH (ref 70–99)
Potassium: 3.7 mmol/L (ref 3.5–5.1)
Sodium: 139 mmol/L (ref 135–145)
Total Bilirubin: 0.5 mg/dL (ref 0.3–1.2)
Total Protein: 6.8 g/dL (ref 6.5–8.1)

## 2021-03-31 MED ORDER — SODIUM CHLORIDE 0.9% FLUSH
10.0000 mL | Freq: Once | INTRAVENOUS | Status: AC
  Administered 2021-03-31: 10 mL
  Filled 2021-03-31: qty 10

## 2021-03-31 MED ORDER — HEPARIN SOD (PORK) LOCK FLUSH 100 UNIT/ML IV SOLN
INTRAVENOUS | Status: AC
  Administered 2021-03-31: 500 [IU] via INTRAVENOUS
  Filled 2021-03-31: qty 5

## 2021-03-31 MED ORDER — HEPARIN SOD (PORK) LOCK FLUSH 100 UNIT/ML IV SOLN: 500.0000 [IU] | Freq: Once | INTRAVENOUS | Status: AC

## 2021-04-02 ENCOUNTER — Inpatient Hospital Stay (HOSPITAL_BASED_OUTPATIENT_CLINIC_OR_DEPARTMENT_OTHER): Payer: Medicare HMO | Admitting: Oncology

## 2021-04-02 ENCOUNTER — Ambulatory Visit: Payer: Self-pay | Admitting: Radiation Oncology

## 2021-04-02 ENCOUNTER — Other Ambulatory Visit: Payer: Self-pay

## 2021-04-02 VITALS — BP 152/62 | HR 75 | Temp 97.9°F | Resp 18 | Ht 66.0 in | Wt 261.4 lb

## 2021-04-02 DIAGNOSIS — Z8553 Personal history of malignant neoplasm of renal pelvis: Secondary | ICD-10-CM | POA: Diagnosis not present

## 2021-04-02 DIAGNOSIS — C659 Malignant neoplasm of unspecified renal pelvis: Secondary | ICD-10-CM | POA: Diagnosis not present

## 2021-04-02 DIAGNOSIS — C3411 Malignant neoplasm of upper lobe, right bronchus or lung: Secondary | ICD-10-CM | POA: Diagnosis not present

## 2021-04-02 DIAGNOSIS — Z95828 Presence of other vascular implants and grafts: Secondary | ICD-10-CM | POA: Diagnosis not present

## 2021-04-02 DIAGNOSIS — C3490 Malignant neoplasm of unspecified part of unspecified bronchus or lung: Secondary | ICD-10-CM | POA: Diagnosis not present

## 2021-04-02 DIAGNOSIS — D630 Anemia in neoplastic disease: Secondary | ICD-10-CM | POA: Diagnosis not present

## 2021-04-02 DIAGNOSIS — Z452 Encounter for adjustment and management of vascular access device: Secondary | ICD-10-CM | POA: Diagnosis not present

## 2021-04-02 NOTE — Progress Notes (Signed)
Hematology and Oncology Follow Up   Maria Marquez [id number] 1945-11-04 75 y.o. 04/02/2021 2:47 PM Maria Marquez, Maria Marquez, MDHernandez Lucilla Marquez*      Principle Diagnosis: 75 year old woman with:  1. 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      2.  Lung cancer diagnosed in February 2022.  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        Prior Therapy:    She is status post cystoscopy and urethroscopy and a biopsy of her renal pelvic mass completed by Dr. Lovena Marquez in April 18, 2020.  This showed high-grade urothelial carcinoma.   She is status post bronchoscopy and a biopsy of her right upper lung mass which was not diagnostic.   Gemcitabine and cisplatin chemotherapy started on June 13, 2020.  She completed 3 cycles and cycle 4 was given without cisplatin completed in December 2021.  She is status post bronchoscopy and a biopsy completed by Dr. Roxan Marquez on October 10, 2020 which confirmed the presence of primary lung neoplasm.  SBRT to right lung cancer between March 24 to November 26, 2020.  She received total of 50 Gray and 10 fractions.  She is status post repeat cystoscopy in and biopsy as well as complete laser tumor ablation completed on Dec 31, 2020.  Final pathology showed low-grade urothelial carcinoma.  This without muscle invasive disease.   Current therapy: Active surveillance.  Interim History: Maria Marquez is here for repeat evaluation.  Since the last visit, she reports no major changes in her health.  She denies any recent hospitalizations or illnesses.  She denies any shortness of breath or difficulty breathing.  Her performance status quality of life continues to improve at this time.  She denies any hematuria or dysuria.  .    Medications: Unchanged on review. Current Outpatient Medications  Medication Sig Dispense Refill   atorvastatin (LIPITOR) 20 MG tablet Take 1 tablet  (20 mg total) by mouth daily. 90 tablet 1   acetaminophen (TYLENOL) 500 MG tablet Take 500-1,500 mg by mouth every 8 (eight) hours as needed for moderate pain.     albuterol (VENTOLIN HFA) 108 (90 Base) MCG/ACT inhaler Inhale 1-2 puffs into the lungs every 6 (six) hours as needed for wheezing or shortness of breath. 18 g 2   aspirin EC 81 MG tablet Take 81 mg by mouth daily. Swallow whole.     calcium carbonate (TUMS - DOSED IN MG ELEMENTAL CALCIUM) 500 MG chewable tablet Chew 500 mg by mouth daily as needed for indigestion or heartburn.     EPINEPHrine 0.3 mg/0.3 mL IJ SOAJ injection Inject 0.3 mg into the muscle as needed for anaphylaxis. 1 each 2   estradiol-norethindrone (ACTIVELLA) 1-0.5 MG tablet Take 1 tablet by mouth daily. 28 tablet 5   ezetimibe (ZETIA) 10 MG tablet TAKE 1 TABLET EVERY DAY 90 tablet 1   HYDROcodone-acetaminophen (NORCO) 5-325 MG tablet Take 1 tablet by mouth every 4 (four) hours as needed for moderate pain. 20 tablet 0   levothyroxine (SYNTHROID) 125 MCG tablet TAKE 1 TABLET AT BEDTIME 90 tablet 1   lidocaine-prilocaine (EMLA) cream Apply 1 application topically as needed. (Patient taking differently: Apply 1 application topically as needed (port access).) 30 g 0   losartan-hydrochlorothiazide (HYZAAR) 50-12.5 MG tablet TAKE 1 TABLET EVERY DAY 90 tablet 3   Vitamin D, Ergocalciferol, (DRISDOL) 1.25 MG (50000 UNIT) CAPS capsule Take 1 capsule (50,000 Units total) by  mouth every 7 (seven) days for 12 doses. 12 capsule 0   XIIDRA 5 % SOLN Place 1 drop into both eyes 2 (two) times daily as needed (dry eyes).     No current facility-administered medications for this visit.     Allergies:  Allergies  Allergen Reactions   Iodine Shortness Of Breath   Keflex [Cephalexin] Shortness Of Breath, Rash and Tinitus   Shellfish Allergy Hives   Lasix [Furosemide] Itching   Rosuvastatin Other (See Comments)    Muscle Pain in Thighs   Latex Rash   Lisinopril Cough   Sulfa  Antibiotics Nausea Only   Tramadol Itching   Physical exam:   Blood pressure (!) 152/62, pulse 75, temperature 97.9 F (36.6 C), temperature source Oral, resp. rate 18, height 5\' 6"  (1.676 m), weight 261 lb 6.4 oz (118.6 kg), SpO2 98 %.   ECOG 1   General appearance: Comfortable appearing without any discomfort Head: Normocephalic without any trauma Oropharynx: Mucous membranes are moist and pink without any thrush or ulcers. Eyes: Pupils are equal and round reactive to light. Lymph nodes: No cervical, supraclavicular, inguinal or axillary lymphadenopathy.   Heart:regular rate and rhythm.  S1 and S2 without leg edema. Lung: Clear without any rhonchi or wheezes.  No dullness to percussion. Abdomin: Soft, nontender, nondistended with good bowel sounds.  No hepatosplenomegaly. Musculoskeletal: No joint deformity or effusion.  Full range of motion noted. Neurological: No deficits noted on motor, sensory and deep tendon reflex exam. Skin: No petechial rash or dryness.  Appeared moist.       Lab Results: Lab Results  Component Value Date   WBC 8.0 03/31/2021   HGB 12.1 03/31/2021   HCT 36.4 03/31/2021   MCV 92.2 03/31/2021   PLT 223 03/31/2021     Chemistry      Component Value Date/Time   NA 139 03/31/2021 1254   NA 140 12/19/2019 1132   K 3.7 03/31/2021 1254   CL 105 03/31/2021 1254   CO2 25 03/31/2021 1254   BUN 31 (H) 03/31/2021 1254   BUN 25 12/19/2019 1132   CREATININE 1.49 (H) 03/31/2021 1254   GLU 92 02/04/2016 0000      Component Value Date/Time   CALCIUM 9.3 03/31/2021 1254   ALKPHOS 90 03/31/2021 1254   AST 11 (L) 03/31/2021 1254   ALT 13 03/31/2021 1254   BILITOT 0.5 03/31/2021 1254        IMPRESSION: 1. The treated right upper lobe suprahilar lung cancer is not significantly changed from the most recent PET-CT of 6 months prior. Continued follow-up recommended. 2. A solid 5 mm nodule posteriorly in the right upper lobe is stable from at least  05/14/2020 and likely benign. No new or enlarging nodules. 3. Resolution of previously demonstrated right-sided hydronephrosis. No focal renal or ureteral abnormality identified on noncontrast imaging. No evidence metastatic disease in the abdomen or pelvis. 4. Incidental findings including cholelithiasis, small periumbilical hernias and Aortic Atherosclerosis (ICD10-I70.0).    Impression and Plan:   75 year old woman with   1.     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     CT scan obtained on March 31, 2021 was personally reviewed and showed no evidence of fall right-sided hydronephrosis or clear-cut recurrence at this time.  She is scheduled to have a cystoscopy and ureteroscopy and possible laser ablation of any focal tumor detected.  Systemic therapy in the form of checkpoint inhibitors will be used if  she developed relapsed disease beyond surgical intervention.  I plan to repeat imaging studies in 3 months and continue close surveillance given her high risk of disease relapse.      2.   Right lung cancer diagnosed in February 2022.  She was found to have stage IA adenocarcinoma.  She is status post radiation therapy with excellent response without any evidence of relapsed disease based on imaging studies.   3.  Renal function surveillance: Creatinine clearance remains close to baseline although abnormal.   4.  Port-A-Cath management: This will continue to be flushed periodically every 2 months.   5.  Anemia: Related to malignancy and chemotherapy.  Resolved at this time.   6.  Follow-up: She will return in 3 months for repeat follow-up and repeat imaging studies.  30  minutes were dedicated to this visit.  Time was spent on imaging studies, disease status update and management choices for the future.  Zola Button, MD 04/02/2021 2:47 PM

## 2021-04-08 NOTE — Progress Notes (Signed)
Radiation Oncology         [phone] ________________________________  Name: Maria Marquez MRN: [id number]  Date: 04/09/2021  DOB: 03-Apr-1946  Follow-Up Visit Note  CC: [name], Rayford Halsted, MD  [name], *    ICD-10-CM   1. 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 CT of the chest abdomen and pelvis taken on 03/31/21 demonstrated the unchanged appearance of the treated right upper lobe suprahilar lung cancer when compared to most recent PET-CT.  However it was noted that the area shows less density consistent with treatment effect.  Imaging additionally demonstrated the solid 5 mm posterior right upper lobe pulmonary nodule to appear stable since visualized on imaging from 05/14/20. No new/enlarging nodules or evidence of metastatic disease in the abdomen or pelvis was seen.   Of note: the patient underwent cytoscopy with complete laser tumor ablation and biopsy of right ureteral mass on 12/31/20. 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 She followed up with Dr. Alen Blew in regards to this on 01/07/21. During which time, Dr. Alen Blew did not  recommend any additional systemic therapy given her recent cytoscopy and noninvasive tumor histology.     She is scheduled for additional cystoscopy later this month for close follow-up.  She denies any hematuria.  She denies any breathing problems she denies any  significant cough or hemoptysis.  She does occasionally notice some wheezing along the right side which she uses inhaler approximately once or twice a week.                    Allergies:  is allergic to iodine, keflex [cephalexin], shellfish allergy, lasix [furosemide], rosuvastatin, latex, lisinopril, sulfa antibiotics, and tramadol.  Meds: Current Outpatient Medications  Medication Sig Dispense Refill   acetaminophen (TYLENOL) 500 MG tablet Take 500-1,500 mg by mouth every 8 (eight) hours as needed for moderate pain.     albuterol (VENTOLIN HFA) 108 (90 Base) MCG/ACT inhaler Inhale 1-2 puffs into the lungs every 6 (six) hours as needed for wheezing or shortness of breath. 18 g 2   aspirin EC 81 MG tablet Take 81 mg by mouth daily. Swallow whole.     atorvastatin (LIPITOR) 20 MG tablet Take 1 tablet (20 mg total) by mouth daily. 90 tablet 1   calcium carbonate (TUMS - DOSED IN MG ELEMENTAL CALCIUM) 500 MG chewable tablet Chew 500 mg by mouth daily as needed for indigestion or heartburn.     EPINEPHrine 0.3 mg/0.3 mL IJ SOAJ injection Inject 0.3 mg into the muscle as needed for anaphylaxis. 1 each 2   estradiol-norethindrone (ACTIVELLA) 1-0.5 MG tablet Take 1 tablet by mouth daily. 28 tablet 5   ezetimibe (ZETIA) 10 MG tablet TAKE 1 TABLET EVERY DAY 90 tablet 1   levothyroxine (SYNTHROID) 125 MCG tablet TAKE 1 TABLET AT BEDTIME 90 tablet 1   lidocaine-prilocaine (EMLA) cream Apply 1 application topically as needed. (Patient  taking differently: Apply 1 application topically as needed (port access).) 30 g 0   losartan-hydrochlorothiazide (HYZAAR) 50-12.5 MG tablet TAKE 1 TABLET EVERY DAY 90 tablet 3   vitamin B-12 (CYANOCOBALAMIN) 1000 MCG tablet Take 1,000 mcg by mouth daily.     Vitamin D, Ergocalciferol, (DRISDOL) 1.25 MG (50000 UNIT) CAPS capsule Take 1 capsule (50,000 Units total) by mouth every 7 (seven) days for 12 doses. 12 capsule 0   XIIDRA 5 % SOLN Place 1 drop into both eyes 2 (two)  times daily as needed (dry eyes).     HYDROcodone-acetaminophen (NORCO) 5-325 MG tablet Take 1 tablet by mouth every 4 (four) hours as needed for moderate pain. (Patient not taking: Reported on 04/09/2021) 20 tablet 0   No current facility-administered medications for this encounter.    Physical Findings: The patient is in no acute distress. Patient is alert and oriented.  height is 5' 6"  (1.676 m) and weight is 260 lb 2 oz (118 kg). Her temperature is 97.5 F (36.4 C) (abnormal). Her blood pressure is 137/85 and her pulse is 72. Her respiration is 18 and oxygen saturation is 100%. .  No significant changes. Lungs are clear to auscultation bilaterally. Heart has regular rate and rhythm. No palpable cervical, supraclavicular, or axillary adenopathy. Abdomen soft, non-tender, normal bowel sounds.    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 Non-small cell lung cancer diagnosed in February 2022 with SB RT completed 4 months ago. Surveillance. EXAM: CT CHEST, ABDOMEN AND PELVIS WITHOUT CONTRAST TECHNIQUE: Multidetector CT imaging of the chest, abdomen and pelvis was performed following the standard protocol without IV contrast. COMPARISON:  PET-CT 09/19/2020, chest CT 06/04/2020 and 05/14/2020, abdominopelvic CT 03/19/2020. FINDINGS: CT CHEST FINDINGS Cardiovascular: Atherosclerosis of the aorta, great vessels and coronary arteries. Right IJ Port-A-Cath extends to the superior cavoatrial junction. The heart size is normal. There is no pericardial effusion. Mediastinum/Nodes: There are no enlarged mediastinal, hilar or axillary lymph nodes. Previous right thyroidectomy. Stable small hiatal hernia. Lungs/Pleura: No pleural  effusion or pneumothorax. There is stable mild biapical scarring. The treated right upper lobe suprahilar lesion measures 2.5 x 1.3 cm on image 47/4 and is similar in size to head CT of 6 months ago. This appears slightly less solid than on previous chest CT. 5 mm solid nodule posteriorly in the right upper on image 63/4 is stable. No new or enlarging pulmonary nodules. Musculoskeletal/Chest wall: No chest wall mass or suspicious osseous findings. CT ABDOMEN AND PELVIS FINDINGS Hepatobiliary: Stable cyst peripherally in the left hepatic lobe and small calcified gallstones. No gallbladder wall thickening or biliary dilatation. Pancreas: Unremarkable. No pancreatic ductal dilatation or surrounding inflammatory changes. Spleen: Normal in size without focal abnormality. Adrenals/Urinary Tract: Both adrenal glands appear normal. Possible tiny nonobstructing calculus in the upper pole of the left kidney, best seen on image 162/6. Otherwise, both kidneys appear unremarkable as imaged in the noncontrast state. There is no hydronephrosis or perinephric soft tissue stranding. The bladder appears unremarkable. Stomach/Bowel: Enteric contrast was administered and has passed into the distal transverse colon. The stomach appears unremarkable for its degree of distension. No evidence of bowel wall thickening, distention or surrounding inflammatory change. The  appendix appears normal. Moderate stool in the distal colon. Vascular/Lymphatic: There are no enlarged abdominal or pelvic lymph nodes. Aortic and branch vessel atherosclerosis without evidence of aneurysm. Reproductive: The uterus and ovaries appear unchanged with a probable uterine fundal fibroid. No adnexal mass. Other: Small periumbilical hernias containing only fat. No ascites or peritoneal nodularity. Musculoskeletal: No acute or significant osseous findings. Lower lumbar spondylosis and right total hip arthroplasty are noted. IMPRESSION: 1. The treated right upper lobe  suprahilar lung cancer is not significantly changed from the most recent PET-CT of 6 months prior. Continued follow-up recommended. 2. A solid 5 mm nodule posteriorly in the right upper lobe is stable from at least 05/14/2020 and likely benign. No new or enlarging nodules. 3. Resolution of previously demonstrated right-sided hydronephrosis. No focal renal or ureteral abnormality identified on noncontrast imaging. No evidence metastatic disease in the abdomen or pelvis. 4. Incidental findings including cholelithiasis, small periumbilical hernias and Aortic Atherosclerosis (ICD10-I70.0). Electronically Signed   By: Richardean Sale M.D.   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 Non-small cell lung cancer diagnosed in February 2022 with SB RT completed 4 months ago. Surveillance. EXAM: CT CHEST, ABDOMEN AND PELVIS WITHOUT CONTRAST TECHNIQUE: Multidetector CT imaging of the chest, abdomen and pelvis was performed following the standard protocol without IV contrast. COMPARISON:  PET-CT 09/19/2020, chest CT 06/04/2020 and 05/14/2020, abdominopelvic CT 03/19/2020. FINDINGS: CT CHEST FINDINGS Cardiovascular: Atherosclerosis of the aorta, great vessels and coronary arteries. Right IJ Port-A-Cath extends to the superior cavoatrial junction. The heart size is normal. There is no pericardial effusion. Mediastinum/Nodes: There are no enlarged mediastinal, hilar or axillary lymph nodes. Previous right thyroidectomy. Stable small hiatal hernia. Lungs/Pleura: No pleural effusion or pneumothorax. There is stable mild biapical scarring. The treated right upper lobe suprahilar lesion measures 2.5 x 1.3 cm on image 47/4 and is similar in size to head CT of 6 months ago. This appears slightly less solid than on previous chest CT. 5 mm solid nodule posteriorly in the right upper on image 63/4 is stable. No new or enlarging  pulmonary nodules. Musculoskeletal/Chest wall: No chest wall mass or suspicious osseous findings. CT ABDOMEN AND PELVIS FINDINGS Hepatobiliary: Stable cyst peripherally in the left hepatic lobe and small calcified gallstones. No gallbladder wall thickening or biliary dilatation. Pancreas: Unremarkable. No pancreatic ductal dilatation or surrounding inflammatory changes. Spleen: Normal in size without focal abnormality. Adrenals/Urinary Tract: Both adrenal glands appear normal. Possible tiny nonobstructing calculus in the upper pole of the left kidney, best seen on image 162/6. Otherwise, both kidneys appear unremarkable as imaged in the noncontrast state. There is no hydronephrosis or perinephric soft tissue stranding. The bladder appears unremarkable. Stomach/Bowel: Enteric contrast was administered and has passed into the distal transverse colon. The stomach appears unremarkable for its degree of distension. No evidence of bowel wall thickening, distention or surrounding inflammatory change. The appendix appears normal. Moderate stool in the distal colon. Vascular/Lymphatic: There are no enlarged abdominal or pelvic lymph nodes. Aortic and branch vessel atherosclerosis without evidence of aneurysm. Reproductive: The uterus and ovaries appear unchanged with a probable uterine fundal fibroid. No adnexal mass. Other: Small periumbilical hernias containing only fat. No ascites or peritoneal nodularity. Musculoskeletal: No acute or significant osseous findings. Lower lumbar spondylosis and right total hip arthroplasty are noted. IMPRESSION: 1. The treated right upper lobe suprahilar lung cancer is not significantly changed from  the most recent PET-CT of 6 months prior. Continued follow-up recommended. 2. A solid 5 mm nodule posteriorly in the right upper lobe is stable from at least 05/14/2020 and likely benign. No new or enlarging nodules. 3. Resolution of previously demonstrated right-sided hydronephrosis. No focal  renal or ureteral abnormality identified on noncontrast imaging. No evidence metastatic disease in the abdomen or pelvis. 4. Incidental findings including cholelithiasis, small periumbilical hernias and Aortic Atherosclerosis (ICD10-I70.0). Electronically Signed   By: Richardean Sale M.D.   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    Patient will continue close follow-up with urology concerning her urothelial carcinoma.  Patient reported today that her daughter has recently been diagnosed with advanced ovarian cancer.  She underwent surgery at [facility] and will be enrolled in a clinical trial at [facility] concerning this diagnosis.  In addition patient informed me that her daughter has been genetically tested and has a BRCA1 or 2 mutation  In addition the patient informed me that her niece has been recently diagnosed with locally advanced breast cancer.  She is unsure whether she has undergone genetic testing.  Given these findings I feel it is reasonable to consult with genetic counseling here at the cancer center and the patient is in agreement.  Plan: Referral to genetic testing concerning above issues.  Patient will be scheduled for CT scan of the chest in 6 months for follow-up of her early stage lung cancer.  She will follow-up for review and exam with me soon afterward.  She will continue close follow-up with medical oncology and urology in addition.   20 minutes of total time was spent for this patient encounter, including preparation, face-to-face counseling with the patient and coordination of care, physical exam, and documentation of the encounter. ____________________________________  Blair Promise, PhD, MD   This document serves as a record of services personally performed by Gery Pray, MD. It was  created on his behalf by Roney Mans, a trained medical scribe. The creation of this record is based on the scribe's personal observations and the provider's statements to them. This document has been checked and approved by the attending provider.

## 2021-04-09 ENCOUNTER — Ambulatory Visit
Admission: RE | Admit: 2021-04-09 | Discharge: 2021-04-09 | Disposition: A | Discharge status: Home / Self Care | Payer: Medicare HMO | Source: Ambulatory Visit | Attending: Radiation Oncology | Admitting: Radiation Oncology

## 2021-04-09 ENCOUNTER — Encounter: Payer: Self-pay | Admitting: Radiation Oncology

## 2021-04-09 ENCOUNTER — Other Ambulatory Visit: Payer: Self-pay

## 2021-04-09 VITALS — BP 137/85 | HR 72 | Temp 97.5°F | Resp 18 | Ht 66.0 in | Wt 260.1 lb

## 2021-04-09 DIAGNOSIS — K449 Diaphragmatic hernia without obstruction or gangrene: Secondary | ICD-10-CM | POA: Insufficient documentation

## 2021-04-09 DIAGNOSIS — K8020 Calculus of gallbladder without cholecystitis without obstruction: Secondary | ICD-10-CM | POA: Diagnosis not present

## 2021-04-09 DIAGNOSIS — Z7982 Long term (current) use of aspirin: Secondary | ICD-10-CM | POA: Insufficient documentation

## 2021-04-09 DIAGNOSIS — Z79899 Other long term (current) drug therapy: Secondary | ICD-10-CM | POA: Insufficient documentation

## 2021-04-09 DIAGNOSIS — Z923 Personal history of irradiation: Secondary | ICD-10-CM | POA: Diagnosis not present

## 2021-04-09 DIAGNOSIS — Z08 Encounter for follow-up examination after completed treatment for malignant neoplasm: Secondary | ICD-10-CM | POA: Diagnosis not present

## 2021-04-09 DIAGNOSIS — I700 Atherosclerosis of aorta: Secondary | ICD-10-CM | POA: Diagnosis not present

## 2021-04-09 DIAGNOSIS — M47816 Spondylosis without myelopathy or radiculopathy, lumbar region: Secondary | ICD-10-CM | POA: Insufficient documentation

## 2021-04-09 DIAGNOSIS — C3411 Malignant neoplasm of upper lobe, right bronchus or lung: Secondary | ICD-10-CM | POA: Insufficient documentation

## 2021-04-09 NOTE — Progress Notes (Signed)
Maria Marquez is here today for follow up post radiation to the lung.  Lung Side: right  Does the patient complain of any of the following: Pain:7/10 to left foot plantar fasciitis  Shortness of breath w/wo exertion: wheezing in upper chest and throat that clears with inhaler, shortness of breath with activity Cough: denies Hemoptysis: denies Pain with swallowing: denies Swallowing/choking concerns: denies Appetite: good Energy Level: fair, is increasing lately Post radiation skin Changes: denies    Additional comments if applicable: none   Vitals:   04/09/21 1151  BP: 137/85  Pulse: 72  Resp: 18  Temp: (!) 97.5 F (36.4 C)  SpO2: 100%  Weight: 260 lb 2 oz (118 kg)  Height: 5\' 6"  (1.676 m)

## 2021-04-13 ENCOUNTER — Encounter: Payer: Self-pay | Admitting: Gastroenterology

## 2021-04-13 ENCOUNTER — Telehealth: Payer: Self-pay | Admitting: Oncology

## 2021-04-13 DIAGNOSIS — R3121 Asymptomatic microscopic hematuria: Secondary | ICD-10-CM | POA: Diagnosis not present

## 2021-04-13 DIAGNOSIS — C651 Malignant neoplasm of right renal pelvis: Secondary | ICD-10-CM | POA: Diagnosis not present

## 2021-04-13 DIAGNOSIS — R310 Gross hematuria: Secondary | ICD-10-CM | POA: Diagnosis not present

## 2021-04-13 NOTE — Telephone Encounter (Signed)
Scheduled appts per 8/19 sch msg. Pt aware.

## 2021-04-17 ENCOUNTER — Other Ambulatory Visit: Payer: Self-pay

## 2021-04-17 ENCOUNTER — Encounter (HOSPITAL_BASED_OUTPATIENT_CLINIC_OR_DEPARTMENT_OTHER): Payer: Self-pay | Admitting: Urology

## 2021-04-17 NOTE — Progress Notes (Signed)
Spoke w/ via phone for pre-op interview---Pt Lab needs dos----  EKG             Lab results------ pt has current labs dated 03-31-2021, CBCdiff and CMP, results in epic COVID test -----patient states asymptomatic no test needed Arrive at ------- 0530 on 04-22-2021 NPO after MN NO Solid Food.  Clear liquids from MN until--- 0430 Med rec completed Medications to take morning of surgery ----- NONE Diabetic medication ----- n/a Patient instructed no nail polish to be worn day of surgery Patient instructed to bring photo id and insurance card day of surgery Patient aware to have Driver (ride ) / caregiver   for 24 hours after surgery --daughter-n-law, Maria Marquez Patient Special Instructions ----- asked to bring rescue inhaler dos Pre-Op special Istructions ----- n/a Patient verbalized understanding of instructions that were given at this phone interview. Patient denies shortness of breath, chest pain, fever, cough at this phone interview.    Anesthesia :  HTN;  dx RUL lung cancer 02/ 2022 completed SBRT 04/ 2022;  dx right renal pelvis cancer 08/ 2021j completed chemo 12/ 2021;  CKD3;  Pre-diabetes;  Pt stated uses rescue prn, last used 2 wks ago,  only has sob with flight of stairs but recovers quickly.  PCP: Dr A. Jerilee Hoh Oncologist:  Dr Alen Blew (lov 04-02-2021 epic) Chest x-ray : CT 03-31-2021 EKG : 04-15-2020 epic Echo : no Stress test: no Activity level:  no sob w/ walking and household chores Sleep Study/ CPAP : no Blood Thinner/ Instructions /Last Dose: no ASA / Instructions/ Last Dose :  ASA 81mg 

## 2021-04-22 ENCOUNTER — Ambulatory Visit (HOSPITAL_BASED_OUTPATIENT_CLINIC_OR_DEPARTMENT_OTHER): Payer: Medicare HMO | Admitting: Certified Registered Nurse Anesthetist

## 2021-04-22 ENCOUNTER — Encounter (HOSPITAL_BASED_OUTPATIENT_CLINIC_OR_DEPARTMENT_OTHER)
Admission: RE | Disposition: A | Discharge status: Home / Self Care | Payer: Self-pay | Source: Home / Self Care | Attending: Urology

## 2021-04-22 ENCOUNTER — Encounter (HOSPITAL_BASED_OUTPATIENT_CLINIC_OR_DEPARTMENT_OTHER): Payer: Self-pay | Admitting: Urology

## 2021-04-22 ENCOUNTER — Ambulatory Visit (HOSPITAL_BASED_OUTPATIENT_CLINIC_OR_DEPARTMENT_OTHER)
Admission: RE | Admit: 2021-04-22 | Discharge: 2021-04-22 | Disposition: A | Discharge status: Home / Self Care | Payer: Medicare HMO | Attending: Urology | Admitting: Urology

## 2021-04-22 DIAGNOSIS — Z08 Encounter for follow-up examination after completed treatment for malignant neoplasm: Secondary | ICD-10-CM | POA: Insufficient documentation

## 2021-04-22 DIAGNOSIS — C651 Malignant neoplasm of right renal pelvis: Secondary | ICD-10-CM | POA: Diagnosis not present

## 2021-04-22 DIAGNOSIS — E039 Hypothyroidism, unspecified: Secondary | ICD-10-CM | POA: Diagnosis not present

## 2021-04-22 DIAGNOSIS — Z888 Allergy status to other drugs, medicaments and biological substances status: Secondary | ICD-10-CM | POA: Insufficient documentation

## 2021-04-22 DIAGNOSIS — Z882 Allergy status to sulfonamides status: Secondary | ICD-10-CM | POA: Diagnosis not present

## 2021-04-22 DIAGNOSIS — Z9221 Personal history of antineoplastic chemotherapy: Secondary | ICD-10-CM | POA: Insufficient documentation

## 2021-04-22 DIAGNOSIS — Z87891 Personal history of nicotine dependence: Secondary | ICD-10-CM | POA: Diagnosis not present

## 2021-04-22 DIAGNOSIS — Z923 Personal history of irradiation: Secondary | ICD-10-CM | POA: Insufficient documentation

## 2021-04-22 DIAGNOSIS — Z881 Allergy status to other antibiotic agents status: Secondary | ICD-10-CM | POA: Insufficient documentation

## 2021-04-22 DIAGNOSIS — Z8551 Personal history of malignant neoplasm of bladder: Secondary | ICD-10-CM | POA: Diagnosis not present

## 2021-04-22 DIAGNOSIS — C3411 Malignant neoplasm of upper lobe, right bronchus or lung: Secondary | ICD-10-CM | POA: Diagnosis not present

## 2021-04-22 DIAGNOSIS — Z8559 Personal history of malignant neoplasm of other urinary tract organ: Secondary | ICD-10-CM | POA: Diagnosis not present

## 2021-04-22 DIAGNOSIS — E782 Mixed hyperlipidemia: Secondary | ICD-10-CM | POA: Diagnosis not present

## 2021-04-22 DIAGNOSIS — E559 Vitamin D deficiency, unspecified: Secondary | ICD-10-CM | POA: Diagnosis not present

## 2021-04-22 DIAGNOSIS — Z8553 Personal history of malignant neoplasm of renal pelvis: Secondary | ICD-10-CM | POA: Diagnosis not present

## 2021-04-22 HISTORY — PX: CYSTOSCOPY/RETROGRADE/URETEROSCOPY: SHX5316

## 2021-04-22 LAB — GLUCOSE, CAPILLARY: Glucose-Capillary: 129 mg/dL — ABNORMAL HIGH (ref 70–99)

## 2021-04-22 SURGERY — CYSTOSCOPY/RETROGRADE/URETEROSCOPY
Anesthesia: General | Site: Ureter | Laterality: Right

## 2021-04-22 MED ORDER — SODIUM CHLORIDE 0.9 % IR SOLN
Status: DC | PRN
  Administered 2021-04-22: 3000 mL via INTRAVESICAL

## 2021-04-22 MED ORDER — EPHEDRINE SULFATE-NACL 50-0.9 MG/10ML-% IV SOSY
PREFILLED_SYRINGE | INTRAVENOUS | Status: DC | PRN
  Administered 2021-04-22: 10 mg via INTRAVENOUS
  Administered 2021-04-22: 5 mg via INTRAVENOUS
  Administered 2021-04-22: 10 mg via INTRAVENOUS

## 2021-04-22 MED ORDER — ACETAMINOPHEN 10 MG/ML IV SOLN: 1000.0000 mg | Freq: Once | INTRAVENOUS | Status: DC | PRN

## 2021-04-22 MED ORDER — DEXAMETHASONE SODIUM PHOSPHATE 10 MG/ML IJ SOLN
INTRAMUSCULAR | Status: AC
  Filled 2021-04-22: qty 1

## 2021-04-22 MED ORDER — FENTANYL CITRATE (PF) 100 MCG/2ML IJ SOLN
INTRAMUSCULAR | Status: AC
  Filled 2021-04-22: qty 2

## 2021-04-22 MED ORDER — IOHEXOL 300 MG/ML  SOLN
INTRAMUSCULAR | Status: DC | PRN
  Administered 2021-04-22: 10 mL

## 2021-04-22 MED ORDER — CIPROFLOXACIN IN D5W 400 MG/200ML IV SOLN
INTRAVENOUS | Status: AC
  Filled 2021-04-22: qty 200

## 2021-04-22 MED ORDER — ONDANSETRON HCL 4 MG/2ML IJ SOLN: 4.0000 mg | Freq: Once | INTRAMUSCULAR | Status: DC | PRN

## 2021-04-22 MED ORDER — CIPROFLOXACIN IN D5W 400 MG/200ML IV SOLN
400.0000 mg | INTRAVENOUS | Status: AC
  Administered 2021-04-22: 400 mg via INTRAVENOUS

## 2021-04-22 MED ORDER — OXYCODONE HCL 5 MG/5ML PO SOLN: 5.0000 mg | Freq: Once | ORAL | Status: DC | PRN

## 2021-04-22 MED ORDER — ONDANSETRON HCL 4 MG/2ML IJ SOLN
INTRAMUSCULAR | Status: AC
  Filled 2021-04-22: qty 2

## 2021-04-22 MED ORDER — FENTANYL CITRATE (PF) 100 MCG/2ML IJ SOLN: 25.0000 ug | INTRAMUSCULAR | Status: DC | PRN

## 2021-04-22 MED ORDER — PROPOFOL 500 MG/50ML IV EMUL
INTRAVENOUS | Status: AC
  Filled 2021-04-22: qty 50

## 2021-04-22 MED ORDER — ONDANSETRON HCL 4 MG/2ML IJ SOLN
INTRAMUSCULAR | Status: DC | PRN
Start: 2021-04-22 — End: 2021-04-22
  Administered 2021-04-22: 4 mg via INTRAVENOUS

## 2021-04-22 MED ORDER — SODIUM CHLORIDE 0.9 % IV SOLN: INTRAVENOUS | Status: DC

## 2021-04-22 MED ORDER — FENTANYL CITRATE (PF) 100 MCG/2ML IJ SOLN
INTRAMUSCULAR | Status: DC | PRN
  Administered 2021-04-22 (×4): 25 ug via INTRAVENOUS

## 2021-04-22 MED ORDER — OXYCODONE HCL 5 MG PO TABS: 5.0000 mg | ORAL_TABLET | Freq: Once | ORAL | Status: DC | PRN

## 2021-04-22 MED ORDER — LIDOCAINE 2% (20 MG/ML) 5 ML SYRINGE
INTRAMUSCULAR | Status: DC | PRN
  Administered 2021-04-22: 80 mg via INTRAVENOUS

## 2021-04-22 MED ORDER — LIDOCAINE HCL (PF) 2 % IJ SOLN
INTRAMUSCULAR | Status: AC
  Filled 2021-04-22: qty 5

## 2021-04-22 MED ORDER — PROPOFOL 10 MG/ML IV BOLUS
INTRAVENOUS | Status: DC | PRN
  Administered 2021-04-22: 100 mg via INTRAVENOUS
  Administered 2021-04-22: 200 mg via INTRAVENOUS

## 2021-04-22 MED ORDER — 0.9 % SODIUM CHLORIDE (POUR BTL) OPTIME
TOPICAL | Status: DC | PRN
  Administered 2021-04-22: 500 mL

## 2021-04-22 MED ORDER — DEXAMETHASONE SODIUM PHOSPHATE 10 MG/ML IJ SOLN
INTRAMUSCULAR | Status: DC | PRN
Start: 2021-04-22 — End: 2021-04-22
  Administered 2021-04-22: 10 mg via INTRAVENOUS

## 2021-04-22 SURGICAL SUPPLY — 26 items
APL SKNCLS STERI-STRIP NONHPOA (GAUZE/BANDAGES/DRESSINGS)
BAG DRAIN URO-CYSTO SKYTR STRL (DRAIN) ×2 IMPLANT
BAG DRN UROCATH (DRAIN) ×1
BASKET STONE 1.7 NGAGE (UROLOGICAL SUPPLIES) IMPLANT
BASKET ZERO TIP NITINOL 2.4FR (BASKET) ×2 IMPLANT
BENZOIN TINCTURE PRP APPL 2/3 (GAUZE/BANDAGES/DRESSINGS) IMPLANT
BSKT STON RTRVL ZERO TP 2.4FR (BASKET) ×1
CATH URET 5FR 28IN OPEN ENDED (CATHETERS) IMPLANT
CLOTH BEACON ORANGE TIMEOUT ST (SAFETY) ×2 IMPLANT
FIBER LASER FLEXIVA 365 (UROLOGICAL SUPPLIES) IMPLANT
GLOVE SURG ENC MOIS LTX SZ7.5 (GLOVE) ×2 IMPLANT
GOWN STRL REUS W/TWL XL LVL3 (GOWN DISPOSABLE) ×2 IMPLANT
GUIDEWIRE STR DUAL SENSOR (WIRE) IMPLANT
GUIDEWIRE ZIPWRE .038 STRAIGHT (WIRE) ×2 IMPLANT
IV NS IRRIG 3000ML ARTHROMATIC (IV SOLUTION) ×4 IMPLANT
KIT TURNOVER CYSTO (KITS) ×2 IMPLANT
MANIFOLD NEPTUNE II (INSTRUMENTS) ×2 IMPLANT
NS IRRIG 500ML POUR BTL (IV SOLUTION) ×2 IMPLANT
PACK CYSTO (CUSTOM PROCEDURE TRAY) ×2 IMPLANT
STENT URET 6FRX24 CONTOUR (STENTS) IMPLANT
STRIP CLOSURE SKIN 1/2X4 (GAUZE/BANDAGES/DRESSINGS) IMPLANT
SYR 10ML LL (SYRINGE) ×3 IMPLANT
TRACTIP FLEXIVA PULS ID 200XHI (Laser) IMPLANT
TRACTIP FLEXIVA PULSE ID 200 (Laser)
TUBE CONNECTING 12X1/4 (SUCTIONS) IMPLANT
TUBING UROLOGY SET (TUBING) ×2 IMPLANT

## 2021-04-22 NOTE — Anesthesia Postprocedure Evaluation (Signed)
Anesthesia Post Note  Patient: Maria Marquez  Procedure(s) Performed: CYSTOSCOPY/RETROGRADE/URETEROSCOPY/ STENT PLACEMENT (Right: Ureter)     Patient location during evaluation: PACU Anesthesia Type: General Level of consciousness: awake and alert Pain management: pain level controlled Vital Signs Assessment: post-procedure vital signs reviewed and stable Respiratory status: spontaneous breathing, nonlabored ventilation, respiratory function stable and patient connected to nasal cannula oxygen Cardiovascular status: blood pressure returned to baseline and stable Postop Assessment: no apparent nausea or vomiting Anesthetic complications: no   No notable events documented.  Last Vitals:  Vitals:   04/22/21 0830 04/22/21 0845  BP: (!) 150/69 (!) 151/63  Pulse: 76 75  Resp: 16 16  Temp:    SpO2: 96% 100%    Last Pain:  Vitals:   04/22/21 0845  TempSrc:   PainSc: 0-No pain                 Randen Kauth S

## 2021-04-22 NOTE — H&P (Signed)
Urology Preoperative H&P   Chief Complaint:  Right renal pelvis UCC  History of Present Illness: Maria Marquez is a 75 y.o. female with a history of a 2.7 cm solid and enhancing lesion in the proximal aspects of the right ureter on CT during a work-up for gross hematuria. She was also noted to have a 13 mm left renal lesion, likely representing a proteinaceous/hyperdense cyst. She is s/p diagnostic right ureteroscopy on 04/18/20 and was found to have multiple large papillary tumors throughout the right renal pelvis/UPJ. 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 She is s/p gem/cis chemotherapy and radiation to the thorax.   She underwent a surveillance right ureteroscopy on 12/31/2020 and was found to have a small 5 mm papillary bladder tumor involving the proximal aspects of the right ureter.  This tumor was ablated with the holmium laser.  No other urothelial lesions were identified.  Recent imaging suggest that she is NED from a lung cancer standpoint and showed no obvious upper urinary tract lesions.  She denies interval episodes of flank pain, dysuria or gross hematuria.   Past Medical History:  Diagnosis Date   Anemia associated with chemotherapy    followed by dr Alen Blew   Bilateral lower extremity edema    per pt occasionally ankles swell   Carcinoma of renal pelvis, right [facility])    urologist-- dr Ashleymarie Granderson/  oncologist-- dr Alen Blew;   dx 08/ 2021 ;  chemo 10/ 2021  to 12/ 2021   Chronic kidney disease, stage 3a ([place]) 12/31/2019   Dyspnea    12-26-2020  per pt no issue most of the time   GERD (gastroesophageal reflux disease)    per pt occasionally takes tums   History of basal cell carcinoma (BCC) excision    per pt in 1970s on nose   History of chemotherapy    06-13-2020  to 12/ 2021  for right renal pelvis carcinoma   History of colon polyps    History of radiation therapy     11-13-2020 to 11-26-2020---   Right Lung- SBRT- Dr. Gery Pray   HLD (hyperlipidemia)    Hypertension    followed by pcp   Hypothyroidism, postsurgical 1979   followed by pcp   Leukocytosis    Nocturia    OA (osteoarthritis)    PONV (postoperative nausea and vomiting)    Pre-diabetes    Primary adenocarcinoma of upper lobe of right lung [facility])    oncologist--- shadad/  pulmonology-- dr byrum;  dx 02/ 2022;  s/p  SBRT 11-13-2020 to 11-26-2020   Urgency of urination    Wears partial dentures    lower    Past Surgical History:  Procedure Laterality Date   COLONOSCOPY  last one 2017   CYSTOSCOPY WITH URETEROSCOPY Right 04/18/2020   Procedure: CYSTOSCOPY WITH URETEROSCOPY, BIOPSY;  Surgeon: Ceasar Mons, MD;  Location: [facility];  Service: Urology;  Laterality: Right;  ONLY NEEDS 45 MIN   CYSTOSCOPY WITH URETEROSCOPY AND STENT PLACEMENT N/A 12/31/2020   Procedure: CYSTOSCOPY WITH RIGHT URETEROSCOPY/ BILATERAL RETROGRADE PYELOGRAM/RIGHT URETERAL BIOPSY/ LASER ABLATION/RIGHT STENT PLACEMENT;  Surgeon: Ceasar Mons, MD;  Location: [facility];  Service: Urology;  Laterality: N/A;   EYE SURGERY Bilateral 1987   tear ducts   IR IMAGING GUIDED PORT INSERTION  06/04/2020   THYROID LOBECTOMY Right 1979   TOTAL HIP ARTHROPLASTY Right 01/28/2016   Procedure:  RIGHT TOTAL HIP ARTHROPLASTY ANTERIOR APPROACH;  Surgeon: [name], MD;  Location: [facility];  Service: Orthopedics;  Laterality: Right;   UMBILICAL HERNIA REPAIR  1980s   VIDEO BRONCHOSCOPY WITH ENDOBRONCHIAL NAVIGATION N/A 06/06/2020   Procedure: VIDEO BRONCHOSCOPY WITH ENDOBRONCHIAL NAVIGATION;  Surgeon: Collene Gobble, MD;  Location: [place];  Service: Thoracic;  Laterality: N/A;   VIDEO BRONCHOSCOPY WITH ENDOBRONCHIAL NAVIGATION N/A 10/10/2020   Procedure: VIDEO BRONCHOSCOPY WITH ENDOBRONCHIAL NAVIGATION;  Surgeon: [name], MD;  Location: [place];  Service: Thoracic;  Laterality: N/A;     Allergies:  Allergies  Allergen Reactions   Iodine Shortness Of Breath   Keflex [Cephalexin] Shortness Of Breath, Rash and Tinitus   Shellfish Allergy Hives   Lasix [Furosemide] Itching   Rosuvastatin Other (See Comments)    Muscle Pain in Thighs   Latex Rash   Lisinopril Cough   Sulfa Antibiotics Nausea Only   Tramadol Itching    Family History  Problem Relation Age of Onset   Arthritis Mother    Breast cancer Mother 44   Schizophrenia Mother    Diabetes Father    Heart disease Father    Kidney disease Father    Stroke Sister    Lung cancer Brother    Heart disease Brother    Heart disease Brother    Vision loss Brother        Glaucoma   Alcohol abuse Brother     Social History:  reports that she quit smoking about a year ago. Her smoking use included cigarettes. She has a 45.00 pack-year smoking history. She has never used smokeless tobacco. She reports that she does not currently use alcohol. She reports that she does not use drugs.  ROS: A complete review of systems was performed.  All systems are negative except for pertinent findings as noted.  Physical Exam:  Vital signs in last 24 hours: Temp:  [98.4 F (36.9 C)] 98.4 F (36.9 C) (08/31 0603) Pulse Rate:  [76] 76 (08/31 0603) Resp:  [18] 18 (08/31 0603) BP: (159)/(75) 159/75 (08/31 0603) SpO2:  [97 %] 97 % (08/31 0603) Weight:  [116.1 kg] 116.1 kg (08/31 0603) Constitutional:  Alert and oriented, No acute distress Cardiovascular: Regular rate and rhythm, No JVD Respiratory: Normal respiratory effort, Lungs clear bilaterally GI: Abdomen is soft, nontender, nondistended, no abdominal masses GU: No CVA tenderness Lymphatic: No lymphadenopathy Neurologic: Grossly intact, no focal deficits Psychiatric: Normal mood and affect  Laboratory Data:  No results for input(s): WBC, HGB, HCT, PLT in the last 72 hours.  No results for input(s): NA, K, CL, GLUCOSE, BUN, CALCIUM, CREATININE in the last 72  hours.  Invalid input(s): CO3   Results for orders placed or performed during the hospital encounter of 04/22/21 (from the past 24 hour(s))  Glucose, capillary     Status: Abnormal   Collection Time: 04/22/21  6:17 AM  Result Value Ref Range   Glucose-Capillary 129 (H) 70 - 99 mg/dL   No results found for this or any previous visit (from the past 240 hour(s)).  Renal Function: No results for input(s): CREATININE in the last 168 hours. CrCl cannot be calculated (Patient's most recent lab result is older than the maximum 21 days allowed.).  Radiologic Imaging: CLINICAL DATA:  Urologic cancer of the right renal pelvis diagnosed in August 2020. Non-small cell lung cancer diagnosed in February 2022 with SB RT completed 4 months ago. Surveillance.   EXAM: CT CHEST, ABDOMEN AND PELVIS WITHOUT CONTRAST  TECHNIQUE: Multidetector CT imaging of the chest, abdomen and pelvis was performed following the standard protocol without IV contrast.   COMPARISON:  PET-CT 09/19/2020, chest CT 06/04/2020 and 05/14/2020, abdominopelvic CT 03/19/2020.   FINDINGS: CT CHEST FINDINGS   Cardiovascular: Atherosclerosis of the aorta, great vessels and coronary arteries. Right IJ Port-A-Cath extends to the superior cavoatrial junction. The heart size is normal. There is no pericardial effusion.   Mediastinum/Nodes: There are no enlarged mediastinal, hilar or axillary lymph nodes. Previous right thyroidectomy. Stable small hiatal hernia.   Lungs/Pleura: No pleural effusion or pneumothorax. There is stable mild biapical scarring. The treated right upper lobe suprahilar lesion measures 2.5 x 1.3 cm on image 47/4 and is similar in size to head CT of 6 months ago. This appears slightly less solid than on previous chest CT. 5 mm solid nodule posteriorly in the right upper on image 63/4 is stable. No new or enlarging pulmonary nodules.   Musculoskeletal/Chest wall: No chest wall mass or suspicious  osseous findings.   CT ABDOMEN AND PELVIS FINDINGS   Hepatobiliary: Stable cyst peripherally in the left hepatic lobe and small calcified gallstones. No gallbladder wall thickening or biliary dilatation.   Pancreas: Unremarkable. No pancreatic ductal dilatation or surrounding inflammatory changes.   Spleen: Normal in size without focal abnormality.   Adrenals/Urinary Tract: Both adrenal glands appear normal. Possible tiny nonobstructing calculus in the upper pole of the left kidney, best seen on image 162/6. Otherwise, both kidneys appear unremarkable as imaged in the noncontrast state. There is no hydronephrosis or perinephric soft tissue stranding. The bladder appears unremarkable.   Stomach/Bowel: Enteric contrast was administered and has passed into the distal transverse colon. The stomach appears unremarkable for its degree of distension. No evidence of bowel wall thickening, distention or surrounding inflammatory change. The appendix appears normal. Moderate stool in the distal colon.   Vascular/Lymphatic: There are no enlarged abdominal or pelvic lymph nodes. Aortic and branch vessel atherosclerosis without evidence of aneurysm.   Reproductive: The uterus and ovaries appear unchanged with a probable uterine fundal fibroid. No adnexal mass.   Other: Small periumbilical hernias containing only fat. No ascites or peritoneal nodularity.   Musculoskeletal: No acute or significant osseous findings. Lower lumbar spondylosis and right total hip arthroplasty are noted.   IMPRESSION: 1. The treated right upper lobe suprahilar lung cancer is not significantly changed from the most recent PET-CT of 6 months prior. Continued follow-up recommended. 2. A solid 5 mm nodule posteriorly in the right upper lobe is stable from at least 05/14/2020 and likely benign. No new or enlarging nodules. 3. Resolution of previously demonstrated right-sided hydronephrosis. No focal renal or  ureteral abnormality identified on noncontrast imaging. No evidence metastatic disease in the abdomen or pelvis. 4. Incidental findings including cholelithiasis, small periumbilical hernias and Aortic Atherosclerosis (ICD10-I70.0).     Electronically Signed   By: Richardean Sale M.D.   On: 04/01/2021 12:47  I independently reviewed the above imaging studies.  Assessment and Plan Maria Marquez is a 75 y.o. 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 Risks include, but are not limited to, bleeding, urinary tract infection, ureteral stricture formation, renal loss, MI, CVA, DVT, PE and the inherent risk of general anesthesia. She voices understanding and wishes to proceed.  Ellison Hughs, MD 04/22/2021, [facility] Pager: [phone]

## 2021-04-22 NOTE — Op Note (Signed)
Preoperative Diagnosis: History of Right Hg UTUC  Postoperative Diagnosis:  Same  Procedure(s) Performed:   - Cystourethroscopy - Right ureteroscopy - Right ureteral stent placement - Intraoperative fluoroscopy with interpretation <1hr.   Teaching Surgeon:  Ellison Hughs, MD  Resident Surgeon:  Reola Mosher, MD  Assistant(s):  None  Anesthesia:  General  Fluids:  See anesthesia record  Estimated blood loss:  0cc  Specimens:  Right renal pelvic washing  Drains:  Right 6Fr x 24cm JJ ureteral stent with dangler  Complications:  None  Indications: 75 yo F patient with a history of right UTUC s/p URS ablation x2 here for surveillance ureteroscopy. Risks & benefits of the procedure discussed with the patient, who wishes to proceed.  Findings:   - Normal cystourethroscopy without masses, lesions, or stones - Right ureteroscopy notable for a focal narrowing in the proximal ureter of ~ 6F, which did accommodate the flexible ureteroscope - No evidence of cancer recurrence in the proximal ureter, renal pelvis, or calyces. - Successful right ureteral stent placement on a string (Notably the stent was removed during breakdown of the case and was elected not to be replaced)  Radiologic Interpretation of Retrograde Pyelogram: Right retrograde pyelogram demonstrated a focal narrowing in the proximal ureter, but otherwise no contrast extravasation, filling defects or evidence of hydroureteronephrosis.  Description:  The patient was correctly identified in the preop holding area where written informed consent as well potential risk and complication reviewed. The patient agreed. They were brought back to the operative suite where a preinduction timeout was performed. Once correct information was verified, general anesthesia was induced. They were then gently placed into dorsal lithotomy position with SCDs in place for VTE prophylaxis. They were prepped and draped in the usual sterile  fashion and given appropriate preoperative antibiotics. A second timeout was then performed.   We inserted a 61F rigid cystoscope per urethra with copious lubrication and normal saline irrigation running. This demonstrated findings as described above.    We cannulated the right ureteral orifice with the combination of a sensor wire and 6Fr open ended catheter and the sensor wire was advanced into the expected location of the renal pelvis without difficulty. The 6Fr open ended catheter was then advanced over the sensor wire into the distal ureter and the sensor wire was removed. A retrograde pyelogram was performed with findings as noted above. We then replaced the sensor wire into the right kidney and the 6Fr open ended catheter was removed. We then removed the cystoscope leaving our sensor wire in place.  We then passed a flexible ureteroscope alongside our wire and ureteroscopy was performed demonstrating the above findings. We performed a renal pelvic washing during ureteroscopy and this was sent off for cytology.   At this point we elected to leave a ureteral stent and withdrew our instruments leaving a sensor wire in place.  We then advanced a 6Fr x 24 cm JJ ureteral stent with dangler with the assistance of a stent pusher under direct fluoroscopic & visual guidance without difficultly.  Sensor wire removal demonstrated satisfactory stent curl proximally in the renal pelvis and distally in the bladder. The bladder was emptied and all instrumentation was removed. The stent string was shortened to an appropriate length. The patient was woken up from anesthesia and taken to the recovery unit for routine postoperative care.   Post Op Plan:   1. Discharge from PACU after x1 void 2. Notably the stent was removed during breakdown of the case and was  elected not to be replaced.    Attestation:  Dr. Lovena Neighbours was present and scrubbed for the entirety of the procedure.    Reola Mosher, MD [facility]  Resident, [facility]

## 2021-04-22 NOTE — Transfer of Care (Signed)
Immediate Anesthesia Transfer of Care Note  Patient: [name]  Procedure(s) Performed: CYSTOSCOPY/RETROGRADE/URETEROSCOPY/ STENT PLACEMENT (Right: Ureter)  Patient Location: PACU  Anesthesia Type:General  Level of Consciousness: awake, alert  and oriented  Airway & Oxygen Therapy: Patient Spontanous Breathing and Patient connected to face mask oxygen  Post-op Assessment: Report given to RN and Post -op Vital signs reviewed and stable  Post vital signs: Reviewed and stable  Last Vitals:  Vitals Value Taken Time  BP 147/66 04/22/21 0826  Temp    Pulse 76 04/22/21 0829  Resp 15 04/22/21 0829  SpO2 98 % 04/22/21 0829  Vitals shown include unvalidated device data.  Last Pain:  Vitals:   04/22/21 0603  TempSrc: Oral  PainSc: 7       Patients Stated Pain Goal: 7 (69/62/95 2841)  Complications: No notable events documented.

## 2021-04-22 NOTE — Anesthesia Procedure Notes (Signed)
Procedure Name: LMA Insertion Date/Time: 04/22/2021 7:36 AM Performed by: Genelle Bal, CRNA Pre-anesthesia Checklist: Patient identified, Emergency Drugs available, Suction available and Patient being monitored Patient Re-evaluated:Patient Re-evaluated prior to induction Oxygen Delivery Method: Circle system utilized Preoxygenation: Pre-oxygenation with 100% oxygen Induction Type: IV induction Ventilation: Mask ventilation without difficulty LMA: LMA inserted LMA Size: 4.0 Number of attempts: 1 Airway Equipment and Method: Bite block Placement Confirmation: positive ETCO2 Tube secured with: Tape Dental Injury: Teeth and Oropharynx as per pre-operative assessment

## 2021-04-22 NOTE — Anesthesia Preprocedure Evaluation (Signed)
Anesthesia Evaluation  Patient identified by MRN, date of birth, ID band Patient awake    Reviewed: Allergy & Precautions, NPO status , Patient's Chart, lab work & pertinent test results  History of Anesthesia Complications (+) PONV  Airway Mallampati: II  TM Distance: <3 FB Neck ROM: Full    Dental no notable dental hx.    Pulmonary neg pulmonary ROS, former smoker,    Pulmonary exam normal breath sounds clear to auscultation       Cardiovascular hypertension, Normal cardiovascular exam Rhythm:Regular Rate:Normal     Neuro/Psych negative neurological ROS  negative psych ROS   GI/Hepatic Neg liver ROS, GERD  ,  Endo/Other  Hypothyroidism Morbid obesity  Renal/GU negative Renal ROS  negative genitourinary   Musculoskeletal negative musculoskeletal ROS (+)   Abdominal (+) + obese,   Peds negative pediatric ROS (+)  Hematology negative hematology ROS (+)   Anesthesia Other Findings   Reproductive/Obstetrics negative OB ROS                             Anesthesia Physical Anesthesia Plan  ASA: 3  Anesthesia Plan: General   Post-op Pain Management:    Induction: Intravenous  PONV Risk Score and Plan: 4 or greater and Ondansetron, Dexamethasone and Treatment may vary due to age or medical condition  Airway Management Planned: LMA  Additional Equipment:   Intra-op Plan:   Post-operative Plan: Extubation in OR  Informed Consent: I have reviewed the patients History and Physical, chart, labs and discussed the procedure including the risks, benefits and alternatives for the proposed anesthesia with the patient or authorized representative who has indicated his/her understanding and acceptance.     Dental advisory given  Plan Discussed with: CRNA and Surgeon  Anesthesia Plan Comments:         Anesthesia Quick Evaluation

## 2021-04-23 ENCOUNTER — Other Ambulatory Visit: Payer: Self-pay | Admitting: Genetic Counselor

## 2021-04-23 ENCOUNTER — Telehealth: Payer: Self-pay | Admitting: Oncology

## 2021-04-23 ENCOUNTER — Telehealth: Payer: Self-pay | Admitting: Gastroenterology

## 2021-04-23 ENCOUNTER — Encounter (HOSPITAL_BASED_OUTPATIENT_CLINIC_OR_DEPARTMENT_OTHER): Payer: Self-pay | Admitting: Urology

## 2021-04-23 DIAGNOSIS — C659 Malignant neoplasm of unspecified renal pelvis: Secondary | ICD-10-CM

## 2021-04-23 DIAGNOSIS — C3490 Malignant neoplasm of unspecified part of unspecified bronchus or lung: Secondary | ICD-10-CM

## 2021-04-23 LAB — CYTOLOGY - NON PAP

## 2021-04-23 NOTE — Telephone Encounter (Signed)
Patient reports that she was diagnosed with 2 new cancers last year. She has lung and kidney, both primary sites.  She underwent chemo and radiation and is doing well.  She is concerned in light of these recent events waiting until 2024.  Does this change your recommendations on the procedure recall?

## 2021-04-23 NOTE — Telephone Encounter (Signed)
Sch per 8/11 los, pt aware

## 2021-04-23 NOTE — Telephone Encounter (Signed)
Inbound call from patient requesting a call from a nurse please.  Has questions about the recent letter we sent in regards to new recall date for colonoscopy.

## 2021-04-28 DIAGNOSIS — Z124 Encounter for screening for malignant neoplasm of cervix: Secondary | ICD-10-CM | POA: Diagnosis not present

## 2021-04-28 DIAGNOSIS — Z6841 Body Mass Index (BMI) 40.0 and over, adult: Secondary | ICD-10-CM | POA: Diagnosis not present

## 2021-04-28 NOTE — Telephone Encounter (Signed)
Briefly reviewed her chart. There is no clear increased risk of colon polyps or colon cancer with her history of urothelial carcinoma of the right renal pelvis and RUL lung adenocarcinoma. Her last colonoscopy did not show precancerous polyps. The guidelines would suggest a colonoscopy in 7 to 10 years. I had recommended a 7 year interval for 2024.  If she has new colorectal symptoms or a first degree relative with precancerous colon polyps or colon cancer it would be reasonable to proceed with colonoscopy at a 5 year interval. If she has other reasons or symptoms please outline. If no reason for a colonoscopy sooner than 7-10 years exists advise that she accept with our colonoscopy timing recommendation, proceed with a Cologuard test or proceed with an iFOB.

## 2021-04-29 NOTE — Telephone Encounter (Signed)
Patient is notified of the recommendations She is comfortable waiting until 12/2022. She thanked me for the call.

## 2021-04-30 ENCOUNTER — Inpatient Hospital Stay: Payer: Medicare HMO | Attending: Oncology | Admitting: Genetic Counselor

## 2021-04-30 ENCOUNTER — Inpatient Hospital Stay: Payer: Medicare HMO

## 2021-04-30 ENCOUNTER — Other Ambulatory Visit: Payer: Self-pay

## 2021-04-30 DIAGNOSIS — Z87891 Personal history of nicotine dependence: Secondary | ICD-10-CM | POA: Insufficient documentation

## 2021-04-30 DIAGNOSIS — C659 Malignant neoplasm of unspecified renal pelvis: Secondary | ICD-10-CM

## 2021-04-30 DIAGNOSIS — C3490 Malignant neoplasm of unspecified part of unspecified bronchus or lung: Secondary | ICD-10-CM

## 2021-04-30 DIAGNOSIS — Z808 Family history of malignant neoplasm of other organs or systems: Secondary | ICD-10-CM | POA: Insufficient documentation

## 2021-04-30 DIAGNOSIS — C3411 Malignant neoplasm of upper lobe, right bronchus or lung: Secondary | ICD-10-CM | POA: Diagnosis not present

## 2021-04-30 DIAGNOSIS — I10 Essential (primary) hypertension: Secondary | ICD-10-CM | POA: Diagnosis not present

## 2021-04-30 DIAGNOSIS — E039 Hypothyroidism, unspecified: Secondary | ICD-10-CM | POA: Diagnosis not present

## 2021-04-30 DIAGNOSIS — Z806 Family history of leukemia: Secondary | ICD-10-CM | POA: Insufficient documentation

## 2021-04-30 DIAGNOSIS — Z800 Family history of malignant neoplasm of digestive organs: Secondary | ICD-10-CM | POA: Diagnosis not present

## 2021-04-30 DIAGNOSIS — Z803 Family history of malignant neoplasm of breast: Secondary | ICD-10-CM | POA: Diagnosis not present

## 2021-04-30 DIAGNOSIS — Z8553 Personal history of malignant neoplasm of renal pelvis: Secondary | ICD-10-CM | POA: Diagnosis not present

## 2021-04-30 DIAGNOSIS — Z79899 Other long term (current) drug therapy: Secondary | ICD-10-CM | POA: Diagnosis not present

## 2021-04-30 DIAGNOSIS — E785 Hyperlipidemia, unspecified: Secondary | ICD-10-CM | POA: Insufficient documentation

## 2021-04-30 DIAGNOSIS — Z801 Family history of malignant neoplasm of trachea, bronchus and lung: Secondary | ICD-10-CM | POA: Diagnosis not present

## 2021-04-30 DIAGNOSIS — Z8041 Family history of malignant neoplasm of ovary: Secondary | ICD-10-CM

## 2021-04-30 DIAGNOSIS — Z95828 Presence of other vascular implants and grafts: Secondary | ICD-10-CM

## 2021-04-30 LAB — CBC WITH DIFFERENTIAL (CANCER CENTER ONLY)
Abs Immature Granulocytes: 0.05 10*3/uL (ref 0.00–0.07)
Basophils Absolute: 0 10*3/uL (ref 0.0–0.1)
Basophils Relative: 0 %
Eosinophils Absolute: 0.3 10*3/uL (ref 0.0–0.5)
Eosinophils Relative: 3 %
HCT: 37 % (ref 36.0–46.0)
Hemoglobin: 12.1 g/dL (ref 12.0–15.0)
Immature Granulocytes: 1 %
Lymphocytes Relative: 18 %
Lymphs Abs: 1.6 10*3/uL (ref 0.7–4.0)
MCH: 30.5 pg (ref 26.0–34.0)
MCHC: 32.7 g/dL (ref 30.0–36.0)
MCV: 93.2 fL (ref 80.0–100.0)
Monocytes Absolute: 0.8 10*3/uL (ref 0.1–1.0)
Monocytes Relative: 9 %
Neutro Abs: 6.5 10*3/uL (ref 1.7–7.7)
Neutrophils Relative %: 69 %
Platelet Count: 225 10*3/uL (ref 150–400)
RBC: 3.97 MIL/uL (ref 3.87–5.11)
RDW: 13.7 % (ref 11.5–15.5)
WBC Count: 9.3 10*3/uL (ref 4.0–10.5)
nRBC: 0 % (ref 0.0–0.2)

## 2021-04-30 LAB — CMP (CANCER CENTER ONLY)
ALT: 15 U/L (ref 0–44)
AST: 12 U/L — ABNORMAL LOW (ref 15–41)
Albumin: 3.7 g/dL (ref 3.5–5.0)
Alkaline Phosphatase: 99 U/L (ref 38–126)
Anion gap: 11 (ref 5–15)
BUN: 28 mg/dL — ABNORMAL HIGH (ref 8–23)
CO2: 25 mmol/L (ref 22–32)
Calcium: 9.3 mg/dL (ref 8.9–10.3)
Chloride: 104 mmol/L (ref 98–111)
Creatinine: 1.66 mg/dL — ABNORMAL HIGH (ref 0.44–1.00)
GFR, Estimated: 32 mL/min — ABNORMAL LOW (ref >60)
Glucose, Bld: 93 mg/dL (ref 70–99)
Potassium: 3.9 mmol/L (ref 3.5–5.1)
Sodium: 140 mmol/L (ref 135–145)
Total Bilirubin: 0.5 mg/dL (ref 0.3–1.2)
Total Protein: 7 g/dL (ref 6.5–8.1)

## 2021-04-30 LAB — GENETIC SCREENING ORDER

## 2021-04-30 MED ORDER — SODIUM CHLORIDE 0.9% FLUSH
10.0000 mL | Freq: Once | INTRAVENOUS | Status: AC
  Administered 2021-04-30: 10 mL

## 2021-04-30 MED ORDER — HEPARIN SOD (PORK) LOCK FLUSH 100 UNIT/ML IV SOLN
500.0000 [IU] | Freq: Once | INTRAVENOUS | Status: AC
  Administered 2021-04-30: 500 [IU]

## 2021-05-04 ENCOUNTER — Encounter: Payer: Self-pay | Admitting: Genetic Counselor

## 2021-05-04 ENCOUNTER — Encounter: Payer: Self-pay | Admitting: Oncology

## 2021-05-04 DIAGNOSIS — Z800 Family history of malignant neoplasm of digestive organs: Secondary | ICD-10-CM | POA: Insufficient documentation

## 2021-05-04 DIAGNOSIS — Z803 Family history of malignant neoplasm of breast: Secondary | ICD-10-CM

## 2021-05-04 DIAGNOSIS — Z8041 Family history of malignant neoplasm of ovary: Secondary | ICD-10-CM

## 2021-05-04 HISTORY — DX: Family history of malignant neoplasm of breast: Z80.3

## 2021-05-04 HISTORY — DX: Family history of malignant neoplasm of ovary: Z80.41

## 2021-05-04 NOTE — Progress Notes (Signed)
REFERRING PROVIDER: Gery Pray, MD [place],  [place] [id number]  PRIMARY PROVIDER:  [name], Rayford Halsted, MD  PRIMARY REASON FOR VISIT:  1. Family history of breast cancer   2. Family history of ovarian cancer   3. Cancer of renal pelvis, unspecified laterality ([place])   4. Family history of colon cancer   5. Primary cancer of right upper lobe of lung ([place])     HISTORY OF PRESENT ILLNESS:   Maria Marquez, a 75 y.o. female, was seen for a [facility] cancer genetics consultation at the request of Dr. Sondra Come due to a personal and family history of cancer.  Maria Marquez presents to clinic today to discuss the possibility of a hereditary predisposition to cancer, to discuss genetic testing, and to further clarify her future cancer risks, as well as potential cancer risks for family members.   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  In 2022, at the age of 64, Maria Marquez was diagnosed with primary cancer of right lung.  Maria Marquez also has a history of basal cell carcinoma in her 57s on her face.    CANCER HISTORY:  Oncology History  Cancer of renal pelvis, unspecified laterality ([place])  05/26/2020 Initial Diagnosis   Cancer of renal pelvis, unspecified laterality ([place])   06/13/2020 -  Chemotherapy   The patient had palonosetron (ALOXI) injection 0.25 mg, 0.25 mg, Intravenous,  Once, 3 of 3 cycles Administration: 0.25 mg (06/13/2020), 0.25 mg (07/04/2020), 0.25 mg (07/25/2020) CISplatin (PLATINOL) 80 mg in sodium chloride 0.9 % 250 mL chemo infusion, 35 mg/m2 = 80 mg (100 % of original dose 35 mg/m2), Intravenous,  Once, 3 of 3 cycles Dose modification: 35 mg/m2 (original dose 35 mg/m2, Cycle 1, Reason: Provider Judgment) Administration: 80 mg (06/13/2020), 80 mg (07/04/2020), 80 mg (07/25/2020) gemcitabine (GEMZAR) 2,280 mg in sodium chloride 0.9 % 250 mL chemo infusion, 1,000 mg/m2 = 2,280 mg (100 % of original dose  1,000 mg/m2), Intravenous,  Once, 4 of 4 cycles Dose modification: 1,000 mg/m2 (original dose 1,000 mg/m2, Cycle 1, Reason: Provider Judgment), 800 mg/m2 (original dose 1,000 mg/m2, Cycle 2, Reason: Provider Judgment) Administration: 2,280 mg (06/13/2020), 2,280 mg (06/20/2020), 1,824 mg (07/04/2020), 1,824 mg (07/11/2020), 1,824 mg (07/25/2020), 1,824 mg (08/01/2020), 1,824 mg (08/14/2020), 1,824 mg (08/21/2020) fosaprepitant (EMEND) 150 mg in sodium chloride 0.9 % 145 mL IVPB, 150 mg, Intravenous,  Once, 3 of 3 cycles Administration: 150 mg (06/13/2020), 150 mg (07/04/2020), 150 mg (07/25/2020)   for chemotherapy treatment.     Primary cancer of right upper lobe of lung ([place])  10/21/2020 Initial Diagnosis   Primary cancer of right upper lobe of lung ([place])   01/07/2021 Cancer Staging   Staging form: Lung, AJCC 8th Edition - Clinical: cT1, cN0, cM0 - Signed by [name], MD on 01/07/2021      RISK FACTORS:  Menarche was at age 69.  First live birth at age 82.  OCP use for approximately 4-5 years.  Ovaries intact: yes; unilateral cyst removed from ovary in early 74s.  Hysterectomy: no.  Menopausal status: postmenopausal.  HRT use: estradiol since age 35 Colonoscopy: yes;  most recent in 2017; plans to return in 2024 . Mammogram within the last year: yes; most recent 12/2020 Number of breast biopsies: 0. Up to date with pelvic exams: yes. Reported exposures: [place] water contamination   Past Medical History:  Diagnosis Date   Anemia  associated with chemotherapy    followed by dr Alen Blew   Bilateral lower extremity edema    per pt occasionally ankles swell   Carcinoma of renal pelvis, right [facility])    urologist-- dr winter/  oncologist-- dr Alen Blew;   dx 08/ 2021 ;  chemo 10/ 2021  to 12/ 2021   Chronic kidney disease, stage 3a ([place]) 12/31/2019   Dyspnea    12-26-2020  per pt no issue most of the time   Family history of breast cancer 05/04/2021   Family history of ovarian  cancer 05/04/2021   GERD (gastroesophageal reflux disease)    per pt occasionally takes tums   History of basal cell carcinoma (BCC) excision    per pt in 1970s on nose   History of chemotherapy    06-13-2020  to 12/ 2021  for right renal pelvis carcinoma   History of colon polyps    History of radiation therapy    11-13-2020 to 11-26-2020---   Right Lung- SBRT- Dr. Gery Pray   HLD (hyperlipidemia)    Hypertension    followed by pcp   Hypothyroidism, postsurgical 1979   followed by pcp   Leukocytosis    Nocturia    OA (osteoarthritis)    PONV (postoperative nausea and vomiting)    Pre-diabetes    Primary adenocarcinoma of upper lobe of right lung [facility])    oncologist--- shadad/  pulmonology-- dr byrum;  dx 02/ 2022;  s/p  SBRT 11-13-2020 to 11-26-2020   Urgency of urination    Wears partial dentures    lower    Past Surgical History:  Procedure Laterality Date   COLONOSCOPY  last one 2017   CYSTOSCOPY WITH URETEROSCOPY Right 04/18/2020   Procedure: CYSTOSCOPY WITH URETEROSCOPY, BIOPSY;  Surgeon: Ceasar Mons, MD;  Location: [facility];  Service: Urology;  Laterality: Right;  ONLY NEEDS 45 MIN   CYSTOSCOPY WITH URETEROSCOPY AND STENT PLACEMENT N/A 12/31/2020   Procedure: CYSTOSCOPY WITH RIGHT URETEROSCOPY/ BILATERAL RETROGRADE PYELOGRAM/RIGHT URETERAL BIOPSY/ LASER ABLATION/RIGHT STENT PLACEMENT;  Surgeon: Ceasar Mons, MD;  Location: [facility];  Service: Urology;  Laterality: N/A;   CYSTOSCOPY/RETROGRADE/URETEROSCOPY Right 04/22/2021   Procedure: CYSTOSCOPY/RETROGRADE/URETEROSCOPY/ STENT PLACEMENT;  Surgeon: Ceasar Mons, MD;  Location: [facility];  Service: Urology;  Laterality: Right;   EYE SURGERY Bilateral 1987   tear ducts   IR IMAGING GUIDED PORT INSERTION  06/04/2020   THYROID LOBECTOMY Right 1979   TOTAL HIP ARTHROPLASTY Right 01/28/2016   Procedure: RIGHT TOTAL HIP ARTHROPLASTY ANTERIOR APPROACH;   Surgeon: [name], MD;  Location: [facility];  Service: Orthopedics;  Laterality: Right;   UMBILICAL HERNIA REPAIR  1980s   VIDEO BRONCHOSCOPY WITH ENDOBRONCHIAL NAVIGATION N/A 06/06/2020   Procedure: VIDEO BRONCHOSCOPY WITH ENDOBRONCHIAL NAVIGATION;  Surgeon: Collene Gobble, MD;  Location: [facility];  Service: Thoracic;  Laterality: N/A;   VIDEO BRONCHOSCOPY WITH ENDOBRONCHIAL NAVIGATION N/A 10/10/2020   Procedure: VIDEO BRONCHOSCOPY WITH ENDOBRONCHIAL NAVIGATION;  Surgeon: [name], MD;  Location: [place] [facility];  Service: Thoracic;  Laterality: N/A;    Social History   Socioeconomic History   Marital status: Widowed    Spouse name: Not on file   Number of children: Not on file   Years of education: Not on file   Highest education level: Not on file  Occupational History   Occupation: Retired  Tobacco Use   Smoking status: Former    Packs/day: 1.00    Years: 45.00  Pack years: 45.00    Types: Cigarettes    Quit date: 04/18/2020    Years since quitting: 1.0   Smokeless tobacco: Never  Vaping Use   Vaping Use: Never used  Substance and Sexual Activity   Alcohol use: Not Currently    Comment: rarely   Drug use: No   Sexual activity: Never  Other Topics Concern   Not on file  Social History Narrative   Not on file   Social Determinants of Health   Financial Resource Strain: Not on file  Food Insecurity: Not on file  Transportation Needs: Not on file  Physical Activity: Not on file  Stress: Not on file  Social Connections: Not on file     FAMILY HISTORY:  We obtained a detailed, 4-generation family history.  Significant diagnoses are listed below: Family History  Problem Relation Age of Onset   Breast cancer Mother 67   Lung cancer Brother        dx > 22; work-related exposures   Lung cancer Maternal Aunt        dx > 81   Colon cancer Paternal Aunt        dx > 50   Head & neck cancer Paternal Uncle        dx 5s   Ovarian cancer Daughter 58   Breast  cancer Niece 72   Leukemia Cousin        d. 32s    Maria Marquez is unaware of previous family history of genetic testing for hereditary cancer risks besides that mentioned above. There is no reported Ashkenazi Jewish ancestry. There is no known consanguinity.  GENETIC COUNSELING ASSESSMENT: Maria Marquez is a 75 y.o. female with a personal and family history of cancer which is somewhat suggestive of a hereditary cancer syndrome and predisposition to cancer given the presence of related cancers in the family and the ages of diagnosis in several family members. We, therefore, discussed and recommended the following at today's visit.   DISCUSSION: We discussed that 5 - 10% of cancer is hereditary, with most cases of hereditary breast/ovarian associated with mutations in BRCA1/2.  Most hereditary cases of colon and renal pelvis cancer are associated with mutations in the Lynch syndrome genes.  There are other genes that can be associated with hereditary cancer syndromes.  Type of cancer risk and level of risk are gene-specific.  We discussed that testing is beneficial for several reasons including knowing how to follow individuals after completing their treatment, identifying whether potential treatment options  would be beneficial, and understanding if other family members could be at risk for cancer and allowing them to undergo genetic testing.   We reviewed the characteristics, features and inheritance patterns of hereditary cancer syndromes. We also discussed genetic testing, including the appropriate family members to test, the process of testing, insurance coverage and turn-around-time for results. We discussed the implications of a negative, positive, carrier and/or variant of uncertain significant result. We recommended Maria Marquez pursue genetic testing for a panel that includes genes associated with breast, ovarian, and renal pelvis cancer.   The Multi-Cancer + RNA Panel offered by [facility]  includes sequencing and/or deletion/duplication analysis of the following 84 genes:  AIP*, ALK, APC*, ATM*, AXIN2*, BAP1*, BARD1*, BLM*, BMPR1A*, BRCA1*, BRCA2*, BRIP1*, CASR, CDC73*, CDH1*, CDK4, CDKN1B*, CDKN1C*, CDKN2A, CEBPA, CHEK2*, CTNNA1*, DICER1*, DIS3L2*, EGFR, EPCAM, FH*, FLCN*, GATA2*, GPC3, GREM1, HOXB13, HRAS, KIT, MAX*, MEN1*, MET, MITF, MLH1*, MSH2*, MSH3*, MSH6*, MUTYH*, NBN*, NF1*, NF2*, NTHL1*, PALB2*, PDGFRA,  PHOX2B, PMS2*, POLD1*, POLE*, POT1*, PRKAR1A*, PTCH1*, PTEN*, RAD50*, RAD51C*, RAD51D*, RB1*, RECQL4, RET, RUNX1*, SDHA*, SDHAF2*, SDHB*, SDHC*, SDHD*, SMAD4*, SMARCA4*, SMARCB1*, SMARCE1*, STK11*, SUFU*, TERC, TERT, TMEM127*, Tp53*, TSC1*, TSC2*, VHL*, WRN*, and WT1.  RNA analysis is performed for * genes.   Based on Maria Marquez's personal and family history of cancer, she meets medical criteria for genetic testing. Despite that she meets criteria, she may still have an out of pocket cost. We discussed that if her out of pocket cost for testing is over $100, the laboratory may reach out to her discuss self-pay options and patient pay assistance programs.    PLAN: After considering the risks, benefits, and limitations, Maria Marquez provided informed consent to pursue genetic testing and the blood sample was sent to [facility] for analysis of the Multi-Cancer +RNA Panel. Results should be available within approximately 3 weeks' time, at which point they will be disclosed by telephone to Maria Marquez, as will any additional recommendations warranted by these results. Maria Marquez will receive a summary of her genetic counseling visit and a copy of her results once available. This information will also be available in Epic.   Lastly, we encouraged Maria Marquez to remain in contact with cancer genetics annually so that we can continuously update the family history and inform her of any changes in cancer genetics and testing that may be of benefit for this family.   Ms.  Marquez questions were answered to her satisfaction today. Our contact information was provided should additional questions or concerns arise. Thank you for the referral and allowing [name] to share in the care of your patient.   Keylon Labelle M. Joette Catching, [place], [facility] Genetic Counselor [email or web address] (P) [phone]  The patient was seen for a total of 40 minutes in face-to-face genetic counseling.  The patient was seen alone.  Drs. Magrinat, Lindi Adie and/or Burr Medico were available to discuss this case as needed.    _______________________________________________________________________ For Office Staff:  Number of people involved in session: 1 Was an Intern/ student involved with case: no

## 2021-05-12 ENCOUNTER — Ambulatory Visit (INDEPENDENT_AMBULATORY_CARE_PROVIDER_SITE_OTHER)
Admission: RE | Admit: 2021-05-12 | Discharge: 2021-05-12 | Disposition: A | Discharge status: Home / Self Care | Payer: Medicare HMO | Source: Ambulatory Visit | Attending: Internal Medicine | Admitting: Internal Medicine

## 2021-05-12 ENCOUNTER — Other Ambulatory Visit: Payer: Self-pay

## 2021-05-12 DIAGNOSIS — Z13820 Encounter for screening for osteoporosis: Secondary | ICD-10-CM

## 2021-05-12 DIAGNOSIS — Z780 Asymptomatic menopausal state: Secondary | ICD-10-CM | POA: Diagnosis not present

## 2021-05-14 ENCOUNTER — Inpatient Hospital Stay: Payer: Medicare HMO

## 2021-05-14 ENCOUNTER — Other Ambulatory Visit: Payer: Self-pay

## 2021-05-14 DIAGNOSIS — Z87891 Personal history of nicotine dependence: Secondary | ICD-10-CM | POA: Diagnosis not present

## 2021-05-14 DIAGNOSIS — E039 Hypothyroidism, unspecified: Secondary | ICD-10-CM | POA: Diagnosis not present

## 2021-05-14 DIAGNOSIS — Z801 Family history of malignant neoplasm of trachea, bronchus and lung: Secondary | ICD-10-CM | POA: Diagnosis not present

## 2021-05-14 DIAGNOSIS — Z803 Family history of malignant neoplasm of breast: Secondary | ICD-10-CM | POA: Diagnosis not present

## 2021-05-14 DIAGNOSIS — C3411 Malignant neoplasm of upper lobe, right bronchus or lung: Secondary | ICD-10-CM | POA: Diagnosis not present

## 2021-05-14 DIAGNOSIS — Z79899 Other long term (current) drug therapy: Secondary | ICD-10-CM | POA: Diagnosis not present

## 2021-05-14 DIAGNOSIS — Z8553 Personal history of malignant neoplasm of renal pelvis: Secondary | ICD-10-CM | POA: Diagnosis not present

## 2021-05-14 DIAGNOSIS — E785 Hyperlipidemia, unspecified: Secondary | ICD-10-CM | POA: Diagnosis not present

## 2021-05-14 DIAGNOSIS — I10 Essential (primary) hypertension: Secondary | ICD-10-CM | POA: Diagnosis not present

## 2021-05-14 DIAGNOSIS — Z95828 Presence of other vascular implants and grafts: Secondary | ICD-10-CM

## 2021-05-14 MED ORDER — HEPARIN SOD (PORK) LOCK FLUSH 100 UNIT/ML IV SOLN
500.0000 [IU] | Freq: Once | INTRAVENOUS | Status: AC
  Administered 2021-05-14: 500 [IU]

## 2021-05-14 MED ORDER — SODIUM CHLORIDE 0.9% FLUSH
10.0000 mL | Freq: Once | INTRAVENOUS | Status: AC
  Administered 2021-05-14: 10 mL

## 2021-06-02 ENCOUNTER — Encounter: Payer: Self-pay | Admitting: Genetic Counselor

## 2021-06-02 ENCOUNTER — Ambulatory Visit: Payer: Self-pay | Admitting: Genetic Counselor

## 2021-06-02 ENCOUNTER — Telehealth: Payer: Self-pay | Admitting: Genetic Counselor

## 2021-06-02 DIAGNOSIS — C659 Malignant neoplasm of unspecified renal pelvis: Secondary | ICD-10-CM

## 2021-06-02 DIAGNOSIS — Z800 Family history of malignant neoplasm of digestive organs: Secondary | ICD-10-CM

## 2021-06-02 DIAGNOSIS — C3490 Malignant neoplasm of unspecified part of unspecified bronchus or lung: Secondary | ICD-10-CM

## 2021-06-02 DIAGNOSIS — Z803 Family history of malignant neoplasm of breast: Secondary | ICD-10-CM

## 2021-06-02 DIAGNOSIS — Z8041 Family history of malignant neoplasm of ovary: Secondary | ICD-10-CM

## 2021-06-02 DIAGNOSIS — Z1379 Encounter for other screening for genetic and chromosomal anomalies: Secondary | ICD-10-CM

## 2021-06-02 NOTE — Progress Notes (Signed)
HPI:  Maria Marquez was previously seen in the [facility] clinic due to a personal and family history of cancer and concerns regarding a hereditary predisposition to cancer. Please refer to our prior cancer genetics clinic note for more information regarding our discussion, assessment and recommendations, at the time. Maria Marquez recent genetic test results were disclosed to her, as were recommendations warranted by these results. These results and recommendations are discussed in more detail below.  CANCER HISTORY:  Oncology History  Cancer of renal pelvis, unspecified laterality ([place])  05/26/2020 Initial Diagnosis   Cancer of renal pelvis, unspecified laterality ([place])   06/13/2020 -  Chemotherapy   The patient had palonosetron (ALOXI) injection 0.25 mg, 0.25 mg, Intravenous,  Once, 3 of 3 cycles Administration: 0.25 mg (06/13/2020), 0.25 mg (07/04/2020), 0.25 mg (07/25/2020) CISplatin (PLATINOL) 80 mg in sodium chloride 0.9 % 250 mL chemo infusion, 35 mg/m2 = 80 mg (100 % of original dose 35 mg/m2), Intravenous,  Once, 3 of 3 cycles Dose modification: 35 mg/m2 (original dose 35 mg/m2, Cycle 1, Reason: Provider Judgment) Administration: 80 mg (06/13/2020), 80 mg (07/04/2020), 80 mg (07/25/2020) gemcitabine (GEMZAR) 2,280 mg in sodium chloride 0.9 % 250 mL chemo infusion, 1,000 mg/m2 = 2,280 mg (100 % of original dose 1,000 mg/m2), Intravenous,  Once, 4 of 4 cycles Dose modification: 1,000 mg/m2 (original dose 1,000 mg/m2, Cycle 1, Reason: Provider Judgment), 800 mg/m2 (original dose 1,000 mg/m2, Cycle 2, Reason: Provider Judgment) Administration: 2,280 mg (06/13/2020), 2,280 mg (06/20/2020), 1,824 mg (07/04/2020), 1,824 mg (07/11/2020), 1,824 mg (07/25/2020), 1,824 mg (08/01/2020), 1,824 mg (08/14/2020), 1,824 mg (08/21/2020) fosaprepitant (EMEND) 150 mg in sodium chloride 0.9 % 145 mL IVPB, 150 mg, Intravenous,  Once, 3 of 3 cycles Administration: 150 mg (06/13/2020), 150 mg  (07/04/2020), 150 mg (07/25/2020)   for chemotherapy treatment.     06/01/2021 Genetic Testing   Negative genetic testing: no pathogenic variants detected in [facility] Multi-Cancer +RNA Panel.  Variant of uncertain significance detected in RECQL4 at c.3056-5G>A (Intronic).  The report date is June 01, 2021.  The Multi-Cancer + RNA Panel offered by [facility] includes sequencing and/or deletion/duplication analysis of the following 84 genes:  AIP*, ALK, APC*, ATM*, AXIN2*, BAP1*, BARD1*, BLM*, BMPR1A*, BRCA1*, BRCA2*, BRIP1*, CASR, CDC73*, CDH1*, CDK4, CDKN1B*, CDKN1C*, CDKN2A, CEBPA, CHEK2*, CTNNA1*, DICER1*, DIS3L2*, EGFR, EPCAM, FH*, FLCN*, GATA2*, GPC3, GREM1, HOXB13, HRAS, KIT, MAX*, MEN1*, MET, MITF, MLH1*, MSH2*, MSH3*, MSH6*, MUTYH*, NBN*, NF1*, NF2*, NTHL1*, PALB2*, PDGFRA, PHOX2B, PMS2*, POLD1*, POLE*, POT1*, PRKAR1A*, PTCH1*, PTEN*, RAD50*, RAD51C*, RAD51D*, RB1*, RECQL4, RET, RUNX1*, SDHA*, SDHAF2*, SDHB*, SDHC*, SDHD*, SMAD4*, SMARCA4*, SMARCB1*, SMARCE1*, STK11*, SUFU*, TERC, TERT, TMEM127*, Tp53*, TSC1*, TSC2*, VHL*, WRN*, and WT1.  RNA analysis is performed for * genes.   Primary cancer of right upper lobe of lung ([place])  10/21/2020 Initial Diagnosis   Primary cancer of right upper lobe of lung ([place])   01/07/2021 Cancer Staging   Staging form: Lung, AJCC 8th Edition - Clinical: cT1, cN0, cM0 - Signed by [name], MD on 01/07/2021   06/01/2021 Genetic Testing   Negative genetic testing: no pathogenic variants detected in [facility] Multi-Cancer +RNA Panel.  Variant of uncertain significance detected in RECQL4 at c.3056-5G>A (Intronic).  The report date is June 01, 2021.  The Multi-Cancer + RNA Panel offered by [facility] includes sequencing and/or deletion/duplication analysis of the following 84 genes:  AIP*, ALK, APC*, ATM*, AXIN2*, BAP1*, BARD1*, BLM*, BMPR1A*, BRCA1*, BRCA2*, BRIP1*, CASR, CDC73*, CDH1*, CDK4, CDKN1B*, CDKN1C*, CDKN2A, CEBPA, CHEK2*, CTNNA1*,  DICER1*, DIS3L2*,  EGFR, EPCAM, FH*, FLCN*, GATA2*, GPC3, GREM1, HOXB13, HRAS, KIT, MAX*, MEN1*, MET, MITF, MLH1*, MSH2*, MSH3*, MSH6*, MUTYH*, NBN*, NF1*, NF2*, NTHL1*, PALB2*, PDGFRA, PHOX2B, PMS2*, POLD1*, POLE*, POT1*, PRKAR1A*, PTCH1*, PTEN*, RAD50*, RAD51C*, RAD51D*, RB1*, RECQL4, RET, RUNX1*, SDHA*, SDHAF2*, SDHB*, SDHC*, SDHD*, SMAD4*, SMARCA4*, SMARCB1*, SMARCE1*, STK11*, SUFU*, TERC, TERT, TMEM127*, Tp53*, TSC1*, TSC2*, VHL*, WRN*, and WT1.  RNA analysis is performed for * genes.       FAMILY HISTORY:  We obtained a detailed, 4-generation family history.  Significant diagnoses are listed below:      Family History  Problem Relation Age of Onset   Breast cancer Mother 102   Lung cancer Brother          dx > 25; work-related exposures   Lung cancer Maternal Aunt          dx > 75   Colon cancer Paternal Aunt          dx > 53   Head & neck cancer Paternal Uncle          dx 46s   Ovarian cancer Daughter 46   Breast cancer Niece 36   Leukemia Cousin          d. 46s    Maria Marquez is unaware of previous family history of genetic testing for hereditary cancer risks besides that mentioned above. There is no reported Ashkenazi Jewish ancestry. There is no known consanguinity.    GENETIC TEST RESULTS: Genetic testing reported out on June 01, 2021. The Multi-Cancer +RNA Panel found no pathogenic mutations. The Multi-Cancer + RNA Panel offered by [facility] includes sequencing and/or deletion/duplication analysis of the following 84 genes:  AIP*, ALK, APC*, ATM*, AXIN2*, BAP1*, BARD1*, BLM*, BMPR1A*, BRCA1*, BRCA2*, BRIP1*, CASR, CDC73*, CDH1*, CDK4, CDKN1B*, CDKN1C*, CDKN2A, CEBPA, CHEK2*, CTNNA1*, DICER1*, DIS3L2*, EGFR, EPCAM, FH*, FLCN*, GATA2*, GPC3, GREM1, HOXB13, HRAS, KIT, MAX*, MEN1*, MET, MITF, MLH1*, MSH2*, MSH3*, MSH6*, MUTYH*, NBN*, NF1*, NF2*, NTHL1*, PALB2*, PDGFRA, PHOX2B, PMS2*, POLD1*, POLE*, POT1*, PRKAR1A*, PTCH1*, PTEN*, RAD50*, RAD51C*, RAD51D*, RB1*, RECQL4, RET, RUNX1*, SDHA*, SDHAF2*,  SDHB*, SDHC*, SDHD*, SMAD4*, SMARCA4*, SMARCB1*, SMARCE1*, STK11*, SUFU*, TERC, TERT, TMEM127*, Tp53*, TSC1*, TSC2*, VHL*, WRN*, and WT1.  RNA analysis is performed for * genes.  The test report has been scanned into EPIC and is located under the Molecular Pathology section of the Results Review tab.  A portion of the result report is included below for reference.     We discussed with Maria Marquez that because current genetic testing is not perfect, it is possible there may be a gene mutation in one of these genes that current testing cannot detect, but that chance is small.  We also discussed, that there could be another gene that has not yet been discovered, or that we have not yet tested, that is responsible for the cancer diagnoses in the family. It is also possible there is a hereditary cause for the cancer in the family that Maria Marquez did not inherit and therefore was not identified in her testing.  Therefore, it is important to remain in touch with cancer genetics in the future so that we can continue to offer Maria Marquez the most up to date genetic testing.   Genetic testing did identify a variant of uncertain significance (VUS) in the RECQL4 gene called c.3056-5G>A (intronic).  At this time, it is unknown if this variant is associated with increased cancer risk or if this is a normal finding, but most variants such as this get  reclassified to being inconsequential. It should not be used to make medical management decisions. With time, we suspect the lab will determine the significance of this variant, if any. If we do learn more about it, we will try to contact Maria Marquez to discuss it further. However, it is important to stay in touch with [name] periodically and keep the address and phone number up to date.  ADDITIONAL GENETIC TESTING:  We discussed with Maria Marquez that her genetic testing was fairly extensive.  If there are genes identified to increase cancer risk that can be analyzed in  the future, we would be happy to discuss and coordinate this testing at that time.    CANCER SCREENING RECOMMENDATIONS: Maria Marquez test result is considered negative (normal).  This means that we have not identified a hereditary cause for her personal history of cancer at this time. Most cancers are sporadic or familial, and this negative test suggests that her cancer may fall into those categories.     While reassuring, this does not definitively rule out a hereditary predisposition to cancer. It is still possible that there could be genetic mutations that are undetectable by current technology. There could be genetic mutations in genes that have not been tested or identified to increase cancer risk.  Therefore, it is recommended she continue to follow the cancer management and screening guidelines provided by her oncology and primary healthcare provider.   An individual's cancer risk and medical management are not determined by genetic test results alone. Overall cancer risk assessment incorporates additional factors, including personal medical history, family history, and any available genetic information that may result in a personalized plan for cancer prevention and surveillance  RECOMMENDATIONS FOR FAMILY MEMBERS:  Individuals in this family might be at some increased risk of developing cancer, over the general population risk, simply due to the family history of cancer.  We recommended women in this family have a yearly mammogram beginning at age 16, or 60 years younger than the earliest onset of cancer, an annual clinical breast exam, and perform monthly breast self-exams. Women in this family should also have a gynecological exam as recommended by their primary provider. Family members should be referred for colonoscopy starting at age 54, or earlier, as recommended by their providers. Family members should inform their providers about the family history of cancer.    It is also possible  there is a hereditary cause for the cancer in Maria Marquez's family that she did not inherit and therefore was not identified in her.  Based on Maria Marquez's family history, we recommended her daughter, who was diagnosed with ovarian cancer, and her niece, who was diagnosed with breast cancer before age 44, have genetic counseling and testing. Maria Marquez stated that her daughter's genetic testing is pending. Maria Marquez will let us know if we can be of any assistance in coordinating genetic counseling and/or testing for family members.   FOLLOW-UP: Lastly, we discussed with Ms. Presas that cancer genetics is a rapidly advancing field and it is possible that new genetic tests will be appropriate for her and/or her family members in the future. We encouraged her to remain in contact with cancer genetics on an annual basis so we can update her personal and family histories and let her know of advances in cancer genetics that may benefit this family.   Our contact number was provided. Ms. Strack questions were answered to her satisfaction, and she knows she is welcome to call us at  anytime with additional questions or concerns.     Maria Marquez M. Joette Catching, [place], [facility] Genetic Counselor [email or web address] (P) [phone]

## 2021-06-02 NOTE — Telephone Encounter (Signed)
Revealed negative genetic testing and variant of uncertain significance in REC[facility].  Discussed that we do not know why she has  cancer or why there is cancer in the family. It could be familial, due to a different gene that we are not testing, or maybe our current technology may not be able to pick something up.  It will be important for her to keep in contact with genetics to keep up with whether additional testing may be needed.  Recommendation genetic testing for daughter with ovarian cancer history and niece with breast cancer history.

## 2021-06-08 ENCOUNTER — Encounter: Payer: Self-pay | Admitting: Internal Medicine

## 2021-06-22 ENCOUNTER — Other Ambulatory Visit: Payer: Medicare HMO

## 2021-06-30 ENCOUNTER — Other Ambulatory Visit: Payer: Medicare HMO

## 2021-07-03 ENCOUNTER — Other Ambulatory Visit: Payer: Self-pay

## 2021-07-03 ENCOUNTER — Inpatient Hospital Stay: Payer: Medicare HMO | Attending: Oncology

## 2021-07-03 ENCOUNTER — Other Ambulatory Visit: Payer: Self-pay | Admitting: Oncology

## 2021-07-03 ENCOUNTER — Ambulatory Visit (HOSPITAL_COMMUNITY)
Admission: RE | Admit: 2021-07-03 | Discharge: 2021-07-03 | Disposition: A | Discharge status: Home / Self Care | Payer: Medicare HMO | Source: Ambulatory Visit | Attending: Oncology | Admitting: Oncology

## 2021-07-03 DIAGNOSIS — Z8553 Personal history of malignant neoplasm of renal pelvis: Secondary | ICD-10-CM | POA: Insufficient documentation

## 2021-07-03 DIAGNOSIS — C3490 Malignant neoplasm of unspecified part of unspecified bronchus or lung: Secondary | ICD-10-CM | POA: Insufficient documentation

## 2021-07-03 DIAGNOSIS — C3411 Malignant neoplasm of upper lobe, right bronchus or lung: Secondary | ICD-10-CM | POA: Insufficient documentation

## 2021-07-03 DIAGNOSIS — C659 Malignant neoplasm of unspecified renal pelvis: Secondary | ICD-10-CM | POA: Diagnosis not present

## 2021-07-03 DIAGNOSIS — Z95828 Presence of other vascular implants and grafts: Secondary | ICD-10-CM

## 2021-07-03 DIAGNOSIS — C641 Malignant neoplasm of right kidney, except renal pelvis: Secondary | ICD-10-CM | POA: Diagnosis not present

## 2021-07-03 DIAGNOSIS — K8020 Calculus of gallbladder without cholecystitis without obstruction: Secondary | ICD-10-CM | POA: Diagnosis not present

## 2021-07-03 DIAGNOSIS — K429 Umbilical hernia without obstruction or gangrene: Secondary | ICD-10-CM | POA: Diagnosis not present

## 2021-07-03 DIAGNOSIS — K449 Diaphragmatic hernia without obstruction or gangrene: Secondary | ICD-10-CM | POA: Diagnosis not present

## 2021-07-03 LAB — CMP (CANCER CENTER ONLY)
ALT: 13 U/L (ref 0–44)
AST: 12 U/L — ABNORMAL LOW (ref 15–41)
Albumin: 3.6 g/dL (ref 3.5–5.0)
Alkaline Phosphatase: 93 U/L (ref 38–126)
Anion gap: 12 (ref 5–15)
BUN: 25 mg/dL — ABNORMAL HIGH (ref 8–23)
CO2: 24 mmol/L (ref 22–32)
Calcium: 8.9 mg/dL (ref 8.9–10.3)
Chloride: 104 mmol/L (ref 98–111)
Creatinine: 1.35 mg/dL — ABNORMAL HIGH (ref 0.44–1.00)
GFR, Estimated: 41 mL/min — ABNORMAL LOW (ref >60)
Glucose, Bld: 108 mg/dL — ABNORMAL HIGH (ref 70–99)
Potassium: 3.9 mmol/L (ref 3.5–5.1)
Sodium: 140 mmol/L (ref 135–145)
Total Bilirubin: 0.7 mg/dL (ref 0.3–1.2)
Total Protein: 6.8 g/dL (ref 6.5–8.1)

## 2021-07-03 LAB — CBC WITH DIFFERENTIAL (CANCER CENTER ONLY)
Abs Immature Granulocytes: 0.02 10*3/uL (ref 0.00–0.07)
Basophils Absolute: 0 10*3/uL (ref 0.0–0.1)
Basophils Relative: 0 %
Eosinophils Absolute: 0.2 10*3/uL (ref 0.0–0.5)
Eosinophils Relative: 3 %
HCT: 36.5 % (ref 36.0–46.0)
Hemoglobin: 12.1 g/dL (ref 12.0–15.0)
Immature Granulocytes: 0 %
Lymphocytes Relative: 18 %
Lymphs Abs: 1.3 10*3/uL (ref 0.7–4.0)
MCH: 30.6 pg (ref 26.0–34.0)
MCHC: 33.2 g/dL (ref 30.0–36.0)
MCV: 92.4 fL (ref 80.0–100.0)
Monocytes Absolute: 0.6 10*3/uL (ref 0.1–1.0)
Monocytes Relative: 9 %
Neutro Abs: 5.2 10*3/uL (ref 1.7–7.7)
Neutrophils Relative %: 70 %
Platelet Count: 217 10*3/uL (ref 150–400)
RBC: 3.95 MIL/uL (ref 3.87–5.11)
RDW: 13.8 % (ref 11.5–15.5)
WBC Count: 7.4 10*3/uL (ref 4.0–10.5)
nRBC: 0 % (ref 0.0–0.2)

## 2021-07-03 MED ORDER — HEPARIN SOD (PORK) LOCK FLUSH 100 UNIT/ML IV SOLN
INTRAVENOUS | Status: AC
  Administered 2021-07-03: 500 [IU] via INTRAVENOUS
  Filled 2021-07-03: qty 5

## 2021-07-03 MED ORDER — LIDOCAINE-PRILOCAINE 2.5-2.5 % EX CREA: 1.0000 "application " | TOPICAL_CREAM | CUTANEOUS | 0 refills | Status: DC | PRN

## 2021-07-03 MED ORDER — SODIUM CHLORIDE 0.9% FLUSH
10.0000 mL | Freq: Once | INTRAVENOUS | Status: AC
  Administered 2021-07-03: 10 mL

## 2021-07-03 MED ORDER — HEPARIN SOD (PORK) LOCK FLUSH 100 UNIT/ML IV SOLN: 500.0000 [IU] | Freq: Once | INTRAVENOUS | Status: AC

## 2021-07-04 ENCOUNTER — Encounter: Payer: Self-pay | Admitting: Oncology

## 2021-07-06 ENCOUNTER — Other Ambulatory Visit: Payer: Self-pay | Admitting: *Deleted

## 2021-07-06 ENCOUNTER — Telehealth: Payer: Self-pay

## 2021-07-06 DIAGNOSIS — Z95828 Presence of other vascular implants and grafts: Secondary | ICD-10-CM

## 2021-07-06 MED ORDER — LIDOCAINE-PRILOCAINE 2.5-2.5 % EX CREA: 1.0000 "application " | TOPICAL_CREAM | CUTANEOUS | 0 refills | Status: DC | PRN

## 2021-07-06 NOTE — Telephone Encounter (Signed)
Mrs. Duff called because there is an inaccuracy in her medical record.  She stated her diagnosis was August 2021 NOT August 2020.  Her CT scan states different and she would like for this to be changed to be accurate with her diagnosis date. This is what the report and prior reports state.  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  I have routed this to Dr. Alen Blew and Yetta Glassman to see how this can be changed on all her previous scans. Gardiner Rhyme, RN

## 2021-07-07 ENCOUNTER — Encounter: Payer: Self-pay | Admitting: Oncology

## 2021-07-07 ENCOUNTER — Other Ambulatory Visit: Payer: Self-pay

## 2021-07-07 ENCOUNTER — Ambulatory Visit: Payer: Medicare HMO | Admitting: Oncology

## 2021-07-07 ENCOUNTER — Inpatient Hospital Stay (HOSPITAL_BASED_OUTPATIENT_CLINIC_OR_DEPARTMENT_OTHER): Payer: Medicare HMO | Admitting: Oncology

## 2021-07-07 VITALS — BP 150/71 | HR 71 | Temp 97.2°F | Resp 18 | Ht 66.0 in | Wt 270.8 lb

## 2021-07-07 DIAGNOSIS — Z8553 Personal history of malignant neoplasm of renal pelvis: Secondary | ICD-10-CM | POA: Diagnosis not present

## 2021-07-07 DIAGNOSIS — C3411 Malignant neoplasm of upper lobe, right bronchus or lung: Secondary | ICD-10-CM | POA: Diagnosis not present

## 2021-07-07 DIAGNOSIS — C3490 Malignant neoplasm of unspecified part of unspecified bronchus or lung: Secondary | ICD-10-CM

## 2021-07-07 NOTE — Progress Notes (Signed)
Hematology and Oncology Follow Up   Maria Marquez [id number] September 22, 1945 75 y.o. 07/07/2021 2:02 PM Maria Marquez, Maria Marquez, MDHernandez Lucilla Marquez*      Principle Diagnosis: 75 year old woman with:  1.  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  She presented with T2N0 high-grade urothelial carcinoma.    2.  Lung cancer diagnosed in February 2022.  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        Prior Therapy:    She is status post cystoscopy and urethroscopy and a biopsy of her renal pelvic mass completed by Dr. Lovena Neighbours in April 18, 2020.  This showed high-grade urothelial carcinoma.   She is status post bronchoscopy and a biopsy of her right upper lung mass which was not diagnostic.   Gemcitabine and cisplatin chemotherapy started on June 13, 2020.  She completed 3 cycles and cycle 4 was given without cisplatin completed in December 2021.  She is status post bronchoscopy and a biopsy completed by Dr. Roxan Hockey on October 10, 2020 which confirmed the presence of primary lung neoplasm.  SBRT to right lung cancer between March 24 to November 26, 2020.  She received total of 50 Gray and 10 fractions.  She is status post repeat cystoscopy in and biopsy as well as complete laser tumor ablation completed on Dec 31, 2020.  Final pathology showed low-grade urothelial carcinoma.  This without muscle invasive disease.   Current therapy: Active surveillance.  Interim History: Maria Marquez returns today for a repeat evaluation.  Since her last visit, she reports feeling well without any major complaints.  She denies any recent hospitalizations or illnesses.  She denies any abdominal pain or hematuria.  Formal status quality of life remains unchanged.  Denies any residual issues related to previous chemotherapy exposure.  .    Medications: Reviewed without changes. Current Outpatient Medications  Medication Sig Dispense Refill   acetaminophen  (TYLENOL) 500 MG tablet Take 500-1,500 mg by mouth every 8 (eight) hours as needed for moderate pain.     albuterol (VENTOLIN HFA) 108 (90 Base) MCG/ACT inhaler Inhale 1-2 puffs into the lungs every 6 (six) hours as needed for wheezing or shortness of breath. 18 g 2   aspirin EC 81 MG tablet Take 81 mg by mouth daily. Swallow whole.     atorvastatin (LIPITOR) 20 MG tablet Take 1 tablet (20 mg total) by mouth daily. 90 tablet 1   calcium carbonate (TUMS - DOSED IN MG ELEMENTAL CALCIUM) 500 MG chewable tablet Chew 500 mg by mouth daily as needed for indigestion or heartburn.     EPINEPHrine 0.3 mg/0.3 mL IJ SOAJ injection Inject 0.3 mg into the muscle as needed for anaphylaxis. 1 each 2   estradiol-norethindrone (ACTIVELLA) 1-0.5 MG tablet Take 1 tablet by mouth daily. (Patient taking differently: Take 1 tablet by mouth every other day. Takes in evening) 28 tablet 5   ezetimibe (ZETIA) 10 MG tablet TAKE 1 TABLET EVERY DAY (Patient taking differently: Take 10 mg by mouth at bedtime.) 90 tablet 1   levothyroxine (SYNTHROID) 125 MCG tablet TAKE 1 TABLET AT BEDTIME (Patient taking differently: Take 125 mcg by mouth at bedtime.) 90 tablet 1   lidocaine-prilocaine (EMLA) cream Apply 1 application topically as needed. 30 g 0   losartan-hydrochlorothiazide (HYZAAR) 50-12.5 MG tablet TAKE 1 TABLET EVERY DAY (Patient taking differently: Take 1 tablet by mouth daily.) 90 tablet 3   SULFAMETHOXAZOLE-TRIMETHOPRIM PO Take 1 tablet by mouth 2 (two) times daily.  vitamin B-12 (CYANOCOBALAMIN) 1000 MCG tablet Take 1,000 mcg by mouth daily.     XIIDRA 5 % SOLN Place 1 drop into both eyes 2 (two) times daily as needed (dry eyes).     No current facility-administered medications for this visit.     Allergies:  Allergies  Allergen Reactions   Iodine Shortness Of Breath   Keflex [Cephalexin] Shortness Of Breath, Rash and Tinitus   Shellfish Allergy Hives   Lasix [Furosemide] Itching   Rosuvastatin Other (See  Comments)    Muscle Pain in Thighs   Latex Rash   Lisinopril Cough   Sulfa Antibiotics Nausea Only   Tramadol Itching   Physical exam:   Blood pressure (!) 150/71, pulse 71, temperature (!) 97.2 F (36.2 C), temperature source Tympanic, resp. rate 18, height 5\' 6"  (1.676 m), weight 270 lb 12.8 oz (122.8 kg), SpO2 99 %.    ECOG 1    General appearance: Alert, awake without any distress. Head: Atraumatic without abnormalities Oropharynx: Without any thrush or ulcers. Eyes: No scleral icterus. Lymph nodes: No lymphadenopathy noted in the cervical, supraclavicular, or axillary nodes Heart:regular rate and rhythm, without any murmurs or gallops.   Lung: Clear to auscultation without any rhonchi, wheezes or dullness to percussion. Abdomin: Soft, nontender without any shifting dullness or ascites. Musculoskeletal: No clubbing or cyanosis. Neurological: No motor or sensory deficits. Skin: No rashes or lesions.       Lab Results: Lab Results  Component Value Date   WBC 7.4 07/03/2021   HGB 12.1 07/03/2021   HCT 36.5 07/03/2021   MCV 92.4 07/03/2021   PLT 217 07/03/2021     Chemistry      Component Value Date/Time   NA 140 07/03/2021 1206   NA 140 12/19/2019 1132   K 3.9 07/03/2021 1206   CL 104 07/03/2021 1206   CO2 24 07/03/2021 1206   BUN 25 (H) 07/03/2021 1206   BUN 25 12/19/2019 1132   CREATININE 1.35 (H) 07/03/2021 1206   GLU 92 02/04/2016 0000      Component Value Date/Time   CALCIUM 8.9 07/03/2021 1206   ALKPHOS 93 07/03/2021 1206   AST 12 (L) 07/03/2021 1206   ALT 13 07/03/2021 1206   BILITOT 0.7 07/03/2021 1206       IMPRESSION: 1. No evidence of metastatic disease in the abdomen or pelvis. 2. Aortic Atherosclerosis (ICD10-I70.0).       Impression and Plan:   75 year old woman with   1.     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  She was found to have T2N0 urothelial carcinoma at that time.    CT scan obtained on July 03, 2021 was personally reviewed and showed no evidence of malignancy.  Risks and benefits of continued active surveillance versus the role for immunotherapy were discussed.  At this time I recommended continued active surveillance and will repeat imaging studies in 3 months.  She will continue to receive surveillance cystoscopy as well.      2.   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  The natural course of this disease was reviewed at this time and treatment choices were discussed.  I recommended continued active surveillance and we will update her staging scans in 3 months.   3.  Renal function surveillance: her kidney function remains still stable and slightly improved at this time.  We will continue to monitor.   4.  Port-A-Cath management: Every 6 to 8 weeks will  be flushed.    5.  Follow-up: In 3 months for repeat evaluation and imaging studies.  30  minutes were spent on this encounter.  The time was dedicated to reviewing laboratory data, disease status update and future plan of care discussion.  Zola Button, MD 07/07/2021 2:02 PM

## 2021-07-22 ENCOUNTER — Encounter: Payer: Self-pay | Admitting: Internal Medicine

## 2021-07-23 DIAGNOSIS — N3941 Urge incontinence: Secondary | ICD-10-CM | POA: Diagnosis not present

## 2021-07-23 DIAGNOSIS — C651 Malignant neoplasm of right renal pelvis: Secondary | ICD-10-CM | POA: Diagnosis not present

## 2021-07-27 DIAGNOSIS — R69 Illness, unspecified: Secondary | ICD-10-CM | POA: Diagnosis not present

## 2021-08-06 ENCOUNTER — Other Ambulatory Visit: Payer: Self-pay | Admitting: Urology

## 2021-08-18 ENCOUNTER — Inpatient Hospital Stay: Payer: Medicare HMO | Attending: Oncology

## 2021-08-18 ENCOUNTER — Inpatient Hospital Stay: Payer: Medicare HMO

## 2021-08-18 ENCOUNTER — Other Ambulatory Visit: Payer: Self-pay

## 2021-08-18 DIAGNOSIS — Z8553 Personal history of malignant neoplasm of renal pelvis: Secondary | ICD-10-CM | POA: Diagnosis not present

## 2021-08-18 DIAGNOSIS — C3411 Malignant neoplasm of upper lobe, right bronchus or lung: Secondary | ICD-10-CM | POA: Diagnosis not present

## 2021-08-18 DIAGNOSIS — Z452 Encounter for adjustment and management of vascular access device: Secondary | ICD-10-CM | POA: Insufficient documentation

## 2021-08-18 DIAGNOSIS — Z95828 Presence of other vascular implants and grafts: Secondary | ICD-10-CM

## 2021-08-18 MED ORDER — SODIUM CHLORIDE 0.9% FLUSH
10.0000 mL | Freq: Once | INTRAVENOUS | Status: AC
  Administered 2021-08-18: 13:00:00 10 mL

## 2021-08-18 MED ORDER — HEPARIN SOD (PORK) LOCK FLUSH 100 UNIT/ML IV SOLN
500.0000 [IU] | Freq: Once | INTRAVENOUS | Status: AC
  Administered 2021-08-18: 13:00:00 500 [IU]

## 2021-08-19 ENCOUNTER — Telehealth: Payer: Self-pay | Admitting: Oncology

## 2021-08-19 NOTE — Telephone Encounter (Signed)
Called patient regarding upcoming February appointments, voicemail was left and patient seems to be My chart active.

## 2021-08-26 IMAGING — PT NM PET TUM IMG RESTAG (PS) SKULL BASE T - THIGH
7 series · 25 of 25 positions shown · non-contrast
Comparison: PET-CT 10 0060, super D chest 06/04/2020

CLINICAL DATA: Subsequent treatment strategy for pulmonary nodule.
History of urothelial epithelial carcinoma

EXAM:
NUCLEAR MEDICINE PET SKULL BASE TO THIGH
TECHNIQUE: 12.7 mCi F-18 FDG was injected intravenously. Full-ring PET imaging
was performed from the skull base to thigh after the radiotracer. CT
data was obtained and used for attenuation correction and anatomic
localization.
Fasting blood glucose: 120 mg/dl

[Series 3: pet sk_thigh ac · axial · 5.0mm · 4.07mm/px · z∈[-1100,-176]mm · 6 of 232 slices shown]
[im 1/232]
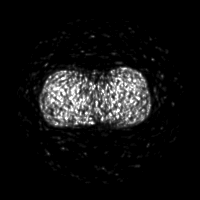
[im 47/232]
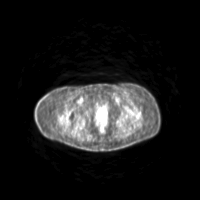
[im 93/232]
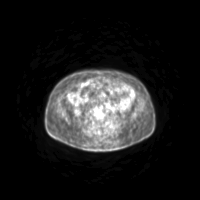
[im 139/232]
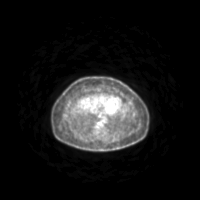
[im 185/232]
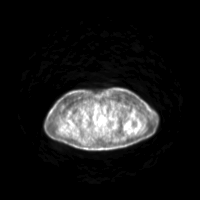
[im 232/232]
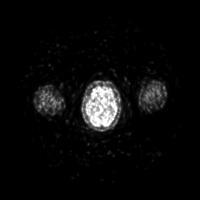

[Series 4: ct sk_thigh 5.0 bf37 · axial · 5.0mm · 0.98mm/px · z∈[-1100,-176]mm · 5 of 232 slices shown]
[im 1/232]
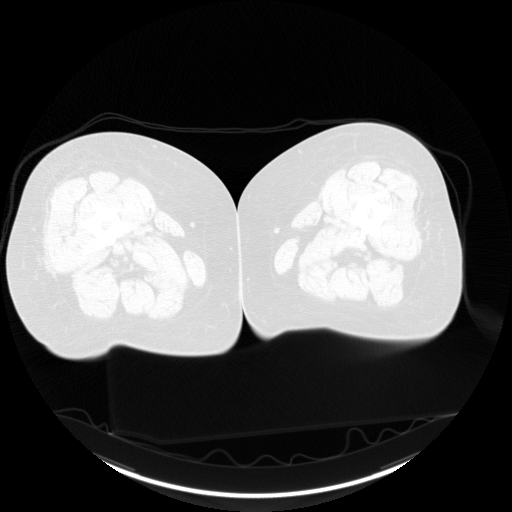
[im 58/232  brain]
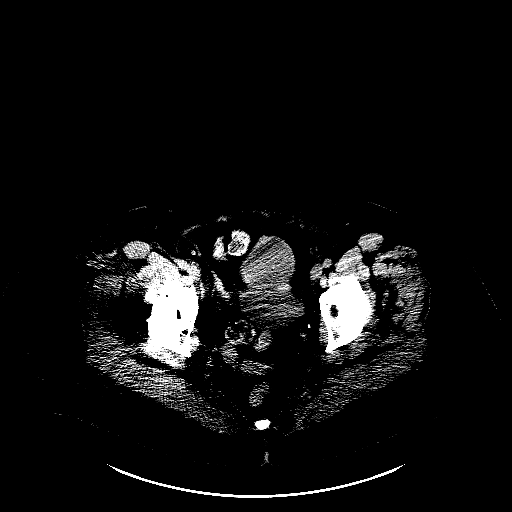
[im 116/232  brain]
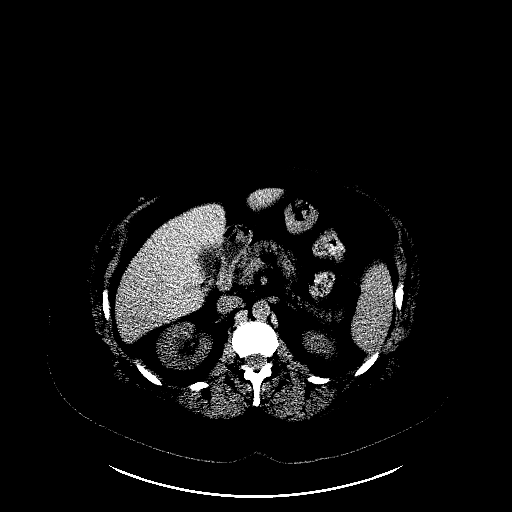
[im 174/232]
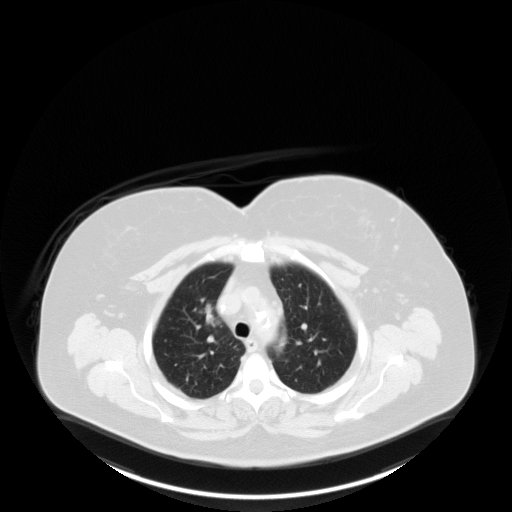
[im 232/232  brain]
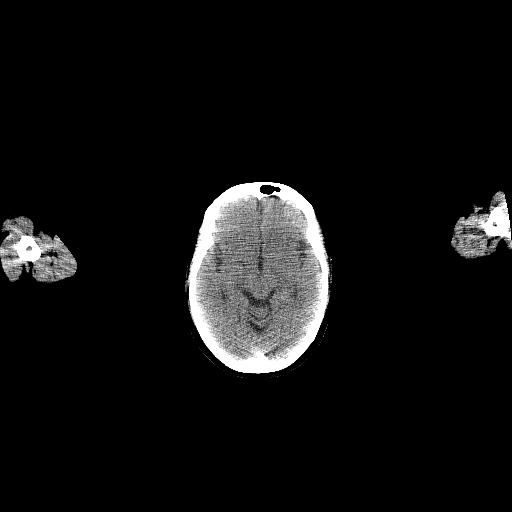

[Series 5: pet sk_thigh nac · axial · 5.0mm · 4.07mm/px · z∈[-1100,-176]mm · 5 of 232 slices shown]
[im 1/232]
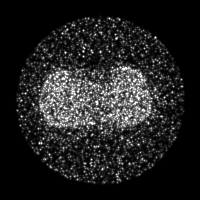
[im 58/232]
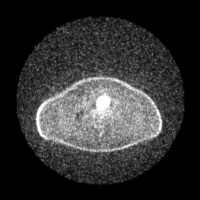
[im 116/232]
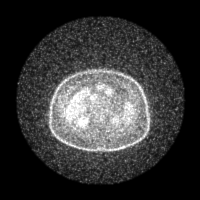
[im 174/232]
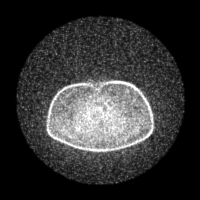
[im 232/232]
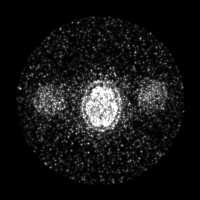

[Series 8: ct sk_thigh 5.0 br59 lung_bone · axial · 5.0mm · 0.68mm/px · z∈[-604,-332]mm · 2 of 69 slices shown]
[im 1/69  brain]
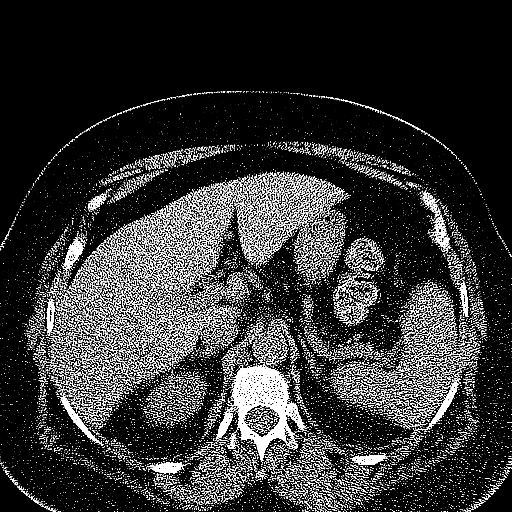
[im 69/69  brain]
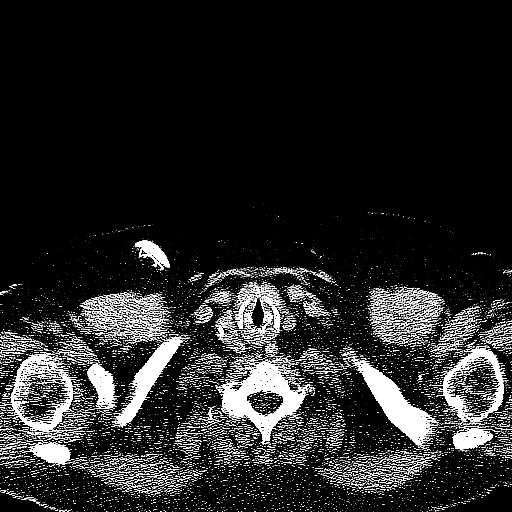

[Series 603: fused cor · 1 of 48 slices shown]
[im 1/48]
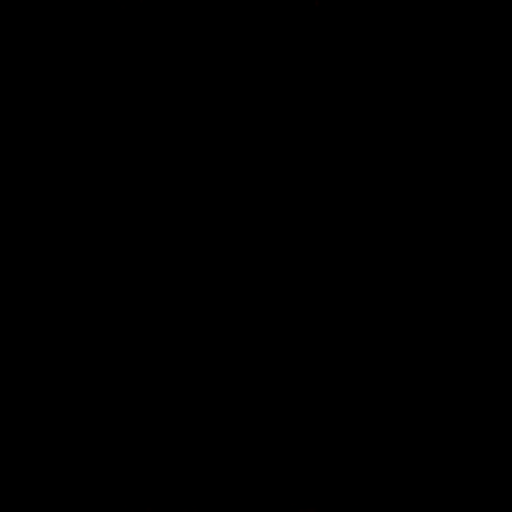

[Series 604: <mip collection> · coronal · 1.92mm/px · 1 of 32 slices shown]
[im 1/32]
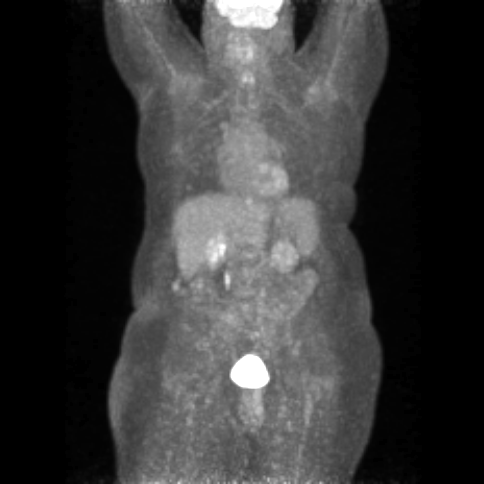

[Series 605: range-ct sk_thigh 5.0 bf37-tra-<alpha range> · 5 of 227 slices shown]
[im 1/227]
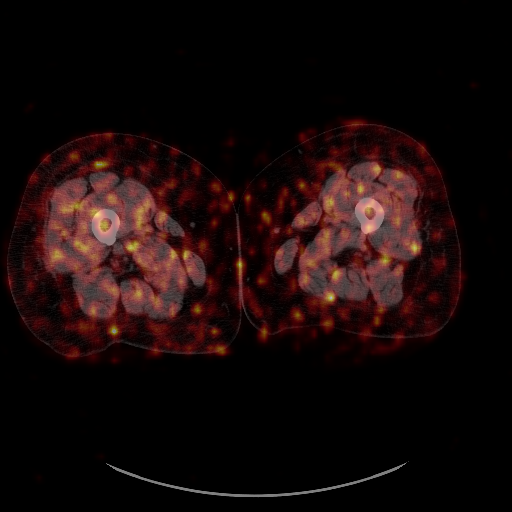
[im 57/227]
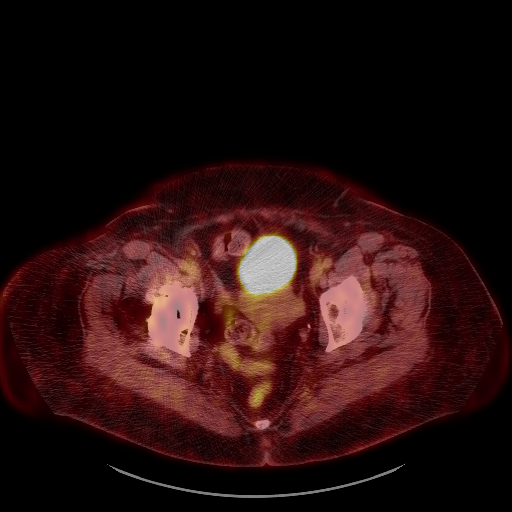
[im 114/227]
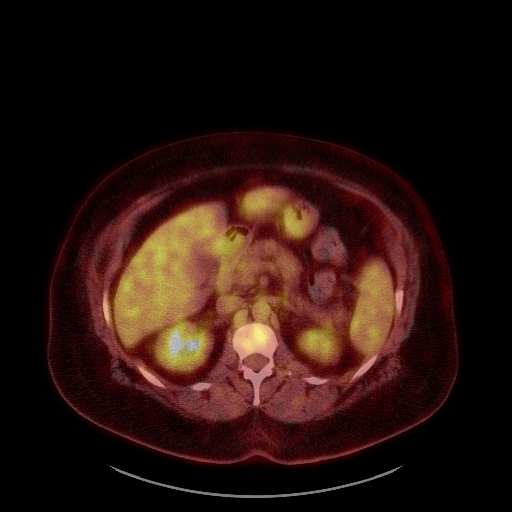
[im 170/227]
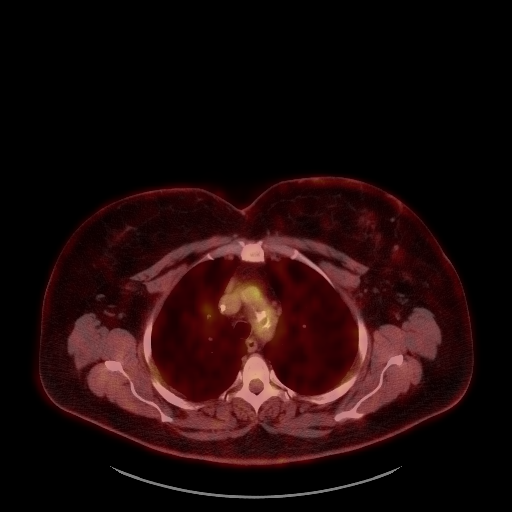
[im 227/227]
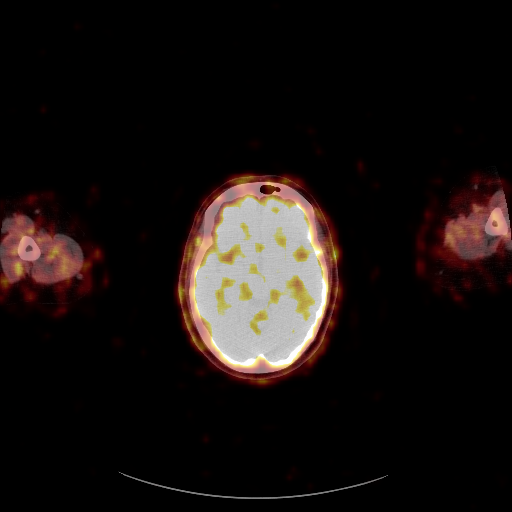

[25 of 25 positions shown; findings below may reference images not displayed]

FINDINGS: Mediastinal blood pool activity: SUV max

Liver activity: SUV max NA

NECK: No hypermetabolic lymph nodes in the neck.

Incidental CT findings: none

CHEST: RIGHT upper lobe pulmonary nodule of concern is decreased in
central density compared to prior. Previously the soft tissues
central component measures 16 x 12 mm on PET CT 06/02/2020. Nowthe a
solid component is less well-defined centrally measuring
approximately 10 mm x 11 mm. (Image 60/4).

Additionally, metabolic activity of this RIGHT upper lobe nodule is
significantly decreased with SUV max equal not 2.9 decreased from
SUV max equal 11.1.

Resolution of hypermetabolic paratracheal lymph node. No enlarged
mediastinal lymph nodes are present.

No new pulmonary nodularity.

Incidental CT findings: none

ABDOMEN/PELVIS: No evidence of malignancy within the pelvis. No
hypermetabolic pelvic lymph nodes.

No evidence of liver metastasis or periaortic adenopathy.

Incidental CT findings: none

SKELETON: No aggressive osseous lesion.

Incidental CT findings: none
IMPRESSION: 1. Interval decrease in volume and metabolic activity of the RIGHT
upper lobe pulmonary nodule.
2. No residual hypermetabolic mediastinal lymph nodes.
3. No evidence of malignancy in the abdomen pelvis.

## 2021-08-28 ENCOUNTER — Other Ambulatory Visit: Payer: Self-pay | Admitting: Internal Medicine

## 2021-09-02 DIAGNOSIS — C651 Malignant neoplasm of right renal pelvis: Secondary | ICD-10-CM | POA: Diagnosis not present

## 2021-09-02 DIAGNOSIS — R8271 Bacteriuria: Secondary | ICD-10-CM | POA: Diagnosis not present

## 2021-09-11 ENCOUNTER — Encounter (HOSPITAL_BASED_OUTPATIENT_CLINIC_OR_DEPARTMENT_OTHER): Payer: Self-pay | Admitting: Urology

## 2021-09-11 ENCOUNTER — Other Ambulatory Visit: Payer: Self-pay

## 2021-09-11 NOTE — Progress Notes (Signed)
Spoke w/ via phone for pre-op interview---pt Lab needs dos----  [employer] results------ current ekg in epic/ chart COVID test -----patient states asymptomatic no test needed Arrive at ------- 0530 on 09-16-2021 NPO after MN NO Solid Food.  Clear liquids from MN until--- 0430 Med rec completed Medications to take morning of surgery ----- none Diabetic medication ----- n/a Patient instructed no nail polish to be worn day of surgery Patient instructed to bring photo id and insurance card day of surgery Patient aware to have Driver (ride ) / caregiver    for 24 hours after surgery  Patient Special Instructions -----asked to bring rescue inhaler dos Pre-Op special Istructions ----- n/a Patient verbalized understanding of instructions that were given at this phone interview. Patient denies shortness of breath, chest pain, fever, cough at this phone interview.    Anesthesia Review: HTN; dx RUL lung cancer 02/ 2022 completed SBRT 04/ 2022; dx right renal pelvis cancer 08/ 2021 completed chemo 12/ 2021; CKD 3; Pre-diabetes;  Pt stated only has sob with flight of stairs but recovers quickly and no sob with walking/ household chores.  PCP: Dr A. Jerilee Hoh Oncologist:  Dr Alen Blew (lov 07-07-2021 epic) Chest x-ray : CT 07-03-2021 epic EKG : 04-22-2021 epic Echo : no Stress test: no Sleep Study/ CPAP : no Blood Thinner/ Instructions /Last Dose: no ASA / Instructions/ Last Dose :  ASA 81mg /  pt stated was given instructions to stop priior to surgery by dr winter office, last dose 09-09-2021

## 2021-09-15 NOTE — Anesthesia Preprocedure Evaluation (Addendum)
Anesthesia Evaluation  Patient identified by MRN, date of birth, ID band Patient awake    Reviewed: Allergy & Precautions, NPO status , Patient's Chart, lab work & pertinent test results  History of Anesthesia Complications (+) PONV  Airway Mallampati: II  TM Distance: >3 FB Neck ROM: Full    Dental  (+) Missing, Caps, Dental Advisory Given   Pulmonary COPD,  COPD inhaler, former smoker,  RUL adenocarcinoma: XRT   breath sounds clear to auscultation       Cardiovascular hypertension, Pt. on medications  Rhythm:Regular Rate:Normal     Neuro/Psych negative neurological ROS     GI/Hepatic Neg liver ROS, GERD  Controlled,nauseated   Endo/Other  Hypothyroidism Morbid obesity  Renal/GU Renal InsufficiencyRenal diseaseRenal carcinoma: chemo     Musculoskeletal  (+) Arthritis ,   Abdominal (+) + obese,   Peds  Hematology negative hematology ROS (+)   Anesthesia Other Findings   Reproductive/Obstetrics                            Anesthesia Physical Anesthesia Plan  ASA: 3  Anesthesia Plan: General   Post-op Pain Management: Tylenol PO (pre-op) and Minimal or no pain anticipated   Induction: Intravenous  PONV Risk Score and Plan: 3  Airway Management Planned: Oral ETT  Additional Equipment: None  Intra-op Plan:   Post-operative Plan: Extubation in OR  Informed Consent: I have reviewed the patients History and Physical, chart, labs and discussed the procedure including the risks, benefits and alternatives for the proposed anesthesia with the patient or authorized representative who has indicated his/her understanding and acceptance.     Dental advisory given  Plan Discussed with: CRNA and Surgeon  Anesthesia Plan Comments:        Anesthesia Quick Evaluation

## 2021-09-16 ENCOUNTER — Ambulatory Visit (HOSPITAL_BASED_OUTPATIENT_CLINIC_OR_DEPARTMENT_OTHER): Payer: Medicare HMO | Admitting: Anesthesiology

## 2021-09-16 ENCOUNTER — Ambulatory Visit (HOSPITAL_BASED_OUTPATIENT_CLINIC_OR_DEPARTMENT_OTHER)
Admission: RE | Admit: 2021-09-16 | Discharge: 2021-09-16 | Disposition: A | Discharge status: Home / Self Care | Payer: Medicare HMO | Attending: Urology | Admitting: Urology

## 2021-09-16 ENCOUNTER — Other Ambulatory Visit: Payer: Self-pay

## 2021-09-16 ENCOUNTER — Encounter (HOSPITAL_BASED_OUTPATIENT_CLINIC_OR_DEPARTMENT_OTHER)
Admission: RE | Disposition: A | Discharge status: Home / Self Care | Payer: Self-pay | Source: Home / Self Care | Attending: Urology

## 2021-09-16 ENCOUNTER — Encounter (HOSPITAL_BASED_OUTPATIENT_CLINIC_OR_DEPARTMENT_OTHER): Payer: Self-pay | Admitting: Urology

## 2021-09-16 DIAGNOSIS — F17210 Nicotine dependence, cigarettes, uncomplicated: Secondary | ICD-10-CM | POA: Diagnosis not present

## 2021-09-16 DIAGNOSIS — C3490 Malignant neoplasm of unspecified part of unspecified bronchus or lung: Secondary | ICD-10-CM | POA: Insufficient documentation

## 2021-09-16 DIAGNOSIS — N329 Bladder disorder, unspecified: Secondary | ICD-10-CM | POA: Insufficient documentation

## 2021-09-16 DIAGNOSIS — E039 Hypothyroidism, unspecified: Secondary | ICD-10-CM | POA: Diagnosis not present

## 2021-09-16 DIAGNOSIS — Z8553 Personal history of malignant neoplasm of renal pelvis: Secondary | ICD-10-CM | POA: Diagnosis not present

## 2021-09-16 DIAGNOSIS — N3289 Other specified disorders of bladder: Secondary | ICD-10-CM | POA: Diagnosis not present

## 2021-09-16 DIAGNOSIS — D303 Benign neoplasm of bladder: Secondary | ICD-10-CM | POA: Diagnosis not present

## 2021-09-16 DIAGNOSIS — Z6841 Body Mass Index (BMI) 40.0 and over, adult: Secondary | ICD-10-CM | POA: Diagnosis not present

## 2021-09-16 DIAGNOSIS — Z9221 Personal history of antineoplastic chemotherapy: Secondary | ICD-10-CM | POA: Diagnosis not present

## 2021-09-16 DIAGNOSIS — I10 Essential (primary) hypertension: Secondary | ICD-10-CM | POA: Diagnosis not present

## 2021-09-16 DIAGNOSIS — Z923 Personal history of irradiation: Secondary | ICD-10-CM | POA: Diagnosis not present

## 2021-09-16 DIAGNOSIS — C651 Malignant neoplasm of right renal pelvis: Secondary | ICD-10-CM | POA: Diagnosis not present

## 2021-09-16 HISTORY — PX: CYSTOSCOPY WITH BIOPSY: SHX5122

## 2021-09-16 LAB — POCT I-STAT, CHEM 8
BUN: 27 mg/dL — ABNORMAL HIGH (ref 8–23)
Calcium, Ion: 1.14 mmol/L — ABNORMAL LOW (ref 1.15–1.40)
Chloride: 101 mmol/L (ref 98–111)
Creatinine, Ser: 1.8 mg/dL — ABNORMAL HIGH (ref 0.44–1.00)
Glucose, Bld: 137 mg/dL — ABNORMAL HIGH (ref 70–99)
HCT: 40 % (ref 36.0–46.0)
Hemoglobin: 13.6 g/dL (ref 12.0–15.0)
Potassium: 3.9 mmol/L (ref 3.5–5.1)
Sodium: 140 mmol/L (ref 135–145)
TCO2: 28 mmol/L (ref 22–32)

## 2021-09-16 SURGERY — CYSTOSCOPY, WITH BIOPSY
Anesthesia: General | Site: Ureter | Laterality: Right

## 2021-09-16 MED ORDER — ACETAMINOPHEN 500 MG PO TABS
ORAL_TABLET | ORAL | Status: AC
  Filled 2021-09-16: qty 2

## 2021-09-16 MED ORDER — ACETAMINOPHEN 500 MG PO TABS
1000.0000 mg | ORAL_TABLET | Freq: Once | ORAL | Status: AC
  Administered 2021-09-16: 06:00:00 1000 mg via ORAL

## 2021-09-16 MED ORDER — CIPROFLOXACIN IN D5W 400 MG/200ML IV SOLN
400.0000 mg | INTRAVENOUS | Status: AC
  Administered 2021-09-16: 08:00:00 400 mg via INTRAVENOUS

## 2021-09-16 MED ORDER — PROMETHAZINE HCL 25 MG/ML IJ SOLN: 6.2500 mg | INTRAMUSCULAR | Status: DC | PRN

## 2021-09-16 MED ORDER — ONDANSETRON HCL 4 MG/2ML IJ SOLN
INTRAMUSCULAR | Status: DC | PRN
  Administered 2021-09-16: 4 mg via INTRAVENOUS

## 2021-09-16 MED ORDER — PHENYLEPHRINE HCL (PRESSORS) 10 MG/ML IV SOLN
INTRAVENOUS | Status: DC | PRN
Start: 2021-09-16 — End: 2021-09-16
  Administered 2021-09-16: 120 ug via INTRAVENOUS
  Administered 2021-09-16: 80 ug via INTRAVENOUS

## 2021-09-16 MED ORDER — PROPOFOL 10 MG/ML IV BOLUS
INTRAVENOUS | Status: DC | PRN
  Administered 2021-09-16: 50 mg via INTRAVENOUS
  Administered 2021-09-16: 150 mg via INTRAVENOUS

## 2021-09-16 MED ORDER — MIDAZOLAM HCL 2 MG/2ML IJ SOLN: 0.5000 mg | Freq: Once | INTRAMUSCULAR | Status: DC | PRN

## 2021-09-16 MED ORDER — EPHEDRINE SULFATE (PRESSORS) 50 MG/ML IJ SOLN
INTRAMUSCULAR | Status: DC | PRN
  Administered 2021-09-16: 5 mg via INTRAVENOUS

## 2021-09-16 MED ORDER — OXYCODONE HCL 5 MG/5ML PO SOLN: 5.0000 mg | Freq: Once | ORAL | Status: DC | PRN

## 2021-09-16 MED ORDER — OXYBUTYNIN CHLORIDE 5 MG PO TABS: 5.0000 mg | ORAL_TABLET | Freq: Three times a day (TID) | ORAL | 1 refills | Status: DC | PRN

## 2021-09-16 MED ORDER — DEXAMETHASONE SODIUM PHOSPHATE 4 MG/ML IJ SOLN
INTRAMUSCULAR | Status: DC | PRN
  Administered 2021-09-16: 5 mg via INTRAVENOUS

## 2021-09-16 MED ORDER — MEPERIDINE HCL 25 MG/ML IJ SOLN: 6.2500 mg | INTRAMUSCULAR | Status: DC | PRN

## 2021-09-16 MED ORDER — SODIUM CHLORIDE 0.9 % IR SOLN
Status: DC | PRN
  Administered 2021-09-16: 3000 mL

## 2021-09-16 MED ORDER — FENTANYL CITRATE (PF) 100 MCG/2ML IJ SOLN: 25.0000 ug | INTRAMUSCULAR | Status: DC | PRN

## 2021-09-16 MED ORDER — SODIUM CHLORIDE 0.9 % IV SOLN: INTRAVENOUS | Status: DC

## 2021-09-16 MED ORDER — PROPOFOL 500 MG/50ML IV EMUL
INTRAVENOUS | Status: AC
  Filled 2021-09-16: qty 50

## 2021-09-16 MED ORDER — CIPROFLOXACIN IN D5W 400 MG/200ML IV SOLN
INTRAVENOUS | Status: AC
  Filled 2021-09-16: qty 200

## 2021-09-16 MED ORDER — OXYCODONE HCL 5 MG PO TABS: 5.0000 mg | ORAL_TABLET | Freq: Once | ORAL | Status: DC | PRN

## 2021-09-16 MED ORDER — FENTANYL CITRATE (PF) 100 MCG/2ML IJ SOLN
INTRAMUSCULAR | Status: AC
  Filled 2021-09-16: qty 2

## 2021-09-16 MED ORDER — LIDOCAINE HCL (CARDIAC) PF 100 MG/5ML IV SOSY
PREFILLED_SYRINGE | INTRAVENOUS | Status: DC | PRN
  Administered 2021-09-16: 40 mg via INTRAVENOUS

## 2021-09-16 MED ORDER — STERILE WATER FOR IRRIGATION IR SOLN
Status: DC | PRN
Start: 2021-09-16 — End: 2021-09-16
  Administered 2021-09-16: 3000 mL

## 2021-09-16 MED ORDER — FENTANYL CITRATE (PF) 100 MCG/2ML IJ SOLN
INTRAMUSCULAR | Status: DC | PRN
Start: 2021-09-16 — End: 2021-09-16
  Administered 2021-09-16 (×2): 50 ug via INTRAVENOUS

## 2021-09-16 MED ORDER — SODIUM CHLORIDE (PF) 0.9 % IJ SOLN: INTRAMUSCULAR | Status: DC | PRN

## 2021-09-16 MED ORDER — SUCCINYLCHOLINE CHLORIDE 200 MG/10ML IV SOSY
PREFILLED_SYRINGE | INTRAVENOUS | Status: DC | PRN
  Administered 2021-09-16: 140 mg via INTRAVENOUS

## 2021-09-16 SURGICAL SUPPLY — 17 items
BAG DRAIN URO-CYSTO SKYTR STRL (DRAIN) ×2 IMPLANT
BAG DRN UROCATH (DRAIN) ×1
CATH ROBINSON RED A/P 14FR (CATHETERS) IMPLANT
CATH URET 5FR 28IN OPEN ENDED (CATHETERS) ×1 IMPLANT
CLOTH BEACON ORANGE TIMEOUT ST (SAFETY) ×2 IMPLANT
ELECT REM PT RETURN 9FT ADLT (ELECTROSURGICAL) ×2
ELECTRODE REM PT RTRN 9FT ADLT (ELECTROSURGICAL) ×1 IMPLANT
GLOVE SURG ENC MOIS LTX SZ7.5 (GLOVE) ×2 IMPLANT
GOWN STRL REUS W/TWL LRG LVL3 (GOWN DISPOSABLE) ×2 IMPLANT
KIT TURNOVER CYSTO (KITS) ×2 IMPLANT
MANIFOLD NEPTUNE II (INSTRUMENTS) ×2 IMPLANT
NDL SAFETY ECLIPSE 18X1.5 (NEEDLE) IMPLANT
NEEDLE HYPO 18GX1.5 SHARP (NEEDLE)
PACK CYSTO (CUSTOM PROCEDURE TRAY) ×2 IMPLANT
SYR 20ML LL LF (SYRINGE) IMPLANT
TUBE CONNECTING 12X1/4 (SUCTIONS) ×2 IMPLANT
WATER STERILE IRR 3000ML UROMA (IV SOLUTION) ×2 IMPLANT

## 2021-09-16 NOTE — Discharge Instructions (Signed)

## 2021-09-16 NOTE — H&P (Signed)
PRE-OP H&P  Office Visit Report     09/02/2021   --------------------------------------------------------------------------------   Maria Marquez  MRN: [id number]  DOB: [birth year], 76 year old Female  SSN:    PRIMARY CARE:  Maria Marquez  REFERRING:  Maria Marquez  PROVIDER:  Bjorn Loser, M.D.  TREATING:  Maria Marquez  LOCATION:  [facility] [phone]     --------------------------------------------------------------------------------   CC/HPI: CC: Right ureteral lesion   HPI: Maria Marquez is a 75 year old female, referred by Dr. Matilde Marquez after she was found to have a 2.7 cm solid and enhancing lesion in the proximal aspects of the right ureter on CT during a work-up for gross hematuria. She was also noted to have a 13 mm left renal lesion, likely representing a proteinaceous/hyperdense cyst. She is s/p diagnostic right ureteroscopy on 04/18/20 and was found to have multiple large papillary tumors throughout the right renal pelvis/UPJ. 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 She is s/p gem/cis chemotherapy and radiation to the thorax.   11/24/20: The patient is here today for a routine follow-up. She has completed chemotherapy and is near completion of radiation therapy for her primary lung cancer. PET CT from January revealed no intra-abdominal lesions and no GU abnormalities. She is urinating without difficulty and denies interval UTIs, dysuria or hematuria. Of note, since starting chemotherapy, her renal function has progressively declined with her last eGFR being 38 (serum creatinine- 1.45).   01/16/21: The patient is here today for a routine follow-up and stent removal. She is s/p right URS with ablation of a 1-2 cm papillary UCC on 12/31/20. Pathology showed low grade, detached UCC. She has done well post-op with her only  complaint being urinary urgency and frequency q 2 hours, which were present months prior to surgery. She denies interval UTIs, fever/chills or significant flank pain.   07/23/21: The patient is here today for a routine follow-up and to plan her next surveillance ureteroscopy. Doing well and reports no new major health issues. Urge incontinence has worsened--wearing 3-4 ppd. Frequency q 2-2.5 hours. Minimal SUI sxs. Denies interval UTIs, dysuria or hematuria.   09/02/2021: 76 year old female who presents today for preoperative appointment. She denies any changes to her voiding habits, shortness of breath, chest pain, changes to her medications. She has been having frequent nosebleeds and does endorse that they can be quite severe at times. She has not seen an ENT for this. She reports her blood pressures have been at her normal.. She has no questions today regarding her upcoming ureteroscopy. She denies interval UTIs, dysuria, hematuria.     ALLERGIES: Cephalexin IVP Dye - Trouble Breathing, Other Reaction, Tightness in throat Shellfish    MEDICATIONS: Aspirin 81 mg tablet,chewable  Lipitor  Myrbetriq 50 mg tablet, extended release 24 hr 1 tablet PO Daily  Simvastatin 10 mg tablet  Estradiol-Norethindrone Acetat 1 mg-0.5 mg tablet  Ezetimibe 10 mg tablet  Levothyroxine 137 mcg capsule  Losartan-Hydrochlorothiazide 50 mg-12.5 mg tablet  Ondansetron Odt 4 mg tablet,disintegrating 1 tablet PO Q 4 H PRN     GU PSH: Cysto Remove Stent FB Sim - 01/16/2021 Cysto Uretero Biopsy Fulgura, Right - 04/18/2020 Cysto Uretero W/excise Tumor, Right - 12/31/2020 Cystoscopy - 03/21/2020 Cystoscopy Ureteroscopy - 04/22/2021 Locm 300-399Mg/Ml Iodine,1Ml - 03/19/2020     NON-GU PSH: Hip Replacement, Right - 2017 Thyroid Surgery, thyroid lobe removal -  1979     GU PMH: Renal pelvis cancer, right - 07/23/2021, - 04/13/2021, - 01/16/2021, - 05/14/2020, - 04/25/2020 Urge incontinence - 07/23/2021 Microscopic hematuria  - 04/13/2021 Nocturia (Stable) - 01/16/2021, - 03/18/2020 Urinary Urgency - 01/16/2021 Chronic kidney disease stage 3 (GFR 30-60) - 11/24/2020 Right renal neoplasm - 11/24/2020, - 04/03/2020 Gross hematuria - 04/25/2020, (Stable), - 04/03/2020, - 03/19/2020 Hemorrhagic cystitis - 03/18/2020 Urinary Frequency - 03/18/2020    NON-GU PMH: Arthritis GERD Hypercholesterolemia Hypertension Hypothyroidism    FAMILY HISTORY: 1 Daughter - Daughter 1 son - Son Breast Cancer - Mother Diabetes - Father father deceased - Father mother deceased - Mother    Notes: Mother deceased at age 11 d/t breast cancer  Father deceased at age 42 d/t diabetes   SOCIAL HISTORY: Marital Status: Widowed Current Smoking Status: Patient smokes. Has smoked since 02/20/1990. Smokes 1/2 pack per day.   Tobacco Use Assessment Completed: Used Tobacco in last 30 days? Does not drink anymore.  Drinks 2 caffeinated drinks per day.    REVIEW OF SYSTEMS:    GU Review Female:   Patient denies frequent urination, hard to postpone urination, burning /pain with urination, get up at night to urinate, leakage of urine, stream starts and stops, trouble starting your stream, have to strain to urinate, and being pregnant.  Gastrointestinal (Upper):   Patient denies nausea, vomiting, and indigestion/ heartburn.  Gastrointestinal (Lower):   Patient denies diarrhea and constipation.  Constitutional:   Patient denies fever, night sweats, weight loss, and fatigue.  Skin:   Patient denies skin rash/ lesion and itching.  Eyes:   Patient denies blurred vision and double vision.  Ears/ Nose/ Throat:   Patient denies sore throat and sinus problems.  Hematologic/Lymphatic:   Patient denies swollen glands and easy bruising.  Cardiovascular:   Patient denies leg swelling and chest pains.  Respiratory:   Patient denies shortness of breath and cough.  Endocrine:   Patient denies excessive thirst.  Musculoskeletal:   Patient denies back pain and joint  pain.  Neurological:   Patient denies headaches and dizziness.  Psychologic:   Patient denies depression and anxiety.   VITAL SIGNS:      09/02/2021 10:02 AM  Weight 260 lb / 117.93 kg  Height 66 in / 167.64 cm  BP 150/81 mmHg  Pulse 78 /min  BMI 42.0 kg/m   MULTI-SYSTEM PHYSICAL EXAMINATION:    Constitutional: Well-nourished. No physical deformities. Normally developed. Good grooming.  Neck: Neck symmetrical, not swollen. Normal tracheal position.  Respiratory: Normal breath sounds. No labored breathing, no use of accessory muscles.   Cardiovascular: Normal temperature, normal extremity pulses, no swelling, no varicosities.  Skin: No paleness, no jaundice, no cyanosis. No lesion, no ulcer, no rash.  Neurologic / Psychiatric: Oriented to time, oriented to place, oriented to person. No depression, no anxiety, no agitation.  Gastrointestinal: No mass, no tenderness, no rigidity, non obese abdomen.  Ears, Nose, Mouth, and Throat: Left ear no scars, no lesions, no masses. Right ear no scars, no lesions, no masses. Nose no scars, no lesions, no masses. Normal hearing. Normal lips.  Musculoskeletal: Normal gait and station of head and neck.     Complexity of Data:  Source Of History:  Patient  Records Review:   Previous Doctor Records, Previous Patient Records  Urine Test Review:   Urinalysis   09/02/21  Urinalysis  Urine Appearance Cloudy   Urine Color Yellow   Urine Glucose Neg mg/dL  Urine Bilirubin Neg mg/dL  Urine Ketones Neg mg/dL  Urine Specific Gravity 1.025   Urine Blood 2+ ery/uL  Urine pH <=5.0   Urine Protein Neg mg/dL  Urine Urobilinogen 0.2 mg/dL  Urine Nitrites Neg   Urine Leukocyte Esterase 1+ leu/uL  Urine WBC/hpf 0 - 5/hpf   Urine RBC/hpf 0 - 2/hpf   Urine Epithelial Cells 6 - 10/hpf   Urine Bacteria Mod (26-50/hpf)   Urine Mucous Not Present   Urine Yeast NS (Not Seen)   Urine Trichomonas Not Present   Urine Cystals NS (Not Seen)   Urine Casts NS (Not  Seen)   Urine Sperm Not Present    PROCEDURES:          Urinalysis w/Scope - 81001 Dipstick Dipstick Cont'd Micro  Color: Yellow Bilirubin: Neg WBC/hpf: 0 - 5/hpf  Appearance: Cloudy Ketones: Neg RBC/hpf: 0 - 2/hpf  Specific Gravity: 1.025 Blood: 2+ Bacteria: Mod (26-50/hpf)  pH: <=5.0 Protein: Neg Cystals: NS (Not Seen)  Glucose: Neg Urobilinogen: 0.2 Casts: NS (Not Seen)    Nitrites: Neg Trichomonas: Not Present    Leukocyte Esterase: 1+ Mucous: Not Present      Epithelial Cells: 6 - 10/hpf      Yeast: NS (Not Seen)      Sperm: Not Present    Notes:  QNS for spun micro     ASSESSMENT:      ICD-10 Details  1 GU:   Renal pelvis cancer, right - C65.1 Chronic, Stable   PLAN:           Orders Labs CULTURE, URINE          Document Letter(s):  Created for Patient: Clinical Summary         Notes:   The risks, benefits and alternatives of cystoscopy with right ureteroscopy, possible biopsy, possible right ureteral stent placement was discussed with the patient.  She voices understanding and wishes to proceed.

## 2021-09-16 NOTE — Anesthesia Postprocedure Evaluation (Signed)
Anesthesia Post Note  Patient: Maria Marquez  Procedure(s) Performed: CYSTOSCOPY / URETEROSCOPY WITH BIOPSY/bugbee fulgeration/right retrograde (Right: Ureter)     Patient location during evaluation: PACU Anesthesia Type: General Level of consciousness: awake and alert, patient cooperative and oriented Pain management: pain level controlled Vital Signs Assessment: post-procedure vital signs reviewed and stable Respiratory status: spontaneous breathing, nonlabored ventilation and respiratory function stable Cardiovascular status: blood pressure returned to baseline and stable Postop Assessment: no apparent nausea or vomiting, adequate PO intake and able to ambulate Anesthetic complications: no   No notable events documented.  Last Vitals:  Vitals:   09/16/21 0900 09/16/21 0915  BP: (!) 165/70 (!) 161/58  Pulse: 76 77  Resp: 17 16  Temp:    SpO2: 96% 92%    Last Pain:  Vitals:   09/16/21 0915  TempSrc:   PainSc: 0-No pain                 Twylia Oka,E. Keisha Amer

## 2021-09-16 NOTE — Anesthesia Procedure Notes (Signed)
Procedure Name: Intubation Date/Time: 09/16/2021 8:15 AM Performed by: Georgeanne Nim, CRNA Pre-anesthesia Checklist: Patient identified, Emergency Drugs available, Suction available, Patient being monitored and Timeout performed Patient Re-evaluated:Patient Re-evaluated prior to induction Preoxygenation: Pre-oxygenation with 100% oxygen Induction Type: IV induction Ventilation: Mask ventilation without difficulty and Oral airway inserted - appropriate to patient size Laryngoscope Size: Mac, 4, Glidescope and 3 Grade View: Grade III Tube type: Oral Tube size: 7.0 mm Number of attempts: 2 Airway Equipment and Method: Stylet Placement Confirmation: ETT inserted through vocal cords under direct vision, positive ETCO2, CO2 detector and breath sounds checked- equal and bilateral Secured at: 21 cm Tube secured with: Tape Dental Injury: Teeth and Oropharynx as per pre-operative assessment  Comments: Decision made to intubate due to nausea preop;Unable to view cords with MAC 4;Lopro 3 with good view of glottic opening although cords not open fully and small glottic opening noted;ETT inserted easily with glidescope;ventilation maintained between attempts easily with 65m airway intact

## 2021-09-16 NOTE — Transfer of Care (Signed)
Immediate Anesthesia Transfer of Care Note  Patient: Maria Marquez  Procedure(s) Performed: CYSTOSCOPY / URETEROSCOPY WITH BIOPSY/bugbee fulgeration/right retrograde (Right: Ureter)  Patient Location: PACU  Anesthesia Type:General  Level of Consciousness: awake, alert , oriented and patient cooperative  Airway & Oxygen Therapy: Patient Spontanous Breathing and Patient connected to face mask oxygen  Post-op Assessment: Report given to RN and Post -op Vital signs reviewed and stable  Post vital signs: Reviewed and stable  Last Vitals:  Vitals Value Taken Time  BP    Temp    Pulse    Resp 12 09/16/21 0851  SpO2    Vitals shown include unvalidated device data.  Last Pain:  Vitals:   09/16/21 0617  TempSrc: Oral  PainSc: 0-No pain      Patients Stated Pain Goal: 6 (32/91/91 6606)  Complications: No notable events documented.

## 2021-09-16 NOTE — Op Note (Signed)
Operative Note  Preoperative diagnosis:  1.  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  5 mm posterior bladder mucosal lesion  Procedure(s): 1.  Cystoscopy with right ureteroscopy 2.  Bladder biopsy (5 mm) 3.  Right retrograde pyelogram with intraoperative interpretation of fluoroscopic imaging  Surgeon: Ellison Hughs, MD  Assistants:  None  Anesthesia:  General  Complications:  None  EBL: Less than 5 mL  Specimens: 1.  Right renal pelvis washings 2.  Posterior bladder wall biopsy  Drains/Catheters: 1.  None  Intraoperative findings:   Solitary right collecting system with no filling defects or dilation involving the right ureter or right renal pelvis seen on retrograde pyelogram No mucosal abnormalities were seen within the right renal pelvis or along the entire length of the right ureter Indeterminate 5 mm posterior bladder mucosal lesion  Indication:  Maria Marquez is a 76 y.o. female with a history of high-grade UCC of the right renal pelvis that is currently being actively surveyed after the lesion was eradicated following a course of chemotherapy and radiation for primary lung cancer.  She is here today for the above procedures.  She has been consented and voices understanding.  Description of procedure:  After informed consent was obtained, the patient was brought to the operating room and general LMA anesthesia was administered. The patient was then placed in the dorsolithotomy position and prepped and draped in the usual sterile fashion. A timeout was performed. A 23 French rigid cystoscope was then inserted into the urethral meatus and advanced into the bladder under direct vision. A complete bladder survey revealed an indeterminate 5 mm lesion involving the posterior bladder wall.  A 5 French ureteral catheter was then inserted into the right  ureteral orifice and a retrograde pyelogram was obtained, with the findings listed above.  A Glidewire was then used to intubate the lumen of the ureteral catheter and was advanced up to the right renal pelvis, under fluoroscopic guidance.  The catheter was then removed, leaving the wire in place.  A flexible ureteroscope was then advanced over the wire and up to the right renal pelvis.  A complete survey of the right renal pelvis and all associated calyces revealed no callosal lesions in the upper tracts.  There were no lesions seen within the ureteral mucosa.  The indeterminate lesion in the bladder was then biopsied using cold cup forceps.  The area that was biopsied was then sent for permanent section.  Extensive fulguration was then used to cauterize the area that was biopsied until hemostasis was achieved.  Patient's bladder was then drained.  She tolerated the procedure well and was transferred to the postanesthesia in stable condition.  Plan: Discharge home

## 2021-09-16 NOTE — Progress Notes (Signed)
Patient needed to urinate per protocol and orders for urology patients. Patient attempted three times to urinate and was unsuccessful. Patient started second bag of IV fluid and drank 279mL of ginger ale. Patient bladder scanned and results indicated 65mL. Dr. Lovena Neighbours notified of interventions and patients inability to urinate and said that he emptied her bladder at the end of the case and that she did not have to urinate before discharge. He also stated that if patient had not urinated by 3pm after discharge, she needed to call the office. Patient went to the bathroom and was able to urinate 196mL. Patient was given a total of 1500 mL of fluid. Patient discharged in stable condition, no pain, skin warm and dry, with daughter Linus Orn.

## 2021-09-17 ENCOUNTER — Telehealth: Payer: Self-pay | Admitting: *Deleted

## 2021-09-17 ENCOUNTER — Encounter (HOSPITAL_BASED_OUTPATIENT_CLINIC_OR_DEPARTMENT_OTHER): Payer: Self-pay | Admitting: Urology

## 2021-09-17 LAB — CYTOLOGY - NON PAP

## 2021-09-17 LAB — SURGICAL PATHOLOGY

## 2021-09-17 NOTE — Telephone Encounter (Signed)
RETURNED PATIENT'S PHONE CALL, SPOKE WITH PATIENT. ?

## 2021-09-22 ENCOUNTER — Ambulatory Visit (INDEPENDENT_AMBULATORY_CARE_PROVIDER_SITE_OTHER): Payer: Medicare HMO | Admitting: Internal Medicine

## 2021-09-22 VITALS — BP 120/64 | HR 84 | Temp 98.3°F | Wt 273.0 lb

## 2021-09-22 DIAGNOSIS — N1831 Chronic kidney disease, stage 3a: Secondary | ICD-10-CM

## 2021-09-22 DIAGNOSIS — R7303 Prediabetes: Secondary | ICD-10-CM | POA: Diagnosis not present

## 2021-09-22 DIAGNOSIS — C659 Malignant neoplasm of unspecified renal pelvis: Secondary | ICD-10-CM

## 2021-09-22 DIAGNOSIS — E890 Postprocedural hypothyroidism: Secondary | ICD-10-CM

## 2021-09-22 DIAGNOSIS — C3411 Malignant neoplasm of upper lobe, right bronchus or lung: Secondary | ICD-10-CM

## 2021-09-22 DIAGNOSIS — I10 Essential (primary) hypertension: Secondary | ICD-10-CM | POA: Diagnosis not present

## 2021-09-22 MED ORDER — ONDANSETRON HCL 4 MG PO TABS: 4.0000 mg | ORAL_TABLET | Freq: Three times a day (TID) | ORAL | 0 refills | Status: DC | PRN

## 2021-09-22 NOTE — Progress Notes (Signed)
Established Patient Office Visit     This visit occurred during the SARS-CoV-2 public health emergency.  Safety protocols were in place, including screening questions prior to the visit, additional usage of staff PPE, and extensive cleaning of exam room while observing appropriate contact time as indicated for disinfecting solutions.    CC/Reason for Visit: Follow-up chronic medical conditions  HPI: Maria Marquez is a 76 y.o. female who is coming in today for the above mentioned reasons. Past Medical History is significant for: Hypertension, hyperlipidemia, stage IIIa chronic kidney disease, high-grade urothelial cancer and lung cancer.  She is followed by Dr. Alen Blew.  She has no major concerns or complaints today.  She has noted that she has gained some weight, she will be starting an eating plan soon.  She is requesting a refill of Zofran.  She has noted some mild dyspnea on exertion, she is scheduled for a CT chest to follow-up on her lung cancer next month.   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[facility])    urologist-- dr winter/  oncologist-- dr Alen Blew;   dx 08/ 2021 ;  chemo 10/ 2021  to 12/ 2021   Chronic kidney disease, stage 3a ([place]) 12/31/2019   Dyspnea    12-26-2020  per pt no issue most of the time   Family history of breast cancer 05/04/2021   Family history of ovarian cancer 05/04/2021   GERD (gastroesophageal reflux disease)    per pt occasionally takes tums   History of basal cell carcinoma (BCC) excision    per pt in 1970s on nose   History of chemotherapy    06-13-2020  to 12/ 2021  for right renal pelvis carcinoma   History of colon polyps    History of radiation therapy    11-13-2020 to 11-26-2020---   Right Lung- SBRT- Dr. Gery Pray   HLD (hyperlipidemia)     Hypertension    followed by pcp   Hypothyroidism, postsurgical 1979   followed by pcp   Leukocytosis    Nocturia    OA (osteoarthritis)    PONV (postoperative nausea and vomiting)    Since chemo (always nauseous)   Pre-diabetes    Primary adenocarcinoma of upper lobe of right lung [facility])    oncologist--- shadad/  pulmonology-- dr byrum;  dx 02/ 2022;  s/p  SBRT 11-13-2020 to 11-26-2020   Urgency of urination    Wears partial dentures    lower    Past Surgical History:  Procedure Laterality Date   COLONOSCOPY  last one 2017   CYSTOSCOPY WITH BIOPSY Right 09/16/2021   Procedure: CYSTOSCOPY / URETEROSCOPY WITH BIOPSY/bugbee fulgeration/right retrograde;  Surgeon: Ceasar Mons, MD;  Location: [facility];  Service: Urology;  Laterality: Right;   CYSTOSCOPY WITH URETEROSCOPY Right 04/18/2020   Procedure: CYSTOSCOPY WITH URETEROSCOPY, BIOPSY;  Surgeon: Ceasar Mons, MD;  Location: [facility];  Service: Urology;  Laterality: Right;  ONLY NEEDS 45 MIN   CYSTOSCOPY WITH URETEROSCOPY AND STENT PLACEMENT N/A 12/31/2020   Procedure: CYSTOSCOPY WITH RIGHT URETEROSCOPY/ BILATERAL RETROGRADE PYELOGRAM/RIGHT URETERAL BIOPSY/ LASER ABLATION/RIGHT STENT PLACEMENT;  Surgeon: Ceasar Mons, MD;  Location[facility];  Service: Urology;  Laterality: N/A;   CYSTOSCOPY/RETROGRADE/URETEROSCOPY Right 04/22/2021   Procedure: CYSTOSCOPY/RETROGRADE/URETEROSCOPY/ STENT  PLACEMENT;  Surgeon: Ceasar Mons, MD;  Location: [facility];  Service: Urology;  Laterality: Right;   EYE SURGERY Bilateral 1987   tear ducts   IR IMAGING GUIDED PORT INSERTION  06/04/2020   THYROID LOBECTOMY Right 1979   TOTAL HIP ARTHROPLASTY Right 01/28/2016   Procedure: RIGHT TOTAL HIP ARTHROPLASTY ANTERIOR APPROACH;  Surgeon: [name], MD;  Location: [facility];  Service: Orthopedics;  Laterality: Right;   UMBILICAL HERNIA REPAIR  1980s   VIDEO  BRONCHOSCOPY WITH ENDOBRONCHIAL NAVIGATION N/A 06/06/2020   Procedure: VIDEO BRONCHOSCOPY WITH ENDOBRONCHIAL NAVIGATION;  Surgeon: Collene Gobble, MD;  Location: [place];  Service: Thoracic;  Laterality: N/A;   VIDEO BRONCHOSCOPY WITH ENDOBRONCHIAL NAVIGATION N/A 10/10/2020   Procedure: VIDEO BRONCHOSCOPY WITH ENDOBRONCHIAL NAVIGATION;  Surgeon: [name], MD;  Location: [place];  Service: Thoracic;  Laterality: N/A;    Social History:  reports that she quit smoking about 17 months ago. Her smoking use included cigarettes. She has a 45.00 pack-year smoking history. She has never used smokeless tobacco. She reports that she does not currently use alcohol. She reports that she does not use drugs.  Allergies: Allergies  Allergen Reactions   Iodine Shortness Of Breath   Keflex [Cephalexin] Shortness Of Breath, Rash and Tinitus   Shellfish Allergy Hives   Lasix [Furosemide] Itching   Rosuvastatin Other (See Comments)    Muscle Pain in Thighs   Latex Rash   Lisinopril Cough   Sulfa Antibiotics Nausea Only   Tramadol Itching    Family History:  Family History  Problem Relation Age of Onset   Arthritis Mother    Breast cancer Mother 42   Schizophrenia Mother    Diabetes Father    Heart disease Father    Kidney disease Father    Stroke Sister    Lung cancer Brother        dx > 35; work-related exposures   Heart disease Brother    Heart disease Brother    Vision loss Brother        Glaucoma   Alcohol abuse Brother    Lung cancer Maternal Aunt        dx > 32   Colon cancer Paternal Aunt        dx > 60   Head & neck cancer Paternal Uncle        dx 59s   Ovarian cancer Daughter 78   Breast cancer Niece 69   Leukemia Cousin        d. 97s     Current Outpatient Medications:    acetaminophen (TYLENOL) 500 MG tablet, Take 500-1,500 mg by mouth every 8 (eight) hours as needed for moderate pain., Disp: , Rfl:    albuterol (VENTOLIN HFA) 108 (90 Base) MCG/ACT inhaler,  Inhale 1-2 puffs into the lungs every 6 (six) hours as needed for wheezing or shortness of breath., Disp: 18 g, Rfl: 2   aspirin EC 81 MG tablet, Take 81 mg by mouth daily. Swallow whole., Disp: , Rfl:    atorvastatin (LIPITOR) 20 MG tablet, TAKE 1 TABLET (20 MG TOTAL) BY MOUTH DAILY. (Patient taking differently: Take 20 mg by mouth at bedtime.), Disp: 90 tablet, Rfl: 1   calcium carbonate (TUMS - DOSED IN MG ELEMENTAL CALCIUM) 500 MG chewable tablet, Chew 500 mg by mouth daily as needed for indigestion or heartburn., Disp: , Rfl:    EPINEPHrine 0.3 mg/0.3 mL IJ SOAJ injection, Inject 0.3 mg into the muscle as  needed for anaphylaxis., Disp: 1 each, Rfl: 2   estradiol-norethindrone (ACTIVELLA) 1-0.5 MG tablet, Take 1 tablet by mouth daily. (Patient taking differently: Take 1 tablet by mouth every other day. Takes in evening), Disp: 28 tablet, Rfl: 5   ezetimibe (ZETIA) 10 MG tablet, TAKE 1 TABLET EVERY DAY (Patient taking differently: Take 10 mg by mouth at bedtime.), Disp: 90 tablet, Rfl: 1   levothyroxine (SYNTHROID) 125 MCG tablet, TAKE 1 TABLET AT BEDTIME (Patient taking differently: Take 125 mcg by mouth at bedtime.), Disp: 90 tablet, Rfl: 1   lidocaine-prilocaine (EMLA) cream, Apply 1 application topically as needed., Disp: 30 g, Rfl: 0   losartan-hydrochlorothiazide (HYZAAR) 50-12.5 MG tablet, TAKE 1 TABLET EVERY DAY (Patient taking differently: Take 1 tablet by mouth daily.), Disp: 90 tablet, Rfl: 3   oxybutynin (DITROPAN) 5 MG tablet, Take 1 tablet (5 mg total) by mouth every 8 (eight) hours as needed for bladder spasms., Disp: 30 tablet, Rfl: 1   vitamin B-12 (CYANOCOBALAMIN) 1000 MCG tablet, Take 1,000 mcg by mouth daily., Disp: , Rfl:    XIIDRA 5 % SOLN, Place 1 drop into both eyes 2 (two) times daily as needed (dry eyes)., Disp: , Rfl:    ondansetron (ZOFRAN) 4 MG tablet, Take 1 tablet (4 mg total) by mouth every 8 (eight) hours as needed for nausea or vomiting., Disp: 20 tablet, Rfl:  0  Review of Systems:  Constitutional: Denies fever, chills, diaphoresis, appetite change. HEENT: Denies photophobia, eye pain, redness, hearing loss, ear pain, congestion, sore throat, rhinorrhea, sneezing, mouth sores, trouble swallowing, neck pain, neck stiffness and tinnitus.   Respiratory: Denies  cough, chest tightness. Cardiovascular: Denies chest pain, palpitations and leg swelling.  Gastrointestinal: Denies nausea, vomiting, abdominal pain, diarrhea, constipation, blood in stool and abdominal distention.  Genitourinary: Denies dysuria, urgency, frequency, hematuria, flank pain and difficulty urinating.  Endocrine: Denies: hot or cold intolerance, sweats, changes in hair or nails, polyuria, polydipsia. Musculoskeletal: Denies myalgias, back pain, joint swelling, arthralgias and gait problem.  Skin: Denies pallor, rash and wound.  Neurological: Denies dizziness, seizures, syncope, weakness, light-headedness, numbness and headaches.  Hematological: Denies adenopathy. Easy bruising, personal or family bleeding history  Psychiatric/Behavioral: Denies suicidal ideation, mood changes, confusion, nervousness, sleep disturbance and agitation    Physical Exam: Vitals:   09/22/21 1310  BP: 120/64  Pulse: 84  Temp: 98.3 F (36.8 C)  TempSrc: Oral  SpO2: 96%  Weight: 273 lb (123.8 kg)    Body mass index is 44.06 kg/m.   Constitutional: NAD, calm, comfortable, obese Eyes: PERRL, lids and conjunctivae normal ENMT: Mucous membranes are moist.  Respiratory: clear to auscultation bilaterally, no wheezing, no crackles. Normal respiratory effort. No accessory muscle use.  Cardiovascular: Regular rate and rhythm, no murmurs / rubs / gallops. No extremity edema. Neurologic: Grossly intact and nonfocal Psychiatric: Normal judgment and insight. Alert and oriented x 3. Normal mood.    Impression and Plan:  Primary cancer of right upper lobe of lung ([place]) Cancer of renal pelvis,  unspecified laterality ([place]) -Followed by oncology.  Morbid obesity ([place]) -Discussed healthy lifestyle, including increased physical activity and better food choices to promote weight loss.  Postoperative hypothyroidism -Last TSH was within limits, she is on levothyroxine supplementation.  Essential hypertension -Well-controlled.  Chronic kidney disease, stage 3a ([place]) -Last measured creatinine was 1.350  Prediabetes -Check A1c, last creatinine was 6.2 in July 2022  Time spent: 32 minutes reviewing chart, interviewing and examining patient and formulating plan of care.  [name], MD [facility]

## 2021-09-23 ENCOUNTER — Encounter: Payer: Self-pay | Admitting: Internal Medicine

## 2021-09-23 DIAGNOSIS — C659 Malignant neoplasm of unspecified renal pelvis: Secondary | ICD-10-CM

## 2021-09-23 MED ORDER — ONDANSETRON HCL 4 MG PO TABS: 4.0000 mg | ORAL_TABLET | Freq: Three times a day (TID) | ORAL | 0 refills | Status: DC | PRN

## 2021-09-28 ENCOUNTER — Encounter: Payer: Self-pay | Admitting: Internal Medicine

## 2021-09-28 ENCOUNTER — Ambulatory Visit (INDEPENDENT_AMBULATORY_CARE_PROVIDER_SITE_OTHER): Payer: Medicare HMO | Admitting: Internal Medicine

## 2021-09-28 VITALS — BP 136/70 | HR 83 | Temp 97.9°F | Ht 66.0 in | Wt 273.2 lb

## 2021-09-28 DIAGNOSIS — G8929 Other chronic pain: Secondary | ICD-10-CM

## 2021-09-28 DIAGNOSIS — E890 Postprocedural hypothyroidism: Secondary | ICD-10-CM

## 2021-09-28 DIAGNOSIS — R252 Cramp and spasm: Secondary | ICD-10-CM

## 2021-09-28 DIAGNOSIS — M545 Low back pain, unspecified: Secondary | ICD-10-CM | POA: Diagnosis not present

## 2021-09-28 MED ORDER — CYCLOBENZAPRINE HCL 5 MG PO TABS: 5.0000 mg | ORAL_TABLET | Freq: Every evening | ORAL | 0 refills | Status: DC | PRN

## 2021-09-28 MED ORDER — MELOXICAM 7.5 MG PO TABS: 7.5000 mg | ORAL_TABLET | Freq: Every day | ORAL | 0 refills | Status: DC

## 2021-09-28 NOTE — Progress Notes (Signed)
Acute office Visit     This visit occurred during the SARS-CoV-2 public health emergency.  Safety protocols were in place, including screening questions prior to the visit, additional usage of staff PPE, and extensive cleaning of exam room while observing appropriate contact time as indicated for disinfecting solutions.    CC/Reason for Visit: Discuss acute concerns  HPI: Maria Marquez is a 76 y.o. female who is coming in today for the above mentioned reasons.  She is here today to discuss 2 main concerns:  1.  She has been having left hand cramping.  It is around the medial side involving the hypothenar eminence.  The whole thing collapses downwards, it is painful, her hand appears deformed.  2.  She has been having chronic low back pain, no radiculopathy, no saddle anesthesia, no bowel or bladder incontinence.  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[facility])    urologist-- dr winter/  oncologist-- dr Alen Blew;   dx 08/ 2021 ;  chemo 10/ 2021  to 12/ 2021   Chronic kidney disease, stage 3a ([place]) 12/31/2019   Dyspnea    12-26-2020  per pt no issue most of the time   Family history of breast cancer 05/04/2021   Family history of ovarian cancer 05/04/2021   GERD (gastroesophageal reflux disease)    per pt occasionally takes tums   History of basal cell carcinoma (BCC) excision    per pt in 1970s on nose   History of chemotherapy    06-13-2020  to 12/ 2021  for right renal pelvis carcinoma   History of colon polyps    History of radiation therapy    11-13-2020 to 11-26-2020---   Right Lung- SBRT- Dr. Gery Pray   HLD (hyperlipidemia)    Hypertension    followed by pcp   Hypothyroidism, postsurgical 1979   followed by pcp   Leukocytosis    Nocturia    OA (osteoarthritis)     PONV (postoperative nausea and vomiting)    Since chemo (always nauseous)   Pre-diabetes    Primary adenocarcinoma of upper lobe of right lung [facility])    oncologist--- shadad/  pulmonology-- dr byrum;  dx 02/ 2022;  s/p  SBRT 11-13-2020 to 11-26-2020   Urgency of urination    Wears partial dentures    lower    Past Surgical History:  Procedure Laterality Date   COLONOSCOPY  last one 2017   CYSTOSCOPY WITH BIOPSY Right 09/16/2021   Procedure: CYSTOSCOPY / URETEROSCOPY WITH BIOPSY/bugbee fulgeration/right retrograde;  Surgeon: Ceasar Mons, MD;  Location: [facility];  Service: Urology;  Laterality: Right;   CYSTOSCOPY WITH URETEROSCOPY Right 04/18/2020   Procedure: CYSTOSCOPY WITH URETEROSCOPY, BIOPSY;  Surgeon: Ceasar Mons, MD;  [facility];  Service: Urology;  Laterality: Right;  ONLY NEEDS 45 MIN   CYSTOSCOPY WITH URETEROSCOPY AND STENT PLACEMENT N/A 12/31/2020   Procedure: CYSTOSCOPY WITH RIGHT URETEROSCOPY/ BILATERAL RETROGRADE PYELOGRAM/RIGHT URETERAL BIOPSY/ LASER ABLATION/RIGHT STENT PLACEMENT;  Surgeon: Ceasar Mons, MD;  Location: [facility];  Service: Urology;  Laterality: N/A;   CYSTOSCOPY/RETROGRADE/URETEROSCOPY Right 04/22/2021   Procedure: CYSTOSCOPY/RETROGRADE/URETEROSCOPY/ STENT PLACEMENT;  Surgeon: Ceasar Mons, MD;  Location: [facility];  Service: Urology;  Laterality: Right;   EYE SURGERY  Bilateral 1987   tear ducts   IR IMAGING GUIDED PORT INSERTION  06/04/2020   THYROID LOBECTOMY Right 1979   TOTAL HIP ARTHROPLASTY Right 01/28/2016   Procedure: RIGHT TOTAL HIP ARTHROPLASTY ANTERIOR APPROACH;  Surgeon: [name], MD;  Location[facility];  Service: Orthopedics;  Laterality: Right;   UMBILICAL HERNIA REPAIR  1980s   VIDEO BRONCHOSCOPY WITH ENDOBRONCHIAL NAVIGATION N/A 06/06/2020   Procedure: VIDEO BRONCHOSCOPY WITH ENDOBRONCHIAL NAVIGATION;  Surgeon: Collene Gobble, MD;   Location: [place] [place];  Service: Thoracic;  Laterality: N/A;   VIDEO BRONCHOSCOPY WITH ENDOBRONCHIAL NAVIGATION N/A 10/10/2020   Procedure: VIDEO BRONCHOSCOPY WITH ENDOBRONCHIAL NAVIGATION;  Surgeon: [name], MD;  Location: [place];  Service: Thoracic;  Laterality: N/A;    Social History:  reports that she quit smoking about 17 months ago. Her smoking use included cigarettes. She has a 45.00 pack-year smoking history. She has never used smokeless tobacco. She reports that she does not currently use alcohol. She reports that she does not use drugs.  Allergies: Allergies  Allergen Reactions   Iodine Shortness Of Breath   Keflex [Cephalexin] Shortness Of Breath, Rash and Tinitus   Shellfish Allergy Hives   Lasix [Furosemide] Itching   Rosuvastatin Other (See Comments)    Muscle Pain in Thighs   Latex Rash   Lisinopril Cough   Sulfa Antibiotics Nausea Only   Tramadol Itching    Family History:  Family History  Problem Relation Age of Onset   Arthritis Mother    Breast cancer Mother 35   Schizophrenia Mother    Diabetes Father    Heart disease Father    Kidney disease Father    Stroke Sister    Lung cancer Brother        dx > 67; work-related exposures   Heart disease Brother    Heart disease Brother    Vision loss Brother        Glaucoma   Alcohol abuse Brother    Lung cancer Maternal Aunt        dx > 9   Colon cancer Paternal Aunt        dx > 22   Head & neck cancer Paternal Uncle        dx 86s   Ovarian cancer Daughter 42   Breast cancer Niece 11   Leukemia Cousin        d. 35s     Current Outpatient Medications:    cyclobenzaprine (FLEXERIL) 5 MG tablet, Take 1 tablet (5 mg total) by mouth at bedtime as needed for muscle spasms., Disp: 30 tablet, Rfl: 0   meloxicam (MOBIC) 7.5 MG tablet, Take 1 tablet (7.5 mg total) by mouth daily., Disp: 10 tablet, Rfl: 0   acetaminophen (TYLENOL) 500 MG tablet, Take 500-1,500 mg by mouth every 8 (eight) hours as needed  for moderate pain., Disp: , Rfl:    albuterol (VENTOLIN HFA) 108 (90 Base) MCG/ACT inhaler, Inhale 1-2 puffs into the lungs every 6 (six) hours as needed for wheezing or shortness of breath., Disp: 18 g, Rfl: 2   aspirin EC 81 MG tablet, Take 81 mg by mouth daily. Swallow whole., Disp: , Rfl:    atorvastatin (LIPITOR) 20 MG tablet, TAKE 1 TABLET (20 MG TOTAL) BY MOUTH DAILY. (Patient taking differently: Take 20 mg by mouth at bedtime.), Disp: 90 tablet, Rfl: 1   calcium carbonate (TUMS - DOSED IN MG ELEMENTAL CALCIUM) 500 MG chewable tablet, Chew 500 mg by mouth daily as  needed for indigestion or heartburn., Disp: , Rfl:    EPINEPHrine 0.3 mg/0.3 mL IJ SOAJ injection, Inject 0.3 mg into the muscle as needed for anaphylaxis., Disp: 1 each, Rfl: 2   estradiol-norethindrone (ACTIVELLA) 1-0.5 MG tablet, Take 1 tablet by mouth daily. (Patient taking differently: Take 1 tablet by mouth every other day. Takes in evening), Disp: 28 tablet, Rfl: 5   ezetimibe (ZETIA) 10 MG tablet, TAKE 1 TABLET EVERY DAY (Patient taking differently: Take 10 mg by mouth at bedtime.), Disp: 90 tablet, Rfl: 1   levothyroxine (SYNTHROID) 125 MCG tablet, TAKE 1 TABLET AT BEDTIME (Patient taking differently: Take 125 mcg by mouth at bedtime.), Disp: 90 tablet, Rfl: 1   lidocaine-prilocaine (EMLA) cream, Apply 1 application topically as needed., Disp: 30 g, Rfl: 0   losartan-hydrochlorothiazide (HYZAAR) 50-12.5 MG tablet, TAKE 1 TABLET EVERY DAY (Patient taking differently: Take 1 tablet by mouth daily.), Disp: 90 tablet, Rfl: 3   ondansetron (ZOFRAN) 4 MG tablet, Take 1 tablet (4 mg total) by mouth every 8 (eight) hours as needed for nausea or vomiting., Disp: 20 tablet, Rfl: 0   oxybutynin (DITROPAN) 5 MG tablet, Take 1 tablet (5 mg total) by mouth every 8 (eight) hours as needed for bladder spasms., Disp: 30 tablet, Rfl: 1   vitamin B-12 (CYANOCOBALAMIN) 1000 MCG tablet, Take 1,000 mcg by mouth daily., Disp: , Rfl:    XIIDRA 5 %  SOLN, Place 1 drop into both eyes 2 (two) times daily as needed (dry eyes)., Disp: , Rfl:   Review of Systems:  Constitutional: Denies fever, chills, diaphoresis, appetite change and fatigue.  HEENT: Denies photophobia, eye pain, redness, hearing loss, ear pain, congestion, sore throat, rhinorrhea, sneezing, mouth sores, trouble swallowing, neck pain, neck stiffness and tinnitus.   Respiratory: Denies SOB, DOE, cough, chest tightness,  and wheezing.   Cardiovascular: Denies chest pain, palpitations and leg swelling.  Gastrointestinal: Denies nausea, vomiting, abdominal pain, diarrhea, constipation, blood in stool and abdominal distention.  Genitourinary: Denies dysuria, urgency, frequency, hematuria, flank pain and difficulty urinating.  Endocrine: Denies: hot or cold intolerance, sweats, changes in hair or nails, polyuria, polydipsia. Musculoskeletal: Positive for myalgias, back pain, joint swelling, arthralgias and gait problem.  Skin: Denies pallor, rash and wound.  Neurological: Denies dizziness, seizures, syncope, weakness, light-headedness, numbness and headaches.  Hematological: Denies adenopathy. Easy bruising, personal or family bleeding history  Psychiatric/Behavioral: Denies suicidal ideation, mood changes, confusion, nervousness, sleep disturbance and agitation    Physical Exam: Vitals:   09/28/21 1603  BP: 136/70  Pulse: 83  Temp: 97.9 F (36.6 C)  TempSrc: Oral  SpO2: 97%  Weight: 273 lb 3.2 oz (123.9 kg)  Height: 5\' 6"  (1.676 m)    Body mass index is 44.1 kg/m.   Constitutional: NAD, calm, comfortable Eyes: PERRL, lids and conjunctivae normal ENMT: Mucous membranes are moist.  Psychiatric: Normal judgment and insight. Alert and oriented x 3. Normal mood.    Impression and Plan:  Cramping of hands  - Plan: Comprehensive metabolic panel, Magnesium, Phosphorus, Phosphorus, Magnesium, Comprehensive metabolic panel -I believe that she might have some electrolyte  abnormalities, mainly hypocalcemia but will also check potassium, magnesium, phosphorus.  Chronic bilateral low back pain without sciatica  - Plan: Ambulatory referral to Physical Therapy, meloxicam (MOBIC) 7.5 MG tablet, cyclobenzaprine (FLEXERIL) 5 MG tablet -She has no red flag symptoms, I do not believe we need to jump straight to imaging. -We will refer to physical therapy, will prescribe meloxicam for 10 days, she  may take Flexeril at bedtime as needed, we have also discussed about local therapies such as massage, soaks in warm water, heating pad. -She will follow-up with [name] if no significant improvement in 8 to 12 weeks.  Postoperative hypothyroidism  - Plan: TSH, TSH  Time spent: 30 minutes reviewing chart, interviewing and examining patient and formulating plan of care.     [name], MD [facility]

## 2021-09-29 ENCOUNTER — Encounter: Payer: Self-pay | Admitting: Oncology

## 2021-09-29 LAB — PHOSPHORUS: Phosphorus: 2.5 mg/dL (ref 2.3–4.6)

## 2021-09-29 LAB — COMPREHENSIVE METABOLIC PANEL
ALT: 20 U/L (ref 0–35)
AST: 17 U/L (ref 0–37)
Albumin: 4.1 g/dL (ref 3.5–5.2)
Alkaline Phosphatase: 100 U/L (ref 39–117)
BUN: 25 mg/dL — ABNORMAL HIGH (ref 6–23)
CO2: 31 mEq/L (ref 19–32)
Calcium: 9.2 mg/dL (ref 8.4–10.5)
Chloride: 100 mEq/L (ref 96–112)
Creatinine, Ser: 1.52 mg/dL — ABNORMAL HIGH (ref 0.40–1.20)
GFR: 33.78 mL/min — ABNORMAL LOW (ref >60.00)
Glucose, Bld: 139 mg/dL — ABNORMAL HIGH (ref 70–99)
Potassium: 4 mEq/L (ref 3.5–5.1)
Sodium: 137 mEq/L (ref 135–145)
Total Bilirubin: 0.8 mg/dL (ref 0.2–1.2)
Total Protein: 7.5 g/dL (ref 6.0–8.3)

## 2021-09-29 LAB — MAGNESIUM: Magnesium: 2.1 mg/dL (ref 1.5–2.5)

## 2021-09-29 LAB — TSH: TSH: 7.26 u[IU]/mL — ABNORMAL HIGH (ref 0.35–5.50)

## 2021-09-29 MED ORDER — LEVOTHYROXINE SODIUM 150 MCG PO TABS: 150.0000 ug | ORAL_TABLET | Freq: Every day | ORAL | 1 refills | Status: DC

## 2021-09-29 MED ORDER — LEVOTHYROXINE SODIUM 150 MCG PO TABS: 150.0000 ug | ORAL_TABLET | Freq: Every day | ORAL | 0 refills | Status: DC

## 2021-09-29 NOTE — Addendum Note (Signed)
Addended by: Agnes Lawrence on: 09/29/2021 04:04 PM   Modules accepted: Orders

## 2021-09-30 ENCOUNTER — Other Ambulatory Visit: Payer: Self-pay

## 2021-09-30 ENCOUNTER — Ambulatory Visit: Payer: Medicare HMO | Attending: Internal Medicine | Admitting: Rehabilitative and Restorative Service Providers"

## 2021-09-30 ENCOUNTER — Encounter: Payer: Self-pay | Admitting: Rehabilitative and Restorative Service Providers"

## 2021-09-30 DIAGNOSIS — M62830 Muscle spasm of back: Secondary | ICD-10-CM | POA: Diagnosis not present

## 2021-09-30 DIAGNOSIS — G8929 Other chronic pain: Secondary | ICD-10-CM | POA: Diagnosis not present

## 2021-09-30 DIAGNOSIS — R2689 Other abnormalities of gait and mobility: Secondary | ICD-10-CM | POA: Diagnosis not present

## 2021-09-30 DIAGNOSIS — R262 Difficulty in walking, not elsewhere classified: Secondary | ICD-10-CM | POA: Diagnosis not present

## 2021-09-30 DIAGNOSIS — M545 Low back pain, unspecified: Secondary | ICD-10-CM | POA: Diagnosis present

## 2021-09-30 DIAGNOSIS — M6281 Muscle weakness (generalized): Secondary | ICD-10-CM | POA: Diagnosis not present

## 2021-09-30 NOTE — Therapy (Signed)
OUTPATIENT PHYSICAL THERAPY THORACOLUMBAR EVALUATION   Patient Name: Maria Marquez MRN: [id number] DOB:05/31/1946, 76 y.o., female Today's Date: 09/30/2021   PT End of Session - 09/30/21 1302     Visit Number 1    Date for PT Re-Evaluation 11/20/21    Authorization Type [employer]             Past Medical History:  Diagnosis Date   Anemia associated with chemotherapy    followed by dr Alen Blew   Bilateral lower extremity edema    per pt occasionally ankles swell   Carcinoma of renal pelvis, right [facility])    urologist-- dr winter/  oncologist-- dr Alen Blew;   dx 08/ 2021 ;  chemo 10/ 2021  to 12/ 2021   Chronic kidney disease, stage 3a ([place]) 12/31/2019   Dyspnea    12-26-2020  per pt no issue most of the time   Family history of breast cancer 05/04/2021   Family history of ovarian cancer 05/04/2021   GERD (gastroesophageal reflux disease)    per pt occasionally takes tums   History of basal cell carcinoma (BCC) excision    per pt in 1970s on nose   History of chemotherapy    06-13-2020  to 12/ 2021  for right renal pelvis carcinoma   History of colon polyps    History of radiation therapy    11-13-2020 to 11-26-2020---   Right Lung- SBRT- Dr. Gery Pray   HLD (hyperlipidemia)    Hypertension    followed by pcp   Hypothyroidism, postsurgical 1979   followed by pcp   Leukocytosis    Nocturia    OA (osteoarthritis)    PONV (postoperative nausea and vomiting)    Since chemo (always nauseous)   Pre-diabetes    Primary adenocarcinoma of upper lobe of right lung [facility])    oncologist--- shadad/  pulmonology-- dr byrum;  dx 02/ 2022;  s/p  SBRT 11-13-2020 to 11-26-2020   Urgency of urination    Wears partial dentures    lower   Past Surgical History:  Procedure Laterality Date   COLONOSCOPY  last one 2017   CYSTOSCOPY WITH BIOPSY Right 09/16/2021   Procedure: CYSTOSCOPY / URETEROSCOPY WITH BIOPSY/bugbee fulgeration/right retrograde;  Surgeon: Ceasar Mons, MD;  Location: [facility];  Service: Urology;  Laterality: Right;   CYSTOSCOPY WITH URETEROSCOPY Right 04/18/2020   Procedure: CYSTOSCOPY WITH URETEROSCOPY, BIOPSY;  Surgeon: Ceasar Mons, MD;  [facility];  Service: Urology;  Laterality: Right;  ONLY NEEDS 45 MIN   CYSTOSCOPY WITH URETEROSCOPY AND STENT PLACEMENT N/A 12/31/2020   Procedure: CYSTOSCOPY WITH RIGHT URETEROSCOPY/ BILATERAL RETROGRADE PYELOGRAM/RIGHT URETERAL BIOPSY/ LASER ABLATION/RIGHT STENT PLACEMENT;  Surgeon: Ceasar Mons, MD;  Location: [facility];  Service: Urology;  Laterality: N/A;   CYSTOSCOPY/RETROGRADE/URETEROSCOPY Right 04/22/2021   Procedure: CYSTOSCOPY/RETROGRADE/URETEROSCOPY/ STENT PLACEMENT;  Surgeon: Ceasar Mons, MD;  Location: [facility];  Service: Urology;  Laterality: Right;   EYE SURGERY Bilateral 1987   tear ducts   IR IMAGING GUIDED PORT INSERTION  06/04/2020   THYROID LOBECTOMY Right 1979   TOTAL HIP ARTHROPLASTY Right 01/28/2016   Procedure: RIGHT TOTAL HIP ARTHROPLASTY ANTERIOR APPROACH;  Surgeon: [name], MD;  [facility];  Service: Orthopedics;  Laterality: Right;   UMBILICAL HERNIA REPAIR  1980s   VIDEO BRONCHOSCOPY WITH ENDOBRONCHIAL NAVIGATION N/A 06/06/2020   Procedure: VIDEO BRONCHOSCOPY WITH ENDOBRONCHIAL NAVIGATION;  Surgeon: Collene Gobble, MD;  Location: [place];  Service:  Thoracic;  Laterality: N/A;   VIDEO BRONCHOSCOPY WITH ENDOBRONCHIAL NAVIGATION N/A 10/10/2020   Procedure: VIDEO BRONCHOSCOPY WITH ENDOBRONCHIAL NAVIGATION;  Surgeon: [name], MD;  Location: [place];  Service: Thoracic;  Laterality: N/A;   Patient Active Problem List   Diagnosis Date Noted   Genetic testing 06/02/2021   Family history of breast cancer 05/04/2021   Family history of ovarian cancer 05/04/2021   Family history of colon cancer 05/04/2021   Vitamin D deficiency 03/20/2021   Primary cancer of  right upper lobe of lung ([place]) 10/21/2020   Port-A-Cath in place 06/13/2020   Pulmonary nodule 06/03/2020   Cancer of renal pelvis, unspecified laterality ([place]) 05/26/2020   Chronic kidney disease, stage 3a ([place]) 12/31/2019   Prediabetes 12/31/2019   Obesity (BMI 30-39.9) 12/31/2019   Major depression, recurrent, chronic ([place]) 10/24/2019   Leg edema 10/24/2019   Medication intolerance 10/24/2019   Benign microscopic hematuria 09/17/2019   Myalgia due to statin 77/82/4235   Umbilical hernia 36/14/4315   Sessile colonic polyp 10/04/2017   Tobacco dependence 12/09/2015   Status post right hip replacement 07/21/2015   ACE inhibitor intolerance 06/17/2015   DJD (degenerative joint disease), lumbar 06/17/2015   Essential hypertension 06/17/2015   Mixed hyperlipidemia 06/17/2015   Morbid obesity ([place]) 06/17/2015   Post-menopause on HRT (hormone replacement therapy) 06/17/2015   Postoperative hypothyroidism 06/17/2015   Primary osteoarthritis involving multiple joints 06/17/2015    PCP: [name], Rayford Halsted, MD  REFERRING PROVIDER: [name], [place]*  REFERRING DIAG: Chronic bilateral low back pain without sciatica  THERAPY DIAG:  Difficulty in walking, not elsewhere classified  Balance problem  Muscle weakness (generalized)  Chronic low back pain, unspecified back pain laterality, unspecified whether sciatica present  Muscle spasm of back  ONSET DATE: 09/28/2021   SUBJECTIVE:                                                                                                                                                                                           SUBJECTIVE STATEMENT: Pt reports that she has been getting weaker since undergoing chemo and states that she has now completed chemo and goes for CT scan soon.  Pt reports that she has had back pain for about 15 years when she was caring for her husband and he was total care.  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  Pt reports that she can stand for about 5 minutes before she starts having pain. PERTINENT HISTORY:  R THA 6 years ago, Hypertension, hyperlipidemia, stage IIIa chronic kidney disease, high-grade urothelial cancer and lung cancer.  She is followed by Dr. Alen Blew.   PAIN:  Are you having pain? Yes NPRS scale: 9/10 Pain location: back Pain orientation: Lower  PAIN TYPE: tight Pain description: intermittent  Aggravating factors: standing Relieving factors: certain positions  PRECAUTIONS: None  WEIGHT BEARING RESTRICTIONS No  FALLS:  Has patient fallen in last 6 months? No, Number of falls: 0  LIVING ENVIRONMENT: Lives with: lives alone Lives in: House/apartment Stairs: Yes; External: 2 steps; none Has following equipment at home: Single point cane  OCCUPATION: retired as Production assistant, radio  PLOF: Independent  PATIENT GOALS:  Pt would like to resume daily activities without pain, like walking and cooking.   OBJECTIVE:   DIAGNOSTIC FINDINGS:  CT scans for cancer in the future  PATIENT SURVEYS:  Modified Oswestry 28 / 50 = 56.0 %  FOTO 48%  SCREENING FOR RED FLAGS: Bowel or bladder incontinence: Yes: pt reports having a little bit of bladder incontinence and is working with Marketing executive. Spinal tumors: No Cauda equina syndrome: No Compression fracture: No Abdominal aneurysm: No  COGNITION:  Overall cognitive status: Within functional limits for tasks assessed     SENSATION:  Light touch: Appears intact    MUSCLE LENGTH: Hamstrings: Right 55 deg; Left 55 deg  POSTURE:  Pt with decreased lumbar lordosis with forward flexed posture.  PALPATION: Tender to palpation along lumbar paraspinals with spasms noted.  LUMBARAROM/PROM  A/PROM A/PROM  09/30/2021  Flexion 50  Extension 30  Right lateral flexion   Left lateral flexion   Right rotation   Left rotation    (Blank rows = not tested)  LE  AROM/PROM:   WFL   LE MMT:  BLE strength grossly 4 to 4+/5 throughout.  LUMBAR SPECIAL TESTS:  Straight leg raise test: Positive  FUNCTIONAL TESTS:  5 times sit to stand: 14.9 sec with limited UE use pushing up from thighs. Timed up and go (TUG): 11.8 sec  GAIT: Distance walked: 150 ft Assistive device utilized: None Level of assistance: Complete Independence Comments: antalgic gait pattern noted.    TODAY'S TREATMENT   09/30/2021 Evaluation Seated piriformis stretch x20 sec hold B Seated hamstring stretch x20 sec hold B Transversus Abdominus set x10 sec   PATIENT EDUCATION:  Education details: Pt educated in [place]. Person educated: Patient Education method: Explanation, Demonstration, and Handouts Education comprehension: verbalized understanding and returned demonstration   HOME EXERCISE PROGRAM: Access Code: [email or web address] URL: [email or web address] Date: 09/30/2021 Prepared by: Shelby Dubin Leemon Ayala  Exercises Seated Transversus Abdominis Bracing - 1-2 x daily - 7 x weekly - 2 sets - 10 reps - 2-5 sec hold Seated Table Hamstring Stretch - 1-2 x daily - 7 x weekly - 1 sets - 2 reps - 20 sec hold Seated Piriformis Stretch - 1-2 x daily - 7 x weekly - 1 sets - 2 reps - 20 sec hold   ASSESSMENT:  CLINICAL IMPRESSION: Patient is a 76 y.o. female who was seen today for physical therapy evaluation and treatment for low back pain. Objective impairments include decreased balance, decreased endurance, difficulty walking, decreased strength, increased muscle spasms, impaired flexibility, postural dysfunction, and pain. These impairments are limiting patient from cleaning and community activity. Personal factors including Age and 1-2 comorbidities: Hx of cancer, R THA  are also affecting patient's functional outcome. Patient will benefit from skilled PT to address above impairments and improve overall function.  REHAB POTENTIAL: Good  CLINICAL DECISION MAKING:  Evolving/moderate complexity  EVALUATION COMPLEXITY: Moderate   GOALS: Goals reviewed with patient?  Yes  SHORT TERM GOALS:  STG Name Target Date Goal status  1 Pt will be independent with initial HEP. Baseline:  10/14/2021 INITIAL  2 Pt will undergo BERG balance test to further assess balance. Baseline:  10/14/2021 INITIAL   LONG TERM GOALS:   LTG Name Target Date Goal status  1 Pt will be independent with advanced HEP. Baseline: 11/25/2021 INITIAL  2 Pt will increase FOTO to at least 53% to demonstrate improved functional mobility. Baseline:  48% 11/25/2021 INITIAL  3 Pt will decrease modified Oswestry to at least 45% to demonstrate decreased pain. Baseline: 56% 11/25/2021 INITIAL  4 Pt will have a BERG score of at least 48/56 to place her at a low risk of falling. Baseline: 11/25/2021 INITIAL   PLAN: PT FREQUENCY: 2x/week  PT DURATION: 8 weeks  PLANNED INTERVENTIONS: Therapeutic exercises, Therapeutic activity, Neuro Muscular re-education, Balance training, Gait training, Patient/Family education, Joint mobilization, Stair training, Aquatic Therapy, Dry Needling, Electrical stimulation, Cryotherapy, Moist heat, Taping, Traction, Ionotophoresis 4mg /ml Dexamethasone, and Manual therapy  PLAN FOR NEXT SESSION: assess and progress HEP as indicated, strengthening, core stability   Jalyn Rosero, PT 09/30/2021, 1:18 PM

## 2021-10-06 ENCOUNTER — Inpatient Hospital Stay: Payer: Medicare HMO | Attending: Oncology

## 2021-10-06 ENCOUNTER — Ambulatory Visit (HOSPITAL_COMMUNITY)
Admission: RE | Admit: 2021-10-06 | Discharge: 2021-10-06 | Disposition: A | Discharge status: Home / Self Care | Payer: Medicare HMO | Source: Ambulatory Visit | Attending: Oncology | Admitting: Oncology

## 2021-10-06 ENCOUNTER — Other Ambulatory Visit: Payer: Self-pay

## 2021-10-06 DIAGNOSIS — C659 Malignant neoplasm of unspecified renal pelvis: Secondary | ICD-10-CM

## 2021-10-06 DIAGNOSIS — K7689 Other specified diseases of liver: Secondary | ICD-10-CM | POA: Diagnosis not present

## 2021-10-06 DIAGNOSIS — Z8553 Personal history of malignant neoplasm of renal pelvis: Secondary | ICD-10-CM | POA: Insufficient documentation

## 2021-10-06 DIAGNOSIS — C3490 Malignant neoplasm of unspecified part of unspecified bronchus or lung: Secondary | ICD-10-CM | POA: Insufficient documentation

## 2021-10-06 DIAGNOSIS — Z923 Personal history of irradiation: Secondary | ICD-10-CM | POA: Insufficient documentation

## 2021-10-06 DIAGNOSIS — Z85118 Personal history of other malignant neoplasm of bronchus and lung: Secondary | ICD-10-CM | POA: Insufficient documentation

## 2021-10-06 DIAGNOSIS — K8020 Calculus of gallbladder without cholecystitis without obstruction: Secondary | ICD-10-CM | POA: Diagnosis not present

## 2021-10-06 DIAGNOSIS — R911 Solitary pulmonary nodule: Secondary | ICD-10-CM | POA: Diagnosis not present

## 2021-10-06 DIAGNOSIS — Z95828 Presence of other vascular implants and grafts: Secondary | ICD-10-CM

## 2021-10-06 LAB — CBC WITH DIFFERENTIAL (CANCER CENTER ONLY)
Abs Immature Granulocytes: 0.04 10*3/uL (ref 0.00–0.07)
Basophils Absolute: 0 10*3/uL (ref 0.0–0.1)
Basophils Relative: 1 %
Eosinophils Absolute: 0.3 10*3/uL (ref 0.0–0.5)
Eosinophils Relative: 3 %
HCT: 36.4 % (ref 36.0–46.0)
Hemoglobin: 12.3 g/dL (ref 12.0–15.0)
Immature Granulocytes: 1 %
Lymphocytes Relative: 18 %
Lymphs Abs: 1.4 10*3/uL (ref 0.7–4.0)
MCH: 30.3 pg (ref 26.0–34.0)
MCHC: 33.8 g/dL (ref 30.0–36.0)
MCV: 89.7 fL (ref 80.0–100.0)
Monocytes Absolute: 0.8 10*3/uL (ref 0.1–1.0)
Monocytes Relative: 9 %
Neutro Abs: 5.7 10*3/uL (ref 1.7–7.7)
Neutrophils Relative %: 68 %
Platelet Count: 266 10*3/uL (ref 150–400)
RBC: 4.06 MIL/uL (ref 3.87–5.11)
RDW: 14 % (ref 11.5–15.5)
WBC Count: 8.2 10*3/uL (ref 4.0–10.5)
nRBC: 0 % (ref 0.0–0.2)

## 2021-10-06 LAB — CMP (CANCER CENTER ONLY)
ALT: 22 U/L (ref 0–44)
AST: 18 U/L (ref 15–41)
Albumin: 3.7 g/dL (ref 3.5–5.0)
Alkaline Phosphatase: 71 U/L (ref 38–126)
Anion gap: 10 (ref 5–15)
BUN: 31 mg/dL — ABNORMAL HIGH (ref 8–23)
CO2: 25 mmol/L (ref 22–32)
Calcium: 9.2 mg/dL (ref 8.9–10.3)
Chloride: 100 mmol/L (ref 98–111)
Creatinine: 1.45 mg/dL — ABNORMAL HIGH (ref 0.44–1.00)
GFR, Estimated: 38 mL/min — ABNORMAL LOW (ref >60)
Glucose, Bld: 124 mg/dL — ABNORMAL HIGH (ref 70–99)
Potassium: 3.8 mmol/L (ref 3.5–5.1)
Sodium: 135 mmol/L (ref 135–145)
Total Bilirubin: 0.6 mg/dL (ref 0.3–1.2)
Total Protein: 7 g/dL (ref 6.5–8.1)

## 2021-10-06 MED ORDER — SODIUM CHLORIDE 0.9% FLUSH
10.0000 mL | Freq: Once | INTRAVENOUS | Status: AC
  Administered 2021-10-06: 10 mL

## 2021-10-06 MED ORDER — HEPARIN SOD (PORK) LOCK FLUSH 100 UNIT/ML IV SOLN
500.0000 [IU] | Freq: Once | INTRAVENOUS | Status: AC
  Administered 2021-10-06: 500 [IU]

## 2021-10-06 MED ORDER — HEPARIN SOD (PORK) LOCK FLUSH 100 UNIT/ML IV SOLN
500.0000 [IU] | Freq: Once | INTRAVENOUS | Status: AC
  Administered 2021-10-06: 500 [IU] via INTRAVENOUS

## 2021-10-06 MED ORDER — HEPARIN SOD (PORK) LOCK FLUSH 100 UNIT/ML IV SOLN
INTRAVENOUS | Status: AC
  Filled 2021-10-06: qty 5

## 2021-10-07 ENCOUNTER — Ambulatory Visit: Payer: Medicare HMO | Admitting: Physical Therapy

## 2021-10-07 ENCOUNTER — Encounter: Payer: Self-pay | Admitting: Physical Therapy

## 2021-10-07 DIAGNOSIS — R2689 Other abnormalities of gait and mobility: Secondary | ICD-10-CM

## 2021-10-07 DIAGNOSIS — R262 Difficulty in walking, not elsewhere classified: Secondary | ICD-10-CM | POA: Diagnosis not present

## 2021-10-07 DIAGNOSIS — M62830 Muscle spasm of back: Secondary | ICD-10-CM | POA: Diagnosis not present

## 2021-10-07 DIAGNOSIS — G8929 Other chronic pain: Secondary | ICD-10-CM

## 2021-10-07 DIAGNOSIS — M6281 Muscle weakness (generalized): Secondary | ICD-10-CM | POA: Diagnosis not present

## 2021-10-07 DIAGNOSIS — M545 Low back pain, unspecified: Secondary | ICD-10-CM

## 2021-10-07 NOTE — Therapy (Signed)
OUTPATIENT PHYSICAL THERAPY THORACOLUMBAR EVALUATION   Patient Name: Maria Marquez MRN: [id number] DOB:01/07/1946, 76 y.o., female Today's Date: 10/07/2021   PT End of Session - 10/07/21 1354     Visit Number 2    Date for PT Re-Evaluation 11/20/21    Authorization Type [employer]    PT Start Time 1355    PT Stop Time 1440    PT Time Calculation (min) 45 min    Activity Tolerance Patient tolerated treatment well    Behavior During Therapy [facility] for tasks assessed/performed             Past Medical History:  Diagnosis Date   Anemia associated with chemotherapy    followed by dr Alen Blew   Bilateral lower extremity edema    per pt occasionally ankles swell   Carcinoma of renal pelvis, right [facility])    urologist-- dr winter/  oncologist-- dr Alen Blew;   dx 08/ 2021 ;  chemo 10/ 2021  to 12/ 2021   Chronic kidney disease, stage 3a ([place]) 12/31/2019   Dyspnea    12-26-2020  per pt no issue most of the time   Family history of breast cancer 05/04/2021   Family history of ovarian cancer 05/04/2021   GERD (gastroesophageal reflux disease)    per pt occasionally takes tums   History of basal cell carcinoma (BCC) excision    per pt in 1970s on nose   History of chemotherapy    06-13-2020  to 12/ 2021  for right renal pelvis carcinoma   History of colon polyps    History of radiation therapy    11-13-2020 to 11-26-2020---   Right Lung- SBRT- Dr. Gery Pray   HLD (hyperlipidemia)    Hypertension    followed by pcp   Hypothyroidism, postsurgical 1979   followed by pcp   Leukocytosis    Nocturia    OA (osteoarthritis)    PONV (postoperative nausea and vomiting)    Since chemo (always nauseous)   Pre-diabetes    Primary adenocarcinoma of upper lobe of right lung [facility])    oncologist--- shadad/  pulmonology-- dr byrum;  dx 02/ 2022;  s/p  SBRT 11-13-2020 to 11-26-2020   Urgency of urination    Wears partial dentures    lower   Past Surgical History:  Procedure Laterality  Date   COLONOSCOPY  last one 2017   CYSTOSCOPY WITH BIOPSY Right 09/16/2021   Procedure: CYSTOSCOPY / URETEROSCOPY WITH BIOPSY/bugbee fulgeration/right retrograde;  Surgeon: Ceasar Mons, MD;  Location: [facility];  Service: Urology;  Laterality: Right;   CYSTOSCOPY WITH URETEROSCOPY Right 04/18/2020   Procedure: CYSTOSCOPY WITH URETEROSCOPY, BIOPSY;  Surgeon: Ceasar Mons, MD;  Location: [facility];  Service: Urology;  Laterality: Right;  ONLY NEEDS 45 MIN   CYSTOSCOPY WITH URETEROSCOPY AND STENT PLACEMENT N/A 12/31/2020   Procedure: CYSTOSCOPY WITH RIGHT URETEROSCOPY/ BILATERAL RETROGRADE PYELOGRAM/RIGHT URETERAL BIOPSY/ LASER ABLATION/RIGHT STENT PLACEMENT;  Surgeon: Ceasar Mons, MD;  Location[facility];  Service: Urology;  Laterality: N/A;   CYSTOSCOPY/RETROGRADE/URETEROSCOPY Right 04/22/2021   Procedure: CYSTOSCOPY/RETROGRADE/URETEROSCOPY/ STENT PLACEMENT;  Surgeon: Ceasar Mons, MD;  Location[facility];  Service: Urology;  Laterality: Right;   EYE SURGERY Bilateral 1987   tear ducts   IR IMAGING GUIDED PORT INSERTION  06/04/2020   THYROID LOBECTOMY Right 1979   TOTAL HIP ARTHROPLASTY Right 01/28/2016   Procedure: RIGHT TOTAL HIP ARTHROPLASTY ANTERIOR APPROACH;  Surgeon: [name], MD;  Location: [facility];  Service: Orthopedics;  Laterality: Right;   UMBILICAL HERNIA REPAIR  1980s   VIDEO BRONCHOSCOPY WITH ENDOBRONCHIAL NAVIGATION N/A 06/06/2020   Procedure: VIDEO BRONCHOSCOPY WITH ENDOBRONCHIAL NAVIGATION;  Surgeon: Collene Gobble, MD;  Location: [place];  Service: Thoracic;  Laterality: N/A;   VIDEO BRONCHOSCOPY WITH ENDOBRONCHIAL NAVIGATION N/A 10/10/2020   Procedure: VIDEO BRONCHOSCOPY WITH ENDOBRONCHIAL NAVIGATION;  Surgeon: [name], MD;  Location: [place];  Service: Thoracic;  Laterality: N/A;   Patient Active Problem List   Diagnosis Date Noted   Genetic testing 06/02/2021    Family history of breast cancer 05/04/2021   Family history of ovarian cancer 05/04/2021   Family history of colon cancer 05/04/2021   Vitamin D deficiency 03/20/2021   Primary cancer of right upper lobe of lung ([place]) 10/21/2020   Port-A-Cath in place 06/13/2020   Pulmonary nodule 06/03/2020   Cancer of renal pelvis, unspecified laterality ([place]) 05/26/2020   Chronic kidney disease, stage 3a ([place]) 12/31/2019   Prediabetes 12/31/2019   Obesity (BMI 30-39.9) 12/31/2019   Major depression, recurrent, chronic ([place]) 10/24/2019   Leg edema 10/24/2019   Medication intolerance 10/24/2019   Benign microscopic hematuria 09/17/2019   Myalgia due to statin 44/62/8638   Umbilical hernia 17/71/1657   Sessile colonic polyp 10/04/2017   Tobacco dependence 12/09/2015   Status post right hip replacement 07/21/2015   ACE inhibitor intolerance 06/17/2015   DJD (degenerative joint disease), lumbar 06/17/2015   Essential hypertension 06/17/2015   Mixed hyperlipidemia 06/17/2015   Morbid obesity ([place]) 06/17/2015   Post-menopause on HRT (hormone replacement therapy) 06/17/2015   Postoperative hypothyroidism 06/17/2015   Primary osteoarthritis involving multiple joints 06/17/2015   Subjective: I am having a lot of back pain today. I did not take the Meloxicam.   Pain: When walking 7/10, at rest 0/10. Low back and wraps around her pelvis.   Treatment: Review of initial HEP. Pt required full re-education and performance of all exercises. Seated piriformis stretch modified to semi-reclined which was easier for pt to do. Transverse abdominis 10x both seated and supine, TA clamshell 10x, Lower trunk rotation 10x, added this to HEP  Clinical Impression: Pt returns for follow up PT session following evaluation. Pt had not tried any HEP yet, claims she could not remember them. All exercises were reviewed and some required modification. Pt did not have any exacerbation of low back pain.   Plan: Gentle progression  of core strength, hip & lumbar AROM   Myrene Galas, PTA 10/07/21 2:42 PM      PCP: [name], Rayford Halsted, MD  REFERRING PROVIDER: [name], [place]*  REFERRING DIAG: Chronic bilateral low back pain without sciatica  THERAPY DIAG:  Difficulty in walking, not elsewhere classified  Balance problem  Muscle weakness (generalized)  Chronic low back pain, unspecified back pain laterality, unspecified whether sciatica present  Muscle spasm of back  ONSET DATE: 09/28/2021   SUBJECTIVE:  SUBJECTIVE STATEMENT: Pt reports that she has been getting weaker since undergoing chemo and states that she has now completed chemo and goes for CT scan soon.  Pt reports that she has had back pain for about 15 years when she was caring for her husband and he was total care.  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  Pt reports that she can stand for about 5 minutes before she starts having pain. PERTINENT HISTORY:  R THA 6 years ago, Hypertension, hyperlipidemia, stage IIIa chronic kidney disease, high-grade urothelial cancer and lung cancer.  She is followed by Dr. Alen Blew.   PAIN:  Are you having pain? Yes NPRS scale: 9/10 Pain location: back Pain orientation: Lower  PAIN TYPE: tight Pain description: intermittent  Aggravating factors: standing Relieving factors: certain positions  PRECAUTIONS: None  WEIGHT BEARING RESTRICTIONS No  FALLS:  Has patient fallen in last 6 months? No, Number of falls: 0  LIVING ENVIRONMENT: Lives with: lives alone Lives in: House/apartment Stairs: Yes; External: 2 steps; none Has following equipment at home: Single point cane  OCCUPATION: retired as Production assistant, radio  PLOF: Independent  PATIENT GOALS:  Pt would like  to resume daily activities without pain, like walking and cooking.   OBJECTIVE:   DIAGNOSTIC FINDINGS:  CT scans for cancer in the future  PATIENT SURVEYS:  Modified Oswestry 28 / 50 = 56.0 %  FOTO 48%  SCREENING FOR RED FLAGS: Bowel or bladder incontinence: Yes: pt reports having a little bit of bladder incontinence and is working with Marketing executive. Spinal tumors: No Cauda equina syndrome: No Compression fracture: No Abdominal aneurysm: No  COGNITION:  Overall cognitive status: Within functional limits for tasks assessed     SENSATION:  Light touch: Appears intact    MUSCLE LENGTH: Hamstrings: Right 55 deg; Left 55 deg  POSTURE:  Pt with decreased lumbar lordosis with forward flexed posture.  PALPATION: Tender to palpation along lumbar paraspinals with spasms noted.  LUMBARAROM/PROM  A/PROM A/PROM  10/07/2021  Flexion 50  Extension 30  Right lateral flexion   Left lateral flexion   Right rotation   Left rotation    (Blank rows = not tested)  LE AROM/PROM:   WFL   LE MMT:  BLE strength grossly 4 to 4+/5 throughout.  LUMBAR SPECIAL TESTS:  Straight leg raise test: Positive  FUNCTIONAL TESTS:  5 times sit to stand: 14.9 sec with limited UE use pushing up from thighs. Timed up and go (TUG): 11.8 sec  GAIT: Distance walked: 150 ft Assistive device utilized: None Level of assistance: Complete Independence Comments: antalgic gait pattern noted.    TODAY'S TREATMENT   09/30/2021 Evaluation Seated piriformis stretch x20 sec hold B Seated hamstring stretch x20 sec hold B Transversus Abdominus set x10 sec   PATIENT EDUCATION:  Education details: Pt educated in [place]. Person educated: Patient Education method: Explanation, Demonstration, and Handouts Education comprehension: verbalized understanding and returned demonstration   HOME EXERCISE PROGRAM: Access Code: [email or web address] URL: [email or web address] Date: 09/30/2021 Prepared by:  Shelby Dubin Menke  Exercises Seated Transversus Abdominis Bracing - 1-2 x daily - 7 x weekly - 2 sets - 10 reps - 2-5 sec hold Seated Table Hamstring Stretch - 1-2 x daily - 7 x weekly - 1 sets - 2 reps - 20 sec hold Seated Piriformis Stretch - 1-2 x daily - 7 x weekly - 1 sets - 2 reps - 20 sec hold   ASSESSMENT:  CLINICAL IMPRESSION: Patient  is a 76 y.o. female who was seen today for physical therapy evaluation and treatment for low back pain. Objective impairments include decreased balance, decreased endurance, difficulty walking, decreased strength, increased muscle spasms, impaired flexibility, postural dysfunction, and pain. These impairments are limiting patient from cleaning and community activity. Personal factors including Age and 1-2 comorbidities: Hx of cancer, R THA  are also affecting patient's functional outcome. Patient will benefit from skilled PT to address above impairments and improve overall function.  REHAB POTENTIAL: Good  CLINICAL DECISION MAKING: Evolving/moderate complexity  EVALUATION COMPLEXITY: Moderate   GOALS: Goals reviewed with patient? Yes  SHORT TERM GOALS:  STG Name Target Date Goal status  1 Pt will be independent with initial HEP. Baseline:  10/21/2021 INITIAL  2 Pt will undergo BERG balance test to further assess balance. Baseline:  10/21/2021 INITIAL   LONG TERM GOALS:   LTG Name Target Date Goal status  1 Pt will be independent with advanced HEP. Baseline: 12/02/2021 INITIAL  2 Pt will increase FOTO to at least 53% to demonstrate improved functional mobility. Baseline:  48% 12/02/2021 INITIAL  3 Pt will decrease modified Oswestry to at least 45% to demonstrate decreased pain. Baseline: 56% 12/02/2021 INITIAL  4 Pt will have a BERG score of at least 48/56 to place her at a low risk of falling. Baseline: 12/02/2021 INITIAL   PLAN: PT FREQUENCY: 2x/week  PT DURATION: 8 weeks  PLANNED INTERVENTIONS: Therapeutic exercises, Therapeutic activity,  Neuro Muscular re-education, Balance training, Gait training, Patient/Family education, Joint mobilization, Stair training, Aquatic Therapy, Dry Needling, Electrical stimulation, Cryotherapy, Moist heat, Taping, Traction, Ionotophoresis 4mg /ml Dexamethasone, and Manual therapy  PLAN FOR NEXT SESSION: assess and progress HEP as indicated, strengthening, core stability   Kyley Solow, PTA 10/07/2021, 2:42 PM

## 2021-10-09 ENCOUNTER — Encounter: Payer: Self-pay | Admitting: Internal Medicine

## 2021-10-12 ENCOUNTER — Ambulatory Visit: Payer: Self-pay | Admitting: Radiation Oncology

## 2021-10-13 ENCOUNTER — Inpatient Hospital Stay (HOSPITAL_BASED_OUTPATIENT_CLINIC_OR_DEPARTMENT_OTHER): Payer: Medicare HMO | Admitting: Oncology

## 2021-10-13 ENCOUNTER — Other Ambulatory Visit: Payer: Self-pay

## 2021-10-13 VITALS — BP 141/68 | HR 85 | Temp 97.9°F | Resp 18 | Ht 66.0 in | Wt 273.2 lb

## 2021-10-13 DIAGNOSIS — C659 Malignant neoplasm of unspecified renal pelvis: Secondary | ICD-10-CM

## 2021-10-13 DIAGNOSIS — Z923 Personal history of irradiation: Secondary | ICD-10-CM | POA: Diagnosis not present

## 2021-10-13 DIAGNOSIS — C3490 Malignant neoplasm of unspecified part of unspecified bronchus or lung: Secondary | ICD-10-CM

## 2021-10-13 DIAGNOSIS — Z8553 Personal history of malignant neoplasm of renal pelvis: Secondary | ICD-10-CM | POA: Diagnosis not present

## 2021-10-13 DIAGNOSIS — Z85118 Personal history of other malignant neoplasm of bronchus and lung: Secondary | ICD-10-CM | POA: Diagnosis not present

## 2021-10-13 NOTE — Progress Notes (Signed)
Hematology and Oncology Follow Up   Maria Marquez [id number] 24-Jan-1946 76 y.o. 10/13/2021 8:48 AM Maria Marquez, Rayford Halsted, MDHernandez Lucilla Marquez*      Principle Diagnosis: 76 year old woman with:  1.  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    2.  T1 a N0 adenocarcinoma of the lung diagnosed in February 2022.          Prior Therapy:    She is status post cystoscopy and urethroscopy and a biopsy of her renal pelvic mass completed by Dr. Lovena Neighbours in April 18, 2020.  This showed high-grade urothelial carcinoma.   She is status post bronchoscopy and a biopsy of her right upper lung mass which was not diagnostic.   Gemcitabine and cisplatin chemotherapy started on June 13, 2020.  She completed 3 cycles and cycle 4 was given without cisplatin completed in December 2021.  She is status post bronchoscopy and a biopsy completed by Dr. Roxan Hockey on October 10, 2020 which confirmed the presence of primary lung neoplasm.  SBRT to right lung cancer between March 24 to November 26, 2020.  She received total of 50 Gray and 10 fractions.  She is status post repeat cystoscopy in and biopsy as well as complete laser tumor ablation completed on Dec 31, 2020.  Final pathology showed low-grade urothelial carcinoma.  This without muscle invasive disease.   Current therapy: Active surveillance.  Interim History: Maria Marquez is here for a follow-up visit.  Since last visit, she reports feeling well without any major complaints.  She denies any recent hospitalizations or illnesses.  She denies any chest pain shortness of breath or difficulty breathing.  She denied any back pain, shoulder pain or any focal pain.  She denies any pathological fractures. .    Medications: Updated on review. Current Outpatient Medications  Medication Sig Dispense Refill   acetaminophen (TYLENOL) 500 MG tablet Take 500-1,500 mg by mouth every 8 (eight) hours as needed for  moderate pain.     albuterol (VENTOLIN HFA) 108 (90 Base) MCG/ACT inhaler Inhale 1-2 puffs into the lungs every 6 (six) hours as needed for wheezing or shortness of breath. 18 g 2   aspirin EC 81 MG tablet Take 81 mg by mouth daily. Swallow whole.     atorvastatin (LIPITOR) 20 MG tablet TAKE 1 TABLET (20 MG TOTAL) BY MOUTH DAILY. (Patient taking differently: Take 20 mg by mouth at bedtime.) 90 tablet 1   calcium carbonate (TUMS - DOSED IN MG ELEMENTAL CALCIUM) 500 MG chewable tablet Chew 500 mg by mouth daily as needed for indigestion or heartburn.     cyclobenzaprine (FLEXERIL) 5 MG tablet Take 1 tablet (5 mg total) by mouth at bedtime as needed for muscle spasms. 30 tablet 0   EPINEPHrine 0.3 mg/0.3 mL IJ SOAJ injection Inject 0.3 mg into the muscle as needed for anaphylaxis. 1 each 2   estradiol-norethindrone (ACTIVELLA) 1-0.5 MG tablet Take 1 tablet by mouth daily. (Patient taking differently: Take 1 tablet by mouth every other day. Takes in evening) 28 tablet 5   ezetimibe (ZETIA) 10 MG tablet TAKE 1 TABLET EVERY DAY (Patient taking differently: Take 10 mg by mouth at bedtime.) 90 tablet 1   levothyroxine (SYNTHROID) 150 MCG tablet Take 1 tablet (150 mcg total) by mouth daily. 30 tablet 0   levothyroxine (SYNTHROID) 150 MCG tablet Take 1 tablet (150 mcg total) by mouth daily. 90 tablet 1   lidocaine-prilocaine (EMLA) cream Apply 1 application topically as  needed. 30 g 0   losartan-hydrochlorothiazide (HYZAAR) 50-12.5 MG tablet TAKE 1 TABLET EVERY DAY (Patient taking differently: Take 1 tablet by mouth daily.) 90 tablet 3   meloxicam (MOBIC) 7.5 MG tablet Take 1 tablet (7.5 mg total) by mouth daily. 10 tablet 0   ondansetron (ZOFRAN) 4 MG tablet Take 1 tablet (4 mg total) by mouth every 8 (eight) hours as needed for nausea or vomiting. 20 tablet 0   oxybutynin (DITROPAN) 5 MG tablet Take 1 tablet (5 mg total) by mouth every 8 (eight) hours as needed for bladder spasms. 30 tablet 1   vitamin B-12  (CYANOCOBALAMIN) 1000 MCG tablet Take 1,000 mcg by mouth daily.     XIIDRA 5 % SOLN Place 1 drop into both eyes 2 (two) times daily as needed (dry eyes).     No current facility-administered medications for this visit.     Allergies:  Allergies  Allergen Reactions   Iodine Shortness Of Breath   Keflex [Cephalexin] Shortness Of Breath, Rash and Tinitus   Shellfish Allergy Hives   Lasix [Furosemide] Itching   Rosuvastatin Other (See Comments)    Muscle Pain in Thighs   Latex Rash   Lisinopril Cough   Sulfa Antibiotics Nausea Only   Tramadol Itching   Physical exam:    Blood pressure (!) 141/68, pulse 85, temperature 97.9 F (36.6 C), temperature source Temporal, resp. rate 18, height 5\' 6"  (1.676 m), weight 273 lb 3.2 oz (123.9 kg), SpO2 98 %.    ECOG 1   General appearance: Comfortable appearing without any discomfort Head: Normocephalic without any trauma Oropharynx: Mucous membranes are moist and pink without any thrush or ulcers. Eyes: Pupils are equal and round reactive to light. Lymph nodes: No cervical, supraclavicular, inguinal or axillary lymphadenopathy.   Heart:regular rate and rhythm.  S1 and S2 without leg edema. Lung: Clear without any rhonchi or wheezes.  No dullness to percussion. Abdomin: Soft, nontender, nondistended with good bowel sounds.  No hepatosplenomegaly. Musculoskeletal: No joint deformity or effusion.  Full range of motion noted. Neurological: No deficits noted on motor, sensory and deep tendon reflex exam. Skin: No petechial rash or dryness.  Appeared moist.        Lab Results: Lab Results  Component Value Date   WBC 8.2 10/06/2021   HGB 12.3 10/06/2021   HCT 36.4 10/06/2021   MCV 89.7 10/06/2021   PLT 266 10/06/2021     Chemistry      Component Value Date/Time   NA 135 10/06/2021 1015   NA 140 12/19/2019 1132   K 3.8 10/06/2021 1015   CL 100 10/06/2021 1015   CO2 25 10/06/2021 1015   BUN 31 (H) 10/06/2021 1015   BUN 25  12/19/2019 1132   CREATININE 1.45 (H) 10/06/2021 1015   GLU 92 02/04/2016 0000      Component Value Date/Time   CALCIUM 9.2 10/06/2021 1015   ALKPHOS 71 10/06/2021 1015   AST 18 10/06/2021 1015   ALT 22 10/06/2021 1015   BILITOT 0.6 10/06/2021 1015        IMPRESSION: 1. Treated right upper lobe pulmonary nodule now obscured by evolving postradiation changes. No definitive findings to suggest locally recurrent disease or metastatic disease noted in the chest, abdomen or pelvis. 2. 5 mm right upper lobe pulmonary nodule, stable compared to prior examinations, likely benign. 3. Aortic atherosclerosis, in addition to left main and three-vessel coronary artery disease. Assessment for potential risk factor modification, dietary therapy or pharmacologic therapy  may be warranted, if clinically indicated. 4. Morphologic changes in the liver indicative of underlying cirrhosis. 5. Additional incidental findings, as above. Acute    Impression and Plan:   76 year old woman with   1.    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   She is currently on active surveillance without any evidence of relapsed disease.  CT scan completed on October 06, 2021 showed no evidence of metastatic disease.  She continues to undergo cystoscopy which was completed in January 2023 without any evidence of disease relapse.  At this time I recommended continued active surveillance and institute salvage therapy if needed.  Immunotherapy versus antibody drug conjugate among others will be considered.      2.   Lung cancer diagnosed in February 2022 after presenting with stage Ia adenocarcinoma.  No evidence of relapsed disease noted after radiation therapy.  No additional treatment is needed.   3.  Renal function surveillance: Kidney function remains stable at this time without any further decline.  4.  Port-A-Cath management: Risks and benefits of Port-A-Cath removal were discussed.   At this time we have opted to continue to keep it and will be flushed every 2 months.    5.  Follow-up: She will return every 2 months for Port-A-Cath flush and MD follow-up in 6 months.  30  minutes were dedicated to this visit.  The time was spent on reviewing laboratory data, disease status update, reviewing imaging studies and future plan of care discussion.  Zola Button, MD 10/13/2021 8:48 AM

## 2021-10-14 ENCOUNTER — Ambulatory Visit: Payer: Medicare HMO | Admitting: Rehabilitative and Restorative Service Providers"

## 2021-10-14 ENCOUNTER — Encounter: Payer: Self-pay | Admitting: Rehabilitative and Restorative Service Providers"

## 2021-10-14 DIAGNOSIS — G8929 Other chronic pain: Secondary | ICD-10-CM | POA: Diagnosis not present

## 2021-10-14 DIAGNOSIS — M62830 Muscle spasm of back: Secondary | ICD-10-CM | POA: Diagnosis not present

## 2021-10-14 DIAGNOSIS — R2689 Other abnormalities of gait and mobility: Secondary | ICD-10-CM

## 2021-10-14 DIAGNOSIS — M6281 Muscle weakness (generalized): Secondary | ICD-10-CM

## 2021-10-14 DIAGNOSIS — R262 Difficulty in walking, not elsewhere classified: Secondary | ICD-10-CM

## 2021-10-14 NOTE — Therapy (Signed)
OUTPATIENT PHYSICAL THERAPY TREATMENT NOTE   Patient Name: Maria Marquez MRN: [id number] DOB:September 10, 1945, 76 y.o., female Today's Date: 10/14/2021  PCP: [name], Rayford Halsted, MD REFERRING PROVIDER: [name], Estel*   PT End of Session - 10/14/21 1230     Visit Number 3    Date for PT Re-Evaluation 11/20/21    Authorization Type [employer]    Authorization Time Period 09/30/21 to 11/20/21    Authorization - Visit Number 3    Authorization - Number of Visits 12    Progress Note Due on Visit 10    PT Start Time 1228    PT Stop Time 1310    PT Time Calculation (min) 42 min    Activity Tolerance Patient tolerated treatment well    Behavior During Therapy [facility] for tasks assessed/performed             Past Medical History:  Diagnosis Date   Anemia associated with chemotherapy    followed by dr Alen Blew   Bilateral lower extremity edema    per pt occasionally ankles swell   Carcinoma of renal pelvis, right [facility])    urologist-- dr winter/  oncologist-- dr Alen Blew;   dx 08/ 2021 ;  chemo 10/ 2021  to 12/ 2021   Chronic kidney disease, stage 3a ([place]) 12/31/2019   Dyspnea    12-26-2020  per pt no issue most of the time   Family history of breast cancer 05/04/2021   Family history of ovarian cancer 05/04/2021   GERD (gastroesophageal reflux disease)    per pt occasionally takes tums   History of basal cell carcinoma (BCC) excision    per pt in 1970s on nose   History of chemotherapy    06-13-2020  to 12/ 2021  for right renal pelvis carcinoma   History of colon polyps    History of radiation therapy    11-13-2020 to 11-26-2020---   Right Lung- SBRT- Dr. Gery Pray   HLD (hyperlipidemia)    Hypertension    followed by pcp   Hypothyroidism, postsurgical 1979   followed by pcp   Leukocytosis    Nocturia    OA (osteoarthritis)    PONV (postoperative nausea and vomiting)    Since chemo (always nauseous)   Pre-diabetes    Primary adenocarcinoma of upper lobe  of right lung [facility])    oncologist--- shadad/  pulmonology-- dr byrum;  dx 02/ 2022;  s/p  SBRT 11-13-2020 to 11-26-2020   Urgency of urination    Wears partial dentures    lower   Past Surgical History:  Procedure Laterality Date   COLONOSCOPY  last one 2017   CYSTOSCOPY WITH BIOPSY Right 09/16/2021   Procedure: CYSTOSCOPY / URETEROSCOPY WITH BIOPSY/bugbee fulgeration/right retrograde;  Surgeon: Ceasar Mons, MD;  Location: [facility];  Service: Urology;  Laterality: Right;   CYSTOSCOPY WITH URETEROSCOPY Right 04/18/2020   Procedure: CYSTOSCOPY WITH URETEROSCOPY, BIOPSY;  Surgeon: Ceasar Mons, MD;  [facility];  Service: Urology;  Laterality: Right;  ONLY NEEDS 45 MIN   CYSTOSCOPY WITH URETEROSCOPY AND STENT PLACEMENT N/A 12/31/2020   Procedure: CYSTOSCOPY WITH RIGHT URETEROSCOPY/ BILATERAL RETROGRADE PYELOGRAM/RIGHT URETERAL BIOPSY/ LASER ABLATION/RIGHT STENT PLACEMENT;  Surgeon: Ceasar Mons, MD;  Location[facility];  Service: Urology;  Laterality: N/A;   CYSTOSCOPY/RETROGRADE/URETEROSCOPY Right 04/22/2021   Procedure: CYSTOSCOPY/RETROGRADE/URETEROSCOPY/ STENT PLACEMENT;  Surgeon: Ceasar Mons, MD;  Location: [facility];  Service: Urology;  Laterality: Right;   EYE  SURGERY Bilateral 1987   tear ducts   IR IMAGING GUIDED PORT INSERTION  06/04/2020   THYROID LOBECTOMY Right 1979   TOTAL HIP ARTHROPLASTY Right 01/28/2016   Procedure: RIGHT TOTAL HIP ARTHROPLASTY ANTERIOR APPROACH;  Surgeon: [name], MD;  [facility]: [facility];  Service: Orthopedics;  Laterality: Right;   UMBILICAL HERNIA REPAIR  1980s   VIDEO BRONCHOSCOPY WITH ENDOBRONCHIAL NAVIGATION N/A 06/06/2020   Procedure: VIDEO BRONCHOSCOPY WITH ENDOBRONCHIAL NAVIGATION;  Surgeon: Collene Gobble, MD;  Location: [place];  Service: Thoracic;  Laterality: N/A;   VIDEO BRONCHOSCOPY WITH ENDOBRONCHIAL NAVIGATION N/A 10/10/2020   Procedure:  VIDEO BRONCHOSCOPY WITH ENDOBRONCHIAL NAVIGATION;  Surgeon: [name], MD;  Location: [place];  Service: Thoracic;  Laterality: N/A;   Patient Active Problem List   Diagnosis Date Noted   Genetic testing 06/02/2021   Family history of breast cancer 05/04/2021   Family history of ovarian cancer 05/04/2021   Family history of colon cancer 05/04/2021   Vitamin D deficiency 03/20/2021   Primary cancer of right upper lobe of lung ([place]) 10/21/2020   Port-A-Cath in place 06/13/2020   Pulmonary nodule 06/03/2020   Cancer of renal pelvis, unspecified laterality ([place]) 05/26/2020   Chronic kidney disease, stage 3a ([place]) 12/31/2019   Prediabetes 12/31/2019   Obesity (BMI 30-39.9) 12/31/2019   Major depression, recurrent, chronic ([place]) 10/24/2019   Leg edema 10/24/2019   Medication intolerance 10/24/2019   Benign microscopic hematuria 09/17/2019   Myalgia due to statin 95/62/1308   Umbilical hernia 65/78/4696   Sessile colonic polyp 10/04/2017   Tobacco dependence 12/09/2015   Status post right hip replacement 07/21/2015   ACE inhibitor intolerance 06/17/2015   DJD (degenerative joint disease), lumbar 06/17/2015   Essential hypertension 06/17/2015   Mixed hyperlipidemia 06/17/2015   Morbid obesity ([place]) 06/17/2015   Post-menopause on HRT (hormone replacement therapy) 06/17/2015   Postoperative hypothyroidism 06/17/2015   Primary osteoarthritis involving multiple joints 06/17/2015    REFERRING DIAG: Chronic bilateral low back pain without sciatica  THERAPY DIAG:  Difficulty in walking, not elsewhere classified  Balance problem  Muscle weakness (generalized)  Chronic low back pain, unspecified back pain laterality, unspecified whether sciatica present  Muscle spasm of back  PERTINENT HISTORY: HTN, Stage IIIa Chronic Kidney Disease, High-grade urothelial cancer and lung cancer  PRECAUTIONS: none  SUBJECTIVE: Pt reports that that she has been more compliant with HEP.  She  states that she is now able to stand for 10 minutes before she starts having pain.  PAIN:  Are you having pain? Yes NPRS scale: 6/10 Pain location: lumbar Pain orientation: Lower  PAIN TYPE: aching and tight Pain description: intermittent  Aggravating factors: standing Relieving factors: rest   OBJECTIVE:    DIAGNOSTIC FINDINGS:  CT scans for cancer in the future   PATIENT SURVEYS:  Modified Oswestry 28 / 50 = 56.0 %  FOTO 48%   SCREENING FOR RED FLAGS: Bowel or bladder incontinence: Yes: pt reports having a little bit of bladder incontinence and is working with Marketing executive. Spinal tumors: No Cauda equina syndrome: No Compression fracture: No Abdominal aneurysm: No   COGNITION:          Overall cognitive status: Within functional limits for tasks assessed                        SENSATION:          Light touch: Appears intact  MUSCLE LENGTH: Hamstrings: Right 55 deg; Left 55 deg   POSTURE:  Pt with decreased lumbar lordosis with forward flexed posture.   PALPATION: Tender to palpation along lumbar paraspinals with spasms noted.   LUMBARAROM/PROM   A/PROM A/PROM  09/30/2021  Flexion 50  Extension 30  Right lateral flexion    Left lateral flexion    Right rotation    Left rotation     (Blank rows = not tested)   LE AROM/PROM:                     WFL     LE MMT:   BLE strength grossly 4 to 4+/5 throughout.   LUMBAR SPECIAL TESTS:  Straight leg raise test: Positive   FUNCTIONAL TESTS:   10/14/21 BERG 43/56  09/30/2021 5 times sit to stand: 14.9 sec with limited UE use pushing up from thighs. Timed up and go (TUG): 11.8 sec   GAIT: Distance walked: 150 ft Assistive device utilized: None Level of assistance: Complete Independence Comments: antalgic gait pattern noted.       TODAY'S TREATMENT    10/14/2021: Nustep L4 x6 min with PT present to discuss status Supine hamstring stretch with strap 2 x 20 sec B Hooklying piriformis  stretch 2 x 20 sec B Bridging 2x10 Seated with 2# ankle weights: LAQ, marching, hip abduction scissors. Sit to/from stand x10 reps Physioball rollout with green pball 5 sec x5 reps  10/07/2021: Review of initial HEP. Pt required full re-education and performance of all exercises. Seated piriformis stretch modified to semi-reclined which was easier for pt to do. Transverse abdominis 10x both seated and supine, TA clamshell 10x, Lower trunk rotation 10x, added this to HEP  09/30/2021 Evaluation Seated piriformis stretch x20 sec hold B Seated hamstring stretch x20 sec hold B Transversus Abdominus set x10 sec     PATIENT EDUCATION:  Education details: Pt educated in [place]. Person educated: Patient Education method: Explanation, Demonstration, and Handouts Education comprehension: verbalized understanding and returned demonstration     HOME EXERCISE PROGRAM: Access Code: [email or web address] URL: [email or web address] Date: 10/14/2021 Prepared by: Shelby Dubin Demetri Goshert  Exercises Seated Transversus Abdominis Bracing - 1-2 x daily - 7 x weekly - 2 sets - 10 reps - 2-5 sec hold Supine Piriformis Stretch with Foot on Ground - 2 x daily - 7 x weekly - 3 reps - 20 hold Seated Hamstring Stretch - 2 x daily - 7 x weekly - 3 reps - 20 hold Supine Lower Trunk Rotation - 2 x daily - 7 x weekly - 10 reps Supine Transversus Abdominis Bracing - Hands on Stomach - 2 x daily - 7 x weekly - 10 reps - 5 hold      ASSESSMENT:   CLINICAL IMPRESSION: Ms Sokol presents to PT today and is agreeable to care.  Following hamstring stretch, she reports that it feels better. Upon completion of PT session, pt with increased hip rotation noted with ambulation and pt reports feeling looser.  Pt denies pain in her back at completion of treatment.  Pt continues to require skilled PT to progress towards goal related activities and decreased pain with mobility.  Objective impairments include decreased balance, decreased  endurance, difficulty walking, decreased strength, increased muscle spasms, impaired flexibility, postural dysfunction, and pain. These impairments are limiting patient from cleaning and community activity.  Personal factors including Age and 1-2 comorbidities: Hx of cancer, R THA  are also affecting patient's functional outcome.    REHAB POTENTIAL: Good  CLINICAL DECISION MAKING: Evolving/moderate complexity   EVALUATION COMPLEXITY: Moderate     GOALS: Goals reviewed with patient? Yes   SHORT TERM GOALS:   STG Name Target Date Goal status  1 Pt will be independent with initial HEP. Baseline:  10/14/2021 Ongoing  2 Pt will undergo BERG balance test to further assess balance. Baseline: 43/56 on 10/14/21 10/14/2021 Met    LONG TERM GOALS:    LTG Name Target Date Goal status  1 Pt will be independent with advanced HEP. Baseline: 11/25/2021 INITIAL  2 Pt will increase FOTO to at least 53% to demonstrate improved functional mobility. Baseline:  48% 11/25/2021 INITIAL  3 Pt will decrease modified Oswestry to at least 45% to demonstrate decreased pain. Baseline: 56% 11/25/2021 INITIAL  4 Pt will have a BERG score of at least 48/56 to place her at a low risk of falling. Baseline: 11/25/2021 INITIAL    PLAN: PT FREQUENCY: 2x/week   PT DURATION: 8 weeks   PLANNED INTERVENTIONS: Therapeutic exercises, Therapeutic activity, Neuro Muscular re-education, Balance training, Gait training, Patient/Family education, Joint mobilization, Stair training, Aquatic Therapy, Dry Needling, Electrical stimulation, Cryotherapy, Moist heat, Taping, Traction, Ionotophoresis 20m/ml Dexamethasone, and Manual therapy   PLAN FOR NEXT SESSION: assess and progress HEP as indicated, strengthening, core stability    SJuel Burrow PT 10/14/2021, 12:47 PM  [facility] [place] [address]Phone # [phone] [phone]

## 2021-10-16 ENCOUNTER — Other Ambulatory Visit: Payer: Self-pay

## 2021-10-16 ENCOUNTER — Ambulatory Visit: Payer: Medicare HMO | Admitting: Physical Therapy

## 2021-10-16 DIAGNOSIS — M6281 Muscle weakness (generalized): Secondary | ICD-10-CM

## 2021-10-16 DIAGNOSIS — R2689 Other abnormalities of gait and mobility: Secondary | ICD-10-CM | POA: Diagnosis not present

## 2021-10-16 DIAGNOSIS — G8929 Other chronic pain: Secondary | ICD-10-CM | POA: Diagnosis not present

## 2021-10-16 DIAGNOSIS — R262 Difficulty in walking, not elsewhere classified: Secondary | ICD-10-CM | POA: Diagnosis not present

## 2021-10-16 DIAGNOSIS — M62830 Muscle spasm of back: Secondary | ICD-10-CM | POA: Diagnosis not present

## 2021-10-16 NOTE — Therapy (Signed)
OUTPATIENT PHYSICAL THERAPY TREATMENT NOTE   Patient Name: Wynonia Medero MRN: [id number] DOB:20-Dec-1945, 76 y.o., female Today's Date: 10/16/2021  PCP: [name], Rayford Halsted, MD REFERRING PROVIDER: [name], Estel*   PT End of Session - 10/16/21 1107     Visit Number 4    Number of Visits 12    Date for PT Re-Evaluation 11/20/21    Authorization Type [employer] 12 visits    Authorization - Visit Number 4    Authorization - Number of Visits 12    Progress Note Due on Visit 10    PT Start Time 1100    PT Stop Time 1144    PT Time Calculation (min) 44 min    Activity Tolerance Patient tolerated treatment well              Past Medical History:  Diagnosis Date   Anemia associated with chemotherapy    followed by dr Alen Blew   Bilateral lower extremity edema    per pt occasionally ankles swell   Carcinoma of renal pelvis, right [facility])    urologist-- dr winter/  oncologist-- dr Alen Blew;   dx 08/ 2021 ;  chemo 10/ 2021  to 12/ 2021   Chronic kidney disease, stage 3a ([place]) 12/31/2019   Dyspnea    12-26-2020  per pt no issue most of the time   Family history of breast cancer 05/04/2021   Family history of ovarian cancer 05/04/2021   GERD (gastroesophageal reflux disease)    per pt occasionally takes tums   History of basal cell carcinoma (BCC) excision    per pt in 1970s on nose   History of chemotherapy    06-13-2020  to 12/ 2021  for right renal pelvis carcinoma   History of colon polyps    History of radiation therapy    11-13-2020 to 11-26-2020---   Right Lung- SBRT- Dr. Gery Pray   HLD (hyperlipidemia)    Hypertension    followed by pcp   Hypothyroidism, postsurgical 1979   followed by pcp   Leukocytosis    Nocturia    OA (osteoarthritis)    PONV (postoperative nausea and vomiting)    Since chemo (always nauseous)   Pre-diabetes    Primary adenocarcinoma of upper lobe of right lung [facility])    oncologist--- shadad/  pulmonology-- dr byrum;   dx 02/ 2022;  s/p  SBRT 11-13-2020 to 11-26-2020   Urgency of urination    Wears partial dentures    lower   Past Surgical History:  Procedure Laterality Date   COLONOSCOPY  last one 2017   CYSTOSCOPY WITH BIOPSY Right 09/16/2021   Procedure: CYSTOSCOPY / URETEROSCOPY WITH BIOPSY/bugbee fulgeration/right retrograde;  Surgeon: Ceasar Mons, MD;  [facility];  Service: Urology;  Laterality: Right;   CYSTOSCOPY WITH URETEROSCOPY Right 04/18/2020   Procedure: CYSTOSCOPY WITH URETEROSCOPY, BIOPSY;  Surgeon: Ceasar Mons, MD;  [facility];  Service: Urology;  Laterality: Right;  ONLY NEEDS 45 MIN   CYSTOSCOPY WITH URETEROSCOPY AND STENT PLACEMENT N/A 12/31/2020   Procedure: CYSTOSCOPY WITH RIGHT URETEROSCOPY/ BILATERAL RETROGRADE PYELOGRAM/RIGHT URETERAL BIOPSY/ LASER ABLATION/RIGHT STENT PLACEMENT;  Surgeon: Ceasar Mons, MD;  Location: [facility];  Service: Urology;  Laterality: N/A;   CYSTOSCOPY/RETROGRADE/URETEROSCOPY Right 04/22/2021   Procedure: CYSTOSCOPY/RETROGRADE/URETEROSCOPY/ STENT PLACEMENT;  Surgeon: Ceasar Mons, MD;  Location: [facility];  Service: Urology;  Laterality: Right;   EYE SURGERY Bilateral 1987   tear ducts  IR IMAGING GUIDED PORT INSERTION  06/04/2020   THYROID LOBECTOMY Right 1979   TOTAL HIP ARTHROPLASTY Right 01/28/2016   Procedure: RIGHT TOTAL HIP ARTHROPLASTY ANTERIOR APPROACH;  Surgeon: [name], MD;  [facility];  Service: Orthopedics;  Laterality: Right;   UMBILICAL HERNIA REPAIR  1980s   VIDEO BRONCHOSCOPY WITH ENDOBRONCHIAL NAVIGATION N/A 06/06/2020   Procedure: VIDEO BRONCHOSCOPY WITH ENDOBRONCHIAL NAVIGATION;  Surgeon: Collene Gobble, MD;  Location: [place];  Service: Thoracic;  Laterality: N/A;   VIDEO BRONCHOSCOPY WITH ENDOBRONCHIAL NAVIGATION N/A 10/10/2020   Procedure: VIDEO BRONCHOSCOPY WITH ENDOBRONCHIAL NAVIGATION;  Surgeon:  [name], MD;  Location: [place];  Service: Thoracic;  Laterality: N/A;   Patient Active Problem List   Diagnosis Date Noted   Genetic testing 06/02/2021   Family history of breast cancer 05/04/2021   Family history of ovarian cancer 05/04/2021   Family history of colon cancer 05/04/2021   Vitamin D deficiency 03/20/2021   Primary cancer of right upper lobe of lung ([place]) 10/21/2020   Port-A-Cath in place 06/13/2020   Pulmonary nodule 06/03/2020   Cancer of renal pelvis, unspecified laterality ([place]) 05/26/2020   Chronic kidney disease, stage 3a ([place]) 12/31/2019   Prediabetes 12/31/2019   Obesity (BMI 30-39.9) 12/31/2019   Major depression, recurrent, chronic ([place]) 10/24/2019   Leg edema 10/24/2019   Medication intolerance 10/24/2019   Benign microscopic hematuria 09/17/2019   Myalgia due to statin 88/89/1694   Umbilical hernia 50/38/8828   Sessile colonic polyp 10/04/2017   Tobacco dependence 12/09/2015   Status post right hip replacement 07/21/2015   ACE inhibitor intolerance 06/17/2015   DJD (degenerative joint disease), lumbar 06/17/2015   Essential hypertension 06/17/2015   Mixed hyperlipidemia 06/17/2015   Morbid obesity ([place]) 06/17/2015   Post-menopause on HRT (hormone replacement therapy) 06/17/2015   Postoperative hypothyroidism 06/17/2015   Primary osteoarthritis involving multiple joints 06/17/2015    REFERRING DIAG: Chronic bilateral low back pain without sciatica  THERAPY DIAG:  Difficulty in walking, not elsewhere classified  Balance problem  Muscle weakness (generalized)  PERTINENT HISTORY: HTN, Stage IIIa Chronic Kidney Disease, High-grade urothelial cancer and lung cancer  PRECAUTIONS: none  SUBJECTIVE: My knees have been achy since last visit.  Quad and HS soreness too so I don't think I should do any ex's using my knees today.    PAIN:  Are you having pain? Yes NPRS scale: 6/10 Pain location: lumbar Pain orientation: Lower  PAIN TYPE:  aching and tight Pain description: intermittent  Aggravating factors: standing Relieving factors: rest   OBJECTIVE:    DIAGNOSTIC FINDINGS:  CT scans for cancer in the future   PATIENT SURVEYS:  Modified Oswestry 28 / 50 = 56.0 %  FOTO 48%   SCREENING FOR RED FLAGS: Bowel or bladder incontinence: Yes: pt reports having a little bit of bladder incontinence and is working with Marketing executive. Spinal tumors: No Cauda equina syndrome: No Compression fracture: No Abdominal aneurysm: No   COGNITION:          Overall cognitive status: Within functional limits for tasks assessed                        SENSATION:          Light touch: Appears intact             MUSCLE LENGTH: Hamstrings: Right 55 deg; Left 55 deg   POSTURE:  Pt with decreased lumbar lordosis with forward flexed posture.  PALPATION: Tender to palpation along lumbar paraspinals with spasms noted.   LUMBARAROM/PROM   A/PROM A/PROM  09/30/2021  Flexion 50  Extension 30  Right lateral flexion    Left lateral flexion    Right rotation    Left rotation     (Blank rows = not tested)   LE AROM/PROM:                     WFL     LE MMT:   BLE strength grossly 4 to 4+/5 throughout.   LUMBAR SPECIAL TESTS:  Straight leg raise test: Positive   FUNCTIONAL TESTS:   10/14/21 BERG 43/56  09/30/2021 5 times sit to stand: 14.9 sec with limited UE use pushing up from thighs. Timed up and go (TUG): 11.8 sec   GAIT: Distance walked: 150 ft Assistive device utilized: None Level of assistance: Complete Independence Comments: antalgic gait pattern noted.    TODAY'S TREATMENT    10/16/2021: Nustep L4 x6 min with PT present to discuss status Hand to knee isometric push 5x each side Supine clams green band 20x  Supine green band horizontal abduction 15x Seated core series holding 5# weight: hip to hip, shoulder to hip, shoulder hip shoulder 5x each Standing green band rows and extensions 15x each   TODAY'S  TREATMENT    10/14/2021: Nustep L4 x6 min with PT present to discuss status Supine hamstring stretch with strap 2 x 20 sec B Hooklying piriformis stretch 2 x 20 sec B Bridging 2x10 Seated with 2# ankle weights: LAQ, marching, hip abduction scissors. Sit to/from stand x10 reps Physioball rollout with green pball 5 sec x5 reps  10/07/2021: Review of initial HEP. Pt required full re-education and performance of all exercises. Seated piriformis stretch modified to semi-reclined which was easier for pt to do. Transverse abdominis 10x both seated and supine, TA clamshell 10x, Lower trunk rotation 10x, added this to HEP  09/30/2021 Evaluation Seated piriformis stretch x20 sec hold B Seated hamstring stretch x20 sec hold B Transversus Abdominus set x10 sec     PATIENT EDUCATION:  Education details: Pt educated in [place]. Person educated: Patient Education method: Explanation, Demonstration, and Handouts Education comprehension: verbalized understanding and returned demonstration         Access Code: [email or web address] URL: [email or web address] Date: 10/16/2021 Prepared by: Ruben Im  Exercises Seated Transversus Abdominis Bracing - 1-2 x daily - 7 x weekly - 2 sets - 10 reps - 2-5 sec hold Supine Piriformis Stretch with Foot on Ground - 2 x daily - 7 x weekly - 3 reps - 20 hold Seated Hamstring Stretch - 2 x daily - 7 x weekly - 3 reps - 20 hold Supine Lower Trunk Rotation - 2 x daily - 7 x weekly - 10 reps Supine Transversus Abdominis Bracing - Hands on Stomach - 2 x daily - 7 x weekly - 10 reps - 5 hold Hooklying Clamshell with Resistance - 1 x daily - 7 x weekly - 1 sets - 10 reps Supine Shoulder Horizontal Abduction with Resistance - 1 x daily - 7 x weekly - 1 sets - 10 reps Standing Row with Anchored Resistance - 1 x daily - 7 x weekly - 1 sets - 10 reps Standing Shoulder Extension with Resistance - 1 x daily - 7 x weekly - 1 sets - 10 reps  ASSESSMENT:   CLINICAL  IMPRESSION: The patient reports post ex soreness and bil knee pain today so treatment modified to focus  on lumbo/pelvic/hip strengthening ex's.  Verbal cues to activate transverse abdominus muscles and avoid holding her breath with ex.  Good response to all ex's, without complaint of knee or back pain.     Objective impairments include decreased balance, decreased endurance, difficulty walking, decreased strength, increased muscle spasms, impaired flexibility, postural dysfunction, and pain. These impairments are limiting patient from cleaning and community activity.  Personal factors including Age and 1-2 comorbidities: Hx of cancer, R THA  are also affecting patient's functional outcome.    REHAB POTENTIAL: Good   CLINICAL DECISION MAKING: Evolving/moderate complexity   EVALUATION COMPLEXITY: Moderate     GOALS: Goals reviewed with patient? Yes   SHORT TERM GOALS:   STG Name Target Date Goal status  1 Pt will be independent with initial HEP. Baseline:  10/14/2021 Ongoing  2 Pt will undergo BERG balance test to further assess balance. Baseline: 43/56 on 10/14/21 10/14/2021 Met    LONG TERM GOALS:    LTG Name Target Date Goal status  1 Pt will be independent with advanced HEP. Baseline: 11/25/2021 INITIAL  2 Pt will increase FOTO to at least 53% to demonstrate improved functional mobility. Baseline:  48% 11/25/2021 INITIAL  3 Pt will decrease modified Oswestry to at least 45% to demonstrate decreased pain. Baseline: 56% 11/25/2021 INITIAL  4 Pt will have a BERG score of at least 48/56 to place her at a low risk of falling. Baseline: 11/25/2021 INITIAL    PLAN: PT FREQUENCY: 2x/week   PT DURATION: 8 weeks   PLANNED INTERVENTIONS: Therapeutic exercises, Therapeutic activity, Neuro Muscular re-education, Balance training, Gait training, Patient/Family education, Joint mobilization, Stair training, Aquatic Therapy, Dry Needling, Electrical stimulation, Cryotherapy, Moist heat, Taping,  Traction, Ionotophoresis 51m/ml Dexamethasone, and Manual therapy   PLAN FOR NEXT SESSION: assess and progress HEP as indicated, strengthening, core stability   SRuben Im PT 10/16/21 11:45 AM Phone: [phone]: [phone] SAlvera Singh PT 10/16/2021, 11:45 AM  [facility] [place] [place] [address]Phone # [phone] [phone]

## 2021-10-20 ENCOUNTER — Other Ambulatory Visit: Payer: Self-pay

## 2021-10-20 ENCOUNTER — Encounter: Payer: Self-pay | Admitting: Rehabilitative and Restorative Service Providers"

## 2021-10-20 ENCOUNTER — Ambulatory Visit: Payer: Medicare HMO | Admitting: Rehabilitative and Restorative Service Providers"

## 2021-10-20 DIAGNOSIS — R2689 Other abnormalities of gait and mobility: Secondary | ICD-10-CM

## 2021-10-20 DIAGNOSIS — M6281 Muscle weakness (generalized): Secondary | ICD-10-CM | POA: Diagnosis not present

## 2021-10-20 DIAGNOSIS — G8929 Other chronic pain: Secondary | ICD-10-CM | POA: Diagnosis not present

## 2021-10-20 DIAGNOSIS — M62830 Muscle spasm of back: Secondary | ICD-10-CM

## 2021-10-20 DIAGNOSIS — R262 Difficulty in walking, not elsewhere classified: Secondary | ICD-10-CM | POA: Diagnosis not present

## 2021-10-20 DIAGNOSIS — M545 Low back pain, unspecified: Secondary | ICD-10-CM

## 2021-10-20 NOTE — Therapy (Signed)
OUTPATIENT PHYSICAL THERAPY TREATMENT NOTE   Patient Name: Maria Marquez MRN: [id number] DOB:August 10, 1946, 76 y.o., female Today's Date: 10/20/2021  PCP: [name], Rayford Halsted, MD REFERRING PROVIDER: [name], Estel*   PT End of Session - 10/20/21 1103     Visit Number 5    Number of Visits 12    Date for PT Re-Evaluation 11/20/21    Authorization Type [employer] 12 visits    Authorization Time Period 09/30/21 to 11/20/21    Authorization - Visit Number 5    Authorization - Number of Visits 12    Progress Note Due on Visit 10    PT Start Time 1100    PT Stop Time 1140    PT Time Calculation (min) 40 min    Activity Tolerance Patient tolerated treatment well    Behavior During Therapy [facility] for tasks assessed/performed              Past Medical History:  Diagnosis Date   Anemia associated with chemotherapy    followed by dr Alen Blew   Bilateral lower extremity edema    per pt occasionally ankles swell   Carcinoma of renal pelvis, right [facility])    urologist-- dr winter/  oncologist-- dr Alen Blew;   dx 08/ 2021 ;  chemo 10/ 2021  to 12/ 2021   Chronic kidney disease, stage 3a ([place]) 12/31/2019   Dyspnea    12-26-2020  per pt no issue most of the time   Family history of breast cancer 05/04/2021   Family history of ovarian cancer 05/04/2021   GERD (gastroesophageal reflux disease)    per pt occasionally takes tums   History of basal cell carcinoma (BCC) excision    per pt in 1970s on nose   History of chemotherapy    06-13-2020  to 12/ 2021  for right renal pelvis carcinoma   History of colon polyps    History of radiation therapy    11-13-2020 to 11-26-2020---   Right Lung- SBRT- Dr. Gery Pray   HLD (hyperlipidemia)    Hypertension    followed by pcp   Hypothyroidism, postsurgical 1979   followed by pcp   Leukocytosis    Nocturia    OA (osteoarthritis)    PONV (postoperative nausea and vomiting)    Since chemo (always nauseous)   Pre-diabetes     Primary adenocarcinoma of upper lobe of right lung [facility])    oncologist--- shadad/  pulmonology-- dr byrum;  dx 02/ 2022;  s/p  SBRT 11-13-2020 to 11-26-2020   Urgency of urination    Wears partial dentures    lower   Past Surgical History:  Procedure Laterality Date   COLONOSCOPY  last one 2017   CYSTOSCOPY WITH BIOPSY Right 09/16/2021   Procedure: CYSTOSCOPY / URETEROSCOPY WITH BIOPSY/bugbee fulgeration/right retrograde;  Surgeon: Ceasar Mons, MD;  Location: [facility];  Service: Urology;  Laterality: Right;   CYSTOSCOPY WITH URETEROSCOPY Right 04/18/2020   Procedure: CYSTOSCOPY WITH URETEROSCOPY, BIOPSY;  Surgeon: Ceasar Mons, MD;  Location: [facility];  Service: Urology;  Laterality: Right;  ONLY NEEDS 45 MIN   CYSTOSCOPY WITH URETEROSCOPY AND STENT PLACEMENT N/A 12/31/2020   Procedure: CYSTOSCOPY WITH RIGHT URETEROSCOPY/ BILATERAL RETROGRADE PYELOGRAM/RIGHT URETERAL BIOPSY/ LASER ABLATION/RIGHT STENT PLACEMENT;  Surgeon: Ceasar Mons, MD;  Location: [facility];  Service: Urology;  Laterality: N/A;   CYSTOSCOPY/RETROGRADE/URETEROSCOPY Right 04/22/2021   Procedure: CYSTOSCOPY/RETROGRADE/URETEROSCOPY/ STENT PLACEMENT;  Surgeon: Ceasar Mons, MD;  Location: [facility];  Service: Urology;  Laterality: Right;   EYE SURGERY Bilateral 1987   tear ducts   IR IMAGING GUIDED PORT INSERTION  06/04/2020   THYROID LOBECTOMY Right 1979   TOTAL HIP ARTHROPLASTY Right 01/28/2016   Procedure: RIGHT TOTAL HIP ARTHROPLASTY ANTERIOR APPROACH;  Surgeon: [name], MD;  Location: [facility];  Service: Orthopedics;  Laterality: Right;   UMBILICAL HERNIA REPAIR  1980s   VIDEO BRONCHOSCOPY WITH ENDOBRONCHIAL NAVIGATION N/A 06/06/2020   Procedure: VIDEO BRONCHOSCOPY WITH ENDOBRONCHIAL NAVIGATION;  Surgeon: Collene Gobble, MD;  Location: [place];  Service: Thoracic;  Laterality: N/A;   VIDEO BRONCHOSCOPY WITH ENDOBRONCHIAL  NAVIGATION N/A 10/10/2020   Procedure: VIDEO BRONCHOSCOPY WITH ENDOBRONCHIAL NAVIGATION;  Surgeon: [name], MD;  Location: [place];  Service: Thoracic;  Laterality: N/A;   Patient Active Problem List   Diagnosis Date Noted   Genetic testing 06/02/2021   Family history of breast cancer 05/04/2021   Family history of ovarian cancer 05/04/2021   Family history of colon cancer 05/04/2021   Vitamin D deficiency 03/20/2021   Primary cancer of right upper lobe of lung ([place]) 10/21/2020   Port-A-Cath in place 06/13/2020   Pulmonary nodule 06/03/2020   Cancer of renal pelvis, unspecified laterality ([place]) 05/26/2020   Chronic kidney disease, stage 3a ([place]) 12/31/2019   Prediabetes 12/31/2019   Obesity (BMI 30-39.9) 12/31/2019   Major depression, recurrent, chronic ([place]) 10/24/2019   Leg edema 10/24/2019   Medication intolerance 10/24/2019   Benign microscopic hematuria 09/17/2019   Myalgia due to statin 04/05/4817   Umbilical hernia 56/31/4970   Sessile colonic polyp 10/04/2017   Tobacco dependence 12/09/2015   Status post right hip replacement 07/21/2015   ACE inhibitor intolerance 06/17/2015   DJD (degenerative joint disease), lumbar 06/17/2015   Essential hypertension 06/17/2015   Mixed hyperlipidemia 06/17/2015   Morbid obesity ([place]) 06/17/2015   Post-menopause on HRT (hormone replacement therapy) 06/17/2015   Postoperative hypothyroidism 06/17/2015   Primary osteoarthritis involving multiple joints 06/17/2015    REFERRING DIAG: Chronic bilateral low back pain without sciatica  THERAPY DIAG:  Difficulty in walking, not elsewhere classified  Balance problem  Muscle weakness (generalized)  Chronic low back pain, unspecified back pain laterality, unspecified whether sciatica present  Muscle spasm of back  PERTINENT HISTORY: HTN, Stage IIIa Chronic Kidney Disease, High-grade urothelial cancer and lung cancer  PRECAUTIONS: none  SUBJECTIVE: Pt reports that her pain  has centralized more and is not as difuse.   PAIN:  Are you having pain? Yes NPRS scale: 6/10 when standing; 0/10 in supine/sitting Pain location: lumbar Pain orientation: Lower  PAIN TYPE: aching and tight Pain description: intermittent  Aggravating factors: standing Relieving factors: rest   OBJECTIVE:    PATIENT SURVEYS:  Modified Oswestry 28 / 50 = 56.0 %  FOTO 48%   LUMBARAROM/PROM   A/PROM A/PROM  09/30/2021  Flexion 50  Extension 30  Right lateral flexion    Left lateral flexion    Right rotation    Left rotation     (Blank rows = not tested)  LE MMT:   BLE strength grossly 4 to 4+/5 throughout.   LUMBAR SPECIAL TESTS:  Straight leg raise test: Positive   FUNCTIONAL TESTS:   10/14/21 BERG 43/56  09/30/2021 5 times sit to stand: 14.9 sec with limited UE use pushing up from thighs. Timed up and go (TUG): 11.8 sec   GAIT: Distance walked: 150 ft Assistive device utilized: None Level of assistance: Complete Independence Comments: antalgic  gait pattern noted.    TODAY'S TREATMENT    10/20/2021: Nustep L4 x6 min with PT present to discuss status Seated core series holding 5# weight: hip to hip, shoulder to hip, shoulder to shoulder 5x each Sit to/from stand seated on blue foam holding 5# x10 reps Supine hamstring stretch with strap 2 x 20 sec B Supine clams green band 2x10  Supine green band horizontal abduction 15x Practice log roll with supine to/from sit x3 reps with cuing for technique Seated green band rows and extensions 2x10 each with cuing for maintaining core stability 3 way Physioball rollout with green pball 5 sec x5 reps each Hand to knee isometric push 5x each side  10/16/2021: Nustep L4 x6 min with PT present to discuss status Hand to knee isometric push 5x each side Supine clams green band 20x  Supine green band horizontal abduction 15x Seated core series holding 5# weight: hip to hip, shoulder to hip, shoulder hip shoulder 5x  each Standing green band rows and extensions 15x each   10/14/2021: Nustep L4 x6 min with PT present to discuss status Supine hamstring stretch with strap 2 x 20 sec B Hooklying piriformis stretch 2 x 20 sec B Bridging 2x10 Seated with 2# ankle weights: LAQ, marching, hip abduction scissors. Sit to/from stand x10 reps Physioball rollout with green pball 5 sec x5 reps     PATIENT EDUCATION:  Education details: Pt educated in [place]. Person educated: Patient Education method: Explanation, Demonstration, and Handouts Education comprehension: verbalized understanding and returned demonstration  HOME EXERCISE PROGRAM  Access Code: [email or web address] URL: [email or web address] Date: 10/16/2021 Prepared by: Ruben Im  Exercises Seated Transversus Abdominis Bracing - 1-2 x daily - 7 x weekly - 2 sets - 10 reps - 2-5 sec hold Supine Piriformis Stretch with Foot on Ground - 2 x daily - 7 x weekly - 3 reps - 20 hold Seated Hamstring Stretch - 2 x daily - 7 x weekly - 3 reps - 20 hold Supine Lower Trunk Rotation - 2 x daily - 7 x weekly - 10 reps Supine Transversus Abdominis Bracing - Hands on Stomach - 2 x daily - 7 x weekly - 10 reps - 5 hold Hooklying Clamshell with Resistance - 1 x daily - 7 x weekly - 1 sets - 10 reps Supine Shoulder Horizontal Abduction with Resistance - 1 x daily - 7 x weekly - 1 sets - 10 reps Standing Row with Anchored Resistance - 1 x daily - 7 x weekly - 1 sets - 10 reps Standing Shoulder Extension with Resistance - 1 x daily - 7 x weekly - 1 sets - 10 reps  ASSESSMENT:   CLINICAL IMPRESSION: Ms Kolk continues to progress towards goal related activities and improved core stability/strength.  Pt able to progress with there ex during session. Pt without complaints of increased pain today during there ex and states that sit to/from stand was better when sitting on elevated surface to take the strain off her knees.    Objective impairments include  decreased balance, decreased endurance, difficulty walking, decreased strength, increased muscle spasms, impaired flexibility, postural dysfunction, and pain. These impairments are limiting patient from cleaning and community activity.  Personal factors including Age and 1-2 comorbidities: Hx of cancer, R THA  are also affecting patient's functional outcome.    REHAB POTENTIAL: Good   CLINICAL DECISION MAKING: Evolving/moderate complexity   EVALUATION COMPLEXITY: Moderate     GOALS: Goals reviewed with patient? Yes   SHORT TERM GOALS:  STG Name Target Date Goal status  1 Pt will be independent with initial HEP. Baseline:  10/14/2021 Met  2 Pt will undergo BERG balance test to further assess balance. Baseline: 43/56 on 10/14/21 10/14/2021 Met    LONG TERM GOALS:    LTG Name Target Date Goal status  1 Pt will be independent with advanced HEP. Baseline: 11/25/2021 Ongoing  2 Pt will increase FOTO to at least 53% to demonstrate improved functional mobility. Baseline:  48% 11/25/2021 INITIAL  3 Pt will decrease modified Oswestry to at least 45% to demonstrate decreased pain. Baseline: 56% 11/25/2021 INITIAL  4 Pt will have a BERG score of at least 48/56 to place her at a low risk of falling. Baseline: 11/25/2021 INITIAL    PLAN: PT FREQUENCY: 2x/week   PT DURATION: 8 weeks   PLANNED INTERVENTIONS: Therapeutic exercises, Therapeutic activity, Neuro Muscular re-education, Balance training, Gait training, Patient/Family education, Joint mobilization, Stair training, Aquatic Therapy, Dry Needling, Electrical stimulation, Cryotherapy, Moist heat, Taping, Traction, Ionotophoresis 54m/ml Dexamethasone, and Manual therapy   PLAN FOR NEXT SESSION: assess and progress HEP as indicated, strengthening, core stability    SJuel Burrow PT 10/20/2021, 12:38 PM  [facility] SWillow StreetGLakewood Club [place] [address]Phone # [phone] [phone]

## 2021-10-21 ENCOUNTER — Telehealth: Payer: Self-pay | Admitting: Oncology

## 2021-10-21 NOTE — Telephone Encounter (Signed)
Scheduled per 02/21 los, patient has been called ands notified of upcoming appointments. ?

## 2021-10-22 ENCOUNTER — Other Ambulatory Visit: Payer: Self-pay | Admitting: Internal Medicine

## 2021-10-22 ENCOUNTER — Encounter: Payer: Self-pay | Admitting: Rehabilitative and Restorative Service Providers"

## 2021-10-22 ENCOUNTER — Other Ambulatory Visit: Payer: Self-pay

## 2021-10-22 ENCOUNTER — Ambulatory Visit: Payer: Medicare HMO | Attending: Internal Medicine | Admitting: Rehabilitative and Restorative Service Providers"

## 2021-10-22 DIAGNOSIS — R262 Difficulty in walking, not elsewhere classified: Secondary | ICD-10-CM | POA: Diagnosis not present

## 2021-10-22 DIAGNOSIS — R2689 Other abnormalities of gait and mobility: Secondary | ICD-10-CM | POA: Diagnosis not present

## 2021-10-22 DIAGNOSIS — M62830 Muscle spasm of back: Secondary | ICD-10-CM | POA: Diagnosis not present

## 2021-10-22 DIAGNOSIS — M6281 Muscle weakness (generalized): Secondary | ICD-10-CM | POA: Diagnosis not present

## 2021-10-22 DIAGNOSIS — G8929 Other chronic pain: Secondary | ICD-10-CM | POA: Insufficient documentation

## 2021-10-22 DIAGNOSIS — M545 Low back pain, unspecified: Secondary | ICD-10-CM | POA: Insufficient documentation

## 2021-10-22 NOTE — Therapy (Signed)
OUTPATIENT PHYSICAL THERAPY TREATMENT NOTE   Patient Name: Maria Marquez MRN: [id number] DOB:[birth year], 76 y.o., female Today's Date: 10/22/2021  PCP: [name], Rayford Halsted, MD REFERRING PROVIDER: [name], Estel*   PT End of Session - 10/22/21 1150     Visit Number 6    Number of Visits 12    Date for PT Re-Evaluation 11/20/21    Authorization Type [employer] 12 visits    Authorization Time Period 09/30/21 to 11/20/21    Authorization - Visit Number 6    Authorization - Number of Visits 12    Progress Note Due on Visit 10    PT Start Time 8938    PT Stop Time 1225    PT Time Calculation (min) 40 min    Activity Tolerance Patient tolerated treatment well    Behavior During Therapy [facility] for tasks assessed/performed              Past Medical History:  Diagnosis Date   Anemia associated with chemotherapy    followed by dr Alen Blew   Bilateral lower extremity edema    per pt occasionally ankles swell   Carcinoma of renal pelvis, right [facility])    urologist-- dr winter/  oncologist-- dr Alen Blew;   dx 08/ 2021 ;  chemo 10/ 2021  to 12/ 2021   Chronic kidney disease, stage 3a ([place]) 12/31/2019   Dyspnea    12-26-2020  per pt no issue most of the time   Family history of breast cancer 05/04/2021   Family history of ovarian cancer 05/04/2021   GERD (gastroesophageal reflux disease)    per pt occasionally takes tums   History of basal cell carcinoma (BCC) excision    per pt in 1970s on nose   History of chemotherapy    06-13-2020  to 12/ 2021  for right renal pelvis carcinoma   History of colon polyps    History of radiation therapy    11-13-2020 to 11-26-2020---   Right Lung- SBRT- Dr. Gery Pray   HLD (hyperlipidemia)    Hypertension    followed by pcp   Hypothyroidism, postsurgical 1979   followed by pcp   Leukocytosis    Nocturia    OA (osteoarthritis)    PONV (postoperative nausea and vomiting)    Since chemo (always nauseous)   Pre-diabetes     Primary adenocarcinoma of upper lobe of right lung [facility])    oncologist--- shadad/  pulmonology-- dr byrum;  dx 02/ 2022;  s/p  SBRT 11-13-2020 to 11-26-2020   Urgency of urination    Wears partial dentures    lower   Past Surgical History:  Procedure Laterality Date   COLONOSCOPY  last one 2017   CYSTOSCOPY WITH BIOPSY Right 09/16/2021   Procedure: CYSTOSCOPY / URETEROSCOPY WITH BIOPSY/bugbee fulgeration/right retrograde;  Surgeon: Ceasar Mons, MD;  [facility];  Service: Urology;  Laterality: Right;   CYSTOSCOPY WITH URETEROSCOPY Right 04/18/2020   Procedure: CYSTOSCOPY WITH URETEROSCOPY, BIOPSY;  Surgeon: Ceasar Mons, MD;  [facility];  Service: Urology;  Laterality: Right;  ONLY NEEDS 45 MIN   CYSTOSCOPY WITH URETEROSCOPY AND STENT PLACEMENT N/A 12/31/2020   Procedure: CYSTOSCOPY WITH RIGHT URETEROSCOPY/ BILATERAL RETROGRADE PYELOGRAM/RIGHT URETERAL BIOPSY/ LASER ABLATION/RIGHT STENT PLACEMENT;  Surgeon: Ceasar Mons, MD;  Location: [facility]  Service: Urology;  Laterality: N/A;   CYSTOSCOPY/RETROGRADE/URETEROSCOPY Right 04/22/2021   Procedure: CYSTOSCOPY/RETROGRADE/URETEROSCOPY/ STENT PLACEMENT;  Surgeon: Ceasar Mons, MD;  Location: [facility];  Service: Urology;  Laterality: Right;   EYE SURGERY Bilateral 1987   tear ducts   IR IMAGING GUIDED PORT INSERTION  06/04/2020   THYROID LOBECTOMY Right 1979   TOTAL HIP ARTHROPLASTY Right 01/28/2016   Procedure: RIGHT TOTAL HIP ARTHROPLASTY ANTERIOR APPROACH;  Surgeon: [name], MD;  [facility];  Service: Orthopedics;  Laterality: Right;   UMBILICAL HERNIA REPAIR  1980s   VIDEO BRONCHOSCOPY WITH ENDOBRONCHIAL NAVIGATION N/A 06/06/2020   Procedure: VIDEO BRONCHOSCOPY WITH ENDOBRONCHIAL NAVIGATION;  Surgeon: Collene Gobble, MD;  Location: [place];  Service: Thoracic;  Laterality: N/A;   VIDEO BRONCHOSCOPY WITH ENDOBRONCHIAL  NAVIGATION N/A 10/10/2020   Procedure: VIDEO BRONCHOSCOPY WITH ENDOBRONCHIAL NAVIGATION;  Surgeon: [name], MD;  Location: [place];  Service: Thoracic;  Laterality: N/A;   Patient Active Problem List   Diagnosis Date Noted   Genetic testing 06/02/2021   Family history of breast cancer 05/04/2021   Family history of ovarian cancer 05/04/2021   Family history of colon cancer 05/04/2021   Vitamin D deficiency 03/20/2021   Primary cancer of right upper lobe of lung ([place]) 10/21/2020   Port-A-Cath in place 06/13/2020   Pulmonary nodule 06/03/2020   Cancer of renal pelvis, unspecified laterality ([place]) 05/26/2020   Chronic kidney disease, stage 3a ([place]) 12/31/2019   Prediabetes 12/31/2019   Obesity (BMI 30-39.9) 12/31/2019   Major depression, recurrent, chronic ([place]) 10/24/2019   Leg edema 10/24/2019   Medication intolerance 10/24/2019   Benign microscopic hematuria 09/17/2019   Myalgia due to statin 17/61/6073   Umbilical hernia 71/01/2693   Sessile colonic polyp 10/04/2017   Tobacco dependence 12/09/2015   Status post right hip replacement 07/21/2015   ACE inhibitor intolerance 06/17/2015   DJD (degenerative joint disease), lumbar 06/17/2015   Essential hypertension 06/17/2015   Mixed hyperlipidemia 06/17/2015   Morbid obesity ([place]) 06/17/2015   Post-menopause on HRT (hormone replacement therapy) 06/17/2015   Postoperative hypothyroidism 06/17/2015   Primary osteoarthritis involving multiple joints 06/17/2015    REFERRING DIAG: Chronic bilateral low back pain without sciatica  THERAPY DIAG:  Difficulty in walking, not elsewhere classified  Balance problem  Muscle weakness (generalized)  Chronic low back pain, unspecified back pain laterality, unspecified whether sciatica present  Muscle spasm of back  PERTINENT HISTORY: HTN, Stage IIIa Chronic Kidney Disease, High-grade urothelial cancer and lung cancer  PRECAUTIONS: none  SUBJECTIVE: Pt reports that her pain  is decreasing, that she can stand for about 10 min before her back starts hurting  PAIN:  Are you having pain? No NPRS scale: 0/10 in supine/sitting Pain location: lumbar Pain orientation: Lower  PAIN TYPE: aching and tight Pain description: intermittent  Aggravating factors: standing Relieving factors: rest   OBJECTIVE:    PATIENT SURVEYS:  Modified Oswestry 28 / 50 = 56.0 %  FOTO 48%   LUMBARAROM/PROM   A/PROM A/PROM  09/30/2021  Flexion 50  Extension 30  Right lateral flexion    Left lateral flexion    Right rotation    Left rotation     (Blank rows = not tested)  LE MMT:   BLE strength grossly 4 to 4+/5 throughout.   LUMBAR SPECIAL TESTS:  Straight leg raise test: Positive   FUNCTIONAL TESTS:   10/14/21 BERG 43/56  09/30/2021 5 times sit to stand: 14.9 sec with limited UE use pushing up from thighs. Timed up and go (TUG): 11.8 sec   GAIT: Distance walked: 150 ft Assistive device utilized: None Level of assistance: Complete  Independence Comments: antalgic gait pattern noted.    TODAY'S TREATMENT    10/22/2021: Nustep L4 x6 min with PT present to discuss status Seated core series holding 5# weight: hip to hip, shoulder to hip, shoulder to shoulder 5x each 4 way pelvic tilt seated on dynadisc x10 each direction Sit to/from stand seated on blue foam holding 5# x10 reps 3 way Physioball rollout with green pball 5 sec x5 reps each Standing green band rows and extensions 2x10 each with cuing for maintaining core stability Hand to knee isometric push 5x each side 3 way standing hip with green loop x10 each bilat Heel raises 2x10 holding onto counter  10/20/2021: Nustep L4 x6 min with PT present to discuss status Seated core series holding 5# weight: hip to hip, shoulder to hip, shoulder to shoulder 5x each Sit to/from stand seated on blue foam holding 5# x10 reps Supine hamstring stretch with strap 2 x 20 sec B Supine clams green band 2x10  Supine green  band horizontal abduction 15x Practice log roll with supine to/from sit x3 reps with cuing for technique Seated green band rows and extensions 2x10 each with cuing for maintaining core stability 3 way Physioball rollout with green pball 5 sec x5 reps each Hand to knee isometric push 5x each side  10/16/2021: Nustep L4 x6 min with PT present to discuss status Hand to knee isometric push 5x each side Supine clams green band 20x  Supine green band horizontal abduction 15x Seated core series holding 5# weight: hip to hip, shoulder to hip, shoulder hip shoulder 5x each Standing green band rows and extensions 15x each      PATIENT EDUCATION:  Education details: Pt educated in [place]. Person educated: Patient Education method: Explanation, Demonstration, and Handouts Education comprehension: verbalized understanding and returned demonstration  HOME EXERCISE PROGRAM  Access Code: [email or web address] URL: [email or web address] Date: 10/16/2021 Prepared by: Ruben Im  Exercises Seated Transversus Abdominis Bracing - 1-2 x daily - 7 x weekly - 2 sets - 10 reps - 2-5 sec hold Supine Piriformis Stretch with Foot on Ground - 2 x daily - 7 x weekly - 3 reps - 20 hold Seated Hamstring Stretch - 2 x daily - 7 x weekly - 3 reps - 20 hold Supine Lower Trunk Rotation - 2 x daily - 7 x weekly - 10 reps Supine Transversus Abdominis Bracing - Hands on Stomach - 2 x daily - 7 x weekly - 10 reps - 5 hold Hooklying Clamshell with Resistance - 1 x daily - 7 x weekly - 1 sets - 10 reps Supine Shoulder Horizontal Abduction with Resistance - 1 x daily - 7 x weekly - 1 sets - 10 reps Standing Row with Anchored Resistance - 1 x daily - 7 x weekly - 1 sets - 10 reps Standing Shoulder Extension with Resistance - 1 x daily - 7 x weekly - 1 sets - 10 reps  ASSESSMENT:   CLINICAL IMPRESSION: Ms Salsberry presents to PT today with decreased overall pain.  Pt without pain during seated there ex, pt did report  that she had some pain of a 3/10 in lumbar region of back following approx 15 min of standing exercises. Focus on session today progressing from supine and seated there ex to the more functional standing position; pt requires cuing for TA contraction/core stability during standing to decrease strain on back.  Pt continues to require skilled PT to progress towards goal related activities and decreased pain in standing.  Objective impairments include decreased balance, decreased endurance, difficulty walking, decreased strength, increased muscle spasms, impaired flexibility, postural dysfunction, and pain. These impairments are limiting patient from cleaning and community activity.  Personal factors including Age and 1-2 comorbidities: Hx of cancer, R THA  are also affecting patient's functional outcome.    REHAB POTENTIAL: Good   CLINICAL DECISION MAKING: Evolving/moderate complexity   EVALUATION COMPLEXITY: Moderate     GOALS: Goals reviewed with patient? Yes   SHORT TERM GOALS:   STG Name Target Date Goal status  1 Pt will be independent with initial HEP. Baseline:  10/14/2021 Met  2 Pt will undergo BERG balance test to further assess balance. Baseline: 43/56 on 10/14/21 10/14/2021 Met    LONG TERM GOALS:    LTG Name Target Date Goal status  1 Pt will be independent with advanced HEP. Baseline: 11/25/2021 Ongoing  2 Pt will increase FOTO to at least 53% to demonstrate improved functional mobility. Baseline:  48% 11/25/2021 Ongoing  3 Pt will decrease modified Oswestry to at least 45% to demonstrate decreased pain. Baseline: 56% 11/25/2021 Ongoing  4 Pt will have a BERG score of at least 48/56 to place her at a low risk of falling. Baseline: 11/25/2021 Ongoing    PLAN: PT FREQUENCY: 2x/week   PT DURATION: 8 weeks   PLANNED INTERVENTIONS: Therapeutic exercises, Therapeutic activity, Neuro Muscular re-education, Balance training, Gait training, Patient/Family education, Joint  mobilization, Stair training, Aquatic Therapy, Dry Needling, Electrical stimulation, Cryotherapy, Moist heat, Taping, Traction, Ionotophoresis 45m/ml Dexamethasone, and Manual therapy   PLAN FOR NEXT SESSION: assess and progress HEP as indicated, strengthening, core stability    SJuel Burrow PT 10/22/2021, 11:51 AM  [facility] SOkolonaGM[place] [address]Phone # [phone] [phone]

## 2021-10-24 NOTE — Progress Notes (Signed)
Radiation Oncology         [phone] ________________________________  Name: Maria Marquez MRN: [id number]  Date: 10/26/2021  DOB: [birth year]  Follow-Up Visit Note  CC: [name], Rayford Halsted, MD  [name], *    ICD-10-CM   1. Primary cancer of right upper lobe of lung (HCC)  C34.11     2. 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   The patient has continued on active surveillance with Dr. Alen Blew. During a follow-up visit with Dr. Alen Blew on 07/07/21, the patient reported feeling well and denied any residual issues related to previous chemotherapy exposure.  CT of the chest abdomen and pelvis completed on October 06, 2021 showed no evidence of metastatic disease in the chest abdomen or pelvis. The 5 mm right upper lobe pulmonary nodule was also appreciated as stable in the interval.   Per her most recent follow up visit with Dr. Alen Blew on 10/13/21, the patient was again noted to report feeling well and denied any symptoms / complaints.   Other pertinent imaging performed in the interval includes:  -- DXA for bone density on 05/12/21 showed normal bone density findings. -- CT of the abdomen and pelvis on 07/03/21 demonstrated no evidence of metastatic disease in the abdomen or pelvis.    Of note: the patient underwent  ureteroscopy with stent placement on 04/22/21. On 09/16/21, the patient underwent cystoscopy with right ureteroscopy and bladder biopsies. Biopsy collected of the posterior bladder wall revealed benign urothelium and submucosa. 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 (Dr. Alen Blew did not note any concerning findings on pathology at her most recent follow-up).    She does report intermittent difficulty breathing deeply as if she cannot expand her lungs.  She denies any hemoptysis or consistent pain within the chest region.  I recommended she see her pulmonologist for further evaluation of this issue as needed.                         Allergies:  is allergic to iodine, keflex [cephalexin], shellfish allergy, lasix [furosemide], rosuvastatin, latex, lisinopril, sulfa antibiotics, and tramadol.  Meds: Current Outpatient Medications  Medication Sig Dispense Refill   acetaminophen (TYLENOL) 500 MG tablet Take 500-1,500 mg by mouth every 8 (eight) hours as needed for moderate pain.     albuterol (VENTOLIN HFA) 108 (90 Base) MCG/ACT inhaler Inhale 1-2 puffs into the lungs every 6 (six) hours as needed for wheezing or shortness of breath. 18 g 2   aspirin EC 81 MG tablet Take 81 mg by mouth daily. Swallow whole.     atorvastatin (LIPITOR) 20 MG tablet TAKE 1 TABLET (20 MG TOTAL) BY MOUTH DAILY. (Patient taking differently: Take 20 mg by mouth at bedtime.) 90 tablet  1   calcium carbonate (TUMS - DOSED IN MG ELEMENTAL CALCIUM) 500 MG chewable tablet Chew 500 mg by mouth daily as needed for indigestion or heartburn.     cyclobenzaprine (FLEXERIL) 5 MG tablet Take 1 tablet (5 mg total) by mouth at bedtime as needed for muscle spasms. 30 tablet 0   EPINEPHrine 0.3 mg/0.3 mL IJ SOAJ injection Inject 0.3 mg into the muscle as needed for anaphylaxis. 1 each 2   estradiol-norethindrone (ACTIVELLA) 1-0.5 MG tablet Take 1 tablet by mouth daily.  (Patient taking differently: Take 1 tablet by mouth every other day. Takes in evening) 28 tablet 5   ezetimibe (ZETIA) 10 MG tablet TAKE 1 TABLET EVERY DAY (Patient taking differently: Take 10 mg by mouth at bedtime.) 90 tablet 1   levothyroxine (SYNTHROID) 150 MCG tablet Take 1 tablet (150 mcg total) by mouth daily. 90 tablet 1   lidocaine-prilocaine (EMLA) cream Apply 1 application topically as needed. 30 g 0   losartan-hydrochlorothiazide (HYZAAR) 50-12.5 MG tablet TAKE 1 TABLET EVERY DAY (Patient taking differently: Take 1 tablet by mouth daily.) 90 tablet 3   meloxicam (MOBIC) 7.5 MG tablet Take 1 tablet (7.5 mg total) by mouth daily. 10 tablet 0   ondansetron (ZOFRAN) 4 MG tablet Take 1 tablet (4 mg total) by mouth every 8 (eight) hours as needed for nausea or vomiting. 20 tablet 0   vitamin B-12 (CYANOCOBALAMIN) 1000 MCG tablet Take 1,000 mcg by mouth daily.     XIIDRA 5 % SOLN Place 1 drop into both eyes 2 (two) times daily as needed (dry eyes).     oxybutynin (DITROPAN) 5 MG tablet Take 1 tablet (5 mg total) by mouth every 8 (eight) hours as needed for bladder spasms. (Patient not taking: Reported on 10/26/2021) 30 tablet 1   No current facility-administered medications for this encounter.    Physical Findings: The patient is in no acute distress. Patient is alert and oriented.  height is 5\' 6"  (1.676 m) and weight is 274 lb 6 oz (124.5 kg). Her oral temperature is 97.9 F (36.6 C). Her blood pressure is 128/83 and her pulse is 83. Her respiration is 18 and oxygen saturation is 100%. .  No significant changes. Lungs are clear to auscultation bilaterally. Heart has regular rate and rhythm. No palpable cervical, supraclavicular, or axillary adenopathy. Abdomen soft, non-tender, normal bowel sounds.    Lab Findings: Lab Results  Component Value Date   WBC 8.2 10/06/2021   HGB 12.3 10/06/2021   HCT 36.4 10/06/2021   MCV 89.7 10/06/2021   PLT 266 10/06/2021    Radiographic  Findings: CT ABDOMEN PELVIS WO CONTRAST  Result Date: 10/07/2021 CLINICAL DATA:  76 year old female with history of non-small cell lung cancer. Staging examination. Additional history of malignancy of the right renal pelvis diagnosed in 2020. EXAM: CT CHEST, ABDOMEN AND PELVIS WITHOUT CONTRAST TECHNIQUE: Multidetector CT imaging of the chest, abdomen and pelvis was performed following the standard protocol without IV contrast. RADIATION DOSE REDUCTION: This exam was performed according to the departmental dose-optimization program which includes automated exposure control, adjustment of the mA and/or kV according to patient size and/or use of iterative reconstruction technique. COMPARISON:  CT of the abdomen and pelvis 07/03/2021. CT the chest, abdomen and pelvis 03/31/2021. FINDINGS: CT CHEST FINDINGS Cardiovascular: Heart size is normal. There is no significant pericardial fluid, thickening or pericardial calcification. There is aortic atherosclerosis, as well as atherosclerosis of the great vessels of the mediastinum and the coronary arteries, including  calcified atherosclerotic plaque in the left main, left anterior descending, left circumflex and right coronary arteries. Right internal jugular single-lumen porta cath with tip terminating in the distal superior vena cava. Mediastinum/Nodes: No pathologically enlarged mediastinal or hilar lymph nodes. Please note that accurate exclusion of hilar adenopathy is limited on noncontrast CT scans. Esophagus is unremarkable in appearance. No axillary lymphadenopathy. Lungs/Pleura: Increasing ground-glass attenuation, septal thickening and regional architectural distortion around the treated nodule in the medial aspect of the right upper lobe, which is now largely obscured. 5 mm right upper lobe nodule (axial image 40 of series 4), stable. No other new suspicious appearing pulmonary nodules or masses are noted. No acute consolidative airspace disease. No pleural  effusions. Musculoskeletal: There are no aggressive appearing lytic or blastic lesions noted in the visualized portions of the skeleton. CT ABDOMEN PELVIS FINDINGS Hepatobiliary: Liver has a slightly shrunken appearance and nodular contour, indicative of underlying cirrhosis. Mild diffuse low attenuation throughout the hepatic parenchyma, indicative of hepatic steatosis. 2.0 x 1.0 cm low-attenuation lesion in segment 2 of the liver, incompletely characterized on today's non-contrast CT examination, but similar to the prior study and statistically likely to represent a cyst. Multiple tiny calcified gallstones lie dependently in the gallbladder. No findings to suggest an acute cholecystitis noted at this time. Pancreas: No definite pancreatic mass or peripancreatic fluid collections or inflammatory changes are noted on today's noncontrast CT examination. Spleen: Unremarkable. Adrenals/Urinary Tract: Unenhanced appearance of the kidneys and bilateral adrenal glands is normal. No hydroureteronephrosis. Urinary bladder is normal in appearance. Stomach/Bowel: Unenhanced appearance of the stomach is normal. No pathologic dilatation of small bowel or colon. Normal appendix. Vascular/Lymphatic: Aortic atherosclerosis. No lymphadenopathy noted in the abdomen or pelvis. Reproductive: Uterus and ovaries are unremarkable in appearance. Other: No significant volume of ascites.  No pneumoperitoneum. Musculoskeletal: Status post right hip arthroplasty. There are no aggressive appearing lytic or blastic lesions noted in the visualized portions of the skeleton. IMPRESSION: 1. Treated right upper lobe pulmonary nodule now obscured by evolving postradiation changes. No definitive findings to suggest locally recurrent disease or metastatic disease noted in the chest, abdomen or pelvis. 2. 5 mm right upper lobe pulmonary nodule, stable compared to prior examinations, likely benign. 3. Aortic atherosclerosis, in addition to left main and  three-vessel coronary artery disease. Assessment for potential risk factor modification, dietary therapy or pharmacologic therapy may be warranted, if clinically indicated. 4. Morphologic changes in the liver indicative of underlying cirrhosis. 5. Additional incidental findings, as above. Electronically Signed   By: Vinnie Langton M.D.   On: 10/07/2021 10:58   CT Chest Wo Contrast  Result Date: 10/07/2021 CLINICAL DATA:  76 year old female with history of non-small cell lung cancer. Staging examination. Additional history of malignancy of the right renal pelvis diagnosed in 2020. EXAM: CT CHEST, ABDOMEN AND PELVIS WITHOUT CONTRAST TECHNIQUE: Multidetector CT imaging of the chest, abdomen and pelvis was performed following the standard protocol without IV contrast. RADIATION DOSE REDUCTION: This exam was performed according to the departmental dose-optimization program which includes automated exposure control, adjustment of the mA and/or kV according to patient size and/or use of iterative reconstruction technique. COMPARISON:  CT of the abdomen and pelvis 07/03/2021. CT the chest, abdomen and pelvis 03/31/2021. FINDINGS: CT CHEST FINDINGS Cardiovascular: Heart size is normal. There is no significant pericardial fluid, thickening or pericardial calcification. There is aortic atherosclerosis, as well as atherosclerosis of the great vessels of the mediastinum and the coronary arteries, including calcified atherosclerotic plaque in the left  main, left anterior descending, left circumflex and right coronary arteries. Right internal jugular single-lumen porta cath with tip terminating in the distal superior vena cava. Mediastinum/Nodes: No pathologically enlarged mediastinal or hilar lymph nodes. Please note that accurate exclusion of hilar adenopathy is limited on noncontrast CT scans. Esophagus is unremarkable in appearance. No axillary lymphadenopathy. Lungs/Pleura: Increasing ground-glass attenuation, septal  thickening and regional architectural distortion around the treated nodule in the medial aspect of the right upper lobe, which is now largely obscured. 5 mm right upper lobe nodule (axial image 40 of series 4), stable. No other new suspicious appearing pulmonary nodules or masses are noted. No acute consolidative airspace disease. No pleural effusions. Musculoskeletal: There are no aggressive appearing lytic or blastic lesions noted in the visualized portions of the skeleton. CT ABDOMEN PELVIS FINDINGS Hepatobiliary: Liver has a slightly shrunken appearance and nodular contour, indicative of underlying cirrhosis. Mild diffuse low attenuation throughout the hepatic parenchyma, indicative of hepatic steatosis. 2.0 x 1.0 cm low-attenuation lesion in segment 2 of the liver, incompletely characterized on today's non-contrast CT examination, but similar to the prior study and statistically likely to represent a cyst. Multiple tiny calcified gallstones lie dependently in the gallbladder. No findings to suggest an acute cholecystitis noted at this time. Pancreas: No definite pancreatic mass or peripancreatic fluid collections or inflammatory changes are noted on today's noncontrast CT examination. Spleen: Unremarkable. Adrenals/Urinary Tract: Unenhanced appearance of the kidneys and bilateral adrenal glands is normal. No hydroureteronephrosis. Urinary bladder is normal in appearance. Stomach/Bowel: Unenhanced appearance of the stomach is normal. No pathologic dilatation of small bowel or colon. Normal appendix. Vascular/Lymphatic: Aortic atherosclerosis. No lymphadenopathy noted in the abdomen or pelvis. Reproductive: Uterus and ovaries are unremarkable in appearance. Other: No significant volume of ascites.  No pneumoperitoneum. Musculoskeletal: Status post right hip arthroplasty. There are no aggressive appearing lytic or blastic lesions noted in the visualized portions of the skeleton. IMPRESSION: 1. Treated right upper  lobe pulmonary nodule now obscured by evolving postradiation changes. No definitive findings to suggest locally recurrent disease or metastatic disease noted in the chest, abdomen or pelvis. 2. 5 mm right upper lobe pulmonary nodule, stable compared to prior examinations, likely benign. 3. Aortic atherosclerosis, in addition to left main and three-vessel coronary artery disease. Assessment for potential risk factor modification, dietary therapy or pharmacologic therapy may be warranted, if clinically indicated. 4. Morphologic changes in the liver indicative of underlying cirrhosis. 5. Additional incidental findings, as above. Electronically Signed   By: Vinnie Langton M.D.   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  Recent chest CT scan also very favorable.  She continues to follow-up regularly with Dr. Alen Blew with CT scans of the chest abdomen and pelvis for follow-up of her 2 malignancies.  Plan: In light of the patient's close follow-up with medical oncology have not scheduled her for formal follow-up appointment but would be glad to see her at any time.   20 minutes of total time was spent for this patient encounter, including preparation, face-to-face counseling with the patient and coordination of care, physical exam, and documentation of the encounter. ____________________________________  Blair Promise, PhD, MD   This document serves as a record of services personally performed by Gery Pray, MD. It was created on his behalf by Roney Mans, a trained medical scribe. The creation of this record is based on the scribe's personal  observations and the provider's statements to them. This document has been checked and approved by the attending provider.

## 2021-10-26 ENCOUNTER — Encounter: Payer: Self-pay | Admitting: Radiation Oncology

## 2021-10-26 ENCOUNTER — Ambulatory Visit
Admission: RE | Admit: 2021-10-26 | Discharge: 2021-10-26 | Disposition: A | Discharge status: Home / Self Care | Payer: Medicare HMO | Source: Ambulatory Visit | Attending: Radiation Oncology | Admitting: Radiation Oncology

## 2021-10-26 ENCOUNTER — Other Ambulatory Visit: Payer: Self-pay

## 2021-10-26 VITALS — BP 128/83 | HR 83 | Temp 97.9°F | Resp 18 | Ht 66.0 in | Wt 274.4 lb

## 2021-10-26 DIAGNOSIS — Z791 Long term (current) use of non-steroidal anti-inflammatories (NSAID): Secondary | ICD-10-CM | POA: Insufficient documentation

## 2021-10-26 DIAGNOSIS — Z7982 Long term (current) use of aspirin: Secondary | ICD-10-CM | POA: Diagnosis not present

## 2021-10-26 DIAGNOSIS — Z923 Personal history of irradiation: Secondary | ICD-10-CM | POA: Diagnosis not present

## 2021-10-26 DIAGNOSIS — I700 Atherosclerosis of aorta: Secondary | ICD-10-CM | POA: Insufficient documentation

## 2021-10-26 DIAGNOSIS — Z79899 Other long term (current) drug therapy: Secondary | ICD-10-CM | POA: Insufficient documentation

## 2021-10-26 DIAGNOSIS — I2510 Atherosclerotic heart disease of native coronary artery without angina pectoris: Secondary | ICD-10-CM | POA: Diagnosis not present

## 2021-10-26 DIAGNOSIS — Z85118 Personal history of other malignant neoplasm of bronchus and lung: Secondary | ICD-10-CM | POA: Diagnosis not present

## 2021-10-26 DIAGNOSIS — C659 Malignant neoplasm of unspecified renal pelvis: Secondary | ICD-10-CM

## 2021-10-26 DIAGNOSIS — C3411 Malignant neoplasm of upper lobe, right bronchus or lung: Secondary | ICD-10-CM

## 2021-10-26 DIAGNOSIS — Z08 Encounter for follow-up examination after completed treatment for malignant neoplasm: Secondary | ICD-10-CM | POA: Diagnosis not present

## 2021-10-26 NOTE — Progress Notes (Signed)
Maria Marquez is here today for follow up post radiation to the lung. ? ?Lung Side: right ? ?Completed radiation treatment on: 11/26/2020 ? ?Does the patient complain of any of the following: ?Pain:denies ?Shortness of breath w/wo exertion: shortness of breath with exertion ?Cough: productive cough with cloudy phlegm ?Hemoptysis: denies ?Pain with swallowing: denies ?Swallowing/choking concerns: denies ?Appetite: good ?Energy Level: moderate ?Post radiation skin Changes: denies ? ? ? ?Additional comments if applicable: intermittent difficulty breathing deeply, intermittent chest pain, heavy nosebleeds ? ?Vitals:  ? 10/26/21 1109  ?BP: 128/83  ?Pulse: 83  ?Resp: 18  ?Temp: 97.9 ?F (36.6 ?C)  ?TempSrc: Oral  ?SpO2: 100%  ?Weight: 274 lb 6 oz (124.5 kg)  ?Height: 5\' 6"  (1.676 m)  ? ? ? ?

## 2021-10-27 ENCOUNTER — Encounter: Payer: Self-pay | Admitting: Rehabilitative and Restorative Service Providers"

## 2021-10-27 ENCOUNTER — Ambulatory Visit: Payer: Medicare HMO | Admitting: Rehabilitative and Restorative Service Providers"

## 2021-10-27 DIAGNOSIS — R2689 Other abnormalities of gait and mobility: Secondary | ICD-10-CM | POA: Diagnosis not present

## 2021-10-27 DIAGNOSIS — G8929 Other chronic pain: Secondary | ICD-10-CM | POA: Diagnosis not present

## 2021-10-27 DIAGNOSIS — M6281 Muscle weakness (generalized): Secondary | ICD-10-CM | POA: Diagnosis not present

## 2021-10-27 DIAGNOSIS — M545 Low back pain, unspecified: Secondary | ICD-10-CM | POA: Diagnosis not present

## 2021-10-27 DIAGNOSIS — R262 Difficulty in walking, not elsewhere classified: Secondary | ICD-10-CM | POA: Diagnosis not present

## 2021-10-27 DIAGNOSIS — M62830 Muscle spasm of back: Secondary | ICD-10-CM

## 2021-10-27 NOTE — Therapy (Signed)
OUTPATIENT PHYSICAL THERAPY TREATMENT NOTE   Patient Name: Maria Marquez MRN: [id number] DOB:04/27/1946, 76 y.o., female Today's Date: 10/27/2021  PCP: [name], Rayford Halsted, MD REFERRING PROVIDER: [name], Estel*   PT End of Session - 10/27/21 1143     Visit Number 7    Date for PT Re-Evaluation 11/20/21    Authorization Type [employer] 12 visits    Authorization Time Period 09/30/21 to 11/20/21    Authorization - Visit Number 7    Authorization - Number of Visits 12    Progress Note Due on Visit 10    PT Start Time 7829    PT Stop Time 1225    PT Time Calculation (min) 40 min    Activity Tolerance Patient tolerated treatment well    Behavior During Therapy [facility] for tasks assessed/performed              Past Medical History:  Diagnosis Date   Anemia associated with chemotherapy    followed by dr Alen Blew   Bilateral lower extremity edema    per pt occasionally ankles swell   Carcinoma of renal pelvis, right [facility])    urologist-- dr winter/  oncologist-- dr Alen Blew;   dx 08/ 2021 ;  chemo 10/ 2021  to 12/ 2021   Chronic kidney disease, stage 3a ([place]) 12/31/2019   Dyspnea    12-26-2020  per pt no issue most of the time   Family history of breast cancer 05/04/2021   Family history of ovarian cancer 05/04/2021   GERD (gastroesophageal reflux disease)    per pt occasionally takes tums   History of basal cell carcinoma (BCC) excision    per pt in 1970s on nose   History of chemotherapy    06-13-2020  to 12/ 2021  for right renal pelvis carcinoma   History of colon polyps    History of radiation therapy    11-13-2020 to 11-26-2020---   Right Lung- SBRT- Dr. Gery Pray   HLD (hyperlipidemia)    Hypertension    followed by pcp   Hypothyroidism, postsurgical 1979   followed by pcp   Leukocytosis    Nocturia    OA (osteoarthritis)    PONV (postoperative nausea and vomiting)    Since chemo (always nauseous)   Pre-diabetes    Primary adenocarcinoma of  upper lobe of right lung [facility])    oncologist--- shadad/  pulmonology-- dr byrum;  dx 02/ 2022;  s/p  SBRT 11-13-2020 to 11-26-2020   Urgency of urination    Wears partial dentures    lower   Past Surgical History:  Procedure Laterality Date   COLONOSCOPY  last one 2017   CYSTOSCOPY WITH BIOPSY Right 09/16/2021   Procedure: CYSTOSCOPY / URETEROSCOPY WITH BIOPSY/bugbee fulgeration/right retrograde;  Surgeon: Ceasar Mons, MD;  Location: [facility];  Service: Urology;  Laterality: Right;   CYSTOSCOPY WITH URETEROSCOPY Right 04/18/2020   Procedure: CYSTOSCOPY WITH URETEROSCOPY, BIOPSY;  Surgeon: Ceasar Mons, MD;  Location: [facility];  Service: Urology;  Laterality: Right;  ONLY NEEDS 45 MIN   CYSTOSCOPY WITH URETEROSCOPY AND STENT PLACEMENT N/A 12/31/2020   Procedure: CYSTOSCOPY WITH RIGHT URETEROSCOPY/ BILATERAL RETROGRADE PYELOGRAM/RIGHT URETERAL BIOPSY/ LASER ABLATION/RIGHT STENT PLACEMENT;  Surgeon: Ceasar Mons, MD;  Location: [facility];  Service: Urology;  Laterality: N/A;   CYSTOSCOPY/RETROGRADE/URETEROSCOPY Right 04/22/2021   Procedure: CYSTOSCOPY/RETROGRADE/URETEROSCOPY/ STENT PLACEMENT;  Surgeon: Ceasar Mons, MD;  Location: [facility];  Service: Urology;  Laterality: Right;  EYE SURGERY Bilateral 1987   tear ducts   IR IMAGING GUIDED PORT INSERTION  06/04/2020   THYROID LOBECTOMY Right 1979   TOTAL HIP ARTHROPLASTY Right 01/28/2016   Procedure: RIGHT TOTAL HIP ARTHROPLASTY ANTERIOR APPROACH;  Surgeon: [name], MD;  Location: [facility];  Service: Orthopedics;  Laterality: Right;   UMBILICAL HERNIA REPAIR  1980s   VIDEO BRONCHOSCOPY WITH ENDOBRONCHIAL NAVIGATION N/A 06/06/2020   Procedure: VIDEO BRONCHOSCOPY WITH ENDOBRONCHIAL NAVIGATION;  Surgeon: Collene Gobble, MD;  Location: [place];  Service: Thoracic;  Laterality: N/A;   VIDEO BRONCHOSCOPY WITH ENDOBRONCHIAL NAVIGATION N/A 10/10/2020    Procedure: VIDEO BRONCHOSCOPY WITH ENDOBRONCHIAL NAVIGATION;  Surgeon: [name], MD;  Location: [place];  Service: Thoracic;  Laterality: N/A;   Patient Active Problem List   Diagnosis Date Noted   Genetic testing 06/02/2021   Family history of breast cancer 05/04/2021   Family history of ovarian cancer 05/04/2021   Family history of colon cancer 05/04/2021   Vitamin D deficiency 03/20/2021   Primary cancer of right upper lobe of lung ([place]) 10/21/2020   Port-A-Cath in place 06/13/2020   Pulmonary nodule 06/03/2020   Cancer of renal pelvis, unspecified laterality ([place]) 05/26/2020   Chronic kidney disease, stage 3a ([place]) 12/31/2019   Prediabetes 12/31/2019   Obesity (BMI 30-39.9) 12/31/2019   Major depression, recurrent, chronic ([place]) 10/24/2019   Leg edema 10/24/2019   Medication intolerance 10/24/2019   Benign microscopic hematuria 09/17/2019   Myalgia due to statin 81/59/4707   Umbilical hernia 61/51/8343   Sessile colonic polyp 10/04/2017   Tobacco dependence 12/09/2015   Status post right hip replacement 07/21/2015   ACE inhibitor intolerance 06/17/2015   DJD (degenerative joint disease), lumbar 06/17/2015   Essential hypertension 06/17/2015   Mixed hyperlipidemia 06/17/2015   Morbid obesity ([place]) 06/17/2015   Post-menopause on HRT (hormone replacement therapy) 06/17/2015   Postoperative hypothyroidism 06/17/2015   Primary osteoarthritis involving multiple joints 06/17/2015    REFERRING DIAG: Chronic bilateral low back pain without sciatica  THERAPY DIAG:  Difficulty in walking, not elsewhere classified  Chronic low back pain, unspecified back pain laterality, unspecified whether sciatica present  Balance problem  Muscle spasm of back  Muscle weakness (generalized)  PERTINENT HISTORY: HTN, Stage IIIa Chronic Kidney Disease, High-grade urothelial cancer and lung cancer  PRECAUTIONS: none  SUBJECTIVE: Pt reports that her Radiologist appointment went  well  PAIN:  Are you having pain? No NPRS scale: 0/10 in supine/sitting.  Reports 7/10 pain this AM, but has decreased since. Pain location: lumbar Pain orientation: Lower  PAIN TYPE: aching and tight Pain description: intermittent  Aggravating factors: standing Relieving factors: rest   OBJECTIVE:    PATIENT SURVEYS:  09/30/2021: Modified Oswestry 28 / 50 = 56.0 %  FOTO 48%   LUMBARAROM/PROM   A/PROM A/PROM  09/30/2021  Flexion 50  Extension 30  Right lateral flexion    Left lateral flexion    Right rotation    Left rotation     (Blank rows = not tested)  LE MMT:   BLE strength grossly 4 to 4+/5 throughout on 09/30/2021.   LUMBAR SPECIAL TESTS:  Straight leg raise test: Positive   FUNCTIONAL TESTS:   10/14/21 BERG 43/56  09/30/2021 5 times sit to stand: 14.9 sec with limited UE use pushing up from thighs. Timed up and go (TUG): 11.8 sec   GAIT: Distance walked: 150 ft Assistive device utilized: None Level of assistance: Complete Independence Comments: antalgic gait pattern noted.  TODAY'S TREATMENT    10/27/2021: Nustep L4 x6 min with PT present to discuss status Hand to knee isometric push 5x each side 4 way pelvic tilt seated on dynadisc x10 each direction Sit to/from stand seated on blue foam holding 5# x10 reps Seated core series holding 5# weight: hip to hip, shoulder to hip x10 each Seated crunches with 5# weight 2x10 3 way standing hip with green loop x12 each bilat Heel raises 2x10 holding onto counter Standing green band rows and extensions 2x10 each with cuing for maintaining core stability Manual therapy to lumbar region with manual trigger point release to trigger point in left lumbar multifidi  10/22/2021: Nustep L4 x6 min with PT present to discuss status Seated core series holding 5# weight: hip to hip, shoulder to hip, shoulder to shoulder 5x each 4 way pelvic tilt seated on dynadisc x10 each direction Sit to/from stand seated on blue foam  holding 5# x10 reps 3 way Physioball rollout with green pball 5 sec x5 reps each Standing green band rows and extensions 2x10 each with cuing for maintaining core stability Hand to knee isometric push 5x each side 3 way standing hip with green loop x10 each bilat Heel raises 2x10 holding onto counter  10/20/2021: Nustep L4 x6 min with PT present to discuss status Seated core series holding 5# weight: hip to hip, shoulder to hip, shoulder to shoulder 5x each Sit to/from stand seated on blue foam holding 5# x10 reps Supine hamstring stretch with strap 2 x 20 sec B Supine clams green band 2x10  Supine green band horizontal abduction 15x Practice log roll with supine to/from sit x3 reps with cuing for technique Seated green band rows and extensions 2x10 each with cuing for maintaining core stability 3 way Physioball rollout with green pball 5 sec x5 reps each Hand to knee isometric push 5x each side      PATIENT EDUCATION:  Education details: Pt educated in [place]. Person educated: Patient Education method: Explanation, Demonstration, and Handouts Education comprehension: verbalized understanding and returned demonstration  HOME EXERCISE PROGRAM  Access Code: [email or web address] URL: [email or web address] Date: 10/16/2021 Prepared by: Ruben Im  Exercises Seated Transversus Abdominis Bracing - 1-2 x daily - 7 x weekly - 2 sets - 10 reps - 2-5 sec hold Supine Piriformis Stretch with Foot on Ground - 2 x daily - 7 x weekly - 3 reps - 20 hold Seated Hamstring Stretch - 2 x daily - 7 x weekly - 3 reps - 20 hold Supine Lower Trunk Rotation - 2 x daily - 7 x weekly - 10 reps Supine Transversus Abdominis Bracing - Hands on Stomach - 2 x daily - 7 x weekly - 10 reps - 5 hold Hooklying Clamshell with Resistance - 1 x daily - 7 x weekly - 1 sets - 10 reps Supine Shoulder Horizontal Abduction with Resistance - 1 x daily - 7 x weekly - 1 sets - 10 reps Standing Row with Anchored Resistance  - 1 x daily - 7 x weekly - 1 sets - 10 reps Standing Shoulder Extension with Resistance - 1 x daily - 7 x weekly - 1 sets - 10 reps  ASSESSMENT:   CLINICAL IMPRESSION: Ms Cotterman presents to PT today with some pain this AM, but denies current pain as she has been sitting.  Pt able to progress with standing ther ex and continues to require cuing for core stability and engagement during there ex.  Pt reports decreased pain following manual therapy  to lumbar region.  Pt continues to require skilled PT to progress towards goal related activities.   Objective impairments include decreased balance, decreased endurance, difficulty walking, decreased strength, increased muscle spasms, impaired flexibility, postural dysfunction, and pain. These impairments are limiting patient from cleaning and community activity.  Personal factors including Age and 1-2 comorbidities: Hx of cancer, R THA  are also affecting patient's functional outcome.    REHAB POTENTIAL: Good   CLINICAL DECISION MAKING: Evolving/moderate complexity   EVALUATION COMPLEXITY: Moderate     GOALS: Goals reviewed with patient? Yes   SHORT TERM GOALS:   STG Name Target Date Goal status  1 Pt will be independent with initial HEP. Baseline:  10/14/2021 Met  2 Pt will undergo BERG balance test to further assess balance. Baseline: 43/56 on 10/14/21 10/14/2021 Met    LONG TERM GOALS:    LTG Name Target Date Goal status  1 Pt will be independent with advanced HEP. Baseline: 11/25/2021 Ongoing  2 Pt will increase FOTO to at least 53% to demonstrate improved functional mobility. Baseline:  48% 11/25/2021 Ongoing  3 Pt will decrease modified Oswestry to at least 45% to demonstrate decreased pain. Baseline: 56% 11/25/2021 Ongoing  4 Pt will have a BERG score of at least 48/56 to place her at a low risk of falling. Baseline: 11/25/2021 Ongoing    PLAN: PT FREQUENCY: 2x/week   PT DURATION: 8 weeks   PLANNED INTERVENTIONS: Therapeutic  exercises, Therapeutic activity, Neuro Muscular re-education, Balance training, Gait training, Patient/Family education, Joint mobilization, Stair training, Aquatic Therapy, Dry Needling, Electrical stimulation, Cryotherapy, Moist heat, Taping, Traction, Ionotophoresis 29m/ml Dexamethasone, and Manual therapy   PLAN FOR NEXT SESSION: assess and progress HEP as indicated, strengthening, core stability    SJuel Burrow PT 10/27/2021, 12:35 PM  [facility] [place] [id number] # [phone] [phone]

## 2021-10-29 ENCOUNTER — Encounter: Payer: Medicare HMO | Admitting: Rehabilitative and Restorative Service Providers"

## 2021-11-03 ENCOUNTER — Telehealth: Payer: Self-pay | Admitting: Internal Medicine

## 2021-11-03 ENCOUNTER — Encounter: Payer: Self-pay | Admitting: Rehabilitative and Restorative Service Providers"

## 2021-11-03 ENCOUNTER — Ambulatory Visit: Payer: Medicare HMO | Admitting: Rehabilitative and Restorative Service Providers"

## 2021-11-03 ENCOUNTER — Other Ambulatory Visit: Payer: Self-pay

## 2021-11-03 DIAGNOSIS — R262 Difficulty in walking, not elsewhere classified: Secondary | ICD-10-CM | POA: Diagnosis not present

## 2021-11-03 DIAGNOSIS — M545 Low back pain, unspecified: Secondary | ICD-10-CM

## 2021-11-03 DIAGNOSIS — R2689 Other abnormalities of gait and mobility: Secondary | ICD-10-CM

## 2021-11-03 DIAGNOSIS — G8929 Other chronic pain: Secondary | ICD-10-CM | POA: Diagnosis not present

## 2021-11-03 DIAGNOSIS — M62830 Muscle spasm of back: Secondary | ICD-10-CM | POA: Diagnosis not present

## 2021-11-03 DIAGNOSIS — M6281 Muscle weakness (generalized): Secondary | ICD-10-CM | POA: Diagnosis not present

## 2021-11-03 NOTE — Therapy (Signed)
?OUTPATIENT PHYSICAL THERAPY TREATMENT NOTE ? ? ?Patient Name: Maria Marquez ?MRN: [id number] ?DOB:Dec 13, 1945, 76 y.o., female ?Today's Date: 11/03/2021 ? ?PCP: [name], Rayford Halsted, MD ?REFERRING PROVIDER: [name], Estel* ? ? PT End of Session - 11/03/21 1148   ? ? Visit Number 8   ? Number of Visits 12   ? Date for PT Re-Evaluation 11/20/21   ? Authorization Type [employer] 12 visits   ? Authorization Time Period 09/30/21 to 11/20/21   ? Authorization - Visit Number 8   ? Authorization - Number of Visits 12   ? Progress Note Due on Visit 10   ? PT Start Time 1145   ? PT Stop Time 1225   ? PT Time Calculation (min) 40 min   ? Activity Tolerance Patient tolerated treatment well   ? Behavior During Therapy [facility] for tasks assessed/performed   ? ?  ?  ? ?  ? ? ? ?Past Medical History:  ?Diagnosis Date  ? Anemia associated with chemotherapy   ? followed by dr Alen Blew  ? Bilateral lower extremity edema   ? per pt occasionally ankles swell  ? Carcinoma of renal pelvis, right ([place])   ? urologist-- dr winter/  oncologist-- dr Alen Blew;   dx 08/ 2021 ;  chemo 10/ 2021  to 12/ 2021  ? Chronic kidney disease, stage 3a ([place]) 12/31/2019  ? Dyspnea   ? 12-26-2020  per pt no issue most of the time  ? Family history of breast cancer 05/04/2021  ? Family history of ovarian cancer 05/04/2021  ? GERD (gastroesophageal reflux disease)   ? per pt occasionally takes tums  ? History of basal cell carcinoma (BCC) excision   ? per pt in 1970s on nose  ? History of chemotherapy   ? 06-13-2020  to 12/ 2021  for right renal pelvis carcinoma  ? History of colon polyps   ? History of radiation therapy   ? 11-13-2020 to 11-26-2020---   Right Lung- SBRT- Dr. Gery Pray  ? HLD (hyperlipidemia)   ? Hypertension   ? followed by pcp  ? Hypothyroidism, postsurgical 1979  ? followed by pcp  ? Leukocytosis   ? Nocturia   ? OA (osteoarthritis)   ? PONV (postoperative nausea and vomiting)   ? Since chemo (always nauseous)  ? Pre-diabetes   ?  Primary adenocarcinoma of upper lobe of right lung ([place])   ? oncologist--- shadad/  pulmonology-- dr byrum;  dx 02/ 2022;  s/p  SBRT 11-13-2020 to 11-26-2020  ? Urgency of urination   ? Wears partial dentures   ? lower  ? ?Past Surgical History:  ?Procedure Laterality Date  ? COLONOSCOPY  last one 2017  ? CYSTOSCOPY WITH BIOPSY Right 09/16/2021  ? Procedure: CYSTOSCOPY / URETEROSCOPY WITH BIOPSY/bugbee fulgeration/right retrograde;  Surgeon: Ceasar Mons, MD;  [facility];  Service: Urology;  Laterality: Right;  ? CYSTOSCOPY WITH URETEROSCOPY Right 04/18/2020  ? Procedure: CYSTOSCOPY WITH URETEROSCOPY, BIOPSY;  Surgeon: Ceasar Mons, MD;  [facility];  Service: Urology;  Laterality: Right;  ONLY NEEDS 45 MIN  ? CYSTOSCOPY WITH URETEROSCOPY AND STENT PLACEMENT N/A 12/31/2020  ? Procedure: CYSTOSCOPY WITH RIGHT URETEROSCOPY/ BILATERAL RETROGRADE PYELOGRAM/RIGHT URETERAL BIOPSY/ LASER ABLATION/RIGHT STENT PLACEMENT;  Surgeon: Ceasar Mons, MD;  Location: [facility];  Service: Urology;  Laterality: N/A;  ? CYSTOSCOPY/RETROGRADE/URETEROSCOPY Right 04/22/2021  ? Procedure: CYSTOSCOPY/RETROGRADE/URETEROSCOPY/ STENT PLACEMENT;  Surgeon: Ceasar Mons, MD;  [facility];  Service: Urology;  Laterality: Right;  ? EYE SURGERY Bilateral 1987  ? tear ducts  ? IR IMAGING GUIDED PORT INSERTION  06/04/2020  ? THYROID LOBECTOMY Right 1979  ? TOTAL HIP ARTHROPLASTY Right 01/28/2016  ? Procedure: RIGHT TOTAL HIP ARTHROPLASTY ANTERIOR APPROACH;  Surgeon: [name], MD;  Location[facility];  Service: Orthopedics;  Laterality: Right;  ? UMBILICAL HERNIA REPAIR  1980s  ? VIDEO BRONCHOSCOPY WITH ENDOBRONCHIAL NAVIGATION N/A 06/06/2020  ? Procedure: VIDEO BRONCHOSCOPY WITH ENDOBRONCHIAL NAVIGATION;  Surgeon: Collene Gobble, MD;  Location[facility];  Service: Thoracic;  Laterality: N/A;  ? VIDEO BRONCHOSCOPY WITH ENDOBRONCHIAL  NAVIGATION N/A 10/10/2020  ? Procedure: VIDEO BRONCHOSCOPY WITH ENDOBRONCHIAL NAVIGATION;  Surgeon: [name], MD;  Location: [place];  Service: Thoracic;  Laterality: N/A;  ? ?Patient Active Problem List  ? Diagnosis Date Noted  ? Genetic testing 06/02/2021  ? Family history of breast cancer 05/04/2021  ? Family history of ovarian cancer 05/04/2021  ? Family history of colon cancer 05/04/2021  ? Vitamin D deficiency 03/20/2021  ? Primary cancer of right upper lobe of lung ([place]) 10/21/2020  ? Port-A-Cath in place 06/13/2020  ? Pulmonary nodule 06/03/2020  ? Cancer of renal pelvis, unspecified laterality ([place]) 05/26/2020  ? Chronic kidney disease, stage 3a ([place]) 12/31/2019  ? Prediabetes 12/31/2019  ? Obesity (BMI 30-39.9) 12/31/2019  ? Major depression, recurrent, chronic ([place]) 10/24/2019  ? Leg edema 10/24/2019  ? Medication intolerance 10/24/2019  ? Benign microscopic hematuria 09/17/2019  ? Myalgia due to statin 11/23/2018  ? Umbilical hernia 89/21/1941  ? Sessile colonic polyp 10/04/2017  ? Tobacco dependence 12/09/2015  ? Status post right hip replacement 07/21/2015  ? ACE inhibitor intolerance 06/17/2015  ? DJD (degenerative joint disease), lumbar 06/17/2015  ? Essential hypertension 06/17/2015  ? Mixed hyperlipidemia 06/17/2015  ? Morbid obesity ([place]) 06/17/2015  ? Post-menopause on HRT (hormone replacement therapy) 06/17/2015  ? Postoperative hypothyroidism 06/17/2015  ? Primary osteoarthritis involving multiple joints 06/17/2015  ? ? ?REFERRING DIAG: Chronic bilateral low back pain without sciatica ? ?THERAPY DIAG:  ?Difficulty in walking, not elsewhere classified ? ?Chronic low back pain, unspecified back pain laterality, unspecified whether sciatica present ? ?Balance problem ? ?Muscle spasm of back ? ?Muscle weakness (generalized) ? ?PERTINENT HISTORY: HTN, Stage IIIa Chronic Kidney Disease, High-grade urothelial cancer and lung cancer ? ?PRECAUTIONS: none ? ?SUBJECTIVE: Pt denies current back  pain, but states that she is having some knee pain, that is worse when navigating steps.  Pt reports having some back pain during housework yesterday that subsided with rest.  Pt states that she can tell that she is getting some better. ? ?PAIN:  ?Are you having pain? Yes ?NPRS scale: 6/10 ?Pain location: knees ?Pain orientation: Bilateral  ?PAIN TYPE: aching and tight ?Pain description: intermittent  ?Aggravating factors: steps ?Relieving factors: rest ? ? ?OBJECTIVE:  ?  ?PATIENT SURVEYS:  ?09/30/2021: ?Modified Oswestry 28 / 50 = 56.0 %  ?FOTO 48% ?  ?LUMBARAROM/PROM ?  ?A/PROM A/PROM  ?09/30/2021  ?Flexion 50  ?Extension 30  ?Right lateral flexion    ?Left lateral flexion    ?Right rotation    ?Left rotation    ? (Blank rows = not tested) ? ?LE MMT: ?  ?BLE strength grossly 4 to 4+/5 throughout on 09/30/2021. ?  ?LUMBAR SPECIAL TESTS:  ?Straight leg raise test: Positive ?  ?FUNCTIONAL TESTS:  ? ?10/14/21 ?BERG 43/56 ? ?09/30/2021 ?5 times sit to stand: 14.9 sec with limited  UE use pushing up from thighs. ?Timed up and go (TUG): 11.8 sec ?  ?GAIT: ?Distance walked: 150 ft ?Assistive device utilized: None ?Level of assistance: Complete Independence ?Comments: antalgic gait pattern noted. ?  ? TODAY'S TREATMENT  ?  ?11/03/2021: ?Nustep L5 x6 min with PT present to discuss status ?Hand to knee isometric push 5x each side ?Supine hamstring stretch with strap 2x20 sec bilat. ?Hooklying piriformis stretch 2x20 sec bilat. ?Lower trunk rotation 5 x 10 sec bilat ?Supine:  Straight leg raise, bridge, hooklying clamshell with blue loop 2x10 each bilat ?Seated crunches with 5# weight 2x10 ?Seated core series holding 5# weight: hip to hip, shoulder to hip x10 each ? ?10/27/2021: ?Nustep L4 x6 min with PT present to discuss status ?Hand to knee isometric push 5x each side ?4 way pelvic tilt seated on dynadisc x10 each direction ?Sit to/from stand seated on blue foam holding 5# x10 reps ?Seated core series holding 5# weight: hip to hip,  shoulder to hip x10 each ?Seated crunches with 5# weight 2x10 ?3 way standing hip with green loop x12 each bilat ?Heel raises 2x10 holding onto counter ?Standing green band rows and extensions 2x10 each wi

## 2021-11-03 NOTE — Chronic Care Management (AMB) (Signed)
?  Chronic Care Management  ? ?Note ? ?11/03/2021 ?Name: Maria Marquez MRN: [id number] DOB: [birth year] ? ?Maria Marquez is a 76 y.o. year old female who is a primary care patient of [name], Rayford Halsted, MD. I reached out to [name] by phone today in response to a referral sent by Ms. Frann Rider PCP, [name], Rayford Halsted, MD.  ? ?[name] was given information about Chronic Care Management services today including:  ?CCM service includes personalized support from designated clinical staff supervised by her physician, including individualized plan of care and coordination with other care providers ?24/7 contact phone numbers for assistance for urgent and routine care needs. ?Service will only be billed when office clinical staff spend 20 minutes or more in a month to coordinate care. ?Only one practitioner may furnish and bill the service in a calendar month. ?The patient may stop CCM services at any time (effective at the end of the month) by phone call to the office staff. ? ? ?Patient agreed to services and verbal consent obtained.  ? ?Follow up plan: ? ? ?Tatjana Dellinger ?Upstream Scheduler  ?

## 2021-11-05 ENCOUNTER — Other Ambulatory Visit: Payer: Self-pay

## 2021-11-05 ENCOUNTER — Encounter: Payer: Self-pay | Admitting: Rehabilitative and Restorative Service Providers"

## 2021-11-05 ENCOUNTER — Ambulatory Visit: Payer: Medicare HMO | Admitting: Rehabilitative and Restorative Service Providers"

## 2021-11-05 DIAGNOSIS — M6281 Muscle weakness (generalized): Secondary | ICD-10-CM | POA: Diagnosis not present

## 2021-11-05 DIAGNOSIS — M62830 Muscle spasm of back: Secondary | ICD-10-CM | POA: Diagnosis not present

## 2021-11-05 DIAGNOSIS — G8929 Other chronic pain: Secondary | ICD-10-CM | POA: Diagnosis not present

## 2021-11-05 DIAGNOSIS — R2689 Other abnormalities of gait and mobility: Secondary | ICD-10-CM | POA: Diagnosis not present

## 2021-11-05 DIAGNOSIS — M545 Low back pain, unspecified: Secondary | ICD-10-CM | POA: Diagnosis not present

## 2021-11-05 DIAGNOSIS — R262 Difficulty in walking, not elsewhere classified: Secondary | ICD-10-CM | POA: Diagnosis not present

## 2021-11-05 NOTE — Therapy (Signed)
?OUTPATIENT PHYSICAL THERAPY TREATMENT NOTE ? ? ?Patient Name: Maria Marquez ?MRN: [id number] ?DOB:05/13/1946, 76 y.o., female ?Today's Date: 11/05/2021 ? ?PCP: [name], Rayford Halsted, MD ?REFERRING PROVIDER: [name], Estel* ? ? PT End of Session - 11/05/21 1144   ? ? Visit Number 9   ? Number of Visits 12   ? Date for PT Re-Evaluation 11/20/21   ? Authorization Type [employer] 12 visits   ? Authorization Time Period 09/30/21 to 11/20/21   ? Authorization - Visit Number 9   ? Authorization - Number of Visits 12   ? Progress Note Due on Visit 10   ? PT Start Time 1142   ? PT Stop Time 1226   ? PT Time Calculation (min) 44 min   ? Activity Tolerance Patient tolerated treatment well   ? Behavior During Therapy [facility] for tasks assessed/performed   ? ?  ?  ? ?  ? ? ? ?Past Medical History:  ?Diagnosis Date  ? Anemia associated with chemotherapy   ? followed by dr Alen Blew  ? Bilateral lower extremity edema   ? per pt occasionally ankles swell  ? Carcinoma of renal pelvis, right ([place])   ? urologist-- dr winter/  oncologist-- dr Alen Blew;   dx 08/ 2021 ;  chemo 10/ 2021  to 12/ 2021  ? Chronic kidney disease, stage 3a ([place]) 12/31/2019  ? Dyspnea   ? 12-26-2020  per pt no issue most of the time  ? Family history of breast cancer 05/04/2021  ? Family history of ovarian cancer 05/04/2021  ? GERD (gastroesophageal reflux disease)   ? per pt occasionally takes tums  ? History of basal cell carcinoma (BCC) excision   ? per pt in 1970s on nose  ? History of chemotherapy   ? 06-13-2020  to 12/ 2021  for right renal pelvis carcinoma  ? History of colon polyps   ? History of radiation therapy   ? 11-13-2020 to 11-26-2020---   Right Lung- SBRT- Dr. Gery Pray  ? HLD (hyperlipidemia)   ? Hypertension   ? followed by pcp  ? Hypothyroidism, postsurgical 1979  ? followed by pcp  ? Leukocytosis   ? Nocturia   ? OA (osteoarthritis)   ? PONV (postoperative nausea and vomiting)   ? Since chemo (always nauseous)  ? Pre-diabetes   ?  Primary adenocarcinoma of upper lobe of right lung ([place])   ? oncologist--- shadad/  pulmonology-- dr byrum;  dx 02/ 2022;  s/p  SBRT 11-13-2020 to 11-26-2020  ? Urgency of urination   ? Wears partial dentures   ? lower  ? ?Past Surgical History:  ?Procedure Laterality Date  ? COLONOSCOPY  last one 2017  ? CYSTOSCOPY WITH BIOPSY Right 09/16/2021  ? Procedure: CYSTOSCOPY / URETEROSCOPY WITH BIOPSY/bugbee fulgeration/right retrograde;  Surgeon: Ceasar Mons, MD;  Location: [facility];  Service: Urology;  Laterality: Right;  ? CYSTOSCOPY WITH URETEROSCOPY Right 04/18/2020  ? Procedure: CYSTOSCOPY WITH URETEROSCOPY, BIOPSY;  Surgeon: Ceasar Mons, MD;  [facility];  Service: Urology;  Laterality: Right;  ONLY NEEDS 45 MIN  ? CYSTOSCOPY WITH URETEROSCOPY AND STENT PLACEMENT N/A 12/31/2020  ? Procedure: CYSTOSCOPY WITH RIGHT URETEROSCOPY/ BILATERAL RETROGRADE PYELOGRAM/RIGHT URETERAL BIOPSY/ LASER ABLATION/RIGHT STENT PLACEMENT;  Surgeon: Ceasar Mons, MD;  Location: [facility];  Service: Urology;  Laterality: N/A;  ? CYSTOSCOPY/RETROGRADE/URETEROSCOPY Right 04/22/2021  ? Procedure: CYSTOSCOPY/RETROGRADE/URETEROSCOPY/ STENT PLACEMENT;  Surgeon: Ceasar Mons, MD;  Location: [facility];  Service: Urology;  Laterality: Right;  ? EYE SURGERY Bilateral 1987  ? tear ducts  ? IR IMAGING GUIDED PORT INSERTION  06/04/2020  ? THYROID LOBECTOMY Right 1979  ? TOTAL HIP ARTHROPLASTY Right 01/28/2016  ? Procedure: RIGHT TOTAL HIP ARTHROPLASTY ANTERIOR APPROACH;  Surgeon: [name], MD;  Location: [facility];  Service: Orthopedics;  Laterality: Right;  ? UMBILICAL HERNIA REPAIR  1980s  ? VIDEO BRONCHOSCOPY WITH ENDOBRONCHIAL NAVIGATION N/A 06/06/2020  ? Procedure: VIDEO BRONCHOSCOPY WITH ENDOBRONCHIAL NAVIGATION;  Surgeon: Collene Gobble, MD;  Location: [facility];  Service: Thoracic;  Laterality: N/A;  ? VIDEO BRONCHOSCOPY WITH ENDOBRONCHIAL  NAVIGATION N/A 10/10/2020  ? Procedure: VIDEO BRONCHOSCOPY WITH ENDOBRONCHIAL NAVIGATION;  Surgeon: [name], MD;  Location: [place];  Service: Thoracic;  Laterality: N/A;  ? ?Patient Active Problem List  ? Diagnosis Date Noted  ? Genetic testing 06/02/2021  ? Family history of breast cancer 05/04/2021  ? Family history of ovarian cancer 05/04/2021  ? Family history of colon cancer 05/04/2021  ? Vitamin D deficiency 03/20/2021  ? Primary cancer of right upper lobe of lung ([place]) 10/21/2020  ? Port-A-Cath in place 06/13/2020  ? Pulmonary nodule 06/03/2020  ? Cancer of renal pelvis, unspecified laterality ([place]) 05/26/2020  ? Chronic kidney disease, stage 3a ([place]) 12/31/2019  ? Prediabetes 12/31/2019  ? Obesity (BMI 30-39.9) 12/31/2019  ? Major depression, recurrent, chronic ([place]) 10/24/2019  ? Leg edema 10/24/2019  ? Medication intolerance 10/24/2019  ? Benign microscopic hematuria 09/17/2019  ? Myalgia due to statin 11/23/2018  ? Umbilical hernia 36/14/4315  ? Sessile colonic polyp 10/04/2017  ? Tobacco dependence 12/09/2015  ? Status post right hip replacement 07/21/2015  ? ACE inhibitor intolerance 06/17/2015  ? DJD (degenerative joint disease), lumbar 06/17/2015  ? Essential hypertension 06/17/2015  ? Mixed hyperlipidemia 06/17/2015  ? Morbid obesity ([place]) 06/17/2015  ? Post-menopause on HRT (hormone replacement therapy) 06/17/2015  ? Postoperative hypothyroidism 06/17/2015  ? Primary osteoarthritis involving multiple joints 06/17/2015  ? ? ?REFERRING DIAG: Chronic bilateral low back pain without sciatica ? ?THERAPY DIAG:  ?Difficulty in walking, not elsewhere classified ? ?Chronic low back pain, unspecified back pain laterality, unspecified whether sciatica present ? ?Balance problem ? ?Muscle spasm of back ? ?Muscle weakness (generalized) ? ?PERTINENT HISTORY: HTN, Stage IIIa Chronic Kidney Disease, High-grade urothelial cancer and lung cancer ? ?PRECAUTIONS: none ? ?SUBJECTIVE: Pt states that she can  tell that she is getting stronger and has more stamina. ? ?PAIN:  ?Are you having pain? Yes ?NPRS scale: 3/10 ?Pain location: knees ?Pain orientation: Bilateral  ?PAIN TYPE: aching and tight ?Pain description: intermittent  ?Aggravating factors: steps ?Relieving factors: rest ? ? ?OBJECTIVE:  ?  ?PATIENT SURVEYS:  ?11/05/2021: ?Modified Oswestry Low Back Pain Disability Questionnaire: 24 / 50 = 48.0 % ? ?09/30/2021: ?Modified Oswestry 28 / 50 = 56.0 %  ?FOTO 48% ?  ?LUMBARAROM/PROM ?  ?A/PROM A/PROM  ?09/30/2021  ?Flexion 50  ?Extension 30  ?Right lateral flexion    ?Left lateral flexion    ?Right rotation    ?Left rotation    ? (Blank rows = not tested) ? ?LE MMT: ?  ?BLE strength grossly 4 to 4+/5 throughout on 09/30/2021. ?  ?LUMBAR SPECIAL TESTS:  ?Straight leg raise test: Positive ?  ?FUNCTIONAL TESTS:  ? ?10/14/21 ?BERG 43/56 ? ?09/30/2021 ?5 times sit to stand: 14.9 sec with limited UE use pushing up from thighs. ?Timed up and go (TUG): 11.8 sec ?  ?GAIT: ?Distance  walked: 150 ft ?Assistive device utilized: None ?Level of assistance: Complete Independence ?Comments: antalgic gait pattern noted. ?  ? TODAY'S TREATMENT  ?  ?11/05/2021: ?Nustep L5 x6 min with PT present to discuss status ?Seated:  LAQ, marching, hip abduction scissors.  2# 2x10 bilat ?Modified Oswestry Questionnaire 24 / 50 = 48.0 % ?Seated crunches with 5# weight 2x10 ?Seated core series holding 5# weight: hip to hip, shoulder to hip x10 each ?Modified dead lift holding 6# x10 reps ?Supine hamstring stretch with strap x20 sec bilat. ?Supine:  Straight leg raise, bridge, hooklying clamshell with blue loop 2x10 each bilat ?Lower trunk rotation 5 x 10 sec bilat ? ?11/03/2021: ?Nustep L5 x6 min with PT present to discuss status ?Hand to knee isometric push 5x each side ?Supine hamstring stretch with strap 2x20 sec bilat. ?Hooklying piriformis stretch 2x20 sec bilat. ?Lower trunk rotation 5 x 10 sec bilat ?Supine:  Straight leg raise, bridge, hooklying  clamshell with blue loop 2x10 each bilat ?Seated crunches with 5# weight 2x10 ?Seated core series holding 5# weight: hip to hip, shoulder to hip x10 each ? ?10/27/2021: ?Nustep L4 x6 min with PT present to discuss

## 2021-11-09 ENCOUNTER — Telehealth: Payer: Self-pay | Admitting: Pharmacist

## 2021-11-09 NOTE — Chronic Care Management (AMB) (Signed)
? ? ?Chronic Care Management ?Pharmacy Assistant  ? ?Name: Maria Marquez  MRN: [id number] DOB: [birth year] ? ?Maria Marquez is an 76 y.o. year old female who presents for hier initial CCM visit with the clinical pharmacist. ? ?Reason for Encounter: Chart prep for initial visit with Maria Marquez, clinical pharmacist on 11/16/2021 at 9:00 via the telephone.  ?  ?Conditions to be addressed/monitored: ?HTN, HLD, CKD Stage 3a, Depression, Hypothyroidism, and Osteoarthritis and Lung cancer ? ?Recent office visits:  ?09/28/2021 Maria Frohlich MD - Patient was seen for cramping of hands and additional issues. Started Flexeril 5 mg at bedtime and Meloxicam 7.5 mg daily. No follow up noted.  ? ?09/22/2021 Maria Frohlich MD - Patient was seen for Primary cancer of right upper lobe of lung and additional issues.  Patient started Ondansetron 4 mg every 8 hours prn. Discontinued Cipro.  Follow up in 6 months.  ? ?Recent consult visits:  ?10/13/2021 Maria Portela MD (oncology) - Patient was seen for Cancer of renal pelvis, unspecified laterality and an additional issue. No follow up noted.  ? ?09/02/2021 Maria Marquez - Patient was seen for Malignant neoplasm of right renal pelvis. No other notes.  ? ?07/23/2021 Maria Marquez (urology) - Patient was seen for Malignant neoplasm of right renal pelvis and an additional issues. No other notes.  ? ?07/07/2021 Maria Portela MD (oncology) - Patient was seen for Malignant neoplasm of unspecified part of unspecified bronchus or lung. No medication changes. Follow up in 3 months.  ? ?Hospital visits:  ?None ? ? ?Medications: ?Outpatient Encounter Medications as of 11/09/2021  ?Medication Sig Note  ? acetaminophen (TYLENOL) 500 MG tablet Take 500-1,500 mg by mouth every 8 (eight) hours as needed for moderate pain.   ? albuterol (VENTOLIN HFA) 108 (90 Base) MCG/ACT inhaler Inhale 1-2 puffs into the lungs every 6 (six) hours as needed for wheezing or shortness of  breath.   ? aspirin EC 81 MG tablet Take 81 mg by mouth daily. Swallow whole.   ? atorvastatin (LIPITOR) 20 MG tablet TAKE 1 TABLET (20 MG TOTAL) BY MOUTH DAILY. (Patient taking differently: Take 20 mg by mouth at bedtime.)   ? calcium carbonate (TUMS - DOSED IN MG ELEMENTAL CALCIUM) 500 MG chewable tablet Chew 500 mg by mouth daily as needed for indigestion or heartburn.   ? cyclobenzaprine (FLEXERIL) 5 MG tablet Take 1 tablet (5 mg total) by mouth at bedtime as needed for muscle spasms.   ? EPINEPHrine 0.3 mg/0.3 mL IJ SOAJ injection Inject 0.3 mg into the muscle as needed for anaphylaxis. 12/31/2020: Patient hasn't had to use it as of yet.  ? estradiol-norethindrone (ACTIVELLA) 1-0.5 MG tablet Take 1 tablet by mouth daily. (Patient taking differently: Take 1 tablet by mouth every other day. Takes in evening)   ? ezetimibe (ZETIA) 10 MG tablet TAKE 1 TABLET EVERY DAY (Patient taking differently: Take 10 mg by mouth at bedtime.)   ? levothyroxine (SYNTHROID) 150 MCG tablet Take 1 tablet (150 mcg total) by mouth daily.   ? lidocaine-prilocaine (EMLA) cream Apply 1 application topically as needed.   ? losartan-hydrochlorothiazide (HYZAAR) 50-12.5 MG tablet TAKE 1 TABLET EVERY DAY (Patient taking differently: Take 1 tablet by mouth daily.)   ? meloxicam (MOBIC) 7.5 MG tablet Take 1 tablet (7.5 mg total) by mouth daily.   ? ondansetron (ZOFRAN) 4 MG tablet Take 1 tablet (4 mg total) by mouth every 8 (eight) hours as needed for nausea or vomiting.   ?  oxybutynin (DITROPAN) 5 MG tablet Take 1 tablet (5 mg total) by mouth every 8 (eight) hours as needed for bladder spasms. (Patient not taking: Reported on 10/26/2021)   ? vitamin B-12 (CYANOCOBALAMIN) 1000 MCG tablet Take 1,000 mcg by mouth daily.   ? XIIDRA 5 % SOLN Place 1 drop into both eyes 2 (two) times daily as needed (dry eyes).   ? ?No facility-administered encounter medications on file as of 11/09/2021.  ?Fill History: ?albuterol sulfate HFA 90 mcg/actuation aerosol  inhaler 05/06/2021 25  ? ?atorvastatin 20 mg tablet 09/01/2021 90  ? ?CYCLOBENZAPRINE 5 MG TABLET 09/28/2021 30  ? ?estradiol-norethindrone acet 1 mg-0.5 mg tablet 10/23/2021 28  ? ?ezetimibe 10 mg tablet 08/28/2021 90  ? ?losartan 50 mg-hydrochlorothiazide 12.5 mg tablet 08/28/2021 90  ? ?MELOXICAM 7.5 MG TABLET 09/28/2021 10  ? ?oxybutynin (DITROPAN) tablet 5 mg 09/16/2021 10  ? ?Have you seen any other providers since your last visit? **Patient denies ? ?Any changes in your medications or health? Patient denies any changes  ? ?Any side effects from any medications? Patient denies any side effects. ? ?Do you have an symptoms or problems not managed by your medications? Patient states she continues to have back pain.  ? ?Any concerns about your health right now? Patient denies any concerns at this time ? ?Has your provider asked that you check blood pressure, blood sugar, or follow special diet at home? Patient states she checks her blood pressure on occasion.  ? ?Do you get any type of exercise on a regular basis? Patient does get exercise at rehab ? ?Can you think of a goal you would like to reach for your health? Patient would like to loose weight.  ? ?Do you have any problems getting your medications? Patient denies any issues getting medications.  ? ?Is there anything that you would like to discuss during the appointment? Patient states she would like to ask about having her heart checked due to age, she is not having any problems or symptoms.  ? ?Please bring medications and supplements to appointment ? ? ? ?Care Gaps: ?AWV - message sent to Ramond Craver ?Last BP - 141/68 on 10/13/2021 ?Last A1C - 6.2 on 03/19/2022 ?Colonoscopy - overdue ?Covid booster - overdue ? ?Star Rating Drugs: ?Atorvastatin 20 mg - last filled 09/01/2021 90 DS at [facility] ? ?Gennie Alma CMA  ?Clinical Pharmacist Assistant ?[phone] ? ?

## 2021-11-10 ENCOUNTER — Ambulatory Visit: Payer: Medicare HMO | Admitting: Rehabilitative and Restorative Service Providers"

## 2021-11-10 ENCOUNTER — Encounter: Payer: Self-pay | Admitting: Rehabilitative and Restorative Service Providers"

## 2021-11-10 ENCOUNTER — Other Ambulatory Visit: Payer: Self-pay

## 2021-11-10 DIAGNOSIS — R262 Difficulty in walking, not elsewhere classified: Secondary | ICD-10-CM

## 2021-11-10 DIAGNOSIS — M6281 Muscle weakness (generalized): Secondary | ICD-10-CM

## 2021-11-10 DIAGNOSIS — R2689 Other abnormalities of gait and mobility: Secondary | ICD-10-CM | POA: Diagnosis not present

## 2021-11-10 DIAGNOSIS — M545 Low back pain, unspecified: Secondary | ICD-10-CM | POA: Diagnosis not present

## 2021-11-10 DIAGNOSIS — G8929 Other chronic pain: Secondary | ICD-10-CM | POA: Diagnosis not present

## 2021-11-10 DIAGNOSIS — M62830 Muscle spasm of back: Secondary | ICD-10-CM | POA: Diagnosis not present

## 2021-11-10 NOTE — Therapy (Signed)
?OUTPATIENT PHYSICAL THERAPY TREATMENT NOTE ? ? ?Patient Name: Maria Marquez ?MRN: [id number] ?DOB:08/08/1946, 76 y.o., female ?Today's Date: 11/10/2021 ? ?PCP: [name], Rayford Halsted, MD ?REFERRING PROVIDER: [name], Estel* ? ? PT End of Session - 11/10/21 1150   ? ? Visit Number 10   ? Number of Visits 12   ? Date for PT Re-Evaluation 11/20/21   ? Authorization Type [employer] 12 visits   ? Authorization Time Period 09/30/21 to 11/20/21   ? Authorization - Visit Number 10   ? Authorization - Number of Visits 12   ? Progress Note Due on Visit 20   ? PT Start Time 1145   ? PT Stop Time 1225   ? PT Time Calculation (min) 40 min   ? Activity Tolerance Patient tolerated treatment well   ? Behavior During Therapy [facility] for tasks assessed/performed   ? ?  ?  ? ?  ? ? ? ?Past Medical History:  ?Diagnosis Date  ? Anemia associated with chemotherapy   ? followed by dr Alen Blew  ? Bilateral lower extremity edema   ? per pt occasionally ankles swell  ? Carcinoma of renal pelvis, right ([place])   ? urologist-- dr winter/  oncologist-- dr Alen Blew;   dx 08/ 2021 ;  chemo 10/ 2021  to 12/ 2021  ? Chronic kidney disease, stage 3a ([place]) 12/31/2019  ? Dyspnea   ? 12-26-2020  per pt no issue most of the time  ? Family history of breast cancer 05/04/2021  ? Family history of ovarian cancer 05/04/2021  ? GERD (gastroesophageal reflux disease)   ? per pt occasionally takes tums  ? History of basal cell carcinoma (BCC) excision   ? per pt in 1970s on nose  ? History of chemotherapy   ? 06-13-2020  to 12/ 2021  for right renal pelvis carcinoma  ? History of colon polyps   ? History of radiation therapy   ? 11-13-2020 to 11-26-2020---   Right Lung- SBRT- Dr. Gery Pray  ? HLD (hyperlipidemia)   ? Hypertension   ? followed by pcp  ? Hypothyroidism, postsurgical 1979  ? followed by pcp  ? Leukocytosis   ? Nocturia   ? OA (osteoarthritis)   ? PONV (postoperative nausea and vomiting)   ? Since chemo (always nauseous)  ? Pre-diabetes   ?  Primary adenocarcinoma of upper lobe of right lung ([place])   ? oncologist--- shadad/  pulmonology-- dr byrum;  dx 02/ 2022;  s/p  SBRT 11-13-2020 to 11-26-2020  ? Urgency of urination   ? Wears partial dentures   ? lower  ? ?Past Surgical History:  ?Procedure Laterality Date  ? COLONOSCOPY  last one 2017  ? CYSTOSCOPY WITH BIOPSY Right 09/16/2021  ? Procedure: CYSTOSCOPY / URETEROSCOPY WITH BIOPSY/bugbee fulgeration/right retrograde;  Surgeon: Ceasar Mons, MD;  [facility];  Service: Urology;  Laterality: Right;  ? CYSTOSCOPY WITH URETEROSCOPY Right 04/18/2020  ? Procedure: CYSTOSCOPY WITH URETEROSCOPY, BIOPSY;  Surgeon: Ceasar Mons, MD;  [facility];  Service: Urology;  Laterality: Right;  ONLY NEEDS 45 MIN  ? CYSTOSCOPY WITH URETEROSCOPY AND STENT PLACEMENT N/A 12/31/2020  ? Procedure: CYSTOSCOPY WITH RIGHT URETEROSCOPY/ BILATERAL RETROGRADE PYELOGRAM/RIGHT URETERAL BIOPSY/ LASER ABLATION/RIGHT STENT PLACEMENT;  Surgeon: Ceasar Mons, MD;  Location[facility];  Service: Urology;  Laterality: N/A;  ? CYSTOSCOPY/RETROGRADE/URETEROSCOPY Right 04/22/2021  ? Procedure: CYSTOSCOPY/RETROGRADE/URETEROSCOPY/ STENT PLACEMENT;  Surgeon: Ceasar Mons, MD;  Location: [facility];  Service: Urology;  Laterality: Right;  ? EYE SURGERY Bilateral 1987  ? tear ducts  ? IR IMAGING GUIDED PORT INSERTION  06/04/2020  ? THYROID LOBECTOMY Right 1979  ? TOTAL HIP ARTHROPLASTY Right 01/28/2016  ? Procedure: RIGHT TOTAL HIP ARTHROPLASTY ANTERIOR APPROACH;  Surgeon: [name], MD;  [facility];  Service: Orthopedics;  Laterality: Right;  ? UMBILICAL HERNIA REPAIR  1980s  ? VIDEO BRONCHOSCOPY WITH ENDOBRONCHIAL NAVIGATION N/A 06/06/2020  ? Procedure: VIDEO BRONCHOSCOPY WITH ENDOBRONCHIAL NAVIGATION;  Surgeon: Collene Gobble, MD;  Location[facility];  Service: Thoracic;  Laterality: N/A;  ? VIDEO BRONCHOSCOPY WITH ENDOBRONCHIAL  NAVIGATION N/A 10/10/2020  ? Procedure: VIDEO BRONCHOSCOPY WITH ENDOBRONCHIAL NAVIGATION;  Surgeon: [name], MD;  Location: [place];  Service: Thoracic;  Laterality: N/A;  ? ?Patient Active Problem List  ? Diagnosis Date Noted  ? Genetic testing 06/02/2021  ? Family history of breast cancer 05/04/2021  ? Family history of ovarian cancer 05/04/2021  ? Family history of colon cancer 05/04/2021  ? Vitamin D deficiency 03/20/2021  ? Primary cancer of right upper lobe of lung ([place]) 10/21/2020  ? Port-A-Cath in place 06/13/2020  ? Pulmonary nodule 06/03/2020  ? Cancer of renal pelvis, unspecified laterality ([place]) 05/26/2020  ? Chronic kidney disease, stage 3a ([place]) 12/31/2019  ? Prediabetes 12/31/2019  ? Obesity (BMI 30-39.9) 12/31/2019  ? Major depression, recurrent, chronic ([place]) 10/24/2019  ? Leg edema 10/24/2019  ? Medication intolerance 10/24/2019  ? Benign microscopic hematuria 09/17/2019  ? Myalgia due to statin 11/23/2018  ? Umbilical hernia 87/56/4332  ? Sessile colonic polyp 10/04/2017  ? Tobacco dependence 12/09/2015  ? Status post right hip replacement 07/21/2015  ? ACE inhibitor intolerance 06/17/2015  ? DJD (degenerative joint disease), lumbar 06/17/2015  ? Essential hypertension 06/17/2015  ? Mixed hyperlipidemia 06/17/2015  ? Morbid obesity ([place]) 06/17/2015  ? Post-menopause on HRT (hormone replacement therapy) 06/17/2015  ? Postoperative hypothyroidism 06/17/2015  ? Primary osteoarthritis involving multiple joints 06/17/2015  ? ? ?REFERRING DIAG: Chronic bilateral low back pain without sciatica ? ?THERAPY DIAG:  ?Difficulty in walking, not elsewhere classified ? ?Chronic low back pain, unspecified back pain laterality, unspecified whether sciatica present ? ?Balance problem ? ?Muscle spasm of back ? ?Muscle weakness (generalized) ? ?PERTINENT HISTORY: HTN, Stage IIIa Chronic Kidney Disease, High-grade urothelial cancer and lung cancer ? ?PRECAUTIONS: none ? ?SUBJECTIVE: Pt reports that she  thinks that she is sleeping a lot better.  States that she is having some back pain this AM. ? ?PAIN:  ?Are you having pain? Yes ?NPRS scale: 5/10 ?Pain location: back ?Pain orientation: Lower  ?PAIN TYPE: aching and tight ?Pain description: intermittent  ?Aggravating factors: steps ?Relieving factors: rest ? ? ?OBJECTIVE:  ?  ?PATIENT SURVEYS:  ? ?11/10/2021: ?FOTO 58% ? ?11/05/2021: ?Modified Oswestry Low Back Pain Disability Questionnaire: 24 / 50 = 48.0 % ? ?09/30/2021: ?Modified Oswestry 28 / 50 = 56.0 %  ?FOTO 48% ?  ?LUMBARAROM/PROM ?  ?A/PROM A/PROM  ?09/30/2021  ?Flexion 50  ?Extension 30  ?Right lateral flexion    ?Left lateral flexion    ?Right rotation    ?Left rotation    ? (Blank rows = not tested) ? ?LE MMT: ?  ?BLE strength grossly 4 to 4+/5 throughout on 09/30/2021. ?  ?LUMBAR SPECIAL TESTS:  ?Straight leg raise test: Positive ?  ?FUNCTIONAL TESTS:  ? ?10/14/21 ?BERG 43/56 ? ?09/30/2021 ?5 times sit to stand: 14.9 sec with limited UE use pushing up  from thighs. ?Timed up and go (TUG): 11.8 sec ?  ?GAIT: ?Distance walked: 150 ft ?Assistive device utilized: None ?Level of assistance: Complete Independence ?Comments: antalgic gait pattern noted. ?  ? TODAY'S TREATMENT  ?  ?11/10/2021: ?Nustep L5 x6 min with PT present to discuss status ?3 way pball roll out 5 x 10 sec hold ?Seated:  LAQ, marching, hip abduction scissors.  2# 2x10 bilat ?Seated crunches with 5# weight 2x10 ?Seated core series holding 5# weight: hip to hip, shoulder to hip x10 each ?Seated hamstring stretch 2x20 sec bilat ?Standing gastroc stretch 2x20 sec bilat on rocker board ? ?11/05/2021: ?Nustep L5 x6 min with PT present to discuss status ?Seated:  LAQ, marching, hip abduction scissors.  2# 2x10 bilat ?Modified Oswestry Questionnaire 24 / 50 = 48.0 % ?Seated crunches with 5# weight 2x10 ?Seated core series holding 5# weight: hip to hip, shoulder to hip x10 each ?Modified dead lift holding 6# x10 reps ?Supine hamstring stretch with strap x20 sec  bilat. ?Supine:  Straight leg raise, bridge, hooklying clamshell with blue loop 2x10 each bilat ?Lower trunk rotation 5 x 10 sec bilat ? ?11/03/2021: ?Nustep L5 x6 min with PT present to discuss status ?Lamonte Sakai

## 2021-11-12 ENCOUNTER — Other Ambulatory Visit: Payer: Medicare HMO

## 2021-11-12 ENCOUNTER — Other Ambulatory Visit: Payer: Self-pay

## 2021-11-12 ENCOUNTER — Encounter: Payer: Self-pay | Admitting: Rehabilitative and Restorative Service Providers"

## 2021-11-12 ENCOUNTER — Ambulatory Visit: Payer: Medicare HMO | Admitting: Rehabilitative and Restorative Service Providers"

## 2021-11-12 DIAGNOSIS — M62830 Muscle spasm of back: Secondary | ICD-10-CM | POA: Diagnosis not present

## 2021-11-12 DIAGNOSIS — M6281 Muscle weakness (generalized): Secondary | ICD-10-CM | POA: Diagnosis not present

## 2021-11-12 DIAGNOSIS — R262 Difficulty in walking, not elsewhere classified: Secondary | ICD-10-CM | POA: Diagnosis not present

## 2021-11-12 DIAGNOSIS — M545 Low back pain, unspecified: Secondary | ICD-10-CM

## 2021-11-12 DIAGNOSIS — R2689 Other abnormalities of gait and mobility: Secondary | ICD-10-CM | POA: Diagnosis not present

## 2021-11-12 DIAGNOSIS — G8929 Other chronic pain: Secondary | ICD-10-CM | POA: Diagnosis not present

## 2021-11-12 NOTE — Therapy (Signed)
?OUTPATIENT PHYSICAL THERAPY TREATMENT NOTE ? ? ?Patient Name: Maria Marquez ?MRN: [id number] ?DOB:[birth year], 76 y.o., female ?Today's Date: 11/12/2021 ? ?PCP: [name], Rayford Halsted, MD ?REFERRING PROVIDER: [name], Est[place]* ? ? PT End of Session - 11/12/21 1152   ? ? Visit Number 11   ? Number of Visits 12   ? Date for PT Re-Evaluation 11/20/21   ? Authorization Type [employer] 12 visits   ? Authorization Time Period 09/30/21 to 11/20/21   ? Authorization - Visit Number 11   ? Authorization - Number of Visits 12   ? Progress Note Due on Visit 20   ? PT Start Time 1144   ? PT Stop Time 1223   ? PT Time Calculation (min) 39 min   ? Activity Tolerance Patient tolerated treatment well   ? Behavior During Therapy [facility] for tasks assessed/performed   ? ?  ?  ? ?  ? ? ? ?Past Medical History:  ?Diagnosis Date  ? Anemia associated with chemotherapy   ? followed by dr Alen Blew  ? Bilateral lower extremity edema   ? per pt occasionally ankles swell  ? Carcinoma of renal pelvis, right ([place])   ? urologist-- dr winter/  oncologist-- dr Alen Blew;   dx 08/ 2021 ;  chemo 10/ 2021  to 12/ 2021  ? Chronic kidney disease, stage 3a ([place]) 12/31/2019  ? Dyspnea   ? 12-26-2020  per pt no issue most of the time  ? Family history of breast cancer 05/04/2021  ? Family history of ovarian cancer 05/04/2021  ? GERD (gastroesophageal reflux disease)   ? per pt occasionally takes tums  ? History of basal cell carcinoma (BCC) excision   ? per pt in 1970s on nose  ? History of chemotherapy   ? 06-13-2020  to 12/ 2021  for right renal pelvis carcinoma  ? History of colon polyps   ? History of radiation therapy   ? 11-13-2020 to 11-26-2020---   Right Lung- SBRT- Dr. Gery Pray  ? HLD (hyperlipidemia)   ? Hypertension   ? followed by pcp  ? Hypothyroidism, postsurgical 1979  ? followed by pcp  ? Leukocytosis   ? Nocturia   ? OA (osteoarthritis)   ? PONV (postoperative nausea and vomiting)   ? Since chemo (always nauseous)  ? Pre-diabetes   ?  Primary adenocarcinoma of upper lobe of right lung ([place])   ? oncologist--- shadad/  pulmonology-- dr byrum;  dx 02/ 2022;  s/p  SBRT 11-13-2020 to 11-26-2020  ? Urgency of urination   ? Wears partial dentures   ? lower  ? ?Past Surgical History:  ?Procedure Laterality Date  ? COLONOSCOPY  last one 2017  ? CYSTOSCOPY WITH BIOPSY Right 09/16/2021  ? Procedure: CYSTOSCOPY / URETEROSCOPY WITH BIOPSY/bugbee fulgeration/right retrograde;  Surgeon: Ceasar Mons, MD;  Location: [facility];  Service: Urology;  Laterality: Right;  ? CYSTOSCOPY WITH URETEROSCOPY Right 04/18/2020  ? Procedure: CYSTOSCOPY WITH URETEROSCOPY, BIOPSY;  Surgeon: Ceasar Mons, MD;  Location: [facility];  Service: Urology;  Laterality: Right;  ONLY NEEDS 45 MIN  ? CYSTOSCOPY WITH URETEROSCOPY AND STENT PLACEMENT N/A 12/31/2020  ? Procedure: CYSTOSCOPY WITH RIGHT URETEROSCOPY/ BILATERAL RETROGRADE PYELOGRAM/RIGHT URETERAL BIOPSY/ LASER ABLATION/RIGHT STENT PLACEMENT;  Surgeon: Ceasar Mons, MD;  Location: [facility];  Service: Urology;  Laterality: N/A;  ? CYSTOSCOPY/RETROGRADE/URETEROSCOPY Right 04/22/2021  ? Procedure: CYSTOSCOPY/RETROGRADE/URETEROSCOPY/ STENT PLACEMENT;  Surgeon: Ceasar Mons, MD;  Location: [facility];  Service: Urology;  Laterality: Right;  ? EYE SURGERY Bilateral 1987  ? tear ducts  ? IR IMAGING GUIDED PORT INSERTION  06/04/2020  ? THYROID LOBECTOMY Right 1979  ? TOTAL HIP ARTHROPLASTY Right 01/28/2016  ? Procedure: RIGHT TOTAL HIP ARTHROPLASTY ANTERIOR APPROACH;  Surgeon: [name], MD;  Location: [facility];  Service: Orthopedics;  Laterality: Right;  ? UMBILICAL HERNIA REPAIR  1980s  ? VIDEO BRONCHOSCOPY WITH ENDOBRONCHIAL NAVIGATION N/A 06/06/2020  ? Procedure: VIDEO BRONCHOSCOPY WITH ENDOBRONCHIAL NAVIGATION;  Surgeon: Collene Gobble, MD;  Location: [facility];  Service: Thoracic;  Laterality: N/A;  ? VIDEO BRONCHOSCOPY WITH ENDOBRONCHIAL  NAVIGATION N/A 10/10/2020  ? Procedure: VIDEO BRONCHOSCOPY WITH ENDOBRONCHIAL NAVIGATION;  Surgeon: [name], MD;  Location: [place];  Service: Thoracic;  Laterality: N/A;  ? ?Patient Active Problem List  ? Diagnosis Date Noted  ? Genetic testing 06/02/2021  ? Family history of breast cancer 05/04/2021  ? Family history of ovarian cancer 05/04/2021  ? Family history of colon cancer 05/04/2021  ? Vitamin D deficiency 03/20/2021  ? Primary cancer of right upper lobe of lung ([place]) 10/21/2020  ? Port-A-Cath in place 06/13/2020  ? Pulmonary nodule 06/03/2020  ? Cancer of renal pelvis, unspecified laterality ([place]) 05/26/2020  ? Chronic kidney disease, stage 3a ([place]) 12/31/2019  ? Prediabetes 12/31/2019  ? Obesity (BMI 30-39.9) 12/31/2019  ? Major depression, recurrent, chronic ([place]) 10/24/2019  ? Leg edema 10/24/2019  ? Medication intolerance 10/24/2019  ? Benign microscopic hematuria 09/17/2019  ? Myalgia due to statin 11/23/2018  ? Umbilical hernia 96/11/5407  ? Sessile colonic polyp 10/04/2017  ? Tobacco dependence 12/09/2015  ? Status post right hip replacement 07/21/2015  ? ACE inhibitor intolerance 06/17/2015  ? DJD (degenerative joint disease), lumbar 06/17/2015  ? Essential hypertension 06/17/2015  ? Mixed hyperlipidemia 06/17/2015  ? Morbid obesity ([place]) 06/17/2015  ? Post-menopause on HRT (hormone replacement therapy) 06/17/2015  ? Postoperative hypothyroidism 06/17/2015  ? Primary osteoarthritis involving multiple joints 06/17/2015  ? ? ?REFERRING DIAG: Chronic bilateral low back pain without sciatica ? ?THERAPY DIAG:  ?Difficulty in walking, not elsewhere classified ? ?Chronic low back pain, unspecified back pain laterality, unspecified whether sciatica present ? ?Balance problem ? ?Muscle spasm of back ? ?Muscle weakness (generalized) ? ?PERTINENT HISTORY: HTN, Stage IIIa Chronic Kidney Disease, High-grade urothelial cancer and lung cancer ? ?PRECAUTIONS: none ? ?SUBJECTIVE: Pt reports having some  knee pain yesterday, but reports that her back pain has improved. ? ?PAIN:  ?Are you having pain? Yes ?NPRS scale: 4-5/10 ?Pain location: knee ?Pain orientation: Lower  ?PAIN TYPE: aching and tight ?Pain description: intermittent  ?Aggravating factors: steps ?Relieving factors: rest ? ? ?OBJECTIVE:  ?  ?PATIENT SURVEYS:  ? ?11/10/2021: ?FOTO 58% ? ?11/05/2021: ?Modified Oswestry Low Back Pain Disability Questionnaire: 24 / 50 = 48.0 % ? ?09/30/2021: ?Modified Oswestry 28 / 50 = 56.0 %  ?FOTO 48% ?  ?LUMBARAROM/PROM ?  ?A/PROM A/PROM  ?09/30/2021  ?Flexion 50  ?Extension 30  ?Right lateral flexion    ?Left lateral flexion    ?Right rotation    ?Left rotation    ? (Blank rows = not tested) ? ?LE MMT: ?  ?BLE strength grossly 4 to 4+/5 throughout on 09/30/2021. ?  ?LUMBAR SPECIAL TESTS:  ?Straight leg raise test: Positive ?  ?FUNCTIONAL TESTS:  ? ?10/14/21 ?BERG 43/56 ? ?09/30/2021 ?5 times sit to stand: 14.9 sec with limited UE use pushing up from thighs. ?Timed up and go (TUG): 11.8  sec ?  ?GAIT: ?Distance walked: 150 ft ?Assistive device utilized: None ?Level of assistance: Complete Independence ?Comments: antalgic gait pattern noted. ?  ? TODAY'S TREATMENT  ?  ?11/12/2021: ?Nustep L5 x6 min with PT present to discuss status ?3 way pball roll out 5 x 10 sec hold ?Seated:  LAQ, marching, hip abduction scissors.  2# 2x10 bilat ?Seated hamstring stretch 2x20 sec bilat ?Seated core series holding 5# weight: hip to hip, shoulder to hip x10 each ?Seated crunches with 5# weight 2x10 ?4 way pelvic tilt on dynadisc x10 reps ?Ab set on dynadisc 5 sec hold x10 reps ?Standing gastroc stretch 2x20 sec bilat on rocker board ? ?11/10/2021: ?Nustep L5 x6 min with PT present to discuss status ?3 way pball roll out 5 x 10 sec hold ?Seated:  LAQ, marching, hip abduction scissors.  2# 2x10 bilat ?Seated crunches with 5# weight 2x10 ?Seated core series holding 5# weight: hip to hip, shoulder to hip x10 each ?Seated hamstring stretch 2x20 sec  bilat ?Standing gastroc stretch 2x20 sec bilat on rocker board ? ?11/05/2021: ?Nustep L5 x6 min with PT present to discuss status ?Seated:  LAQ, marching, hip abduction scissors.  2# 2x10 bilat ?Modified Oswes

## 2021-11-13 NOTE — Progress Notes (Deleted)
? ?Chronic Care Management ?Pharmacy Note ? ?11/13/2021 ?Name:  Maria Marquez MRN:  [id number] DOB:  Dec 09, 1945 ? ?Summary: ?*** ? ?Recommendations/Changes made from today's visit: ?*** ? ?Plan: ?*** ? ? ?Subjective: ?Maria Marquez is an 76 y.o. year old female who is a primary patient of [name], Rayford Halsted, MD.  The CCM team was consulted for assistance with disease management and care coordination needs.   ? ?Engaged with patient by telephone for initial visit in response to provider referral for pharmacy case management and/or care coordination services.  ? ?Consent to Services:  ?The patient was given the following information about Chronic Care Management services today, agreed to services, and gave verbal consent: 1. CCM service includes personalized support from designated clinical staff supervised by the primary care provider, including individualized plan of care and coordination with other care providers 2. 24/7 contact phone numbers for assistance for urgent and routine care needs. 3. Service will only be billed when office clinical staff spend 20 minutes or more in a month to coordinate care. 4. Only one practitioner may furnish and bill the service in a calendar month. 5.The patient may stop CCM services at any time (effective at the end of the month) by phone call to the office staff. 6. The patient will be responsible for cost sharing (co-pay) of up to 20% of the service fee (after annual deductible is met). Patient agreed to services and consent obtained. ? ?Patient Care Team: ?[name], Rayford Halsted, MD as PCP - General (Internal Medicine) ?[name], MD as Consulting Physician (Gastroenterology) ?[name] as Consulting Physician (Surgery) ?[name], MD as Consulting Physician (Orthopedic Surgery) ?[name], MD as Referring Physician (Allergy) ?[name], MD as Consulting Physician (Ophthalmology) ?[name], PsyD as Counselor  (Psychology) ?Bjorn Loser, MD as Consulting Physician (Urology) ?[name], [facility] as Pharmacist (Pharmacist) ? ?Recent office visits: ?09/28/2021 [name] MD - Patient was seen for cramping of hands and additional issues. Started Flexeril 5 mg at bedtime and Meloxicam 7.5 mg daily. No follow up noted.  ?  ?09/22/2021 [name] MD - Patient was seen for Primary cancer of right upper lobe of lung and additional issues.  Patient started Ondansetron 4 mg every 8 hours prn. Discontinued Cipro.  Follow up in 6 months.  ? ?Recent consult visits: ?11/12/21 [name], PT (outpatient rehab): Patient presented for PT session. ? ?10/13/2021 [name] MD (oncology) - Patient was seen for Cancer of renal pelvis, unspecified laterality and an additional issue. No follow up noted.  ?  ?09/02/2021 [name] - Patient was seen for Malignant neoplasm of right renal pelvis. No other notes.  ?  ?07/23/2021 [name] (urology) - Patient was seen for Malignant neoplasm of right renal pelvis and an additional issues. No other notes.  ?  ?07/07/2021 [name] MD (oncology) - Patient was seen for Malignant neoplasm of unspecified part of unspecified bronchus or lung. No medication changes. Follow up in 3 months. ? ?Hospital visits: ?None in previous 6 months ? ? ?Objective: ? ?Lab Results  ?Component Value Date  ? CREATININE 1.45 (H) 10/06/2021  ? BUN 31 (H) 10/06/2021  ? GFR 33.78 (L) 09/28/2021  ? GFRNONAA 38 (L) 10/06/2021  ? GFRAA 51 (L) 04/15/2020  ? NA 135 10/06/2021  ? K 3.8 10/06/2021  ? CALCIUM 9.2 10/06/2021  ? CO2 25 10/06/2021  ? GLUCOSE 124 (H) 10/06/2021  ? ? ?Lab Results  ?Component Value Date/Time  ?  HGBA1C 6.2 03/19/2021 03:14 PM  ? HGBA1C 4.9 10/09/2020 09:48 AM  ? HGBA1C 6.1 09/23/2015 12:00 AM  ? GFR 33.78 (L) 09/28/2021 04:30 PM  ? GFR 34.17 (L) 03/19/2021 03:14 PM  ?  ?Last diabetic Eye exam: No results found for: HMDIABEYEEXA  ?Last diabetic Foot exam: No  results found for: HMDIABFOOTEX  ? ?Lab Results  ?Component Value Date  ? CHOL 240 (H) 03/19/2021  ? HDL 47.50 03/19/2021  ? LDLCALC 127 (H) 12/19/2019  ? LDLDIRECT 165.0 03/19/2021  ? TRIG 205.0 (H) 03/19/2021  ? CHOLHDL 5 03/19/2021  ? ? ? ?  Latest Ref Rng & Units 10/06/2021  ? 10:15 AM 09/28/2021  ?  4:30 PM 07/03/2021  ? 12:06 PM  ?Hepatic Function  ?Total Protein 6.5 - 8.1 g/dL 7.0   7.5   6.8    ?Albumin 3.5 - 5.0 g/dL 3.7   4.1   3.6    ?AST 15 - 41 U/L 18   17   12     ?ALT 0 - 44 U/L 22   20   13     ?Alk Phosphatase 38 - 126 U/L 71   100   93    ?Total Bilirubin 0.3 - 1.2 mg/dL 0.6   0.8   0.7    ? ? ?Lab Results  ?Component Value Date/Time  ? TSH 7.26 (H) 09/28/2021 04:30 PM  ? TSH 3.47 03/19/2021 03:14 PM  ? FREET4 1.45 11/01/2018 01:06 PM  ? FREET4 1.83 (H) 06/13/2018 08:43 AM  ? ? ? ?  Latest Ref Rng & Units 10/06/2021  ? 10:15 AM 09/16/2021  ?  6:25 AM 07/03/2021  ? 12:06 PM  ?CBC  ?WBC 4.0 - 10.5 K/uL 8.2    7.4    ?Hemoglobin 12.0 - 15.0 g/dL 12.3   13.6   12.1    ?Hematocrit 36.0 - 46.0 % 36.4   40.0   36.5    ?Platelets 150 - 400 K/uL 266    217    ? ? ?Lab Results  ?Component Value Date/Time  ? VD25OH 30.56 03/19/2021 03:14 PM  ? VD25OH 47.5 12/19/2019 11:32 AM  ? VD25OH 42.6 11/01/2018 01:06 PM  ? ? ?Clinical ASCVD: {YES/NO:21197} ?The 10-year ASCVD risk score (Arnett DK, et al., 2019) is: 17.9% ?  Values used to calculate the score: ?    Age: 58 years ?    Sex: Female ?    Is Non-Hispanic African American: No ?    Diabetic: No ?    Tobacco smoker: No ?    Systolic Blood Pressure: 494 mmHg ?    Is BP treated: Yes ?    HDL Cholesterol: 47.5 mg/dL ?    Total Cholesterol: 240 mg/dL   ? ? ?  09/22/2021  ?  1:39 PM 03/19/2021  ?  2:35 PM 12/31/2019  ?  3:11 PM  ?Depression screen PHQ 2/9  ?Decreased Interest 0 0 0  ?Down, Depressed, Hopeless 0 1 0  ?PHQ - 2 Score 0 1 0  ?Altered sleeping 2 0   ?Tired, decreased energy 2 1   ?Change in appetite 2 1   ?Feeling bad or failure about yourself  0 0   ?Trouble  concentrating 0 1   ?Moving slowly or fidgety/restless 0 0   ?Suicidal thoughts 0 0   ?PHQ-9 Score 6 4   ?Difficult doing work/chores Somewhat difficult Not difficult at all   ?  ? ?***Other: (CHADS2VASc if Afib, MMRC or CAT  for COPD, ACT, DEXA) ? ?Social History  ? ?Tobacco Use  ?Smoking Status Former  ? Packs/day: 1.00  ? Years: 45.00  ? Pack years: 45.00  ? Types: Cigarettes  ? Quit date: 04/18/2020  ? Years since quitting: 1.5  ?Smokeless Tobacco Never  ? ?BP Readings from Last 3 Encounters:  ?10/26/21 128/83  ?10/13/21 (!) 141/68  ?09/28/21 136/70  ? ?Pulse Readings from Last 3 Encounters:  ?10/26/21 83  ?10/13/21 85  ?09/28/21 83  ? ?Wt Readings from Last 3 Encounters:  ?10/26/21 274 lb 6 oz (124.5 kg)  ?10/13/21 273 lb 3.2 oz (123.9 kg)  ?09/28/21 273 lb 3.2 oz (123.9 kg)  ? ?BMI Readings from Last 3 Encounters:  ?10/26/21 44.29 kg/m?  ?10/13/21 44.10 kg/m?  ?09/28/21 44.10 kg/m?  ? ? ?Assessment/Interventions: Review of patient past medical history, allergies, medications, health status, including review of consultants reports, laboratory and other test data, was performed as part of comprehensive evaluation and provision of chronic care management services.  ? ?SDOH:  (Social Determinants of Health) assessments and interventions performed: {yes/no:20286} ? ?SDOH Screenings  ? ?Alcohol Screen: Not on file  ?Depression (PHQ2-9): Medium Risk  ? PHQ-2 Score: 6  ?Financial Resource Strain: Low Risk   ? Difficulty of Paying Living Expenses: Not hard at all  ?Food Insecurity: No Food Insecurity  ? Worried About Charity fundraiser in the Last Year: Never true  ? Ran Out of Food in the Last Year: Never true  ?Housing: Low Risk   ? Last Housing Risk Score: 0  ?Physical Activity: Unknown  ? Days of Exercise per Week: 0 days  ? Minutes of Exercise per Session: Not on file  ?Social Connections: Socially Isolated  ? Frequency of Communication with Friends and Family: Twice a week  ? Frequency of Social Gatherings with  Friends and Family: Once a week  ? Attends Religious Services: Never  ? Active Member of Clubs or Organizations: No  ? Attends Archivist Meetings: Not on file  ? Marital Status: Widowed  ?Stress: No Stress Concer

## 2021-11-16 ENCOUNTER — Other Ambulatory Visit: Payer: Self-pay | Admitting: Internal Medicine

## 2021-11-16 ENCOUNTER — Ambulatory Visit (INDEPENDENT_AMBULATORY_CARE_PROVIDER_SITE_OTHER): Payer: Medicare HMO | Admitting: Pharmacist

## 2021-11-16 DIAGNOSIS — E782 Mixed hyperlipidemia: Secondary | ICD-10-CM

## 2021-11-16 DIAGNOSIS — C659 Malignant neoplasm of unspecified renal pelvis: Secondary | ICD-10-CM

## 2021-11-16 DIAGNOSIS — I10 Essential (primary) hypertension: Secondary | ICD-10-CM

## 2021-11-16 NOTE — Patient Instructions (Addendum)
Hi Maria Marquez, ? ?It was great to get to meet you over the telephone! Below is a summary of some of the topics we discussed.  ? ?Don't forget to: ?Bring your blood pressure cuff to your next office visit to make sure it's accurate ?Try Kegel exercises to help strengthen your bladder muscle and mention Myrbetriq or Gemtesa to your urologist about trying - these medications tend to have less side effects than Oxybutynin ?Can try famotidine (Pepcid) as needed for heartburn ?Try to get an updated COVID booster from the pharmacy ? ?Please reach out to me if you have any questions or need anything ! ? ?Best, ?Maddie ? ?Jeni Salles, PharmD, BCACP ?Clinical Pharmacist ?Therapist, music at [place] ?[phone] ? ? Visit Information ? ? Goals Addressed   ?None ?  ? ?Patient Care Plan: Maria Marquez  ?  ? ?Problem Identified: Problem: Hypertension, Hyperlipidemia, Chronic Kidney Disease, Hypothyroidism, Osteoarthritis, Tobacco use, and Overactive Bladder   ?  ? ?Long-Range Goal: Patient-Specific Goal   ?Start Date: 11/16/2021  ?Expected End Date: 11/17/2022  ?This Visit's Progress: On track  ?Priority: High  ?Note:   ?Current Barriers:  ?Unable to independently monitor therapeutic efficacy ?Unable to achieve control of cholesterol  ? ?Pharmacist Clinical Goal(s):  ?Patient will achieve adherence to monitoring guidelines and medication adherence to achieve therapeutic efficacy ?achieve control of cholesterol as evidenced by next lipid panel  through collaboration with PharmD and provider.  ? ?Interventions: ?1:1 collaboration with [name], Rayford Halsted, MD regarding development and update of comprehensive plan of care as evidenced by provider attestation and co-signature ?Inter-disciplinary care team collaboration (see longitudinal plan of care) ?Comprehensive medication review performed; medication list updated in electronic medical record ? ?Hypertension (BP goal <140/90) ?-Not ideally controlled ?-Current  treatment: ?Losartan-HCTZ 50-12.5 mg 1 tablet daily - Appropriate, Query effective, Safe, Accessible ?-Medications previously tried: lisinopril (cough)  ?-Current home readings: 140-150s after eating; usually 120/80s; wonders about accuracy with cuff (wrist cuff)  ?-Current dietary habits: sometimes pays attention to salt intake; gets water retention with salt intake ?-Current exercise habits: PT exercises; limited with pain ?-Denies hypotensive/hypertensive symptoms ?-Educated on BP goals and benefits of medications for prevention of heart attack, stroke and kidney damage; ?Importance of home blood pressure monitoring; ?Proper BP monitoring technique; ?-Counseled to monitor BP at home weekly, document, and provide log at future appointments ?-Counseled on diet and exercise extensively ?Recommended to continue current medication ? ?Hyperlipidemia: (LDL goal < 100) ?-Uncontrolled ?-Current treatment: ?Atorvastatin 20 mg 1 tablet daily - Appropriate, Query effective, Safe, Accessible ?Ezetimibe 10 mg 1 tablet daily - Appropriate, Query effective, Safe, Accessible ?-Medications previously tried: rosuvastatin (muscle pain)  ?-Current dietary patterns: not eating red meat; fried foods occasionally; cooks with butter or olive oil; goes through spurts of fiber in diet vs not ?-Current exercise habits: PT exercises; limited with back pain ?-Educated on Cholesterol goals;  ?Benefits of statin for ASCVD risk reduction; ?Importance of limiting foods high in cholesterol; ?-Counseled on diet and exercise extensively ?Recommended to continue current medication ? ? ?Overactive bladder (Goal: minimize symptoms) ?-Uncontrolled ?-Current treatment  ?Oxybutynin 5 mg 1 tablet every 8 hours as needed - not taking ?-Medications previously tried: none  ?-Recommended discussing with urologist about Myrbetriq or Gemtesa due to lessen chances of side effects.  ?Recommended Kegel exercises. ? ?Hypothyroidism (Goal: TSH  0.35-4.5) ?-Uncontrolled ?-Current treatment  ?Levothyroxine 150 mcg 1 tablet daily - Appropriate, Query effective, Safe, Accessible ?-Medications previously tried: none  ?-Counseled on importance of  taking on empty stomach.  ? ?Post menopausal symptoms (Goal: minimize symptoms) ?-Controlled ?-Current treatment  ?Estradiol-norethindrone 1-0.5 mg 1 tablet every other day  - Appropriate, Effective, Query Safe, Accessible ?-Medications previously tried: none  ?-Counseled on risks of long term risks with estrogen therapy. ? ?Pain (Goal: minimize pain) ?-Uncontrolled ?-Current treatment  ?No medications ?-Medications previously tried: meloxicam (not helpful), ibuprofen (using too long), acetaminophen (not helpful), cyclobenzaprine (not helpful)  ?-Recommended to continue current medication ? ? ?Health Maintenance ?-Vaccine gaps: COVID booster ?-Current therapy:  ?Albuterol HFA as needed ?Aspirin 81 mg 1 tablet daily ?Emla cream as needed ?Epipen as needed ?Viamin B12 1000 mcg daily ?Xiidra 5% solution 1 drop in both eyes as needed ?Tums as needed ?Ondansetron 4 mg 1 tablet as needed ?-Educated on Cost vs benefit of each product must be carefully weighed by individual consumer ?-Patient is satisfied with current therapy and denies issues ?-Counseled on non-pharmacologic management of symptoms such as elevating the head of your bed, avoiding eating 2-3 hours before bed, avoiding triggering foods such as acidic, spicy, or fatty foods, eating smaller meals, and wearing clothes that are loose around the waist ?Recommended trial of Pepcid (famotidine) for heartburn symptoms. ? ?Patient Goals/Self-Care Activities ?Patient will:  ?- take medications as prescribed as evidenced by patient report and record review ?check blood pressure at least weekly, document, and provide at future appointments ? ?Follow Up Plan: The care management team will reach out to the patient again over the next 7 days.  ? ?  ? ? ?Ms. Rennert was given  information about Chronic Care Management services today including:  ?CCM service includes personalized support from designated clinical staff supervised by her physician, including individualized plan of care and coordination with other care providers ?24/7 contact phone numbers for assistance for urgent and routine care needs. ?Standard insurance, coinsurance, copays and deductibles apply for chronic care management only during months in which we provide at least 20 minutes of these services. Most insurances cover these services at 100%, however patients may be responsible for any copay, coinsurance and/or deductible if applicable. This service may help you avoid the need for more expensive face-to-face services. ?Only one practitioner may furnish and bill the service in a calendar month. ?The patient may stop CCM services at any time (effective at the end of the month) by phone call to the office staff. ? ?Patient agreed to services and verbal consent obtained.  ? ?Patient verbalizes understanding of instructions and care plan provided today and agrees to view in [place]. Active MyChart status confirmed with patient.   ?The pharmacy team will reach out to the patient again over the next 7 days.  ? ?[name], RPH  ?

## 2021-11-16 NOTE — Progress Notes (Signed)
? ?Chronic Care Management ?Pharmacy Note ? ?11/16/2021 ?Name:  Maria Marquez MRN:  [id number] DOB:  1945/11/09 ? ?Summary: ?LDL not at goal < 100 ?TSH not at goal ? ?Recommendations/Changes made from today's visit: ?-Recommended bringing BP cuff to next office visit to ensure accuracy ?-Recommended Kegel exercises and discussing Myrbetriq or Gemtesa with urologist ?-Recommend stopping aspirin due to primary prevention ?-Recommended trial of Pepcid PRN for acid reflux symptoms ? ?Plan: ?Scheduled last visit for repeat TSH ?Follow up BP assessment in 2-3 months ? ? ?Subjective: ?Maria Marquez is an 76 y.o. year old female who is a primary patient of [name], Rayford Halsted, MD.  The CCM team was consulted for assistance with disease management and care coordination needs.   ? ?Engaged with patient by telephone for initial visit in response to provider referral for pharmacy case management and/or care coordination services.  ? ?Consent to Services:  ?The patient was given the following information about Chronic Care Management services today, agreed to services, and gave verbal consent: 1. CCM service includes personalized support from designated clinical staff supervised by the primary care provider, including individualized plan of care and coordination with other care providers 2. 24/7 contact phone numbers for assistance for urgent and routine care needs. 3. Service will only be billed when office clinical staff spend 20 minutes or more in a month to coordinate care. 4. Only one practitioner may furnish and bill the service in a calendar month. 5.The patient may stop CCM services at any time (effective at the end of the month) by phone call to the office staff. 6. The patient will be responsible for cost sharing (co-pay) of up to 20% of the service fee (after annual deductible is met). Patient agreed to services and consent obtained. ? ?Patient Care Team: ?[name], Rayford Halsted, MD as PCP - General  (Internal Medicine) ?[name], MD as Consulting Physician (Gastroenterology) ?[name] as Consulting Physician (Surgery) ?[name], MD as Consulting Physician (Orthopedic Surgery) ?[name], MD as Referring Physician (Allergy) ?[name], MD as Consulting Physician (Ophthalmology) ?[name], PsyD as Counselor (Psychology) ?Bjorn Loser, MD as Consulting Physician (Urology) ?[name], [facility] as Pharmacist (Pharmacist) ? ?Recent office visits: ?09/28/2021 [name] MD - Patient was seen for cramping of hands and additional issues. Started Flexeril 5 mg at bedtime and Meloxicam 7.5 mg daily. No follow up noted.  ?  ?09/22/2021 [name] MD - Patient was seen for Primary cancer of right upper lobe of lung and additional issues.  Patient started Ondansetron 4 mg every 8 hours prn. Discontinued Cipro.  Follow up in 6 months.  ? ?Recent consult visits: ?11/12/21 [name], PT (outpatient rehab): Patient presented for PT session. ? ?10/13/2021 [name] MD (oncology) - Patient was seen for Cancer of renal pelvis, unspecified laterality and an additional issue. No follow up noted.  ?  ?09/02/2021 [name] - Patient was seen for Malignant neoplasm of right renal pelvis. No other notes.  ?  ?07/23/2021 [name] (urology) - Patient was seen for Malignant neoplasm of right renal pelvis and an additional issues. No other notes.  ?  ?07/07/2021 [name] MD (oncology) - Patient was seen for Malignant neoplasm of unspecified part of unspecified bronchus or lung. No medication changes. Follow up in 3 months. ? ?Hospital visits: ?None in previous 6 months ? ? ?Objective: ? ?Lab Results  ?Component Value Date  ? CREATININE 1.45 (H) 10/06/2021  ?  BUN 31 (H) 10/06/2021  ? GFR 33.78 (L) 09/28/2021  ? GFRNONAA 38 (L) 10/06/2021  ? GFRAA 51 (L) 04/15/2020  ? NA 135 10/06/2021  ? K 3.8 10/06/2021  ? CALCIUM 9.2 10/06/2021  ? CO2  25 10/06/2021  ? GLUCOSE 124 (H) 10/06/2021  ? ? ?Lab Results  ?Component Value Date/Time  ? HGBA1C 6.2 03/19/2021 03:14 PM  ? HGBA1C 4.9 10/09/2020 09:48 AM  ? HGBA1C 6.1 09/23/2015 12:00 AM  ? GFR 33.78 (L) 09/28/2021 04:30 PM  ? GFR 34.17 (L) 03/19/2021 03:14 PM  ?  ?Last diabetic Eye exam: No results found for: HMDIABEYEEXA  ?Last diabetic Foot exam: No results found for: HMDIABFOOTEX  ? ?Lab Results  ?Component Value Date  ? CHOL 240 (H) 03/19/2021  ? HDL 47.50 03/19/2021  ? LDLCALC 127 (H) 12/19/2019  ? LDLDIRECT 165.0 03/19/2021  ? TRIG 205.0 (H) 03/19/2021  ? CHOLHDL 5 03/19/2021  ? ? ? ?  Latest Ref Rng & Units 10/06/2021  ? 10:15 AM 09/28/2021  ?  4:30 PM 07/03/2021  ? 12:06 PM  ?Hepatic Function  ?Total Protein 6.5 - 8.1 g/dL 7.0   7.5   6.8    ?Albumin 3.5 - 5.0 g/dL 3.7   4.1   3.6    ?AST 15 - 41 U/L 18   17   12     ?ALT 0 - 44 U/L 22   20   13     ?Alk Phosphatase 38 - 126 U/L 71   100   93    ?Total Bilirubin 0.3 - 1.2 mg/dL 0.6   0.8   0.7    ? ? ?Lab Results  ?Component Value Date/Time  ? TSH 7.26 (H) 09/28/2021 04:30 PM  ? TSH 3.47 03/19/2021 03:14 PM  ? FREET4 1.45 11/01/2018 01:06 PM  ? FREET4 1.83 (H) 06/13/2018 08:43 AM  ? ? ? ?  Latest Ref Rng & Units 10/06/2021  ? 10:15 AM 09/16/2021  ?  6:25 AM 07/03/2021  ? 12:06 PM  ?CBC  ?WBC 4.0 - 10.5 K/uL 8.2    7.4    ?Hemoglobin 12.0 - 15.0 g/dL 12.3   13.6   12.1    ?Hematocrit 36.0 - 46.0 % 36.4   40.0   36.5    ?Platelets 150 - 400 K/uL 266    217    ? ? ?Lab Results  ?Component Value Date/Time  ? VD25OH 30.56 03/19/2021 03:14 PM  ? VD25OH 47.5 12/19/2019 11:32 AM  ? VD25OH 42.6 11/01/2018 01:06 PM  ? ? ?Clinical ASCVD: No  ?The 10-year ASCVD risk score (Arnett DK, et al., 2019) is: 17.9% ?  Values used to calculate the score: ?    Age: 69 years ?    Sex: Female ?    Is Non-Hispanic African American: No ?    Diabetic: No ?    Tobacco smoker: No ?    Systolic Blood Pressure: 562 mmHg ?    Is BP treated: Yes ?    HDL Cholesterol: 47.5 mg/dL ?    Total  Cholesterol: 240 mg/dL   ? ? ?  09/22/2021  ?  1:39 PM 03/19/2021  ?  2:35 PM 12/31/2019  ?  3:11 PM  ?Depression screen PHQ 2/9  ?Decreased Interest 0 0 0  ?Down, Depressed, Hopeless 0 1 0  ?PHQ - 2 Score 0 1 0  ?Altered sleeping 2 0   ?Tired, decreased energy 2 1   ?Change in appetite 2 1   ?  Feeling bad or failure about yourself  0 0   ?Trouble concentrating 0 1   ?Moving slowly or fidgety/restless 0 0   ?Suicidal thoughts 0 0   ?PHQ-9 Score 6 4   ?Difficult doing work/chores Somewhat difficult Not difficult at all   ?  ? ? ?Social History  ? ?Tobacco Use  ?Smoking Status Former  ? Packs/day: 1.00  ? Years: 45.00  ? Pack years: 45.00  ? Types: Cigarettes  ? Quit date: 04/18/2020  ? Years since quitting: 1.5  ?Smokeless Tobacco Never  ? ?BP Readings from Last 3 Encounters:  ?10/26/21 128/83  ?10/13/21 (!) 141/68  ?09/28/21 136/70  ? ?Pulse Readings from Last 3 Encounters:  ?10/26/21 83  ?10/13/21 85  ?09/28/21 83  ? ?Wt Readings from Last 3 Encounters:  ?10/26/21 274 lb 6 oz (124.5 kg)  ?10/13/21 273 lb 3.2 oz (123.9 kg)  ?09/28/21 273 lb 3.2 oz (123.9 kg)  ? ?BMI Readings from Last 3 Encounters:  ?10/26/21 44.29 kg/m?  ?10/13/21 44.10 kg/m?  ?09/28/21 44.10 kg/m?  ? ? ?Assessment/Interventions: Review of patient past medical history, allergies, medications, health status, including review of consultants reports, laboratory and other test data, was performed as part of comprehensive evaluation and provision of chronic care management services.  ? ?SDOH:  (Social Determinants of Health) assessments and interventions performed: Yes ?SDOH Interventions   ? ?Flowsheet Row Most Recent Value  ?SDOH Interventions   ?Financial Strain Interventions Intervention Not Indicated  ?Transportation Interventions Intervention Not Indicated  ? ?  ? ?SDOH Screenings  ? ?Alcohol Screen: Not on file  ?Depression (PHQ2-9): Medium Risk  ? PHQ-2 Score: 6  ?Financial Resource Strain: Low Risk   ? Difficulty of Paying Living Expenses: Not very  hard  ?Food Insecurity: No Food Insecurity  ? Worried About Charity fundraiser in the Last Year: Never true  ? Ran Out of Food in the Last Year: Never true  ?Housing: Low Risk   ? Last Housing Risk Score: 0  ?P

## 2021-11-17 ENCOUNTER — Other Ambulatory Visit: Payer: Self-pay

## 2021-11-17 ENCOUNTER — Ambulatory Visit: Payer: Medicare HMO | Admitting: Rehabilitative and Restorative Service Providers"

## 2021-11-17 ENCOUNTER — Encounter: Payer: Self-pay | Admitting: Rehabilitative and Restorative Service Providers"

## 2021-11-17 DIAGNOSIS — R2689 Other abnormalities of gait and mobility: Secondary | ICD-10-CM | POA: Diagnosis not present

## 2021-11-17 DIAGNOSIS — G8929 Other chronic pain: Secondary | ICD-10-CM

## 2021-11-17 DIAGNOSIS — M545 Low back pain, unspecified: Secondary | ICD-10-CM | POA: Diagnosis not present

## 2021-11-17 DIAGNOSIS — M62830 Muscle spasm of back: Secondary | ICD-10-CM

## 2021-11-17 DIAGNOSIS — M6281 Muscle weakness (generalized): Secondary | ICD-10-CM

## 2021-11-17 DIAGNOSIS — R262 Difficulty in walking, not elsewhere classified: Secondary | ICD-10-CM | POA: Diagnosis not present

## 2021-11-17 NOTE — Therapy (Signed)
?OUTPATIENT PHYSICAL THERAPY TREATMENT NOTE AND REASSESSMENT ? ? ?Patient Name: Maria Marquez ?MRN: [id number] ?DOB:04/27/1946, 76 y.o., female ?Today's Date: 11/17/2021 ? ?PCP: [name], Rayford Halsted, MD ?REFERRING PROVIDER: [name], Estel* ? ? PT End of Session - 11/17/21 1145   ? ? Visit Number 12   ? Date for PT Re-Evaluation 01/15/22   ? Authorization Type [employer] 12 visits, auth requested for additional visits.   ? Authorization Time Period 09/30/21 to 11/20/21.  auth requested for additional visits   ? Authorization - Visit Number 12   ? Authorization - Number of Visits 12   ? Progress Note Due on Visit 20   ? PT Start Time 1143   ? PT Stop Time 1225   ? PT Time Calculation (min) 42 min   ? Activity Tolerance Patient tolerated treatment well   ? Behavior During Therapy [facility] for tasks assessed/performed   ? ?  ?  ? ?  ? ? ? ?Past Medical History:  ?Diagnosis Date  ? Anemia associated with chemotherapy   ? followed by dr Alen Blew  ? Bilateral lower extremity edema   ? per pt occasionally ankles swell  ? Carcinoma of renal pelvis, right ([place])   ? urologist-- dr winter/  oncologist-- dr Alen Blew;   dx 08/ 2021 ;  chemo 10/ 2021  to 12/ 2021  ? Chronic kidney disease, stage 3a ([place]) 12/31/2019  ? Dyspnea   ? 12-26-2020  per pt no issue most of the time  ? Family history of breast cancer 05/04/2021  ? Family history of ovarian cancer 05/04/2021  ? GERD (gastroesophageal reflux disease)   ? per pt occasionally takes tums  ? History of basal cell carcinoma (BCC) excision   ? per pt in 1970s on nose  ? History of chemotherapy   ? 06-13-2020  to 12/ 2021  for right renal pelvis carcinoma  ? History of colon polyps   ? History of radiation therapy   ? 11-13-2020 to 11-26-2020---   Right Lung- SBRT- Dr. Gery Pray  ? HLD (hyperlipidemia)   ? Hypertension   ? followed by pcp  ? Hypothyroidism, postsurgical 1979  ? followed by pcp  ? Leukocytosis   ? Nocturia   ? OA (osteoarthritis)   ? PONV (postoperative  nausea and vomiting)   ? Since chemo (always nauseous)  ? Pre-diabetes   ? Primary adenocarcinoma of upper lobe of right lung ([place])   ? oncologist--- shadad/  pulmonology-- dr byrum;  dx 02/ 2022;  s/p  SBRT 11-13-2020 to 11-26-2020  ? Urgency of urination   ? Wears partial dentures   ? lower  ? ?Past Surgical History:  ?Procedure Laterality Date  ? COLONOSCOPY  last one 2017  ? CYSTOSCOPY WITH BIOPSY Right 09/16/2021  ? Procedure: CYSTOSCOPY / URETEROSCOPY WITH BIOPSY/bugbee fulgeration/right retrograde;  Surgeon: Ceasar Mons, MD;  [facility];  Service: Urology;  Laterality: Right;  ? CYSTOSCOPY WITH URETEROSCOPY Right 04/18/2020  ? Procedure: CYSTOSCOPY WITH URETEROSCOPY, BIOPSY;  Surgeon: Ceasar Mons, MD;  Location: [facility];  Service: Urology;  Laterality: Right;  ONLY NEEDS 45 MIN  ? CYSTOSCOPY WITH URETEROSCOPY AND STENT PLACEMENT N/A 12/31/2020  ? Procedure: CYSTOSCOPY WITH RIGHT URETEROSCOPY/ BILATERAL RETROGRADE PYELOGRAM/RIGHT URETERAL BIOPSY/ LASER ABLATION/RIGHT STENT PLACEMENT;  Surgeon: Ceasar Mons, MD;  Location: [facility];  Service: Urology;  Laterality: N/A;  ? CYSTOSCOPY/RETROGRADE/URETEROSCOPY Right 04/22/2021  ? Procedure: CYSTOSCOPY/RETROGRADE/URETEROSCOPY/ STENT PLACEMENT;  Surgeon: Ceasar Mons,  MD;  Location: [facility];  Service: Urology;  Laterality: Right;  ? EYE SURGERY Bilateral 1987  ? tear ducts  ? IR IMAGING GUIDED PORT INSERTION  06/04/2020  ? THYROID LOBECTOMY Right 1979  ? TOTAL HIP ARTHROPLASTY Right 01/28/2016  ? Procedure: RIGHT TOTAL HIP ARTHROPLASTY ANTERIOR APPROACH;  Surgeon: [name], MD;  Location: [facility];  Service: Orthopedics;  Laterality: Right;  ? UMBILICAL HERNIA REPAIR  1980s  ? VIDEO BRONCHOSCOPY WITH ENDOBRONCHIAL NAVIGATION N/A 06/06/2020  ? Procedure: VIDEO BRONCHOSCOPY WITH ENDOBRONCHIAL NAVIGATION;  Surgeon: Collene Gobble, MD;  Location: [facility];   Service: Thoracic;  Laterality: N/A;  ? VIDEO BRONCHOSCOPY WITH ENDOBRONCHIAL NAVIGATION N/A 10/10/2020  ? Procedure: VIDEO BRONCHOSCOPY WITH ENDOBRONCHIAL NAVIGATION;  Surgeon: [name], MD;  Location: [facility];  Service: Thoracic;  Laterality: N/A;  ? ?Patient Active Problem List  ? Diagnosis Date Noted  ? Genetic testing 06/02/2021  ? Family history of breast cancer 05/04/2021  ? Family history of ovarian cancer 05/04/2021  ? Family history of colon cancer 05/04/2021  ? Vitamin D deficiency 03/20/2021  ? Primary cancer of right upper lobe of lung ([place]) 10/21/2020  ? Port-A-Cath in place 06/13/2020  ? Pulmonary nodule 06/03/2020  ? Cancer of renal pelvis, unspecified laterality ([place]) 05/26/2020  ? Chronic kidney disease, stage 3a ([place]) 12/31/2019  ? Prediabetes 12/31/2019  ? Obesity (BMI 30-39.9) 12/31/2019  ? Major depression, recurrent, chronic ([place]) 10/24/2019  ? Leg edema 10/24/2019  ? Medication intolerance 10/24/2019  ? Benign microscopic hematuria 09/17/2019  ? Myalgia due to statin 11/23/2018  ? Umbilical hernia 09/60/4540  ? Sessile colonic polyp 10/04/2017  ? Tobacco dependence 12/09/2015  ? Status post right hip replacement 07/21/2015  ? ACE inhibitor intolerance 06/17/2015  ? DJD (degenerative joint disease), lumbar 06/17/2015  ? Essential hypertension 06/17/2015  ? Mixed hyperlipidemia 06/17/2015  ? Morbid obesity ([place]) 06/17/2015  ? Post-menopause on HRT (hormone replacement therapy) 06/17/2015  ? Postoperative hypothyroidism 06/17/2015  ? Primary osteoarthritis involving multiple joints 06/17/2015  ? ?Progress Note ?Reporting Period 09/30/2021 to 11/17/2021 ? ?See note below for Objective Data and Assessment of Progress/Goals.  ? ? ?REFERRING DIAG: Chronic bilateral low back pain without sciatica ? ?THERAPY DIAG:  ?Difficulty in walking, not elsewhere classified ? ?Chronic low back pain, unspecified back pain laterality, unspecified whether sciatica present ? ?Balance problem ? ?Muscle spasm  of back ? ?Muscle weakness (generalized) ? ?PERTINENT HISTORY: HTN, Stage IIIa Chronic Kidney Disease, High-grade urothelial cancer and lung cancer ? ?PRECAUTIONS: none ? ?SUBJECTIVE: Pt reports overall having a 40% improvement in her pain since starting PT. ? ?PAIN:  ?Are you having pain? Yes ?NPRS scale: 4-5/10 ?Pain location: knee and low back ?Pain orientation: Lower  ?PAIN TYPE: aching and tight ?Pain description: intermittent  ?Aggravating factors: steps ?Relieving factors: rest ? ? ?OBJECTIVE:  ?  ?PATIENT SURVEYS:  ? ?11/17/2021: ?Modified Oswestry Low Back Pain Disability Questionnaire: 17 / 50 = 34.0 % ? ?11/10/2021: ?FOTO 58% ? ?11/05/2021: ?Modified Oswestry Low Back Pain Disability Questionnaire: 24 / 50 = 48.0 % ? ?09/30/2021: ?Modified Oswestry 28 / 50 = 56.0 %  ?FOTO 48% ?  ?LUMBARAROM/PROM ?  ?A/ROM A/ROM  ?09/30/2021 A/ROM ?11/17/2021  ?Flexion 50 70  ?Extension 30 30  ?Right lateral flexion     ?Left lateral flexion     ?Right rotation     ?Left rotation     ? (Blank rows = not tested) ? ?LE MMT: ?  ?BLE strength  grossly 4 to 4+/5 throughout ?  ?LUMBAR SPECIAL TESTS:  ?Straight leg raise test: Positive ?  ?FUNCTIONAL TESTS:  ? ?11/17/2021: ?5 times sit to/from stand:  14.3 sec without UE use ?BERG 47/56 ? ?10/14/21 ?BERG 43/56 ? ?09/30/2021 ?5 times sit to stand: 14.9 sec with limited UE use pushing up from thighs. ?Timed up and go (TUG): 11.8 sec ?  ?GAIT: ?Distance walked: 150 ft ?Assistive device utilized: None ?Level of assistance: Complete Independence ?Comments: antalgic gait pattern noted. ?  ? TODAY'S TREATMENT  ?  ?11/17/2021: ?Nustep L5 x7 min with PT present to discuss status ?PT reassessment tasks (see above) ?Seated:  LAQ, marching, hip abduction scissors.  2# 2x10 bilat ?Seated core series holding 5# weight: hip to hip, shoulder to hip x10 each ?Seated crunches with 5# weight 2x10 ?Seated hamstring stretch 2x20 sec bilat ?Leg Press 50# x10 reps, seat at 8 ? ?11/12/2021: ?Nustep L5 x6 min with PT  present to discuss status ?3 way pball roll out 5 x 10 sec hold ?Seated:  LAQ, marching, hip abduction scissors.  2# 2x10 bilat ?Seated hamstring stretch 2x20 sec bilat ?Seated core series holding 5# weight: h

## 2021-11-19 ENCOUNTER — Encounter: Payer: Self-pay | Admitting: Rehabilitative and Restorative Service Providers"

## 2021-11-19 ENCOUNTER — Other Ambulatory Visit (INDEPENDENT_AMBULATORY_CARE_PROVIDER_SITE_OTHER): Payer: Medicare HMO

## 2021-11-19 DIAGNOSIS — E890 Postprocedural hypothyroidism: Secondary | ICD-10-CM

## 2021-11-19 DIAGNOSIS — M1611 Unilateral primary osteoarthritis, right hip: Secondary | ICD-10-CM | POA: Diagnosis not present

## 2021-11-19 LAB — TSH: TSH: 2.6 u[IU]/mL (ref 0.35–5.50)

## 2021-11-20 DIAGNOSIS — E785 Hyperlipidemia, unspecified: Secondary | ICD-10-CM | POA: Diagnosis not present

## 2021-11-20 DIAGNOSIS — I10 Essential (primary) hypertension: Secondary | ICD-10-CM | POA: Diagnosis not present

## 2021-11-20 DIAGNOSIS — E039 Hypothyroidism, unspecified: Secondary | ICD-10-CM

## 2021-11-24 ENCOUNTER — Encounter: Payer: Self-pay | Admitting: Rehabilitative and Restorative Service Providers"

## 2021-11-24 ENCOUNTER — Ambulatory Visit: Payer: Medicare HMO | Attending: Internal Medicine | Admitting: Rehabilitative and Restorative Service Providers"

## 2021-11-24 DIAGNOSIS — R262 Difficulty in walking, not elsewhere classified: Secondary | ICD-10-CM | POA: Insufficient documentation

## 2021-11-24 DIAGNOSIS — M62830 Muscle spasm of back: Secondary | ICD-10-CM | POA: Diagnosis not present

## 2021-11-24 DIAGNOSIS — M6281 Muscle weakness (generalized): Secondary | ICD-10-CM | POA: Diagnosis not present

## 2021-11-24 DIAGNOSIS — M545 Low back pain, unspecified: Secondary | ICD-10-CM | POA: Diagnosis not present

## 2021-11-24 DIAGNOSIS — R2689 Other abnormalities of gait and mobility: Secondary | ICD-10-CM | POA: Insufficient documentation

## 2021-11-24 DIAGNOSIS — G8929 Other chronic pain: Secondary | ICD-10-CM | POA: Diagnosis not present

## 2021-11-24 NOTE — Therapy (Signed)
?OUTPATIENT PHYSICAL THERAPY TREATMENT NOTE AND REASSESSMENT ? ? ?Patient Name: Maria Marquez ?MRN: [id number] ?DOB:May 28, 1946, 76 y.o., female ?Today's Date: 11/24/2021 ? ?PCP: [name], Rayford Halsted, MD ?REFERRING PROVIDER: [name], Estel* ? ? PT End of Session - 11/24/21 1151   ? ? Visit Number 13   ? Date for PT Re-Evaluation 01/15/22   ? Authorization Type [employer] 12 additional visits   ? Authorization Time Period 11/21/2021-01/15/2022   ? Authorization - Visit Number 13   ? Authorization - Number of Visits 24   ? Progress Note Due on Visit 20   ? PT Start Time 1145   ? PT Stop Time 1208   ? PT Time Calculation (min) 23 min   ? Activity Tolerance Patient tolerated treatment well   ? Behavior During Therapy [facility] for tasks assessed/performed   ? ?  ?  ? ?  ? ? ? ?Past Medical History:  ?Diagnosis Date  ? Anemia associated with chemotherapy   ? followed by dr Alen Blew  ? Bilateral lower extremity edema   ? per pt occasionally ankles swell  ? Carcinoma of renal pelvis, right ([place])   ? urologist-- dr winter/  oncologist-- dr Alen Blew;   dx 08/ 2021 ;  chemo 10/ 2021  to 12/ 2021  ? Chronic kidney disease, stage 3a ([place]) 12/31/2019  ? Dyspnea   ? 12-26-2020  per pt no issue most of the time  ? Family history of breast cancer 05/04/2021  ? Family history of ovarian cancer 05/04/2021  ? GERD (gastroesophageal reflux disease)   ? per pt occasionally takes tums  ? History of basal cell carcinoma (BCC) excision   ? per pt in 1970s on nose  ? History of chemotherapy   ? 06-13-2020  to 12/ 2021  for right renal pelvis carcinoma  ? History of colon polyps   ? History of radiation therapy   ? 11-13-2020 to 11-26-2020---   Right Lung- SBRT- Dr. Gery Pray  ? HLD (hyperlipidemia)   ? Hypertension   ? followed by pcp  ? Hypothyroidism, postsurgical 1979  ? followed by pcp  ? Leukocytosis   ? Nocturia   ? OA (osteoarthritis)   ? PONV (postoperative nausea and vomiting)   ? Since chemo (always nauseous)  ?  Pre-diabetes   ? Primary adenocarcinoma of upper lobe of right lung ([place])   ? oncologist--- shadad/  pulmonology-- dr byrum;  dx 02/ 2022;  s/p  SBRT 11-13-2020 to 11-26-2020  ? Urgency of urination   ? Wears partial dentures   ? lower  ? ?Past Surgical History:  ?Procedure Laterality Date  ? COLONOSCOPY  last one 2017  ? CYSTOSCOPY WITH BIOPSY Right 09/16/2021  ? Procedure: CYSTOSCOPY / URETEROSCOPY WITH BIOPSY/bugbee fulgeration/right retrograde;  Surgeon: Ceasar Mons, MD;  Location: [facility];  Service: Urology;  Laterality: Right;  ? CYSTOSCOPY WITH URETEROSCOPY Right 04/18/2020  ? Procedure: CYSTOSCOPY WITH URETEROSCOPY, BIOPSY;  Surgeon: Ceasar Mons, MD;  Location: [facility];  Service: Urology;  Laterality: Right;  ONLY NEEDS 45 MIN  ? CYSTOSCOPY WITH URETEROSCOPY AND STENT PLACEMENT N/A 12/31/2020  ? Procedure: CYSTOSCOPY WITH RIGHT URETEROSCOPY/ BILATERAL RETROGRADE PYELOGRAM/RIGHT URETERAL BIOPSY/ LASER ABLATION/RIGHT STENT PLACEMENT;  Surgeon: Ceasar Mons, MD;  Location: [facility];  Service: Urology;  Laterality: N/A;  ? CYSTOSCOPY/RETROGRADE/URETEROSCOPY Right 04/22/2021  ? Procedure: CYSTOSCOPY/RETROGRADE/URETEROSCOPY/ STENT PLACEMENT;  Surgeon: Ceasar Mons, MD;  Location: [facility];  Service: Urology;  Laterality:  Right;  ? EYE SURGERY Bilateral 1987  ? tear ducts  ? IR IMAGING GUIDED PORT INSERTION  06/04/2020  ? THYROID LOBECTOMY Right 1979  ? TOTAL HIP ARTHROPLASTY Right 01/28/2016  ? Procedure: RIGHT TOTAL HIP ARTHROPLASTY ANTERIOR APPROACH;  Surgeon: [name], MD;  Location: [facility];  Service: Orthopedics;  Laterality: Right;  ? UMBILICAL HERNIA REPAIR  1980s  ? VIDEO BRONCHOSCOPY WITH ENDOBRONCHIAL NAVIGATION N/A 06/06/2020  ? Procedure: VIDEO BRONCHOSCOPY WITH ENDOBRONCHIAL NAVIGATION;  Surgeon: Collene Gobble, MD;  Location: [facility];  Service: Thoracic;  Laterality: N/A;  ? VIDEO BRONCHOSCOPY  WITH ENDOBRONCHIAL NAVIGATION N/A 10/10/2020  ? Procedure: VIDEO BRONCHOSCOPY WITH ENDOBRONCHIAL NAVIGATION;  Surgeon: [name], MD;  Location: [place];  Service: Thoracic;  Laterality: N/A;  ? ?Patient Active Problem List  ? Diagnosis Date Noted  ? Genetic testing 06/02/2021  ? Family history of breast cancer 05/04/2021  ? Family history of ovarian cancer 05/04/2021  ? Family history of colon cancer 05/04/2021  ? Vitamin D deficiency 03/20/2021  ? Primary cancer of right upper lobe of lung ([place]) 10/21/2020  ? Port-A-Cath in place 06/13/2020  ? Pulmonary nodule 06/03/2020  ? Cancer of renal pelvis, unspecified laterality ([place]) 05/26/2020  ? Chronic kidney disease, stage 3a ([place]) 12/31/2019  ? Prediabetes 12/31/2019  ? Obesity (BMI 30-39.9) 12/31/2019  ? Major depression, recurrent, chronic ([place]) 10/24/2019  ? Leg edema 10/24/2019  ? Medication intolerance 10/24/2019  ? Benign microscopic hematuria 09/17/2019  ? Myalgia due to statin 11/23/2018  ? Umbilical hernia 03/47/4259  ? Sessile colonic polyp 10/04/2017  ? Tobacco dependence 12/09/2015  ? Status post right hip replacement 07/21/2015  ? ACE inhibitor intolerance 06/17/2015  ? DJD (degenerative joint disease), lumbar 06/17/2015  ? Essential hypertension 06/17/2015  ? Mixed hyperlipidemia 06/17/2015  ? Morbid obesity ([place]) 06/17/2015  ? Post-menopause on HRT (hormone replacement therapy) 06/17/2015  ? Postoperative hypothyroidism 06/17/2015  ? Primary osteoarthritis involving multiple joints 06/17/2015  ? ?Progress Note ?Reporting Period 09/30/2021 to 11/17/2021 ? ?See note below for Objective Data and Assessment of Progress/Goals.  ? ? ?REFERRING DIAG: Chronic bilateral low back pain without sciatica ? ?THERAPY DIAG:  ?Difficulty in walking, not elsewhere classified ? ?Chronic low back pain, unspecified back pain laterality, unspecified whether sciatica present ? ?Balance problem ? ?Muscle spasm of back ? ?Muscle weakness (generalized) ? ?PERTINENT  HISTORY: HTN, Stage IIIa Chronic Kidney Disease, High-grade urothelial cancer and lung cancer ? ?PRECAUTIONS: none ? ?SUBJECTIVE: Pt reports having a dental procedure yesterday and her mouth is still hurting.  Pt reports that her daughter was diagnosed with cancer and she is really worried about her.  Pt reports that she went to Dr Elmyra Ricks for her hip and he did a radiograph and told her that her hip looked good, and he just believed it was due to a pinched nerve. ? ?PAIN:  ?Are you having pain? Yes ?NPRS scale: 2/10 ?Pain location: knee and low back ?Pain orientation: Lower  ?PAIN TYPE: aching and tight ?Pain description: intermittent  ?Aggravating factors: steps ?Relieving factors: rest ? ? ?OBJECTIVE:  ?  ?PATIENT SURVEYS:  ? ?11/17/2021: ?Modified Oswestry Low Back Pain Disability Questionnaire: 17 / 50 = 34.0 % ? ?11/10/2021: ?FOTO 58% ? ?11/05/2021: ?Modified Oswestry Low Back Pain Disability Questionnaire: 24 / 50 = 48.0 % ? ?09/30/2021: ?Modified Oswestry 28 / 50 = 56.0 %  ?FOTO 48% ?  ?LUMBARAROM/PROM ?  ?A/ROM A/ROM  ?09/30/2021 A/ROM ?11/17/2021  ?Flexion 50 70  ?Extension 30  30  ?Right lateral flexion     ?Left lateral flexion     ?Right rotation     ?Left rotation     ? (Blank rows = not tested) ? ?LE MMT: ?  ?BLE strength grossly 4 to 4+/5 throughout ?  ?LUMBAR SPECIAL TESTS:  ?Straight leg raise test: Positive ?  ?FUNCTIONAL TESTS:  ? ?11/17/2021: ?5 times sit to/from stand:  14.3 sec without UE use ?BERG 47/56 ? ?10/14/21 ?BERG 43/56 ? ?09/30/2021 ?5 times sit to stand: 14.9 sec with limited UE use pushing up from thighs. ?Timed up and go (TUG): 11.8 sec ?  ?GAIT: ?Distance walked: 150 ft ?Assistive device utilized: None ?Level of assistance: Complete Independence ?Comments: antalgic gait pattern noted. ?  ? TODAY'S TREATMENT  ?  ?11/24/2021: ?Nustep L5 x7 min with PT present to discuss status ?Seated:  LAQ, marching, hip abduction scissors.  2# 2x10 bilat ?Seated hamstring stretch 2x20 sec bilat ?4 way pelvic  tilts 2x10 each way ? ?11/17/2021: ?Nustep L5 x7 min with PT present to discuss status ?PT reassessment tasks (see above) ?Seated:  LAQ, marching, hip abduction scissors.  2# 2x10 bilat ?Seated core series holding 5# weight:

## 2021-11-26 ENCOUNTER — Telehealth: Payer: Self-pay | Admitting: Oncology

## 2021-11-26 ENCOUNTER — Encounter: Payer: Self-pay | Admitting: Rehabilitative and Restorative Service Providers"

## 2021-11-26 ENCOUNTER — Ambulatory Visit: Payer: Medicare HMO | Admitting: Rehabilitative and Restorative Service Providers"

## 2021-11-26 DIAGNOSIS — M545 Low back pain, unspecified: Secondary | ICD-10-CM

## 2021-11-26 DIAGNOSIS — R262 Difficulty in walking, not elsewhere classified: Secondary | ICD-10-CM

## 2021-11-26 DIAGNOSIS — M6281 Muscle weakness (generalized): Secondary | ICD-10-CM

## 2021-11-26 DIAGNOSIS — R2689 Other abnormalities of gait and mobility: Secondary | ICD-10-CM

## 2021-11-26 DIAGNOSIS — M62830 Muscle spasm of back: Secondary | ICD-10-CM

## 2021-11-26 DIAGNOSIS — G8929 Other chronic pain: Secondary | ICD-10-CM | POA: Diagnosis not present

## 2021-11-26 NOTE — Therapy (Signed)
?OUTPATIENT PHYSICAL THERAPY TREATMENT NOTE AND REASSESSMENT ? ? ?Patient Name: Maria Marquez ?MRN: [id number] ?DOB:[birth year], 76 y.o., female ?Today's Date: 11/26/2021 ? ?PCP: [name], Rayford Halsted, MD ?REFERRING PROVIDER: [name], Estel* ? ? PT End of Session - 11/26/21 1146   ? ? Visit Number 14   ? Date for PT Re-Evaluation 01/15/22   ? Authorization Type [employer] 12 additional visits   ? Authorization Time Period 11/21/2021-01/15/2022   ? Authorization - Visit Number 14   ? Authorization - Number of Visits 24   ? Progress Note Due on Visit 20   ? PT Start Time 1145   ? PT Stop Time 1225   ? PT Time Calculation (min) 40 min   ? Activity Tolerance Patient tolerated treatment well   ? Behavior During Therapy [facility] for tasks assessed/performed   ? ?  ?  ? ?  ? ? ? ?Past Medical History:  ?Diagnosis Date  ? Anemia associated with chemotherapy   ? followed by dr Alen Blew  ? Bilateral lower extremity edema   ? per pt occasionally ankles swell  ? Carcinoma of renal pelvis, right ([place])   ? urologist-- dr winter/  oncologist-- dr Alen Blew;   dx 08/ 2021 ;  chemo 10/ 2021  to 12/ 2021  ? Chronic kidney disease, stage 3a ([place]) 12/31/2019  ? Dyspnea   ? 12-26-2020  per pt no issue most of the time  ? Family history of breast cancer 05/04/2021  ? Family history of ovarian cancer 05/04/2021  ? GERD (gastroesophageal reflux disease)   ? per pt occasionally takes tums  ? History of basal cell carcinoma (BCC) excision   ? per pt in 1970s on nose  ? History of chemotherapy   ? 06-13-2020  to 12/ 2021  for right renal pelvis carcinoma  ? History of colon polyps   ? History of radiation therapy   ? 11-13-2020 to 11-26-2020---   Right Lung- SBRT- Dr. Gery Pray  ? HLD (hyperlipidemia)   ? Hypertension   ? followed by pcp  ? Hypothyroidism, postsurgical 1979  ? followed by pcp  ? Leukocytosis   ? Nocturia   ? OA (osteoarthritis)   ? PONV (postoperative nausea and vomiting)   ? Since chemo (always nauseous)  ?  Pre-diabetes   ? Primary adenocarcinoma of upper lobe of right lung ([place])   ? oncologist--- shadad/  pulmonology-- dr byrum;  dx 02/ 2022;  s/p  SBRT 11-13-2020 to 11-26-2020  ? Urgency of urination   ? Wears partial dentures   ? lower  ? ?Past Surgical History:  ?Procedure Laterality Date  ? COLONOSCOPY  last one 2017  ? CYSTOSCOPY WITH BIOPSY Right 09/16/2021  ? Procedure: CYSTOSCOPY / URETEROSCOPY WITH BIOPSY/bugbee fulgeration/right retrograde;  Surgeon: Ceasar Mons, MD;  Location[facility];  Service: Urology;  Laterality: Right;  ? CYSTOSCOPY WITH URETEROSCOPY Right 04/18/2020  ? Procedure: CYSTOSCOPY WITH URETEROSCOPY, BIOPSY;  Surgeon: Ceasar Mons, MD;  Location: [facility];  Service: Urology;  Laterality: Right;  ONLY NEEDS 45 MIN  ? CYSTOSCOPY WITH URETEROSCOPY AND STENT PLACEMENT N/A 12/31/2020  ? Procedure: CYSTOSCOPY WITH RIGHT URETEROSCOPY/ BILATERAL RETROGRADE PYELOGRAM/RIGHT URETERAL BIOPSY/ LASER ABLATION/RIGHT STENT PLACEMENT;  Surgeon: Ceasar Mons, MD;  Location: [facility];  Service: Urology;  Laterality: N/A;  ? CYSTOSCOPY/RETROGRADE/URETEROSCOPY Right 04/22/2021  ? Procedure: CYSTOSCOPY/RETROGRADE/URETEROSCOPY/ STENT PLACEMENT;  Surgeon: Ceasar Mons, MD;  [facility];  Service: Urology;  Laterality:  Right;  ? EYE SURGERY Bilateral 1987  ? tear ducts  ? IR IMAGING GUIDED PORT INSERTION  06/04/2020  ? THYROID LOBECTOMY Right 1979  ? TOTAL HIP ARTHROPLASTY Right 01/28/2016  ? Procedure: RIGHT TOTAL HIP ARTHROPLASTY ANTERIOR APPROACH;  Surgeon: [name], MD;  Location: [facility];  Service: Orthopedics;  Laterality: Right;  ? UMBILICAL HERNIA REPAIR  1980s  ? VIDEO BRONCHOSCOPY WITH ENDOBRONCHIAL NAVIGATION N/A 06/06/2020  ? Procedure: VIDEO BRONCHOSCOPY WITH ENDOBRONCHIAL NAVIGATION;  Surgeon: Collene Gobble, MD;  Location: [facility];  Service: Thoracic;  Laterality: N/A;  ? VIDEO BRONCHOSCOPY  WITH ENDOBRONCHIAL NAVIGATION N/A 10/10/2020  ? Procedure: VIDEO BRONCHOSCOPY WITH ENDOBRONCHIAL NAVIGATION;  Surgeon: [name], MD;  Location: [place];  Service: Thoracic;  Laterality: N/A;  ? ?Patient Active Problem List  ? Diagnosis Date Noted  ? Genetic testing 06/02/2021  ? Family history of breast cancer 05/04/2021  ? Family history of ovarian cancer 05/04/2021  ? Family history of colon cancer 05/04/2021  ? Vitamin D deficiency 03/20/2021  ? Primary cancer of right upper lobe of lung ([place]) 10/21/2020  ? Port-A-Cath in place 06/13/2020  ? Pulmonary nodule 06/03/2020  ? Cancer of renal pelvis, unspecified laterality ([place]) 05/26/2020  ? Chronic kidney disease, stage 3a ([place]) 12/31/2019  ? Prediabetes 12/31/2019  ? Obesity (BMI 30-39.9) 12/31/2019  ? Major depression, recurrent, chronic ([place]) 10/24/2019  ? Leg edema 10/24/2019  ? Medication intolerance 10/24/2019  ? Benign microscopic hematuria 09/17/2019  ? Myalgia due to statin 11/23/2018  ? Umbilical hernia 55/73/2202  ? Sessile colonic polyp 10/04/2017  ? Tobacco dependence 12/09/2015  ? Status post right hip replacement 07/21/2015  ? ACE inhibitor intolerance 06/17/2015  ? DJD (degenerative joint disease), lumbar 06/17/2015  ? Essential hypertension 06/17/2015  ? Mixed hyperlipidemia 06/17/2015  ? Morbid obesity ([place]) 06/17/2015  ? Post-menopause on HRT (hormone replacement therapy) 06/17/2015  ? Postoperative hypothyroidism 06/17/2015  ? Primary osteoarthritis involving multiple joints 06/17/2015  ? ?Progress Note ?Reporting Period 09/30/2021 to 11/17/2021 ? ?See note below for Objective Data and Assessment of Progress/Goals.  ? ? ?REFERRING DIAG: Chronic bilateral low back pain without sciatica ? ?THERAPY DIAG:  ?Difficulty in walking, not elsewhere classified ? ?Chronic low back pain, unspecified back pain laterality, unspecified whether sciatica present ? ?Balance problem ? ?Muscle spasm of back ? ?Muscle weakness (generalized) ? ?PERTINENT  HISTORY: HTN, Stage IIIa Chronic Kidney Disease, High-grade urothelial cancer and lung cancer ? ?PRECAUTIONS: none ? ?SUBJECTIVE: Pt reports still having some pressure and pain in her mouth from her dental procedure, but her swelling has gone down. ? ?PAIN:  ?Are you having pain? Yes ?NPRS scale: 3/10 ?Pain location: knee and low back ?Pain orientation: Lower  ?PAIN TYPE: aching and tight ?Pain description: intermittent  ?Aggravating factors: steps ?Relieving factors: rest ? ? ?OBJECTIVE:  ?  ?PATIENT SURVEYS:  ? ?11/17/2021: ?Modified Oswestry Low Back Pain Disability Questionnaire: 17 / 50 = 34.0 % ? ?11/10/2021: ?FOTO 58% ? ?11/05/2021: ?Modified Oswestry Low Back Pain Disability Questionnaire: 24 / 50 = 48.0 % ? ?09/30/2021: ?Modified Oswestry 28 / 50 = 56.0 %  ?FOTO 48% ?  ?LUMBARAROM/PROM ?  ?A/ROM A/ROM  ?09/30/2021 A/ROM ?11/17/2021  ?Flexion 50 70  ?Extension 30 30  ?Right lateral flexion     ?Left lateral flexion     ?Right rotation     ?Left rotation     ? (Blank rows = not tested) ? ?LE MMT: ?  ?BLE strength grossly 4 to 4+/5  throughout ?  ?LUMBAR SPECIAL TESTS:  ?Straight leg raise test: Positive ?  ?FUNCTIONAL TESTS:  ? ?11/17/2021: ?5 times sit to/from stand:  14.3 sec without UE use ?BERG 47/56 ? ?10/14/21 ?BERG 43/56 ? ?09/30/2021 ?5 times sit to stand: 14.9 sec with limited UE use pushing up from thighs. ?Timed up and go (TUG): 11.8 sec ?  ?GAIT: ?Distance walked: 150 ft ?Assistive device utilized: None ?Level of assistance: Complete Independence ?Comments: antalgic gait pattern noted. ?  ? TODAY'S TREATMENT  ?  ?11/26/2021: ?Nustep L5 x7 min with PT present to discuss status ?Seated:  LAQ, marching, hip abduction scissors.  3# 2x10 bilat ?4 way pelvic tilts, CW/CCW seated on dynadisc 2x10 each way ?Seated hamstring stretch 2x20 sec bilat ?Seated crunches with 5# weight 2x10 ?Seated core series holding 5# weight: hip to hip, shoulder to hip x10 each ?Ambulation down between PT gyms with SBA and no loss of  balance x5 min. ? ?11/24/2021: ?Nustep L5 x7 min with PT present to discuss status ?Seated:  LAQ, marching, hip abduction scissors.  2# 2x10 bilat ?Seated hamstring stretch 2x20 sec bilat ?4 way pelvic tilts 2x10 each way ? ?3/2

## 2021-11-26 NOTE — Telephone Encounter (Signed)
Called patient regarding upcoming appointments, left a voicemail. 

## 2021-12-01 ENCOUNTER — Ambulatory Visit: Payer: Medicare HMO

## 2021-12-03 ENCOUNTER — Ambulatory Visit: Payer: Medicare HMO

## 2021-12-03 DIAGNOSIS — M6281 Muscle weakness (generalized): Secondary | ICD-10-CM

## 2021-12-03 DIAGNOSIS — M545 Low back pain, unspecified: Secondary | ICD-10-CM

## 2021-12-03 DIAGNOSIS — R2689 Other abnormalities of gait and mobility: Secondary | ICD-10-CM

## 2021-12-03 DIAGNOSIS — R262 Difficulty in walking, not elsewhere classified: Secondary | ICD-10-CM | POA: Diagnosis not present

## 2021-12-03 DIAGNOSIS — M62830 Muscle spasm of back: Secondary | ICD-10-CM

## 2021-12-03 DIAGNOSIS — G8929 Other chronic pain: Secondary | ICD-10-CM | POA: Diagnosis not present

## 2021-12-03 NOTE — Therapy (Signed)
?OUTPATIENT PHYSICAL THERAPY TREATMENT NOTE AND REASSESSMENT ? ? ?Patient Name: Maria Marquez ?MRN: [id number] ?DOB:[birth year], 76 y.o., female ?Today's Date: 12/03/2021 ? ?PCP: [name], Rayford Halsted, MD ?REFERRING PROVIDER: [name], Estel* ? ? PT End of Session - 12/03/21 1147   ? ? Visit Number 15   ? Number of Visits 24   ? Date for PT Re-Evaluation 01/15/22   ? Authorization Type [employer] 12 additional visits   ? Authorization Time Period 11/21/2021-01/15/2022   ? Authorization - Visit Number 15   ? Authorization - Number of Visits 24   ? Progress Note Due on Visit 20   ? PT Start Time 1147   ? PT Stop Time 1230   ? PT Time Calculation (min) 43 min   ? Activity Tolerance Patient tolerated treatment well   ? Behavior During Therapy [facility] for tasks assessed/performed   ? ?  ?  ? ?  ? ? ? ?Past Medical History:  ?Diagnosis Date  ? Anemia associated with chemotherapy   ? followed by dr Alen Blew  ? Bilateral lower extremity edema   ? per pt occasionally ankles swell  ? Carcinoma of renal pelvis, right ([place])   ? urologist-- dr winter/  oncologist-- dr Alen Blew;   dx 08/ 2021 ;  chemo 10/ 2021  to 12/ 2021  ? Chronic kidney disease, stage 3a ([place]) 12/31/2019  ? Dyspnea   ? 12-26-2020  per pt no issue most of the time  ? Family history of breast cancer 05/04/2021  ? Family history of ovarian cancer 05/04/2021  ? GERD (gastroesophageal reflux disease)   ? per pt occasionally takes tums  ? History of basal cell carcinoma (BCC) excision   ? per pt in 1970s on nose  ? History of chemotherapy   ? 06-13-2020  to 12/ 2021  for right renal pelvis carcinoma  ? History of colon polyps   ? History of radiation therapy   ? 11-13-2020 to 11-26-2020---   Right Lung- SBRT- Dr. Gery Pray  ? HLD (hyperlipidemia)   ? Hypertension   ? followed by pcp  ? Hypothyroidism, postsurgical 1979  ? followed by pcp  ? Leukocytosis   ? Nocturia   ? OA (osteoarthritis)   ? PONV (postoperative nausea and vomiting)   ? Since chemo (always  nauseous)  ? Pre-diabetes   ? Primary adenocarcinoma of upper lobe of right lung ([place])   ? oncologist--- shadad/  pulmonology-- dr byrum;  dx 02/ 2022;  s/p  SBRT 11-13-2020 to 11-26-2020  ? Urgency of urination   ? Wears partial dentures   ? lower  ? ?Past Surgical History:  ?Procedure Laterality Date  ? COLONOSCOPY  last one 2017  ? CYSTOSCOPY WITH BIOPSY Right 09/16/2021  ? Procedure: CYSTOSCOPY / URETEROSCOPY WITH BIOPSY/bugbee fulgeration/right retrograde;  Surgeon: Ceasar Mons, MD;  Location: [facility];  Service: Urology;  Laterality: Right;  ? CYSTOSCOPY WITH URETEROSCOPY Right 04/18/2020  ? Procedure: CYSTOSCOPY WITH URETEROSCOPY, BIOPSY;  Surgeon: Ceasar Mons, MD;  Location: [facility];  Service: Urology;  Laterality: Right;  ONLY NEEDS 45 MIN  ? CYSTOSCOPY WITH URETEROSCOPY AND STENT PLACEMENT N/A 12/31/2020  ? Procedure: CYSTOSCOPY WITH RIGHT URETEROSCOPY/ BILATERAL RETROGRADE PYELOGRAM/RIGHT URETERAL BIOPSY/ LASER ABLATION/RIGHT STENT PLACEMENT;  Surgeon: Ceasar Mons, MD;  Location: [facility];  Service: Urology;  Laterality: N/A;  ? CYSTOSCOPY/RETROGRADE/URETEROSCOPY Right 04/22/2021  ? Procedure: CYSTOSCOPY/RETROGRADE/URETEROSCOPY/ STENT PLACEMENT;  Surgeon: Ceasar Mons, MD;  Location: [facility];  Service: Urology;  Laterality: Right;  ? EYE SURGERY Bilateral 1987  ? tear ducts  ? IR IMAGING GUIDED PORT INSERTION  06/04/2020  ? THYROID LOBECTOMY Right 1979  ? TOTAL HIP ARTHROPLASTY Right 01/28/2016  ? Procedure: RIGHT TOTAL HIP ARTHROPLASTY ANTERIOR APPROACH;  Surgeon: [name], MD;  Location: [facility];  Service: Orthopedics;  Laterality: Right;  ? UMBILICAL HERNIA REPAIR  1980s  ? VIDEO BRONCHOSCOPY WITH ENDOBRONCHIAL NAVIGATION N/A 06/06/2020  ? Procedure: VIDEO BRONCHOSCOPY WITH ENDOBRONCHIAL NAVIGATION;  Surgeon: Collene Gobble, MD;  Location: [facility];  Service: Thoracic;  Laterality: N/A;  ? VIDEO  BRONCHOSCOPY WITH ENDOBRONCHIAL NAVIGATION N/A 10/10/2020  ? Procedure: VIDEO BRONCHOSCOPY WITH ENDOBRONCHIAL NAVIGATION;  Surgeon: [name], MD;  Location: [place];  Service: Thoracic;  Laterality: N/A;  ? ?Patient Active Problem List  ? Diagnosis Date Noted  ? Genetic testing 06/02/2021  ? Family history of breast cancer 05/04/2021  ? Family history of ovarian cancer 05/04/2021  ? Family history of colon cancer 05/04/2021  ? Vitamin D deficiency 03/20/2021  ? Primary cancer of right upper lobe of lung ([place]) 10/21/2020  ? Port-A-Cath in place 06/13/2020  ? Pulmonary nodule 06/03/2020  ? Cancer of renal pelvis, unspecified laterality ([place]) 05/26/2020  ? Chronic kidney disease, stage 3a ([place]) 12/31/2019  ? Prediabetes 12/31/2019  ? Obesity (BMI 30-39.9) 12/31/2019  ? Major depression, recurrent, chronic ([place]) 10/24/2019  ? Leg edema 10/24/2019  ? Medication intolerance 10/24/2019  ? Benign microscopic hematuria 09/17/2019  ? Myalgia due to statin 11/23/2018  ? Umbilical hernia 41/66/0630  ? Sessile colonic polyp 10/04/2017  ? Tobacco dependence 12/09/2015  ? Status post right hip replacement 07/21/2015  ? ACE inhibitor intolerance 06/17/2015  ? DJD (degenerative joint disease), lumbar 06/17/2015  ? Essential hypertension 06/17/2015  ? Mixed hyperlipidemia 06/17/2015  ? Morbid obesity ([place]) 06/17/2015  ? Post-menopause on HRT (hormone replacement therapy) 06/17/2015  ? Postoperative hypothyroidism 06/17/2015  ? Primary osteoarthritis involving multiple joints 06/17/2015  ? ?Progress Note ?Reporting Period 09/30/2021 to 11/17/2021 ? ?See note below for Objective Data and Assessment of Progress/Goals.  ? ? ?REFERRING DIAG: Chronic bilateral low back pain without sciatica ? ?THERAPY DIAG:  ?Difficulty in walking, not elsewhere classified ? ?Chronic low back pain, unspecified back pain laterality, unspecified whether sciatica present ? ?Balance problem ? ?Muscle spasm of back ? ?Muscle weakness  (generalized) ? ?PERTINENT HISTORY: HTN, Stage IIIa Chronic Kidney Disease, High-grade urothelial cancer and lung cancer ? ?PRECAUTIONS: none ? ?SUBJECTIVE: Pt reports she is about 30% better overall.  "I don't have the pain in my hips as much but if I stand for any more than 5 min, my back pain elevates to 6/10 then progressively gets worse.  When I go shopping, I have to find a place to sit because it gets so bad.   ? ?PAIN:  ?Are you having pain? Yes ?NPRS scale: 3/10 ?Pain location: knee and low back ?Pain orientation: Lower  ?PAIN TYPE: aching and tight ?Pain description: intermittent  ?Aggravating factors: steps ?Relieving factors: rest ? ? ?OBJECTIVE:  ?  ?PATIENT SURVEYS:  ? ?11/17/2021: ?Modified Oswestry Low Back Pain Disability Questionnaire: 17 / 50 = 34.0 % ? ?11/10/2021: ?FOTO 58% ? ?11/05/2021: ?Modified Oswestry Low Back Pain Disability Questionnaire: 24 / 50 = 48.0 % ? ?09/30/2021: ?Modified Oswestry 28 / 50 = 56.0 %  ?FOTO 48% ?  ?LUMBARAROM/PROM ?  ?A/ROM A/ROM  ?09/30/2021 A/ROM ?11/17/2021  ?Flexion 50 70  ?Extension 30  30  ?Right lateral flexion     ?Left lateral flexion     ?Right rotation     ?Left rotation     ? (Blank rows = not tested) ? ?LE MMT: ?  ?BLE strength grossly 4 to 4+/5 throughout ?  ?LUMBAR SPECIAL TESTS:  ?Straight leg raise test: Positive ?  ?FUNCTIONAL TESTS:  ? ?11/17/2021: ?5 times sit to/from stand:  14.3 sec without UE use ?BERG 47/56 ? ?10/14/21 ?BERG 43/56 ? ?09/30/2021 ?5 times sit to stand: 14.9 sec with limited UE use pushing up from thighs. ?Timed up and go (TUG): 11.8 sec ?  ?GAIT: ?Distance walked: 150 ft ?Assistive device utilized: None ?Level of assistance: Complete Independence ?Comments: antalgic gait pattern noted. ?  ? TODAY'S TREATMENT  ?  ?12/03/2021: ?Nustep L5 x7 min with PT present to discuss status ?Standing hamstring stretch 3 x 30 sec at treatment table adjusted to patient tolerance ?Attempted Standing quad stretch but patient unable: modified to supine with leg  hanging off edge of table x 1 min each ?Hooklying PPT ?PPT with march x 20 ?PPT with weighted shoulder flexion to alternating knee touch x 20 (yellow plyo ball) ?PPT with weighted shoulder flexion to SLR x 20 (yellow plyo

## 2021-12-08 ENCOUNTER — Encounter: Payer: Self-pay | Admitting: Rehabilitative and Restorative Service Providers"

## 2021-12-10 ENCOUNTER — Encounter: Payer: Self-pay | Admitting: Rehabilitative and Restorative Service Providers"

## 2021-12-10 ENCOUNTER — Ambulatory Visit: Payer: Medicare HMO

## 2021-12-10 DIAGNOSIS — R262 Difficulty in walking, not elsewhere classified: Secondary | ICD-10-CM

## 2021-12-10 DIAGNOSIS — M6281 Muscle weakness (generalized): Secondary | ICD-10-CM | POA: Diagnosis not present

## 2021-12-10 DIAGNOSIS — M62830 Muscle spasm of back: Secondary | ICD-10-CM

## 2021-12-10 DIAGNOSIS — R2689 Other abnormalities of gait and mobility: Secondary | ICD-10-CM | POA: Diagnosis not present

## 2021-12-10 DIAGNOSIS — G8929 Other chronic pain: Secondary | ICD-10-CM | POA: Diagnosis not present

## 2021-12-10 DIAGNOSIS — M545 Low back pain, unspecified: Secondary | ICD-10-CM | POA: Diagnosis not present

## 2021-12-10 NOTE — Therapy (Signed)
?OUTPATIENT PHYSICAL THERAPY TREATMENT NOTE AND REASSESSMENT ? ? ?Patient Name: Maria Marquez ?MRN: [id number] ?DOB:04/08/1946, 76 y.o., female ?Today's Date: 12/10/2021 ? ?PCP: [name], Rayford Halsted, MD ?REFERRING PROVIDER: [name], Estel* ? ? PT End of Session - 12/10/21 1233   ? ? Visit Number 16   ? Number of Visits 24   ? Date for PT Re-Evaluation 01/15/22   ? Authorization Type [employer] 12 additional visits   ? Authorization Time Period 11/21/2021-01/15/2022   ? Authorization - Visit Number 16   ? Authorization - Number of Visits 24   ? Progress Note Due on Visit 20   ? PT Start Time 1235   ? PT Stop Time 1313   ? PT Time Calculation (min) 38 min   ? Activity Tolerance Patient tolerated treatment well   ? Behavior During Therapy [facility] for tasks assessed/performed   ? ?  ?  ? ?  ? ? ? ?Past Medical History:  ?Diagnosis Date  ? Anemia associated with chemotherapy   ? followed by dr Alen Blew  ? Bilateral lower extremity edema   ? per pt occasionally ankles swell  ? Carcinoma of renal pelvis, right ([place])   ? urologist-- dr winter/  oncologist-- dr Alen Blew;   dx 08/ 2021 ;  chemo 10/ 2021  to 12/ 2021  ? Chronic kidney disease, stage 3a ([place]) 12/31/2019  ? Dyspnea   ? 12-26-2020  per pt no issue most of the time  ? Family history of breast cancer 05/04/2021  ? Family history of ovarian cancer 05/04/2021  ? GERD (gastroesophageal reflux disease)   ? per pt occasionally takes tums  ? History of basal cell carcinoma (BCC) excision   ? per pt in 1970s on nose  ? History of chemotherapy   ? 06-13-2020  to 12/ 2021  for right renal pelvis carcinoma  ? History of colon polyps   ? History of radiation therapy   ? 11-13-2020 to 11-26-2020---   Right Lung- SBRT- Dr. Gery Pray  ? HLD (hyperlipidemia)   ? Hypertension   ? followed by pcp  ? Hypothyroidism, postsurgical 1979  ? followed by pcp  ? Leukocytosis   ? Nocturia   ? OA (osteoarthritis)   ? PONV (postoperative nausea and vomiting)   ? Since chemo (always  nauseous)  ? Pre-diabetes   ? Primary adenocarcinoma of upper lobe of right lung ([place])   ? oncologist--- shadad/  pulmonology-- dr byrum;  dx 02/ 2022;  s/p  SBRT 11-13-2020 to 11-26-2020  ? Urgency of urination   ? Wears partial dentures   ? lower  ? ?Past Surgical History:  ?Procedure Laterality Date  ? COLONOSCOPY  last one 2017  ? CYSTOSCOPY WITH BIOPSY Right 09/16/2021  ? Procedure: CYSTOSCOPY / URETEROSCOPY WITH BIOPSY/bugbee fulgeration/right retrograde;  Surgeon: Ceasar Mons, MD;  [facility];  Service: Urology;  Laterality: Right;  ? CYSTOSCOPY WITH URETEROSCOPY Right 04/18/2020  ? Procedure: CYSTOSCOPY WITH URETEROSCOPY, BIOPSY;  Surgeon: Ceasar Mons, MD;  [facility];  Service: Urology;  Laterality: Right;  ONLY NEEDS 45 MIN  ? CYSTOSCOPY WITH URETEROSCOPY AND STENT PLACEMENT N/A 12/31/2020  ? Procedure: CYSTOSCOPY WITH RIGHT URETEROSCOPY/ BILATERAL RETROGRADE PYELOGRAM/RIGHT URETERAL BIOPSY/ LASER ABLATION/RIGHT STENT PLACEMENT;  Surgeon: Ceasar Mons, MD;  Location: [facility];  Service: Urology;  Laterality: N/A;  ? CYSTOSCOPY/RETROGRADE/URETEROSCOPY Right 04/22/2021  ? Procedure: CYSTOSCOPY/RETROGRADE/URETEROSCOPY/ STENT PLACEMENT;  Surgeon: Ceasar Mons, MD;  Location: [facility];  Service: Urology;  Laterality: Right;  ? EYE SURGERY Bilateral 1987  ? tear ducts  ? IR IMAGING GUIDED PORT INSERTION  06/04/2020  ? THYROID LOBECTOMY Right 1979  ? TOTAL HIP ARTHROPLASTY Right 01/28/2016  ? Procedure: RIGHT TOTAL HIP ARTHROPLASTY ANTERIOR APPROACH;  Surgeon: [name], MD;  Location: [facility];  Service: Orthopedics;  Laterality: Right;  ? UMBILICAL HERNIA REPAIR  1980s  ? VIDEO BRONCHOSCOPY WITH ENDOBRONCHIAL NAVIGATION N/A 06/06/2020  ? Procedure: VIDEO BRONCHOSCOPY WITH ENDOBRONCHIAL NAVIGATION;  Surgeon: Collene Gobble, MD;  Location[facility];  Service: Thoracic;  Laterality: N/A;  ? VIDEO  BRONCHOSCOPY WITH ENDOBRONCHIAL NAVIGATION N/A 10/10/2020  ? Procedure: VIDEO BRONCHOSCOPY WITH ENDOBRONCHIAL NAVIGATION;  Surgeon: [name], MD;  Location: [facility];  Service: Thoracic;  Laterality: N/A;  ? ?Patient Active Problem List  ? Diagnosis Date Noted  ? Genetic testing 06/02/2021  ? Family history of breast cancer 05/04/2021  ? Family history of ovarian cancer 05/04/2021  ? Family history of colon cancer 05/04/2021  ? Vitamin D deficiency 03/20/2021  ? Primary cancer of right upper lobe of lung ([place]) 10/21/2020  ? Port-A-Cath in place 06/13/2020  ? Pulmonary nodule 06/03/2020  ? Cancer of renal pelvis, unspecified laterality ([place]) 05/26/2020  ? Chronic kidney disease, stage 3a ([place]) 12/31/2019  ? Prediabetes 12/31/2019  ? Obesity (BMI 30-39.9) 12/31/2019  ? Major depression, recurrent, chronic ([place]) 10/24/2019  ? Leg edema 10/24/2019  ? Medication intolerance 10/24/2019  ? Benign microscopic hematuria 09/17/2019  ? Myalgia due to statin 11/23/2018  ? Umbilical hernia 50/35/4656  ? Sessile colonic polyp 10/04/2017  ? Tobacco dependence 12/09/2015  ? Status post right hip replacement 07/21/2015  ? ACE inhibitor intolerance 06/17/2015  ? DJD (degenerative joint disease), lumbar 06/17/2015  ? Essential hypertension 06/17/2015  ? Mixed hyperlipidemia 06/17/2015  ? Morbid obesity ([place]) 06/17/2015  ? Post-menopause on HRT (hormone replacement therapy) 06/17/2015  ? Postoperative hypothyroidism 06/17/2015  ? Primary osteoarthritis involving multiple joints 06/17/2015  ? ?Progress Note ?Reporting Period 09/30/2021 to 11/17/2021 ? ?See note below for Objective Data and Assessment of Progress/Goals.  ? ? ?REFERRING DIAG: Chronic bilateral low back pain without sciatica ? ?THERAPY DIAG:  ?Difficulty in walking, not elsewhere classified ? ?Chronic low back pain, unspecified back pain laterality, unspecified whether sciatica present ? ?Muscle spasm of back ? ?Balance problem ? ?Muscle weakness  (generalized) ? ?PERTINENT HISTORY: HTN, Stage IIIa Chronic Kidney Disease, High-grade urothelial cancer and lung cancer ? ?PRECAUTIONS: none ? ?SUBJECTIVE: Pt reports she is about 50% better overall.  "I just hurt when I stand for too long"  Patient is requesting to hold on treatment for now.  Her daughter has been diagnosed with terminal cancer and she wants to spend as much time as possible with her.   ? ?PAIN:  ?Are you having pain? Yes ?NPRS scale: 3/10 ?Pain location: knee and low back ?Pain orientation: Lower  ?PAIN TYPE: aching and tight ?Pain description: intermittent  ?Aggravating factors: steps ?Relieving factors: rest ? ? ?OBJECTIVE:  ?  ?PATIENT SURVEYS:  ?12/10/21: FOTO 59% ? ?11/17/2021: ?Modified Oswestry Low Back Pain Disability Questionnaire: 17 / 50 = 34.0 % ? ?11/10/2021: ?FOTO 58% ? ?11/05/2021: ?Modified Oswestry Low Back Pain Disability Questionnaire: 24 / 50 = 48.0 % ? ?09/30/2021: ?Modified Oswestry 28 / 50 = 56.0 %  ?FOTO 48% ?  ?LUMBARAROM/PROM ?  ?A/ROM A/ROM  ?09/30/2021 A/ROM ?11/17/2021 A/ROM ?12/10/21  ?Flexion 50 70 80  ?Extension 30 30 35  ?  Right lateral flexion      ?Left lateral flexion      ?Right rotation      ?Left rotation      ? (Blank rows = not tested) ? ?LE MMT: ?  ?BLE strength grossly 4 to 4+/5 throughout (12/10/21 grossly 4+/5) ?  ?LUMBAR SPECIAL TESTS:  ?Straight leg raise test: Positive ?  ?FUNCTIONAL TESTS:  ? ?12/10/21: ?5 times sit to/from stand:  12.46 sec without UE use ?TUG 10.64 sec ? ?11/17/2021: ?5 times sit to/from stand:  14.3 sec without UE use ?BERG 47/56 ? ?10/14/21 ?BERG 43/56 ? ?09/30/2021 ?5 times sit to stand: 14.9 sec with limited UE use pushing up from thighs. ?Timed up and go (TUG): 11.8 sec ?  ?GAIT: ?Distance walked: 150 ft ?Assistive device utilized: None ?Level of assistance: Complete Independence ?Comments: antalgic gait pattern but very minimal ?  ? TODAY'S TREATMENT  ?  ?12/10/2021: ?Nustep L5 x7 min with PT present to discuss status ?Reviewed all of HEP  and issued updated HEP ?Hooklying PPT ?PPT with march x 20 ?PPT with weighted shoulder flexion to alternating knee touch x 20 (yellow plyo ball) ?PPT with weighted shoulder flexion to SLR x 20 (yellow plyo ball) ? ?12/03/2021

## 2021-12-15 ENCOUNTER — Inpatient Hospital Stay: Payer: Medicare HMO | Attending: Oncology

## 2021-12-15 ENCOUNTER — Other Ambulatory Visit: Payer: Self-pay

## 2021-12-15 DIAGNOSIS — Z452 Encounter for adjustment and management of vascular access device: Secondary | ICD-10-CM | POA: Insufficient documentation

## 2021-12-15 DIAGNOSIS — Z85118 Personal history of other malignant neoplasm of bronchus and lung: Secondary | ICD-10-CM | POA: Insufficient documentation

## 2021-12-15 DIAGNOSIS — Z8553 Personal history of malignant neoplasm of renal pelvis: Secondary | ICD-10-CM | POA: Diagnosis not present

## 2021-12-15 DIAGNOSIS — Z95828 Presence of other vascular implants and grafts: Secondary | ICD-10-CM

## 2021-12-15 MED ORDER — HEPARIN SOD (PORK) LOCK FLUSH 100 UNIT/ML IV SOLN
500.0000 [IU] | Freq: Once | INTRAVENOUS | Status: AC
  Administered 2021-12-15: 500 [IU]

## 2021-12-15 MED ORDER — SODIUM CHLORIDE 0.9% FLUSH
10.0000 mL | Freq: Once | INTRAVENOUS | Status: AC
  Administered 2021-12-15: 10 mL

## 2021-12-17 ENCOUNTER — Other Ambulatory Visit: Payer: Self-pay | Admitting: Urology

## 2021-12-17 DIAGNOSIS — N390 Urinary tract infection, site not specified: Secondary | ICD-10-CM | POA: Diagnosis not present

## 2021-12-17 DIAGNOSIS — C651 Malignant neoplasm of right renal pelvis: Secondary | ICD-10-CM | POA: Diagnosis not present

## 2021-12-22 ENCOUNTER — Encounter: Payer: Self-pay | Admitting: Rehabilitative and Restorative Service Providers"

## 2021-12-24 ENCOUNTER — Encounter: Payer: Self-pay | Admitting: Rehabilitative and Restorative Service Providers"

## 2021-12-30 ENCOUNTER — Other Ambulatory Visit: Payer: Self-pay | Admitting: *Deleted

## 2021-12-30 DIAGNOSIS — Z95828 Presence of other vascular implants and grafts: Secondary | ICD-10-CM

## 2021-12-30 MED ORDER — LIDOCAINE-PRILOCAINE 2.5-2.5 % EX CREA: 1.0000 "application " | TOPICAL_CREAM | CUTANEOUS | 0 refills | Status: DC | PRN

## 2021-12-31 ENCOUNTER — Encounter: Payer: Self-pay | Admitting: Rehabilitative and Restorative Service Providers"

## 2022-01-05 ENCOUNTER — Encounter: Payer: Self-pay | Admitting: Rehabilitative and Restorative Service Providers"

## 2022-01-06 DIAGNOSIS — H2513 Age-related nuclear cataract, bilateral: Secondary | ICD-10-CM | POA: Diagnosis not present

## 2022-01-06 DIAGNOSIS — H02831 Dermatochalasis of right upper eyelid: Secondary | ICD-10-CM | POA: Diagnosis not present

## 2022-01-06 DIAGNOSIS — H16223 Keratoconjunctivitis sicca, not specified as Sjogren's, bilateral: Secondary | ICD-10-CM | POA: Diagnosis not present

## 2022-01-06 DIAGNOSIS — H0102B Squamous blepharitis left eye, upper and lower eyelids: Secondary | ICD-10-CM | POA: Diagnosis not present

## 2022-01-06 LAB — HM DIABETES EYE EXAM

## 2022-01-07 ENCOUNTER — Encounter: Payer: Self-pay | Admitting: Rehabilitative and Restorative Service Providers"

## 2022-01-12 ENCOUNTER — Encounter: Payer: Self-pay | Admitting: Rehabilitative and Restorative Service Providers"

## 2022-01-14 ENCOUNTER — Encounter: Payer: Self-pay | Admitting: Rehabilitative and Restorative Service Providers"

## 2022-01-22 ENCOUNTER — Encounter (HOSPITAL_BASED_OUTPATIENT_CLINIC_OR_DEPARTMENT_OTHER): Payer: Self-pay | Admitting: Urology

## 2022-01-26 ENCOUNTER — Other Ambulatory Visit: Payer: Self-pay | Admitting: Nurse Practitioner

## 2022-01-26 NOTE — Anesthesia Preprocedure Evaluation (Signed)
Anesthesia Evaluation  Patient identified by MRN, date of birth, ID band Patient awake    Reviewed: Allergy & Precautions, NPO status , Patient's Chart, lab work & pertinent test results  History of Anesthesia Complications (+) PONV and history of anesthetic complications  Airway Mallampati: II  TM Distance: >3 FB Neck ROM: Full    Dental  (+) Partial Upper, Partial Lower, Dental Advisory Given, Missing   Pulmonary COPD,  COPD inhaler, former smoker,  RUL adenocarcinoma: XRT   Pulmonary exam normal        Cardiovascular hypertension, Pt. on medications Normal cardiovascular exam     Neuro/Psych PSYCHIATRIC DISORDERS Depression negative neurological ROS     GI/Hepatic Neg liver ROS, GERD  Controlled,  Endo/Other  Hypothyroidism Morbid obesity  Renal/GU Renal InsufficiencyRenal diseaseRenal carcinoma: chemo     Musculoskeletal  (+) Arthritis ,   Abdominal (+) + obese,   Peds  Hematology negative hematology ROS (+)   Anesthesia Other Findings   Reproductive/Obstetrics                            Anesthesia Physical  Anesthesia Plan  ASA: 3  Anesthesia Plan: General   Post-op Pain Management: Tylenol PO (pre-op) and Minimal or no pain anticipated   Induction: Intravenous  PONV Risk Score and Plan: 4 or greater and Ondansetron, Dexamethasone, Treatment may vary due to age or medical condition and Diphenhydramine  Airway Management Planned: LMA  Additional Equipment: None  Intra-op Plan:   Post-operative Plan: Extubation in OR  Informed Consent: I have reviewed the patients History and Physical, chart, labs and discussed the procedure including the risks, benefits and alternatives for the proposed anesthesia with the patient or authorized representative who has indicated his/her understanding and acceptance.     Dental advisory given  Plan Discussed with: Anesthesiologist and  CRNA  Anesthesia Plan Comments:        Anesthesia Quick Evaluation

## 2022-01-27 ENCOUNTER — Encounter (HOSPITAL_BASED_OUTPATIENT_CLINIC_OR_DEPARTMENT_OTHER): Payer: Self-pay | Admitting: Urology

## 2022-01-27 ENCOUNTER — Ambulatory Visit (HOSPITAL_BASED_OUTPATIENT_CLINIC_OR_DEPARTMENT_OTHER): Payer: Medicare HMO | Admitting: Anesthesiology

## 2022-01-27 ENCOUNTER — Encounter (HOSPITAL_BASED_OUTPATIENT_CLINIC_OR_DEPARTMENT_OTHER)
Admission: RE | Disposition: A | Discharge status: Home / Self Care | Payer: Self-pay | Source: Home / Self Care | Attending: Urology

## 2022-01-27 ENCOUNTER — Other Ambulatory Visit: Payer: Self-pay

## 2022-01-27 ENCOUNTER — Ambulatory Visit (HOSPITAL_BASED_OUTPATIENT_CLINIC_OR_DEPARTMENT_OTHER)
Admission: RE | Admit: 2022-01-27 | Discharge: 2022-01-27 | Disposition: A | Discharge status: Home / Self Care | Payer: Medicare HMO | Attending: Urology | Admitting: Urology

## 2022-01-27 DIAGNOSIS — Z87891 Personal history of nicotine dependence: Secondary | ICD-10-CM | POA: Diagnosis not present

## 2022-01-27 DIAGNOSIS — Z923 Personal history of irradiation: Secondary | ICD-10-CM | POA: Diagnosis not present

## 2022-01-27 DIAGNOSIS — E039 Hypothyroidism, unspecified: Secondary | ICD-10-CM | POA: Diagnosis not present

## 2022-01-27 DIAGNOSIS — J449 Chronic obstructive pulmonary disease, unspecified: Secondary | ICD-10-CM | POA: Insufficient documentation

## 2022-01-27 DIAGNOSIS — Z8041 Family history of malignant neoplasm of ovary: Secondary | ICD-10-CM | POA: Diagnosis not present

## 2022-01-27 DIAGNOSIS — D494 Neoplasm of unspecified behavior of bladder: Secondary | ICD-10-CM

## 2022-01-27 DIAGNOSIS — C688 Malignant neoplasm of overlapping sites of urinary organs: Secondary | ICD-10-CM | POA: Diagnosis not present

## 2022-01-27 DIAGNOSIS — C3490 Malignant neoplasm of unspecified part of unspecified bronchus or lung: Secondary | ICD-10-CM | POA: Diagnosis not present

## 2022-01-27 DIAGNOSIS — I10 Essential (primary) hypertension: Secondary | ICD-10-CM | POA: Diagnosis not present

## 2022-01-27 DIAGNOSIS — Z8553 Personal history of malignant neoplasm of renal pelvis: Secondary | ICD-10-CM | POA: Diagnosis not present

## 2022-01-27 DIAGNOSIS — C689 Malignant neoplasm of urinary organ, unspecified: Secondary | ICD-10-CM

## 2022-01-27 DIAGNOSIS — Z9221 Personal history of antineoplastic chemotherapy: Secondary | ICD-10-CM | POA: Diagnosis not present

## 2022-01-27 DIAGNOSIS — C679 Malignant neoplasm of bladder, unspecified: Secondary | ICD-10-CM | POA: Diagnosis not present

## 2022-01-27 DIAGNOSIS — Z6841 Body Mass Index (BMI) 40.0 and over, adult: Secondary | ICD-10-CM | POA: Insufficient documentation

## 2022-01-27 DIAGNOSIS — R896 Abnormal cytological findings in specimens from other organs, systems and tissues: Secondary | ICD-10-CM | POA: Diagnosis not present

## 2022-01-27 DIAGNOSIS — Z01818 Encounter for other preprocedural examination: Secondary | ICD-10-CM

## 2022-01-27 DIAGNOSIS — D090 Carcinoma in situ of bladder: Secondary | ICD-10-CM | POA: Diagnosis not present

## 2022-01-27 HISTORY — PX: CYSTOSCOPY W/ URETERAL STENT PLACEMENT: SHX1429

## 2022-01-27 LAB — POCT I-STAT, CHEM 8
BUN: 27 mg/dL — ABNORMAL HIGH (ref 8–23)
Calcium, Ion: 1.13 mmol/L — ABNORMAL LOW (ref 1.15–1.40)
Chloride: 101 mmol/L (ref 98–111)
Creatinine, Ser: 1.5 mg/dL — ABNORMAL HIGH (ref 0.44–1.00)
Glucose, Bld: 125 mg/dL — ABNORMAL HIGH (ref 70–99)
HCT: 40 % (ref 36.0–46.0)
Hemoglobin: 13.6 g/dL (ref 12.0–15.0)
Potassium: 3.7 mmol/L (ref 3.5–5.1)
Sodium: 139 mmol/L (ref 135–145)
TCO2: 27 mmol/L (ref 22–32)

## 2022-01-27 SURGERY — CYSTOSCOPY, WITH RETROGRADE PYELOGRAM AND URETERAL STENT INSERTION
Anesthesia: General | Site: Pelvis | Laterality: Right

## 2022-01-27 MED ORDER — EPHEDRINE SULFATE (PRESSORS) 50 MG/ML IJ SOLN
INTRAMUSCULAR | Status: DC | PRN
  Administered 2022-01-27 (×2): 10 mg via INTRAVENOUS
  Administered 2022-01-27: 5 mg via INTRAVENOUS

## 2022-01-27 MED ORDER — FENTANYL CITRATE (PF) 100 MCG/2ML IJ SOLN
INTRAMUSCULAR | Status: AC
  Filled 2022-01-27: qty 2

## 2022-01-27 MED ORDER — STERILE WATER FOR IRRIGATION IR SOLN
Status: DC | PRN
  Administered 2022-01-27: 3000 mL

## 2022-01-27 MED ORDER — ONDANSETRON HCL 4 MG/2ML IJ SOLN
INTRAMUSCULAR | Status: DC | PRN
  Administered 2022-01-27: 4 mg via INTRAVENOUS

## 2022-01-27 MED ORDER — DEXAMETHASONE SODIUM PHOSPHATE 10 MG/ML IJ SOLN
INTRAMUSCULAR | Status: AC
  Filled 2022-01-27: qty 1

## 2022-01-27 MED ORDER — PROPOFOL 10 MG/ML IV BOLUS
INTRAVENOUS | Status: AC
  Filled 2022-01-27: qty 20

## 2022-01-27 MED ORDER — CIPROFLOXACIN IN D5W 400 MG/200ML IV SOLN
400.0000 mg | Freq: Once | INTRAVENOUS | Status: AC
  Administered 2022-01-27: 400 mg via INTRAVENOUS

## 2022-01-27 MED ORDER — FENTANYL CITRATE (PF) 100 MCG/2ML IJ SOLN: 25.0000 ug | INTRAMUSCULAR | Status: DC | PRN

## 2022-01-27 MED ORDER — LIDOCAINE HCL (CARDIAC) PF 100 MG/5ML IV SOSY
PREFILLED_SYRINGE | INTRAVENOUS | Status: DC | PRN
  Administered 2022-01-27: 100 mg via INTRAVENOUS

## 2022-01-27 MED ORDER — SODIUM CHLORIDE 0.9 % IV SOLN: INTRAVENOUS | Status: DC

## 2022-01-27 MED ORDER — OXYCODONE-ACETAMINOPHEN 5-325 MG PO TABS: 1.0000 | ORAL_TABLET | ORAL | 0 refills | Status: DC | PRN

## 2022-01-27 MED ORDER — LIDOCAINE HCL (PF) 2 % IJ SOLN
INTRAMUSCULAR | Status: AC
  Filled 2022-01-27: qty 5

## 2022-01-27 MED ORDER — OXYBUTYNIN CHLORIDE 5 MG PO TABS: 5.0000 mg | ORAL_TABLET | Freq: Three times a day (TID) | ORAL | 1 refills | Status: DC | PRN

## 2022-01-27 MED ORDER — EPHEDRINE 5 MG/ML INJ
INTRAVENOUS | Status: AC
  Filled 2022-01-27: qty 5

## 2022-01-27 MED ORDER — CIPROFLOXACIN HCL 500 MG PO TABS: 500.0000 mg | ORAL_TABLET | Freq: Two times a day (BID) | ORAL | 0 refills | Status: AC

## 2022-01-27 MED ORDER — DEXAMETHASONE SODIUM PHOSPHATE 4 MG/ML IJ SOLN
INTRAMUSCULAR | Status: DC | PRN
  Administered 2022-01-27: 10 mg via INTRAVENOUS

## 2022-01-27 MED ORDER — PROPOFOL 10 MG/ML IV BOLUS
INTRAVENOUS | Status: DC | PRN
  Administered 2022-01-27: 150 mg via INTRAVENOUS
  Administered 2022-01-27: 100 mg via INTRAVENOUS
  Administered 2022-01-27: 50 mg via INTRAVENOUS

## 2022-01-27 MED ORDER — ACETAMINOPHEN 500 MG PO TABS
1000.0000 mg | ORAL_TABLET | Freq: Once | ORAL | Status: AC
  Administered 2022-01-27: 1000 mg via ORAL

## 2022-01-27 MED ORDER — SODIUM CHLORIDE 0.9 % IR SOLN
Status: DC | PRN
  Administered 2022-01-27: 3000 mL

## 2022-01-27 MED ORDER — FENTANYL CITRATE (PF) 100 MCG/2ML IJ SOLN
INTRAMUSCULAR | Status: DC | PRN
  Administered 2022-01-27: 50 ug via INTRAVENOUS
  Administered 2022-01-27: 100 ug via INTRAVENOUS

## 2022-01-27 MED ORDER — CIPROFLOXACIN IN D5W 400 MG/200ML IV SOLN
INTRAVENOUS | Status: AC
  Filled 2022-01-27: qty 200

## 2022-01-27 MED ORDER — PROMETHAZINE HCL 25 MG/ML IJ SOLN: 6.2500 mg | INTRAMUSCULAR | Status: DC | PRN

## 2022-01-27 MED ORDER — IOHEXOL 300 MG/ML  SOLN
INTRAMUSCULAR | Status: DC | PRN
  Administered 2022-01-27: 10 mL

## 2022-01-27 MED ORDER — AMISULPRIDE (ANTIEMETIC) 5 MG/2ML IV SOLN: 10.0000 mg | Freq: Once | INTRAVENOUS | Status: DC | PRN

## 2022-01-27 MED ORDER — ACETAMINOPHEN 500 MG PO TABS
ORAL_TABLET | ORAL | Status: AC
  Filled 2022-01-27: qty 2

## 2022-01-27 MED ORDER — PHENAZOPYRIDINE HCL 200 MG PO TABS: 200.0000 mg | ORAL_TABLET | Freq: Three times a day (TID) | ORAL | 0 refills | Status: DC | PRN

## 2022-01-27 SURGICAL SUPPLY — 27 items
APL SKNCLS STERI-STRIP NONHPOA (GAUZE/BANDAGES/DRESSINGS)
BAG DRAIN URO-CYSTO SKYTR STRL (DRAIN) ×2 IMPLANT
BAG DRN UROCATH (DRAIN) ×1
BASKET STONE 1.7 NGAGE (UROLOGICAL SUPPLIES) IMPLANT
BASKET ZERO TIP NITINOL 2.4FR (BASKET) ×2 IMPLANT
BENZOIN TINCTURE PRP APPL 2/3 (GAUZE/BANDAGES/DRESSINGS) IMPLANT
BSKT STON RTRVL ZERO TP 2.4FR (BASKET) ×1
CATH URET 5FR 28IN OPEN ENDED (CATHETERS) IMPLANT
CLOTH BEACON ORANGE TIMEOUT ST (SAFETY) ×2 IMPLANT
FIBER LASER FLEXIVA 365 (UROLOGICAL SUPPLIES) IMPLANT
GLOVE BIO SURGEON STRL SZ7.5 (GLOVE) ×2 IMPLANT
GOWN STRL REUS W/TWL XL LVL3 (GOWN DISPOSABLE) ×2 IMPLANT
GUIDEWIRE STR DUAL SENSOR (WIRE) IMPLANT
GUIDEWIRE ZIPWRE .038 STRAIGHT (WIRE) ×2 IMPLANT
IV NS IRRIG 3000ML ARTHROMATIC (IV SOLUTION) ×3 IMPLANT
KIT TURNOVER CYSTO (KITS) ×2 IMPLANT
MANIFOLD NEPTUNE II (INSTRUMENTS) ×2 IMPLANT
NS IRRIG 500ML POUR BTL (IV SOLUTION) ×2 IMPLANT
PACK CYSTO (CUSTOM PROCEDURE TRAY) ×2 IMPLANT
STENT URET 6FRX24 CONTOUR (STENTS) ×1 IMPLANT
STRIP CLOSURE SKIN 1/2X4 (GAUZE/BANDAGES/DRESSINGS) IMPLANT
SYR 10ML LL (SYRINGE) ×2 IMPLANT
TRACTIP FLEXIVA PULS ID 200XHI (Laser) IMPLANT
TRACTIP FLEXIVA PULSE ID 200 (Laser)
TUBE CONNECTING 12X1/4 (SUCTIONS) IMPLANT
TUBING UROLOGY SET (TUBING) ×2 IMPLANT
WATER STERILE IRR 3000ML UROMA (IV SOLUTION) ×1 IMPLANT

## 2022-01-27 NOTE — Addendum Note (Signed)
Addendum  created 01/27/22 1327 by Justice Rocher, CRNA   Charge Capture section accepted

## 2022-01-27 NOTE — Discharge Instructions (Signed)
NO TYLENOL PRODUCTS UNTIL 12:00 PM TODAY.    [facility] [phone] Post Ureteroscopy With or Without Stent Instructions  Definitions:  Ureter: The duct that transports urine from the kidney to the bladder. Stent:   A plastic hollow tube that is placed into the ureter, from the kidney to the bladder to prevent the ureter from swelling shut.  GENERAL INSTRUCTIONS:  Despite the fact that no skin incisions were used, the area around the ureter and bladder is raw and irritated. The stent is a foreign body which will further irritate the bladder wall. This irritation is manifested by increased frequency of urination, both day and night, and by an increase in the urge to urinate. In some, the urge to urinate is present almost always. Sometimes the urge is strong enough that you may not be able to stop yourself from urinating. The only real cure is to remove the stent and then give time for the bladder wall to heal which can't be done until the danger of the ureter swelling shut has passed, which varies.  You may see some blood in your urine while the stent is in place and a few days afterwards. Do not be alarmed, even if the urine was clear for a while. Get off your feet and drink lots of fluids until clearing occurs. If you start to pass clots or don't improve, call us.  DIET: You may return to your normal diet immediately. Because of the raw surface of your bladder, alcohol, spicy foods, acid type foods and drinks with caffeine may cause irritation or frequency and should be used in moderation. To keep your urine flowing freely and to avoid constipation, drink plenty of fluids during the day ( 8-10 glasses ). Tip: Avoid cranberry juice because it is very acidic.  ACTIVITY: Your physical activity doesn't need to be restricted. However, if you are very active, you may see some blood in your urine. We suggest that you reduce your activity under these circumstances until the  bleeding has stopped.  BOWELS: It is important to keep your bowels regular during the postoperative period. Straining with bowel movements can cause bleeding. A bowel movement every other day is reasonable. Use a mild laxative if needed, such as Milk of Magnesia 2-3 tablespoons, or 2 Dulcolax tablets. Call if you continue to have problems. If you have been taking narcotics for pain, before, during or after your surgery, you may be constipated. Take a laxative if necessary.   MEDICATION: You should resume your pre-surgery medications unless told not to. In addition you will often be given an antibiotic to prevent infection. These should be taken as prescribed until the bottles are finished unless you are having an unusual reaction to one of the drugs.  PROBLEMS YOU SHOULD REPORT TO [name]: Fevers over 100.5 Fahrenheit. Heavy bleeding, or clots ( See above notes about blood in urine ). Inability to urinate. Drug reactions ( hives, rash, nausea, vomiting, diarrhea ). Severe burning or pain with urination that is not improving.  FOLLOW-UP: You will need a follow-up appointment to monitor your progress. Call for this appointment at the number listed above. Usually the first appointment will be about three to fourteen days after your surgery.      Post Anesthesia Home Care Instructions  Activity: Get plenty of rest for the remainder of the day. A responsible individual must stay with you for 24 hours following the procedure.  For the next 24 hours, DO NOT: -Drive a car -Operate  machinery -Drink alcoholic beverages -Take any medication unless instructed by your physician -Make any legal decisions or sign important papers.  Meals: Start with liquid foods such as gelatin or soup. Progress to regular foods as tolerated. Avoid greasy, spicy, heavy foods. If nausea and/or vomiting occur, drink only clear liquids until the nausea and/or vomiting subsides. Call your physician if vomiting  continues.  Special Instructions/Symptoms: Your throat may feel dry or sore from the anesthesia or the breathing tube placed in your throat during surgery. If this causes discomfort, gargle with warm salt water. The discomfort should disappear within 24 hours.

## 2022-01-27 NOTE — Transfer of Care (Signed)
Immediate Anesthesia Transfer of Care Note  Patient: Maria Marquez  Procedure(s) Performed: Procedure(s) (LRB): CYSTOSCOPY WITH RETROGRADE PYELOGRAM/ URETEROSCOPY/POSSIBLE BIOPSY/ POSSIBLE  URETERAL STENT PLACEMENT (Right)  Patient Location: PACU  Anesthesia Type: General  Level of Consciousness: awake, sedated, patient cooperative and responds to stimulation  Airway & Oxygen Therapy: Patient Spontanous Breathing   Post-op Assessment: Report given to PACU RN, Post -op Vital signs reviewed and stable and Patient moving all extremities  Post vital signs: Reviewed and stable  Complications: No apparent anesthesia complications  Last Vitals:  Vitals Value Taken Time  BP 137/57 01/27/22 0936  Temp    Pulse 78 01/27/22 0942  Resp 18 01/27/22 0942  SpO2 90 % 01/27/22 0942  Vitals shown include unvalidated device data.  Last Pain:  Vitals:   01/27/22 0636  TempSrc: Oral         Complications: No notable events documented.

## 2022-01-27 NOTE — H&P (Signed)
Office Visit Report     12/17/2021   --------------------------------------------------------------------------------   Maria Marquez  MRN: [id number]  DOB: 1946-01-11, 76 year old Female  SSN:    PRIMARY CARE:  Maria Marquez  REFERRING:  Maria Marquez  PROVIDER:  Bjorn Loser, M.D.  TREATING:  Maria Hughs, M.D.  LOCATION:  [facility] [phone]     --------------------------------------------------------------------------------   CC/HPI: CC: Right ureteral lesion   HPI: Maria Marquez is a 76 year old female, referred by Maria Marquez after she was found to have a 2.7 cm solid and enhancing lesion in the proximal aspects of the right ureter on CT during a work-up for gross hematuria. She was also noted to have a 13 mm left renal lesion, likely representing a proteinaceous/hyperdense cyst. She is s/p diagnostic right ureteroscopy on 04/18/20 and was found to have multiple large papillary tumors throughout the right renal pelvis/UPJ. 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 She is s/p gem/cis chemotherapy and radiation to the thorax. Negative cysto/ureteroscopy on 09/16/21.   11/24/20: The patient is here today for a routine follow-up. She has completed chemotherapy and is near completion of radiation therapy for her primary lung cancer. PET CT from January revealed no intra-abdominal lesions and no GU abnormalities. She is urinating without difficulty and denies interval UTIs, dysuria or hematuria. Of note, since starting chemotherapy, her renal function has progressively declined with her last eGFR being 38 (serum creatinine- 1.45).   01/16/21: The patient is here today for a routine follow-up and stent removal. She is s/p right URS with ablation of a 1-2 cm papillary UCC on 12/31/20. Pathology showed low grade, detached UCC. She has done  well post-op with her only complaint being urinary urgency and frequency q 2 hours, which were present months prior to surgery. She denies interval UTIs, fever/chills or significant flank pain.   07/23/21: The patient is here today for a routine follow-up and to plan her next surveillance ureteroscopy. Doing well and reports no new major health issues. Urge incontinence has worsened--wearing 3-4 ppd. Frequency q 2-2.5 hours. Minimal SUI sxs. Denies interval UTIs, dysuria or hematuria.   09/02/2021: 76 year old female who presents today for preoperative appointment. She denies any changes to her voiding habits, shortness of breath, chest pain, changes to her medications. She has been having frequent nosebleeds and does endorse that they can be quite severe at times. She has not seen an ENT for this. She reports her blood pressures have been at her normal.. She has no questions today regarding her upcoming ureteroscopy. She denies interval UTIs, dysuria, hematuria.   12/17/21: The patient is here today for a routine follow-up. She denies interval episodes of hematuria or dysuria. She does report some urinary hesitancy, but is not bothered by it. Her daughter is currently being treated for refractory metastatic ovarian cancer and this is weighing heavily on her.     ALLERGIES: Cephalexin IVP Dye - Trouble Breathing, Other Reaction, Tightness in throat Shellfish    MEDICATIONS: Aspirin 81 mg tablet,chewable  Lipitor  Myrbetriq 50 mg tablet, extended release 24 hr 1 tablet PO Daily  Simvastatin 10 mg tablet  Estradiol-Norethindrone Acetat 1 mg-0.5 mg tablet  Ezetimibe 10 mg tablet  Levothyroxine 137 mcg capsule  Losartan-Hydrochlorothiazide 50 mg-12.5 mg tablet  Ondansetron Odt 4 mg tablet,disintegrating 1 tablet PO Q 4 H PRN  GU PSH: Cysto Bladder Ureth Biopsy - 09/16/2021 Cysto Remove Stent FB Sim - 01/16/2021 Cysto Uretero Biopsy Fulgura, Right - 04/18/2020 Cysto Uretero W/excise Tumor, Right  - 12/31/2020 Cystoscopy - 03/21/2020 Cystoscopy Ureteroscopy - 09/16/2021, 04/22/2021 Locm 300-399Mg/Ml Iodine,1Ml - 03/19/2020     NON-GU PSH: Hip Replacement, Right - 2017 Thyroid Surgery, thyroid lobe removal - 1979     GU PMH: Renal pelvis cancer, right - 09/02/2021, - 07/23/2021, - 04/13/2021, - 01/16/2021, - 05/14/2020, - 04/25/2020 Urge incontinence - 07/23/2021 Microscopic hematuria - 04/13/2021 Nocturia (Stable) - 01/16/2021, - 03/18/2020 Urinary Urgency - 01/16/2021 Chronic kidney disease stage 3 (GFR 30-60) - 11/24/2020 Right renal neoplasm - 11/24/2020, - 04/03/2020 Gross hematuria - 04/25/2020, (Stable), - 04/03/2020, - 03/19/2020 Hemorrhagic cystitis - 03/18/2020 Urinary Frequency - 03/18/2020    NON-GU PMH: Arthritis GERD Hypercholesterolemia Hypertension Hypothyroidism    FAMILY HISTORY: 1 Daughter - Daughter 1 son - Son Breast Cancer - Mother Diabetes - Father father deceased - Father mother deceased - Mother    Notes: Mother deceased at age 37 d/t breast cancer  Father deceased at age 64 d/t diabetes   SOCIAL HISTORY: Marital Status: Widowed Current Smoking Status: Patient smokes. Has smoked since 02/20/1990. Smokes 1/2 pack per day.   Tobacco Use Assessment Completed: Used Tobacco in last 30 days? Does not drink anymore.  Drinks 2 caffeinated drinks per day.    REVIEW OF SYSTEMS:    GU Review Female:   Patient denies frequent urination, hard to postpone urination, burning /pain with urination, get up at night to urinate, leakage of urine, stream starts and stops, trouble starting your stream, have to strain to urinate, and being pregnant.  Gastrointestinal (Upper):   Patient denies nausea, vomiting, and indigestion/ heartburn.  Gastrointestinal (Lower):   Patient denies diarrhea and constipation.  Constitutional:   Patient denies weight loss, fever, fatigue, and night sweats.  Skin:   Patient denies skin rash/ lesion and itching.  Eyes:   Patient denies blurred vision and  double vision.  Ears/ Nose/ Throat:   Patient denies sore throat and sinus problems.  Hematologic/Lymphatic:   Patient denies swollen glands and easy bruising.  Cardiovascular:   Patient denies leg swelling and chest pains.  Respiratory:   Patient denies cough and shortness of breath.  Endocrine:   Patient denies excessive thirst.  Musculoskeletal:   Patient denies back pain and joint pain.  Neurological:   Patient denies headaches and dizziness.  Psychologic:   Patient denies depression and anxiety.   VITAL SIGNS:      12/17/2021 08:20 AM  Weight 273 lb / 123.83 kg  Height 66 in / 167.64 cm  BP 124/75 mmHg  Pulse 74 /min  Temperature 96.6 F / 35.8 C  BMI 44.1 kg/m   MULTI-SYSTEM PHYSICAL EXAMINATION:    Constitutional: Well-nourished. No physical deformities. Normally developed. Good grooming.  Neck: Neck symmetrical, not swollen. Normal tracheal position.  Respiratory: Normal breath sounds. No labored breathing, no use of accessory muscles.   Cardiovascular: Normal temperature, normal extremity pulses, no swelling, no varicosities.  Skin: No paleness, no jaundice, no cyanosis. No lesion, no ulcer, no rash.  Neurologic / Psychiatric: Oriented to time, oriented to place, oriented to person. No depression, no anxiety, no agitation.  Gastrointestinal: No mass, no tenderness, no rigidity, non obese abdomen.  Ears, Nose, Mouth, and Throat: Left ear no scars, no lesions, no masses. Right ear no scars, no lesions, no masses. Nose no scars, no lesions, no masses. Normal hearing. Normal  lips.  Musculoskeletal: Normal gait and station of head and neck.     Complexity of Data:  Records Review:   Pathology Reports, Previous Patient Records   PROCEDURES:          Urinalysis - 81003 Dipstick Dipstick Cont'd Micro  Specimen: Voided Bilirubin: Neg WBC/hpf: 6 - 10/hpf  Color: Yellow Ketones: Neg RBC/hpf: 0 - 2/hpf  Appearance: Slightly Cloudy Blood: 2+ Bacteria: Rare  Specific Gravity:  1.025 Protein: Trace Cystals: NS (Not Seen)  pH: 5.5 Urobilinogen: 1.0 Casts: NS (Not Seen)  Glucose: Neg Nitrites: Neg Trichomonas: Not Present    Leukocyte Esterase: 2+ Mucous: Not Present      Epithelial Cells: 6 - 10/hpf      Yeast: NS (Not Seen)      Sperm: Not Present    ASSESSMENT:      ICD-10 Details  1 GU:   Renal pelvis cancer, 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  Risk include, but are not limited to, bleeding, urinary tract infection, ureteral stricture formation, the need for an indwelling ureteral stent, MI, CVA, DVT and the inherent risk of general anesthesia.  She voices understanding and wishes to proceed.

## 2022-01-27 NOTE — Op Note (Addendum)
Operative Note  Preoperative diagnosis:  1.  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  Multiple 5 mm papillary bladder tumor recurrences with features concerning for urothelial carcinoma were seen within the right renal pelvis, right ureter and within the bladder.   Procedure(s): 1.  Cystoscopy with right ureteroscopy, right ureteral stent placement 2.  Bladder biopsy with fulguration 3.  Bilateral retrograde pyelograms with intraoperative interpretation fluoroscopic imaging  Surgeon: Ellison Hughs, MD  Assistants:  None  Anesthesia:  General  Complications:  None  EBL: Less than 5 mL  Specimens: 1.  Right renal washings 2.  Bladder biopsy  Drains/Catheters: 1.  Right 6 French, 24 cm JJ stent without tether  Intraoperative findings:   Multiple 5 mm papillary bladder tumor recurrences with features concerning for urothelial carcinoma were seen within the right renal pelvis, right ureter and within the bladder.  Solitary right collecting system with no filling defects or dilation involving the right ureter or right renal pelvis seen on retrograde pyelogram Solitary left collecting system with no filling defects or dilation involving the left ureter or left renal pelvis seen on retrograde pyelogram   Indication:  Maria Marquez is a 76 y.o. 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  She underwent gemcitabine and cisplatin chemotherapy in 2021 which temporarily eradicated her right upper tract urothelial carcinoma.  She is here today for surveillance cystoscopy and right ureteroscopy.  She has been consented for the procedure and understands to proceed.  Description of procedure:  After informed consent was obtained, the patient was brought to the operating room and general LMA anesthesia was administered. The patient was then placed in the  dorsolithotomy position and prepped and draped in the usual sterile fashion. A timeout was performed. A 23 French rigid cystoscope was then inserted into the urethral meatus and advanced into the bladder under direct vision. A complete bladder survey revealed a 5 mm papillary bladder tumor adjacent to the right ureteral orifice.  A 5 French ureteral catheter was then inserted into the right ureteral orifice and a retrograde pyelogram was obtained, with the findings listed above.  A Glidewire was then used to intubate the lumen of the ureteral catheter and was advanced up to the right renal pelvis, under fluoroscopic guidance.  The catheter was then removed, leaving the wire in place.  A flexible ureteroscope was then advanced alongside the wire and up to the right renal pelvis.  A full inspection of the renal pelvis revealed a superficial 5 mm papillary bladder tumor that became detached with manipulation.  I obtained washings through the ureteroscope from the right renal pelvis.  The flexible ureteroscope was then retracted and she was found to have multiple papillary bladder tumors along the mid and distal portions of the right ureter.  I then used Bugbee electrocautery to fulgurate these lesions until no visible tumor could be identified.  The flexible ureteroscope was then removed and exchanged for a rigid cystoscope.  I then placed a 6 [place], 24 cm JJ stent into good position within the right collecting system, confirming placement via fluoroscopy.  A cold cup biopsy was then obtained of her 5 mm bladder tumor that was adjacent to the right ureteral orifice.  The area that was biopsied was then successfully fulgurated with electrocautery until hemostasis was achieved.  There were no other identifiable papillary lesions in the bladder sample.  A 5  [place] ureteral catheter was then inserted into the left ureteral orifice and a retrograde pyelogram was obtained, with the findings listed above.    The  patient's bladder was then drained.  She tolerated the procedure well and was transferred to the postanesthesia in stable condition.  Plan: Follow-up in 1 week for office cystoscopy and stent removal.

## 2022-01-27 NOTE — Anesthesia Postprocedure Evaluation (Signed)
Anesthesia Post Note  Patient: Maria Marquez  Procedure(s) Performed: CYSTOSCOPY WITH RETROGRADE PYELOGRAM/ URETEROSCOPY/POSSIBLE BIOPSY/ POSSIBLE  URETERAL STENT PLACEMENT (Right: Pelvis)     Patient location during evaluation: PACU Anesthesia Type: General Level of consciousness: sedated Pain management: pain level controlled Vital Signs Assessment: post-procedure vital signs reviewed and stable Respiratory status: spontaneous breathing and respiratory function stable Cardiovascular status: stable Postop Assessment: no apparent nausea or vomiting Anesthetic complications: no   No notable events documented.  Last Vitals:  Vitals:   01/27/22 1000 01/27/22 1015  BP: 114/62 (!) 116/55  Pulse: 69 67  Resp: 14 14  Temp:    SpO2: 96% 94%    Last Pain:  Vitals:   01/27/22 1000  TempSrc:   PainSc: 5                  Kenyen Candy DANIEL

## 2022-01-27 NOTE — Anesthesia Procedure Notes (Signed)
Procedure Name: LMA Insertion Date/Time: 01/27/2022 8:48 AM Performed by: Justice Rocher, CRNA Pre-anesthesia Checklist: Patient identified, Emergency Drugs available, Suction available, Patient being monitored and Timeout performed Patient Re-evaluated:Patient Re-evaluated prior to induction Oxygen Delivery Method: Circle system utilized Preoxygenation: Pre-oxygenation with 100% oxygen Induction Type: IV induction Ventilation: Mask ventilation without difficulty LMA: LMA inserted LMA Size: 4.0 Number of attempts: 1 Airway Equipment and Method: Bite block Placement Confirmation: positive ETCO2, breath sounds checked- equal and bilateral and CO2 detector Tube secured with: Tape Dental Injury: Teeth and Oropharynx as per pre-operative assessment  Comments: Poor teeth condition - caution taken w/ LMA -placement

## 2022-01-28 ENCOUNTER — Encounter (HOSPITAL_BASED_OUTPATIENT_CLINIC_OR_DEPARTMENT_OTHER): Payer: Self-pay | Admitting: Urology

## 2022-01-28 LAB — CYTOLOGY - NON PAP

## 2022-01-28 LAB — SURGICAL PATHOLOGY

## 2022-02-01 ENCOUNTER — Telehealth: Payer: Self-pay | Admitting: Pharmacist

## 2022-02-01 NOTE — Chronic Care Management (AMB) (Unsigned)
Chronic Care Management Pharmacy Assistant   Name: Maria Marquez  MRN: [id number] DOB: Dec 25, 1945  Reason for Encounter: Disease State / Hypertension Assessment Call   Conditions to be addressed/monitored: HTN  Recent office visits:  None  Recent consult visits:  12/10/2021 [name] - Patient was seen for Difficulty in walking, not elsewhere classified and additional issues. No medication changes. Follow up plan 2 times weekly for 8 weeks.   12/03/2021 [name] - Patient was seen for Difficulty in walking, not elsewhere classified and additional issues. No medication changes. Follow up plan 2 times weekly for 8 weeks.  11/26/2021 Shelby Dubin Menke PT -  Patient was seen for Difficulty in walking, not elsewhere classified and additional issues. No medication changes. Follow up plan 2 times weekly for 8 weeks.  11/24/2021 Shelby Dubin Menke PT -  Patient was seen for Difficulty in walking, not elsewhere classified and additional issues. No medication changes. Follow up plan 2 times weekly for 8 weeks.  11/19/2021 [name] - Patient was seen for S/P right THA. No medication changes. No follow up noted.   11/17/2021 Shelby Dubin Menke PT -  Patient was seen for Difficulty in walking, not elsewhere classified and additional issues. No medication changes. Follow up plan 2 times weekly for 8 weeks.  Hospital visits: Admitted to [facility] on 01/27/2022 (5 hrs) due to cytoscopy with retrograde pyelogram, ureteroscopy, possible biopsy, possible ureteral stent placement.    New?Medications Started at [facility] Discharge:?? ciprofloxacin (Cipro) oxybutynin (DITROPAN) oxyCODONE-acetaminophen (Percocet) phenazopyridine (Pyridium) Medication Changes at Hospital Discharge: No medication changes.  Medications Discontinued at Hospital Discharge: vitamin B-12 1000 MCG tablet (CYANOCOBALAMIN) Medications that remain the same after Hospital Discharge:??  -All other  medications will remain the same.    Medications: Outpatient Encounter Medications as of 02/01/2022  Medication Sig   albuterol (VENTOLIN HFA) 108 (90 Base) MCG/ACT inhaler Inhale 1-2 puffs into the lungs every 6 (six) hours as needed for wheezing or shortness of breath.   atorvastatin (LIPITOR) 20 MG tablet TAKE 1 TABLET (20 MG TOTAL) BY MOUTH DAILY. (Patient taking differently: Take 20 mg by mouth at bedtime.)   calcium carbonate (TUMS - DOSED IN MG ELEMENTAL CALCIUM) 500 MG chewable tablet Chew 500 mg by mouth daily as needed for indigestion or heartburn.   EPINEPHRINE 0.3 mg/0.3 mL IJ SOAJ injection Inject 0.3 mg into the muscle as needed for anaphylaxis   estradiol-norethindrone (ACTIVELLA) 1-0.5 MG tablet Take 1 tablet by mouth daily. (Patient taking differently: Take 1 tablet by mouth every other day. Takes in evening)   ezetimibe (ZETIA) 10 MG tablet TAKE 1 TABLET EVERY DAY (Patient taking differently: Take 10 mg by mouth at bedtime.)   levothyroxine (SYNTHROID) 150 MCG tablet Take 1 tablet (150 mcg total) by mouth daily.   lidocaine-prilocaine (EMLA) cream Apply 1 application. topically as needed.   loratadine (CLARITIN) 10 MG tablet Take 10 mg by mouth daily as needed for allergies.   losartan-hydrochlorothiazide (HYZAAR) 50-12.5 MG tablet TAKE 1 TABLET EVERY DAY (Patient taking differently: Take 1 tablet by mouth daily.)   oxybutynin (DITROPAN) 5 MG tablet Take 1 tablet (5 mg total) by mouth every 8 (eight) hours as needed for bladder spasms.   oxyCODONE-acetaminophen (PERCOCET) 5-325 MG tablet Take 1 tablet by mouth every 4 (four) hours as needed for severe pain.   phenazopyridine (PYRIDIUM) 200 MG tablet Take 1 tablet (200 mg total) by mouth 3 (three) times daily as needed (for pain with urination).  XIIDRA 5 % SOLN Place 1 drop into both eyes 2 (two) times daily as needed (dry eyes).   No facility-administered encounter medications on file as of 02/01/2022.  Fill History: albuterol  sulfate HFA 90 mcg/actuation aerosol inhaler 05/06/2021 25   atorvastatin 20 mg tablet 01/21/2022 90   CHLORHEXIDINE 0.12% RINSE 12/28/2021 15   estradiol-norethindrone acet 1 mg-0.5 mg tablet 12/29/2021 28   ezetimibe 10 mg tablet 08/28/2021 90   levothyroxine 150 mcg tablet 12/12/2021 90   lidocaine-prilocaine 2.5 %-2.5 % topical cream 12/30/2021 30   Xiidra 5 % eye drops in a dropperette 01/12/2022 90   losartan 50 mg-hydrochlorothiazide 12.5 mg tablet 01/21/2022 90   OXYBUTYNIN 5 MG TABLET 01/27/2022 10   PHENAZOPYRIDINE 200 MG TAB 01/27/2022 10   Reviewed chart prior to disease state call. Spoke with patient regarding BP  Recent Office Vitals: BP Readings from Last 3 Encounters:  01/27/22 (!) 137/56  10/26/21 128/83  10/13/21 (!) 141/68   Pulse Readings from Last 3 Encounters:  01/27/22 72  10/26/21 83  10/13/21 85    Wt Readings from Last 3 Encounters:  01/27/22 276 lb 8 oz (125.4 kg)  10/26/21 274 lb 6 oz (124.5 kg)  10/13/21 273 lb 3.2 oz (123.9 kg)     Kidney Function Lab Results  Component Value Date/Time   CREATININE 1.50 (H) 01/27/2022 07:10 AM   CREATININE 1.45 (H) 10/06/2021 10:15 AM   CREATININE 1.52 (H) 09/28/2021 04:30 PM   CREATININE 1.35 (H) 07/03/2021 12:06 PM   GFR 33.78 (L) 09/28/2021 04:30 PM   GFRNONAA 38 (L) 10/06/2021 10:15 AM   GFRAA 51 (L) 04/15/2020 03:01 PM       Latest Ref Rng & Units 01/27/2022    7:10 AM 10/06/2021   10:15 AM 09/28/2021    4:30 PM  BMP  Glucose 70 - 99 mg/dL 125  124  139   BUN 8 - 23 mg/dL 27  31  25    Creatinine 0.44 - 1.00 mg/dL 1.50  1.45  1.52   Sodium 135 - 145 mmol/L 139  135  137   Potassium 3.5 - 5.1 mmol/L 3.7  3.8  4.0   Chloride 98 - 111 mmol/L 101  100  100   CO2 22 - 32 mmol/L  25  31   Calcium 8.9 - 10.3 mg/dL  9.2  9.2     Current antihypertensive regimen:  Losartan HCTZ 50/12.5 mg daily  How often are you checking your Blood Pressure? {CHL HP BP Monitoring  Frequency:[phone]}  Current home BP readings: ***  What recent interventions/DTPs have been made by any provider to improve Blood Pressure control since last CPP Visit: No recent interventions  Any recent hospitalizations or ED visits since last visit with CPP? [facility] on 01/27/2022 (5 hrs) due to cytoscopy with retrograde pyelogram, ureteroscopy, possible biopsy, possible ureteral stent placement.  What diet changes have been made to improve Blood Pressure Control?  Patient follows Breakfast - patient will have Lunch - patient will have Dinner - patient will have  What exercise is being done to improve your Blood Pressure Control?  ***  Adherence Review: Is the patient currently on ACE/ARB medication? Yes Does the patient have >5 day gap between last estimated fill dates? No  SCHEDULE FOLLOW UP AND SEND MADDIE REFERRAL MSG so referral can attach Care Gaps:   Star Rating Drugs: Atorvastatin 20 mg - last filled 01/21/2022 90 DS at [facility] Losartan HCTZ 50/12.5 mg -  last filled 01/21/2022 90 DS at [place]***

## 2022-02-03 DIAGNOSIS — C670 Malignant neoplasm of trigone of bladder: Secondary | ICD-10-CM | POA: Diagnosis not present

## 2022-02-03 DIAGNOSIS — N390 Urinary tract infection, site not specified: Secondary | ICD-10-CM | POA: Diagnosis not present

## 2022-02-03 DIAGNOSIS — C651 Malignant neoplasm of right renal pelvis: Secondary | ICD-10-CM | POA: Diagnosis not present

## 2022-02-04 ENCOUNTER — Encounter: Payer: Self-pay | Admitting: *Deleted

## 2022-02-04 ENCOUNTER — Ambulatory Visit (INDEPENDENT_AMBULATORY_CARE_PROVIDER_SITE_OTHER): Payer: Medicare HMO | Admitting: Internal Medicine

## 2022-02-04 ENCOUNTER — Telehealth: Payer: Self-pay | Admitting: *Deleted

## 2022-02-04 ENCOUNTER — Encounter: Payer: Self-pay | Admitting: Internal Medicine

## 2022-02-04 VITALS — BP 120/60 | HR 70 | Temp 97.8°F | Wt 275.0 lb

## 2022-02-04 DIAGNOSIS — K219 Gastro-esophageal reflux disease without esophagitis: Secondary | ICD-10-CM

## 2022-02-04 DIAGNOSIS — R413 Other amnesia: Secondary | ICD-10-CM

## 2022-02-04 MED ORDER — PANTOPRAZOLE SODIUM 40 MG PO TBEC: 40.0000 mg | DELAYED_RELEASE_TABLET | Freq: Every day | ORAL | 1 refills | Status: DC

## 2022-02-04 NOTE — Progress Notes (Signed)
Established Patient Office Visit     CC/Reason for Visit: Heartburn symptoms  HPI: Maria Marquez is a 76 y.o. female who is coming in today for the above mentioned reasons.  For the past few months she has been having worsening heartburn despite over-the-counter therapy.  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  She has been having some memory issues that are concerning to her.  These have been played off as "chemo brain" in the past.  She will sometimes forget people's names that are well-known to her, most concerning is an episode where after pulling into her garage and turning off the ignition she could not remember how to unlock her car doors to get out.  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[facility])    urologist-- dr winter/  oncologist-- dr Alen Blew;   dx 08/ 2021 ;  chemo 10/ 2021  to 12/ 2021   Chronic kidney disease, stage 3a ([place]) 12/31/2019   Dyspnea on exertion    01-22-2021   per pt no issue most of the time   Family history of breast cancer 05/04/2021   Family history of ovarian cancer 05/04/2021   GERD (gastroesophageal reflux disease)    per pt occasionally takes tums   History of basal cell carcinoma (BCC) excision    per pt in 1970s on nose   History of chemotherapy    06-13-2020  to 12/ 2021  for right renal pelvis carcinoma   History of colon polyps    History of radiation therapy    11-13-2020 to 11-26-2020---   Right Lung- SBRT- Dr. Gery Pray   HLD (hyperlipidemia)    Hypertension    followed by pcp   Hypothyroidism, postsurgical 1979   followed by pcp   Leukocytosis    Nocturia    OA (osteoarthritis)     PONV (postoperative nausea and vomiting)    Since chemo (always nauseousno ponv with surgeries   Pre-diabetes    Primary adenocarcinoma of upper lobe of right lung [facility])    oncologist--- shadad/  pulmonology-- dr byrum;  dx 02/ 2022;  s/p  SBRT 11-13-2020 to 11-26-2020   Urgency of urination    wears pads   Wears partial dentures    lower    Past Surgical History:  Procedure Laterality Date   COLONOSCOPY  last one 2017   CYSTOSCOPY W/ URETERAL STENT PLACEMENT Right 01/27/2022   Procedure: CYSTOSCOPY WITH RETROGRADE PYELOGRAM/ URETEROSCOPY/POSSIBLE BIOPSY/ POSSIBLE  URETERAL STENT PLACEMENT;  Surgeon: Ceasar Mons, MD;  [facility];  Service: Urology;  Laterality: Right;   CYSTOSCOPY WITH BIOPSY Right 09/16/2021   Procedure: CYSTOSCOPY / URETEROSCOPY WITH BIOPSY/bugbee fulgeration/right retrograde;  Surgeon: Ceasar Mons, MD;  Location: [facility];  Service: Urology;  Laterality: Right;   CYSTOSCOPY WITH URETEROSCOPY Right 04/18/2020   Procedure: CYSTOSCOPY WITH URETEROSCOPY, BIOPSY;  Surgeon: Ceasar Mons, MD;  Location: [facility];  Service: Urology;  Laterality: Right;  ONLY NEEDS 45 MIN   CYSTOSCOPY WITH URETEROSCOPY AND STENT PLACEMENT N/A 12/31/2020   Procedure: CYSTOSCOPY WITH RIGHT URETEROSCOPY/ BILATERAL RETROGRADE PYELOGRAM/RIGHT URETERAL  BIOPSY/ LASER ABLATION/RIGHT STENT PLACEMENT;  Surgeon: Ceasar Mons, MD;  Location: [facility];  Service: Urology;  Laterality: N/A;   CYSTOSCOPY/RETROGRADE/URETEROSCOPY Right 04/22/2021   Procedure: CYSTOSCOPY/RETROGRADE/URETEROSCOPY/ STENT PLACEMENT;  Surgeon: Ceasar Mons, MD;  Location: [facility];  Service: Urology;  Laterality: Right;   EYE SURGERY Bilateral 1987   tear ducts   IR IMAGING GUIDED PORT INSERTION  06/04/2020   THYROID LOBECTOMY Right 1979   TOTAL HIP ARTHROPLASTY Right 01/28/2016   Procedure: RIGHT  TOTAL HIP ARTHROPLASTY ANTERIOR APPROACH;  Surgeon: [name], MD;  Location: [facility];  Service: Orthopedics;  Laterality: Right;   UMBILICAL HERNIA REPAIR  1980s   VIDEO BRONCHOSCOPY WITH ENDOBRONCHIAL NAVIGATION N/A 06/06/2020   Procedure: VIDEO BRONCHOSCOPY WITH ENDOBRONCHIAL NAVIGATION;  Surgeon: Collene Gobble, MD;  Location: [place];  Service: Thoracic;  Laterality: N/A;   VIDEO BRONCHOSCOPY WITH ENDOBRONCHIAL NAVIGATION N/A 10/10/2020   Procedure: VIDEO BRONCHOSCOPY WITH ENDOBRONCHIAL NAVIGATION;  Surgeon: [name], MD;  Location: [place];  Service: Thoracic;  Laterality: N/A;    Social History:  reports that she quit smoking about 21 months ago. Her smoking use included cigarettes. She has a 45.00 pack-year smoking history. She has never used smokeless tobacco. She reports that she does not currently use alcohol. She reports that she does not use drugs.  Allergies: Allergies  Allergen Reactions   Iodine Shortness Of Breath   Keflex [Cephalexin] Shortness Of Breath, Rash and Tinitus   Shellfish Allergy Hives   Shellfish-Derived Products Anaphylaxis and Hives   Lasix [Furosemide] Itching   Rosuvastatin Other (See Comments)    Muscle Pain in Thighs   Latex Rash   Lisinopril Cough   Sulfa Antibiotics Nausea Only   Tramadol Itching    Family History:   Family History  Problem Relation Age of Onset   Arthritis Mother    Breast cancer Mother 36   Schizophrenia Mother    Diabetes Father    Heart disease Father    Kidney disease Father    Stroke Sister    Lung cancer Brother        dx > 60; work-related exposures   Heart disease Brother    Heart disease Brother    Vision loss Brother        Glaucoma   Alcohol abuse Brother    Lung cancer Maternal Aunt        dx > 43   Colon cancer Paternal Aunt        dx > 17   Head & neck cancer Paternal Uncle        dx 84s   Ovarian cancer Daughter 94   Breast cancer Niece 7   Leukemia Cousin        d. 72s      Current Outpatient Medications:    albuterol (VENTOLIN HFA) 108 (90 Base) MCG/ACT inhaler, Inhale 1-2 puffs into the lungs every 6 (six) hours as needed for wheezing or shortness of breath., Disp: 18 g, Rfl: 2   atorvastatin (LIPITOR) 20 MG tablet, TAKE 1 TABLET (20 MG TOTAL) BY MOUTH DAILY. (Patient taking differently: Take 20 mg by mouth at bedtime.), Disp: 90 tablet, Rfl: 1   EPINEPHRINE 0.3 mg/0.3 mL IJ SOAJ injection, Inject 0.3 mg into the muscle as needed for anaphylaxis, Disp: 2 each, Rfl: 0   estradiol-norethindrone (ACTIVELLA) 1-0.5 MG tablet, Take 1 tablet by mouth daily. (Patient taking differently: Take 1 tablet by mouth every other day. Takes in  evening), Disp: 28 tablet, Rfl: 5   ezetimibe (ZETIA) 10 MG tablet, TAKE 1 TABLET EVERY DAY (Patient taking differently: Take 10 mg by mouth at bedtime.), Disp: 90 tablet, Rfl: 1   levothyroxine (SYNTHROID) 150 MCG tablet, Take 1 tablet (150 mcg total) by mouth daily., Disp: 90 tablet, Rfl: 1   lidocaine-prilocaine (EMLA) cream, Apply 1 application. topically as needed., Disp: 30 g, Rfl: 0   losartan-hydrochlorothiazide (HYZAAR) 50-12.5 MG tablet, TAKE 1 TABLET EVERY DAY (Patient taking differently: Take 1 tablet by mouth daily.), Disp: 90 tablet, Rfl: 3   oxybutynin (DITROPAN) 5 MG tablet, Take 1 tablet (5 mg total) by mouth every 8 (eight) hours as needed for bladder spasms., Disp: 30 tablet, Rfl: 1   oxyCODONE-acetaminophen (PERCOCET) 5-325 MG tablet, Take 1 tablet by mouth every 4 (four) hours as needed for severe pain., Disp: 20 tablet, Rfl: 0   pantoprazole (PROTONIX) 40 MG tablet, Take 1 tablet (40 mg total) by mouth daily., Disp: 90 tablet, Rfl: 1   phenazopyridine (PYRIDIUM) 200 MG tablet, Take 1 tablet (200 mg total) by mouth 3 (three) times daily as needed (for pain with urination)., Disp: 30 tablet, Rfl: 0   XIIDRA 5 % SOLN, Place 1 drop into both eyes 2 (two) times daily as needed (dry eyes)., Disp: , Rfl:   Review of  Systems:  Constitutional: Denies fever, chills, diaphoresis, appetite change and fatigue.  HEENT: Denies photophobia, eye pain, redness, hearing loss, ear pain, congestion, sore throat, rhinorrhea, sneezing, mouth sores, trouble swallowing, neck pain, neck stiffness and tinnitus.   Respiratory: Denies SOB, DOE, cough, chest tightness,  and wheezing.   Cardiovascular: Denies chest pain, palpitations and leg swelling.  Gastrointestinal: Denies  diarrhea, constipation, blood in stool and abdominal distention.  Genitourinary: Denies dysuria, urgency, frequency,  flank pain and difficulty urinating.  Endocrine: Denies: hot or cold intolerance, sweats, changes in hair or nails, polyuria, polydipsia. Musculoskeletal: Denies myalgias, back pain, joint swelling, arthralgias and gait problem.  Skin: Denies pallor, rash and wound.  Neurological: Denies dizziness, seizures, syncope, weakness, light-headedness, numbness and headaches.  Hematological: Denies adenopathy. Easy bruising, personal or family bleeding history  Psychiatric/Behavioral: Denies suicidal ideation, mood changes, confusion, nervousness, sleep disturbance and agitation    Physical Exam: Vitals:   02/04/22 1127  BP: 120/60  Pulse: 70  Temp: 97.8 F (36.6 C)  TempSrc: Oral  SpO2: 99%  Weight: 275 lb (124.7 kg)    Body mass index is 44.39 kg/m.   Constitutional: NAD, calm, comfortable Eyes: PERRL, lids and conjunctivae normal ENMT: Mucous membranes are moist.  Respiratory: clear to auscultation bilaterally, no wheezing, no crackles. Normal respiratory effort. No accessory muscle use.  Cardiovascular: Regular rate and rhythm, no murmurs / rubs / gallops. No extremity edema.  Psychiatric: Normal judgment and insight. Alert and oriented x 3. Normal mood.    Impression and Plan:  Gastroesophageal reflux disease, unspecified whether esophagitis present  - Plan: pantoprazole (PROTONIX) 40 MG tablet -If no improvement after 8  to 12 weeks can consider GI referral.  Memory loss -Will ask patient to discuss with her oncologist whether brain imaging might be mandated, this is something that I would strongly support. -Next office visit will do MMSE.    Time spent:31 minutes reviewing chart, interviewing and examining patient and formulating plan of care.    [name], MD [facility]

## 2022-02-04 NOTE — Telephone Encounter (Signed)
Notified that Dr Alen Blew can see her 6/26 @ 0930.

## 2022-02-04 NOTE — Telephone Encounter (Signed)
Maria Marquez left a message stating Dr Lovena Neighbours did as cystoscopy and biopsy. She has cancer in her kidney and bladder. Wants to know if she needs to go ahead and get an appt with Dr Alen Blew.

## 2022-02-05 ENCOUNTER — Other Ambulatory Visit: Payer: Self-pay | Admitting: Internal Medicine

## 2022-02-05 DIAGNOSIS — Z79890 Hormone replacement therapy (postmenopausal): Secondary | ICD-10-CM

## 2022-02-09 ENCOUNTER — Inpatient Hospital Stay: Payer: Medicare HMO | Attending: Oncology

## 2022-02-09 ENCOUNTER — Other Ambulatory Visit: Payer: Self-pay

## 2022-02-09 DIAGNOSIS — Z85118 Personal history of other malignant neoplasm of bronchus and lung: Secondary | ICD-10-CM | POA: Diagnosis not present

## 2022-02-09 DIAGNOSIS — Z8553 Personal history of malignant neoplasm of renal pelvis: Secondary | ICD-10-CM | POA: Diagnosis not present

## 2022-02-09 DIAGNOSIS — Z452 Encounter for adjustment and management of vascular access device: Secondary | ICD-10-CM | POA: Diagnosis not present

## 2022-02-09 DIAGNOSIS — Z95828 Presence of other vascular implants and grafts: Secondary | ICD-10-CM

## 2022-02-09 MED ORDER — HEPARIN SOD (PORK) LOCK FLUSH 100 UNIT/ML IV SOLN
500.0000 [IU] | Freq: Once | INTRAVENOUS | Status: AC
  Administered 2022-02-09: 500 [IU]

## 2022-02-09 MED ORDER — SODIUM CHLORIDE 0.9% FLUSH
10.0000 mL | Freq: Once | INTRAVENOUS | Status: AC
  Administered 2022-02-09: 10 mL

## 2022-02-10 ENCOUNTER — Other Ambulatory Visit (HOSPITAL_COMMUNITY): Payer: Self-pay | Admitting: Urology

## 2022-02-10 ENCOUNTER — Other Ambulatory Visit (HOSPITAL_COMMUNITY): Payer: Self-pay | Admitting: Student

## 2022-02-10 DIAGNOSIS — C651 Malignant neoplasm of right renal pelvis: Secondary | ICD-10-CM

## 2022-02-11 ENCOUNTER — Other Ambulatory Visit: Payer: Self-pay | Admitting: Internal Medicine

## 2022-02-12 ENCOUNTER — Other Ambulatory Visit: Payer: Self-pay | Admitting: Internal Medicine

## 2022-02-12 DIAGNOSIS — K219 Gastro-esophageal reflux disease without esophagitis: Secondary | ICD-10-CM

## 2022-02-12 DIAGNOSIS — C3411 Malignant neoplasm of upper lobe, right bronchus or lung: Secondary | ICD-10-CM

## 2022-02-14 ENCOUNTER — Encounter: Payer: Self-pay | Admitting: Internal Medicine

## 2022-02-15 ENCOUNTER — Inpatient Hospital Stay (HOSPITAL_BASED_OUTPATIENT_CLINIC_OR_DEPARTMENT_OTHER): Payer: Medicare HMO | Admitting: Oncology

## 2022-02-15 ENCOUNTER — Telehealth: Payer: Self-pay | Admitting: Internal Medicine

## 2022-02-15 ENCOUNTER — Encounter (HOSPITAL_COMMUNITY): Payer: Self-pay

## 2022-02-15 ENCOUNTER — Other Ambulatory Visit: Payer: Self-pay

## 2022-02-15 ENCOUNTER — Emergency Department (HOSPITAL_BASED_OUTPATIENT_CLINIC_OR_DEPARTMENT_OTHER): Payer: Medicare HMO

## 2022-02-15 ENCOUNTER — Emergency Department (HOSPITAL_COMMUNITY)
Admission: EM | Admit: 2022-02-15 | Discharge: 2022-02-15 | Disposition: A | Discharge status: Home / Self Care | Payer: Medicare HMO | Attending: Emergency Medicine | Admitting: Emergency Medicine

## 2022-02-15 ENCOUNTER — Emergency Department (HOSPITAL_COMMUNITY): Payer: Medicare HMO

## 2022-02-15 VITALS — BP 146/59 | HR 74 | Temp 97.4°F | Resp 16 | Wt 280.6 lb

## 2022-02-15 DIAGNOSIS — R911 Solitary pulmonary nodule: Secondary | ICD-10-CM | POA: Diagnosis not present

## 2022-02-15 DIAGNOSIS — C659 Malignant neoplasm of unspecified renal pelvis: Secondary | ICD-10-CM

## 2022-02-15 DIAGNOSIS — Z91040 Latex allergy status: Secondary | ICD-10-CM | POA: Diagnosis not present

## 2022-02-15 DIAGNOSIS — Z8551 Personal history of malignant neoplasm of bladder: Secondary | ICD-10-CM | POA: Insufficient documentation

## 2022-02-15 DIAGNOSIS — Z20822 Contact with and (suspected) exposure to covid-19: Secondary | ICD-10-CM | POA: Diagnosis not present

## 2022-02-15 DIAGNOSIS — R609 Edema, unspecified: Secondary | ICD-10-CM | POA: Diagnosis not present

## 2022-02-15 DIAGNOSIS — R059 Cough, unspecified: Secondary | ICD-10-CM | POA: Diagnosis not present

## 2022-02-15 DIAGNOSIS — Z95828 Presence of other vascular implants and grafts: Secondary | ICD-10-CM | POA: Diagnosis not present

## 2022-02-15 DIAGNOSIS — I10 Essential (primary) hypertension: Secondary | ICD-10-CM | POA: Insufficient documentation

## 2022-02-15 DIAGNOSIS — R197 Diarrhea, unspecified: Secondary | ICD-10-CM | POA: Diagnosis not present

## 2022-02-15 DIAGNOSIS — R600 Localized edema: Secondary | ICD-10-CM | POA: Diagnosis not present

## 2022-02-15 DIAGNOSIS — Z8554 Personal history of malignant neoplasm of ureter: Secondary | ICD-10-CM | POA: Insufficient documentation

## 2022-02-15 DIAGNOSIS — R0602 Shortness of breath: Secondary | ICD-10-CM | POA: Insufficient documentation

## 2022-02-15 DIAGNOSIS — Z79899 Other long term (current) drug therapy: Secondary | ICD-10-CM | POA: Diagnosis not present

## 2022-02-15 DIAGNOSIS — Z85118 Personal history of other malignant neoplasm of bronchus and lung: Secondary | ICD-10-CM | POA: Insufficient documentation

## 2022-02-15 DIAGNOSIS — R040 Epistaxis: Secondary | ICD-10-CM | POA: Diagnosis not present

## 2022-02-15 DIAGNOSIS — R519 Headache, unspecified: Secondary | ICD-10-CM | POA: Insufficient documentation

## 2022-02-15 LAB — CBC WITH DIFFERENTIAL/PLATELET
Abs Immature Granulocytes: 0.01 10*3/uL (ref 0.00–0.07)
Basophils Absolute: 0 10*3/uL (ref 0.0–0.1)
Basophils Relative: 0 %
Eosinophils Absolute: 0.3 10*3/uL (ref 0.0–0.5)
Eosinophils Relative: 4 %
HCT: 38.1 % (ref 36.0–46.0)
Hemoglobin: 12.4 g/dL (ref 12.0–15.0)
Immature Granulocytes: 0 %
Lymphocytes Relative: 18 %
Lymphs Abs: 1.3 10*3/uL (ref 0.7–4.0)
MCH: 30 pg (ref 26.0–34.0)
MCHC: 32.5 g/dL (ref 30.0–36.0)
MCV: 92.3 fL (ref 80.0–100.0)
Monocytes Absolute: 0.6 10*3/uL (ref 0.1–1.0)
Monocytes Relative: 8 %
Neutro Abs: 4.8 10*3/uL (ref 1.7–7.7)
Neutrophils Relative %: 70 %
Platelets: 225 10*3/uL (ref 150–400)
RBC: 4.13 MIL/uL (ref 3.87–5.11)
RDW: 13.9 % (ref 11.5–15.5)
WBC: 7 10*3/uL (ref 4.0–10.5)
nRBC: 0 % (ref 0.0–0.2)

## 2022-02-15 LAB — TROPONIN I (HIGH SENSITIVITY)
Troponin I (High Sensitivity): 4 ng/L (ref <18)
Troponin I (High Sensitivity): 4 ng/L (ref <18)

## 2022-02-15 LAB — COMPREHENSIVE METABOLIC PANEL
ALT: 17 U/L (ref 0–44)
AST: 17 U/L (ref 15–41)
Albumin: 3.6 g/dL (ref 3.5–5.0)
Alkaline Phosphatase: 84 U/L (ref 38–126)
Anion gap: 7 (ref 5–15)
BUN: 26 mg/dL — ABNORMAL HIGH (ref 8–23)
CO2: 26 mmol/L (ref 22–32)
Calcium: 8.9 mg/dL (ref 8.9–10.3)
Chloride: 104 mmol/L (ref 98–111)
Creatinine, Ser: 1.49 mg/dL — ABNORMAL HIGH (ref 0.44–1.00)
GFR, Estimated: 37 mL/min — ABNORMAL LOW (ref >60)
Glucose, Bld: 133 mg/dL — ABNORMAL HIGH (ref 70–99)
Potassium: 4.1 mmol/L (ref 3.5–5.1)
Sodium: 137 mmol/L (ref 135–145)
Total Bilirubin: 0.8 mg/dL (ref 0.3–1.2)
Total Protein: 6.8 g/dL (ref 6.5–8.1)

## 2022-02-15 LAB — BRAIN NATRIURETIC PEPTIDE: B Natriuretic Peptide: 65.4 pg/mL (ref 0.0–100.0)

## 2022-02-15 LAB — SARS CORONAVIRUS 2 BY RT PCR: SARS Coronavirus 2 by RT PCR: NEGATIVE

## 2022-02-15 MED ORDER — DIPHENHYDRAMINE HCL 50 MG PO TABS: 50.0000 mg | ORAL_TABLET | Freq: Every evening | ORAL | 0 refills | Status: DC | PRN

## 2022-02-15 MED ORDER — PREDNISONE 50 MG PO TABS: ORAL_TABLET | ORAL | 0 refills | Status: DC

## 2022-02-15 MED ORDER — ALBUTEROL SULFATE HFA 108 (90 BASE) MCG/ACT IN AERS: 2.0000 | INHALATION_SPRAY | RESPIRATORY_TRACT | Status: DC | PRN

## 2022-02-15 MED ORDER — AMILORIDE HCL 5 MG PO TABS: 5.0000 mg | ORAL_TABLET | Freq: Every day | ORAL | 0 refills | Status: DC

## 2022-02-15 MED ORDER — EZETIMIBE 10 MG PO TABS: 10.0000 mg | ORAL_TABLET | Freq: Every day | ORAL | 1 refills | Status: DC

## 2022-02-15 NOTE — Progress Notes (Signed)
Called Maria Marquez and notified that her pre-procedure medications have been sent to [facility] [address] for her July 12 th procedure.  Maria Marquez verbalized understanding of how to take medications to prepare for procedure.     Alex Gardener, AGNP-BC 02/15/2022, 2:22 PM

## 2022-02-16 ENCOUNTER — Ambulatory Visit (INDEPENDENT_AMBULATORY_CARE_PROVIDER_SITE_OTHER): Payer: Medicare HMO | Admitting: Family Medicine

## 2022-02-16 ENCOUNTER — Encounter: Payer: Self-pay | Admitting: Family Medicine

## 2022-02-16 VITALS — BP 120/70 | HR 77 | Resp 16 | Ht 66.0 in | Wt 280.0 lb

## 2022-02-16 DIAGNOSIS — R197 Diarrhea, unspecified: Secondary | ICD-10-CM

## 2022-02-16 DIAGNOSIS — K219 Gastro-esophageal reflux disease without esophagitis: Secondary | ICD-10-CM

## 2022-02-16 DIAGNOSIS — R600 Localized edema: Secondary | ICD-10-CM

## 2022-02-16 DIAGNOSIS — N1831 Chronic kidney disease, stage 3a: Secondary | ICD-10-CM

## 2022-02-16 DIAGNOSIS — I10 Essential (primary) hypertension: Secondary | ICD-10-CM

## 2022-02-16 MED ORDER — FAMOTIDINE 20 MG PO TABS: 20.0000 mg | ORAL_TABLET | Freq: Two times a day (BID) | ORAL | 2 refills | Status: DC

## 2022-02-17 ENCOUNTER — Other Ambulatory Visit (HOSPITAL_COMMUNITY): Payer: Medicare HMO

## 2022-02-17 DIAGNOSIS — I700 Atherosclerosis of aorta: Secondary | ICD-10-CM | POA: Diagnosis not present

## 2022-02-17 DIAGNOSIS — Z85118 Personal history of other malignant neoplasm of bronchus and lung: Secondary | ICD-10-CM | POA: Diagnosis not present

## 2022-02-17 DIAGNOSIS — C651 Malignant neoplasm of right renal pelvis: Secondary | ICD-10-CM | POA: Diagnosis not present

## 2022-02-17 DIAGNOSIS — C670 Malignant neoplasm of trigone of bladder: Secondary | ICD-10-CM | POA: Diagnosis not present

## 2022-02-17 DIAGNOSIS — K429 Umbilical hernia without obstruction or gangrene: Secondary | ICD-10-CM | POA: Diagnosis not present

## 2022-02-17 DIAGNOSIS — K449 Diaphragmatic hernia without obstruction or gangrene: Secondary | ICD-10-CM | POA: Diagnosis not present

## 2022-02-17 DIAGNOSIS — K8020 Calculus of gallbladder without cholecystitis without obstruction: Secondary | ICD-10-CM | POA: Diagnosis not present

## 2022-02-19 ENCOUNTER — Encounter: Payer: Self-pay | Admitting: Oncology

## 2022-02-24 ENCOUNTER — Other Ambulatory Visit: Payer: Self-pay | Admitting: Internal Medicine

## 2022-02-24 ENCOUNTER — Other Ambulatory Visit (INDEPENDENT_AMBULATORY_CARE_PROVIDER_SITE_OTHER): Payer: Medicare HMO

## 2022-02-24 DIAGNOSIS — I10 Essential (primary) hypertension: Secondary | ICD-10-CM

## 2022-02-24 DIAGNOSIS — R600 Localized edema: Secondary | ICD-10-CM

## 2022-02-24 LAB — BASIC METABOLIC PANEL
BUN: 23 mg/dL (ref 6–23)
CO2: 25 mEq/L (ref 19–32)
Calcium: 9.1 mg/dL (ref 8.4–10.5)
Chloride: 103 mEq/L (ref 96–112)
Creatinine, Ser: 1.54 mg/dL — ABNORMAL HIGH (ref 0.40–1.20)
GFR: 33.16 mL/min — ABNORMAL LOW (ref >60.00)
Glucose, Bld: 133 mg/dL — ABNORMAL HIGH (ref 70–99)
Potassium: 4.1 mEq/L (ref 3.5–5.1)
Sodium: 137 mEq/L (ref 135–145)

## 2022-03-02 ENCOUNTER — Ambulatory Visit (INDEPENDENT_AMBULATORY_CARE_PROVIDER_SITE_OTHER): Payer: Medicare HMO | Admitting: Internal Medicine

## 2022-03-02 ENCOUNTER — Encounter: Payer: Self-pay | Admitting: Internal Medicine

## 2022-03-02 ENCOUNTER — Telehealth: Payer: Self-pay | Admitting: Pharmacist

## 2022-03-02 ENCOUNTER — Other Ambulatory Visit: Payer: Self-pay | Admitting: Radiology

## 2022-03-02 VITALS — BP 110/70 | HR 73 | Temp 97.7°F | Wt 280.5 lb

## 2022-03-02 DIAGNOSIS — R600 Localized edema: Secondary | ICD-10-CM | POA: Diagnosis not present

## 2022-03-02 DIAGNOSIS — C659 Malignant neoplasm of unspecified renal pelvis: Secondary | ICD-10-CM

## 2022-03-02 DIAGNOSIS — N1831 Chronic kidney disease, stage 3a: Secondary | ICD-10-CM

## 2022-03-02 DIAGNOSIS — I872 Venous insufficiency (chronic) (peripheral): Secondary | ICD-10-CM | POA: Diagnosis not present

## 2022-03-02 DIAGNOSIS — C689 Malignant neoplasm of urinary organ, unspecified: Secondary | ICD-10-CM

## 2022-03-02 MED ORDER — FUROSEMIDE 20 MG PO TABS: 20.0000 mg | ORAL_TABLET | Freq: Every day | ORAL | 3 refills | Status: DC | PRN

## 2022-03-02 NOTE — Progress Notes (Signed)
Established Patient Office Visit     CC/Reason for Visit: Follow-up leg edema  HPI: Maria Marquez is a 76 y.o. female who is coming in today for the above mentioned reasons.  She was seen in the emergency department end of June for lower extremity edema and then seen by one of my partners in follow-up.  She had Dopplers that were negative for DVT.  She has not had an echocardiogram.  She was prescribed amiloride.  She states that the edema has improved a little but she is no longer taking the amiloride.  She also feels a little depressed but is hesitant to start medication.  She will be admitted to the hospital tomorrow to have renal port placed for chemotherapy.  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[facility])    urologist-- dr winter/  oncologist-- dr Alen Blew;   dx 08/ 2021 ;  chemo 10/ 2021  to 12/ 2021   Chronic kidney disease, stage 3a ([place]) 12/31/2019   Dyspnea on exertion    01-22-2021   per pt no issue most of the time   Family history of breast cancer 05/04/2021   Family history of ovarian cancer 05/04/2021   GERD (gastroesophageal reflux disease)    per pt occasionally takes tums   History of basal cell carcinoma (BCC) excision    per pt in 1970s on nose   History of chemotherapy    06-13-2020  to 12/ 2021  for right renal pelvis carcinoma   History of colon polyps    History of radiation therapy    11-13-2020 to 11-26-2020---   Right Lung- SBRT- Dr. Gery Pray   HLD (hyperlipidemia)    Hypertension    followed by pcp   Hypothyroidism, postsurgical 1979   followed by pcp   Leukocytosis    Nocturia    OA (osteoarthritis)    PONV (postoperative nausea and vomiting)    Since chemo (always nauseousno ponv with surgeries   Pre-diabetes    Primary adenocarcinoma of upper  lobe of right lung [facility])    oncologist--- shadad/  pulmonology-- dr byrum;  dx 02/ 2022;  s/p  SBRT 11-13-2020 to 11-26-2020   Urgency of urination    wears pads   Wears partial dentures    lower    Past Surgical History:  Procedure Laterality Date   COLONOSCOPY  last one 2017   CYSTOSCOPY W/ URETERAL STENT PLACEMENT Right 01/27/2022   Procedure: CYSTOSCOPY WITH RETROGRADE PYELOGRAM/ URETEROSCOPY/POSSIBLE BIOPSY/ POSSIBLE  URETERAL STENT PLACEMENT;  Surgeon: Ceasar Mons, MD;  [facility];  Service: Urology;  Laterality: Right;   CYSTOSCOPY WITH BIOPSY Right 09/16/2021   Procedure: CYSTOSCOPY / URETEROSCOPY WITH BIOPSY/bugbee fulgeration/right retrograde;  Surgeon: Ceasar Mons, MD;  Location: [facility];  Service: Urology;  Laterality: Right;   CYSTOSCOPY WITH URETEROSCOPY Right 04/18/2020   Procedure: CYSTOSCOPY WITH URETEROSCOPY, BIOPSY;  Surgeon: Ceasar Mons, MD;  [facility];  Service: Urology;  Laterality: Right;  ONLY NEEDS 45 MIN   CYSTOSCOPY WITH URETEROSCOPY AND STENT PLACEMENT N/A 12/31/2020   Procedure: CYSTOSCOPY WITH RIGHT URETEROSCOPY/ BILATERAL RETROGRADE PYELOGRAM/RIGHT URETERAL BIOPSY/ LASER ABLATION/RIGHT STENT PLACEMENT;  Surgeon: Ceasar Mons, MD;  Location: [facility];  Service: Urology;  Laterality:  N/A;   CYSTOSCOPY/RETROGRADE/URETEROSCOPY Right 04/22/2021   Procedure: CYSTOSCOPY/RETROGRADE/URETEROSCOPY/ STENT PLACEMENT;  Surgeon: Ceasar Mons, MD;  Location: [facility];  Service: Urology;  Laterality: Right;   EYE SURGERY Bilateral 1987   tear ducts   IR IMAGING GUIDED PORT INSERTION  06/04/2020   THYROID LOBECTOMY Right 1979   TOTAL HIP ARTHROPLASTY Right 01/28/2016   Procedure: RIGHT TOTAL HIP ARTHROPLASTY ANTERIOR APPROACH;  Surgeon: [name], MD;  [facility];  Service: Orthopedics;  Laterality: Right;   UMBILICAL  HERNIA REPAIR  1980s   VIDEO BRONCHOSCOPY WITH ENDOBRONCHIAL NAVIGATION N/A 06/06/2020   Procedure: VIDEO BRONCHOSCOPY WITH ENDOBRONCHIAL NAVIGATION;  Surgeon: Collene Gobble, MD;  Location: [place];  Service: Thoracic;  Laterality: N/A;   VIDEO BRONCHOSCOPY WITH ENDOBRONCHIAL NAVIGATION N/A 10/10/2020   Procedure: VIDEO BRONCHOSCOPY WITH ENDOBRONCHIAL NAVIGATION;  Surgeon: [name], MD;  Location: [place];  Service: Thoracic;  Laterality: N/A;    Social History:  reports that she quit smoking about 22 months ago. Her smoking use included cigarettes. She has a 45.00 pack-year smoking history. She has never used smokeless tobacco. She reports that she does not currently use alcohol. She reports that she does not use drugs.  Allergies: Allergies  Allergen Reactions   Iodine Shortness Of Breath   Keflex [Cephalexin] Shortness Of Breath, Rash and Tinitus   Shellfish Allergy Hives   Shellfish-Derived Products Anaphylaxis and Hives   Lasix [Furosemide] Itching   Rosuvastatin Other (See Comments)    Muscle Pain in Thighs   Latex Rash   Lisinopril Cough   Sulfa Antibiotics Nausea Only   Tramadol Itching    Family History:  Family History  Problem Relation Age of Onset   Arthritis Mother    Breast cancer Mother 90   Schizophrenia Mother    Diabetes Father    Heart disease Father    Kidney disease Father    Stroke Sister    Lung cancer Brother        dx > 10; work-related exposures   Heart disease Brother    Heart disease Brother    Vision loss Brother        Glaucoma   Alcohol abuse Brother    Lung cancer Maternal Aunt        dx > 59   Colon cancer Paternal Aunt        dx > 65   Head & neck cancer Paternal Uncle        dx 25s   Ovarian cancer Daughter 7   Breast cancer Niece 80   Leukemia Cousin        d. 9s     Current Outpatient Medications:    albuterol (VENTOLIN HFA) 108 (90 Base) MCG/ACT inhaler, INHALE ONE TO TWO PUFFS BY MOUTH INTO THE LUNGS EVERY SIX  HOURS AS NEEDED FOR WHEEZING OR SORTNESS OF BREATH, Disp: 18 g, Rfl: 0   atorvastatin (LIPITOR) 20 MG tablet, TAKE 1 TABLET (20 MG TOTAL) BY MOUTH DAILY. (Patient taking differently: Take 20 mg by mouth at bedtime.), Disp: 90 tablet, Rfl: 1   diphenhydrAMINE (BENADRYL) 50 MG tablet, Take 1 tablet (50 mg total) by mouth at bedtime as needed for itching. Take 1 tablet at 7:30 am the morning of the procedure, Disp: 1 tablet, Rfl: 0   EPINEPHRINE 0.3 mg/0.3 mL IJ SOAJ injection, Inject 0.3 mg into the muscle as needed for anaphylaxis, Disp: 2 each, Rfl: 0   estradiol-norethindrone (ACTIVELLA) 1-0.5 MG tablet, TAKE ONE  TABLET BY MOUTH ONE TIME DAILY, Disp: 28 tablet, Rfl: 0   ezetimibe (ZETIA) 10 MG tablet, Take 1 tablet (10 mg total) by mouth daily., Disp: 90 tablet, Rfl: 1   famotidine (PEPCID) 20 MG tablet, Take 1 tablet (20 mg total) by mouth 2 (two) times daily., Disp: 60 tablet, Rfl: 2   furosemide (LASIX) 20 MG tablet, Take 1 tablet (20 mg total) by mouth daily as needed (leg swelling)., Disp: 30 tablet, Rfl: 3   levothyroxine (SYNTHROID) 150 MCG tablet, TAKE 1 TABLET EVERY DAY, Disp: 90 tablet, Rfl: 1   lidocaine-prilocaine (EMLA) cream, Apply 1 application. topically as needed., Disp: 30 g, Rfl: 0   losartan-hydrochlorothiazide (HYZAAR) 50-12.5 MG tablet, TAKE 1 TABLET EVERY DAY (Patient taking differently: Take 1 tablet by mouth daily.), Disp: 90 tablet, Rfl: 3   oxybutynin (DITROPAN) 5 MG tablet, Take 1 tablet (5 mg total) by mouth every 8 (eight) hours as needed for bladder spasms., Disp: 30 tablet, Rfl: 1   oxyCODONE-acetaminophen (PERCOCET) 5-325 MG tablet, Take 1 tablet by mouth every 4 (four) hours as needed for severe pain., Disp: 20 tablet, Rfl: 0   phenazopyridine (PYRIDIUM) 200 MG tablet, Take 1 tablet (200 mg total) by mouth 3 (three) times daily as needed (for pain with urination)., Disp: 30 tablet, Rfl: 0   predniSONE (DELTASONE) 50 MG tablet, Take 1 tablet at 7:30 pm the night before  procedure. Take 1 tablet 1:30 am the morning before procedure. Take 1 tablet 7:30 am the morning of procedure., Disp: 3 tablet, Rfl: 0   XIIDRA 5 % SOLN, Place 1 drop into both eyes 2 (two) times daily as needed (dry eyes)., Disp: , Rfl:   Review of Systems:  Constitutional: Denies fever, chills, diaphoresis, appetite change and fatigue.  HEENT: Denies photophobia, eye pain, redness, hearing loss, ear pain, congestion, sore throat, rhinorrhea, sneezing, mouth sores, trouble swallowing, neck pain, neck stiffness and tinnitus.   Respiratory: Denies SOB, DOE, cough, chest tightness,  and wheezing.   Cardiovascular: Denies chest pain, palpitations and leg swelling.  Gastrointestinal: Denies nausea, vomiting, abdominal pain, diarrhea, constipation, blood in stool and abdominal distention.  Genitourinary: Denies dysuria, urgency, frequency, hematuria, flank pain and difficulty urinating.  Endocrine: Denies: hot or cold intolerance, sweats, changes in hair or nails, polyuria, polydipsia. Musculoskeletal: Denies myalgias, back pain, joint swelling, arthralgias and gait problem.  Skin: Denies pallor, rash and wound.  Neurological: Denies dizziness, seizures, syncope, weakness, light-headedness, numbness and headaches.  Hematological: Denies adenopathy. Easy bruising, personal or family bleeding history  Psychiatric/Behavioral: Denies suicidal ideation, mood changes, confusion, nervousness, sleep disturbance and agitation    Physical Exam: Vitals:   03/02/22 1257  BP: 110/70  Pulse: 73  Temp: 97.7 F (36.5 C)  TempSrc: Oral  SpO2: 99%  Weight: 280 lb 8 oz (127.2 kg)    Body mass index is 45.27 kg/m.   Constitutional: NAD, calm, comfortable Eyes: PERRL, lids and conjunctivae normal ENMT: Mucous membranes are moist.  Respiratory: clear to auscultation bilaterally, no wheezing, no crackles. Normal respiratory effort. No accessory muscle use.  Cardiovascular: Regular rate and rhythm, no  murmurs / rubs / gallops.  1+ pitting bilateral lower extremity edema. Psychiatric: Normal judgment and insight. Alert and oriented x 3. Normal mood.    Impression and Plan:  Bilateral lower extremity edema - Plan: furosemide (LASIX) 20 MG tablet  Cancer of renal pelvis, unspecified laterality (HCC)  Chronic kidney disease, stage 3a (HCC)  Venous insufficiency of both lower extremities  -I  believe her lower extremity edema is due to a combination of chronic venous insufficiency, low oncotic pressure, as well as her stage III chronic kidney disease.  I do not have any echocardiogram on file to evaluate her ejection fraction or diastolic function. -I will discontinue amiloride and prescribe Lasix 20 mg to take as needed for lower extremity edema.  I have advised that she get fitted for compression stockings and follow with a high-protein diet to increase oncotic pressure. -We have discussed her depression, she does not want to start medication but will observe and we can discuss at next visit.  Time spent:33 minutes reviewing chart, interviewing and examining patient and formulating plan of care.    [name], MD [facility]

## 2022-03-02 NOTE — Chronic Care Management (AMB) (Signed)
    Chronic Care Management Pharmacy Assistant  Done in error

## 2022-03-03 ENCOUNTER — Encounter (HOSPITAL_COMMUNITY): Payer: Self-pay

## 2022-03-03 ENCOUNTER — Ambulatory Visit (HOSPITAL_COMMUNITY)
Admission: RE | Admit: 2022-03-03 | Discharge: 2022-03-03 | Disposition: A | Discharge status: Home / Self Care | Payer: Medicare HMO | Source: Ambulatory Visit | Attending: Urology | Admitting: Urology

## 2022-03-03 ENCOUNTER — Other Ambulatory Visit: Payer: Self-pay

## 2022-03-03 DIAGNOSIS — I129 Hypertensive chronic kidney disease with stage 1 through stage 4 chronic kidney disease, or unspecified chronic kidney disease: Secondary | ICD-10-CM | POA: Insufficient documentation

## 2022-03-03 DIAGNOSIS — Z85118 Personal history of other malignant neoplasm of bronchus and lung: Secondary | ICD-10-CM | POA: Insufficient documentation

## 2022-03-03 DIAGNOSIS — K219 Gastro-esophageal reflux disease without esophagitis: Secondary | ICD-10-CM | POA: Insufficient documentation

## 2022-03-03 DIAGNOSIS — C679 Malignant neoplasm of bladder, unspecified: Secondary | ICD-10-CM | POA: Diagnosis not present

## 2022-03-03 DIAGNOSIS — C651 Malignant neoplasm of right renal pelvis: Secondary | ICD-10-CM

## 2022-03-03 DIAGNOSIS — N1831 Chronic kidney disease, stage 3a: Secondary | ICD-10-CM | POA: Insufficient documentation

## 2022-03-03 DIAGNOSIS — E785 Hyperlipidemia, unspecified: Secondary | ICD-10-CM | POA: Diagnosis not present

## 2022-03-03 DIAGNOSIS — Z91041 Radiographic dye allergy status: Secondary | ICD-10-CM | POA: Insufficient documentation

## 2022-03-03 DIAGNOSIS — C641 Malignant neoplasm of right kidney, except renal pelvis: Secondary | ICD-10-CM | POA: Diagnosis not present

## 2022-03-03 DIAGNOSIS — C689 Malignant neoplasm of urinary organ, unspecified: Secondary | ICD-10-CM

## 2022-03-03 HISTORY — PX: IR NEPHROSTOMY PLACEMENT RIGHT: IMG6064

## 2022-03-03 LAB — CBC WITH DIFFERENTIAL/PLATELET
Abs Immature Granulocytes: 0.05 10*3/uL (ref 0.00–0.07)
Basophils Absolute: 0 10*3/uL (ref 0.0–0.1)
Basophils Relative: 0 %
Eosinophils Absolute: 0 10*3/uL (ref 0.0–0.5)
Eosinophils Relative: 0 %
HCT: 37.9 % (ref 36.0–46.0)
Hemoglobin: 12.5 g/dL (ref 12.0–15.0)
Immature Granulocytes: 1 %
Lymphocytes Relative: 11 %
Lymphs Abs: 0.9 10*3/uL (ref 0.7–4.0)
MCH: 29.9 pg (ref 26.0–34.0)
MCHC: 33 g/dL (ref 30.0–36.0)
MCV: 90.7 fL (ref 80.0–100.0)
Monocytes Absolute: 0.1 10*3/uL (ref 0.1–1.0)
Monocytes Relative: 2 %
Neutro Abs: 7.7 10*3/uL (ref 1.7–7.7)
Neutrophils Relative %: 86 %
Platelets: 227 10*3/uL (ref 150–400)
RBC: 4.18 MIL/uL (ref 3.87–5.11)
RDW: 14 % (ref 11.5–15.5)
WBC: 8.9 10*3/uL (ref 4.0–10.5)
nRBC: 0 % (ref 0.0–0.2)

## 2022-03-03 LAB — PROTIME-INR
INR: 1 (ref 0.8–1.2)
Prothrombin Time: 12.6 seconds (ref 11.4–15.2)

## 2022-03-03 LAB — BASIC METABOLIC PANEL
Anion gap: 9 (ref 5–15)
BUN: 28 mg/dL — ABNORMAL HIGH (ref 8–23)
CO2: 25 mmol/L (ref 22–32)
Calcium: 8.6 mg/dL — ABNORMAL LOW (ref 8.9–10.3)
Chloride: 106 mmol/L (ref 98–111)
Creatinine, Ser: 1.42 mg/dL — ABNORMAL HIGH (ref 0.44–1.00)
GFR, Estimated: 39 mL/min — ABNORMAL LOW (ref >60)
Glucose, Bld: 183 mg/dL — ABNORMAL HIGH (ref 70–99)
Potassium: 3.6 mmol/L (ref 3.5–5.1)
Sodium: 140 mmol/L (ref 135–145)

## 2022-03-03 MED ORDER — HYDROCODONE-ACETAMINOPHEN 5-325 MG PO TABS
1.0000 | ORAL_TABLET | ORAL | Status: DC | PRN
  Administered 2022-03-03: 1 via ORAL
  Filled 2022-03-03: qty 1

## 2022-03-03 MED ORDER — LIDOCAINE HCL 1 % IJ SOLN
INTRAMUSCULAR | Status: AC
  Filled 2022-03-03: qty 20

## 2022-03-03 MED ORDER — FENTANYL CITRATE (PF) 100 MCG/2ML IJ SOLN
INTRAMUSCULAR | Status: AC | PRN
  Administered 2022-03-03 (×2): 50 ug via INTRAVENOUS

## 2022-03-03 MED ORDER — IOHEXOL 300 MG/ML  SOLN
50.0000 mL | Freq: Once | INTRAMUSCULAR | Status: AC | PRN
  Administered 2022-03-03: 15 mL

## 2022-03-03 MED ORDER — LEVOFLOXACIN IN D5W 500 MG/100ML IV SOLN
500.0000 mg | Freq: Once | INTRAVENOUS | Status: AC
  Administered 2022-03-03: 500 mg via INTRAVENOUS
  Filled 2022-03-03: qty 100

## 2022-03-03 MED ORDER — MIDAZOLAM HCL 2 MG/2ML IJ SOLN
INTRAMUSCULAR | Status: AC | PRN
  Administered 2022-03-03 (×4): 1 mg via INTRAVENOUS

## 2022-03-03 MED ORDER — LIDOCAINE-EPINEPHRINE 1 %-1:100000 IJ SOLN
INTRAMUSCULAR | Status: AC | PRN
  Administered 2022-03-03: 10 mL

## 2022-03-03 MED ORDER — HYDROCODONE-ACETAMINOPHEN 5-325 MG PO TABS: 1.0000 | ORAL_TABLET | Freq: Four times a day (QID) | ORAL | 0 refills | Status: DC | PRN

## 2022-03-03 MED ORDER — FENTANYL CITRATE (PF) 100 MCG/2ML IJ SOLN
INTRAMUSCULAR | Status: AC
  Filled 2022-03-03: qty 2

## 2022-03-03 MED ORDER — SODIUM CHLORIDE 0.9 % IV SOLN: INTRAVENOUS | Status: DC

## 2022-03-03 MED ORDER — MIDAZOLAM HCL 2 MG/2ML IJ SOLN
INTRAMUSCULAR | Status: AC
  Filled 2022-03-03: qty 4

## 2022-03-03 MED ORDER — SODIUM CHLORIDE 0.9 % IV SOLN
INTRAVENOUS | Status: DC
Start: 2022-03-03 — End: 2022-03-04

## 2022-03-03 MED ORDER — HEPARIN SOD (PORK) LOCK FLUSH 100 UNIT/ML IV SOLN
500.0000 [IU] | INTRAVENOUS | Status: AC | PRN
  Administered 2022-03-03: 500 [IU]
  Filled 2022-03-03: qty 5

## 2022-03-03 NOTE — Progress Notes (Addendum)
Patient ID: Maria Marquez, female   DOB: [birth year], 76 y.o.   MRN: [id number] Electronic prescription for vicodin (5/325 , #20, no refills, 1-2 tabs every 6 hours for mod pain) sent in to pt's pharmacy , s/p right nephrostomy placement earlier today

## 2022-03-03 NOTE — Procedures (Signed)
Interventional Radiology Procedure Note  Procedure: Right percutaneous nephrostomy tube placement  Complications: None  Estimated Blood Loss: < 10 mL  Findings: US guided puncture of interpolar calyx. 10 Fr PCN formed in renal pelvis. Estimated volume of renal collecting system is 5 mL. PCN capped.  Plan: Discharge in 2 hours if stable.  Maria Marquez. Kathlene Cote, M.D Pager:  [phone]

## 2022-03-03 NOTE — H&P (Signed)
Referring Physician(s): Winter,Christopher Marjory Lies  Supervising Physician: Aletta Edouard  Patient Status:  Maria Marquez OP  Chief Complaint: Urothelial cancer   Subjective: Patient familiar to IR service from Port-A-Cath placement in 2021. 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  Additional medical history includes anemia, GERD, skin cancer, colon polyps, hyperlipidemia, hypertension, osteoarthritis, prediabetes, stage 3 chronic kidney disease. She is s/p cystoscopy and right ureteroscopy with  stent placement completed by Dr. Lovena Neighbours on January 27, 2022. She was found to have recurrent disease with multiple 5 mm papillary bladder tumor recurrences with features concerning for urothelial carcinoma within the renal pelvis, right ureter and the bladder. The final pathology showed low-grade papillary noninvasive tumors. Her stent was removed about 2 weeks ago per pt. She is  scheduled to have local infusion of mitomycin gel (Jelmyto) under the care of Dr. Lovena Neighbours this month and presents today for right PCN placement to assist with treatment. She currently denies fever, chest pain, dyspnea, cough, abdominal pain, back pain, vomiting or bleeding.  She does have headaches and intermittent nausea as well as some lower extremity edema.    Past Medical History:  Diagnosis Date   Anemia associated with chemotherapy    followed by dr Alen Blew   Bilateral lower extremity edema    per pt occasionally ankles swell   Carcinoma of renal pelvis, right [facility])    urologist-- dr winter/  oncologist-- dr Alen Blew;   dx 08/ 2021 ;  chemo 10/ 2021  to 12/ 2021   Chronic kidney disease, stage 3a ([place]) 12/31/2019   Dyspnea on exertion    01-22-2021   per pt no issue most of the time   Family history of breast cancer 05/04/2021   Family history of ovarian cancer 05/04/2021   GERD (gastroesophageal reflux disease)    per pt  occasionally takes tums   History of basal cell carcinoma (BCC) excision    per pt in 1970s on nose   History of chemotherapy    06-13-2020  to 12/ 2021  for right renal pelvis carcinoma   History of colon polyps    History of radiation therapy    11-13-2020 to 11-26-2020---   Right Lung- SBRT- Dr. Gery Pray   HLD (hyperlipidemia)    Hypertension    followed by pcp   Hypothyroidism, postsurgical 1979   followed by pcp   Leukocytosis    Nocturia    OA (osteoarthritis)    PONV (postoperative nausea and vomiting)    Since chemo (always nauseousno ponv with surgeries   Pre-diabetes    Primary adenocarcinoma of upper lobe of right lung [facility])    oncologist--- shadad/  pulmonology-- dr byrum;  dx 02/ 2022;  s/p  SBRT 11-13-2020 to 11-26-2020   Urgency of urination    wears pads   Wears partial dentures    lower   Past Surgical History:  Procedure Laterality Date   COLONOSCOPY  last one 2017   CYSTOSCOPY W/ URETERAL STENT PLACEMENT Right 01/27/2022   Procedure: CYSTOSCOPY WITH RETROGRADE PYELOGRAM/ URETEROSCOPY/POSSIBLE BIOPSY/ POSSIBLE  URETERAL STENT PLACEMENT;  Surgeon: Ceasar Mons, MD;  Location: [facility];  Service: Urology;  Laterality: Right;   CYSTOSCOPY WITH BIOPSY Right 09/16/2021   Procedure: CYSTOSCOPY / URETEROSCOPY WITH BIOPSY/bugbee fulgeration/right retrograde;  Surgeon: Ceasar Mons, MD;  Location: [facility];  Service: Urology;  Laterality: Right;  CYSTOSCOPY WITH URETEROSCOPY Right 04/18/2020   Procedure: CYSTOSCOPY WITH URETEROSCOPY, BIOPSY;  Surgeon: Ceasar Mons, MD;  Location: [facility];  Service: Urology;  Laterality: Right;  ONLY NEEDS 45 MIN   CYSTOSCOPY WITH URETEROSCOPY AND STENT PLACEMENT N/A 12/31/2020   Procedure: CYSTOSCOPY WITH RIGHT URETEROSCOPY/ BILATERAL RETROGRADE PYELOGRAM/RIGHT URETERAL BIOPSY/ LASER ABLATION/RIGHT STENT PLACEMENT;  Surgeon: Ceasar Mons, MD;  Location:  [facility];  Service: Urology;  Laterality: N/A;   CYSTOSCOPY/RETROGRADE/URETEROSCOPY Right 04/22/2021   Procedure: CYSTOSCOPY/RETROGRADE/URETEROSCOPY/ STENT PLACEMENT;  Surgeon: Ceasar Mons, MD;  Location[facility];  Service: Urology;  Laterality: Right;   EYE SURGERY Bilateral 1987   tear ducts   IR IMAGING GUIDED PORT INSERTION  06/04/2020   THYROID LOBECTOMY Right 1979   TOTAL HIP ARTHROPLASTY Right 01/28/2016   Procedure: RIGHT TOTAL HIP ARTHROPLASTY ANTERIOR APPROACH;  Surgeon: [name], MD;  Location: [facility];  Service: Orthopedics;  Laterality: Right;   UMBILICAL HERNIA REPAIR  1980s   VIDEO BRONCHOSCOPY WITH ENDOBRONCHIAL NAVIGATION N/A 06/06/2020   Procedure: VIDEO BRONCHOSCOPY WITH ENDOBRONCHIAL NAVIGATION;  Surgeon: Collene Gobble, MD;  Location: [place];  Service: Thoracic;  Laterality: N/A;   VIDEO BRONCHOSCOPY WITH ENDOBRONCHIAL NAVIGATION N/A 10/10/2020   Procedure: VIDEO BRONCHOSCOPY WITH ENDOBRONCHIAL NAVIGATION;  Surgeon: [name], MD;  Location: [facility];  Service: Thoracic;  Laterality: N/A;      Allergies: Iodine, Keflex [cephalexin], Shellfish allergy, Shellfish-derived products, Lasix [furosemide], Rosuvastatin, Latex, Lisinopril, Sulfa antibiotics, and Tramadol  Medications: Prior to Admission medications   Medication Sig Start Date End Date Taking? Authorizing Provider  albuterol (VENTOLIN HFA) 108 (90 Base) MCG/ACT inhaler INHALE ONE TO TWO PUFFS BY MOUTH INTO THE LUNGS EVERY SIX HOURS AS NEEDED FOR WHEEZING OR SORTNESS OF BREATH 02/15/22  Yes [name], Rayford Halsted, MD  atorvastatin (LIPITOR) 20 MG tablet TAKE 1 TABLET (20 MG TOTAL) BY MOUTH DAILY. Patient taking differently: Take 20 mg by mouth at bedtime. 08/28/21  Yes [name], Rayford Halsted, MD  diphenhydrAMINE (BENADRYL) 50 MG tablet Take 1 tablet (50 mg total) by mouth at bedtime as needed for itching. Take 1 tablet at 7:30 am the morning of the  procedure 02/15/22  Yes Laymond Purser Alysia Penna, NP  estradiol-norethindrone (ACTIVELLA) 1-0.5 MG tablet TAKE ONE TABLET BY MOUTH ONE TIME DAILY 02/08/22  Yes [name], Rayford Halsted, MD  ezetimibe (ZETIA) 10 MG tablet Take 1 tablet (10 mg total) by mouth daily. 02/15/22  Yes [name], Rayford Halsted, MD  famotidine (PEPCID) 20 MG tablet Take 1 tablet (20 mg total) by mouth 2 (two) times daily. 02/16/22  Yes Martinique, Betty G, MD  furosemide (LASIX) 20 MG tablet Take 1 tablet (20 mg total) by mouth daily as needed (leg swelling). 03/02/22  Yes [name], Rayford Halsted, MD  levothyroxine (SYNTHROID) 150 MCG tablet TAKE 1 TABLET EVERY DAY 02/24/22  Yes [name], Rayford Halsted, MD  losartan-hydrochlorothiazide (HYZAAR) 50-12.5 MG tablet TAKE 1 TABLET EVERY DAY Patient taking differently: Take 1 tablet by mouth daily. 03/30/21  Yes Leamon Arnt, MD  phenazopyridine (PYRIDIUM) 200 MG tablet Take 1 tablet (200 mg total) by mouth 3 (three) times daily as needed (for pain with urination). 01/27/22 01/27/23 Yes Ceasar Mons, MD  predniSONE (DELTASONE) 50 MG tablet Take 1 tablet at 7:30 pm the night before procedure. Take 1 tablet 1:30 am the morning before procedure. Take 1 tablet 7:30 am the morning of procedure. 02/15/22  Yes Tyson Alias, NP  XIIDRA 5 % SOLN Place 1 drop into both eyes 2 (two) times daily as needed (dry eyes). 03/01/20  Yes [provider]  EPINEPHRINE 0.3 mg/0.3 mL IJ SOAJ injection Inject 0.3 mg into the muscle as needed for anaphylaxis 11/16/21   [name], Rayford Halsted, MD  lidocaine-prilocaine (EMLA) cream Apply 1 application. topically as needed. 12/30/21   [name], MD  oxybutynin (DITROPAN) 5 MG tablet Take 1 tablet (5 mg total) by mouth every 8 (eight) hours as needed for bladder spasms. 01/27/22   Ceasar Mons, MD  oxyCODONE-acetaminophen (PERCOCET) 5-325 MG tablet Take 1 tablet by mouth every 4 (four) hours as needed for severe pain. 01/27/22   Ceasar Mons, MD     Vital Signs: BP (!) 157/79 (BP Location: Left Arm)   Pulse 66   Temp 98.1 F (36.7 C) (Oral)   Resp 16   Ht 5\' 6"  (1.676 m)   Wt 280 lb 6.8 oz (127.2 kg)   SpO2 95%   BMI 45.26 kg/m   Physical Exam awake, alert.  Chest clear to auscultation bilaterally.  Heart with regular rate and rhythm.  Abdomen obese, soft, positive bowel sounds, nontender.  Bilateral pretibial edema noted.  Imaging: No results found.  Labs:  CBC: Recent Labs    04/30/21 1442 07/03/21 1206 09/16/21 0625 10/06/21 1015 01/27/22 0710 02/15/22 1119  WBC 9.3 7.4  --  8.2  --  7.0  HGB 12.1 12.1 13.6 12.3 13.6 12.4  HCT 37.0 36.5 40.0 36.4 40.0 38.1  PLT 225 217  --  266  --  225    COAGS: No results for input(s): "INR", "APTT" in the last 8760 hours.  BMP: Recent Labs    04/30/21 1442 07/03/21 1206 09/16/21 0625 09/28/21 1630 10/06/21 1015 01/27/22 0710 02/15/22 1119 02/24/22 1312  NA 140 140   < > 137 135 139 137 137  K 3.9 3.9   < > 4.0 3.8 3.7 4.1 4.1  CL 104 104   < > 100 100 101 104 103  CO2 25 24  --  31 25  --  26 25  GLUCOSE 93 108*   < > 139* 124* 125* 133* 133*  BUN 28* 25*   < > 25* 31* 27* 26* 23  CALCIUM 9.3 8.9  --  9.2 9.2  --  8.9 9.1  CREATININE 1.66* 1.35*   < > 1.52* 1.45* 1.50* 1.49* 1.54*  GFRNONAA 32* 41*  --   --  38*  --  37*  --    < > = values in this interval not displayed.    LIVER FUNCTION TESTS: Recent Labs    07/03/21 1206 09/28/21 1630 10/06/21 1015 02/15/22 1119  BILITOT 0.7 0.8 0.6 0.8  AST 12* 17 18 17   ALT 13 20 22 17   ALKPHOS 93 100 71 84  PROT 6.8 7.5 7.0 6.8  ALBUMIN 3.6 4.1 3.7 3.6    Assessment and Plan: Patient familiar to IR service from Port-A-Cath placement in 2021. 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  Additional medical history includes anemia, GERD, skin cancer, colon polyps, hyperlipidemia,  hypertension, osteoarthritis, prediabetes, stage 3 chronic kidney disease. She is s/p cystoscopy and right ureteroscopy with  stent placement completed by Dr. Lovena Neighbours on January 27, 2022. She was found to have recurrent disease with multiple 5 mm papillary bladder tumor recurrences  with features concerning for urothelial carcinoma within the renal pelvis, right ureter and the bladder. The final pathology showed low-grade papillary noninvasive tumors. Her stent was removed about 2 weeks ago per pt. She is  scheduled to have local infusion of mitomycin gel (Jelmyto) under the care of Dr. Lovena Neighbours this month and presents today for right PCN placement to assist with treatment.Risks and benefits of right PCN placement was discussed with the patient including, but not limited to, infection, bleeding, significant bleeding causing loss or decrease in renal function or damage to adjacent structures or inability to place catheter.  All of the patient's questions were answered, patient is agreeable to proceed.  Consent signed and in chart.  Patient has been premedicated for known iodine allergy, although with current GFR of 39 plans are not to administer contrast during case    Electronically Signed: D. Rowe Robert, PA-C 03/03/2022, 8:09 AM   I spent a total of 25 minutes at the the patient's bedside AND on the patient's hospital floor or unit, greater than 50% of which was counseling/coordinating care for right percutaneous nephrostomy

## 2022-03-03 NOTE — Discharge Instructions (Signed)
Please call Interventional Radiology clinic [phone] with any questions or concerns.   You may remove your dressing and shower tomorrow.    Percutaneous Nephrostomy, Care After This sheet gives you information about how to care for yourself after your procedure. Your health care provider may also give you more specific instructions. If you have problems or questions, contact your health careprovider. What can I expect after the procedure? After the procedure, it is common to have: Some soreness where the nephrostomy tube was inserted (tube insertion site). Blood-tinged drainage from the nephrostomy tube for the first 24 hours. Follow these instructions at home: Activity  Do not lift anything that is heavier than 10 lb (4.5 kg), or the limit that you are told, until your health care provider says that it is safe. Return to your normal activities as told by your health care provider. Ask your health care provider what activities are safe for you. Avoid activities that may cause the nephrostomy tubing to bend. Do not take baths, swim, or use a hot tub until your health care provider approves. Ask your health care provider if you can take showers. Cover the nephrostomy tube bandage (dressing) with a watertight covering when you take a shower. If you were given a sedative during the procedure, it can affect you for several hours. Do not drive or operate machinery until your health care provider says that it is safe.   Care of the tube insertion site  Follow instructions from your health care provider about how to take care of your tube insertion site. Make sure you: Wash your hands with soap and water for at least 20 seconds before you change your dressing. If soap and water are not available, use hand sanitizer. Change your dressing as told by your health care provider. Be careful not to pull on the tube while removing the dressing. When you change the dressing, wash the skin around the  tube, rinse well, and pat the skin dry. Check the tube insertion area every day for signs of infection. Check for: Redness, swelling, or pain. Fluid or blood. Warmth. Pus or a bad smell.   General instructions Take over-the-counter and prescription medicines only as told by your health care provider. Keep all follow-up visits as told by your health care provider. This is important. The nephrostomy tube will need to be changed every 8-12 weeks. Contact a health care provider if: You have problems with any of the valves or tubing. You have persistent pain or soreness in your back. You have redness, swelling, or pain around your tube insertion site. You have fluid or blood coming from your tube insertion site. Your tube insertion site feels warm to the touch. You have pus or a bad smell coming from your tube insertion site. You have increased urine output or you feel burning when urinating. Get help right away if: You have pain in your abdomen during the first week. You have chest pain or have trouble breathing. You have a new appearance of blood in your urine. You have a fever or chills. You have back pain that is not relieved by your medicine. You have decreased urine output. Your nephrostomy tube comes out. Summary After the procedure, it is common to have some soreness where the nephrostomy tube was inserted (tube insertion site). Follow instructions from your health care provider about how to take care of your tube insertion site, nephrostomy tube, and drainage bag. Keep all follow-up visits for care and for changing the tube.  This information is not intended to replace advice given to you by your health care provider. Make sure you discuss any questions you have with your healthcare provider. Document Revised: 09/04/2019 Document Reviewed: 09/04/2019 Elsevier Patient Education  2022 [place].   Moderate Conscious Sedation, Adult, Care After This sheet gives you  information about how to care for yourself after your procedure. Your health care provider may also give you more specific instructions. If you have problems or questions, contact your health care provider. What can I expect after the procedure? After the procedure, it is common to have: Sleepiness for several hours. Impaired judgment for several hours. Difficulty with balance. Vomiting if you eat too soon. Follow these instructions at home: For the time period you were told by your health care provider: Rest. Do not participate in activities where you could fall or become injured. Do not drive or use machinery. Do not drink alcohol. Do not take sleeping pills or medicines that cause drowsiness. Do not make important decisions or sign legal documents. Do not take care of children on your own.      Eating and drinking Follow the diet recommended by your health care provider. Drink enough fluid to keep your urine pale yellow. If you vomit: Drink water, juice, or soup when you can drink without vomiting. Make sure you have little or no nausea before eating solid foods.   General instructions Take over-the-counter and prescription medicines only as told by your health care provider. Have a responsible adult stay with you for the time you are told. It is important to have someone help care for you until you are awake and alert. Do not smoke. Keep all follow-up visits as told by your health care provider. This is important. Contact a health care provider if: You are still sleepy or having trouble with balance after 24 hours. You feel light-headed. You keep feeling nauseous or you keep vomiting. You develop a rash. You have a fever. You have redness or swelling around the IV site. Get help right away if: You have trouble breathing. You have new-onset confusion at home. Summary After the procedure, it is common to feel sleepy, have impaired judgment, or feel nauseous if you eat too  soon. Rest after you get home. Know the things you should not do after the procedure. Follow the diet recommended by your health care provider and drink enough fluid to keep your urine pale yellow. Get help right away if you have trouble breathing or new-onset confusion at home. This information is not intended to replace advice given to you by your health care provider. Make sure you discuss any questions you have with your health care provider. Document Revised: 12/07/2019 Document Reviewed: 07/05/2019 Elsevier Patient Education  2021 [employer].

## 2022-03-09 ENCOUNTER — Telehealth: Payer: Self-pay | Admitting: Oncology

## 2022-03-09 NOTE — Telephone Encounter (Signed)
Called patient regarding rescheduled August appointments due to provider pal, patient is notified.

## 2022-03-10 DIAGNOSIS — C651 Malignant neoplasm of right renal pelvis: Secondary | ICD-10-CM | POA: Diagnosis not present

## 2022-03-17 ENCOUNTER — Encounter: Payer: Self-pay | Admitting: Internal Medicine

## 2022-03-17 ENCOUNTER — Other Ambulatory Visit: Payer: Self-pay | Admitting: Internal Medicine

## 2022-03-17 ENCOUNTER — Ambulatory Visit (INDEPENDENT_AMBULATORY_CARE_PROVIDER_SITE_OTHER): Payer: Medicare HMO | Admitting: Internal Medicine

## 2022-03-17 VITALS — BP 120/68 | HR 80 | Temp 97.7°F | Ht 65.5 in | Wt 279.6 lb

## 2022-03-17 DIAGNOSIS — E559 Vitamin D deficiency, unspecified: Secondary | ICD-10-CM

## 2022-03-17 DIAGNOSIS — C689 Malignant neoplasm of urinary organ, unspecified: Secondary | ICD-10-CM

## 2022-03-17 DIAGNOSIS — I700 Atherosclerosis of aorta: Secondary | ICD-10-CM | POA: Diagnosis not present

## 2022-03-17 DIAGNOSIS — E782 Mixed hyperlipidemia: Secondary | ICD-10-CM

## 2022-03-17 DIAGNOSIS — I10 Essential (primary) hypertension: Secondary | ICD-10-CM

## 2022-03-17 DIAGNOSIS — E890 Postprocedural hypothyroidism: Secondary | ICD-10-CM | POA: Diagnosis not present

## 2022-03-17 DIAGNOSIS — C651 Malignant neoplasm of right renal pelvis: Secondary | ICD-10-CM | POA: Diagnosis not present

## 2022-03-17 DIAGNOSIS — Z0000 Encounter for general adult medical examination without abnormal findings: Secondary | ICD-10-CM | POA: Diagnosis not present

## 2022-03-17 LAB — LIPID PANEL
Cholesterol: 156 mg/dL (ref 0–200)
HDL: 47.4 mg/dL (ref >39.00)
LDL Cholesterol: 74 mg/dL (ref 0–99)
NonHDL: 108.41
Total CHOL/HDL Ratio: 3
Triglycerides: 173 mg/dL — ABNORMAL HIGH (ref 0.0–149.0)
VLDL: 34.6 mg/dL (ref 0.0–40.0)

## 2022-03-17 LAB — VITAMIN D 25 HYDROXY (VIT D DEFICIENCY, FRACTURES): VITD: 22 ng/mL — ABNORMAL LOW (ref 30.00–100.00)

## 2022-03-17 MED ORDER — VITAMIN D (ERGOCALCIFEROL) 1.25 MG (50000 UNIT) PO CAPS: 50000.0000 [IU] | ORAL_CAPSULE | ORAL | 0 refills | Status: AC

## 2022-03-17 NOTE — Progress Notes (Signed)
Established Patient Office Visit     CC/Reason for Visit: Annual preventive exam and subsequent Medicare wellness visit  HPI: Maria Marquez is a 76 y.o. female who is coming in today for the above mentioned reasons. 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  Morbid obesity, hyperlipidemia, aortic atherosclerosis, hypertension, vitamin B12 deficiency, stage III chronic kidney disease.  She has no acute complaints today.  She has routine eye and dental care, no perceived hearing issues, has her mammogram scheduled for September, follows routinely with GYN.  Had a colonoscopy in 2017.   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[facility])    urologist-- dr winter/  oncologist-- dr Alen Blew;   dx 08/ 2021 ;  chemo 10/ 2021  to 12/ 2021   Chronic kidney disease, stage 3a ([place]) 12/31/2019   Dyspnea on exertion    01-22-2021   per pt no issue most of the time   Family history of breast cancer 05/04/2021   Family history of ovarian cancer 05/04/2021   GERD (gastroesophageal reflux disease)    per pt occasionally takes tums   History of basal cell carcinoma (BCC) excision    per pt in 1970s on nose   History of chemotherapy    06-13-2020  to 12/ 2021  for right renal pelvis carcinoma   History of colon polyps    History of radiation therapy    11-13-2020 to 11-26-2020---   Right Lung- SBRT- Dr. Gery Pray   HLD (hyperlipidemia)    Hypertension    followed by pcp   Hypothyroidism, postsurgical 1979   followed by pcp   Leukocytosis    Nocturia    OA (osteoarthritis)    PONV (postoperative nausea and vomiting)    Since chemo (always nauseousno ponv with surgeries   Pre-diabetes    Primary  adenocarcinoma of upper lobe of right lung [facility])    oncologist--- shadad/  pulmonology-- dr byrum;  dx 02/ 2022;  s/p  SBRT 11-13-2020 to 11-26-2020   Urgency of urination    wears pads   Wears partial dentures    lower    Past Surgical History:  Procedure Laterality Date   COLONOSCOPY  last one 2017   CYSTOSCOPY W/ URETERAL STENT PLACEMENT Right 01/27/2022   Procedure: CYSTOSCOPY WITH RETROGRADE PYELOGRAM/ URETEROSCOPY/POSSIBLE BIOPSY/ POSSIBLE  URETERAL STENT PLACEMENT;  Surgeon: Ceasar Mons, MD;  Location: [facility];  Service: Urology;  Laterality: Right;   CYSTOSCOPY WITH BIOPSY Right 09/16/2021   Procedure: CYSTOSCOPY / URETEROSCOPY WITH BIOPSY/bugbee fulgeration/right retrograde;  Surgeon: Ceasar Mons, MD;  Location: [facility];  Service: Urology;  Laterality: Right;   CYSTOSCOPY WITH URETEROSCOPY Right 04/18/2020   Procedure: CYSTOSCOPY WITH URETEROSCOPY, BIOPSY;  Surgeon: Ceasar Mons, MD;  Location: [facility];  Service: Urology;  Laterality: Right;  ONLY NEEDS 45 MIN   CYSTOSCOPY WITH URETEROSCOPY AND STENT PLACEMENT N/A 12/31/2020   Procedure: CYSTOSCOPY WITH RIGHT URETEROSCOPY/ BILATERAL RETROGRADE PYELOGRAM/RIGHT URETERAL BIOPSY/ LASER ABLATION/RIGHT STENT PLACEMENT;  Surgeon: Ceasar Mons, MD;  Location[facility];  Service: Urology;  Laterality: N/A;   CYSTOSCOPY/RETROGRADE/URETEROSCOPY Right 04/22/2021   Procedure: CYSTOSCOPY/RETROGRADE/URETEROSCOPY/ STENT PLACEMENT;  Surgeon: Ceasar Mons, MD;  Location: [place];  Service: Urology;  Laterality: Right;   EYE SURGERY Bilateral 1987   tear ducts   IR IMAGING GUIDED PORT INSERTION  06/04/2020   IR NEPHROSTOMY PLACEMENT RIGHT  03/03/2022   THYROID LOBECTOMY Right 1979   TOTAL HIP ARTHROPLASTY Right 01/28/2016   Procedure: RIGHT TOTAL HIP ARTHROPLASTY ANTERIOR APPROACH;  Surgeon: [name], MD;   Location: [facility];  Service: Orthopedics;  Laterality: Right;   UMBILICAL HERNIA REPAIR  1980s   VIDEO BRONCHOSCOPY WITH ENDOBRONCHIAL NAVIGATION N/A 06/06/2020   Procedure: VIDEO BRONCHOSCOPY WITH ENDOBRONCHIAL NAVIGATION;  Surgeon: Collene Gobble, MD;  Location: [place];  Service: Thoracic;  Laterality: N/A;   VIDEO BRONCHOSCOPY WITH ENDOBRONCHIAL NAVIGATION N/A 10/10/2020   Procedure: VIDEO BRONCHOSCOPY WITH ENDOBRONCHIAL NAVIGATION;  Surgeon: [name], MD;  Location: [place];  Service: Thoracic;  Laterality: N/A;    Social History:  reports that she quit smoking about 22 months ago. Her smoking use included cigarettes. She has a 45.00 pack-year smoking history. She has never used smokeless tobacco. She reports that she does not currently use alcohol. She reports that she does not use drugs.  Allergies: Allergies  Allergen Reactions   Iodine Shortness Of Breath   Keflex [Cephalexin] Shortness Of Breath, Rash and Tinitus   Shellfish Allergy Hives   Shellfish-Derived Products Anaphylaxis and Hives   Hydrocodone Hives and Itching    Itching throat   Lasix [Furosemide] Itching   Rosuvastatin Other (See Comments)    Muscle Pain in Thighs   Latex Rash   Lisinopril Cough   Sulfa Antibiotics Nausea Only   Tramadol Itching    Family History:  Family History  Problem Relation Age of Onset   Arthritis Mother    Breast cancer Mother 109   Schizophrenia Mother    Diabetes Father    Heart disease Father    Kidney disease Father    Stroke Sister    Lung cancer Brother        dx > 51; work-related exposures   Heart disease Brother    Heart disease Brother    Vision loss Brother        Glaucoma   Alcohol abuse Brother    Lung cancer Maternal Aunt        dx > 62   Colon cancer Paternal Aunt        dx > 4   Head & neck cancer Paternal Uncle        dx 8s   Ovarian cancer Daughter 33   Breast cancer Niece 47   Leukemia Cousin        d. 20s     Current Outpatient  Medications:    albuterol (VENTOLIN HFA) 108 (90 Base) MCG/ACT inhaler, INHALE ONE TO TWO PUFFS BY MOUTH INTO THE LUNGS EVERY SIX HOURS AS NEEDED FOR WHEEZING OR SORTNESS OF BREATH, Disp: 18 g, Rfl: 0   atorvastatin (LIPITOR) 20 MG tablet, TAKE 1 TABLET (20 MG TOTAL) BY MOUTH DAILY. (Patient taking differently: Take 20 mg by mouth at bedtime.), Disp: 90 tablet, Rfl: 1   diphenhydrAMINE (BENADRYL) 50 MG tablet, Take 1 tablet (50 mg total) by mouth at bedtime as needed for itching. Take 1 tablet at 7:30 am the morning of the procedure, Disp: 1 tablet, Rfl: 0   EPINEPHRINE 0.3 mg/0.3 mL IJ SOAJ injection, Inject 0.3 mg into the muscle as needed for anaphylaxis, Disp: 2 each, Rfl: 0   estradiol-norethindrone (ACTIVELLA) 1-0.5 MG tablet, TAKE ONE  TABLET BY MOUTH ONE TIME DAILY, Disp: 28 tablet, Rfl: 0   ezetimibe (ZETIA) 10 MG tablet, Take 1 tablet (10 mg total) by mouth daily., Disp: 90 tablet, Rfl: 1   famotidine (PEPCID) 20 MG tablet, Take 1 tablet (20 mg total) by mouth 2 (two) times daily., Disp: 60 tablet, Rfl: 2   furosemide (LASIX) 20 MG tablet, Take 1 tablet (20 mg total) by mouth daily as needed (leg swelling)., Disp: 30 tablet, Rfl: 3   HYDROcodone-acetaminophen (NORCO) 5-325 MG tablet, Take 1-2 tablets by mouth every 6 (six) hours as needed for moderate pain., Disp: 20 tablet, Rfl: 0   levothyroxine (SYNTHROID) 150 MCG tablet, TAKE 1 TABLET EVERY DAY, Disp: 90 tablet, Rfl: 1   lidocaine-prilocaine (EMLA) cream, Apply 1 application. topically as needed., Disp: 30 g, Rfl: 0   losartan-hydrochlorothiazide (HYZAAR) 50-12.5 MG tablet, TAKE 1 TABLET EVERY DAY (Patient taking differently: Take 1 tablet by mouth daily.), Disp: 90 tablet, Rfl: 3   oxybutynin (DITROPAN) 5 MG tablet, Take 1 tablet (5 mg total) by mouth every 8 (eight) hours as needed for bladder spasms., Disp: 30 tablet, Rfl: 1   oxyCODONE-acetaminophen (PERCOCET) 5-325 MG tablet, Take 1 tablet by mouth every 4 (four) hours as needed for  severe pain., Disp: 20 tablet, Rfl: 0   phenazopyridine (PYRIDIUM) 200 MG tablet, Take 1 tablet (200 mg total) by mouth 3 (three) times daily as needed (for pain with urination)., Disp: 30 tablet, Rfl: 0   predniSONE (DELTASONE) 50 MG tablet, Take 1 tablet at 7:30 pm the night before procedure. Take 1 tablet 1:30 am the morning before procedure. Take 1 tablet 7:30 am the morning of procedure., Disp: 3 tablet, Rfl: 0   XIIDRA 5 % SOLN, Place 1 drop into both eyes 2 (two) times daily as needed (dry eyes)., Disp: , Rfl:   Review of Systems:  Constitutional: Denies fever, chills, diaphoresis, appetite change. HEENT: Denies photophobia, eye pain, redness, hearing loss, ear pain, congestion, sore throat, rhinorrhea, sneezing, mouth sores, trouble swallowing, neck pain, neck stiffness and tinnitus.   Respiratory: Denies SOB, DOE, cough, chest tightness,  and wheezing.   Cardiovascular: Denies chest pain, palpitations and leg swelling.  Gastrointestinal: Denies nausea, vomiting, abdominal pain, diarrhea, constipation, blood in stool and abdominal distention.  Genitourinary: Denies dysuria, urgency, frequency, hematuria, flank pain and difficulty urinating.  Endocrine: Denies: hot or cold intolerance, sweats, changes in hair or nails, polyuria, polydipsia. Musculoskeletal: Denies myalgias, back pain, joint swelling, arthralgias and gait problem.  Skin: Denies pallor, rash and wound.  Neurological: Denies dizziness, seizures, syncope, weakness, light-headedness, numbness and headaches.  Hematological: Denies adenopathy. Easy bruising, personal or family bleeding history  Psychiatric/Behavioral: Denies suicidal ideation, mood changes, confusion, nervousness, sleep disturbance and agitation    Physical Exam: Vitals:   03/17/22 0857  BP: 120/68  Pulse: 80  Temp: 97.7 F (36.5 C)  TempSrc: Oral  SpO2: 99%  Weight: 279 lb 9.6 oz (126.8 kg)  Height: 5' 5.5" (1.664 m)    Body mass index is 45.82  kg/m.   Constitutional: NAD, calm, comfortable, obese Eyes: PERRL, lids and conjunctivae normal ENMT: Mucous membranes are moist. Posterior pharynx clear of any exudate or lesions. Normal dentition. Tympanic membrane is pearly white, no erythema or bulging. Neck: normal, supple, no masses, no thyromegaly Respiratory: clear to auscultation bilaterally, no wheezing, no crackles. Normal respiratory effort. No accessory muscle use.  Cardiovascular: Regular rate and rhythm, no murmurs / rubs / gallops. No extremity edema. 2+ pedal pulses.  No carotid bruits.  Abdomen: no tenderness, no masses palpated. No hepatosplenomegaly. Bowel sounds positive.  Musculoskeletal: no clubbing / cyanosis. No joint deformity upper and lower extremities. Good ROM, no contractures. Normal muscle tone.  Skin: no rashes, lesions, ulcers. No induration Neurologic: CN 2-12 grossly intact. Sensation intact, DTR normal. Strength 5/5 in all 4.  Psychiatric: Normal judgment and insight. Alert and oriented x 3. Normal mood.    Subsequent Medicare wellness visit   1. Risk factors, based on past  M,S,F -cardiovascular disease risk factors include age, obesity, known aortic atherosclerosis and hyperlipidemia, hypertension   2.  Physical activities: Not much physical activity   3.  Depression/mood: No history of depression   4.  Hearing: No perceived issues   5.  ADL's: Independent in all ADLs   6.  Fall risk: Low fall risk   7.  Home safety: No problems identified   8.  Height weight, and visual acuity: height and weight as above, vision:  Vision Screening   Right eye Left eye Both eyes  Without correction 20/50 20/63 20/25   With correction        9.  Counseling: We have discussed lifestyle changes   10. Lab orders based on risk factors: Laboratory update will be reviewed   11. Referral : None today   12. Care plan: Follow-up with me in 6 months   13. Cognitive assessment: No cognitive impairment   14.  Screening: Patient provided with a written and personalized 5-10 year screening schedule in the AVS. yes   15. Provider List Update: PCP, urology  16. Advance Directives: Full code   17. Opioids: Patient is not on any opioid prescriptions and has no risk factors for a substance use disorder.   [place] Office Visit from 02/16/2022 in [place] [facility] [place]  PHQ-9 Total Score 26          09/24/2021   11:23 AM 10/26/2021   11:14 AM 02/15/2022   11:01 AM 02/16/2022    3:30 PM 03/03/2022    7:52 AM  Fall Risk  Falls in the past year? 0   0   Was there an injury with Fall?    0   Fall Risk Category Calculator    0   Fall Risk Category    Low   Patient Fall Risk Level  Low fall risk Low fall risk Low fall risk Low fall risk  Fall risk Follow up    Falls evaluation completed      Impression and Plan:  Encounter for preventive health examination  Mixed hyperlipidemia - Plan: Lipid panel  Vitamin D deficiency - Plan: VITAMIN D 25 Hydroxy (Vit-D Deficiency, Fractures)  Urothelial cancer ([place])  Postoperative hypothyroidism - Plan: TSH  Morbid obesity ([place]) - Plan: Hemoglobin A1c, Vitamin B12, VITAMIN D 25 Hydroxy (Vit-D Deficiency, Fractures)  Essential hypertension - Plan: CBC with Differential/Platelet, Comprehensive metabolic panel  Aortic atherosclerosis ([place]) - Plan: Lipid panel  -Recommend routine eye and dental care. -Immunizations: All immunizations are up-to-date and age-appropriate -Healthy lifestyle discussed in detail. -Labs to be updated today. -Colon cancer screening: 12/2015 -Breast cancer screening: 12/2020, is scheduled for September -Cervical cancer screening: Follows with GYN for this, has appointment in September -Lung cancer screening: Not applicable -Prostate cancer screening: Not applicable -DEXA: 09/3760     Patient Instructions  -Nice seeing you today!!  -Lab work today; will notify you once results are available.  -Schedule  follow up in 6 months.   Matix Henshaw  [name], MD [facility]

## 2022-03-17 NOTE — Patient Instructions (Signed)
-  Nice seeing you today!!  -Lab work today; will notify you once results are available.  -Schedule follow up in 6 months.

## 2022-03-18 ENCOUNTER — Other Ambulatory Visit: Payer: Self-pay | Admitting: *Deleted

## 2022-03-18 DIAGNOSIS — E559 Vitamin D deficiency, unspecified: Secondary | ICD-10-CM

## 2022-03-23 ENCOUNTER — Encounter: Payer: Self-pay | Admitting: Internal Medicine

## 2022-03-24 DIAGNOSIS — C651 Malignant neoplasm of right renal pelvis: Secondary | ICD-10-CM | POA: Diagnosis not present

## 2022-03-26 ENCOUNTER — Other Ambulatory Visit: Payer: Medicare HMO

## 2022-03-29 ENCOUNTER — Other Ambulatory Visit (INDEPENDENT_AMBULATORY_CARE_PROVIDER_SITE_OTHER): Payer: Medicare HMO

## 2022-03-29 ENCOUNTER — Encounter: Payer: Self-pay | Admitting: Internal Medicine

## 2022-03-29 DIAGNOSIS — I10 Essential (primary) hypertension: Secondary | ICD-10-CM

## 2022-03-29 DIAGNOSIS — E890 Postprocedural hypothyroidism: Secondary | ICD-10-CM | POA: Diagnosis not present

## 2022-03-29 DIAGNOSIS — E119 Type 2 diabetes mellitus without complications: Secondary | ICD-10-CM | POA: Insufficient documentation

## 2022-03-29 LAB — CBC WITH DIFFERENTIAL/PLATELET
Basophils Absolute: 0 10*3/uL (ref 0.0–0.1)
Basophils Relative: 0.4 % (ref 0.0–3.0)
Eosinophils Absolute: 0.3 10*3/uL (ref 0.0–0.7)
Eosinophils Relative: 4.3 % (ref 0.0–5.0)
HCT: 37.4 % (ref 36.0–46.0)
Hemoglobin: 12.7 g/dL (ref 12.0–15.0)
Lymphocytes Relative: 23.9 % (ref 12.0–46.0)
Lymphs Abs: 1.7 10*3/uL (ref 0.7–4.0)
MCHC: 33.9 g/dL (ref 30.0–36.0)
MCV: 89.3 fl (ref 78.0–100.0)
Monocytes Absolute: 0.6 10*3/uL (ref 0.1–1.0)
Monocytes Relative: 8.6 % (ref 3.0–12.0)
Neutro Abs: 4.3 10*3/uL (ref 1.4–7.7)
Neutrophils Relative %: 62.8 % (ref 43.0–77.0)
Platelets: 252 10*3/uL (ref 150.0–400.0)
RBC: 4.19 Mil/uL (ref 3.87–5.11)
RDW: 14.8 % (ref 11.5–15.5)
WBC: 6.9 10*3/uL (ref 4.0–10.5)

## 2022-03-29 LAB — COMPREHENSIVE METABOLIC PANEL
ALT: 12 U/L (ref 0–35)
AST: 13 U/L (ref 0–37)
Albumin: 3.9 g/dL (ref 3.5–5.2)
Alkaline Phosphatase: 74 U/L (ref 39–117)
BUN: 25 mg/dL — ABNORMAL HIGH (ref 6–23)
CO2: 27 mEq/L (ref 19–32)
Calcium: 8.9 mg/dL (ref 8.4–10.5)
Chloride: 105 mEq/L (ref 96–112)
Creatinine, Ser: 1.57 mg/dL — ABNORMAL HIGH (ref 0.40–1.20)
GFR: 32.38 mL/min — ABNORMAL LOW (ref >60.00)
Glucose, Bld: 132 mg/dL — ABNORMAL HIGH (ref 70–99)
Potassium: 3.7 mEq/L (ref 3.5–5.1)
Sodium: 136 mEq/L (ref 135–145)
Total Bilirubin: 0.8 mg/dL (ref 0.2–1.2)
Total Protein: 6.7 g/dL (ref 6.0–8.3)

## 2022-03-29 LAB — HEMOGLOBIN A1C: Hgb A1c MFr Bld: 7.5 % — ABNORMAL HIGH (ref 4.6–6.5)

## 2022-03-29 LAB — VITAMIN B12: Vitamin B-12: 275 pg/mL (ref 211–911)

## 2022-03-29 LAB — TSH: TSH: 2.94 u[IU]/mL (ref 0.35–5.50)

## 2022-03-30 ENCOUNTER — Encounter: Payer: Self-pay | Admitting: Internal Medicine

## 2022-03-31 ENCOUNTER — Encounter (INDEPENDENT_AMBULATORY_CARE_PROVIDER_SITE_OTHER): Payer: Self-pay

## 2022-03-31 DIAGNOSIS — C651 Malignant neoplasm of right renal pelvis: Secondary | ICD-10-CM | POA: Diagnosis not present

## 2022-04-02 ENCOUNTER — Encounter: Payer: Self-pay | Admitting: Internal Medicine

## 2022-04-03 ENCOUNTER — Other Ambulatory Visit: Payer: Self-pay | Admitting: Internal Medicine

## 2022-04-03 DIAGNOSIS — Z79890 Hormone replacement therapy (postmenopausal): Secondary | ICD-10-CM

## 2022-04-04 ENCOUNTER — Other Ambulatory Visit: Payer: Self-pay | Admitting: Family Medicine

## 2022-04-04 ENCOUNTER — Encounter: Payer: Self-pay | Admitting: Internal Medicine

## 2022-04-04 ENCOUNTER — Other Ambulatory Visit: Payer: Self-pay | Admitting: Internal Medicine

## 2022-04-05 ENCOUNTER — Encounter: Payer: Self-pay | Admitting: Internal Medicine

## 2022-04-06 ENCOUNTER — Inpatient Hospital Stay: Payer: Medicare HMO | Attending: Oncology

## 2022-04-06 DIAGNOSIS — Z8551 Personal history of malignant neoplasm of bladder: Secondary | ICD-10-CM | POA: Insufficient documentation

## 2022-04-06 DIAGNOSIS — I700 Atherosclerosis of aorta: Secondary | ICD-10-CM | POA: Insufficient documentation

## 2022-04-06 DIAGNOSIS — Z8553 Personal history of malignant neoplasm of renal pelvis: Secondary | ICD-10-CM | POA: Insufficient documentation

## 2022-04-06 DIAGNOSIS — C3411 Malignant neoplasm of upper lobe, right bronchus or lung: Secondary | ICD-10-CM | POA: Insufficient documentation

## 2022-04-06 DIAGNOSIS — K8020 Calculus of gallbladder without cholecystitis without obstruction: Secondary | ICD-10-CM | POA: Insufficient documentation

## 2022-04-07 DIAGNOSIS — C651 Malignant neoplasm of right renal pelvis: Secondary | ICD-10-CM | POA: Diagnosis not present

## 2022-04-08 ENCOUNTER — Telehealth: Payer: Self-pay | Admitting: Pharmacist

## 2022-04-08 NOTE — Chronic Care Management (AMB) (Addendum)
    Chronic Care Management Pharmacy Assistant   Name: Maria Marquez  MRN: [id number] DOB: [birth year]  Reason for Encounter: Follow up 02/15/2022 ED visit   Recent office visits:  03/02/2022 [name] MD - Patient was seen for Bilateral lower extremity edema and additional concerns. Discontinued Amiloride and started Furosemide 20 mg daily as needed.  -I believe her lower extremity edema is due to a combination of chronic venous insufficiency, low oncotic pressure, as well as her stage III chronic kidney disease.  I do not have any echocardiogram on file to evaluate her ejection fraction or diastolic function. -I will discontinue amiloride and prescribe Lasix 20 mg to take as needed for lower extremity edema.  I have advised that she get fitted for compression stockings and follow with a high-protein diet to increase oncotic pressure.  Hospital visits:  Patient was seen at [facility] ED on 02/15/2022 ( 3 hours) due to peripheral edema and shortness of breath.    New?Medications Started at [facility] Discharge:?? aMILoride 5 MG take 1 tablet daily Medication Changes at Hospital Discharge: No medication changes  Medications Discontinued at Hospital Discharge: No medications discontinued Medications that remain the same after Hospital Discharge:??  -All other medications will remain the same.    Spoke with patient, she states her leg swelling has gone down slightly, she currently denies any shortness of breath.  Patient states Dr. [name] discontinued Amiloride and placed her on Lasix 20 mg 1 tablet daily as needed for leg swelling. Patient has been concerned about taking this due to all her other health issues and has chosen not to take it often. Patient was advised not to be afraid to take this medication when her legs are swollen or tight feeling, explained to her this medication along with removing fluid from her legs will remove excess fluid from other  parts of her body that the fluid build up can be effecting, also advised her to keep her legs elevated when sitting. Patient voiced a better understanding and will not be afraid to use Lasix when she needs it, patient was very thankful for the explanation of how this medicine works and for all the care she has gotten from [facility] clinical pharmacist and all of her doctors and the staff.      [place] Pharmacist Assistant [phone]

## 2022-04-12 NOTE — Telephone Encounter (Signed)
Error- please disregard

## 2022-04-13 ENCOUNTER — Ambulatory Visit: Payer: Medicare HMO | Admitting: Oncology

## 2022-04-14 DIAGNOSIS — K7460 Unspecified cirrhosis of liver: Secondary | ICD-10-CM | POA: Diagnosis not present

## 2022-04-14 DIAGNOSIS — E1122 Type 2 diabetes mellitus with diabetic chronic kidney disease: Secondary | ICD-10-CM | POA: Diagnosis not present

## 2022-04-14 DIAGNOSIS — N390 Urinary tract infection, site not specified: Secondary | ICD-10-CM | POA: Diagnosis not present

## 2022-04-14 DIAGNOSIS — C651 Malignant neoplasm of right renal pelvis: Secondary | ICD-10-CM | POA: Diagnosis not present

## 2022-04-14 DIAGNOSIS — N189 Chronic kidney disease, unspecified: Secondary | ICD-10-CM | POA: Diagnosis not present

## 2022-04-14 DIAGNOSIS — C3411 Malignant neoplasm of upper lobe, right bronchus or lung: Secondary | ICD-10-CM | POA: Diagnosis not present

## 2022-04-14 DIAGNOSIS — I129 Hypertensive chronic kidney disease with stage 1 through stage 4 chronic kidney disease, or unspecified chronic kidney disease: Secondary | ICD-10-CM | POA: Diagnosis not present

## 2022-04-14 DIAGNOSIS — C689 Malignant neoplasm of urinary organ, unspecified: Secondary | ICD-10-CM | POA: Diagnosis not present

## 2022-04-14 DIAGNOSIS — N1832 Chronic kidney disease, stage 3b: Secondary | ICD-10-CM | POA: Diagnosis not present

## 2022-04-16 ENCOUNTER — Encounter: Payer: Self-pay | Admitting: Internal Medicine

## 2022-04-20 ENCOUNTER — Inpatient Hospital Stay: Payer: Medicare HMO

## 2022-04-20 ENCOUNTER — Inpatient Hospital Stay (HOSPITAL_BASED_OUTPATIENT_CLINIC_OR_DEPARTMENT_OTHER): Payer: Medicare HMO | Admitting: Oncology

## 2022-04-20 ENCOUNTER — Other Ambulatory Visit: Payer: Self-pay

## 2022-04-20 VITALS — BP 143/68 | HR 73 | Temp 97.6°F | Resp 18 | Ht 65.5 in | Wt 279.9 lb

## 2022-04-20 DIAGNOSIS — C3411 Malignant neoplasm of upper lobe, right bronchus or lung: Secondary | ICD-10-CM | POA: Diagnosis not present

## 2022-04-20 DIAGNOSIS — C659 Malignant neoplasm of unspecified renal pelvis: Secondary | ICD-10-CM | POA: Diagnosis not present

## 2022-04-20 DIAGNOSIS — I700 Atherosclerosis of aorta: Secondary | ICD-10-CM | POA: Diagnosis not present

## 2022-04-20 DIAGNOSIS — Z95828 Presence of other vascular implants and grafts: Secondary | ICD-10-CM

## 2022-04-20 DIAGNOSIS — K8020 Calculus of gallbladder without cholecystitis without obstruction: Secondary | ICD-10-CM | POA: Diagnosis not present

## 2022-04-20 DIAGNOSIS — Z8551 Personal history of malignant neoplasm of bladder: Secondary | ICD-10-CM | POA: Diagnosis not present

## 2022-04-20 DIAGNOSIS — Z8553 Personal history of malignant neoplasm of renal pelvis: Secondary | ICD-10-CM | POA: Diagnosis not present

## 2022-04-20 LAB — CBC WITH DIFFERENTIAL (CANCER CENTER ONLY)
Abs Immature Granulocytes: 0.02 10*3/uL (ref 0.00–0.07)
Basophils Absolute: 0 10*3/uL (ref 0.0–0.1)
Basophils Relative: 0 %
Eosinophils Absolute: 0.3 10*3/uL (ref 0.0–0.5)
Eosinophils Relative: 4 %
HCT: 35.5 % — ABNORMAL LOW (ref 36.0–46.0)
Hemoglobin: 11.9 g/dL — ABNORMAL LOW (ref 12.0–15.0)
Immature Granulocytes: 0 %
Lymphocytes Relative: 19 %
Lymphs Abs: 1.4 10*3/uL (ref 0.7–4.0)
MCH: 30.1 pg (ref 26.0–34.0)
MCHC: 33.5 g/dL (ref 30.0–36.0)
MCV: 89.9 fL (ref 80.0–100.0)
Monocytes Absolute: 0.7 10*3/uL (ref 0.1–1.0)
Monocytes Relative: 10 %
Neutro Abs: 4.9 10*3/uL (ref 1.7–7.7)
Neutrophils Relative %: 67 %
Platelet Count: 198 10*3/uL (ref 150–400)
RBC: 3.95 MIL/uL (ref 3.87–5.11)
RDW: 14.1 % (ref 11.5–15.5)
WBC Count: 7.2 10*3/uL (ref 4.0–10.5)
nRBC: 0 % (ref 0.0–0.2)

## 2022-04-20 LAB — CMP (CANCER CENTER ONLY)
ALT: 12 U/L (ref 0–44)
AST: 11 U/L — ABNORMAL LOW (ref 15–41)
Albumin: 3.9 g/dL (ref 3.5–5.0)
Alkaline Phosphatase: 86 U/L (ref 38–126)
Anion gap: 6 (ref 5–15)
BUN: 24 mg/dL — ABNORMAL HIGH (ref 8–23)
CO2: 29 mmol/L (ref 22–32)
Calcium: 9.2 mg/dL (ref 8.9–10.3)
Chloride: 104 mmol/L (ref 98–111)
Creatinine: 1.15 mg/dL — ABNORMAL HIGH (ref 0.44–1.00)
GFR, Estimated: 50 mL/min — ABNORMAL LOW (ref >60)
Glucose, Bld: 124 mg/dL — ABNORMAL HIGH (ref 70–99)
Potassium: 3.6 mmol/L (ref 3.5–5.1)
Sodium: 139 mmol/L (ref 135–145)
Total Bilirubin: 0.8 mg/dL (ref 0.3–1.2)
Total Protein: 6.6 g/dL (ref 6.5–8.1)

## 2022-04-20 MED ORDER — SODIUM CHLORIDE 0.9% FLUSH
10.0000 mL | Freq: Once | INTRAVENOUS | Status: AC
  Administered 2022-04-20: 10 mL

## 2022-04-20 MED ORDER — HEPARIN SOD (PORK) LOCK FLUSH 100 UNIT/ML IV SOLN
500.0000 [IU] | Freq: Once | INTRAVENOUS | Status: AC
  Administered 2022-04-20: 500 [IU]

## 2022-04-20 NOTE — Progress Notes (Signed)
Hematology and Oncology Follow Up   Maria Marquez [id number] 05-01-1946 76 y.o. 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   Secondary diagnoses:  1.  T1aN0 adenocarcinoma of the lung diagnosed in February 2022.    2.  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        Prior Therapy:    She is status post cystoscopy and urethroscopy and a biopsy of her renal pelvic mass completed by Dr. Lovena Neighbours in April 18, 2020.  This showed high-grade urothelial carcinoma.   She is status post bronchoscopy and a biopsy of her right upper lung mass which was not diagnostic.   Gemcitabine and cisplatin chemotherapy started on June 13, 2020.  She completed 3 cycles and cycle 4 was given without cisplatin completed in December 2021.  She is status post bronchoscopy and a biopsy completed by Dr. Roxan Hockey on October 10, 2020 which confirmed the presence of primary lung neoplasm.  SBRT to right lung cancer between March 24 to November 26, 2020.  She received total of 50 Gray and 10 fractions.  She is status post repeat cystoscopy in and biopsy as well as complete laser tumor ablation completed on Dec 31, 2020.  Final pathology showed low-grade urothelial carcinoma.  This without muscle invasive disease.  She is status post right nephrostomy tube and mitomycin gel infusion under the care of Dr. Lovena Neighbours received June July and in August 2023.  The plan is to complete 6 installations.   Current therapy: Active surveillance.  Interim History: [name] returns today for a repeat evaluation.  Since the last visit, she completed 6 instillations of mitomycin gel via nephrostomy tube on the right side.  She tolerated the procedural well but did have pain and discomfort related to the  catheter.  She denies any nausea, vomiting or abdominal pain.  She denies any hospitalizations or illnesses. .    Medications: Updated on review. Current Outpatient Medications  Medication Sig Dispense Refill   albuterol (VENTOLIN HFA) 108 (90 Base) MCG/ACT inhaler INHALE ONE TO TWO PUFFS BY MOUTH INTO THE LUNGS EVERY SIX HOURS AS NEEDED FOR WHEEZING OR SORTNESS OF BREATH 18 g 0   atorvastatin (LIPITOR) 20 MG tablet TAKE 1 TABLET EVERY DAY 90 tablet 1   diphenhydrAMINE (BENADRYL) 50 MG tablet Take 1 tablet (50 mg total) by mouth at bedtime as needed for itching. Take 1 tablet at 7:30 am the morning of the procedure 1 tablet 0   EPINEPHRINE 0.3 mg/0.3 mL IJ SOAJ injection Inject 0.3 mg into the muscle as needed for anaphylaxis 2 each 0   estradiol-norethindrone (ACTIVELLA) 1-0.5 MG tablet TAKE ONE TABLET BY MOUTH ONE TIME DAILY 28 tablet 0   ezetimibe (ZETIA) 10 MG tablet Take 1 tablet (10 mg total) by mouth daily. 90 tablet 1   famotidine (PEPCID) 20 MG tablet Take 1 tablet (20 mg total) by mouth 2 (two) times daily. 60 tablet 2   furosemide (LASIX) 20 MG tablet Take 1 tablet (20 mg total) by mouth daily as needed (leg swelling). 30 tablet 3   HYDROcodone-acetaminophen (NORCO) 5-325 MG tablet Take 1-2 tablets by mouth every 6 (six) hours as needed for moderate pain. 20 tablet 0   levothyroxine (SYNTHROID) 150 MCG tablet TAKE 1 TABLET EVERY DAY 90 tablet 1   lidocaine-prilocaine (EMLA)  cream Apply 1 application. topically as needed. 30 g 0   losartan-hydrochlorothiazide (HYZAAR) 50-12.5 MG tablet TAKE 1 TABLET EVERY DAY 90 tablet 1   oxybutynin (DITROPAN) 5 MG tablet Take 1 tablet (5 mg total) by mouth every 8 (eight) hours as needed for bladder spasms. 30 tablet 1   oxyCODONE-acetaminophen (PERCOCET) 5-325 MG tablet Take 1 tablet by mouth every 4 (four) hours as needed for severe pain. 20 tablet 0   phenazopyridine (PYRIDIUM) 200 MG tablet Take 1 tablet (200 mg total) by mouth 3 (three) times  daily as needed (for pain with urination). 30 tablet 0   predniSONE (DELTASONE) 50 MG tablet Take 1 tablet at 7:30 pm the night before procedure. Take 1 tablet 1:30 am the morning before procedure. Take 1 tablet 7:30 am the morning of procedure. 3 tablet 0   Vitamin D, Ergocalciferol, (DRISDOL) 1.25 MG (50000 UNIT) CAPS capsule Take 1 capsule (50,000 Units total) by mouth every 7 (seven) days for 12 doses. 12 capsule 0   XIIDRA 5 % SOLN Place 1 drop into both eyes 2 (two) times daily as needed (dry eyes).     No current facility-administered medications for this visit.     Allergies:  Allergies  Allergen Reactions   Iodine Shortness Of Breath   Keflex [Cephalexin] Shortness Of Breath, Rash and Tinitus   Shellfish Allergy Hives   Shellfish-Derived Products Anaphylaxis and Hives   Hydrocodone Hives and Itching    Itching throat   Lasix [Furosemide] Itching   Rosuvastatin Other (See Comments)    Muscle Pain in Thighs   Latex Rash   Lisinopril Cough   Sulfa Antibiotics Nausea Only   Tramadol Itching   Physical exam:     Blood pressure (!) 143/68, pulse 73, temperature 97.6 F (36.4 C), temperature source Temporal, resp. rate 18, height 5' 5.5" (1.664 m), weight 279 lb 14.4 oz (127 kg), SpO2 97 %.     ECOG 1   General appearance: Comfortable appearing without any discomfort Head: Normocephalic without any trauma Oropharynx: Mucous membranes are moist and pink without any thrush or ulcers. Eyes: Pupils are equal and round reactive to light. Lymph nodes: No cervical, supraclavicular, inguinal or axillary lymphadenopathy.   Heart:regular rate and rhythm.  S1 and S2 without leg edema. Lung: Clear without any rhonchi or wheezes.  No dullness to percussion. Abdomin: Soft, nontender, nondistended with good bowel sounds.  No hepatosplenomegaly. Musculoskeletal: No joint deformity or effusion.  Full range of motion noted. Neurological: No deficits noted on motor, sensory and deep  tendon reflex exam. Skin: No petechial rash or dryness.  Appeared moist.         Lab Results: Lab Results  Component Value Date   WBC 6.9 03/29/2022   HGB 12.7 03/29/2022   HCT 37.4 03/29/2022   MCV 89.3 03/29/2022   PLT 252.0 03/29/2022     Chemistry      Component Value Date/Time   NA 136 03/29/2022 0807   NA 140 12/19/2019 1132   K 3.7 03/29/2022 0807   CL 105 03/29/2022 0807   CO2 27 03/29/2022 0807   BUN 25 (H) 03/29/2022 0807   BUN 25 12/19/2019 1132   CREATININE 1.57 (H) 03/29/2022 0807   CREATININE 1.45 (H) 10/06/2021 1015   GLU 92 02/04/2016 0000      Component Value Date/Time   CALCIUM 8.9 03/29/2022 0807   ALKPHOS 74 03/29/2022 0807   AST 13 03/29/2022 0807   AST 18 10/06/2021 1015  ALT 12 03/29/2022 0807   ALT 22 10/06/2021 1015   BILITOT 0.8 03/29/2022 0807   BILITOT 0.6 10/06/2021 1015     IMPRESSION: 1. Stable examination without evidence of metastatic disease in the chest, abdomen or pelvis on noncontrast imaging. 2. Stable treatment changes in the right suprahilar region. Stable benign 5 mm right upper lobe nodule. 3. Unremarkable noncontrast appearance of the kidneys, ureters and bladder. 4. Stable incidental findings including cholelithiasis, a probable cyst in the left hepatic lobe, coronary and Aortic Atherosclerosis (ICD10-I70.0).        Impression and Plan:   76 year old woman with   1.    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  She is currently status post local therapy with ablation followed by mitomycin gel under the care of Dr. Lovena Neighbours.  Salvage therapy options including Pembrolizumab will be deferred if she developed relapsed disease.  Risks and benefits of this treatment were discussed at this time.  Complications that include autoimmune complications and GI toxicity as well as success rate associated with refractory nonmuscle invasive upper bladder tumor were discussed.  At this time  she will continue to be on active surveillance and repeat cystoscopy in 6 weeks and pending these results we will determine whether she will continue with active surveillance versus systemic therapy.  2.   Stage IA adenocarcinoma of the lung diagnosed in February 2022.  CT scan obtained on February 18, 2022 did not show any evidence of recurrent disease.  No additional therapy is needed at this time we will continue to monitor with repeat imaging in 6 months.   3.  High-grade urothelial carcinoma of the right renal pelvis: No evidence of recurrent disease at this time after neoadjuvant chemotherapy.   4.  Port-A-Cath management: We will continue to have Port-A-Cath flush every 2 months.   5.  Follow-up: In 3 months for a follow-up visit.  30  minutes were spent on this visit.  The time was dedicated to reviewing laboratory data, imaging studies treatment choices and future plan of care discussion.  Zola Button, MD 04/20/2022 10:05 AM

## 2022-04-21 ENCOUNTER — Ambulatory Visit: Payer: Medicare HMO | Admitting: Internal Medicine

## 2022-04-27 ENCOUNTER — Telehealth: Payer: Self-pay | Admitting: Oncology

## 2022-04-27 NOTE — Telephone Encounter (Signed)
Scheduled per 08/29 los, patient has been called and notified.

## 2022-04-28 ENCOUNTER — Encounter: Payer: Self-pay | Admitting: Internal Medicine

## 2022-04-29 DIAGNOSIS — C651 Malignant neoplasm of right renal pelvis: Secondary | ICD-10-CM | POA: Diagnosis not present

## 2022-05-03 ENCOUNTER — Ambulatory Visit (INDEPENDENT_AMBULATORY_CARE_PROVIDER_SITE_OTHER): Payer: Medicare HMO | Admitting: Internal Medicine

## 2022-05-03 ENCOUNTER — Encounter: Payer: Self-pay | Admitting: Internal Medicine

## 2022-05-03 VITALS — BP 120/78 | HR 64 | Temp 98.0°F | Wt 277.1 lb

## 2022-05-03 DIAGNOSIS — Z23 Encounter for immunization: Secondary | ICD-10-CM | POA: Diagnosis not present

## 2022-05-03 DIAGNOSIS — R739 Hyperglycemia, unspecified: Secondary | ICD-10-CM | POA: Diagnosis not present

## 2022-05-03 LAB — POCT GLUCOSE (DEVICE FOR HOME USE): POC Glucose: 115 mg/dl — AB (ref 70–99)

## 2022-05-03 NOTE — Progress Notes (Signed)
Established Patient Office Visit     CC/Reason for Visit: Concern regarding blood sugar and urine  HPI: Maria Marquez is a 76 y.o. female who is coming in today for the above mentioned reasons.  She has renal cell carcinoma and is having chemo delivered directly into a catheter to her right kidney.  She was also recently diagnosed with diabetes.  Her A1c in August was 7.5.  She came in today because her "urine smells like popcorn".  She believes this might be related to her sugar.  Her CBG today is 115.  She has no other urinary symptoms.  Requesting flu vaccine today.   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[facility])    urologist-- dr winter/  oncologist-- dr Alen Blew;   dx 08/ 2021 ;  chemo 10/ 2021  to 12/ 2021   Chronic kidney disease, stage 3a ([place]) 12/31/2019   Dyspnea on exertion    01-22-2021   per pt no issue most of the time   Family history of breast cancer 05/04/2021   Family history of ovarian cancer 05/04/2021   GERD (gastroesophageal reflux disease)    per pt occasionally takes tums   History of basal cell carcinoma (BCC) excision    per pt in 1970s on nose   History of chemotherapy    06-13-2020  to 12/ 2021  for right renal pelvis carcinoma   History of colon polyps    History of radiation therapy    11-13-2020 to 11-26-2020---   Right Lung- SBRT- Dr. Gery Pray   HLD (hyperlipidemia)    Hypertension    followed by pcp   Hypothyroidism, postsurgical 1979   followed by pcp   Leukocytosis    Nocturia    OA (osteoarthritis)    PONV (postoperative nausea and vomiting)    Since chemo (always nauseousno ponv with surgeries   Pre-diabetes    Primary adenocarcinoma of upper lobe of right lung [facility])    oncologist--- shadad/  pulmonology-- dr byrum;  dx 02/ 2022;   s/p  SBRT 11-13-2020 to 11-26-2020   Urgency of urination    wears pads   Wears partial dentures    lower    Past Surgical History:  Procedure Laterality Date   COLONOSCOPY  last one 2017   CYSTOSCOPY W/ URETERAL STENT PLACEMENT Right 01/27/2022   Procedure: CYSTOSCOPY WITH RETROGRADE PYELOGRAM/ URETEROSCOPY/POSSIBLE BIOPSY/ POSSIBLE  URETERAL STENT PLACEMENT;  Surgeon: Ceasar Mons, MD;  Location: [facility];  Service: Urology;  Laterality: Right;   CYSTOSCOPY WITH BIOPSY Right 09/16/2021   Procedure: CYSTOSCOPY / URETEROSCOPY WITH BIOPSY/bugbee fulgeration/right retrograde;  Surgeon: Ceasar Mons, MD;  Location: [facility];  Service: Urology;  Laterality: Right;   CYSTOSCOPY WITH URETEROSCOPY Right 04/18/2020   Procedure: CYSTOSCOPY WITH URETEROSCOPY, BIOPSY;  Surgeon: Ceasar Mons, MD;  Location: [facility];  Service: Urology;  Laterality: Right;  ONLY NEEDS 45 MIN   CYSTOSCOPY WITH URETEROSCOPY AND STENT PLACEMENT N/A 12/31/2020   Procedure: CYSTOSCOPY WITH RIGHT URETEROSCOPY/ BILATERAL RETROGRADE PYELOGRAM/RIGHT URETERAL BIOPSY/ LASER ABLATION/RIGHT STENT PLACEMENT;  Surgeon: Ceasar Mons, MD;  Location: [facility];  Service: Urology;  Laterality: N/A;   CYSTOSCOPY/RETROGRADE/URETEROSCOPY Right 04/22/2021   Procedure: CYSTOSCOPY/RETROGRADE/URETEROSCOPY/ STENT PLACEMENT;  Surgeon: Ellison Hughs  Marjory Lies, MD;  Location[facility];  Service: Urology;  Laterality: Right;   EYE SURGERY Bilateral 1987   tear ducts   IR IMAGING GUIDED PORT INSERTION  06/04/2020   IR NEPHROSTOMY PLACEMENT RIGHT  03/03/2022   THYROID LOBECTOMY Right 1979   TOTAL HIP ARTHROPLASTY Right 01/28/2016   Procedure: RIGHT TOTAL HIP ARTHROPLASTY ANTERIOR APPROACH;  Surgeon: [name], MD;  Location: [facility];  Service: Orthopedics;  Laterality: Right;   UMBILICAL HERNIA REPAIR  1980s   VIDEO BRONCHOSCOPY WITH  ENDOBRONCHIAL NAVIGATION N/A 06/06/2020   Procedure: VIDEO BRONCHOSCOPY WITH ENDOBRONCHIAL NAVIGATION;  Surgeon: Collene Gobble, MD;  Location: [place];  Service: Thoracic;  Laterality: N/A;   VIDEO BRONCHOSCOPY WITH ENDOBRONCHIAL NAVIGATION N/A 10/10/2020   Procedure: VIDEO BRONCHOSCOPY WITH ENDOBRONCHIAL NAVIGATION;  Surgeon: [name], MD;  Location: [place];  Service: Thoracic;  Laterality: N/A;    Social History:  reports that she quit smoking about 2 years ago. Her smoking use included cigarettes. She has a 45.00 pack-year smoking history. She has never used smokeless tobacco. She reports that she does not currently use alcohol. She reports that she does not use drugs.  Allergies: Allergies  Allergen Reactions   Iodine Shortness Of Breath   Keflex [Cephalexin] Shortness Of Breath, Rash and Tinitus   Shellfish Allergy Hives   Shellfish-Derived Products Anaphylaxis and Hives   Hydrocodone Hives and Itching    Itching throat   Lasix [Furosemide] Itching   Rosuvastatin Other (See Comments)    Muscle Pain in Thighs   Latex Rash   Lisinopril Cough   Sulfa Antibiotics Nausea Only   Tramadol Itching    Family History:  Family History  Problem Relation Age of Onset   Arthritis Mother    Breast cancer Mother 39   Schizophrenia Mother    Diabetes Father    Heart disease Father    Kidney disease Father    Stroke Sister    Lung cancer Brother        dx > 80; work-related exposures   Heart disease Brother    Heart disease Brother    Vision loss Brother        Glaucoma   Alcohol abuse Brother    Lung cancer Maternal Aunt        dx > 48   Colon cancer Paternal Aunt        dx > 20   Head & neck cancer Paternal Uncle        dx 67s   Ovarian cancer Daughter 78   Breast cancer Niece 26   Leukemia Cousin        d. 41s     Current Outpatient Medications:    albuterol (VENTOLIN HFA) 108 (90 Base) MCG/ACT inhaler, INHALE ONE TO TWO PUFFS BY MOUTH INTO THE LUNGS EVERY  SIX HOURS AS NEEDED FOR WHEEZING OR SORTNESS OF BREATH, Disp: 18 g, Rfl: 0   atorvastatin (LIPITOR) 20 MG tablet, TAKE 1 TABLET EVERY DAY, Disp: 90 tablet, Rfl: 1   EPINEPHRINE 0.3 mg/0.3 mL IJ SOAJ injection, Inject 0.3 mg into the muscle as needed for anaphylaxis, Disp: 2 each, Rfl: 0   estradiol-norethindrone (ACTIVELLA) 1-0.5 MG tablet, TAKE ONE TABLET BY MOUTH ONE TIME DAILY, Disp: 28 tablet, Rfl: 0   ezetimibe (ZETIA) 10 MG tablet, Take 1 tablet (10 mg total) by mouth daily., Disp: 90 tablet, Rfl: 1   famotidine (PEPCID) 20 MG tablet, Take 1 tablet (20 mg total) by mouth 2 (  two) times daily., Disp: 60 tablet, Rfl: 2   furosemide (LASIX) 20 MG tablet, Take 1 tablet (20 mg total) by mouth daily as needed (leg swelling)., Disp: 30 tablet, Rfl: 3   levothyroxine (SYNTHROID) 150 MCG tablet, TAKE 1 TABLET EVERY DAY, Disp: 90 tablet, Rfl: 1   lidocaine-prilocaine (EMLA) cream, Apply 1 application. topically as needed., Disp: 30 g, Rfl: 0   losartan-hydrochlorothiazide (HYZAAR) 50-12.5 MG tablet, TAKE 1 TABLET EVERY DAY, Disp: 90 tablet, Rfl: 1   Vitamin D, Ergocalciferol, (DRISDOL) 1.25 MG (50000 UNIT) CAPS capsule, Take 1 capsule (50,000 Units total) by mouth every 7 (seven) days for 12 doses., Disp: 12 capsule, Rfl: 0   XIIDRA 5 % SOLN, Place 1 drop into both eyes 2 (two) times daily as needed (dry eyes)., Disp: , Rfl:   Review of Systems:  Constitutional: Denies fever, chills, diaphoresis, appetite change and fatigue.  HEENT: Denies photophobia, eye pain, redness, hearing loss, ear pain, congestion, sore throat, rhinorrhea, sneezing, mouth sores, trouble swallowing, neck pain, neck stiffness and tinnitus.   Respiratory: Denies SOB, DOE, cough, chest tightness,  and wheezing.   Cardiovascular: Denies chest pain, palpitations and leg swelling.  Gastrointestinal: Denies nausea, vomiting, abdominal pain, diarrhea, constipation, blood in stool and abdominal distention.  Genitourinary: Denies dysuria,  urgency, frequency, hematuria, flank pain and difficulty urinating.  Endocrine: Denies: hot or cold intolerance, sweats, changes in hair or nails, polyuria, polydipsia. Musculoskeletal: Denies myalgias, back pain, joint swelling, arthralgias and gait problem.  Skin: Denies pallor, rash and wound.  Neurological: Denies dizziness, seizures, syncope, weakness, light-headedness, numbness and headaches.  Hematological: Denies adenopathy. Easy bruising, personal or family bleeding history  Psychiatric/Behavioral: Denies suicidal ideation, mood changes, confusion, nervousness, sleep disturbance and agitation    Physical Exam: Vitals:   05/03/22 1447  BP: 120/78  Pulse: 64  Temp: 98 F (36.7 C)  TempSrc: Oral  SpO2: 98%  Weight: 277 lb 1.6 oz (125.7 kg)    Body mass index is 45.41 kg/m.   Constitutional: NAD, calm, comfortable Eyes: PERRL, lids and conjunctivae normal ENMT: Mucous membranes are moist.  Psychiatric: Normal judgment and insight. Alert and oriented x 3. Normal mood.    Impression and Plan:  Hyperglycemia - Plan: POCT Glucose (Device for Home Use) -Urine is clear, CBG is 115.  Freestyle libre provided.   Time spent:20 minutes reviewing chart, interviewing and examining patient and formulating plan of care.     [name], MD [facility]

## 2022-05-03 NOTE — Addendum Note (Signed)
Addended by: Westley Hummer B on: 05/03/2022 04:17 PM   Modules accepted: Orders

## 2022-05-04 ENCOUNTER — Encounter: Payer: Self-pay | Admitting: Internal Medicine

## 2022-05-04 MED ORDER — FREESTYLE LIBRE 3 SENSOR MISC: 3 refills | Status: DC

## 2022-05-14 ENCOUNTER — Other Ambulatory Visit: Payer: Self-pay | Admitting: Family Medicine

## 2022-05-14 DIAGNOSIS — K219 Gastro-esophageal reflux disease without esophagitis: Secondary | ICD-10-CM

## 2022-05-17 ENCOUNTER — Telehealth: Payer: Self-pay | Admitting: Pharmacist

## 2022-05-17 NOTE — Chronic Care Management (AMB) (Signed)
    Chronic Care Management Pharmacy Assistant   Name: Maria Marquez  MRN: [id number] DOB: 06-28-1946  05/18/2022 APPOINTMENT REMINDER  Called Maria Marquez, No answer, left message of appointment on 05/18/2022 at 12:00 via telephone visit with Maria Marquez, Pharm D. Notified to have all medications, supplements, blood pressure and/or blood sugar logs available during appointment and to return call if need to reschedule.  Care Gaps: AWV - 11/09/2021 message to Maria Marquez Last BP - 120/78 on 05/03/2022 Last A1C - 7.5 on 03/29/2022 Foot exam - never done Urine ACR - overdue Colonoscopy - postponed Covid booster - postponed Mammogram - postponed  Star Rating Drug: Atorvastatin 20 mg - last filled 04/07/2022 90 DS at [facility] Losartan HCTZ 50/12.5 mg - last filled 04/07/2022 90 DS at [facility]  Any gaps in medications fill history? No  Maria Marquez [facility]  Catering manager [phone]

## 2022-05-18 ENCOUNTER — Other Ambulatory Visit: Payer: Self-pay | Admitting: Urology

## 2022-05-18 ENCOUNTER — Telehealth: Payer: Medicare HMO

## 2022-05-18 NOTE — Progress Notes (Deleted)
Chronic Care Management Pharmacy Note  05/18/2022 Name:  Maria Marquez MRN:  [id number] DOB:  1946-01-13  Summary: LDL not at goal < 100 TSH not at goal  Recommendations/Changes made from today's visit: -Recommended bringing BP cuff to next office visit to ensure accuracy -Recommended Kegel exercises and discussing Myrbetriq or Gemtesa with urologist -Recommend stopping aspirin due to primary prevention -Recommended trial of Pepcid PRN for acid reflux symptoms  Plan: Scheduled last visit for repeat TSH Follow up BP assessment in 2-3 months   Subjective: Maria Marquez is an 75 y.o. year old female who is a primary patient of Isaac Marquez, Rayford Halsted, MD.  The CCM team was consulted for assistance with disease management and care coordination needs.    Engaged with patient by telephone for follow up visit in response to provider referral for pharmacy case management and/or care coordination services.   Consent to Services:  The patient was given information about Chronic Care Management services, agreed to services, and gave verbal consent prior to initiation of services.  Please see initial visit note for detailed documentation.   Patient Care Team: Isaac Marquez, Rayford Halsted, MD as PCP - General (Internal Medicine) [name], MD as Consulting Physician (Gastroenterology) [name] as Consulting Physician (Surgery) [name], MD as Consulting Physician (Orthopedic Surgery) [name], MD as Referring Physician (Allergy) [name], MD as Consulting Physician (Ophthalmology) [name], PsyD as Counselor (Psychology) Bjorn Loser, MD as Consulting Physician (Urology) [name], [facility] as Pharmacist (Pharmacist)  Recent office visits: 05/03/2022 Maria Frohlich MD: Patient presented for hyperglycemia. Ordered freestyle libre 3.  03/17/22 Maria [name] MD: Patient presented for annual exam. Prescribed vitamin  D 50,000 units weekly x 12 weeks.   03/02/2022 Maria Frohlich MD - Patient was seen for Bilateral lower extremity edema and additional concerns. Discontinued Amiloride and started Furosemide 20 mg daily as needed.   02/16/22 Maria Martinique, MD: Patient presented for edema and diarrhea. Discontinued pantoprazole and prescribed Pepcid 20 mg BID.  02/04/22 Maria [name] MD: Patient presented for GERD. Prescribed pantoprazole. If no improvement in 8-12 weeks then refer to GI.  Recent consult visits: 04/20/2022 Maria Portela MD (oncology) - Patient was seen for Cancer of renal pelvis follow up.  Continue with active surveillance. Follow up in 3 months.  02/15/2022 Maria Portela MD (oncology) - Patient was seen for Cancer of renal pelvis follow up. Follow up in 6-8 weeks.  12/10/2021 Maria Marquez PT - Patient was seen for Difficulty in walking, not elsewhere classified and additional issues. No medication changes. Follow up plan 2 times weekly for 8 weeks.   11/19/2021 Maria Marquez - Patient was seen for S/P right THA. No medication changes. No follow up noted.     Hospital visits: Patient was seen at [facility] ED on 02/15/2022 ( 3 hours) due to peripheral edema and shortness of breath.    New?Medications Started at [facility] Discharge:?? aMILoride 5 MG take 1 tablet daily Medication Changes at Hospital Discharge: No medication changes  Medications Discontinued at Hospital Discharge: No medications discontinued Medications that remain the same after Hospital Discharge:??  -All other medications will remain the same.     Admitted to [facility] on 01/27/2022 (5 hrs) due to cytoscopy with retrograde pyelogram, ureteroscopy, possible biopsy, possible ureteral stent placement.    New?Medications Started at [facility] Discharge:?? ciprofloxacin (Cipro) oxybutynin (DITROPAN) oxyCODONE-acetaminophen (Percocet) phenazopyridine  (Pyridium) Medication Changes at [facility]  Discharge: No medication changes.  Medications Discontinued at Hospital Discharge: vitamin B-12 1000 MCG tablet (CYANOCOBALAMIN) Medications that remain the same after Hospital Discharge:??  -All other medications will remain the same.     Objective:  Lab Results  Component Value Date   CREATININE 1.15 (H) 04/20/2022   BUN 24 (H) 04/20/2022   GFR 32.38 (L) 03/29/2022   GFRNONAA 50 (L) 04/20/2022   GFRAA 51 (L) 04/15/2020   NA 139 04/20/2022   K 3.6 04/20/2022   CALCIUM 9.2 04/20/2022   CO2 29 04/20/2022   GLUCOSE 124 (H) 04/20/2022    Lab Results  Component Value Date/Time   HGBA1C 7.5 (H) 03/29/2022 08:07 AM   HGBA1C 6.2 03/19/2021 03:14 PM   HGBA1C 6.1 09/23/2015 12:00 AM   GFR 32.38 (L) 03/29/2022 08:07 AM   GFR 33.16 (L) 02/24/2022 01:12 PM    Last diabetic Eye exam:  Lab Results  Component Value Date/Time   HMDIABEYEEXA No Retinopathy 01/06/2022 12:00 AM    Last diabetic Foot exam: No results found for: "HMDIABFOOTEX"   Lab Results  Component Value Date   CHOL 156 03/17/2022   HDL 47.40 03/17/2022   LDLCALC 74 03/17/2022   LDLDIRECT 165.0 03/19/2021   TRIG 173.0 (H) 03/17/2022   CHOLHDL 3 03/17/2022       Latest Ref Rng & Units 04/20/2022    9:54 AM 03/29/2022    8:07 AM 02/15/2022   11:19 AM  Hepatic Function  Total Protein 6.5 - 8.1 g/dL 6.6  6.7  6.8   Albumin 3.5 - 5.0 g/dL 3.9  3.9  3.6   AST 15 - 41 U/L _0 ALT 0 - 44 U/L _1 Alk Phosphatase 38 - 126 U/L 86  74  84   Total Bilirubin 0.3 - 1.2 mg/dL 0.8  0.8  0.8     Lab Results  Component Value Date/Time   TSH 2.94 03/29/2022 08:07 AM   TSH 2.60 11/19/2021 02:21 PM   FREET4 1.45 11/01/2018 01:06 PM   FREET4 1.83 (H) 06/13/2018 08:43 AM       Latest Ref Rng & Units 04/20/2022    9:54 AM 03/29/2022    8:07 AM 03/03/2022    7:58 AM  CBC  WBC 4.0 - 10.5 K/uL 7.2  6.9  8.9   Hemoglobin 12.0 - 15.0 g/dL 11.9  12.7  12.5    Hematocrit 36.0 - 46.0 % 35.5  37.4  37.9   Platelets 150 - 400 K/uL 198  252.0  227     Lab Results  Component Value Date/Time   VD25OH 22.00 (L) 03/17/2022 09:25 AM   VD25OH 30.56 03/19/2021 03:14 PM    Clinical ASCVD: No  The 10-year ASCVD risk score (Arnett DK, et al., 2019) is: 29.4%   Values used to calculate the score:     Age: 72 years     Sex: Female     Is Non-Hispanic African American: No     Diabetic: Yes     Tobacco smoker: No     Systolic Blood Pressure: 568 mmHg     Is BP treated: Yes     HDL Cholesterol: 47.4 mg/dL     Total Cholesterol: 156 mg/dL       02/16/2022    3:29 PM 09/22/2021    1:39 PM 03/19/2021    2:35 PM  Depression screen PHQ 2/9  Decreased Interest 3 0 0  Down,  Depressed, Hopeless 3 0 1  PHQ - 2 Score 6 0 1  Altered sleeping 3 2 0  Tired, decreased energy _0 Change in appetite _1 Feeling bad or failure about yourself  3 0 0  Trouble concentrating 3 0 1  Moving slowly or fidgety/restless 3 0 0  Suicidal thoughts 3 0 0  PHQ-9 Score _2 Difficult doing work/chores Somewhat difficult Somewhat difficult Not difficult at all      Social History   Tobacco Use  Smoking Status Former   Packs/day: 1.00   Years: 45.00   Total pack years: 45.00   Types: Cigarettes   Quit date: 04/18/2020   Years since quitting: 2.0  Smokeless Tobacco Never   BP Readings from Last 3 Encounters:  05/03/22 120/78  04/20/22 (!) 143/68  03/17/22 120/68   Pulse Readings from Last 3 Encounters:  05/03/22 64  04/20/22 73  03/17/22 80   Wt Readings from Last 3 Encounters:  05/03/22 277 lb 1.6 oz (125.7 kg)  04/20/22 279 lb 14.4 oz (127 kg)  03/17/22 279 lb 9.6 oz (126.8 kg)   BMI Readings from Last 3 Encounters:  05/03/22 45.41 kg/m  04/20/22 45.87 kg/m  03/17/22 45.82 kg/m    Assessment/Interventions: Review of patient past medical history, allergies, medications, health status, including review of consultants reports, laboratory  and other test data, was performed as part of comprehensive evaluation and provision of chronic care management services.   SDOH:  (Social Determinants of Health) assessments and interventions performed: Yes SDOH Interventions    Flowsheet Row Chronic Care Management from 11/16/2021 in [place] at [place] from 09/22/2021 in [place] at [place]  SDOH Interventions    Transportation Interventions Intervention Not Indicated --  Depression Interventions/Treatment  -- Medication  Financial Strain Interventions Intervention Not Indicated --      SDOH Screenings   Food Insecurity: No Food Insecurity (09/24/2021)  Housing: Low Risk  (09/24/2021)  Transportation Needs: No Transportation Needs (11/16/2021)  Depression (PHQ2-9): High Risk (02/16/2022)  Financial Resource Strain: Low Risk  (11/16/2021)  Physical Activity: Unknown (09/24/2021)  Social Connections: Socially Isolated (09/24/2021)  Stress: No Stress Concern Present (09/24/2021)  Tobacco Use: Medium Risk (05/03/2022)   Patient isn't doing a whole lot right now. She used to go to the [facility] a lot more but doesn't have a lot of energy. Patient isn't able to stand for more than 5 to 10 minutes per day due to her back pain either.  Patient has a 2000 square foot home and it is too big. She is doing the shopping, laundry and watches a lot of TV and enjoys reading as well. Patient has been doing therapy for 2 months and is going to have an evaluation today for further continuation. Patient has noticed some improvement with her posture but it's brief respite and not much over all. She has been doing exercises at home for the days that she is not going and also sometimes uses a recumbent bike.  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 She took care of him alone for 4 years and she was up every 2-3 hours with him. She had to lift him and put him in a  wheelchair.  Patient has been sleeping a little bit deeper with the PT but she doesn't sleep longer. She falls asleep quickly but just wakes up  every 2-3 hours. She is now also getting up to urinate every 2-3 hours. Patient has tried several sleep medications. Patient doesn't want to have to take something every night.  Patient denies any issues with current medications. Patient reports 1/3 of the medications she is no longer taking. Patient reports she knows what her medications are for. Patient loads up the pillbox once a week and misses doses maybe once or twice a month and this is usually her Synthroid which she takes prior to bedtime. She skips with a midnight snack.  Patient has been getting nosebleeds recently and wasn't sure why.   CCM Care Plan  Allergies  Allergen Reactions   Iodine Shortness Of Breath   Keflex [Cephalexin] Shortness Of Breath, Rash and Tinitus   Shellfish Allergy Hives   Shellfish-Derived Products Anaphylaxis and Hives   Hydrocodone Hives and Itching    Itching throat   Lasix [Furosemide] Itching   Rosuvastatin Other (See Comments)    Muscle Pain in Thighs   Latex Rash   Lisinopril Cough   Sulfa Antibiotics Nausea Only   Tramadol Itching    Medications Reviewed Today     Reviewed by Isaac Marquez, Rayford Halsted, MD (Physician) on 05/03/22 at 1521  Med List Status: <None>   Medication Order Taking? Sig Documenting Provider Last Dose Status Informant  albuterol (VENTOLIN HFA) 108 (90 Base) MCG/ACT inhaler [id number] Yes INHALE ONE TO TWO PUFFS BY MOUTH INTO THE LUNGS EVERY SIX HOURS AS NEEDED FOR WHEEZING OR SORTNESS OF BREATH Isaac Marquez, Rayford Halsted, MD Taking Active   atorvastatin (LIPITOR) 20 MG tablet [id number] Yes TAKE 1 TABLET EVERY DAY Isaac Marquez, Rayford Halsted, MD Taking Active     Discontinued 05/03/22 1446 (Completed Course)   EPINEPHRINE 0.3 mg/0.3 mL IJ SOAJ injection [id number] Yes Inject 0.3 mg into the muscle as needed for anaphylaxis  Isaac Marquez, Rayford Halsted, MD Taking Active Self  estradiol-norethindrone (ACTIVELLA) 1-0.5 MG tablet [id number] Yes TAKE ONE TABLET BY MOUTH ONE TIME DAILY Isaac Marquez, Rayford Halsted, MD Taking Active   ezetimibe (ZETIA) 10 MG tablet [id number] Yes Take 1 tablet (10 mg total) by mouth daily. Isaac Marquez, Rayford Halsted, MD Taking Active   famotidine (PEPCID) 20 MG tablet [id number] Yes Take 1 tablet (20 mg total) by mouth 2 (two) times daily. Marquez, Maria G, MD Taking Active   furosemide (LASIX) 20 MG tablet [id number] Yes Take 1 tablet (20 mg total) by mouth daily as needed (leg swelling). Isaac Marquez, Rayford Halsted, MD Taking Active     Discontinued 05/03/22 1446   levothyroxine (SYNTHROID) 150 MCG tablet [id number] Yes TAKE 1 TABLET EVERY DAY Isaac Marquez, Rayford Halsted, MD Taking Active   lidocaine-prilocaine (EMLA) cream [id number] Yes Apply 1 application. topically as needed. Maria Portela, MD Taking Active Self  losartan-hydrochlorothiazide [facility]) 50-12.5 MG tablet [id number] Yes TAKE 1 TABLET EVERY DAY Isaac Marquez, Rayford Halsted, MD Taking Active     Discontinued 05/03/22 1447 (Completed Course)     Discontinued 05/03/22 1447 (Completed Course)     Discontinued 05/03/22 1447 (Completed Course)     Discontinued 05/03/22 1446   Vitamin D, Ergocalciferol, (DRISDOL) 1.25 MG (50000 UNIT) CAPS capsule [id number] Yes Take 1 capsule (50,000 Units total) by mouth every 7 (seven) days for 12 doses. Isaac Marquez, Rayford Halsted, MD Taking Active   XIIDRA 5 % Bailey Mech [id number] Yes Place 1 drop into both eyes 2 (two) times daily as needed (dry eyes). [provider] Taking Active Self  Patient Active Problem List   Diagnosis Date Noted   DM (diabetes mellitus), type 2 ([place]) 03/29/2022   Genetic testing 06/02/2021   Family history of breast cancer 05/04/2021   Family history of ovarian cancer 05/04/2021   Family history of colon cancer 05/04/2021   Vitamin D deficiency 03/20/2021    Primary cancer of right upper lobe of lung ([place]) 10/21/2020   Port-A-Cath in place 06/13/2020   Pulmonary nodule 06/03/2020   Cancer of renal pelvis, unspecified laterality ([place]) 05/26/2020   Chronic kidney disease, stage 3a ([place]) 12/31/2019   Prediabetes 12/31/2019   Obesity (BMI 30-39.9) 12/31/2019   Major depression, recurrent, chronic (H[facility]) 10/24/2019   Leg edema 10/24/2019   Medication intolerance 10/24/2019   Benign microscopic hematuria 09/17/2019   Myalgia due to statin 82/42/3536   Umbilical hernia 14/43/1540   Sessile colonic polyp 10/04/2017   Status post right hip replacement 07/21/2015   ACE inhibitor intolerance 06/17/2015   DJD (degenerative joint disease), lumbar 06/17/2015   Essential hypertension 06/17/2015   Mixed hyperlipidemia 06/17/2015   Morbid obesity ([place]) 06/17/2015   Post-menopause on HRT (hormone replacement therapy) 06/17/2015   Postoperative hypothyroidism 06/17/2015   Primary osteoarthritis involving multiple joints 06/17/2015    Immunization History  Administered Date(s) Administered   Fluad Quad(high Dose 65+) 04/28/2016, 06/14/2019, 07/11/2020, 05/03/2022   Influenza, High Dose Seasonal PF 04/28/2016, 07/19/2017, 05/19/2018, 05/15/2021   Influenza, Seasonal, Injecte, Preservative Fre 04/25/2015   Influenza-Unspecified 04/25/2015, 04/28/2016, 07/12/2017, 05/25/2021   Janssen (J&J) SARS-COV-2 Vaccination 04/22/2020   Moderna Sars-Covid-2 Vaccination 05/15/2021   PFIZER(Purple Top)SARS-COV-2 Vaccination 07/03/2020, 01/13/2021   Pneumococcal Conjugate-13 06/13/2015   Pneumococcal Polysaccharide-23 06/10/2016   Pneumococcal-Unspecified 08/27/2019   Tdap 06/16/2014   Zoster Recombinat (Shingrix) 01/08/2020, 05/13/2020   Zoster, Live 05/27/2014   -did she get CGM?  -repeat vitamin D level?  -Recommended bringing BP cuff to next office visit to ensure accuracy -Recommended Kegel exercises and discussing Myrbetriq or Gemtesa with  urologist -Recommend stopping aspirin due to primary prevention -Recommended trial of Pepcid PRN for acid reflux symptoms  -acid reflux better with Pepcid?  Conditions to be addressed/monitored:  Hypertension, Hyperlipidemia, Chronic Kidney Disease, Hypothyroidism, Osteoarthritis, and Overactive Bladder  Conditions addressed this visit: ***  There are no care plans that you recently modified to display for this patient.    Medication Assistance: None required.  Patient affirms current coverage meets needs.  Compliance/Adherence/Medication fill history: Care Gaps: COVID booster, colonoscopy, mammogram, foot exam, urine ACR Last BP - 120/78 on 05/03/2022 Last A1C - 7.5 on 03/29/2022  Star-Rating Drugs: Atorvastatin 20 mg - last filled 04/07/2022 90 DS at [place] HCTZ 50/12.5 mg - last filled 04/07/2022 90 DS at [facility]  Patient's preferred pharmacy is:  [facility], [place] [address] Phone: [phone] Fax: [phone]  [facility] [id number], [place] [address] Phone: [phone] Fax: [phone]  [id number] [address] [place] [address] Phone: [phone] Fax: [phone]  Uses pill box? Yes - weekly Pt endorses 99% compliance  We discussed: Current pharmacy is preferred with insurance plan and patient is satisfied with pharmacy services Patient decided to: Continue current medication management strategy  Care Plan and Follow Up Patient Decision:  Patient agrees to Care Plan and Follow-up.  Plan: The care management team will reach out to the patient again  over the next 7 days.  Jeni Salles, PharmD, [place] Pharmacist [place] [facility] [place]

## 2022-05-19 ENCOUNTER — Encounter: Payer: Self-pay | Admitting: Internal Medicine

## 2022-05-19 DIAGNOSIS — Z6841 Body Mass Index (BMI) 40.0 and over, adult: Secondary | ICD-10-CM | POA: Diagnosis not present

## 2022-05-19 DIAGNOSIS — Z01419 Encounter for gynecological examination (general) (routine) without abnormal findings: Secondary | ICD-10-CM | POA: Diagnosis not present

## 2022-05-19 DIAGNOSIS — Z1231 Encounter for screening mammogram for malignant neoplasm of breast: Secondary | ICD-10-CM | POA: Diagnosis not present

## 2022-05-21 ENCOUNTER — Ambulatory Visit: Payer: Medicare HMO | Admitting: Pharmacist

## 2022-05-21 DIAGNOSIS — E782 Mixed hyperlipidemia: Secondary | ICD-10-CM

## 2022-05-21 DIAGNOSIS — R7303 Prediabetes: Secondary | ICD-10-CM

## 2022-05-21 DIAGNOSIS — I10 Essential (primary) hypertension: Secondary | ICD-10-CM

## 2022-05-21 NOTE — Patient Instructions (Signed)
Hi Perl,  It was great to catch up with you again!  Don't forget to try to add some kegel exercises to your routine when you can! I think you might notice a big difference in leakage even over the next few weeks if you keep up with it regularly.  Please reach out to me if you have any questions or need anything before our follow up!  Best, Maddie  Jeni Salles, PharmD, [place] Pharmacist [place] [facility] [place]

## 2022-05-21 NOTE — Progress Notes (Signed)
Chronic Care Management Pharmacy Note  05/21/2022 Name:  Maria Marquez MRN:  [id number] DOB:  27-Jun-1946  Summary: Pt is still having urinary urgency Pt is having swelling and inquired about taking furosemide regularly  Recommendations/Changes made from today's visit: -Recommended Kegel exercises and discussing Myrbetriq or Gemtesa with urologist -Recommend repeat vitamin D level with PCP office visit -Recommended repeat BMP in the next week if patient needs furosemide regularly  Plan: Follow up BP assessment in 2 months Follow up in 4 months   Subjective: Maria Marquez is an 76 y.o. year old female who is a primary patient of [name], Rayford Halsted, MD.  The CCM team was consulted for assistance with disease management and care coordination needs.    Engaged with patient by telephone for follow up visit in response to provider referral for pharmacy case management and/or care coordination services.   Consent to Services:  The patient was given information about Chronic Care Management services, agreed to services, and gave verbal consent prior to initiation of services.  Please see initial visit note for detailed documentation.   Patient Care Team: [name], Rayford Halsted, MD as PCP - General (Internal Medicine) [name], MD as Consulting Physician (Gastroenterology) [name] as Consulting Physician (Surgery) [name], MD as Consulting Physician (Orthopedic Surgery) [name], MD as Referring Physician (Allergy) [name], MD as Consulting Physician (Ophthalmology) [name], PsyD as Counselor (Psychology) Bjorn Loser, MD as Consulting Physician (Urology) [name], [facility] as Pharmacist (Pharmacist)  Recent office visits: 05/03/2022 [name] MD: Patient presented for hyperglycemia. Ordered freestyle libre 3.  03/17/22 [name] MD: Patient presented for annual exam. Prescribed  vitamin D 50,000 units weekly x 12 weeks.   03/02/2022 [name] MD - Patient was seen for Bilateral lower extremity edema and additional concerns. Discontinued Amiloride and started Furosemide 20 mg daily as needed.   02/16/22 [name], MD: Patient presented for edema and diarrhea. Discontinued pantoprazole and prescribed Pepcid 20 mg BID.  02/04/22 [name] MD: Patient presented for GERD. Prescribed pantoprazole. If no improvement in 8-12 weeks then refer to GI.  Recent consult visits: 04/20/2022 [name] MD (oncology) - Patient was seen for Cancer of renal pelvis follow up.  Continue with active surveillance. Follow up in 3 months.  02/15/2022 [name] MD (oncology) - Patient was seen for Cancer of renal pelvis follow up. Follow up in 6-8 weeks.  12/10/2021 [name] - Patient was seen for Difficulty in walking, not elsewhere classified and additional issues. No medication changes. Follow up plan 2 times weekly for 8 weeks.   11/19/2021 [name] - Patient was seen for S/P right THA. No medication changes. No follow up noted.     Hospital visits: Patient was seen at [name] on 02/15/2022 ( 3 hours) due to peripheral edema and shortness of breath.    New?Medications Started at [facility] Discharge:?? aMILoride 5 MG take 1 tablet daily Medication Changes at Hospital Discharge: No medication changes  Medications Discontinued at Hospital Discharge: No medications discontinued Medications that remain the same after Hospital Discharge:??  -All other medications will remain the same.     Admitted to [name] on 01/27/2022 (5 hrs) due to cytoscopy with retrograde pyelogram, ureteroscopy, possible biopsy, possible ureteral stent placement.    New?Medications Started at [facility] Discharge:?? ciprofloxacin (Cipro) oxybutynin (DITROPAN) oxyCODONE-acetaminophen (Percocet) phenazopyridine  (Pyridium) Medication Changes at Hospital Discharge:  No medication changes.  Medications Discontinued at Hospital Discharge: vitamin B-12 1000 MCG tablet (CYANOCOBALAMIN) Medications that remain the same after Hospital Discharge:??  -All other medications will remain the same.     Objective:  Lab Results  Component Value Date   CREATININE 1.15 (H) 04/20/2022   BUN 24 (H) 04/20/2022   GFR 32.38 (L) 03/29/2022   GFRNONAA 50 (L) 04/20/2022   GFRAA 51 (L) 04/15/2020   NA 139 04/20/2022   K 3.6 04/20/2022   CALCIUM 9.2 04/20/2022   CO2 29 04/20/2022   GLUCOSE 124 (H) 04/20/2022    Lab Results  Component Value Date/Time   HGBA1C 7.5 (H) 03/29/2022 08:07 AM   HGBA1C 6.2 03/19/2021 03:14 PM   HGBA1C 6.1 09/23/2015 12:00 AM   GFR 32.38 (L) 03/29/2022 08:07 AM   GFR 33.16 (L) 02/24/2022 01:12 PM    Last diabetic Eye exam:  Lab Results  Component Value Date/Time   HMDIABEYEEXA No Retinopathy 01/06/2022 12:00 AM    Last diabetic Foot exam: No results found for: "HMDIABFOOTEX"   Lab Results  Component Value Date   CHOL 156 03/17/2022   HDL 47.40 03/17/2022   LDLCALC 74 03/17/2022   LDLDIRECT 165.0 03/19/2021   TRIG 173.0 (H) 03/17/2022   CHOLHDL 3 03/17/2022       Latest Ref Rng & Units 04/20/2022    9:54 AM 03/29/2022    8:07 AM 02/15/2022   11:19 AM  Hepatic Function  Total Protein 6.5 - 8.1 g/dL 6.6  6.7  6.8   Albumin 3.5 - 5.0 g/dL 3.9  3.9  3.6   AST 15 - 41 U/L 11  13  17    ALT 0 - 44 U/L 12  12  17    Alk Phosphatase 38 - 126 U/L 86  74  84   Total Bilirubin 0.3 - 1.2 mg/dL 0.8  0.8  0.8     Lab Results  Component Value Date/Time   TSH 2.94 03/29/2022 08:07 AM   TSH 2.60 11/19/2021 02:21 PM   FREET4 1.45 11/01/2018 01:06 PM   FREET4 1.83 (H) 06/13/2018 08:43 AM       Latest Ref Rng & Units 04/20/2022    9:54 AM 03/29/2022    8:07 AM 03/03/2022    7:58 AM  CBC  WBC 4.0 - 10.5 K/uL 7.2  6.9  8.9   Hemoglobin 12.0 - 15.0 g/dL 11.9  12.7  12.5    Hematocrit 36.0 - 46.0 % 35.5  37.4  37.9   Platelets 150 - 400 K/uL 198  252.0  227     Lab Results  Component Value Date/Time   VD25OH 22.00 (L) 03/17/2022 09:25 AM   VD25OH 30.56 03/19/2021 03:14 PM    Clinical ASCVD: No  The 10-year ASCVD risk score (Arnett DK, et al., 2019) is: 29.4%   Values used to calculate the score:     Age: 47 years     Sex: Female     Is Non-Hispanic African American: No     Diabetic: Yes     Tobacco smoker: No     Systolic Blood Pressure: 009 mmHg     Is BP treated: Yes     HDL Cholesterol: 47.4 mg/dL     Total Cholesterol: 156 mg/dL       02/16/2022    3:29 PM 09/22/2021    1:39 PM 03/19/2021    2:35 PM  Depression screen PHQ 2/9  Decreased Interest 3 0 0  Down, Depressed,  Hopeless 3 0 1  PHQ - 2 Score 6 0 1  Altered sleeping 3 2 0  Tired, decreased energy 2 2 1   Change in appetite 3 2 1   Feeling bad or failure about yourself  3 0 0  Trouble concentrating 3 0 1  Moving slowly or fidgety/restless 3 0 0  Suicidal thoughts 3 0 0  PHQ-9 Score 26 6 4   Difficult doing work/chores Somewhat difficult Somewhat difficult Not difficult at all      Social History   Tobacco Use  Smoking Status Former   Packs/day: 1.00   Years: 45.00   Total pack years: 45.00   Types: Cigarettes   Quit date: 04/18/2020   Years since quitting: 2.0  Smokeless Tobacco Never   BP Readings from Last 3 Encounters:  05/03/22 120/78  04/20/22 (!) 143/68  03/17/22 120/68   Pulse Readings from Last 3 Encounters:  05/03/22 64  04/20/22 73  03/17/22 80   Wt Readings from Last 3 Encounters:  05/03/22 277 lb 1.6 oz (125.7 kg)  04/20/22 279 lb 14.4 oz (127 kg)  03/17/22 279 lb 9.6 oz (126.8 kg)   BMI Readings from Last 3 Encounters:  05/03/22 45.41 kg/m  04/20/22 45.87 kg/m  03/17/22 45.82 kg/m    Assessment/Interventions: Review of patient past medical history, allergies, medications, health status, including review of consultants reports, laboratory  and other test data, was performed as part of comprehensive evaluation and provision of chronic care management services.   SDOH:  (Social Determinants of Health) assessments and interventions performed: Yes (last 11/16/21) SDOH Interventions    Flowsheet Row Chronic Care Management from 11/16/2021 in [place] [facility] [place] from 09/22/2021 in [place] at [place] Interventions    Transportation Interventions Intervention Not Indicated --  Depression Interventions/Treatment  -- Medication  Financial Strain Interventions Intervention Not Indicated --      [place]: No Food Insecurity (09/24/2021)  Housing: Low Risk  (09/24/2021)  Transportation Needs: No Transportation Needs (11/16/2021)  Depression (PHQ2-9): High Risk (02/16/2022)  Financial Resource Strain: Low Risk  (11/16/2021)  Physical Activity: Unknown (09/24/2021)  Social Connections: Socially Isolated (09/24/2021)  Stress: No Stress Concern Present (09/24/2021)  Tobacco Use: Medium Risk (05/03/2022)   [place]  Allergies  Allergen Reactions   Iodine Shortness Of Breath   Keflex [Cephalexin] Shortness Of Breath, Rash and Tinitus   Shellfish Allergy Hives   Shellfish-Derived Products Anaphylaxis and Hives   Hydrocodone Hives and Itching    Itching throat   Lasix [Furosemide] Itching   Rosuvastatin Other (See Comments)    Muscle Pain in Thighs   Latex Rash   Lisinopril Cough   Sulfa Antibiotics Nausea Only   Tramadol Itching    Medications Reviewed Today     Reviewed by [name], [facility] (Pharmacist) on 05/21/22 at 1228  Med List Status: <None>   Medication Order Taking? Sig Documenting Provider Last Dose Status Informant  albuterol (VENTOLIN HFA) 108 (90 Base) MCG/ACT inhaler [id number]  INHALE ONE TO TWO PUFFS BY MOUTH INTO THE LUNGS EVERY SIX HOURS AS NEEDED FOR WHEEZING OR SORTNESS OF BREATH [name], Rayford Halsted, MD  Active   atorvastatin  (LIPITOR) 20 MG tablet [id number]  TAKE 1 TABLET EVERY DAY [name], Rayford Halsted, MD  Active   Continuous Blood Gluc Sensor (FREESTYLE LIBRE 3 SENSOR) [place] [id number]  Place 1 sensor on the skin every 14 days. Use to check glucose continuously Jerilee Hoh  Everardo Beals, MD  Active   EPINEPHRINE 0.3 mg/0.3 mL IJ SOAJ injection [id number]  Inject 0.3 mg into the muscle as needed for anaphylaxis [name], Rayford Halsted, MD  Active Self  estradiol-norethindrone (ACTIVELLA) 1-0.5 MG tablet [id number]  TAKE ONE TABLET BY MOUTH ONE TIME DAILY [name], Rayford Halsted, MD  Active   ezetimibe (ZETIA) 10 MG tablet [id number]  Take 1 tablet (10 mg total) by mouth daily. [name], Rayford Halsted, MD  Active   famotidine (PEPCID) 20 MG tablet [id number]  TAKE 1 TABLET BY MOUTH TWICE A DAY [name], Rayford Halsted, MD  Active   furosemide (LASIX) 20 MG tablet [id number]  Take 1 tablet (20 mg total) by mouth daily as needed (leg swelling). [name], Rayford Halsted, MD  Active   levothyroxine (SYNTHROID) 150 MCG tablet [id number]  TAKE 1 TABLET EVERY DAY [name], Rayford Halsted, MD  Active   lidocaine-prilocaine (EMLA) cream [id number]  Apply 1 application. topically as needed. [name], MD  Active Self  losartan-hydrochlorothiazide [facility]) 50-12.5 MG tablet [id number]  TAKE 1 TABLET EVERY DAY [name], Rayford Halsted, MD  Active   Vitamin D, Ergocalciferol, (DRISDOL) 1.25 MG (50000 UNIT) CAPS capsule [id number]  Take 1 capsule (50,000 Units total) by mouth every 7 (seven) days for 12 doses. [name], Rayford Halsted, MD  Active   XIIDRA 5 % Bailey Mech [id number] Yes Place 1 drop into both eyes 2 (two) times daily as needed (dry eyes). [provider] Taking Active Self            Patient Active Problem List   Diagnosis Date Noted   DM (diabetes mellitus), type 2 ([place]) 03/29/2022   Genetic testing 06/02/2021   Family history of breast cancer 05/04/2021   Family history of ovarian cancer  05/04/2021   Family history of colon cancer 05/04/2021   Vitamin D deficiency 03/20/2021   Primary cancer of right upper lobe of lung ([place]) 10/21/2020   Port-A-Cath in place 06/13/2020   Pulmonary nodule 06/03/2020   Cancer of renal pelvis, unspecified laterality ([place]) 05/26/2020   Chronic kidney disease, stage 3a ([place]) 12/31/2019   Prediabetes 12/31/2019   Obesity (BMI 30-39.9) 12/31/2019   Major depression, recurrent, chronic (HCC) 10/24/2019   Leg edema 10/24/2019   Medication intolerance 10/24/2019   Benign microscopic hematuria 09/17/2019   Myalgia due to statin 29/79/8921   Umbilical hernia 19/41/7408   Sessile colonic polyp 10/04/2017   Status post right hip replacement 07/21/2015   ACE inhibitor intolerance 06/17/2015   DJD (degenerative joint disease), lumbar 06/17/2015   Essential hypertension 06/17/2015   Mixed hyperlipidemia 06/17/2015   Morbid obesity ([place]) 06/17/2015   Post-menopause on HRT (hormone replacement therapy) 06/17/2015   Postoperative hypothyroidism 06/17/2015   Primary osteoarthritis involving multiple joints 06/17/2015    Immunization History  Administered Date(s) Administered   Fluad Quad(high Dose 65+) 04/28/2016, 06/14/2019, 07/11/2020, 05/03/2022   Influenza, High Dose Seasonal PF 04/28/2016, 07/19/2017, 05/19/2018, 05/15/2021   Influenza, Seasonal, Injecte, Preservative Fre 04/25/2015   Influenza-Unspecified 04/25/2015, 04/28/2016, 07/12/2017, 05/25/2021   Janssen (J&J) SARS-COV-2 Vaccination 04/22/2020   Moderna Sars-Covid-2 Vaccination 05/15/2021   PFIZER(Purple Top)SARS-COV-2 Vaccination 07/03/2020, 01/13/2021   Pneumococcal Conjugate-13 06/13/2015   Pneumococcal Polysaccharide-23 06/10/2016   Pneumococcal-Unspecified 08/27/2019   Tdap 06/16/2014   Zoster Recombinat (Shingrix) 01/08/2020, 05/13/2020   Zoster, Live 05/27/2014    Patient just got set up with the freestyle libre. She is enjoying using it and feels a lot more aware of what  she is eating. She is always hungry and thinks this might have to be with this. Connected to freestyle [place] with patient in office. She is getting her medications covered through [place].  Patient is trying to eat before drinking coffee because she read somewhere that this can cause her blood sugars to go up. She is trying eat first before coffee. She doesn't like stevia with coffee or honey.   She has started at the weight loss center and is using a bread that has about 1/3 of carbs. She notices that the bread causes big spikes in her blood sugar.  She is going to bed at 8-9pm and is waking up at 3-4 am.  She had a severe episode with a spasm while she was at the urologist. It was a spasm in her torso and it was crippling pain. The office staff came in to try to take care of her.   She saw Dr. Joylene Grapes and wasn't sure what she was expecting and didn't feel like he gave her any suggestions. She felt like it was an unfulfilled visit.    Patient has been taking furosemide as needed but felt like she might need it regularly and she inquired about this. Discussed how this can impact kidney function and would need repeat lab work to make sure there is minimal impact to kidneys.  Conditions to be addressed/monitored:  Hypertension, Hyperlipidemia, Diabetes, Chronic Kidney Disease, Hypothyroidism, Osteoarthritis, and Overactive Bladder  Conditions addressed this visit: CKD, Diabetes, Overactive bladder  Care Plan : CCM Pharmacy Care Plan  Updates made by [name], [place] since 05/21/2022 12:00 AM     Problem: Problem: Hypertension, Hyperlipidemia, Chronic Kidney Disease, Hypothyroidism, Osteoarthritis, Tobacco use, and Overactive Bladder      Long-Range Goal: Patient-Specific Goal   Start Date: 11/16/2021  Expected End Date: 11/17/2022  Recent Progress: On track  Priority: High  Note:   Current Barriers:  Unable to independently monitor therapeutic efficacy Unable to achieve control of  diabetes   Pharmacist Clinical Goal(s):  Patient will achieve adherence to monitoring guidelines and medication adherence to achieve therapeutic efficacy achieve control of diabetes as evidenced by A1c  through collaboration with PharmD and provider.   Interventions: 1:1 collaboration with [name], Rayford Halsted, MD regarding development and update of comprehensive plan of care as evidenced by provider attestation and co-signature Inter-disciplinary care team collaboration (see longitudinal plan of care) Comprehensive medication review performed; medication list updated in electronic medical record  Hypertension (BP goal <140/90) -Not ideally controlled -Current treatment: Losartan-HCTZ 50-12.5 mg 1 tablet daily - Appropriate, Query effective, Safe, Accessible -Medications previously tried: lisinopril (cough)  -Current home readings: 140-150s after eating; usually 120/80s; wonders about accuracy with cuff (wrist cuff)  -Current dietary habits: sometimes pays attention to salt intake; gets water retention with salt intake -Current exercise habits: PT exercises; limited with pain -Denies hypotensive/hypertensive symptoms -Educated on BP goals and benefits of medications for prevention of heart attack, stroke and kidney damage; Importance of home blood pressure monitoring; Proper BP monitoring technique; -Counseled to monitor BP at home weekly, document, and provide log at future appointments -Counseled on diet and exercise extensively Recommended to continue current medication  Hyperlipidemia: (LDL goal < 70) -Not ideally controlled -Current treatment: Atorvastatin 20 mg 1 tablet daily - Appropriate, Query effective, Safe, Accessible Ezetimibe 10 mg 1 tablet daily - Appropriate, Query effective, Safe, Accessible -Medications previously tried: rosuvastatin (muscle pain)  -Current dietary patterns: not eating red meat; fried foods occasionally; cooks  with butter or olive oil; goes  through spurts of fiber in diet vs not -Current exercise habits: PT exercises; limited with back pain -Educated on Cholesterol goals;  Benefits of statin for ASCVD risk reduction; Importance of limiting foods high in cholesterol; -Counseled on diet and exercise extensively Recommended to continue current medication  Diabetes (A1c goal <7%) -Not ideally controlled -Current medications: No medications -Medications previously tried: none  -Current home glucose readings fasting glucose: using a CGM - average 144; GMI 6.8% post prandial glucose: n/a -Denies hypoglycemic/hyperglycemic symptoms -Current meal patterns:  breakfast: n/a  lunch: n/a  dinner: n/a snacks: n/a drinks: n/a -Current exercise: did not discuss -Educated on A1c and blood sugar goals; Continuous glucose monitoring; -Counseled to check feet daily and get yearly eye exams -Counseled on diet and exercise extensively   Overactive bladder (Goal: minimize symptoms) -Uncontrolled -Current treatment  No medications -Medications previously tried: oxybutynin (never took it)  -Recommended discussing with urologist about Myrbetriq or Gemtesa due to lessen chances of side effects.  Recommended Kegel exercises.  Hypothyroidism (Goal: TSH 0.35-4.5) -Controlled -Current treatment  Levothyroxine 150 mcg 1 tablet daily - Appropriate, Effective, Safe, Accessible -Medications previously tried: none  -Counseled on importance of taking on empty stomach.   Vitamin D deficiency (Goal: vitamin D 30-100) -Uncontrolled -Current treatment  Vitamin D 50,000 units once weekly - Appropriate, Query effective, Safe, Accessible -Medications previously tried: none  -Recommended repeat vitamin D level at next PCP office visit.   Post menopausal symptoms (Goal: minimize symptoms) -Controlled -Current treatment  Estradiol-norethindrone 1-0.5 mg 1 tablet every other day  - Appropriate, Effective, Query Safe, Accessible -Medications  previously tried: none  -Counseled on risks of long term risks with estrogen therapy.  Pain (Goal: minimize pain) -Uncontrolled -Current treatment  No medications -Medications previously tried: meloxicam (not helpful), ibuprofen (using too long), acetaminophen (not helpful), cyclobenzaprine (not helpful)  -Recommended to continue current medication   Health Maintenance -Vaccine gaps: COVID booster -Current therapy:  Albuterol HFA as needed Emla cream as needed Epipen as needed Viamin B12 1000 mcg daily Xiidra 5% solution 1 drop in both eyes as needed Tums as needed Ondansetron 4 mg 1 tablet as needed -Educated on Cost vs benefit of each product must be carefully weighed by individual consumer -Patient is satisfied with current therapy and denies issues -Counseled on non-pharmacologic management of symptoms such as elevating the head of your bed, avoiding eating 2-3 hours before bed, avoiding triggering foods such as acidic, spicy, or fatty foods, eating smaller meals, and wearing clothes that are loose around the waist  Patient Goals/Self-Care Activities Patient will:  - take medications as prescribed as evidenced by patient report and record review check blood pressure at least weekly, document, and provide at future appointments  Follow Up Plan: Telephone follow up appointment with care management team member scheduled for:4 months       Medication Assistance: None required.  Patient affirms current coverage meets needs.  Compliance/Adherence/Medication fill history: Care Gaps: COVID booster, colonoscopy, mammogram, foot exam, urine ACR Last BP - 120/78 on 05/03/2022 Last A1C - 7.5 on 03/29/2022  Star-Rating Drugs: Atorvastatin 20 mg - last filled 04/07/2022 90 DS at [facility] HCTZ 50/12.5 mg - last filled 04/07/2022 90 DS at [facility]  Patient's preferred pharmacy is:  [facility], [place] [address] Phone: [phone] Fax: [facility] [address] [place]  [place] [address] Phone: [phone] Fax: [phone]  [id number] [place]Phone: [phone]: [phone] Uses pill box? Yes - weekly Pt endorses 99% compliance  We discussed: Current pharmacy is preferred with insurance plan and patient is satisfied with pharmacy services Patient decided to: Continue current medication management strategy  Care Plan and Follow Up Patient Decision:  Patient agrees to Care Plan and Follow-up.  Plan: Telephone follow up appointment with care management team member scheduled for:  4 months  MJeni Salles PharmD, [phone]

## 2022-05-25 ENCOUNTER — Encounter (HOSPITAL_BASED_OUTPATIENT_CLINIC_OR_DEPARTMENT_OTHER): Payer: Self-pay | Admitting: Urology

## 2022-05-25 NOTE — Progress Notes (Signed)
Spoke w/ via phone for pre-op interview--- pt Lab needs dos---- [employer] results------ current ekg in chart/ epic COVID test -----patient states asymptomatic no test needed Arrive at ------- 0845 NPO after MN NO Solid Food.  Clear liquids from MN until--- 0745 Med rec completed Medications to take morning of surgery ----- pepcid Diabetic medication ----- n/a Patient instructed no nail polish to be worn day of surgery Patient instructed to bring photo id and insurance card day of surgery Patient aware to have Driver (ride ) / caregiver  for 24 hours after surgery --- daughter, tracey Patient Special Instructions ----- asked to bring rescue inhaler dos Pre-Op special Istructions ----- pt has Libre 3 CGM on right arm Patient verbalized understanding of instructions that were given at this phone interview. Patient denies shortness of breath, chest pain, fever, cough at this phone interview.     PCP: Dr A. Jerilee Hoh (03-02-2022 epic) Oncologist:  Dr Alen Blew Cassell Clement 04-20-2022 epic) Chest x-ray : 02-15-2022 and chest CT 10-06-2021 EKG : 02-15-2022 Echo : no Activity level: sob w/ long distance walk/ stairs but recovers quickly ,  ok with short walk /  Fasting Blood Sugar :   60-100   / Checks Blood Sugar -- times a day:  multiple times

## 2022-05-28 ENCOUNTER — Other Ambulatory Visit: Payer: Self-pay | Admitting: Internal Medicine

## 2022-05-28 DIAGNOSIS — R600 Localized edema: Secondary | ICD-10-CM

## 2022-06-01 ENCOUNTER — Inpatient Hospital Stay: Payer: Medicare HMO | Attending: Oncology

## 2022-06-01 ENCOUNTER — Other Ambulatory Visit: Payer: Self-pay

## 2022-06-01 DIAGNOSIS — Z452 Encounter for adjustment and management of vascular access device: Secondary | ICD-10-CM | POA: Diagnosis not present

## 2022-06-01 DIAGNOSIS — C3411 Malignant neoplasm of upper lobe, right bronchus or lung: Secondary | ICD-10-CM | POA: Insufficient documentation

## 2022-06-01 DIAGNOSIS — Z95828 Presence of other vascular implants and grafts: Secondary | ICD-10-CM

## 2022-06-01 MED ORDER — SODIUM CHLORIDE 0.9% FLUSH
10.0000 mL | Freq: Once | INTRAVENOUS | Status: AC
  Administered 2022-06-01: 10 mL

## 2022-06-01 MED ORDER — HEPARIN SOD (PORK) LOCK FLUSH 100 UNIT/ML IV SOLN
500.0000 [IU] | Freq: Once | INTRAVENOUS | Status: AC
  Administered 2022-06-01: 500 [IU]

## 2022-06-02 ENCOUNTER — Encounter (HOSPITAL_BASED_OUTPATIENT_CLINIC_OR_DEPARTMENT_OTHER): Payer: Self-pay | Admitting: Urology

## 2022-06-02 ENCOUNTER — Ambulatory Visit (HOSPITAL_BASED_OUTPATIENT_CLINIC_OR_DEPARTMENT_OTHER): Payer: Medicare HMO | Admitting: Certified Registered Nurse Anesthetist

## 2022-06-02 ENCOUNTER — Ambulatory Visit (HOSPITAL_BASED_OUTPATIENT_CLINIC_OR_DEPARTMENT_OTHER)
Admission: RE | Admit: 2022-06-02 | Discharge: 2022-06-02 | Disposition: A | Discharge status: Home / Self Care | Payer: Medicare HMO | Attending: Urology | Admitting: Urology

## 2022-06-02 ENCOUNTER — Other Ambulatory Visit: Payer: Self-pay | Admitting: Internal Medicine

## 2022-06-02 ENCOUNTER — Encounter (HOSPITAL_BASED_OUTPATIENT_CLINIC_OR_DEPARTMENT_OTHER)
Admission: RE | Disposition: A | Discharge status: Home / Self Care | Payer: Self-pay | Source: Home / Self Care | Attending: Urology

## 2022-06-02 DIAGNOSIS — Z800 Family history of malignant neoplasm of digestive organs: Secondary | ICD-10-CM | POA: Diagnosis not present

## 2022-06-02 DIAGNOSIS — F32A Depression, unspecified: Secondary | ICD-10-CM | POA: Diagnosis not present

## 2022-06-02 DIAGNOSIS — Z803 Family history of malignant neoplasm of breast: Secondary | ICD-10-CM | POA: Diagnosis not present

## 2022-06-02 DIAGNOSIS — E785 Hyperlipidemia, unspecified: Secondary | ICD-10-CM | POA: Insufficient documentation

## 2022-06-02 DIAGNOSIS — M1990 Unspecified osteoarthritis, unspecified site: Secondary | ICD-10-CM | POA: Diagnosis not present

## 2022-06-02 DIAGNOSIS — N1831 Chronic kidney disease, stage 3a: Secondary | ICD-10-CM | POA: Insufficient documentation

## 2022-06-02 DIAGNOSIS — Z9221 Personal history of antineoplastic chemotherapy: Secondary | ICD-10-CM | POA: Insufficient documentation

## 2022-06-02 DIAGNOSIS — Z79899 Other long term (current) drug therapy: Secondary | ICD-10-CM | POA: Diagnosis not present

## 2022-06-02 DIAGNOSIS — Z8551 Personal history of malignant neoplasm of bladder: Secondary | ICD-10-CM | POA: Diagnosis not present

## 2022-06-02 DIAGNOSIS — K219 Gastro-esophageal reflux disease without esophagitis: Secondary | ICD-10-CM | POA: Diagnosis not present

## 2022-06-02 DIAGNOSIS — J449 Chronic obstructive pulmonary disease, unspecified: Secondary | ICD-10-CM

## 2022-06-02 DIAGNOSIS — E1122 Type 2 diabetes mellitus with diabetic chronic kidney disease: Secondary | ICD-10-CM | POA: Insufficient documentation

## 2022-06-02 DIAGNOSIS — Z923 Personal history of irradiation: Secondary | ICD-10-CM | POA: Insufficient documentation

## 2022-06-02 DIAGNOSIS — Z85828 Personal history of other malignant neoplasm of skin: Secondary | ICD-10-CM | POA: Diagnosis not present

## 2022-06-02 DIAGNOSIS — E890 Postprocedural hypothyroidism: Secondary | ICD-10-CM | POA: Diagnosis not present

## 2022-06-02 DIAGNOSIS — Z01818 Encounter for other preprocedural examination: Secondary | ICD-10-CM

## 2022-06-02 DIAGNOSIS — Z79890 Hormone replacement therapy (postmenopausal): Secondary | ICD-10-CM

## 2022-06-02 DIAGNOSIS — Z87891 Personal history of nicotine dependence: Secondary | ICD-10-CM | POA: Diagnosis not present

## 2022-06-02 DIAGNOSIS — Z801 Family history of malignant neoplasm of trachea, bronchus and lung: Secondary | ICD-10-CM | POA: Diagnosis not present

## 2022-06-02 DIAGNOSIS — Z1279 Encounter for screening for malignant neoplasm of other genitourinary organs: Secondary | ICD-10-CM | POA: Diagnosis not present

## 2022-06-02 DIAGNOSIS — Z8553 Personal history of malignant neoplasm of renal pelvis: Secondary | ICD-10-CM | POA: Diagnosis not present

## 2022-06-02 DIAGNOSIS — I10 Essential (primary) hypertension: Secondary | ICD-10-CM

## 2022-06-02 DIAGNOSIS — I129 Hypertensive chronic kidney disease with stage 1 through stage 4 chronic kidney disease, or unspecified chronic kidney disease: Secondary | ICD-10-CM | POA: Diagnosis not present

## 2022-06-02 DIAGNOSIS — C659 Malignant neoplasm of unspecified renal pelvis: Secondary | ICD-10-CM | POA: Diagnosis not present

## 2022-06-02 DIAGNOSIS — Z6841 Body Mass Index (BMI) 40.0 and over, adult: Secondary | ICD-10-CM | POA: Insufficient documentation

## 2022-06-02 HISTORY — DX: Weakness: R53.1

## 2022-06-02 HISTORY — DX: Presence of other vascular implants and grafts: Z95.828

## 2022-06-02 HISTORY — PX: CYSTOSCOPY WITH URETEROSCOPY: SHX5123

## 2022-06-02 HISTORY — DX: Varicose veins of bilateral lower extremities with other complications: I83.893

## 2022-06-02 HISTORY — DX: Type 2 diabetes mellitus without complications: E11.9

## 2022-06-02 LAB — POCT I-STAT, CHEM 8
BUN: 27 mg/dL — ABNORMAL HIGH (ref 8–23)
Calcium, Ion: 1.17 mmol/L (ref 1.15–1.40)
Chloride: 102 mmol/L (ref 98–111)
Creatinine, Ser: 1.5 mg/dL — ABNORMAL HIGH (ref 0.44–1.00)
Glucose, Bld: 139 mg/dL — ABNORMAL HIGH (ref 70–99)
HCT: 39 % (ref 36.0–46.0)
Hemoglobin: 13.3 g/dL (ref 12.0–15.0)
Potassium: 3.5 mmol/L (ref 3.5–5.1)
Sodium: 140 mmol/L (ref 135–145)
TCO2: 25 mmol/L (ref 22–32)

## 2022-06-02 LAB — BASIC METABOLIC PANEL
Anion gap: 7 (ref 5–15)
BUN: 28 mg/dL — ABNORMAL HIGH (ref 8–23)
CO2: 23 mmol/L (ref 22–32)
Calcium: 8.4 mg/dL — ABNORMAL LOW (ref 8.9–10.3)
Chloride: 105 mmol/L (ref 98–111)
Creatinine, Ser: 1.45 mg/dL — ABNORMAL HIGH (ref 0.44–1.00)
GFR, Estimated: 38 mL/min — ABNORMAL LOW (ref >60)
Glucose, Bld: 170 mg/dL — ABNORMAL HIGH (ref 70–99)
Potassium: 3.5 mmol/L (ref 3.5–5.1)
Sodium: 135 mmol/L (ref 135–145)

## 2022-06-02 LAB — GLUCOSE, CAPILLARY: Glucose-Capillary: 131 mg/dL — ABNORMAL HIGH (ref 70–99)

## 2022-06-02 SURGERY — CYSTOSCOPY WITH URETEROSCOPY
Anesthesia: General | Site: Ureter | Laterality: Right

## 2022-06-02 MED ORDER — EPHEDRINE SULFATE (PRESSORS) 50 MG/ML IJ SOLN
INTRAMUSCULAR | Status: DC | PRN
  Administered 2022-06-02: 5 mg via INTRAVENOUS

## 2022-06-02 MED ORDER — SODIUM CHLORIDE 0.9 % IR SOLN
Status: DC | PRN
  Administered 2022-06-02: 15800 mL

## 2022-06-02 MED ORDER — LIDOCAINE 2% (20 MG/ML) 5 ML SYRINGE
INTRAMUSCULAR | Status: DC | PRN
  Administered 2022-06-02: 100 mg via INTRAVENOUS

## 2022-06-02 MED ORDER — PROPOFOL 10 MG/ML IV BOLUS
INTRAVENOUS | Status: DC | PRN
  Administered 2022-06-02 (×2): 200 mg via INTRAVENOUS

## 2022-06-02 MED ORDER — DEXAMETHASONE SODIUM PHOSPHATE 10 MG/ML IJ SOLN
INTRAMUSCULAR | Status: DC | PRN
  Administered 2022-06-02: 4 mg via INTRAVENOUS

## 2022-06-02 MED ORDER — ONDANSETRON HCL 4 MG/2ML IJ SOLN
INTRAMUSCULAR | Status: DC | PRN
  Administered 2022-06-02: 4 mg via INTRAVENOUS

## 2022-06-02 MED ORDER — PROPOFOL 10 MG/ML IV BOLUS
INTRAVENOUS | Status: AC
  Filled 2022-06-02: qty 20

## 2022-06-02 MED ORDER — FENTANYL CITRATE (PF) 250 MCG/5ML IJ SOLN
INTRAMUSCULAR | Status: DC | PRN
  Administered 2022-06-02: 50 ug via INTRAVENOUS

## 2022-06-02 MED ORDER — CIPROFLOXACIN IN D5W 400 MG/200ML IV SOLN
400.0000 mg | INTRAVENOUS | Status: AC
  Administered 2022-06-02: 400 mg via INTRAVENOUS

## 2022-06-02 MED ORDER — IOHEXOL 300 MG/ML  SOLN
INTRAMUSCULAR | Status: DC | PRN
  Administered 2022-06-02: 30 mL

## 2022-06-02 MED ORDER — CIPROFLOXACIN IN D5W 400 MG/200ML IV SOLN
INTRAVENOUS | Status: AC
  Filled 2022-06-02: qty 200

## 2022-06-02 MED ORDER — PHENYLEPHRINE 80 MCG/ML (10ML) SYRINGE FOR IV PUSH (FOR BLOOD PRESSURE SUPPORT)
PREFILLED_SYRINGE | INTRAVENOUS | Status: DC | PRN
  Administered 2022-06-02: 160 ug via INTRAVENOUS
  Administered 2022-06-02: 80 ug via INTRAVENOUS

## 2022-06-02 MED ORDER — SODIUM CHLORIDE 0.9 % IV SOLN: INTRAVENOUS | Status: DC

## 2022-06-02 MED ORDER — FENTANYL CITRATE (PF) 100 MCG/2ML IJ SOLN
INTRAMUSCULAR | Status: AC
  Filled 2022-06-02: qty 2

## 2022-06-02 SURGICAL SUPPLY — 25 items
APL SKNCLS STERI-STRIP NONHPOA (GAUZE/BANDAGES/DRESSINGS)
BAG DRAIN URO-CYSTO SKYTR STRL (DRAIN) ×1 IMPLANT
BAG DRN UROCATH (DRAIN) ×1
BASKET STONE 1.7 NGAGE (UROLOGICAL SUPPLIES) IMPLANT
BASKET ZERO TIP NITINOL 2.4FR (BASKET) ×1 IMPLANT
BENZOIN TINCTURE PRP APPL 2/3 (GAUZE/BANDAGES/DRESSINGS) IMPLANT
BSKT STON RTRVL ZERO TP 2.4FR (BASKET) ×1
CATH URETL OPEN 5X70 (CATHETERS) IMPLANT
CLOTH BEACON ORANGE TIMEOUT ST (SAFETY) ×1 IMPLANT
FIBER LASER FLEXIVA 365 (UROLOGICAL SUPPLIES) IMPLANT
GLOVE BIO SURGEON STRL SZ7.5 (GLOVE) ×1 IMPLANT
GOWN STRL REUS W/TWL XL LVL3 (GOWN DISPOSABLE) ×1 IMPLANT
GUIDEWIRE STR DUAL SENSOR (WIRE) IMPLANT
GUIDEWIRE ZIPWRE .038 STRAIGHT (WIRE) ×1 IMPLANT
IV NS IRRIG 3000ML ARTHROMATIC (IV SOLUTION) ×2 IMPLANT
KIT TURNOVER CYSTO (KITS) ×1 IMPLANT
MANIFOLD NEPTUNE II (INSTRUMENTS) ×1 IMPLANT
NS IRRIG 500ML POUR BTL (IV SOLUTION) ×1 IMPLANT
PACK CYSTO (CUSTOM PROCEDURE TRAY) ×1 IMPLANT
STRIP CLOSURE SKIN 1/2X4 (GAUZE/BANDAGES/DRESSINGS) IMPLANT
SYR 10ML LL (SYRINGE) ×1 IMPLANT
TRACTIP FLEXIVA PULS ID 200XHI (Laser) IMPLANT
TRACTIP FLEXIVA PULSE ID 200 (Laser)
TUBE CONNECTING 12X1/4 (SUCTIONS) IMPLANT
TUBING UROLOGY SET (TUBING) ×1 IMPLANT

## 2022-06-02 NOTE — Op Note (Signed)
Operative Note  Preoperative diagnosis:  1.  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  Cystoscopy with right ureteroscopy 2.  Bilateral retrograde pyelograms with intraoperative interpretation of fluoroscopic imaging  Surgeon: Ellison Hughs, MD  Assistants:  None  Anesthesia:  General  Complications:  None  EBL: Less than 5 mL  Specimens: 1.  None  Drains/Catheters: 1.  None  Intraoperative findings:   No evidence of urothelial carcinoma recurrence was seen involving the bladder or right upper urinary tract Solitary right collecting system with no filling defects or dilation involving the right ureter or right renal pelvis seen on retrograde pyelogram Solitary left collecting system with no filling defects or dilation involving the left ureter or left renal pelvis seen on retrograde pyelogram  Indication:  Maria Marquez is a 76 y.o. 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  She underwent a 6-week course of Jelmyto, which was completed on 04/14/2022.  She is here today for her first surveillance cystoscopy/ureteroscopy.  She has been consented for the above procedures, voices understanding and wishes to proceed.  Description of procedure:  After informed consent was obtained, the patient was brought to the operating room and general LMA anesthesia was administered. The patient was then placed in the dorsolithotomy position and prepped and draped in the usual sterile fashion. A timeout was performed. A 23 French rigid cystoscope was then inserted into the urethral meatus and advanced into the bladder under direct vision. A complete bladder survey revealed no intravesical pathology.  A 5 French ureteral catheter was then inserted into the  right ureteral orifice and a retrograde pyelogram was obtained, with the findings listed above.  A Glidewire was then used to intubate the lumen of the ureteral catheter and was advanced up to the right renal pelvis, under fluoroscopic guidance.  The catheter was then removed, leaving the wire in place.  A flexible ureteroscope was then advanced alongside the wire and up to the right renal pelvis.  A complete inspection of the right renal pelvis and all associated calyces revealed no evidence of urothelial carcinoma recurrence.  The flexible ureteroscope was then slowly retracted back down the ureter, again, with no evidence of UCC recurrence.  A 5 French ureteral catheter was then inserted into the left ureteral orifice and a retrograde pyelogram was obtained, with the findings listed above.  The catheter was then removed.  The patient's bladder was drained.  She tolerated the procedure well and was transferred to the postanesthesia in stable condition.  Plan: Follow-up in 3 months to plan her next surveillance ureteroscopy.

## 2022-06-02 NOTE — Anesthesia Preprocedure Evaluation (Addendum)
Anesthesia Evaluation  Patient identified by MRN, date of birth, ID band Patient awake    Reviewed: Allergy & Precautions, NPO status , Patient's Chart, lab work & pertinent test results  History of Anesthesia Complications (+) PONV and history of anesthetic complications  Airway Mallampati: II  TM Distance: >3 FB Neck ROM: Full    Dental  (+) Partial Upper, Partial Lower, Dental Advisory Given, Missing   Pulmonary COPD,  COPD inhaler, former smoker,  RUL adenocarcinoma: XRT   Pulmonary exam normal        Cardiovascular hypertension, Pt. on medications Normal cardiovascular exam     Neuro/Psych PSYCHIATRIC DISORDERS Depression negative neurological ROS     GI/Hepatic Neg liver ROS, GERD  Controlled and Medicated,  Endo/Other  diabetesHypothyroidism Morbid obesity  Renal/GU Renal InsufficiencyRenal diseaseRenal carcinoma: chemo     Musculoskeletal  (+) Arthritis , Osteoarthritis,    Abdominal (+) + obese,   Peds  Hematology negative hematology ROS (+)   Anesthesia Other Findings   Reproductive/Obstetrics                             Anesthesia Physical  Anesthesia Plan  ASA: 3  Anesthesia Plan: General   Post-op Pain Management: Minimal or no pain anticipated   Induction: Intravenous  PONV Risk Score and Plan: 4 or greater and Ondansetron, Treatment may vary due to age or medical condition and Dexamethasone  Airway Management Planned: LMA  Additional Equipment: None  Intra-op Plan:   Post-operative Plan: Extubation in OR  Informed Consent: I have reviewed the patients History and Physical, chart, labs and discussed the procedure including the risks, benefits and alternatives for the proposed anesthesia with the patient or authorized representative who has indicated his/her understanding and acceptance.     Dental advisory given  Plan Discussed with: CRNA  Anesthesia  Plan Comments:         Anesthesia Quick Evaluation

## 2022-06-02 NOTE — Transfer of Care (Signed)
Immediate Anesthesia Transfer of Care Note  Patient: Maria Marquez  Procedure(s) Performed: CYSTOSCOPY WITH URETEROSCOPY (Right: Ureter)  Patient Location: PACU  Anesthesia Type:General  Level of Consciousness: drowsy, patient cooperative and responds to stimulation  Airway & Oxygen Therapy: Patient Spontanous Breathing  Post-op Assessment: Report given to RN and Post -op Vital signs reviewed and stable  Post vital signs: Reviewed and stable  Last Vitals:  Vitals Value Taken Time  BP 161/129 06/02/22 1128  Temp    Pulse 71 06/02/22 1130  Resp 16 06/02/22 1130  SpO2 95 % 06/02/22 1130  Vitals shown include unvalidated device data.  Last Pain:  Vitals:   06/02/22 0851  TempSrc: Oral      Patients Stated Pain Goal: 4 (59/29/24 4628)  Complications: No notable events documented.

## 2022-06-02 NOTE — H&P (Signed)
Urology Preoperative H&P   Chief Complaint: Right renal pelvis cancer   History of Present Illness: Maria Marquez is a 76 y.o. female with a history of high grade UCC of the right renal pelvis.  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 She is s/p gem/cis chemotherapy and radiation to the thorax. Negative cysto/ureteroscopy on 09/16/21. She underwent surveillance cystoscopy/ureteroscopy on 01/27/2022 and was found to have multiple small papillary bladder tumors within the right renal pelvis, right ureter and bladder. All identifiable tumor burden was fulgurated at that time. 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 Subsequent surveillance ureteroscopy in 01/2022 revealed multiple papillary tumor recurrences, which prompted a 6 week course of Jelmyto, which was completed on 04/14/22.  She is here today for a surveillance ureteroscopy.   Past Medical History:  Diagnosis Date   Anemia associated with chemotherapy    followed by dr Alen Blew   Carcinoma of renal pelvis, right [facility])    urologist-- dr Marilena Trevathan/  oncologist-- dr Alen Blew;   dx 08/ 2021 ;  chemo 10/ 2021  to 12/ 2021 and complete laser tumor ablation;  recurrent 05/ 2023   completed 6 mitomyocin gel infusions via nephrostomy tube 08/ 2023   Chronic kidney disease, stage 3a ([place]) 12/31/2019   Diabetes mellitus type 2, diet-controlled ([place])    followed by pcp;    (05-25-2022  per pt check blood sugar multiple times daily w/ Libre,  fasting average 60-100)   Dyspnea on exertion    05-25-2022  pt stated sob w/ long distancewalk and stairs but recovers quickly when stop/ rest,  ok with household chores and short walks   Family history of breast cancer 05/04/2021   Family history of ovarian cancer 05/04/2021   Generalized weakness    GERD (gastroesophageal reflux disease)    History of basal cell carcinoma ([place]) excision    per pt in 1970s on nose   History of  chemotherapy    06-13-2020  to 12/ 2021  for right renal pelvis carcinoma   History of colon polyps    History of radiation therapy    11-13-2020 to 11-26-2020---   Right Lung- SBRT- Dr. Gery Pray   HLD (hyperlipidemia)    Hypertension    followed by pcp   Hypothyroidism, postsurgical 1979   followed by pcp   Leukocytosis    Nocturia    OA (osteoarthritis)    PONV (postoperative nausea and vomiting)    Since chemo, ponv   Port-A-Cath in place    Primary adenocarcinoma of upper lobe of right lung [facility])    oncologist--- shadad/  pulmonology-- dr byrum;  dx 02/ 2022;  s/p  SBRT 11-13-2020 to 11-26-2020   Urgency of urination    wears pads   Varicose veins of both legs with edema    Wears partial dentures    lower    Past Surgical History:  Procedure Laterality Date   COLONOSCOPY  last one 2017   CYSTOSCOPY W/ URETERAL STENT PLACEMENT Right 01/27/2022   Procedure: CYSTOSCOPY WITH RETROGRADE PYELOGRAM/ URETEROSCOPY/POSSIBLE BIOPSY/ POSSIBLE  URETERAL STENT PLACEMENT;  Surgeon: Ceasar Mons, MD;  Location: [facility];  Service: Urology;  Laterality: Right;   CYSTOSCOPY WITH BIOPSY Right 09/16/2021   Procedure: CYSTOSCOPY / URETEROSCOPY WITH BIOPSY/bugbee fulgeration/right retrograde;  Surgeon: Ceasar Mons, MD;  Location: [facility];  Service: Urology;  Laterality: Right;   CYSTOSCOPY WITH URETEROSCOPY Right 04/18/2020  Procedure: CYSTOSCOPY WITH URETEROSCOPY, BIOPSY;  Surgeon: Ceasar Mons, MD;  Location: [facility];  Service: Urology;  Laterality: Right;  ONLY NEEDS 45 MIN   CYSTOSCOPY WITH URETEROSCOPY AND STENT PLACEMENT N/A 12/31/2020   Procedure: CYSTOSCOPY WITH RIGHT URETEROSCOPY/ BILATERAL RETROGRADE PYELOGRAM/RIGHT URETERAL BIOPSY/ LASER ABLATION/RIGHT STENT PLACEMENT;  Surgeon: Ceasar Mons, MD;  Location: [facility];  Service: Urology;  Laterality: N/A;    CYSTOSCOPY/RETROGRADE/URETEROSCOPY Right 04/22/2021   Procedure: CYSTOSCOPY/RETROGRADE/URETEROSCOPY/ STENT PLACEMENT;  Surgeon: Ceasar Mons, MD;  Location: [facility];  Service: Urology;  Laterality: Right;   EYE SURGERY Bilateral 1987   tear ducts   IR IMAGING GUIDED PORT INSERTION  06/04/2020   IR NEPHROSTOMY PLACEMENT RIGHT  03/03/2022   THYROID LOBECTOMY Right 1979   TOTAL HIP ARTHROPLASTY Right 01/28/2016   Procedure: RIGHT TOTAL HIP ARTHROPLASTY ANTERIOR APPROACH;  Surgeon: [name], MD;  Location: [facility];  Service: Orthopedics;  Laterality: Right;   UMBILICAL HERNIA REPAIR  1980s   VIDEO BRONCHOSCOPY WITH ENDOBRONCHIAL NAVIGATION N/A 06/06/2020   Procedure: VIDEO BRONCHOSCOPY WITH ENDOBRONCHIAL NAVIGATION;  Surgeon: Collene Gobble, MD;  Location: [facility];  Service: Thoracic;  Laterality: N/A;   VIDEO BRONCHOSCOPY WITH ENDOBRONCHIAL NAVIGATION N/A 10/10/2020   Procedure: VIDEO BRONCHOSCOPY WITH ENDOBRONCHIAL NAVIGATION;  Surgeon: [name], MD;  Location: [facility];  Service: Thoracic;  Laterality: N/A;    Allergies:  Allergies  Allergen Reactions   Iodine Shortness Of Breath   Keflex [Cephalexin] Shortness Of Breath, Rash and Tinitus   Shellfish Allergy Hives   Shellfish-Derived Products Anaphylaxis and Hives   Hydrocodone Hives and Itching    Itching throat   Lasix [Furosemide] Itching   Rosuvastatin Other (See Comments)    Muscle Pain in Thighs   Latex Rash   Lisinopril Cough   Sulfa Antibiotics Nausea Only   Tramadol Itching    Family History  Problem Relation Age of Onset   Arthritis Mother    Breast cancer Mother 35   Schizophrenia Mother    Diabetes Father    Heart disease Father    Kidney disease Father    Stroke Sister    Lung cancer Brother        dx > 53; work-related exposures   Heart disease Brother    Heart disease Brother    Vision loss Brother        Glaucoma   Alcohol abuse Brother    Lung cancer Maternal  Aunt        dx > 17   Colon cancer Paternal Aunt        dx > 80   Head & neck cancer Paternal Uncle        dx 21s   Ovarian cancer Daughter 55   Breast cancer Niece 72   Leukemia Cousin        d. 44s    Social History:  reports that she quit smoking about 2 years ago. Her smoking use included cigarettes. She has a 45.00 pack-year smoking history. She has never used smokeless tobacco. She reports that she does not currently use alcohol. She reports that she does not use drugs.  ROS: A complete review of systems was performed.  All systems are negative except for pertinent findings as noted.  Physical Exam:  Vital signs in last 24 hours:   Constitutional:  Alert and oriented, No acute distress Cardiovascular: Regular rate and rhythm, No JVD Respiratory: Normal respiratory effort, Lungs clear bilaterally GI: Abdomen is  soft, nontender, nondistended, no abdominal masses GU: No CVA tenderness Lymphatic: No lymphadenopathy Neurologic: Grossly intact, no focal deficits Psychiatric: Normal mood and affect  Laboratory Data:  No results for input(s): "WBC", "HGB", "HCT", "PLT" in the last 72 hours.  No results for input(s): "NA", "K", "CL", "GLUCOSE", "BUN", "CALCIUM", "CREATININE" in the last 72 hours.  Invalid input(s): "CO3"   No results found for this or any previous visit (from the past 24 hour(s)). No results found for this or any previous visit (from the past 240 hour(s)).  Renal Function: No results for input(s): "CREATININE" in the last 168 hours. CrCl cannot be calculated (Patient's most recent lab result is older than the maximum 21 days allowed.).  Radiologic Imaging: No results found.  I independently reviewed the above imaging studies.  Assessment and Plan Maria Marquez is a 76 y.o. female with recurrent urothelial carcinoma involving the right renal pelvis   -The risks, benefits and alternatives of cystoscopy with right ureteroscopy with possible renal pelvis  biopsy and/or fulguration was discussed with the patient.  Risks included, but are not limited to: bleeding, urinary tract infection, ureteral injury/avulsion, ureteral stricture formation, MI, stroke, PE and the inherent risks of general anesthesia.  The patient voices understanding and wishes to proceed.     Ellison Hughs, MD 06/02/2022, 7:33 AM  [facility] Pager: [phone]

## 2022-06-02 NOTE — Anesthesia Procedure Notes (Signed)
Procedure Name: LMA Insertion Date/Time: 06/02/2022 10:54 AM  Performed by: Clearnce Sorrel, CRNAPre-anesthesia Checklist: Patient identified, Emergency Drugs available, Suction available and Patient being monitored Patient Re-evaluated:Patient Re-evaluated prior to induction Oxygen Delivery Method: Circle System Utilized Preoxygenation: Pre-oxygenation with 100% oxygen Induction Type: IV induction Ventilation: Mask ventilation without difficulty LMA: LMA inserted LMA Size: 4.0 Number of attempts: 1 Airway Equipment and Method: Bite block Placement Confirmation: positive ETCO2 Tube secured with: Tape Dental Injury: Teeth and Oropharynx as per pre-operative assessment

## 2022-06-02 NOTE — Discharge Instructions (Signed)

## 2022-06-02 NOTE — Anesthesia Postprocedure Evaluation (Signed)
Anesthesia Post Note  Patient: Maria Marquez  Procedure(s) Performed: CYSTOSCOPY WITH URETEROSCOPY (Right: Ureter)     Patient location during evaluation: Phase II Anesthesia Type: General Level of consciousness: awake Pain management: pain level controlled Vital Signs Assessment: post-procedure vital signs reviewed and stable Respiratory status: spontaneous breathing Cardiovascular status: stable Postop Assessment: no apparent nausea or vomiting Anesthetic complications: no   No notable events documented.  Last Vitals:  Vitals:   06/02/22 1157 06/02/22 1159  BP:    Pulse: 61 61  Resp: 14 17  Temp: 36.9 C   SpO2: 99% 96%    Last Pain:  Vitals:   06/02/22 1159  TempSrc:   PainSc: 0-No pain                 Huston Foley

## 2022-06-04 ENCOUNTER — Encounter (HOSPITAL_BASED_OUTPATIENT_CLINIC_OR_DEPARTMENT_OTHER): Payer: Self-pay | Admitting: Urology

## 2022-06-23 ENCOUNTER — Telehealth: Payer: Self-pay | Admitting: *Deleted

## 2022-06-23 NOTE — Telephone Encounter (Signed)
Prior Maria Marquez has been started for  [employer] Key: [id number]

## 2022-07-01 NOTE — Telephone Encounter (Signed)
PA was denied

## 2022-07-06 ENCOUNTER — Ambulatory Visit (INDEPENDENT_AMBULATORY_CARE_PROVIDER_SITE_OTHER): Payer: Medicare HMO | Admitting: Internal Medicine

## 2022-07-06 ENCOUNTER — Encounter: Payer: Self-pay | Admitting: Internal Medicine

## 2022-07-06 VITALS — BP 110/70 | HR 70 | Temp 97.7°F | Wt 273.0 lb

## 2022-07-06 DIAGNOSIS — E782 Mixed hyperlipidemia: Secondary | ICD-10-CM

## 2022-07-06 DIAGNOSIS — E1169 Type 2 diabetes mellitus with other specified complication: Secondary | ICD-10-CM

## 2022-07-06 DIAGNOSIS — I10 Essential (primary) hypertension: Secondary | ICD-10-CM | POA: Diagnosis not present

## 2022-07-06 LAB — POCT GLYCOSYLATED HEMOGLOBIN (HGB A1C): Hemoglobin A1C: 6.3 % — AB (ref 4.0–5.6)

## 2022-07-06 MED ORDER — FREESTYLE LIBRE 3 SENSOR MISC: 3 refills | Status: DC

## 2022-07-06 MED ORDER — ACCU-CHEK SOFTCLIX LANCETS MISC: 1.0000 | Freq: Every day | 12 refills | Status: DC

## 2022-07-06 MED ORDER — ACCU-CHEK GUIDE ME W/DEVICE KIT: 1.0000 | PACK | Freq: Every day | 2 refills | Status: DC

## 2022-07-06 NOTE — Addendum Note (Signed)
Addended by: Westley Hummer B on: 07/06/2022 01:01 PM   Modules accepted: Orders

## 2022-07-06 NOTE — Progress Notes (Signed)
Established Patient Office Visit     CC/Reason for Visit: 21-month follow-up chronic conditions  HPI: Maria Marquez is a 76 y.o. female who is coming in today for the above mentioned reasons. 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  Morbid obesity, hyperlipidemia, aortic atherosclerosis, hypertension, vitamin B12 deficiency, stage III chronic kidney disease.  She was recently diagnosed with type 2 diabetes.  She is wearing a continuous glucose monitor which has significantly improved her glycemic control.  She has lost 5 pounds since her last visit.  No acute concerns or complaints today.   Past Medical/Surgical History: Past Medical History:  Diagnosis Date   Anemia associated with chemotherapy    followed by dr Alen Blew   Carcinoma of renal pelvis, right [facility])    urologist-- dr winter/  oncologist-- dr Alen Blew;   dx 08/ 2021 ;  chemo 10/ 2021  to 12/ 2021 and complete laser tumor ablation;  recurrent 05/ 2023   completed 6 mitomyocin gel infusions via nephrostomy tube 08/ 2023   Chronic kidney disease, stage 3a ([place]) 12/31/2019   Diabetes mellitus type 2, diet-controlled ([place])    followed by pcp;    (05-25-2022  per pt check blood sugar multiple times daily w/ Libre,  fasting average 60-100)   Dyspnea on exertion    05-25-2022  pt stated sob w/ long distancewalk and stairs but recovers quickly when stop/ rest,  ok with household chores and short walks   Family history of breast cancer 05/04/2021   Family history of ovarian cancer 05/04/2021   Generalized weakness    GERD (gastroesophageal reflux disease)    History of basal cell carcinoma ([place]) excision    per pt in 1970s on nose   History of chemotherapy    06-13-2020  to 12/ 2021  for right renal pelvis carcinoma   History of colon polyps    History of radiation therapy    11-13-2020 to 11-26-2020---   Right Lung- SBRT- Dr.  Gery Pray   HLD (hyperlipidemia)    Hypertension    followed by pcp   Hypothyroidism, postsurgical 1979   followed by pcp   Leukocytosis    Nocturia    OA (osteoarthritis)    PONV (postoperative nausea and vomiting)    Since chemo, ponv   Port-A-Cath in place    Primary adenocarcinoma of upper lobe of right lung [facility])    oncologist--- shadad/  pulmonology-- dr byrum;  dx 02/ 2022;  s/p  SBRT 11-13-2020 to 11-26-2020   Urgency of urination    wears pads   Varicose veins of both legs with edema    Wears partial dentures    lower    Past Surgical History:  Procedure Laterality Date   COLONOSCOPY  last one 2017   CYSTOSCOPY W/ URETERAL STENT PLACEMENT Right 01/27/2022   Procedure: CYSTOSCOPY WITH RETROGRADE PYELOGRAM/ URETEROSCOPY/POSSIBLE BIOPSY/ POSSIBLE  URETERAL STENT PLACEMENT;  Surgeon: Ceasar Mons, MD;  Location: [facility];  Service: Urology;  Laterality: Right;   CYSTOSCOPY WITH BIOPSY Right 09/16/2021   Procedure: CYSTOSCOPY / URETEROSCOPY WITH BIOPSY/bugbee fulgeration/right retrograde;  Surgeon: Ceasar Mons, MD;  Location: [facility];  Service: Urology;  Laterality: Right;   CYSTOSCOPY WITH URETEROSCOPY Right 04/18/2020   Procedure: CYSTOSCOPY WITH URETEROSCOPY, BIOPSY;  Surgeon: Ceasar Mons, MD;  Location: [facility];  Service: Urology;  Laterality: Right;  ONLY NEEDS 45 MIN  CYSTOSCOPY WITH URETEROSCOPY Right 06/02/2022   Procedure: CYSTOSCOPY WITH URETEROSCOPY;  Surgeon: Ceasar Mons, MD;  Location[facility];  Service: Urology;  Laterality: Right;  ONLY NEEDS 60 MINS   CYSTOSCOPY WITH URETEROSCOPY AND STENT PLACEMENT N/A 12/31/2020   Procedure: CYSTOSCOPY WITH RIGHT URETEROSCOPY/ BILATERAL RETROGRADE PYELOGRAM/RIGHT URETERAL BIOPSY/ LASER ABLATION/RIGHT STENT PLACEMENT;  Surgeon: Ceasar Mons, MD;  Location[facility];  Service: Urology;   Laterality: N/A;   CYSTOSCOPY/RETROGRADE/URETEROSCOPY Right 04/22/2021   Procedure: CYSTOSCOPY/RETROGRADE/URETEROSCOPY/ STENT PLACEMENT;  Surgeon: Ceasar Mons, MD;  Location: [facility];  Service: Urology;  Laterality: Right;   EYE SURGERY Bilateral 1987   tear ducts   IR IMAGING GUIDED PORT INSERTION  06/04/2020   IR NEPHROSTOMY PLACEMENT RIGHT  03/03/2022   THYROID LOBECTOMY Right 1979   TOTAL HIP ARTHROPLASTY Right 01/28/2016   Procedure: RIGHT TOTAL HIP ARTHROPLASTY ANTERIOR APPROACH;  Surgeon: [name], MD;  Location: [facility];  Service: Orthopedics;  Laterality: Right;   UMBILICAL HERNIA REPAIR  1980s   VIDEO BRONCHOSCOPY WITH ENDOBRONCHIAL NAVIGATION N/A 06/06/2020   Procedure: VIDEO BRONCHOSCOPY WITH ENDOBRONCHIAL NAVIGATION;  Surgeon: Collene Gobble, MD;  Location: [place];  Service: Thoracic;  Laterality: N/A;   VIDEO BRONCHOSCOPY WITH ENDOBRONCHIAL NAVIGATION N/A 10/10/2020   Procedure: VIDEO BRONCHOSCOPY WITH ENDOBRONCHIAL NAVIGATION;  Surgeon: [name], MD;  Location: [place];  Service: Thoracic;  Laterality: N/A;    Social History:  reports that she quit smoking about 2 years ago. Her smoking use included cigarettes. She has a 45.00 pack-year smoking history. She has never used smokeless tobacco. She reports that she does not currently use alcohol. She reports that she does not use drugs.  Allergies: Allergies  Allergen Reactions   Iodine Shortness Of Breath   Keflex [Cephalexin] Shortness Of Breath, Rash and Tinitus   Shellfish Allergy Hives   Shellfish-Derived Products Anaphylaxis and Hives   Hydrocodone Hives and Itching    Itching throat   Lasix [Furosemide] Itching   Rosuvastatin Other (See Comments)    Muscle Pain in Thighs   Latex Rash   Lisinopril Cough   Sulfa Antibiotics Nausea Only   Tramadol Itching    Family History:  Family History  Problem Relation Age of Onset   Arthritis Mother    Breast cancer Mother 80    Schizophrenia Mother    Diabetes Father    Heart disease Father    Kidney disease Father    Stroke Sister    Lung cancer Brother        dx > 43; work-related exposures   Heart disease Brother    Heart disease Brother    Vision loss Brother        Glaucoma   Alcohol abuse Brother    Lung cancer Maternal Aunt        dx > 108   Colon cancer Paternal Aunt        dx > 43   Head & neck cancer Paternal Uncle        dx 13s   Ovarian cancer Daughter 7   Breast cancer Niece 58   Leukemia Cousin        d. 56s     Current Outpatient Medications:    albuterol (VENTOLIN HFA) 108 (90 Base) MCG/ACT inhaler, INHALE ONE TO TWO PUFFS BY MOUTH INTO THE LUNGS EVERY SIX HOURS AS NEEDED FOR WHEEZING OR SORTNESS OF BREATH, Disp: 18 g, Rfl: 0   atorvastatin (LIPITOR) 20  MG tablet, TAKE 1 TABLET EVERY DAY (Patient taking differently: Take 20 mg by mouth at bedtime.), Disp: 90 tablet, Rfl: 1   Continuous Blood Gluc Sensor (FREESTYLE LIBRE 3 SENSOR) MISC, Place 1 sensor on the skin every 14 days. Use to check glucose continuously, Disp: 2 each, Rfl: 3   EPINEPHRINE 0.3 mg/0.3 mL IJ SOAJ injection, Inject 0.3 mg into the muscle as needed for anaphylaxis, Disp: 2 each, Rfl: 0   estradiol-norethindrone (ACTIVELLA) 1-0.5 MG tablet, TAKE ONE TABLET BY MOUTH ONE TIME DAILY, Disp: 28 tablet, Rfl: 0   ezetimibe (ZETIA) 10 MG tablet, Take 1 tablet (10 mg total) by mouth daily. (Patient taking differently: Take 10 mg by mouth at bedtime.), Disp: 90 tablet, Rfl: 1   famotidine (PEPCID) 20 MG tablet, TAKE 1 TABLET BY MOUTH TWICE A DAY (Patient taking differently: Take 20 mg by mouth 2 (two) times daily.), Disp: 180 tablet, Rfl: 1   furosemide (LASIX) 20 MG tablet, TAKE 1 TABLET BY MOUTH DAILY AS NEEDED FOR LEG SWELLING, Disp: 90 tablet, Rfl: 0   levothyroxine (SYNTHROID) 150 MCG tablet, TAKE 1 TABLET EVERY DAY (Patient taking differently: Take 150 mcg by mouth at bedtime.), Disp: 90 tablet, Rfl: 1   lidocaine-prilocaine  (EMLA) cream, Apply 1 application. topically as needed., Disp: 30 g, Rfl: 0   losartan-hydrochlorothiazide (HYZAAR) 50-12.5 MG tablet, TAKE 1 TABLET EVERY DAY (Patient taking differently: Take 1 tablet by mouth daily.), Disp: 90 tablet, Rfl: 1   XIIDRA 5 % SOLN, Place 1 drop into both eyes 2 (two) times daily as needed (dry eyes)., Disp: , Rfl:   Review of Systems:  Constitutional: Denies fever, chills, diaphoresis, appetite change and fatigue.  HEENT: Denies photophobia, eye pain, redness, hearing loss, ear pain, congestion, sore throat, rhinorrhea, sneezing, mouth sores, trouble swallowing, neck pain, neck stiffness and tinnitus.   Respiratory: Denies SOB, DOE, cough, chest tightness,  and wheezing.   Cardiovascular: Denies chest pain, palpitations and leg swelling.  Gastrointestinal: Denies nausea, vomiting, abdominal pain, diarrhea, constipation, blood in stool and abdominal distention.  Genitourinary: Denies dysuria, urgency, frequency, hematuria, flank pain and difficulty urinating.  Endocrine: Denies: hot or cold intolerance, sweats, changes in hair or nails, polyuria, polydipsia. Musculoskeletal: Denies myalgias, back pain, joint swelling, arthralgias and gait problem.  Skin: Denies pallor, rash and wound.  Neurological: Denies dizziness, seizures, syncope, weakness, light-headedness, numbness and headaches.  Hematological: Denies adenopathy. Easy bruising, personal or family bleeding history  Psychiatric/Behavioral: Denies suicidal ideation, mood changes, confusion, nervousness, sleep disturbance and agitation    Physical Exam: Vitals:   07/06/22 1137  BP: 110/70  Pulse: 70  Temp: 97.7 F (36.5 C)  TempSrc: Oral  SpO2: 98%  Weight: 273 lb (123.8 kg)    Body mass index is 44.74 kg/m.   Constitutional: NAD, calm, comfortable Eyes: PERRL, lids and conjunctivae normal ENMT: Mucous membranes are moist.   Psychiatric: Normal judgment and insight. Alert and oriented x 3.  Normal mood.    Impression and Plan:  Type 2 diabetes mellitus with other specified complication, without long-term current use of insulin ([place]) - Plan: POCT glycosylated hemoglobin (Hb A1C)  Essential hypertension  Morbid obesity (HCC)  Mixed hyperlipidemia  -A1c of 6.3 demonstrates excellent diabetic control. -Blood pressure is well controlled. -LDL was at goal at last check over the summer. -Discussed healthy lifestyle, including increased physical activity and better food choices to promote weight loss.    Time spent:32 minutes reviewing chart, interviewing and examining patient and formulating plan  of care.      [name], MD [facility]

## 2022-07-07 NOTE — Addendum Note (Signed)
Encounter addended by: Janine Limbo, RT on: 07/07/2022 9:36 AM  Actions taken: Imaging Exam ended

## 2022-07-08 ENCOUNTER — Telehealth: Payer: Self-pay | Admitting: Pharmacist

## 2022-07-08 NOTE — Chronic Care Management (AMB) (Signed)
Chronic Care Management Pharmacy Assistant   Name: Maria Marquez  MRN: [id number] DOB: 10/03/1945  Reason for Encounter: Disease State / Hypertension Assessment Call   Conditions to be addressed/monitored: HTN  Recent office visits:  07/06/2022 [name] MD - Patient was seen for Type 2 diabetes mellitus with other specified complication, without long-term current use of insulin and additional concerns. No medication changes. Follow up in 3 months.   Recent consult visits:  None  Hospital visits:  Admitted to [facility] on 06/02/2022 (4 hours) due to cystoscopy with ureteroscopy.    New?Medications Started at [facility] Discharge:?? No new medications Medication Changes at Hospital Discharge: No medication changes Medications Discontinued at Hospital Discharge: No medications discontinued Medications that remain the same after Hospital Discharge:??  -All other medications will remain the same.    Medications: Outpatient Encounter Medications as of 07/08/2022  Medication Sig   Accu-Chek Softclix Lancets lancets 1 each by Other route daily. Dx e 11.9   albuterol (VENTOLIN HFA) 108 (90 Base) MCG/ACT inhaler INHALE ONE TO TWO PUFFS BY MOUTH INTO THE LUNGS EVERY SIX HOURS AS NEEDED FOR WHEEZING OR SORTNESS OF BREATH   atorvastatin (LIPITOR) 20 MG tablet TAKE 1 TABLET EVERY DAY (Patient taking differently: Take 20 mg by mouth at bedtime.)   Blood Glucose Monitoring Suppl (ACCU-CHEK GUIDE ME) w/Device KIT 1 each by Does not apply route daily.   Continuous Blood Gluc Sensor (FREESTYLE LIBRE 3 SENSOR) MISC Place 1 sensor on the skin every 14 days. Use to check glucose continuously   EPINEPHRINE 0.3 mg/0.3 mL IJ SOAJ injection Inject 0.3 mg into the muscle as needed for anaphylaxis   estradiol-norethindrone (ACTIVELLA) 1-0.5 MG tablet TAKE ONE TABLET BY MOUTH ONE TIME DAILY   ezetimibe (ZETIA) 10 MG tablet Take 1 tablet (10 mg total) by mouth daily. (Patient  taking differently: Take 10 mg by mouth at bedtime.)   famotidine (PEPCID) 20 MG tablet TAKE 1 TABLET BY MOUTH TWICE A DAY (Patient taking differently: Take 20 mg by mouth 2 (two) times daily.)   furosemide (LASIX) 20 MG tablet TAKE 1 TABLET BY MOUTH DAILY AS NEEDED FOR LEG SWELLING   levothyroxine (SYNTHROID) 150 MCG tablet TAKE 1 TABLET EVERY DAY (Patient taking differently: Take 150 mcg by mouth at bedtime.)   lidocaine-prilocaine (EMLA) cream Apply 1 application. topically as needed.   losartan-hydrochlorothiazide (HYZAAR) 50-12.5 MG tablet TAKE 1 TABLET EVERY DAY (Patient taking differently: Take 1 tablet by mouth daily.)   XIIDRA 5 % SOLN Place 1 drop into both eyes 2 (two) times daily as needed (dry eyes).   No facility-administered encounter medications on file as of 07/08/2022.  Fill History:   Dispensed Days Supply Quantity Provider Pharmacy  albuterol sulfate HFA 90 mcg/actuation aerosol inhaler 02/15/2022 25 18 g      Dispensed Days Supply Quantity Provider Pharmacy  atorvastatin 20 mg tablet 06/19/2022 90 90 tablet      Dispensed Days Supply Quantity Provider Pharmacy  epinephrine 0.3 mg/0.3 mL injection, auto-injector 12/29/2021 2 2 each      Dispensed Days Supply Quantity Provider Pharmacy  estradiol-norethindrone acet 1 mg-0.5 mg tablet 06/03/2022 28 28 tablet      Dispensed Days Supply Quantity Provider Pharmacy  ezetimibe 10 mg tablet 04/29/2022 90 90 tablet      Dispensed Days Supply Quantity Provider Pharmacy  FAMOTIDINE 20 MG TABLET 05/17/2022 90 180 each      Dispensed Days Supply Quantity Provider Pharmacy  FUROSEMIDE 20  MG TABLET 06/01/2022 90 90 each      Dispensed Days Supply Quantity Provider Pharmacy  levothyroxine 150 mcg tablet 05/08/2022 90 90 tablet      Dispensed Days Supply Quantity Provider Pharmacy  lidocaine-prilocaine 2.5 %-2.5 % topical cream 12/30/2021 30 30 g      Dispensed Days Supply Quantity Provider Pharmacy  losartan 50  mg-hydrochlorothiazide 12.5 mg tablet 06/19/2022 90 90 tablet      Reviewed chart prior to disease state call. Spoke with patient regarding BP  Recent Office Vitals: BP Readings from Last 3 Encounters:  07/06/22 110/70  06/02/22 (!) 165/67  05/03/22 120/78   Pulse Readings from Last 3 Encounters:  07/06/22 70  06/02/22 65  05/03/22 64    Wt Readings from Last 3 Encounters:  07/06/22 273 lb (123.8 kg)  06/02/22 270 lb 14.4 oz (122.9 kg)  05/03/22 277 lb 1.6 oz (125.7 kg)     Kidney Function Lab Results  Component Value Date/Time   CREATININE 1.45 (H) 06/02/2022 11:20 AM   CREATININE 1.50 (H) 06/02/2022 09:27 AM   CREATININE 1.15 (H) 04/20/2022 09:54 AM   CREATININE 1.45 (H) 10/06/2021 10:15 AM   GFR 32.38 (L) 03/29/2022 08:07 AM   GFRNONAA 38 (L) 06/02/2022 11:20 AM   GFRNONAA 50 (L) 04/20/2022 09:54 AM   GFRAA 51 (L) 04/15/2020 03:01 PM       Latest Ref Rng & Units 06/02/2022   11:20 AM 06/02/2022    9:27 AM 04/20/2022    9:54 AM  BMP  Glucose 70 - 99 mg/dL 170  139  124   BUN 8 - 23 mg/dL _0 Creatinine 0.44 - 1.00 mg/dL 1.45  1.50  1.15   Sodium 135 - 145 mmol/L 135  140  139   Potassium 3.5 - 5.1 mmol/L 3.5  3.5  3.6   Chloride 98 - 111 mmol/L 105  102  104   CO2 22 - 32 mmol/L 23   29   Calcium 8.9 - 10.3 mg/dL 8.4   9.2     Current antihypertensive regimen:  Losartan HCTZ 50/12.5 mg daily  How often are you checking your Blood Pressure? Patient has been checking her blood pressure 1-2 times per month.   Current home BP readings: Patient states her blood pressures are always good around 120/80  What recent interventions/DTPs have been made by any provider to improve Blood Pressure control since last CPP Visit: No recent intervention  Any recent hospitalizations or ED visits since last visit with CPP? Admitted to [facility] on 06/02/2022 (4 hours) due to cystoscopy with ureteroscopy.    What diet changes have been made to  improve Blood Pressure Control?  Patient is following a low carb and low sodium diet Breakfast - patient will have greek yogurt or high protein shake Lunch - patient will have ham or [place] sandwich with keto bread Dinner - patient will have a meat with vegetables  What exercise is being done to improve your Blood Pressure Control?  Patient is doing a little bit of walking.   Adherence Review: Is the patient currently on ACE/ARB medication? Yes Does the patient have >5 day gap between last estimated fill dates? No  Care Gaps: AWV - 07/08/22 message to Ramond Craver Last BP - 110/70 on 07/06/2022 Last A1C - 6.3 on 07/06/2022 Foot exam - never done AWV - overdue Covid  - postponed Colonoscopy - postponed Mammogram - postponed  Star Rating  Drugs: Atorvastatin 20 mg - last filled 06/19/2022 90 DS at [facility] Losartan HCTZ 50/12.5 mg - last filled 06/19/2022 90 DS at [place] [phone]

## 2022-07-11 ENCOUNTER — Other Ambulatory Visit: Payer: Self-pay | Admitting: Internal Medicine

## 2022-07-12 ENCOUNTER — Telehealth: Payer: Self-pay | Admitting: Internal Medicine

## 2022-07-12 NOTE — Telephone Encounter (Signed)
Patient has decided to opt out of her AWV since she has had her physical for this year. Nothing further needed at this time.       FYI

## 2022-07-12 NOTE — Telephone Encounter (Signed)
-----   Message from Pearson Forster sent at 07/08/2022  3:34 PM EST ----- Regarding: FW: awv Hello Selina,   This is Narda Rutherford one of Maddie's CMA's. Can you schedule this patient for an AWV. Thanks.  ----- Message ----- From: Darl Householder Sent: 07/08/2022   2:55 PM EST To: Pearson Forster Subject: RE: awv                                        This is Dr Jerilee Hoh patient.  She does her own AWVs   ----- Message ----- From: Pearson Forster Sent: 07/08/2022  11:55 AM EST To: Darl Householder Subject: awv                                            Patient needs scheduled for awv. Thanks

## 2022-07-13 ENCOUNTER — Encounter: Payer: Self-pay | Admitting: Internal Medicine

## 2022-07-13 MED ORDER — ACCU-CHEK GUIDE VI STRP: 1.0000 | ORAL_STRIP | Freq: Every day | 12 refills | Status: DC

## 2022-07-19 ENCOUNTER — Other Ambulatory Visit: Payer: Self-pay | Admitting: Internal Medicine

## 2022-07-19 DIAGNOSIS — Z79890 Hormone replacement therapy (postmenopausal): Secondary | ICD-10-CM

## 2022-07-20 ENCOUNTER — Ambulatory Visit (INDEPENDENT_AMBULATORY_CARE_PROVIDER_SITE_OTHER): Payer: Medicare HMO | Admitting: Podiatry

## 2022-07-20 DIAGNOSIS — M79674 Pain in right toe(s): Secondary | ICD-10-CM | POA: Diagnosis not present

## 2022-07-20 DIAGNOSIS — B351 Tinea unguium: Secondary | ICD-10-CM

## 2022-07-20 DIAGNOSIS — E1142 Type 2 diabetes mellitus with diabetic polyneuropathy: Secondary | ICD-10-CM | POA: Diagnosis not present

## 2022-07-20 DIAGNOSIS — M79675 Pain in left toe(s): Secondary | ICD-10-CM | POA: Diagnosis not present

## 2022-07-20 NOTE — Patient Instructions (Signed)
Look for urea 40% cream or ointment and apply to the thickened dry skin / calluses. This can be bought over the counter, at a pharmacy or online such as [employer].  

## 2022-07-20 NOTE — Progress Notes (Unsigned)
  Subjective:  Patient ID: Maria Marquez, female    DOB: 1945/10/13,  MRN: [id number]  Chief Complaint  Patient presents with   Diabetes    np blisters, dfc, nail trim   Nail Problem    76 y.o. female presents with the above complaint. History confirmed with patient.  She has burning tingling tightness and pain in her feet occasionally.  The nails are thickened elongated she is unable to cut them due to the thickness  Objective:  Physical Exam: warm, good capillary refill, no trophic changes or ulcerative lesions, normal DP and PT pulses, and abnormal monofilament seen with loss of protective sensation. Left Foot: dystrophic yellowed discolored nail plates with subungual debris Right Foot: dystrophic yellowed discolored nail plates with subungual debris  Assessment:   1. Pain due to onychomycosis of toenails of both feet   2. Type 2 diabetes mellitus with diabetic polyneuropathy, without long-term current use of insulin ([place])      Plan:  Patient was evaluated and treated and all questions answered.  Patient educated on diabetes. Discussed proper diabetic foot care and discussed risks and complications of disease. Educated patient in depth on reasons to return to the office immediately should he/she discover anything concerning or new on the feet. All questions answered. Discussed proper shoes as well.   Discussed the etiology and treatment options for the condition in detail with the patient. Educated patient on the topical and oral treatment options for mycotic nails. Recommended debridement of the nails today. Sharp and mechanical debridement performed of all painful and mycotic nails today. Nails debrided in length and thickness using a nail nipper to level of comfort. Discussed treatment options including appropriate shoe gear. Follow up as needed for painful nails.     No follow-ups on file.

## 2022-07-21 ENCOUNTER — Other Ambulatory Visit: Payer: Self-pay

## 2022-07-21 ENCOUNTER — Inpatient Hospital Stay: Payer: Medicare HMO | Attending: Oncology

## 2022-07-21 DIAGNOSIS — Z452 Encounter for adjustment and management of vascular access device: Secondary | ICD-10-CM | POA: Insufficient documentation

## 2022-07-21 DIAGNOSIS — C3411 Malignant neoplasm of upper lobe, right bronchus or lung: Secondary | ICD-10-CM | POA: Insufficient documentation

## 2022-07-21 DIAGNOSIS — C659 Malignant neoplasm of unspecified renal pelvis: Secondary | ICD-10-CM

## 2022-07-21 DIAGNOSIS — Z95828 Presence of other vascular implants and grafts: Secondary | ICD-10-CM

## 2022-07-21 LAB — CMP (CANCER CENTER ONLY)
ALT: 13 U/L (ref 0–44)
AST: 12 U/L — ABNORMAL LOW (ref 15–41)
Albumin: 4.2 g/dL (ref 3.5–5.0)
Alkaline Phosphatase: 76 U/L (ref 38–126)
Anion gap: 8 (ref 5–15)
BUN: 27 mg/dL — ABNORMAL HIGH (ref 8–23)
CO2: 27 mmol/L (ref 22–32)
Calcium: 9.5 mg/dL (ref 8.9–10.3)
Chloride: 104 mmol/L (ref 98–111)
Creatinine: 1.36 mg/dL — ABNORMAL HIGH (ref 0.44–1.00)
GFR, Estimated: 41 mL/min — ABNORMAL LOW (ref >60)
Glucose, Bld: 129 mg/dL — ABNORMAL HIGH (ref 70–99)
Potassium: 4 mmol/L (ref 3.5–5.1)
Sodium: 139 mmol/L (ref 135–145)
Total Bilirubin: 0.7 mg/dL (ref 0.3–1.2)
Total Protein: 7.1 g/dL (ref 6.5–8.1)

## 2022-07-21 LAB — CBC WITH DIFFERENTIAL (CANCER CENTER ONLY)
Abs Immature Granulocytes: 0.02 10*3/uL (ref 0.00–0.07)
Basophils Absolute: 0 10*3/uL (ref 0.0–0.1)
Basophils Relative: 0 %
Eosinophils Absolute: 0.3 10*3/uL (ref 0.0–0.5)
Eosinophils Relative: 3 %
HCT: 37.6 % (ref 36.0–46.0)
Hemoglobin: 12.5 g/dL (ref 12.0–15.0)
Immature Granulocytes: 0 %
Lymphocytes Relative: 19 %
Lymphs Abs: 1.5 10*3/uL (ref 0.7–4.0)
MCH: 30.6 pg (ref 26.0–34.0)
MCHC: 33.2 g/dL (ref 30.0–36.0)
MCV: 92.2 fL (ref 80.0–100.0)
Monocytes Absolute: 0.7 10*3/uL (ref 0.1–1.0)
Monocytes Relative: 8 %
Neutro Abs: 5.7 10*3/uL (ref 1.7–7.7)
Neutrophils Relative %: 70 %
Platelet Count: 222 10*3/uL (ref 150–400)
RBC: 4.08 MIL/uL (ref 3.87–5.11)
RDW: 13.5 % (ref 11.5–15.5)
WBC Count: 8.2 10*3/uL (ref 4.0–10.5)
nRBC: 0 % (ref 0.0–0.2)

## 2022-07-21 MED ORDER — SODIUM CHLORIDE 0.9% FLUSH
10.0000 mL | Freq: Once | INTRAVENOUS | Status: AC
  Administered 2022-07-21: 10 mL

## 2022-07-21 MED ORDER — HEPARIN SOD (PORK) LOCK FLUSH 100 UNIT/ML IV SOLN
500.0000 [IU] | Freq: Once | INTRAVENOUS | Status: AC
  Administered 2022-07-21: 500 [IU]

## 2022-07-29 ENCOUNTER — Other Ambulatory Visit: Payer: Self-pay | Admitting: Internal Medicine

## 2022-08-09 ENCOUNTER — Other Ambulatory Visit: Payer: Self-pay | Admitting: Internal Medicine

## 2022-08-09 DIAGNOSIS — C3411 Malignant neoplasm of upper lobe, right bronchus or lung: Secondary | ICD-10-CM

## 2022-08-10 DIAGNOSIS — Z20822 Contact with and (suspected) exposure to covid-19: Secondary | ICD-10-CM | POA: Diagnosis not present

## 2022-08-10 DIAGNOSIS — R059 Cough, unspecified: Secondary | ICD-10-CM | POA: Diagnosis not present

## 2022-08-10 DIAGNOSIS — J208 Acute bronchitis due to other specified organisms: Secondary | ICD-10-CM | POA: Diagnosis not present

## 2022-08-11 ENCOUNTER — Ambulatory Visit: Payer: Medicare HMO | Admitting: Internal Medicine

## 2022-08-12 ENCOUNTER — Encounter: Payer: Self-pay | Admitting: Internal Medicine

## 2022-08-12 MED ORDER — FREESTYLE LIBRE 3 READER DEVI: 1.0000 | Freq: Every day | 3 refills | Status: AC

## 2022-08-12 NOTE — Telephone Encounter (Signed)
Rx for reader for [facility] sent

## 2022-08-18 ENCOUNTER — Encounter: Payer: Self-pay | Admitting: Family Medicine

## 2022-08-18 ENCOUNTER — Ambulatory Visit (INDEPENDENT_AMBULATORY_CARE_PROVIDER_SITE_OTHER): Payer: Medicare HMO | Admitting: Family Medicine

## 2022-08-18 VITALS — BP 98/68 | HR 75 | Temp 97.6°F | Wt 263.0 lb

## 2022-08-18 DIAGNOSIS — J40 Bronchitis, not specified as acute or chronic: Secondary | ICD-10-CM

## 2022-08-18 MED ORDER — AMOXICILLIN-POT CLAVULANATE 875-125 MG PO TABS: 1.0000 | ORAL_TABLET | Freq: Two times a day (BID) | ORAL | 0 refills | Status: DC

## 2022-08-18 MED ORDER — HYDROCODONE BIT-HOMATROP MBR 5-1.5 MG/5ML PO SOLN: 5.0000 mL | ORAL | 0 refills | Status: DC | PRN

## 2022-08-18 NOTE — Progress Notes (Signed)
   Subjective:    Patient ID: Maria Marquez, female    DOB: 12-Nov-1945, 76 y.o.   MRN: [id number]  HPI Here for a ST, chest congestion and coughing that started 2 weeks ago. She went to an urgent care clinic on 08-10-22, and she brings in a summary of this visit. She tested negative for strep, Covid, flu, and RSV. She had a CXR that showed peribronchial thickening and a RUL mass. She sees Oncology and they are following this. She was treated with 7 days of Doxycycline, Prednisone 20 mg BID for 5 days, and an albuterol inhaler. Today she is somewhat better, but she is still coughing frequently. She feels congestion in her chest, but she cannot bring it up.    Review of Systems  Constitutional: Negative.   HENT:  Positive for congestion and sore throat. Negative for postnasal drip and sinus pain.   Eyes: Negative.   Respiratory:  Positive for cough. Negative for shortness of breath and wheezing.        Objective:   Physical Exam Constitutional:      Appearance: Normal appearance.     Comments: Coughing frequently   HENT:     Right Ear: Tympanic membrane, ear canal and external ear normal.     Left Ear: Tympanic membrane, ear canal and external ear normal.     Nose: Nose normal.     Mouth/Throat:     Pharynx: Oropharynx is clear.  Eyes:     Conjunctiva/sclera: Conjunctivae normal.  Pulmonary:     Effort: Pulmonary effort is normal.     Breath sounds: Normal breath sounds.  Lymphadenopathy:     Cervical: No cervical adenopathy.  Neurological:     Mental Status: She is alert.           Assessment & Plan:  Partially treated bronchitis. We will treat with 10 days of Augmentin and Hycodan syrup for the cough. She can use the inhaler if needed. We spent a total of (31   ) minutes reviewing records and discussing these issues.  Alysia Penna, MD

## 2022-08-24 ENCOUNTER — Telehealth: Payer: Self-pay

## 2022-08-24 NOTE — Telephone Encounter (Signed)
Received faxed request from [facility] on [address].  Alternative requested b/c named medication is on back order. Please change to Promethazine DM or Guaifenesin with Codeine.

## 2022-08-28 ENCOUNTER — Other Ambulatory Visit: Payer: Self-pay | Admitting: Internal Medicine

## 2022-08-28 DIAGNOSIS — R600 Localized edema: Secondary | ICD-10-CM

## 2022-08-31 ENCOUNTER — Encounter: Payer: Self-pay | Admitting: Internal Medicine

## 2022-08-31 ENCOUNTER — Ambulatory Visit (INDEPENDENT_AMBULATORY_CARE_PROVIDER_SITE_OTHER): Payer: Medicare HMO | Admitting: Internal Medicine

## 2022-08-31 VITALS — BP 120/68 | HR 75 | Temp 97.6°F | Wt 273.9 lb

## 2022-08-31 DIAGNOSIS — J040 Acute laryngitis: Secondary | ICD-10-CM

## 2022-08-31 NOTE — Progress Notes (Signed)
Established Patient Office Visit     CC/Reason for Visit: Hoarse voice  HPI: Maria Marquez is a 77 y.o. female who is coming in today for the above mentioned reasons.  She has been having URI symptoms for the past month.  On December 19 she went to an urgent care where she was given doxycycline and prednisone.  She then followed up with Dr. Sarajane Jews on December 27 due to persistent coughing and at that point was diagnosed with bronchitis and prescribed Augmentin and Hycodan cough syrup.  She states she has improved but now her voice is hoarse.   Past Medical/Surgical History: Past Medical History:  Diagnosis Date   Anemia associated with chemotherapy    followed by dr Alen Blew   Carcinoma of renal pelvis, right [facility])    urologist-- dr winter/  oncologist-- dr Alen Blew;   dx 08/ 2021 ;  chemo 10/ 2021  to 12/ 2021 and complete laser tumor ablation;  recurrent 05/ 2023   completed 6 mitomyocin gel infusions via nephrostomy tube 08/ 2023   Chronic kidney disease, stage 3a ([place]) 12/31/2019   Diabetes mellitus type 2, diet-controlled ([place])    followed by pcp;    (05-25-2022  per pt check blood sugar multiple times daily w/ Libre,  fasting average 60-100)   Dyspnea on exertion    05-25-2022  pt stated sob w/ long distancewalk and stairs but recovers quickly when stop/ rest,  ok with household chores and short walks   Family history of breast cancer 05/04/2021   Family history of ovarian cancer 05/04/2021   Generalized weakness    GERD (gastroesophageal reflux disease)    History of basal cell carcinoma ([place]) excision    per pt in 1970s on nose   History of chemotherapy    06-13-2020  to 12/ 2021  for right renal pelvis carcinoma   History of colon polyps    History of radiation therapy    11-13-2020 to 11-26-2020---   Right Lung- SBRT- Dr. Gery Pray   HLD (hyperlipidemia)    Hypertension    followed by pcp   Hypothyroidism, postsurgical 1979   followed by pcp   Leukocytosis     Nocturia    OA (osteoarthritis)    PONV (postoperative nausea and vomiting)    Since chemo, ponv   Port-A-Cath in place    Primary adenocarcinoma of upper lobe of right lung [facility])    oncologist--- shadad/  pulmonology-- dr byrum;  dx 02/ 2022;  s/p  SBRT 11-13-2020 to 11-26-2020   Urgency of urination    wears pads   Varicose veins of both legs with edema    Wears partial dentures    lower    Past Surgical History:  Procedure Laterality Date   COLONOSCOPY  last one 2017   CYSTOSCOPY W/ URETERAL STENT PLACEMENT Right 01/27/2022   Procedure: CYSTOSCOPY WITH RETROGRADE PYELOGRAM/ URETEROSCOPY/POSSIBLE BIOPSY/ POSSIBLE  URETERAL STENT PLACEMENT;  Surgeon: Ceasar Mons, MD;  Location: [facility];  Service: Urology;  Laterality: Right;   CYSTOSCOPY WITH BIOPSY Right 09/16/2021   Procedure: CYSTOSCOPY / URETEROSCOPY WITH BIOPSY/bugbee fulgeration/right retrograde;  Surgeon: Ceasar Mons, MD;  Location: [facility];  Service: Urology;  Laterality: Right;   CYSTOSCOPY WITH URETEROSCOPY Right 04/18/2020   Procedure: CYSTOSCOPY WITH URETEROSCOPY, BIOPSY;  Surgeon: Ceasar Mons, MD;  Location: [facility];  Service: Urology;  Laterality: Right;  ONLY NEEDS 45 MIN   CYSTOSCOPY WITH URETEROSCOPY Right 06/02/2022  Procedure: CYSTOSCOPY WITH URETEROSCOPY;  Surgeon: Ceasar Mons, MD;  Location: [facility];  Service: Urology;  Laterality: Right;  ONLY NEEDS 60 MINS   CYSTOSCOPY WITH URETEROSCOPY AND STENT PLACEMENT N/A 12/31/2020   Procedure: CYSTOSCOPY WITH RIGHT URETEROSCOPY/ BILATERAL RETROGRADE PYELOGRAM/RIGHT URETERAL BIOPSY/ LASER ABLATION/RIGHT STENT PLACEMENT;  Surgeon: Ceasar Mons, MD;  Location: [facility];  Service: Urology;  Laterality: N/A;   CYSTOSCOPY/RETROGRADE/URETEROSCOPY Right 04/22/2021   Procedure: CYSTOSCOPY/RETROGRADE/URETEROSCOPY/ STENT PLACEMENT;  Surgeon: Ceasar Mons, MD;  Location: [facility];  Service: Urology;  Laterality: Right;   EYE SURGERY Bilateral 1987   tear ducts   IR IMAGING GUIDED PORT INSERTION  06/04/2020   IR NEPHROSTOMY PLACEMENT RIGHT  03/03/2022   THYROID LOBECTOMY Right 1979   TOTAL HIP ARTHROPLASTY Right 01/28/2016   Procedure: RIGHT TOTAL HIP ARTHROPLASTY ANTERIOR APPROACH;  Surgeon: [name], MD;  [facility];  Service: Orthopedics;  Laterality: Right;   UMBILICAL HERNIA REPAIR  1980s   VIDEO BRONCHOSCOPY WITH ENDOBRONCHIAL NAVIGATION N/A 06/06/2020   Procedure: VIDEO BRONCHOSCOPY WITH ENDOBRONCHIAL NAVIGATION;  Surgeon: Collene Gobble, MD;  Location: [place];  Service: Thoracic;  Laterality: N/A;   VIDEO BRONCHOSCOPY WITH ENDOBRONCHIAL NAVIGATION N/A 10/10/2020   Procedure: VIDEO BRONCHOSCOPY WITH ENDOBRONCHIAL NAVIGATION;  Surgeon: [name], MD;  Location: [place];  Service: Thoracic;  Laterality: N/A;    Social History:  reports that she quit smoking about 2 years ago. Her smoking use included cigarettes. She has a 45.00 pack-year smoking history. She has never used smokeless tobacco. She reports that she does not currently use alcohol. She reports that she does not use drugs.  Allergies: Allergies  Allergen Reactions   Iodine Shortness Of Breath   Keflex [Cephalexin] Shortness Of Breath, Rash and Tinitus   Shellfish Allergy Hives   Shellfish-Derived Products Anaphylaxis and Hives   Hydrocodone Hives and Itching    Itching throat   Lasix [Furosemide] Itching   Rosuvastatin Other (See Comments)    Muscle Pain in Thighs   Latex Rash   Lisinopril Cough   Sulfa Antibiotics Nausea Only   Tramadol Itching    Family History:  Family History  Problem Relation Age of Onset   Arthritis Mother    Breast cancer Mother 49   Schizophrenia Mother    Diabetes Father    Heart disease Father    Kidney disease Father    Stroke Sister    Lung cancer Brother        dx > 73;  work-related exposures   Heart disease Brother    Heart disease Brother    Vision loss Brother        Glaucoma   Alcohol abuse Brother    Lung cancer Maternal Aunt        dx > 53   Colon cancer Paternal Aunt        dx > 110   Head & neck cancer Paternal Uncle        dx 66s   Ovarian cancer Daughter 83   Breast cancer Niece 3   Leukemia Cousin        d. 47s     Current Outpatient Medications:    Accu-Chek Softclix Lancets lancets, 1 each by Other route daily. Dx e 11.9, Disp: 100 each, Rfl: 12   albuterol (VENTOLIN HFA) 108 (90 Base) MCG/ACT inhaler, INHALE ONE OR TWO PUFFS BY MOUTH INTO THE LUNGS EVERY SIX HOURS AS NEEDED FOR WHEEZING OR  SHORTNESS OF BREATH, Disp: 18 g, Rfl: 2   amoxicillin-clavulanate (AUGMENTIN) 875-125 MG tablet, Take 1 tablet by mouth 2 (two) times daily., Disp: 20 tablet, Rfl: 0   atorvastatin (LIPITOR) 20 MG tablet, TAKE 1 TABLET EVERY DAY (Patient taking differently: Take 20 mg by mouth at bedtime.), Disp: 90 tablet, Rfl: 1   Blood Glucose Monitoring Suppl (ACCU-CHEK GUIDE ME) w/Device KIT, 1 each by Does not apply route daily., Disp: 1 kit, Rfl: 2   Continuous Blood Gluc Receiver (FREESTYLE LIBRE 3 READER) DEVI, 1 each by Does not apply route daily., Disp: 1 each, Rfl: 3   Continuous Blood Gluc Sensor (FREESTYLE LIBRE 3 SENSOR) MISC, PLACE 1 SENSOR ON THE SKIN EVERY 14 DAYS, CHECK GLUCOSE CONTINUOUSLY, Disp: 2 each, Rfl: 6   EPINEPHRINE 0.3 mg/0.3 mL IJ SOAJ injection, Inject 0.3 mg into the muscle as needed for anaphylaxis, Disp: 2 each, Rfl: 0   estradiol-norethindrone (ACTIVELLA) 1-0.5 MG tablet, TAKE ONE TABLET BY MOUTH ONE TIME DAILY, Disp: 28 tablet, Rfl: 0   ezetimibe (ZETIA) 10 MG tablet, TAKE 1 TABLET EVERY DAY, Disp: 90 tablet, Rfl: 1   famotidine (PEPCID) 20 MG tablet, TAKE 1 TABLET BY MOUTH TWICE A DAY (Patient taking differently: Take 20 mg by mouth 2 (two) times daily.), Disp: 180 tablet, Rfl: 1   furosemide (LASIX) 20 MG tablet, TAKE 1 TABLET BY  MOUTH EVERY DAY AS NEEDED FOR LEG SWELLING, Disp: 90 tablet, Rfl: 1   glucose blood (ACCU-CHEK GUIDE) test strip, 1 each by Other route daily. Use as instructed, Disp: 100 each, Rfl: 12   HYDROcodone bit-homatropine (HYCODAN) 5-1.5 MG/5ML syrup, Take 5 mLs by mouth every 4 (four) hours as needed for cough., Disp: 240 mL, Rfl: 0   levothyroxine (SYNTHROID) 150 MCG tablet, TAKE 1 TABLET EVERY DAY (Patient taking differently: Take 150 mcg by mouth at bedtime.), Disp: 90 tablet, Rfl: 1   lidocaine-prilocaine (EMLA) cream, Apply 1 application. topically as needed., Disp: 30 g, Rfl: 0   losartan-hydrochlorothiazide (HYZAAR) 50-12.5 MG tablet, TAKE 1 TABLET EVERY DAY (Patient taking differently: Take 1 tablet by mouth daily.), Disp: 90 tablet, Rfl: 1   XIIDRA 5 % SOLN, Place 1 drop into both eyes 2 (two) times daily as needed (dry eyes)., Disp: , Rfl:   Review of Systems:  Constitutional: Denies fever, chills, diaphoresis, appetite change and fatigue.  HEENT: Denies photophobia, eye pain, redness, hearing loss, mouth sores, trouble swallowing, neck pain, neck stiffness and tinnitus.   Respiratory: Denies SOB, DOE,  chest tightness,  and wheezing.   Cardiovascular: Denies chest pain, palpitations and leg swelling.  Gastrointestinal: Denies nausea, vomiting, abdominal pain, diarrhea, constipation, blood in stool and abdominal distention.  Genitourinary: Denies dysuria, urgency, frequency, hematuria, flank pain and difficulty urinating.  Endocrine: Denies: hot or cold intolerance, sweats, changes in hair or nails, polyuria, polydipsia. Musculoskeletal: Denies myalgias, back pain, joint swelling, arthralgias and gait problem.  Skin: Denies pallor, rash and wound.  Neurological: Denies dizziness, seizures, syncope, weakness, light-headedness, numbness and headaches.  Hematological: Denies adenopathy. Easy bruising, personal or family bleeding history  Psychiatric/Behavioral: Denies suicidal ideation, mood  changes, confusion, nervousness, sleep disturbance and agitation    Physical Exam: Vitals:   08/31/22 1456  BP: 120/68  Pulse: 75  Temp: 97.6 F (36.4 C)  TempSrc: Oral  SpO2: 99%  Weight: 273 lb 14.4 oz (124.2 kg)    Body mass index is 44.89 kg/m.   Constitutional: NAD, calm, comfortable Eyes: PERRL, lids and conjunctivae normal ENMT:  Mucous membranes are moist. Respiratory: clear to auscultation bilaterally, no wheezing, no crackles. Normal respiratory effort. No accessory muscle use.  Cardiovascular: Regular rate and rhythm, no murmurs / rubs / gallops. No extremity edema.  Psychiatric: Normal judgment and insight. Alert and oriented x 3. Normal mood.    Impression and Plan:  Laryngitis  -She has had 2 recent rounds of antibiotics, I do not believe further treatment is necessary.  Symptomatic management only at this point. -She knows to follow-up with me if symptoms worsen.  Time spent:21 minutes reviewing chart, interviewing and examining patient and formulating plan of care.       [name], MD [place]

## 2022-09-01 ENCOUNTER — Encounter: Payer: Self-pay | Admitting: Oncology

## 2022-09-01 ENCOUNTER — Telehealth: Payer: Self-pay | Admitting: Oncology

## 2022-09-01 NOTE — Telephone Encounter (Signed)
Per 1/10 IB, message has been left w pt

## 2022-09-06 DIAGNOSIS — C651 Malignant neoplasm of right renal pelvis: Secondary | ICD-10-CM | POA: Diagnosis not present

## 2022-09-10 ENCOUNTER — Other Ambulatory Visit: Payer: Self-pay | Admitting: Urology

## 2022-09-10 ENCOUNTER — Encounter: Payer: Self-pay | Admitting: Internal Medicine

## 2022-09-14 ENCOUNTER — Encounter: Payer: Self-pay | Admitting: Internal Medicine

## 2022-09-17 ENCOUNTER — Telehealth: Payer: Medicare HMO

## 2022-09-17 ENCOUNTER — Other Ambulatory Visit: Payer: Self-pay | Admitting: Internal Medicine

## 2022-09-20 ENCOUNTER — Ambulatory Visit (INDEPENDENT_AMBULATORY_CARE_PROVIDER_SITE_OTHER): Payer: Medicare HMO | Admitting: Internal Medicine

## 2022-09-20 ENCOUNTER — Encounter: Payer: Self-pay | Admitting: Internal Medicine

## 2022-09-20 ENCOUNTER — Other Ambulatory Visit: Payer: Self-pay | Admitting: Internal Medicine

## 2022-09-20 VITALS — BP 130/70 | HR 80 | Temp 98.9°F | Wt 270.3 lb

## 2022-09-20 DIAGNOSIS — E559 Vitamin D deficiency, unspecified: Secondary | ICD-10-CM

## 2022-09-20 DIAGNOSIS — R1012 Left upper quadrant pain: Secondary | ICD-10-CM | POA: Diagnosis not present

## 2022-09-20 DIAGNOSIS — Z79890 Hormone replacement therapy (postmenopausal): Secondary | ICD-10-CM

## 2022-09-20 DIAGNOSIS — R1084 Generalized abdominal pain: Secondary | ICD-10-CM | POA: Diagnosis not present

## 2022-09-20 LAB — POC URINALSYSI DIPSTICK (AUTOMATED)
Bilirubin, UA: NEGATIVE
Glucose, UA: NEGATIVE
Ketones, UA: NEGATIVE
Leukocytes, UA: NEGATIVE
Nitrite, UA: NEGATIVE
Protein, UA: NEGATIVE
Spec Grav, UA: 1.02 (ref 1.010–1.025)
Urobilinogen, UA: 0.2 E.U./dL
pH, UA: 6 (ref 5.0–8.0)

## 2022-09-20 LAB — COMPREHENSIVE METABOLIC PANEL
ALT: 13 U/L (ref 0–35)
AST: 11 U/L (ref 0–37)
Albumin: 4.1 g/dL (ref 3.5–5.2)
Alkaline Phosphatase: 79 U/L (ref 39–117)
BUN: 25 mg/dL — ABNORMAL HIGH (ref 6–23)
CO2: 28 mEq/L (ref 19–32)
Calcium: 9.5 mg/dL (ref 8.4–10.5)
Chloride: 100 mEq/L (ref 96–112)
Creatinine, Ser: 1.31 mg/dL — ABNORMAL HIGH (ref 0.40–1.20)
GFR: 40.1 mL/min — ABNORMAL LOW (ref >60.00)
Glucose, Bld: 121 mg/dL — ABNORMAL HIGH (ref 70–99)
Potassium: 4.1 mEq/L (ref 3.5–5.1)
Sodium: 137 mEq/L (ref 135–145)
Total Bilirubin: 0.9 mg/dL (ref 0.2–1.2)
Total Protein: 7.1 g/dL (ref 6.0–8.3)

## 2022-09-20 LAB — CBC WITH DIFFERENTIAL/PLATELET
Basophils Absolute: 0 10*3/uL (ref 0.0–0.1)
Basophils Relative: 0.4 % (ref 0.0–3.0)
Eosinophils Absolute: 0.2 10*3/uL (ref 0.0–0.7)
Eosinophils Relative: 2.3 % (ref 0.0–5.0)
HCT: 39.5 % (ref 36.0–46.0)
Hemoglobin: 13.1 g/dL (ref 12.0–15.0)
Lymphocytes Relative: 17.6 % (ref 12.0–46.0)
Lymphs Abs: 1.6 10*3/uL (ref 0.7–4.0)
MCHC: 33.2 g/dL (ref 30.0–36.0)
MCV: 90.6 fl (ref 78.0–100.0)
Monocytes Absolute: 0.8 10*3/uL (ref 0.1–1.0)
Monocytes Relative: 8.9 % (ref 3.0–12.0)
Neutro Abs: 6.5 10*3/uL (ref 1.4–7.7)
Neutrophils Relative %: 70.8 % (ref 43.0–77.0)
Platelets: 271 10*3/uL (ref 150.0–400.0)
RBC: 4.36 Mil/uL (ref 3.87–5.11)
RDW: 15.5 % (ref 11.5–15.5)
WBC: 9.2 10*3/uL (ref 4.0–10.5)

## 2022-09-20 LAB — VITAMIN D 25 HYDROXY (VIT D DEFICIENCY, FRACTURES): VITD: 31.12 ng/mL (ref 30.00–100.00)

## 2022-09-20 LAB — LIPASE: Lipase: 19 U/L (ref 11.0–59.0)

## 2022-09-20 NOTE — Addendum Note (Signed)
Addended by: Westley Hummer B on: 09/20/2022 12:56 PM   Modules accepted: Orders

## 2022-09-20 NOTE — Progress Notes (Signed)
Established Patient Office Visit     CC/Reason for Visit: Left upper quadrant abdominal pain  HPI: Maria Marquez is a 77 y.o. female who is coming in today for the above mentioned reasons.  For the past 3 days she has noted left upper quadrant abdominal pain.  She denies nausea, vomiting, diarrhea.  She describes the pain as a constant ache.  She has had no change in her bowel movements, no urinary symptoms.   Past Medical/Surgical History: Past Medical History:  Diagnosis Date   Anemia associated with chemotherapy    followed by dr Alen Blew   Carcinoma of renal pelvis, right [facility])    urologist-- dr winter/  oncologist-- dr Alen Blew;   dx 08/ 2021 ;  chemo 10/ 2021  to 12/ 2021 and complete laser tumor ablation;  recurrent 05/ 2023   completed 6 mitomyocin gel infusions via nephrostomy tube 08/ 2023   Chronic kidney disease, stage 3a ([place]) 12/31/2019   Diabetes mellitus type 2, diet-controlled ([place])    followed by pcp;    (05-25-2022  per pt check blood sugar multiple times daily w/ Libre,  fasting average 60-100)   Dyspnea on exertion    05-25-2022  pt stated sob w/ long distancewalk and stairs but recovers quickly when stop/ rest,  ok with household chores and short walks   Family history of breast cancer 05/04/2021   Family history of ovarian cancer 05/04/2021   Generalized weakness    GERD (gastroesophageal reflux disease)    History of basal cell carcinoma ([place]) excision    per pt in 1970s on nose   History of chemotherapy    06-13-2020  to 12/ 2021  for right renal pelvis carcinoma   History of colon polyps    History of radiation therapy    11-13-2020 to 11-26-2020---   Right Lung- SBRT- Dr. Gery Pray   HLD (hyperlipidemia)    Hypertension    followed by pcp   Hypothyroidism, postsurgical 1979   followed by pcp   Leukocytosis    Nocturia    OA (osteoarthritis)    PONV (postoperative nausea and vomiting)    Since chemo, ponv   Port-A-Cath in place    Primary  adenocarcinoma of upper lobe of right lung [facility])    oncologist--- shadad/  pulmonology-- dr byrum;  dx 02/ 2022;  s/p  SBRT 11-13-2020 to 11-26-2020   Urgency of urination    wears pads   Varicose veins of both legs with edema    Wears partial dentures    lower    Past Surgical History:  Procedure Laterality Date   COLONOSCOPY  last one 2017   CYSTOSCOPY W/ URETERAL STENT PLACEMENT Right 01/27/2022   Procedure: CYSTOSCOPY WITH RETROGRADE PYELOGRAM/ URETEROSCOPY/POSSIBLE BIOPSY/ POSSIBLE  URETERAL STENT PLACEMENT;  Surgeon: Ceasar Mons, MD;  [facility];  Service: Urology;  Laterality: Right;   CYSTOSCOPY WITH BIOPSY Right 09/16/2021   Procedure: CYSTOSCOPY / URETEROSCOPY WITH BIOPSY/bugbee fulgeration/right retrograde;  Surgeon: Ceasar Mons, MD;  Location: [facility];  Service: Urology;  Laterality: Right;   CYSTOSCOPY WITH URETEROSCOPY Right 04/18/2020   Procedure: CYSTOSCOPY WITH URETEROSCOPY, BIOPSY;  Surgeon: Ceasar Mons, MD;  [facility];  Service: Urology;  Laterality: Right;  ONLY NEEDS 45 MIN   CYSTOSCOPY WITH URETEROSCOPY Right 06/02/2022   Procedure: CYSTOSCOPY WITH URETEROSCOPY;  Surgeon: Ceasar Mons, MD;  Location: [facility];  Service: Urology;  Laterality: Right;  ONLY NEEDS 60 MINS   CYSTOSCOPY WITH URETEROSCOPY AND STENT PLACEMENT N/A 12/31/2020   Procedure: CYSTOSCOPY WITH RIGHT URETEROSCOPY/ BILATERAL RETROGRADE PYELOGRAM/RIGHT URETERAL BIOPSY/ LASER ABLATION/RIGHT STENT PLACEMENT;  Surgeon: Ceasar Mons, MD;  Location: [facility];  Service: Urology;  Laterality: N/A;   CYSTOSCOPY/RETROGRADE/URETEROSCOPY Right 04/22/2021   Procedure: CYSTOSCOPY/RETROGRADE/URETEROSCOPY/ STENT PLACEMENT;  Surgeon: Ceasar Mons, MD;  Location: [facility];  Service: Urology;  Laterality: Right;   EYE SURGERY Bilateral 1987    tear ducts   IR IMAGING GUIDED PORT INSERTION  06/04/2020   IR NEPHROSTOMY PLACEMENT RIGHT  03/03/2022   THYROID LOBECTOMY Right 1979   TOTAL HIP ARTHROPLASTY Right 01/28/2016   Procedure: RIGHT TOTAL HIP ARTHROPLASTY ANTERIOR APPROACH;  Surgeon: [name], MD;  [facility];  Service: Orthopedics;  Laterality: Right;   UMBILICAL HERNIA REPAIR  1980s   VIDEO BRONCHOSCOPY WITH ENDOBRONCHIAL NAVIGATION N/A 06/06/2020   Procedure: VIDEO BRONCHOSCOPY WITH ENDOBRONCHIAL NAVIGATION;  Surgeon: Collene Gobble, MD;  Location: [place];  Service: Thoracic;  Laterality: N/A;   VIDEO BRONCHOSCOPY WITH ENDOBRONCHIAL NAVIGATION N/A 10/10/2020   Procedure: VIDEO BRONCHOSCOPY WITH ENDOBRONCHIAL NAVIGATION;  Surgeon: [name], MD;  Location: [place];  Service: Thoracic;  Laterality: N/A;    Social History:  reports that she quit smoking about 2 years ago. Her smoking use included cigarettes. She has a 45.00 pack-year smoking history. She has never used smokeless tobacco. She reports that she does not currently use alcohol. She reports that she does not use drugs.  Allergies: Allergies  Allergen Reactions   Iodine Shortness Of Breath   Keflex [Cephalexin] Shortness Of Breath, Rash and Tinitus   Shellfish Allergy Hives   Shellfish-Derived Products Anaphylaxis and Hives   Hydrocodone Hives and Itching    Itching throat   Lasix [Furosemide] Itching   Rosuvastatin Other (See Comments)    Muscle Pain in Thighs   Latex Rash   Lisinopril Cough   Sulfa Antibiotics Nausea Only   Tramadol Itching    Family History:  Family History  Problem Relation Age of Onset   Arthritis Mother    Breast cancer Mother 60   Schizophrenia Mother    Diabetes Father    Heart disease Father    Kidney disease Father    Stroke Sister    Lung cancer Brother        dx > 24; work-related exposures   Heart disease Brother    Heart disease Brother    Vision loss Brother        Glaucoma   Alcohol abuse  Brother    Lung cancer Maternal Aunt        dx > 54   Colon cancer Paternal Aunt        dx > 62   Head & neck cancer Paternal Uncle        dx 8s   Ovarian cancer Daughter 58   Breast cancer Niece 49   Leukemia Cousin        d. 43s     Current Outpatient Medications:    Accu-Chek Softclix Lancets lancets, 1 each by Other route daily. Dx e 11.9, Disp: 100 each, Rfl: 12   albuterol (VENTOLIN HFA) 108 (90 Base) MCG/ACT inhaler, INHALE ONE OR TWO PUFFS BY MOUTH INTO THE LUNGS EVERY SIX HOURS AS NEEDED FOR WHEEZING OR SHORTNESS OF BREATH, Disp: 18 g, Rfl: 2   amoxicillin-clavulanate (AUGMENTIN) 875-125 MG tablet, Take 1 tablet by mouth 2 (two) times  daily., Disp: 20 tablet, Rfl: 0   atorvastatin (LIPITOR) 20 MG tablet, TAKE 1 TABLET EVERY DAY (Patient taking differently: Take 20 mg by mouth at bedtime.), Disp: 90 tablet, Rfl: 1   Blood Glucose Monitoring Suppl (ACCU-CHEK GUIDE ME) w/Device KIT, 1 each by Does not apply route daily., Disp: 1 kit, Rfl: 2   Continuous Blood Gluc Receiver (FREESTYLE LIBRE 3 READER) DEVI, 1 each by Does not apply route daily., Disp: 1 each, Rfl: 3   Continuous Blood Gluc Sensor (FREESTYLE LIBRE 3 SENSOR) MISC, PLACE 1 SENSOR ON THE SKIN EVERY 14 DAYS, CHECK GLUCOSE CONTINUOUSLY, Disp: 2 each, Rfl: 6   EPINEPHRINE 0.3 mg/0.3 mL IJ SOAJ injection, Inject 0.3 mg into the muscle as needed for anaphylaxis, Disp: 2 each, Rfl: 0   estradiol-norethindrone (ACTIVELLA) 1-0.5 MG tablet, TAKE ONE TABLET BY MOUTH ONE TIME DAILY, Disp: 28 tablet, Rfl: 0   ezetimibe (ZETIA) 10 MG tablet, TAKE 1 TABLET EVERY DAY, Disp: 90 tablet, Rfl: 1   famotidine (PEPCID) 20 MG tablet, TAKE 1 TABLET BY MOUTH TWICE A DAY (Patient taking differently: Take 20 mg by mouth 2 (two) times daily.), Disp: 180 tablet, Rfl: 1   furosemide (LASIX) 20 MG tablet, TAKE 1 TABLET BY MOUTH EVERY DAY AS NEEDED FOR LEG SWELLING, Disp: 90 tablet, Rfl: 1   glucose blood (ACCU-CHEK GUIDE) test strip, 1 each by Other  route daily. Use as instructed, Disp: 100 each, Rfl: 12   HYDROcodone bit-homatropine (HYCODAN) 5-1.5 MG/5ML syrup, Take 5 mLs by mouth every 4 (four) hours as needed for cough., Disp: 240 mL, Rfl: 0   levothyroxine (SYNTHROID) 150 MCG tablet, TAKE 1 TABLET EVERY DAY, Disp: 90 tablet, Rfl: 1   lidocaine-prilocaine (EMLA) cream, Apply 1 application. topically as needed., Disp: 30 g, Rfl: 0   losartan-hydrochlorothiazide (HYZAAR) 50-12.5 MG tablet, TAKE 1 TABLET EVERY DAY (Patient taking differently: Take 1 tablet by mouth daily.), Disp: 90 tablet, Rfl: 1   XIIDRA 5 % SOLN, Place 1 drop into both eyes 2 (two) times daily as needed (dry eyes)., Disp: , Rfl:   Review of Systems:  Negative unless indicated in HPI.   Physical Exam: Vitals:   09/20/22 1138  BP: 130/70  Pulse: 80  Temp: 98.9 F (37.2 C)  TempSrc: Oral  SpO2: 98%  Weight: 270 lb 4.8 oz (122.6 kg)    Body mass index is 44.3 kg/m.   Physical Exam Vitals reviewed.  Constitutional:      Appearance: Normal appearance.  HENT:     Head: Normocephalic and atraumatic.  Eyes:     Conjunctiva/sclera: Conjunctivae normal.     Pupils: Pupils are equal, round, and reactive to light.  Abdominal:     General: Bowel sounds are normal.     Palpations: Abdomen is soft.     Tenderness: There is abdominal tenderness in the left upper quadrant.  Skin:    General: Skin is warm and dry.  Neurological:     General: No focal deficit present.     Mental Status: She is alert and oriented to person, place, and time.  Psychiatric:        Mood and Affect: Mood normal.        Behavior: Behavior normal.        Thought Content: Thought content normal.        Judgment: Judgment normal.      Impression and Plan:  LUQ pain - Plan: CBC with Differential/Platelet, Comprehensive metabolic panel, Lipase, Urinalysis with Reflex  Microscopic  Generalized abdominal pain - Plan: CANCELED: POCT Urinalysis Dipstick (Automated)  -Abdominal exam is  not impressive other than point tenderness to the left upper quadrant with palpation. -Check labs including lipase and urine analysis given her history.  Further workup to follow.  Time spent:31 minutes reviewing chart, interviewing and examining patient and formulating plan of care.     [name], MD [place] [facility]

## 2022-09-20 NOTE — Addendum Note (Signed)
Addended by: Westley Hummer B on: 09/20/2022 12:58 PM   Modules accepted: Orders

## 2022-09-21 ENCOUNTER — Encounter: Payer: Self-pay | Admitting: Internal Medicine

## 2022-09-22 LAB — URINALYSIS, ROUTINE W REFLEX MICROSCOPIC
Bilirubin Urine: NEGATIVE
Ketones, ur: NEGATIVE
Leukocytes,Ua: NEGATIVE
Nitrite: NEGATIVE
Specific Gravity, Urine: 1.015 (ref 1.000–1.030)
Total Protein, Urine: NEGATIVE
Urine Glucose: NEGATIVE
Urobilinogen, UA: 0.2 (ref 0.0–1.0)
pH: 6 (ref 5.0–8.0)

## 2022-09-23 ENCOUNTER — Other Ambulatory Visit: Payer: Self-pay | Admitting: Internal Medicine

## 2022-09-23 ENCOUNTER — Encounter: Payer: Self-pay | Admitting: Internal Medicine

## 2022-09-23 ENCOUNTER — Ambulatory Visit (INDEPENDENT_AMBULATORY_CARE_PROVIDER_SITE_OTHER): Payer: Medicare HMO | Admitting: Internal Medicine

## 2022-09-23 VITALS — BP 132/62 | HR 80 | Temp 98.1°F | Wt 269.0 lb

## 2022-09-23 DIAGNOSIS — R1012 Left upper quadrant pain: Secondary | ICD-10-CM

## 2022-09-23 DIAGNOSIS — E559 Vitamin D deficiency, unspecified: Secondary | ICD-10-CM

## 2022-09-23 MED ORDER — VITAMIN D (ERGOCALCIFEROL) 1.25 MG (50000 UNIT) PO CAPS: 50000.0000 [IU] | ORAL_CAPSULE | ORAL | 0 refills | Status: AC

## 2022-09-23 NOTE — Progress Notes (Signed)
Established Patient Office Visit     CC/Reason for Visit: Follow-up left upper quadrant pain  HPI: Mahalie Kanner is a 77 y.o. female who is coming in today for the above mentioned reasons.  She was seen just a few days ago on January 29 with left upper quadrant pain.  Pain is described as a constant ache that sometimes throbs.  There is no change in bowel movements, no urinary symptoms.  Lab work done subsequent to that including CBC, CMP, lipase was essentially within normal limits.  She is here today with continued pain.  She has a history of lung cancer as well as high-grade urothelial cancer.  She has an appointment with her urologist next week.  She tells me that she is also scheduled to see oncology soon with plans for a pan CT.   Past Medical/Surgical History: Past Medical History:  Diagnosis Date   Anemia associated with chemotherapy    followed by dr Alen Blew   Carcinoma of renal pelvis, right [facility])    urologist-- dr winter/  oncologist-- dr Alen Blew;   dx 08/ 2021 ;  chemo 10/ 2021  to 12/ 2021 and complete laser tumor ablation;  recurrent 05/ 2023   completed 6 mitomyocin gel infusions via nephrostomy tube 08/ 2023   Chronic kidney disease, stage 3a ([place]) 12/31/2019   Diabetes mellitus type 2, diet-controlled ([place])    followed by pcp;    (05-25-2022  per pt check blood sugar multiple times daily w/ Libre,  fasting average 60-100)   Dyspnea on exertion    05-25-2022  pt stated sob w/ long distancewalk and stairs but recovers quickly when stop/ rest,  ok with household chores and short walks   Family history of breast cancer 05/04/2021   Family history of ovarian cancer 05/04/2021   Generalized weakness    GERD (gastroesophageal reflux disease)    History of basal cell carcinoma ([place]) excision    per pt in 1970s on nose   History of chemotherapy    06-13-2020  to 12/ 2021  for right renal pelvis carcinoma   History of colon polyps    History of radiation therapy     11-13-2020 to 11-26-2020---   Right Lung- SBRT- Dr. Gery Pray   HLD (hyperlipidemia)    Hypertension    followed by pcp   Hypothyroidism, postsurgical 1979   followed by pcp   Leukocytosis    Nocturia    OA (osteoarthritis)    PONV (postoperative nausea and vomiting)    Since chemo, ponv   Port-A-Cath in place    Primary adenocarcinoma of upper lobe of right lung [facility])    oncologist--- shadad/  pulmonology-- dr byrum;  dx 02/ 2022;  s/p  SBRT 11-13-2020 to 11-26-2020   Urgency of urination    wears pads   Varicose veins of both legs with edema    Wears partial dentures    lower    Past Surgical History:  Procedure Laterality Date   COLONOSCOPY  last one 2017   CYSTOSCOPY W/ URETERAL STENT PLACEMENT Right 01/27/2022   Procedure: CYSTOSCOPY WITH RETROGRADE PYELOGRAM/ URETEROSCOPY/POSSIBLE BIOPSY/ POSSIBLE  URETERAL STENT PLACEMENT;  Surgeon: Ceasar Mons, MD;  [facility];  Service: Urology;  Laterality: Right;   CYSTOSCOPY WITH BIOPSY Right 09/16/2021   Procedure: CYSTOSCOPY / URETEROSCOPY WITH BIOPSY/bugbee fulgeration/right retrograde;  Surgeon: Ceasar Mons, MD;  Location: [facility];  Service: Urology;  Laterality: Right;   CYSTOSCOPY  WITH URETEROSCOPY Right 04/18/2020   Procedure: CYSTOSCOPY WITH URETEROSCOPY, BIOPSY;  Surgeon: Ceasar Mons, MD;  Location: [facility];  Service: Urology;  Laterality: Right;  ONLY NEEDS 45 MIN   CYSTOSCOPY WITH URETEROSCOPY Right 06/02/2022   Procedure: CYSTOSCOPY WITH URETEROSCOPY;  Surgeon: Ceasar Mons, MD;  Location: [facility];  Service: Urology;  Laterality: Right;  ONLY NEEDS 60 MINS   CYSTOSCOPY WITH URETEROSCOPY AND STENT PLACEMENT N/A 12/31/2020   Procedure: CYSTOSCOPY WITH RIGHT URETEROSCOPY/ BILATERAL RETROGRADE PYELOGRAM/RIGHT URETERAL BIOPSY/ LASER ABLATION/RIGHT STENT PLACEMENT;  Surgeon: Ceasar Mons, MD;  Location:  [facility];  Service: Urology;  Laterality: N/A;   CYSTOSCOPY/RETROGRADE/URETEROSCOPY Right 04/22/2021   Procedure: CYSTOSCOPY/RETROGRADE/URETEROSCOPY/ STENT PLACEMENT;  Surgeon: Ceasar Mons, MD;  Location: [facility];  Service: Urology;  Laterality: Right;   EYE SURGERY Bilateral 1987   tear ducts   IR IMAGING GUIDED PORT INSERTION  06/04/2020   IR NEPHROSTOMY PLACEMENT RIGHT  03/03/2022   THYROID LOBECTOMY Right 1979   TOTAL HIP ARTHROPLASTY Right 01/28/2016   Procedure: RIGHT TOTAL HIP ARTHROPLASTY ANTERIOR APPROACH;  Surgeon: [name], MD;  Location: [facility];  Service: Orthopedics;  Laterality: Right;   UMBILICAL HERNIA REPAIR  1980s   VIDEO BRONCHOSCOPY WITH ENDOBRONCHIAL NAVIGATION N/A 06/06/2020   Procedure: VIDEO BRONCHOSCOPY WITH ENDOBRONCHIAL NAVIGATION;  Surgeon: Collene Gobble, MD;  Location: [place];  Service: Thoracic;  Laterality: N/A;   VIDEO BRONCHOSCOPY WITH ENDOBRONCHIAL NAVIGATION N/A 10/10/2020   Procedure: VIDEO BRONCHOSCOPY WITH ENDOBRONCHIAL NAVIGATION;  Surgeon: [name], MD;  Location: [place];  Service: Thoracic;  Laterality: N/A;    Social History:  reports that she quit smoking about 2 years ago. Her smoking use included cigarettes. She has a 45.00 pack-year smoking history. She has never used smokeless tobacco. She reports that she does not currently use alcohol. She reports that she does not use drugs.  Allergies: Allergies  Allergen Reactions   Iodine Shortness Of Breath   Keflex [Cephalexin] Shortness Of Breath, Rash and Tinitus   Shellfish Allergy Hives   Shellfish-Derived Products Anaphylaxis and Hives   Hydrocodone Hives and Itching    Itching throat   Lasix [Furosemide] Itching   Rosuvastatin Other (See Comments)    Muscle Pain in Thighs   Latex Rash   Lisinopril Cough   Sulfa Antibiotics Nausea Only   Tramadol Itching    Family History:  Family History  Problem Relation Age of Onset    Arthritis Mother    Breast cancer Mother 51   Schizophrenia Mother    Diabetes Father    Heart disease Father    Kidney disease Father    Stroke Sister    Lung cancer Brother        dx > 27; work-related exposures   Heart disease Brother    Heart disease Brother    Vision loss Brother        Glaucoma   Alcohol abuse Brother    Lung cancer Maternal Aunt        dx > 57   Colon cancer Paternal Aunt        dx > 10   Head & neck cancer Paternal Uncle        dx 19s   Ovarian cancer Daughter 13   Breast cancer Niece 75   Leukemia Cousin        d. 32s     Current Outpatient Medications:    Accu-Chek Softclix Lancets lancets, 1  each by Other route daily. Dx e 11.9, Disp: 100 each, Rfl: 12   albuterol (VENTOLIN HFA) 108 (90 Base) MCG/ACT inhaler, INHALE ONE OR TWO PUFFS BY MOUTH INTO THE LUNGS EVERY SIX HOURS AS NEEDED FOR WHEEZING OR SHORTNESS OF BREATH, Disp: 18 g, Rfl: 2   amoxicillin-clavulanate (AUGMENTIN) 875-125 MG tablet, Take 1 tablet by mouth 2 (two) times daily., Disp: 20 tablet, Rfl: 0   atorvastatin (LIPITOR) 20 MG tablet, TAKE 1 TABLET EVERY DAY (Patient taking differently: Take 20 mg by mouth at bedtime.), Disp: 90 tablet, Rfl: 1   Blood Glucose Monitoring Suppl (ACCU-CHEK GUIDE ME) w/Device KIT, 1 each by Does not apply route daily., Disp: 1 kit, Rfl: 2   Continuous Blood Gluc Receiver (FREESTYLE LIBRE 3 READER) DEVI, 1 each by Does not apply route daily., Disp: 1 each, Rfl: 3   Continuous Blood Gluc Sensor (FREESTYLE LIBRE 3 SENSOR) MISC, PLACE 1 SENSOR ON THE SKIN EVERY 14 DAYS, CHECK GLUCOSE CONTINUOUSLY, Disp: 2 each, Rfl: 6   EPINEPHRINE 0.3 mg/0.3 mL IJ SOAJ injection, Inject 0.3 mg into the muscle as needed for anaphylaxis, Disp: 2 each, Rfl: 0   estradiol-norethindrone (ACTIVELLA) 1-0.5 MG tablet, TAKE ONE TABLET BY MOUTH ONE TIME DAILY, Disp: 28 tablet, Rfl: 0   ezetimibe (ZETIA) 10 MG tablet, TAKE 1 TABLET EVERY DAY, Disp: 90 tablet, Rfl: 1   famotidine (PEPCID)  20 MG tablet, TAKE 1 TABLET BY MOUTH TWICE A DAY (Patient taking differently: Take 20 mg by mouth 2 (two) times daily.), Disp: 180 tablet, Rfl: 1   furosemide (LASIX) 20 MG tablet, TAKE 1 TABLET BY MOUTH EVERY DAY AS NEEDED FOR LEG SWELLING, Disp: 90 tablet, Rfl: 1   glucose blood (ACCU-CHEK GUIDE) test strip, 1 each by Other route daily. Use as instructed, Disp: 100 each, Rfl: 12   HYDROcodone bit-homatropine (HYCODAN) 5-1.5 MG/5ML syrup, Take 5 mLs by mouth every 4 (four) hours as needed for cough., Disp: 240 mL, Rfl: 0   levothyroxine (SYNTHROID) 150 MCG tablet, TAKE 1 TABLET EVERY DAY, Disp: 90 tablet, Rfl: 1   lidocaine-prilocaine (EMLA) cream, Apply 1 application. topically as needed., Disp: 30 g, Rfl: 0   losartan-hydrochlorothiazide (HYZAAR) 50-12.5 MG tablet, TAKE 1 TABLET EVERY DAY (Patient taking differently: Take 1 tablet by mouth daily.), Disp: 90 tablet, Rfl: 1   Vitamin D, Ergocalciferol, (DRISDOL) 1.25 MG (50000 UNIT) CAPS capsule, Take 1 capsule (50,000 Units total) by mouth every 7 (seven) days for 12 doses., Disp: 12 capsule, Rfl: 0   XIIDRA 5 % SOLN, Place 1 drop into both eyes 2 (two) times daily as needed (dry eyes)., Disp: , Rfl:   Review of Systems:  Negative unless indicated in HPI.   Physical Exam: Vitals:   09/23/22 1438  BP: 132/62  Pulse: 80  Temp: 98.1 F (36.7 C)  TempSrc: Oral  SpO2: 96%  Weight: 269 lb (122 kg)    Body mass index is 44.08 kg/m.   Physical Exam Vitals reviewed.  Constitutional:      Appearance: Normal appearance.  HENT:     Head: Normocephalic and atraumatic.  Eyes:     Conjunctiva/sclera: Conjunctivae normal.     Pupils: Pupils are equal, round, and reactive to light.  Abdominal:     General: Abdomen is flat. Bowel sounds are normal.     Palpations: Abdomen is soft.     Tenderness: There is abdominal tenderness in the left upper quadrant.     Comments: Tender only to deep palpation  of LUQ, no guarding or rebound  Skin:     General: Skin is warm and dry.  Neurological:     General: No focal deficit present.     Mental Status: She is alert and oriented to person, place, and time.  Psychiatric:        Mood and Affect: Mood normal.        Behavior: Behavior normal.        Thought Content: Thought content normal.        Judgment: Judgment normal.      Impression and Plan:  LUQ pain  -With recent normal labs, etiology remains unclear.  Have offered to do imaging in the form of CT scan, however she tells me that oncology plans on doing one soon for follow-up of her lung/urothelial cancer.  She will follow-up with them.  Time spent:20 minutes reviewing chart, interviewing and examining patient and formulating plan of care.     [name], MD [facility]

## 2022-09-27 ENCOUNTER — Encounter (HOSPITAL_BASED_OUTPATIENT_CLINIC_OR_DEPARTMENT_OTHER): Payer: Self-pay | Admitting: Urology

## 2022-09-27 NOTE — Progress Notes (Signed)
Spoke w/ via phone for pre-op interview--- pt Lab needs dos----  [employer] results------ current in epic/ chart COVID test -----patient states asymptomatic no test needed Arrive at -------  1030 on 10-01-2022 NPO after MN NO Solid Food.  Clear liquids from MN until--- 0930 Med rec completed Medications to take morning of surgery -----  synthroid Diabetic medication ----- n/a Patient instructed no nail polish to be worn day of surgery Patient instructed to bring photo id and insurance card day of surgery Patient aware to have Driver (ride ) / caregiver    for 24 hours after surgery -- grandson,  chase Patient Special Instructions ----- asked to bring rescue inhaler dos Pre-Op special Istructions -----  pt has Libre 3 , right arm Patient verbalized understanding of instructions that were given at this phone interview. Patient denies shortness of breath, chest pain, fever, cough at this phone interview.

## 2022-09-30 NOTE — Anesthesia Preprocedure Evaluation (Addendum)
Anesthesia Evaluation  Patient identified by MRN, date of birth, ID band Patient awake    Reviewed: Allergy & Precautions, H&P , NPO status , Patient's Chart, lab work & pertinent test results  History of Anesthesia Complications (+) PONV and history of anesthetic complications  Airway Mallampati: II  TM Distance: >3 FB Neck ROM: Full    Dental no notable dental hx. (+) Partial Upper, Partial Lower, Dental Advisory Given, Missing   Pulmonary neg pulmonary ROS, COPD,  COPD inhaler, former smoker RUL adenocarcinoma: XRT   Pulmonary exam normal breath sounds clear to auscultation       Cardiovascular hypertension, Pt. on medications negative cardio ROS Normal cardiovascular exam Rhythm:Regular Rate:Normal     Neuro/Psych  PSYCHIATRIC DISORDERS  Depression    negative neurological ROS  negative psych ROS   GI/Hepatic negative GI ROS, Neg liver ROS,GERD  Controlled and Medicated,,  Endo/Other  negative endocrine ROSdiabetesHypothyroidism  Morbid obesity  Renal/GU Renal InsufficiencyRenal diseasenegative Renal ROSLab Results      Component                Value               Date                      CREATININE               1.31 (H)            09/20/2022                BUN                      25 (H)              09/20/2022                NA                       137                 09/20/2022                K                        4.1                 09/20/2022                  negative genitourinary   Musculoskeletal negative musculoskeletal ROS (+) Arthritis , Osteoarthritis,    Abdominal  (+) + obese  Peds negative pediatric ROS (+)  Hematology negative hematology ROS (+) Lab Results      Component                Value               Date                      WBC                      9.2                 09/20/2022                HGB  13.1                09/20/2022                HCT                       39.5                09/20/2022                MCV                      90.6                09/20/2022                PLT                      271.0               09/20/2022              Anesthesia Other Findings All: latex see list  Reproductive/Obstetrics negative OB ROS                             Anesthesia Physical Anesthesia Plan  ASA: 3  Anesthesia Plan: General   Post-op Pain Management: Minimal or no pain anticipated, Tylenol PO (pre-op)* and Precedex   Induction: Intravenous  PONV Risk Score and Plan: 4 or greater and Ondansetron, Treatment may vary due to age or medical condition, Dexamethasone, TIVA and Propofol infusion  Airway Management Planned: LMA  Additional Equipment: None  Intra-op Plan:   Post-operative Plan: Extubation in OR  Informed Consent: I have reviewed the patients History and Physical, chart, labs and discussed the procedure including the risks, benefits and alternatives for the proposed anesthesia with the patient or authorized representative who has indicated his/her understanding and acceptance.     Dental advisory given  Plan Discussed with: CRNA  Anesthesia Plan Comments: (TIVA GA)        Anesthesia Quick Evaluation

## 2022-10-01 ENCOUNTER — Ambulatory Visit (HOSPITAL_BASED_OUTPATIENT_CLINIC_OR_DEPARTMENT_OTHER)
Admission: RE | Admit: 2022-10-01 | Discharge: 2022-10-01 | Disposition: A | Discharge status: Home / Self Care | Payer: Medicare HMO | Attending: Urology | Admitting: Urology

## 2022-10-01 ENCOUNTER — Encounter (HOSPITAL_BASED_OUTPATIENT_CLINIC_OR_DEPARTMENT_OTHER): Payer: Self-pay | Admitting: Urology

## 2022-10-01 ENCOUNTER — Ambulatory Visit (HOSPITAL_BASED_OUTPATIENT_CLINIC_OR_DEPARTMENT_OTHER): Payer: Medicare HMO | Admitting: Anesthesiology

## 2022-10-01 ENCOUNTER — Other Ambulatory Visit: Payer: Self-pay

## 2022-10-01 ENCOUNTER — Encounter (HOSPITAL_BASED_OUTPATIENT_CLINIC_OR_DEPARTMENT_OTHER)
Admission: RE | Disposition: A | Discharge status: Home / Self Care | Payer: Self-pay | Source: Home / Self Care | Attending: Urology

## 2022-10-01 DIAGNOSIS — Z9221 Personal history of antineoplastic chemotherapy: Secondary | ICD-10-CM | POA: Insufficient documentation

## 2022-10-01 DIAGNOSIS — Z8553 Personal history of malignant neoplasm of renal pelvis: Secondary | ICD-10-CM | POA: Diagnosis not present

## 2022-10-01 DIAGNOSIS — C651 Malignant neoplasm of right renal pelvis: Secondary | ICD-10-CM | POA: Diagnosis not present

## 2022-10-01 DIAGNOSIS — Z6841 Body Mass Index (BMI) 40.0 and over, adult: Secondary | ICD-10-CM | POA: Diagnosis not present

## 2022-10-01 DIAGNOSIS — R310 Gross hematuria: Secondary | ICD-10-CM | POA: Insufficient documentation

## 2022-10-01 DIAGNOSIS — Z923 Personal history of irradiation: Secondary | ICD-10-CM | POA: Diagnosis not present

## 2022-10-01 DIAGNOSIS — C3490 Malignant neoplasm of unspecified part of unspecified bronchus or lung: Secondary | ICD-10-CM | POA: Insufficient documentation

## 2022-10-01 DIAGNOSIS — K219 Gastro-esophageal reflux disease without esophagitis: Secondary | ICD-10-CM | POA: Insufficient documentation

## 2022-10-01 DIAGNOSIS — I10 Essential (primary) hypertension: Secondary | ICD-10-CM | POA: Insufficient documentation

## 2022-10-01 DIAGNOSIS — M1990 Unspecified osteoarthritis, unspecified site: Secondary | ICD-10-CM | POA: Diagnosis not present

## 2022-10-01 DIAGNOSIS — F32A Depression, unspecified: Secondary | ICD-10-CM | POA: Insufficient documentation

## 2022-10-01 DIAGNOSIS — D4959 Neoplasm of unspecified behavior of other genitourinary organ: Secondary | ICD-10-CM

## 2022-10-01 DIAGNOSIS — J449 Chronic obstructive pulmonary disease, unspecified: Secondary | ICD-10-CM | POA: Insufficient documentation

## 2022-10-01 DIAGNOSIS — Z01818 Encounter for other preprocedural examination: Secondary | ICD-10-CM

## 2022-10-01 DIAGNOSIS — Z87891 Personal history of nicotine dependence: Secondary | ICD-10-CM | POA: Diagnosis not present

## 2022-10-01 DIAGNOSIS — E119 Type 2 diabetes mellitus without complications: Secondary | ICD-10-CM | POA: Insufficient documentation

## 2022-10-01 DIAGNOSIS — E039 Hypothyroidism, unspecified: Secondary | ICD-10-CM | POA: Insufficient documentation

## 2022-10-01 DIAGNOSIS — Z833 Family history of diabetes mellitus: Secondary | ICD-10-CM | POA: Diagnosis not present

## 2022-10-01 DIAGNOSIS — N289 Disorder of kidney and ureter, unspecified: Secondary | ICD-10-CM | POA: Insufficient documentation

## 2022-10-01 DIAGNOSIS — Z803 Family history of malignant neoplasm of breast: Secondary | ICD-10-CM | POA: Diagnosis not present

## 2022-10-01 DIAGNOSIS — C679 Malignant neoplasm of bladder, unspecified: Secondary | ICD-10-CM | POA: Diagnosis not present

## 2022-10-01 DIAGNOSIS — N2889 Other specified disorders of kidney and ureter: Secondary | ICD-10-CM | POA: Diagnosis not present

## 2022-10-01 DIAGNOSIS — C661 Malignant neoplasm of right ureter: Secondary | ICD-10-CM | POA: Insufficient documentation

## 2022-10-01 HISTORY — PX: URETEROSCOPY: SHX842

## 2022-10-01 HISTORY — PX: CYSTOSCOPY WITH STENT PLACEMENT: SHX5790

## 2022-10-01 LAB — GLUCOSE, CAPILLARY: Glucose-Capillary: 134 mg/dL — ABNORMAL HIGH (ref 70–99)

## 2022-10-01 LAB — POCT I-STAT, CHEM 8
BUN: 31 mg/dL — ABNORMAL HIGH (ref 8–23)
Calcium, Ion: 1.22 mmol/L (ref 1.15–1.40)
Chloride: 104 mmol/L (ref 98–111)
Creatinine, Ser: 1.3 mg/dL — ABNORMAL HIGH (ref 0.44–1.00)
Glucose, Bld: 143 mg/dL — ABNORMAL HIGH (ref 70–99)
HCT: 38 % (ref 36.0–46.0)
Hemoglobin: 12.9 g/dL (ref 12.0–15.0)
Potassium: 3.5 mmol/L (ref 3.5–5.1)
Sodium: 142 mmol/L (ref 135–145)
TCO2: 23 mmol/L (ref 22–32)

## 2022-10-01 SURGERY — URETEROSCOPY
Anesthesia: General | Laterality: Right

## 2022-10-01 MED ORDER — PROPOFOL 10 MG/ML IV BOLUS
INTRAVENOUS | Status: DC | PRN
  Administered 2022-10-01: 20 mg via INTRAVENOUS
  Administered 2022-10-01: 150 mg via INTRAVENOUS
  Administered 2022-10-01: 30 mg via INTRAVENOUS

## 2022-10-01 MED ORDER — NITROFURANTOIN MONOHYD MACRO 100 MG PO CAPS: 100.0000 mg | ORAL_CAPSULE | Freq: Two times a day (BID) | ORAL | 0 refills | Status: AC

## 2022-10-01 MED ORDER — CIPROFLOXACIN IN D5W 400 MG/200ML IV SOLN
INTRAVENOUS | Status: AC
  Filled 2022-10-01: qty 200

## 2022-10-01 MED ORDER — CIPROFLOXACIN IN D5W 400 MG/200ML IV SOLN
400.0000 mg | INTRAVENOUS | Status: AC
  Administered 2022-10-01: 400 mg via INTRAVENOUS

## 2022-10-01 MED ORDER — LACTATED RINGERS IV SOLN: INTRAVENOUS | Status: DC

## 2022-10-01 MED ORDER — DEXMEDETOMIDINE HCL IN NACL 80 MCG/20ML IV SOLN
INTRAVENOUS | Status: DC | PRN
  Administered 2022-10-01: 4 ug via BUCCAL
  Administered 2022-10-01: 2 ug via BUCCAL

## 2022-10-01 MED ORDER — EPHEDRINE SULFATE (PRESSORS) 50 MG/ML IJ SOLN
INTRAMUSCULAR | Status: DC | PRN
  Administered 2022-10-01: 5 mg via INTRAVENOUS

## 2022-10-01 MED ORDER — FENTANYL CITRATE (PF) 100 MCG/2ML IJ SOLN
INTRAMUSCULAR | Status: DC | PRN
  Administered 2022-10-01: 50 ug via INTRAVENOUS
  Administered 2022-10-01: 25 ug via INTRAVENOUS

## 2022-10-01 MED ORDER — FENTANYL CITRATE (PF) 100 MCG/2ML IJ SOLN: 25.0000 ug | INTRAMUSCULAR | Status: DC | PRN

## 2022-10-01 MED ORDER — SODIUM CHLORIDE 0.9 % IV SOLN: INTRAVENOUS | Status: DC

## 2022-10-01 MED ORDER — SODIUM CHLORIDE 0.9 % IR SOLN
Status: DC | PRN
  Administered 2022-10-01: 3000 mL via INTRAVESICAL

## 2022-10-01 MED ORDER — ACETAMINOPHEN 10 MG/ML IV SOLN: 1000.0000 mg | Freq: Once | INTRAVENOUS | Status: DC | PRN

## 2022-10-01 MED ORDER — DEXAMETHASONE SODIUM PHOSPHATE 4 MG/ML IJ SOLN
INTRAMUSCULAR | Status: DC | PRN
  Administered 2022-10-01: 4 mg via INTRAVENOUS

## 2022-10-01 MED ORDER — FENTANYL CITRATE (PF) 100 MCG/2ML IJ SOLN
INTRAMUSCULAR | Status: AC
  Filled 2022-10-01: qty 2

## 2022-10-01 MED ORDER — LIDOCAINE HCL (CARDIAC) PF 100 MG/5ML IV SOSY
PREFILLED_SYRINGE | INTRAVENOUS | Status: DC | PRN
  Administered 2022-10-01: 100 mg via INTRAVENOUS

## 2022-10-01 MED ORDER — PROPOFOL 500 MG/50ML IV EMUL
INTRAVENOUS | Status: DC | PRN
  Administered 2022-10-01: 150 ug/kg/min via INTRAVENOUS

## 2022-10-01 MED ORDER — ONDANSETRON HCL 4 MG/2ML IJ SOLN: 4.0000 mg | Freq: Once | INTRAMUSCULAR | Status: DC | PRN

## 2022-10-01 MED ORDER — IOHEXOL 300 MG/ML  SOLN
INTRAMUSCULAR | Status: DC | PRN
  Administered 2022-10-01: 10 mL via URETHRAL

## 2022-10-01 MED ORDER — ONDANSETRON HCL 4 MG/2ML IJ SOLN
INTRAMUSCULAR | Status: DC | PRN
  Administered 2022-10-01: 4 mg via INTRAVENOUS

## 2022-10-01 SURGICAL SUPPLY — 31 items
APL SKNCLS STERI-STRIP NONHPOA (GAUZE/BANDAGES/DRESSINGS)
BAG DRAIN URO-CYSTO SKYTR STRL (DRAIN) ×1 IMPLANT
BAG DRN UROCATH (DRAIN) ×1
BASKET STONE 1.7 NGAGE (UROLOGICAL SUPPLIES) IMPLANT
BASKET ZERO TIP NITINOL 2.4FR (BASKET) ×1 IMPLANT
BENZOIN TINCTURE PRP APPL 2/3 (GAUZE/BANDAGES/DRESSINGS) IMPLANT
BRUSH URET BIOPSY 3F (UROLOGICAL SUPPLIES) IMPLANT
BSKT STON RTRVL ZERO TP 2.4FR (BASKET) ×1
CATH URETL OPEN 5X70 (CATHETERS) IMPLANT
CLOTH BEACON ORANGE TIMEOUT ST (SAFETY) ×1 IMPLANT
ELECT COAG BALL END 3FR (ELECTROSURGICAL) ×1
ELECT COAG MONOPOLAR (ELECTROSURGICAL) ×1
ELECTRODE COAG BALL END 3FR (ELECTROSURGICAL) IMPLANT
ELECTRODE COAG MONOPOLAR (ELECTROSURGICAL) IMPLANT
FIBER LASER FLEXIVA 365 (UROLOGICAL SUPPLIES) IMPLANT
GLOVE BIO SURGEON STRL SZ7.5 (GLOVE) ×1 IMPLANT
GOWN STRL REUS W/TWL XL LVL3 (GOWN DISPOSABLE) ×1 IMPLANT
GUIDEWIRE STR DUAL SENSOR (WIRE) IMPLANT
GUIDEWIRE ZIPWRE .038 STRAIGHT (WIRE) ×1 IMPLANT
IV NS IRRIG 3000ML ARTHROMATIC (IV SOLUTION) ×2 IMPLANT
KIT TURNOVER CYSTO (KITS) ×1 IMPLANT
MANIFOLD NEPTUNE II (INSTRUMENTS) ×1 IMPLANT
NS IRRIG 500ML POUR BTL (IV SOLUTION) ×1 IMPLANT
PACK CYSTO (CUSTOM PROCEDURE TRAY) ×1 IMPLANT
STRIP CLOSURE SKIN 1/2X4 (GAUZE/BANDAGES/DRESSINGS) IMPLANT
SYR 10ML LL (SYRINGE) ×1 IMPLANT
TRACTIP FLEXIVA PULS ID 200XHI (Laser) IMPLANT
TRACTIP FLEXIVA PULSE ID 200 (Laser)
TUBE CONNECTING 12X1/4 (SUCTIONS) IMPLANT
TUBING UROLOGY SET (TUBING) ×1 IMPLANT
coag ball electrode IMPLANT

## 2022-10-01 NOTE — Anesthesia Postprocedure Evaluation (Signed)
Anesthesia Post Note  Patient: Maria Marquez  Procedure(s) Performed: URETEROSCOPY WITH BIOPSY pylegram (Right) CYSTOSCOPY WITH STENT PLACEMENT (Right)     Patient location during evaluation: PACU Anesthesia Type: General Level of consciousness: awake and alert Pain management: pain level controlled Vital Signs Assessment: post-procedure vital signs reviewed and stable Respiratory status: spontaneous breathing, nonlabored ventilation, respiratory function stable and patient connected to nasal cannula oxygen Cardiovascular status: blood pressure returned to baseline and stable Postop Assessment: no apparent nausea or vomiting Anesthetic complications: no  No notable events documented.  Last Vitals:  Vitals:   10/01/22 1412 10/01/22 1450  BP:  138/83  Pulse: 67 63  Resp: (!) 23 (!) 21  Temp:    SpO2: 96% 99%    Last Pain:  Vitals:   10/01/22 1102  TempSrc: Oral  PainSc: 0-No pain                 Barnet Glasgow

## 2022-10-01 NOTE — Transfer of Care (Signed)
Immediate Anesthesia Transfer of Care Note  Patient: Maria Marquez  Procedure(s) Performed: URETEROSCOPY WITH BIOPSY pylegram (Right) CYSTOSCOPY WITH STENT PLACEMENT (Right)  Patient Location: PACU  Anesthesia Type:General  Level of Consciousness: awake, alert , oriented, and patient cooperative  Airway & Oxygen Therapy: Patient Spontanous Breathing and Patient connected to nasal cannula oxygen  Post-op Assessment: Report given to RN and Post -op Vital signs reviewed and stable  Post vital signs: Reviewed and stable  Last Vitals:  Vitals Value Taken Time  BP 125/57 10/01/22 1345  Temp    Pulse 69 10/01/22 1346  Resp 14 10/01/22 1346  SpO2 95 % 10/01/22 1346  Vitals shown include unvalidated device data.  Last Pain:  Vitals:   10/01/22 1102  TempSrc: Oral  PainSc: 0-No pain      Patients Stated Pain Goal: 5 (33/38/32 9191)  Complications: No notable events documented.

## 2022-10-01 NOTE — H&P (Signed)
Office Visit Report     09/06/2022   --------------------------------------------------------------------------------   Maria Marquez  MRN: [id number]  DOB: 13-Apr-1946, 77 year old Female  SSN:    PRIMARY CARE:  Maria Marquez. Maria Bliss, MD  PRIMARY CARE FAX:  [phone]  REFERRING:  Maria Marquez. Maria Neighbours, MD  PROVIDER:  Ellison Marquez, M.D.  LOCATION:  [facility] [phone]     --------------------------------------------------------------------------------   CC/HPI: CC: Right ureteral lesion   HPI: Maria Marquez is a 77 year old female, referred by Dr. Matilde Marquez after she was found to have a 2.7 cm solid and enhancing lesion in the proximal aspects of the right ureter on CT during a work-up for gross hematuria. She was also noted to have a 13 mm left renal lesion, likely representing a proteinaceous/hyperdense cyst. She is s/p diagnostic right ureteroscopy on 04/18/20 and was found to have multiple large papillary tumors throughout the right renal pelvis/UPJ. 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 She is s/p gem/cis chemotherapy and radiation to the thorax. Negative cysto/ureteroscopy on 09/16/21. She underwent surveillance cystoscopy/ureteroscopy on 01/27/2022 and was found to have multiple small papillary bladder tumors within the right renal pelvis, right ureter and bladder. All identifiable tumor burden was fulgurated at that time. 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   11/24/20: The patient is here today for a routine follow-up. She has completed chemotherapy and is near completion of radiation therapy for her primary lung cancer. PET CT from January revealed no intra-abdominal lesions and no GU abnormalities. She is urinating without difficulty and denies interval UTIs, dysuria or hematuria. Of note, since starting  chemotherapy, her renal function has progressively declined with her last eGFR being 38 (serum creatinine- 1.45).   01/16/21: The patient is here today for a routine follow-up and stent removal. She is s/p right URS with ablation of a 1-2 cm papillary UCC on 12/31/20. Pathology showed low grade, detached UCC. She has done well post-op with her only complaint being urinary urgency and frequency q 2 hours, which were present months prior to surgery. She denies interval UTIs, fever/chills or significant flank pain.   07/23/21: The patient is here today for a routine follow-up and to plan her next surveillance ureteroscopy. Doing well and reports no new major health issues. Urge incontinence has worsened--wearing 3-4 ppd. Frequency q 2-2.5 hours. Minimal SUI sxs. Denies interval UTIs, dysuria or hematuria.   09/02/2021: 77 year old female who presents today for preoperative appointment. She denies any changes to her voiding habits, shortness of breath, chest pain, changes to her medications. She has been having frequent nosebleeds and does endorse that they can be quite severe at times. She has not seen an ENT for this. She reports her blood pressures have been at her normal.. She has no questions today regarding her upcoming ureteroscopy. She denies interval UTIs, dysuria, hematuria.   12/17/21: The patient is here today for a routine follow-up. She denies interval episodes of hematuria or dysuria. She does report some urinary hesitancy, but is not bothered by it. Her daughter is currently being treated for refractory metastatic ovarian cancer and this is weighing heavily on her.   02/03/2022: The patient is here today for cystoscopy and right ureteral stent removal following fulguration of her right renal pelvis and ureteral tumor burden. She has done well since surgery denies fever/chills or general malaise. I spoke with  the GU tumor board who recommended continued local therapy and deferred nephroureterectomy,  especially given her poor renal function.   04/29/22: The patient is here today after completing a 6-week course of right renal pelvis Jelmyto infusions, which she tolerated well. She reports stable, nonbothersome right flank discomfort around her tube insertion site. She denies interval episodes of dysuria or hematuria.   09/06/22: The patient is here today for a routine follow-up. She is recovering from a bout of laryngitis, but states that she is improving. She is doing well and denies interval episodes of flank pain.     ALLERGIES: Cephalexin IVP Dye - Trouble Breathing, Other Reaction, Tightness in throat Shellfish    MEDICATIONS: Simvastatin 10 mg tablet  Estradiol-Norethindrone Acetat 1 mg-0.5 mg tablet  Ezetimibe 10 mg tablet  Levothyroxine Sodium 137 mcg capsule  Losartan-Hydrochlorothiazide 50 mg-12.5 mg tablet     GU PSH: Cysto Bladder Ureth Biopsy - 01/27/2022, 09/16/2021 Cysto Remove Stent FB Sim - 02/03/2022, 01/16/2021 Cysto Uretero Biopsy Fulgura, Right - 04/18/2020 Cysto Uretero W/excise Tumor, Right - 12/31/2020 Cystoscopy - 03/21/2020 Cystoscopy Insert Stent - 01/27/2022 Cystoscopy Ureteroscopy - 06/02/2022, 09/16/2021, 04/22/2021 Instll Rx Agnt Into Rnal Tub - 04/14/2022, 04/07/2022, 03/31/2022, 03/24/2022, 03/17/2022, 03/10/2022 Locm 300-399Mg /Ml Iodine,1Ml - 03/19/2020     NON-GU PSH: Hip Replacement, Right - 2017 Thyroid Surgery, thyroid lobe removal - 1979     GU PMH: Renal pelvis cancer, right - 04/29/2022, - 04/14/2022, - 04/07/2022, - 03/24/2022, - 03/17/2022, - 03/10/2022, - 02/17/2022, - 02/03/2022, - 12/17/2021, - 09/02/2021, - 07/23/2021, - 04/13/2021, - 01/16/2021, - 05/14/2020, - 04/25/2020 Bladder Cancer Trigone - 04/14/2022, - 03/24/2022, - 03/17/2022, - 02/17/2022, - 02/03/2022 Right renal neoplasm - 03/31/2022, - 11/24/2020, - 04/03/2020 Urge incontinence - 07/23/2021 Microscopic hematuria - 04/13/2021 Nocturia (Stable) - 01/16/2021, - 03/18/2020 Urinary Urgency - 01/16/2021 Chronic kidney  disease stage 3 (GFR 30-60) - 11/24/2020 Gross hematuria - 04/25/2020, (Stable), - 04/03/2020, - 03/19/2020 Hemorrhagic cystitis - 03/18/2020 Urinary Frequency - 03/18/2020    NON-GU PMH: Arthritis GERD Hypercholesterolemia Hypertension Hypothyroidism    FAMILY HISTORY: 1 Daughter - Daughter 1 son - Son Breast Cancer - Mother Diabetes - Father father deceased - Father mother deceased - Mother    Notes: Mother deceased at age 24 d/t breast cancer  Father deceased at age 43 d/t diabetes   SOCIAL HISTORY: Marital Status: Widowed Current Smoking Status: Patient smokes. Has smoked since 02/20/1990. Smokes 1/2 pack per day.   Tobacco Use Assessment Completed: Used Tobacco in last 30 days? Does not drink anymore.  Drinks 2 caffeinated drinks per day.    REVIEW OF SYSTEMS:    GU Review Female:   Patient denies frequent urination, hard to postpone urination, burning /pain with urination, get up at night to urinate, leakage of urine, stream starts and stops, trouble starting your stream, have to strain to urinate, and being pregnant.  Gastrointestinal (Upper):   Patient denies nausea, vomiting, and indigestion/ heartburn.  Gastrointestinal (Lower):   Patient denies diarrhea and constipation.  Constitutional:   Patient denies fever, night sweats, weight loss, and fatigue.  Skin:   Patient denies skin rash/ lesion and itching.  Eyes:   Patient denies blurred vision and double vision.  Ears/ Nose/ Throat:   Patient denies sore throat and sinus problems.  Hematologic/Lymphatic:   Patient denies swollen glands and easy bruising.  Cardiovascular:   Patient denies leg swelling and chest pains.  Respiratory:   Patient denies cough and shortness of breath.  Endocrine:  Patient denies excessive thirst.  Musculoskeletal:   Patient denies back pain and joint pain.  Neurological:   Patient denies headaches and dizziness.  Psychologic:   Patient denies depression and anxiety.   VITAL SIGNS:       09/06/2022 03:39 PM  Weight 273 lb / 123.83 kg  Height 66 in / 167.64 cm  BP 133/77 mmHg  Pulse 73 /min  Temperature 98.0 F / 36.6 C  BMI 44.1 kg/m   MULTI-SYSTEM PHYSICAL EXAMINATION:    Constitutional: Well-nourished. No physical deformities. Normally developed. Good grooming.  Neurologic / Psychiatric: Oriented to time, oriented to place, oriented to person. No depression, no anxiety, no agitation.     Complexity of Data:  Records Review:   Pathology Reports, Previous Patient Records   PROCEDURES:          Urinalysis w/Scope Dipstick Dipstick Cont'd Micro  Color: Yellow Bilirubin: Neg mg/dL WBC/hpf: NS (Not Seen)  Appearance: Clear Ketones: Trace mg/dL RBC/hpf: 3 - 10/hpf  Specific Gravity: 1.025 Blood: 2+ ery/uL Bacteria: NS (Not Seen)  pH: 5.5 Protein: Neg mg/dL Cystals: NS (Not Seen)  Glucose: Neg mg/dL Urobilinogen: 1.0 mg/dL Casts: NS (Not Seen)    Nitrites: Neg Trichomonas: Not Present    Leukocyte Esterase: Neg leu/uL Mucous: Not Present      Epithelial Cells: 0 - 5/hpf      Yeast: NS (Not Seen)      Sperm: Not Present    Notes: Micro performed on unspun urine due to QNS    ASSESSMENT:      ICD-10 Details  1 GU:   Renal pelvis cancer, 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 Risks discussed.

## 2022-10-01 NOTE — Discharge Instructions (Signed)

## 2022-10-01 NOTE — Anesthesia Procedure Notes (Signed)
Procedure Name: LMA Insertion Date/Time: 10/01/2022 12:49 PM  Performed by: Georgeanne Nim, CRNAPre-anesthesia Checklist: Patient identified, Emergency Drugs available, Suction available, Patient being monitored and Timeout performed Patient Re-evaluated:Patient Re-evaluated prior to induction Oxygen Delivery Method: Circle system utilized Preoxygenation: Pre-oxygenation with 100% oxygen Induction Type: IV induction LMA: LMA inserted LMA Size: 4.0 Number of attempts: 1 Placement Confirmation: positive ETCO2 and breath sounds checked- equal and bilateral Tube secured with: Tape Dental Injury: Teeth and Oropharynx as per pre-operative assessment

## 2022-10-01 NOTE — Op Note (Signed)
Operative Note  Preoperative diagnosis:  1.  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  History of high-grade urothelial carcinoma right renal pelvis 2.  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  Each tumor measures approximately 2 mm in greatest dimension  Procedure(s): 1.  Cystoscopy with right ureteroscopy, ureteral biopsy and ureteral tumor fulguration 2.  Bilateral retrograde pyelograms with intraoperative interpretation fluoroscopic imaging  Surgeon: Ellison Hughs, MD  Assistants:  None  Anesthesia:  General  Complications:  None  EBL: Less than 5 mL  Specimens: 1.  Brush biopsy of right distal ureteral tumor   Drains/Catheters: 1.  Right 6 French, 24 cm JJ stent without tether  Intraoperative findings:   Solitary right collecting system with no filling defects or dilation involving the right ureter or right renal pelvis seen on retrograde pyelogram Solitary left collecting system with no filling defects or dilation involving the left ureter or left renal pelvis seen on retrograde pyelogram Flexible ureteroscopy revealed 2 mm papillary bladder tumors within the distal and proximal right ureter.  No evidence of right renal pelvis or bladder tumor recurrence  Indication:  Maria Marquez is a 77 y.o. 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  She recently went through 6-week course of Jelmyto and is here today for a surveillance ureteroscopy.  She has been consented for the above procedures, voices understanding wishes to proceed.  Description of procedure:  After informed consent was obtained, the patient was brought to the operating room and general LMA anesthesia was administered. The patient was then placed in the dorsolithotomy position and prepped and draped in the usual sterile fashion. A timeout was performed. A 23  French rigid cystoscope was then inserted into the urethral meatus and advanced into the bladder under direct vision. A complete bladder survey revealed no intravesical pathology.  A 5 French ureteral catheter was then inserted into the left ureteral orifice and a retrograde pyelogram was obtained, with the findings listed above.  A Glidewire was then used to intubate the lumen of the ureteral catheter and was advanced up to the left renal pelvis, under fluoroscopic guidance.  The catheter was then removed.  A 5 French ureteral catheter was then inserted into the right ureteral orifice and a retrograde pyelogram was obtained, with the findings listed above.  A Glidewire was then used to intubate the lumen of the ureteral catheter and was advanced up to the right renal pelvis, under fluoroscopic guidance.  The catheter was then removed, leaving the wire in place.  A flexible ureteroscope was then advanced up the right ureter where 2 separate, 2 mm papillary tumors were identified within the distal and proximal aspects of the right ureter.  A complete survey of the right renal pelvis and all associated calyces revealed no other mucosal abnormalities.  I attempted to obtain a cold cup biopsy of the right distal ureter, but could not obtain enough tissue purchase for sufficient biopsy.  A brush biopsy was then obtained and sent for pathologic analysis.  The 2 tumors were then extensively fulgurated until they were completely obliterated.  There was no significant bleeding following biopsy/fulguration.  Flexible ureteroscope was then removed.  A 6 French, 24 cm JJ stent was then placed over the wire and into good position within the right collecting system, confirmed placement via fluoroscopy.  Her bladder was then drained.  She tolerated the procedure well  and was transferred to the postanesthesia in stable condition.  Plan: Follow-up on 10/15/2022 for office cystoscopy and stent removal and to discuss  pathology results.

## 2022-10-02 ENCOUNTER — Other Ambulatory Visit: Payer: Self-pay | Admitting: Internal Medicine

## 2022-10-02 DIAGNOSIS — K219 Gastro-esophageal reflux disease without esophagitis: Secondary | ICD-10-CM

## 2022-10-04 ENCOUNTER — Encounter (HOSPITAL_BASED_OUTPATIENT_CLINIC_OR_DEPARTMENT_OTHER): Payer: Self-pay | Admitting: Urology

## 2022-10-04 ENCOUNTER — Encounter: Payer: Self-pay | Admitting: Internal Medicine

## 2022-10-04 LAB — SURGICAL PATHOLOGY

## 2022-10-06 ENCOUNTER — Ambulatory Visit: Payer: Medicare HMO | Admitting: Hematology and Oncology

## 2022-10-06 ENCOUNTER — Other Ambulatory Visit: Payer: Medicare HMO

## 2022-10-07 ENCOUNTER — Encounter: Payer: Self-pay | Admitting: Internal Medicine

## 2022-10-07 ENCOUNTER — Inpatient Hospital Stay: Payer: Medicare HMO | Attending: Oncology

## 2022-10-07 ENCOUNTER — Inpatient Hospital Stay (HOSPITAL_BASED_OUTPATIENT_CLINIC_OR_DEPARTMENT_OTHER): Payer: Medicare HMO | Admitting: Hematology and Oncology

## 2022-10-07 ENCOUNTER — Inpatient Hospital Stay: Payer: Medicare HMO

## 2022-10-07 ENCOUNTER — Ambulatory Visit (INDEPENDENT_AMBULATORY_CARE_PROVIDER_SITE_OTHER): Payer: Medicare HMO | Admitting: Internal Medicine

## 2022-10-07 VITALS — BP 135/51 | HR 78 | Temp 98.1°F | Wt 274.2 lb

## 2022-10-07 VITALS — BP 152/62 | HR 79 | Temp 98.1°F | Resp 14 | Wt 272.4 lb

## 2022-10-07 DIAGNOSIS — C3490 Malignant neoplasm of unspecified part of unspecified bronchus or lung: Secondary | ICD-10-CM | POA: Diagnosis not present

## 2022-10-07 DIAGNOSIS — Z8553 Personal history of malignant neoplasm of renal pelvis: Secondary | ICD-10-CM | POA: Diagnosis not present

## 2022-10-07 DIAGNOSIS — Z08 Encounter for follow-up examination after completed treatment for malignant neoplasm: Secondary | ICD-10-CM | POA: Insufficient documentation

## 2022-10-07 DIAGNOSIS — Z452 Encounter for adjustment and management of vascular access device: Secondary | ICD-10-CM | POA: Insufficient documentation

## 2022-10-07 DIAGNOSIS — E1169 Type 2 diabetes mellitus with other specified complication: Secondary | ICD-10-CM | POA: Diagnosis not present

## 2022-10-07 DIAGNOSIS — I10 Essential (primary) hypertension: Secondary | ICD-10-CM | POA: Diagnosis not present

## 2022-10-07 DIAGNOSIS — C659 Malignant neoplasm of unspecified renal pelvis: Secondary | ICD-10-CM

## 2022-10-07 DIAGNOSIS — Z85118 Personal history of other malignant neoplasm of bronchus and lung: Secondary | ICD-10-CM | POA: Diagnosis not present

## 2022-10-07 DIAGNOSIS — Z95828 Presence of other vascular implants and grafts: Secondary | ICD-10-CM

## 2022-10-07 LAB — CBC WITH DIFFERENTIAL (CANCER CENTER ONLY)
Abs Immature Granulocytes: 0.03 10*3/uL (ref 0.00–0.07)
Basophils Absolute: 0 10*3/uL (ref 0.0–0.1)
Basophils Relative: 0 %
Eosinophils Absolute: 0.4 10*3/uL (ref 0.0–0.5)
Eosinophils Relative: 4 %
HCT: 37 % (ref 36.0–46.0)
Hemoglobin: 12.4 g/dL (ref 12.0–15.0)
Immature Granulocytes: 0 %
Lymphocytes Relative: 19 %
Lymphs Abs: 1.8 10*3/uL (ref 0.7–4.0)
MCH: 30.3 pg (ref 26.0–34.0)
MCHC: 33.5 g/dL (ref 30.0–36.0)
MCV: 90.5 fL (ref 80.0–100.0)
Monocytes Absolute: 0.8 10*3/uL (ref 0.1–1.0)
Monocytes Relative: 8 %
Neutro Abs: 6.7 10*3/uL (ref 1.7–7.7)
Neutrophils Relative %: 69 %
Platelet Count: 255 10*3/uL (ref 150–400)
RBC: 4.09 MIL/uL (ref 3.87–5.11)
RDW: 14.4 % (ref 11.5–15.5)
WBC Count: 9.7 10*3/uL (ref 4.0–10.5)
nRBC: 0 % (ref 0.0–0.2)

## 2022-10-07 LAB — CMP (CANCER CENTER ONLY)
ALT: 12 U/L (ref 0–44)
AST: 10 U/L — ABNORMAL LOW (ref 15–41)
Albumin: 3.8 g/dL (ref 3.5–5.0)
Alkaline Phosphatase: 71 U/L (ref 38–126)
Anion gap: 7 (ref 5–15)
BUN: 30 mg/dL — ABNORMAL HIGH (ref 8–23)
CO2: 28 mmol/L (ref 22–32)
Calcium: 9 mg/dL (ref 8.9–10.3)
Chloride: 104 mmol/L (ref 98–111)
Creatinine: 1.27 mg/dL — ABNORMAL HIGH (ref 0.44–1.00)
GFR, Estimated: 44 mL/min — ABNORMAL LOW (ref >60)
Glucose, Bld: 122 mg/dL — ABNORMAL HIGH (ref 70–99)
Potassium: 3.5 mmol/L (ref 3.5–5.1)
Sodium: 139 mmol/L (ref 135–145)
Total Bilirubin: 0.8 mg/dL (ref 0.3–1.2)
Total Protein: 6.6 g/dL (ref 6.5–8.1)

## 2022-10-07 LAB — POCT GLYCOSYLATED HEMOGLOBIN (HGB A1C): Hemoglobin A1C: 6.7 % — AB (ref 4.0–5.6)

## 2022-10-07 MED ORDER — HEPARIN SOD (PORK) LOCK FLUSH 100 UNIT/ML IV SOLN
500.0000 [IU] | Freq: Once | INTRAVENOUS | Status: AC
  Administered 2022-10-07: 500 [IU]

## 2022-10-07 MED ORDER — SODIUM CHLORIDE 0.9% FLUSH
10.0000 mL | Freq: Once | INTRAVENOUS | Status: AC
  Administered 2022-10-07: 10 mL

## 2022-10-07 NOTE — Progress Notes (Signed)
Established Patient Office Visit     CC/Reason for Visit: Diabetes follow-up  HPI: Maria Marquez is a 77 y.o. female who is coming in today for the above mentioned reasons.  She is scheduled today for 17-month A1c check and.  She has been feeling well.  She has had a cystoscopy and stent placement recently.   Past Medical/Surgical History: Past Medical History:  Diagnosis Date   Anemia associated with chemotherapy    followed by dr Alen Blew   Carcinoma of renal pelvis, right [facility])    urologist-- dr winter/  oncologist-- dr Alen Blew;   dx 08/ 2021 ;  chemo 10/ 2021  to 12/ 2021 and complete laser tumor ablation;  recurrent 05/ 2023   completed 6 mitomyocin gel infusions via nephrostomy tube 08/ 2023   Chronic kidney disease, stage 3a ([place]) 12/31/2019   Diabetes mellitus type 2, diet-controlled ([place])    followed by pcp;    (05-25-2022  per pt check blood sugar multiple times daily w/ Libre,  fasting average 60-100)   Dyspnea on exertion    09-27-2021  pt stated sob w/ long distancewalk and stairs but recovers quickly when stop/ rest,  ok with household chores and short walks   Family history of breast cancer 05/04/2021   Family history of ovarian cancer 05/04/2021   Generalized weakness    GERD (gastroesophageal reflux disease)    History of basal cell carcinoma ([place]) excision    per pt in 1970s on nose   History of chemotherapy    06-13-2020  to 12/ 2021  for right renal pelvis carcinoma   History of colon polyps    History of radiation therapy    11-13-2020 to 11-26-2020---   Right Lung- SBRT- Dr. Gery Pray   HLD (hyperlipidemia)    Hypertension    followed by pcp   Hypothyroidism, postsurgical 1979   followed by pcp   Leukocytosis    Nocturia    OA (osteoarthritis)    PONV (postoperative nausea and vomiting)    Since chemo, ponv   Port-A-Cath in place    Primary adenocarcinoma of upper lobe of right lung [facility])    oncologist--- shadad/  pulmonology-- dr byrum;  dx  02/ 2022;  s/p  SBRT 11-13-2020 to 11-26-2020   Urgency of urination    wears pads   Varicose veins of both legs with edema    Wears partial dentures    lower    Past Surgical History:  Procedure Laterality Date   COLONOSCOPY  last one 2017   CYSTOSCOPY W/ URETERAL STENT PLACEMENT Right 01/27/2022   Procedure: CYSTOSCOPY WITH RETROGRADE PYELOGRAM/ URETEROSCOPY/POSSIBLE BIOPSY/ POSSIBLE  URETERAL STENT PLACEMENT;  Surgeon: Ceasar Mons, MD;  Location: [facility];  Service: Urology;  Laterality: Right;   CYSTOSCOPY WITH BIOPSY Right 09/16/2021   Procedure: CYSTOSCOPY / URETEROSCOPY WITH BIOPSY/bugbee fulgeration/right retrograde;  Surgeon: Ceasar Mons, MD;  Location: [facility];  Service: Urology;  Laterality: Right;   CYSTOSCOPY WITH STENT PLACEMENT Right 10/01/2022   Procedure: CYSTOSCOPY WITH STENT PLACEMENT;  Surgeon: Ceasar Mons, MD;  Location: [facility];  Service: Urology;  Laterality: Right;   CYSTOSCOPY WITH URETEROSCOPY Right 04/18/2020   Procedure: CYSTOSCOPY WITH URETEROSCOPY, BIOPSY;  Surgeon: Ceasar Mons, MD;  Location: [facility];  Service: Urology;  Laterality: Right;  ONLY NEEDS 45 MIN   CYSTOSCOPY WITH URETEROSCOPY Right 06/02/2022   Procedure: CYSTOSCOPY WITH URETEROSCOPY;  Surgeon: Ceasar Mons,  MD;  Location: [place];  Service: Urology;  Laterality: Right;  ONLY NEEDS 60 MINS   CYSTOSCOPY WITH URETEROSCOPY AND STENT PLACEMENT N/A 12/31/2020   Procedure: CYSTOSCOPY WITH RIGHT URETEROSCOPY/ BILATERAL RETROGRADE PYELOGRAM/RIGHT URETERAL BIOPSY/ LASER ABLATION/RIGHT STENT PLACEMENT;  Surgeon: Ceasar Mons, MD;  Location: [facility];  Service: Urology;  Laterality: N/A;   CYSTOSCOPY/RETROGRADE/URETEROSCOPY Right 04/22/2021   Procedure: CYSTOSCOPY/RETROGRADE/URETEROSCOPY/ STENT PLACEMENT;  Surgeon: Ceasar Mons, MD;   Location: [facility];  Service: Urology;  Laterality: Right;   EYE SURGERY Bilateral 1987   tear ducts   IR IMAGING GUIDED PORT INSERTION  06/04/2020   IR NEPHROSTOMY PLACEMENT RIGHT  03/03/2022   THYROID LOBECTOMY Right 1979   TOTAL HIP ARTHROPLASTY Right 01/28/2016   Procedure: RIGHT TOTAL HIP ARTHROPLASTY ANTERIOR APPROACH;  Surgeon: [name], MD;  Location: [facility];  Service: Orthopedics;  Laterality: Right;   UMBILICAL HERNIA REPAIR  1980s   URETEROSCOPY Right 10/01/2022   Procedure: URETEROSCOPY WITH BIOPSY pylegram;  Surgeon: Ceasar Mons, MD;  Location: [facility];  Service: Urology;  Laterality: Right;  [place] N/A 06/06/2020   Procedure: VIDEO BRONCHOSCOPY WITH ENDOBRONCHIAL NAVIGATION;  Surgeon: Collene Gobble, MD;  Location: [place];  Service: Thoracic;  Laterality: N/A;   VIDEO BRONCHOSCOPY WITH ENDOBRONCHIAL NAVIGATION N/A 10/10/2020   Procedure: VIDEO BRONCHOSCOPY WITH ENDOBRONCHIAL NAVIGATION;  Surgeon: [name], MD;  Location: [place];  Service: Thoracic;  Laterality: N/A;    Social History:  reports that she quit smoking about 2 years ago. Her smoking use included cigarettes. She has a 45.00 pack-year smoking history. She has never used smokeless tobacco. She reports that she does not currently use alcohol. She reports that she does not use drugs.  Allergies: Allergies  Allergen Reactions   Iodine Shortness Of Breath   Keflex [Cephalexin] Shortness Of Breath, Rash and Tinitus   Shellfish Allergy Hives   Shellfish-Derived Products Anaphylaxis and Hives   Hydrocodone Hives and Itching    Itching throat   Lasix [Furosemide] Itching   Rosuvastatin Other (See Comments)    Muscle Pain in Thighs   Latex Rash   Lisinopril Cough   Sulfa Antibiotics Nausea Only   Tramadol Itching    Family History:  Family History  Problem Relation Age of Onset   Arthritis Mother     Breast cancer Mother 70   Schizophrenia Mother    Diabetes Father    Heart disease Father    Kidney disease Father    Stroke Sister    Lung cancer Brother        dx > 31; work-related exposures   Heart disease Brother    Heart disease Brother    Vision loss Brother        Glaucoma   Alcohol abuse Brother    Lung cancer Maternal Aunt        dx > 31   Colon cancer Paternal Aunt        dx > 88   Head & neck cancer Paternal Uncle        dx 3s   Ovarian cancer Daughter 27   Breast cancer Niece 36   Leukemia Cousin        d. 82s     Current Outpatient Medications:    albuterol (VENTOLIN HFA) 108 (90 Base) MCG/ACT inhaler, INHALE ONE OR TWO PUFFS BY MOUTH INTO THE LUNGS EVERY SIX HOURS AS  NEEDED FOR WHEEZING OR SHORTNESS OF BREATH, Disp: 18 g, Rfl: 2   atorvastatin (LIPITOR) 20 MG tablet, TAKE 1 TABLET EVERY DAY (Patient taking differently: Take 20 mg by mouth at bedtime.), Disp: 90 tablet, Rfl: 1   Continuous Blood Gluc Receiver (FREESTYLE LIBRE 3 READER) DEVI, 1 each by Does not apply route daily., Disp: 1 each, Rfl: 3   Continuous Blood Gluc Sensor (FREESTYLE LIBRE 3 SENSOR) MISC, PLACE 1 SENSOR ON THE SKIN EVERY 14 DAYS, CHECK GLUCOSE CONTINUOUSLY, Disp: 2 each, Rfl: 6   EPINEPHRINE 0.3 mg/0.3 mL IJ SOAJ injection, Inject 0.3 mg into the muscle as needed for anaphylaxis, Disp: 2 each, Rfl: 0   estradiol-norethindrone (ACTIVELLA) 1-0.5 MG tablet, TAKE ONE TABLET BY MOUTH ONE TIME DAILY (Patient taking differently: Take 1 tablet by mouth every other day.), Disp: 28 tablet, Rfl: 0   ezetimibe (ZETIA) 10 MG tablet, TAKE 1 TABLET EVERY DAY, Disp: 90 tablet, Rfl: 1   furosemide (LASIX) 20 MG tablet, TAKE 1 TABLET BY MOUTH EVERY DAY AS NEEDED FOR LEG SWELLING, Disp: 90 tablet, Rfl: 1   levothyroxine (SYNTHROID) 150 MCG tablet, TAKE 1 TABLET EVERY DAY, Disp: 90 tablet, Rfl: 1   lidocaine-prilocaine (EMLA) cream, Apply 1 application. topically as needed., Disp: 30 g, Rfl: 0    losartan-hydrochlorothiazide (HYZAAR) 50-12.5 MG tablet, TAKE 1 TABLET EVERY DAY (Patient taking differently: Take 1 tablet by mouth daily.), Disp: 90 tablet, Rfl: 1   pantoprazole (PROTONIX) 40 MG tablet, TAKE 1 TABLET BY MOUTH EVERY DAY, Disp: 90 tablet, Rfl: 1   Vitamin D, Ergocalciferol, (DRISDOL) 1.25 MG (50000 UNIT) CAPS capsule, Take 1 capsule (50,000 Units total) by mouth every 7 (seven) days for 12 doses., Disp: 12 capsule, Rfl: 0   XIIDRA 5 % SOLN, Place 1 drop into both eyes 2 (two) times daily., Disp: , Rfl:   Review of Systems:  Negative unless indicated in HPI.   Physical Exam: Vitals:   10/07/22 1314  BP: (!) 135/51  Pulse: 78  Temp: 98.1 F (36.7 C)  TempSrc: Oral  SpO2: 98%  Weight: 274 lb 3.2 oz (124.4 kg)    Body mass index is 44.94 kg/m.   Physical Exam Vitals reviewed.  Constitutional:      Appearance: Normal appearance.  HENT:     Head: Normocephalic and atraumatic.  Eyes:     Conjunctiva/sclera: Conjunctivae normal.     Pupils: Pupils are equal, round, and reactive to light.  Cardiovascular:     Rate and Rhythm: Normal rate and regular rhythm.  Pulmonary:     Effort: Pulmonary effort is normal.     Breath sounds: Normal breath sounds.  Skin:    General: Skin is warm and dry.  Neurological:     General: No focal deficit present.     Mental Status: She is alert and oriented to person, place, and time.  Psychiatric:        Mood and Affect: Mood normal.        Behavior: Behavior normal.        Thought Content: Thought content normal.        Judgment: Judgment normal.      Impression and Plan:  Type 2 diabetes mellitus with other specified complication, without long-term current use of insulin (HCC) - Plan: POC HgB A1c, Urine microalbumin-creatinine with uACR  Essential hypertension  -Blood pressure is well-controlled. -A1c of 6.7 demonstrates good diabetic control.  Time spent:30 minutes reviewing chart, interviewing and examining  patient and formulating  plan of care.     [name], MD [facility]

## 2022-10-14 DIAGNOSIS — N1832 Chronic kidney disease, stage 3b: Secondary | ICD-10-CM | POA: Diagnosis not present

## 2022-10-15 DIAGNOSIS — C651 Malignant neoplasm of right renal pelvis: Secondary | ICD-10-CM | POA: Diagnosis not present

## 2022-10-15 DIAGNOSIS — N390 Urinary tract infection, site not specified: Secondary | ICD-10-CM | POA: Diagnosis not present

## 2022-10-17 ENCOUNTER — Encounter: Payer: Self-pay | Admitting: Hematology and Oncology

## 2022-10-17 NOTE — Progress Notes (Signed)
[place] [phone]   [phone]  PROGRESS NOTE  Patient Care Team: [name], Rayford Halsted, MD as PCP - General (Internal Medicine) [name], MD as Consulting Physician (Gastroenterology) [name] as Consulting Physician (Surgery) [name], MD as Consulting Physician (Orthopedic Surgery) [name], MD as Referring Physician (Allergy) [name], MD as Consulting Physician (Ophthalmology) [name], PsyD as Counselor (Psychology) Bjorn Loser, MD as Consulting Physician (Urology) [name], [facility] (Inactive) as Pharmacist (Pharmacist)  Hematological/Oncological History #  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  She completed 3 cycles and cycle 4 was given without cisplatin completed in December 2021.  10/10/2020: adenocarcinoma of the lung diagnosed  11/13/2020-11/26/2020: SBRT to right lung cancer, received total of 50 Gray and 10 fractions.  04/20/2022: last visit with Dr. Alen Blew  10/07/2022: transition care to Dr. Lorenso Courier   Interval History:  Maria Marquez 77 y.o. 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 The patient's last visit was on 04/20/2022 with Dr. Alen Blew. In the interim since the last visit she has completed her treatment of mitomycin gel infusion under the care of Dr. Lovena Neighbours  On exam today Maria Marquez reports she underwent a cystoscopy on 10/01/2022 and was found to have no evidence of residual cancer.  She notes that she unfortunately has a stent in place which is quite uncomfortable.  She reports that there was some initial blood loss but now she is not having any further  blood in the urine.  It is uncomfortable however.  She reports that her energy is currently about a 6 out of 10 and is not as good as she would like it to be.  She reports her appetite however is good but is not quite as good as before she started chemotherapy.  She is not currently having any pain.  Her weight is stable overall.  She reports that she was previously taking medication for bladder spasms and occasional Tylenol.  She notes that she is not having any trouble with fevers, chills, sweats, nausea, vomiting or diarrhea.  A full 10 point ROS was otherwise negative.  MEDICAL HISTORY:  Past Medical History:  Diagnosis Date   Anemia associated with chemotherapy    followed by dr Alen Blew   Carcinoma of renal pelvis, right [facility])    urologist-- dr winter/  oncologist-- dr Alen Blew;   dx 08/ 2021 ;  chemo 10/ 2021  to 12/ 2021 and complete laser tumor ablation;  recurrent 05/ 2023   completed 6 mitomyocin gel infusions via nephrostomy tube 08/ 2023   Chronic kidney disease, stage 3a ([place]) 12/31/2019   Diabetes mellitus type 2, diet-controlled ([place])    followed by pcp;    (05-25-2022  per pt check blood sugar multiple times daily w/ Libre,  fasting average 60-100)   Dyspnea on exertion    09-27-2021  pt stated sob w/ long distancewalk and stairs but recovers quickly when stop/ rest,  ok with household chores and short walks   Family history of breast cancer 05/04/2021   Family history of ovarian cancer 05/04/2021   Generalized weakness    GERD (gastroesophageal reflux disease)    History of basal cell carcinoma (BCC) excision    per pt  in 1970s on nose   History of chemotherapy    06-13-2020  to 12/ 2021  for right renal pelvis carcinoma   History of colon polyps    History of radiation therapy    11-13-2020 to 11-26-2020---   Right Lung- SBRT- Dr. Gery Pray   HLD (hyperlipidemia)    Hypertension    followed by pcp   Hypothyroidism, postsurgical 1979   followed by pcp   Leukocytosis     Nocturia    OA (osteoarthritis)    PONV (postoperative nausea and vomiting)    Since chemo, ponv   Port-A-Cath in place    Primary adenocarcinoma of upper lobe of right lung [facility])    oncologist--- shadad/  pulmonology-- dr byrum;  dx 02/ 2022;  s/p  SBRT 11-13-2020 to 11-26-2020   Urgency of urination    wears pads   Varicose veins of both legs with edema    Wears partial dentures    lower    SURGICAL HISTORY: Past Surgical History:  Procedure Laterality Date   COLONOSCOPY  last one 2017   CYSTOSCOPY W/ URETERAL STENT PLACEMENT Right 01/27/2022   Procedure: CYSTOSCOPY WITH RETROGRADE PYELOGRAM/ URETEROSCOPY/POSSIBLE BIOPSY/ POSSIBLE  URETERAL STENT PLACEMENT;  Surgeon: Ceasar Mons, MD;  Location: [facility];  Service: Urology;  Laterality: Right;   CYSTOSCOPY WITH BIOPSY Right 09/16/2021   Procedure: CYSTOSCOPY / URETEROSCOPY WITH BIOPSY/bugbee fulgeration/right retrograde;  Surgeon: Ceasar Mons, MD;  Location: [facility];  Service: Urology;  Laterality: Right;   CYSTOSCOPY WITH STENT PLACEMENT Right 10/01/2022   Procedure: CYSTOSCOPY WITH STENT PLACEMENT;  Surgeon: Ceasar Mons, MD;  Location: [facility];  Service: Urology;  Laterality: Right;   CYSTOSCOPY WITH URETEROSCOPY Right 04/18/2020   Procedure: CYSTOSCOPY WITH URETEROSCOPY, BIOPSY;  Surgeon: Ceasar Mons, MD;  Location: [facility];  Service: Urology;  Laterality: Right;  ONLY NEEDS 45 MIN   CYSTOSCOPY WITH URETEROSCOPY Right 06/02/2022   Procedure: CYSTOSCOPY WITH URETEROSCOPY;  Surgeon: Ceasar Mons, MD;  Location: [facility]  Service: Urology;  Laterality: Right;  ONLY NEEDS 60 MINS   CYSTOSCOPY WITH URETEROSCOPY AND STENT PLACEMENT N/A 12/31/2020   Procedure: CYSTOSCOPY WITH RIGHT URETEROSCOPY/ BILATERAL RETROGRADE PYELOGRAM/RIGHT URETERAL BIOPSY/ LASER ABLATION/RIGHT STENT PLACEMENT;  Surgeon: Ceasar Mons, MD;  Location: [facility];  Service: Urology;  Laterality: N/A;   CYSTOSCOPY/RETROGRADE/URETEROSCOPY Right 04/22/2021   Procedure: CYSTOSCOPY/RETROGRADE/URETEROSCOPY/ STENT PLACEMENT;  Surgeon: Ceasar Mons, MD;  Location: [facility];  Service: Urology;  Laterality: Right;   EYE SURGERY Bilateral 1987   tear ducts   IR IMAGING GUIDED PORT INSERTION  06/04/2020   IR NEPHROSTOMY PLACEMENT RIGHT  03/03/2022   THYROID LOBECTOMY Right 1979   TOTAL HIP ARTHROPLASTY Right 01/28/2016   Procedure: RIGHT TOTAL HIP ARTHROPLASTY ANTERIOR APPROACH;  Surgeon: [name], MD;  Location: [facility];  Service: Orthopedics;  Laterality: Right;   UMBILICAL HERNIA REPAIR  1980s   URETEROSCOPY Right 10/01/2022   Procedure: URETEROSCOPY WITH BIOPSY pylegram;  Surgeon: Ceasar Mons, MD;  Location: [facility];  Service: Urology;  Laterality: Right;  [place] N/A 06/06/2020   Procedure: VIDEO BRONCHOSCOPY WITH ENDOBRONCHIAL NAVIGATION;  Surgeon: Collene Gobble, MD;  Location: [place];  Service: Thoracic;  Laterality: N/A;   VIDEO BRONCHOSCOPY WITH ENDOBRONCHIAL NAVIGATION N/A 10/10/2020   Procedure: VIDEO BRONCHOSCOPY WITH ENDOBRONCHIAL NAVIGATION;  Surgeon: [name], MD;  Location: [facility];  Service: Thoracic;  Laterality: N/A;    SOCIAL HISTORY: Social History   Socioeconomic History   Marital status: Widowed    Spouse name: Not on file   Number of children: Not on file   Years of education: Not on file   Highest education level: Associate degree: occupational, Hotel manager, or vocational program  Occupational History   Occupation: Retired  Tobacco Use   Smoking status: Former    Packs/day: 1.00    Years: 45.00    Total pack years: 45.00    Types: Cigarettes    Quit date: 04/18/2020    Years since quitting: 2.4   Smokeless tobacco: Never  Vaping Use   Vaping  Use: Never used  Substance and Sexual Activity   Alcohol use: Not Currently    Comment: rare   Drug use: No   Sexual activity: Not on file  Other Topics Concern   Not on file  Social History Narrative   Not on file   Social Determinants of Health   Financial Resource Strain: [place]  (11/16/2021)   Overall Financial Resource Strain (CARDIA)    Difficulty of Paying Living Expenses: Not very hard  Food Insecurity: No Food Insecurity (09/24/2021)   Hunger Vital Sign    Worried About Running Out of Food in the Last Year: Never true    [place] in the Last Year: Never true  Transportation Needs: No Transportation Needs (11/16/2021)   PRAPARE - Hydrologist (Medical): No    Lack of Transportation (Non-Medical): No  Physical Activity: Unknown (09/24/2021)   Exercise Vital Sign    Days of Exercise per Week: 0 days    Minutes of Exercise per Session: Not on file  Stress: No Stress Concern Present (09/24/2021)   [place]    Feeling of Stress : Not at all  Social Connections: Socially Isolated (09/24/2021)   Social Connection and Isolation Panel [NHANES]    Frequency of Communication with Friends and Family: Twice a week    Frequency of Social Gatherings with Friends and Family: Once a week    Attends Religious Services: Never    Marine scientist or Organizations: No    Attends Music therapist: Not on file    Marital Status: Widowed  Human resources officer Violence: Not on file    FAMILY HISTORY: Family History  Problem Relation Age of Onset   Arthritis Mother    Breast cancer Mother 18   Schizophrenia Mother    Diabetes Father    Heart disease Father    Kidney disease Father    Stroke Sister    Lung cancer Brother        dx > 88; work-related exposures   Heart disease Brother    Heart disease Brother    Vision loss Brother        Glaucoma   Alcohol abuse Brother     Lung cancer Maternal Aunt        dx > 20   Colon cancer Paternal Aunt        dx > 79   Head & neck cancer Paternal Uncle        dx 44s   Ovarian cancer Daughter 11   Breast cancer Niece 57   Leukemia Cousin        d. 64s    ALLERGIES:  is allergic to iodine, keflex [cephalexin], shellfish  allergy, shellfish-derived products, hydrocodone, lasix [furosemide], rosuvastatin, latex, lisinopril, sulfa antibiotics, and tramadol.  MEDICATIONS:  Current Outpatient Medications  Medication Sig Dispense Refill   albuterol (VENTOLIN HFA) 108 (90 Base) MCG/ACT inhaler INHALE ONE OR TWO PUFFS BY MOUTH INTO THE LUNGS EVERY SIX HOURS AS NEEDED FOR WHEEZING OR SHORTNESS OF BREATH 18 g 2   atorvastatin (LIPITOR) 20 MG tablet TAKE 1 TABLET EVERY DAY (Patient taking differently: Take 20 mg by mouth at bedtime.) 90 tablet 1   Continuous Blood Gluc Receiver (FREESTYLE LIBRE 3 READER) DEVI 1 each by Does not apply route daily. 1 each 3   Continuous Blood Gluc Sensor (FREESTYLE LIBRE 3 SENSOR) MISC PLACE 1 SENSOR ON THE SKIN EVERY 14 DAYS, CHECK GLUCOSE CONTINUOUSLY 2 each 6   EPINEPHRINE 0.3 mg/0.3 mL IJ SOAJ injection Inject 0.3 mg into the muscle as needed for anaphylaxis 2 each 0   estradiol-norethindrone (ACTIVELLA) 1-0.5 MG tablet TAKE ONE TABLET BY MOUTH ONE TIME DAILY (Patient taking differently: Take 1 tablet by mouth every other day.) 28 tablet 0   ezetimibe (ZETIA) 10 MG tablet TAKE 1 TABLET EVERY DAY 90 tablet 1   furosemide (LASIX) 20 MG tablet TAKE 1 TABLET BY MOUTH EVERY DAY AS NEEDED FOR LEG SWELLING 90 tablet 1   levothyroxine (SYNTHROID) 150 MCG tablet TAKE 1 TABLET EVERY DAY 90 tablet 1   lidocaine-prilocaine (EMLA) cream Apply 1 application. topically as needed. 30 g 0   losartan-hydrochlorothiazide (HYZAAR) 50-12.5 MG tablet TAKE 1 TABLET EVERY DAY (Patient taking differently: Take 1 tablet by mouth daily.) 90 tablet 1   pantoprazole (PROTONIX) 40 MG tablet TAKE 1 TABLET BY MOUTH EVERY DAY  90 tablet 1   Vitamin D, Ergocalciferol, (DRISDOL) 1.25 MG (50000 UNIT) CAPS capsule Take 1 capsule (50,000 Units total) by mouth every 7 (seven) days for 12 doses. 12 capsule 0   XIIDRA 5 % SOLN Place 1 drop into both eyes 2 (two) times daily.     No current facility-administered medications for this visit.    REVIEW OF SYSTEMS:   Constitutional: ( - ) fevers, ( - )  chills , ( - ) night sweats Eyes: ( - ) blurriness of vision, ( - ) double vision, ( - ) watery eyes Ears, nose, mouth, throat, and face: ( - ) mucositis, ( - ) sore throat Respiratory: ( - ) cough, ( - ) dyspnea, ( - ) wheezes Cardiovascular: ( - ) palpitation, ( - ) chest discomfort, ( - ) lower extremity swelling Gastrointestinal:  ( - ) nausea, ( - ) heartburn, ( - ) change in bowel habits Skin: ( - ) abnormal skin rashes Lymphatics: ( - ) new lymphadenopathy, ( - ) easy bruising Neurological: ( - ) numbness, ( - ) tingling, ( - ) new weaknesses Behavioral/Psych: ( - ) mood change, ( - ) new changes  All other systems were reviewed with the patient and are negative.  PHYSICAL EXAMINATION:  Vitals:   10/07/22 1204  BP: (!) 152/62  Pulse: 79  Resp: 14  Temp: 98.1 F (36.7 C)  SpO2: 100%   Filed Weights   10/07/22 1204  Weight: 272 lb 6.4 oz (123.6 kg)    GENERAL: Well-appearing elderly Caucasian female, alert, no distress and comfortable SKIN: skin color, texture, turgor are normal, no rashes or significant lesions EYES: conjunctiva are pink and non-injected, sclera clear LUNGS: clear to auscultation and percussion with normal breathing effort HEART: regular rate & rhythm and no murmurs and  no lower extremity edema Musculoskeletal: no cyanosis of digits and no clubbing  PSYCH: alert & oriented x 3, fluent speech NEURO: no focal motor/sensory deficits  LABORATORY DATA:  I have reviewed the data as listed    Latest Ref Rng & Units 10/07/2022   11:58 AM 10/01/2022   11:25 AM 09/20/2022   11:57 AM  CBC   WBC 4.0 - 10.5 K/uL 9.7   9.2   Hemoglobin 12.0 - 15.0 g/dL 12.4  12.9  13.1   Hematocrit 36.0 - 46.0 % 37.0  38.0  39.5   Platelets 150 - 400 K/uL 255   271.0        Latest Ref Rng & Units 10/07/2022   11:58 AM 10/01/2022   11:25 AM 09/20/2022   11:57 AM  CMP  Glucose 70 - 99 mg/dL 122  143  121   BUN 8 - 23 mg/dL '30  31  25   '$ Creatinine 0.44 - 1.00 mg/dL 1.27  1.30  1.31   Sodium 135 - 145 mmol/L 139  142  137   Potassium 3.5 - 5.1 mmol/L 3.5  3.5  4.1   Chloride 98 - 111 mmol/L 104  104  100   CO2 22 - 32 mmol/L 28   28   Calcium 8.9 - 10.3 mg/dL 9.0   9.5   Total Protein 6.5 - 8.1 g/dL 6.6   7.1   Total Bilirubin 0.3 - 1.2 mg/dL 0.8   0.9   Alkaline Phos 38 - 126 U/L 71   79   AST 15 - 41 U/L 10   11   ALT 0 - 44 U/L 12   13     RADIOGRAPHIC STUDIES: No results found.  ASSESSMENT & PLAN Maria Marquez 77 y.o. 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 Lovena Neighbours  --Salvage therapy options including Pembrolizumab will be deferred if she developed relapsed disease  --continue to be on active surveillance and repeat cystoscopy  --Labs today show white blood cell count 9.7, hemoglobin 12.4, MCV 90.5, and platelets of 255 --RTC in 6 months or sooner if signs/symptoms of recurrent disease.   # T1aN0 adenocarcinoma of the lung  -- recommend CT chest q 6 months x 5 years followed by annually thereafter.  -- Continue every 6 month follow up.  Orders Placed This Encounter  Procedures   CT Chest W Contrast    Standing Status:   Future    Standing Expiration Date:   10/17/2023    Order Specific Question:   If indicated for the ordered procedure, I authorize the  administration of contrast media per Radiology protocol    Answer:   Yes    Order Specific Question:   Does the patient have a contrast media/X-ray dye allergy?    Answer:   No    Order Specific Question:   Preferred imaging location?    Answer:   [facility]    All questions were answered. The patient knows to call the clinic with any problems, questions or concerns.  A total of more than 40 minutes were spent on this encounter with face-to-face time and non-face-to-face time, including preparing to see the patient, ordering tests and/or  medications, counseling the patient and coordination of care as outlined above.   Ledell Peoples, MD Department of Hematology/Oncology [place] [facility] Phone: [phone] Pager: [phone] Email: [email or web address]  10/17/2022 6:09 PM

## 2022-10-18 ENCOUNTER — Telehealth: Payer: Self-pay | Admitting: Internal Medicine

## 2022-10-18 DIAGNOSIS — E1122 Type 2 diabetes mellitus with diabetic chronic kidney disease: Secondary | ICD-10-CM | POA: Diagnosis not present

## 2022-10-18 DIAGNOSIS — I129 Hypertensive chronic kidney disease with stage 1 through stage 4 chronic kidney disease, or unspecified chronic kidney disease: Secondary | ICD-10-CM | POA: Diagnosis not present

## 2022-10-18 DIAGNOSIS — N1832 Chronic kidney disease, stage 3b: Secondary | ICD-10-CM | POA: Diagnosis not present

## 2022-10-18 DIAGNOSIS — E785 Hyperlipidemia, unspecified: Secondary | ICD-10-CM | POA: Diagnosis not present

## 2022-10-18 DIAGNOSIS — K7460 Unspecified cirrhosis of liver: Secondary | ICD-10-CM | POA: Diagnosis not present

## 2022-10-18 DIAGNOSIS — D631 Anemia in chronic kidney disease: Secondary | ICD-10-CM | POA: Diagnosis not present

## 2022-10-18 NOTE — Telephone Encounter (Signed)
Continuous Blood Gluc Sensor (FREESTYLE LIBRE 3 SENSOR) MISC says pharmacy needs a PA from insurance

## 2022-10-20 NOTE — Telephone Encounter (Signed)
Left message on machine for patient to return our call 

## 2022-10-21 ENCOUNTER — Other Ambulatory Visit: Payer: Self-pay

## 2022-10-21 ENCOUNTER — Telehealth: Payer: Self-pay

## 2022-10-21 MED ORDER — PREDNISONE 50 MG PO TABS: ORAL_TABLET | ORAL | 0 refills | Status: DC

## 2022-10-21 NOTE — Progress Notes (Signed)
Care Management & Coordination Services Pharmacy Note  10/22/2022 Name:  Maria Marquez MRN:  [id number] DOB:  1946-02-03  Summary: BP at goal <130/80 per home readings daily LDL >70 (74 on 03/17/22), pt has since made diet changes Nephrologist has stopped hctz, pt starting farxiga '10mg'$ , losartan '50mg'$  on 10/23/22  Recommendations/Changes made from today's visit: -Continue checking BP daily. Notify PCP/Nephrology of any concerns with losartan/farxiga. -Continue current diet choices, recommend updated lipid panel 02/2023 to assess control.  Follow up plan: BP/DM review call in 3 months Pharmacist call in 6 months   Subjective: Maria Marquez is an 77 y.o. year old female who is a primary patient of [name], Rayford Halsted, MD.  The care coordination team was consulted for assistance with disease management and care coordination needs.    Engaged with patient by telephone for follow up visit.  Recent office visits: 10/07/22 [name] - For DM Mgmt, no med changes  10/04/22 Phone message with PCP - Stop famotidine  09/20/22 and 09/23/22 [name] - For LUQ Pain, no med changes.  08/31/22 [place] laryngitis, no medication changes.  08/18/22 Alysia Penna, MD - For bronchitis. START Augmentin and hydrocodone-homatropine cough syrup  07/06/2022 [name] MD - Patient was seen for Type 2 diabetes mellitus with other specified complication, without long-term current use of insulin and additional concerns. No medication changes. Follow up in 3 months.    Recent consult visits:  10/07/22 Ledell Peoples, MD - Oncology, no medication changes 07/20/22 Criselda Peaches, DPM - For foot care, no medication changes.  Hospital visits: 10/01/22 [facility], scheduled admit for ureteroscopy. START Macrobid x 3 days   Objective:  Lab Results  Component Value Date   CREATININE 1.27 (H) 10/07/2022   BUN 30 (H) 10/07/2022   GFR  40.10 (L) 09/20/2022   GFRNONAA 44 (L) 10/07/2022   GFRAA 51 (L) 04/15/2020   NA 139 10/07/2022   K 3.5 10/07/2022   CALCIUM 9.0 10/07/2022   CO2 28 10/07/2022   GLUCOSE 122 (H) 10/07/2022    Lab Results  Component Value Date/Time   HGBA1C 6.7 (A) 10/07/2022 01:17 PM   HGBA1C 6.3 (A) 07/06/2022 11:42 AM   HGBA1C 7.5 (H) 03/29/2022 08:07 AM   HGBA1C 6.2 03/19/2021 03:14 PM   HGBA1C 6.1 09/23/2015 12:00 AM   GFR 40.10 (L) 09/20/2022 11:57 AM   GFR 32.38 (L) 03/29/2022 08:07 AM    Last diabetic Eye exam:  Lab Results  Component Value Date/Time   HMDIABEYEEXA No Retinopathy 01/06/2022 12:00 AM    Last diabetic Foot exam: No results found for: "HMDIABFOOTEX"   Lab Results  Component Value Date   CHOL 156 03/17/2022   HDL 47.40 03/17/2022   LDLCALC 74 03/17/2022   LDLDIRECT 165.0 03/19/2021   TRIG 173.0 (H) 03/17/2022   CHOLHDL 3 03/17/2022       Latest Ref Rng & Units 10/07/2022   11:58 AM 09/20/2022   11:57 AM 07/21/2022    1:05 PM  Hepatic Function  Total Protein 6.5 - 8.1 g/dL 6.6  7.1  7.1   Albumin 3.5 - 5.0 g/dL 3.8  4.1  4.2   AST 15 - 41 U/L '10  11  12   '$ ALT 0 - 44 U/L '12  13  13   '$ Alk Phosphatase 38 - 126 U/L 71  79  76   Total Bilirubin 0.3 - 1.2 mg/dL 0.8  0.9  0.7  Lab Results  Component Value Date/Time   TSH 2.94 03/29/2022 08:07 AM   TSH 2.60 11/19/2021 02:21 PM   FREET4 1.45 11/01/2018 01:06 PM   FREET4 1.83 (H) 06/13/2018 08:43 AM       Latest Ref Rng & Units 10/07/2022   11:58 AM 10/01/2022   11:25 AM 09/20/2022   11:57 AM  CBC  WBC 4.0 - 10.5 K/uL 9.7   9.2   Hemoglobin 12.0 - 15.0 g/dL 12.4  12.9  13.1   Hematocrit 36.0 - 46.0 % 37.0  38.0  39.5   Platelets 150 - 400 K/uL 255   271.0     Lab Results  Component Value Date/Time   VD25OH 31.12 09/20/2022 11:57 AM   VD25OH 22.00 (L) 03/17/2022 09:25 AM   VITAMINB12 275 03/29/2022 08:07 AM   VITAMINB12 236 03/19/2021 03:14 PM    Clinical ASCVD: No  The 10-year ASCVD risk score  (Arnett DK, et al., 2019) is: 35.7%   Values used to calculate the score:     Age: 89 years     Sex: Female     Is Non-Hispanic African American: No     Diabetic: Yes     Tobacco smoker: No     Systolic Blood Pressure: [id number] mmHg     Is BP treated: Yes     HDL Cholesterol: 47.4 mg/dL     Total Cholesterol: 156 mg/dL    DEXA: Completed 05/12/21, showed normal bone density     07/06/2022   12:59 PM 02/16/2022    3:29 PM 09/22/2021    1:39 PM  Depression screen PHQ 2/9  Decreased Interest 0 3 0  Down, Depressed, Hopeless 0 3 0  PHQ - 2 Score 0 6 0  Altered sleeping '2 3 2  '$ Tired, decreased energy '1 2 2  '$ Change in appetite 0 3 2  Feeling bad or failure about yourself  0 3 0  Trouble concentrating 0 3 0  Moving slowly or fidgety/restless 0 3 0  Suicidal thoughts 0 3 0  PHQ-9 Score '3 26 6  '$ Difficult doing work/chores Not difficult at all Somewhat difficult Somewhat difficult     Social History   Tobacco Use  Smoking Status Former   Packs/day: 1.00   Years: 45.00   Total pack years: 45.00   Types: Cigarettes   Quit date: 04/18/2020   Years since quitting: 2.5  Smokeless Tobacco Never   BP Readings from Last 3 Encounters:  10/07/22 (!) 135/51  10/07/22 (!) 152/62  10/01/22 138/83   Pulse Readings from Last 3 Encounters:  10/07/22 78  10/07/22 79  10/01/22 63   Wt Readings from Last 3 Encounters:  10/07/22 274 lb 3.2 oz (124.4 kg)  10/07/22 272 lb 6.4 oz (123.6 kg)  10/01/22 269 lb 8 oz (122.2 kg)   BMI Readings from Last 3 Encounters:  10/07/22 44.94 kg/m  10/07/22 44.64 kg/m  10/01/22 44.17 kg/m    Allergies  Allergen Reactions   Iodine Shortness Of Breath   Keflex [Cephalexin] Shortness Of Breath, Rash and Tinitus   Shellfish Allergy Hives   Shellfish-Derived Products Anaphylaxis and Hives   Hydrocodone Hives and Itching    Itching throat   Lasix [Furosemide] Itching   Rosuvastatin Other (See Comments)    Muscle Pain in Thighs   Latex Rash    Lisinopril Cough   Sulfa Antibiotics Nausea Only   Tramadol Itching    Medications Reviewed Today     Reviewed by Levora Dredge,  Theo Dills, [facility] (Pharmacist) on 10/22/22 at 1544  Med List Status: <None>   Medication Order Taking? Sig Documenting Provider Last Dose Status Informant  albuterol (VENTOLIN HFA) 108 (90 Base) MCG/ACT inhaler [id number]  INHALE ONE OR TWO PUFFS BY MOUTH INTO THE LUNGS EVERY SIX HOURS AS NEEDED FOR WHEEZING OR SHORTNESS OF BREATH [name], Rayford Halsted, MD  Active   atorvastatin (LIPITOR) 20 MG tablet [id number]  TAKE 1 TABLET EVERY DAY  Patient taking differently: Take 20 mg by mouth at bedtime.   [name], Rayford Halsted, MD  Active Self  Continuous Blood Gluc Receiver (FREESTYLE LIBRE 3 READER) DEVI [id number]  1 each by Does not apply route daily. [name], Rayford Halsted, MD  Active   Continuous Blood Gluc Sensor (FREESTYLE LIBRE 3 SENSOR) [place] [id number]  PLACE 1 SENSOR ON THE SKIN EVERY 14 DAYS, CHECK GLUCOSE CONTINUOUSLY [name], Rayford Halsted, MD  Active   dapagliflozin propanediol (FARXIGA) 10 MG TABS tablet [id number] Yes Take 10 mg by mouth daily. 1 daily Reesa Chew, MD  Active Self  EPINEPHRINE 0.3 mg/0.3 mL IJ SOAJ injection [id number]  Inject 0.3 mg into the muscle as needed for anaphylaxis [name], Rayford Halsted, MD  Active Self  estradiol-norethindrone (ACTIVELLA) 1-0.5 MG tablet [id number]  TAKE ONE TABLET BY MOUTH ONE TIME DAILY  Patient taking differently: Take 1 tablet by mouth every other day.   [name], Rayford Halsted, MD  Active Self  ezetimibe (ZETIA) 10 MG tablet [id number]  TAKE 1 TABLET EVERY DAY [name], Rayford Halsted, MD  Active   furosemide (LASIX) 20 MG tablet [id number]  TAKE 1 TABLET BY MOUTH EVERY DAY AS NEEDED FOR LEG SWELLING [name], Rayford Halsted, MD  Active   levothyroxine (SYNTHROID) 150 MCG tablet [id number]  TAKE 1 TABLET EVERY DAY [name], Rayford Halsted, MD  Active   lidocaine-prilocaine (EMLA) cream  [id number]  Apply 1 application. topically as needed. [name], MD  Active Self  losartan (COZAAR) 50 MG tablet [id number] Yes Take 50 mg by mouth daily. Reesa Chew, MD  Active Self  pantoprazole (PROTONIX) 40 MG tablet [id number]  TAKE 1 TABLET BY MOUTH EVERY DAY [name], Rayford Halsted, MD  Active   predniSONE (DELTASONE) 50 MG tablet [id number]  Take (1) 50 mg tablet 13 hours prior, (1) 50 mg tablet 7 hours prior and (1) 50 mg tablet 1 hour prior to CT scan. Orson Slick, MD  Active   Vitamin D, Ergocalciferol, (DRISDOL) 1.25 MG (50000 UNIT) CAPS capsule [id number]  Take 1 capsule (50,000 Units total) by mouth every 7 (seven) days for 12 doses. [name], Rayford Halsted, MD  Active   XIIDRA 5 % Bailey Mech [id number]  Place 1 drop into both eyes 2 (two) times daily. [provider]  Active Self            SDOH:  (Social Determinants of Health) assessments and interventions performed: Yes SDOH Interventions    Flowsheet Row Care Coordination from 10/22/2022 in [place] [facility] from 11/16/2021 in [place] at [place] from 09/22/2021 in [place] at [place] Interventions Intervention Not Indicated -- --  Housing Interventions Intervention Not Indicated -- --  Transportation Interventions Intervention Not Indicated Intervention Not Indicated --  Utilities Interventions Intervention Not Indicated -- --  Depression Interventions/Treatment  -- -- Medication  Financial Strain Interventions Intervention Not Indicated  Intervention Not Indicated --       Medication Assistance: None required.  Patient affirms current coverage meets needs.  Medication Access: Within the past 30 days, how often has patient missed a dose of medication? None Is a pillbox or other method used to improve adherence? Yes  Factors that may affect medication adherence? no  barriers identified Are meds synced by current pharmacy? No  Are meds delivered by current pharmacy? No  Does patient experience delays in picking up medications due to transportation concerns? No   [facility] Services Reviewed: Is patient disadvantaged to use [facility]?: Yes  Current Rx insurance plan: [employer] Name and location of Current pharmacy:  [place] [place] [address] Phone: [phone] Fax: [phone]  [id number] [address] [place]Phone: [phone]: [phone] [facility] services reviewed with patient today?: No  Patient requests to transfer care to [facility]?: No  Reason patient declined to change pharmacies: Disadvantaged due to insurance/mail order  Compliance/Adherence/Medication fill history: Care Gaps: COVID Vaccine  Star-Rating Drugs: Atorvastatin '20mg'$  PDC 100% Losartan-HCTZ 50-12.'5mg'$  PDC 100%   Assessment/Plan   Hypertension (BP goal <130/80) -Controlled -Current treatment: Losartan '50mg'$  Appropriate Query Effective Lasix '20mg'$  1 qd prn leg swelling Appropriate, Effective, Safe, Accessible -Medications previously tried: Hydrochlorothiazide -Current home readings: 120/75 today -Current dietary habits: mindful of salt and processed foods -Current exercise habits: Not discussed -Denies hypotensive/hypertensive symptoms -Saw nephrologist on 2/28 and was told to stop losartan-hctz and was replaced with losartan '50mg'$ , pt plans to pick up tmrw to start. Has labwork scheduled for 3/12. -Also starting on Farxiga '10mg'$ , reviewed potential side effects and patient concerns -Educated on BP goals and benefits of medications for prevention of heart attack, stroke and kidney damage; Daily salt intake goal < 2300 mg; Exercise goal of 150 minutes per week; Importance of home blood pressure  monitoring; Symptoms of hypotension and importance of maintaining adequate hydration; -Counseled to monitor BP at home daily x 2 weeks, document, and provide log at future appointments -Counseled on diet and exercise extensively -Continue losartan '50mg'$ , make sure to stop HCTZ as instructed by nephrologist Dr. PJoylene Grapes Hyperlipidemia: (LDL goal < 70) -Uncontrolled -Current treatment: Atorvastatin '20mg'$  1 qd Appropriate, Effective, Safe, Accessible Ezetimibe '10mg'$  1 qd Appropriate, Effective, Safe, Accessible -Medications previously tried: Simvastatin  -Current dietary patterns: has changed diet to avoid processed foods -Current exercise habits: not discussed -Educated on Cholesterol goals;  Benefits of statin for ASCVD risk reduction; Importance of limiting foods high in cholesterol; -Recommended to continue current medication -If LDL is still elevated at next check, may need to consider dose increase of atorvastatin  AWeidmanPharmacist [phone]

## 2022-10-21 NOTE — Telephone Encounter (Signed)
PA started: Key: [id number]

## 2022-10-21 NOTE — Progress Notes (Signed)
Care Management & Coordination Services Pharmacy Team  Reason for Encounter: Appointment Reminder  Contacted patient to confirm telephone appointment with Burman Riis, PharmD on 10/22/2022 at 3:00. Unsuccessful outreach. Left voicemail for patient to return call.  Do you have any problems getting your medications?  If yes what types of problems are you experiencing?   What is your top health concern you would like to discuss at your upcoming visit?   Have you seen any other providers since your last visit with PCP?   Care Gaps: AWV - completed 03/14/2022, 07/08/22 message to Ramond Craver Last BP - 135/51 on 10/07/2022 Last A1C - 6.7 on 10/07/2021 Eye exam - 01/06/2022 Foot exam - 07/20/2022 Urine ACR - never done Covid - overdue Colonoscopy - postponed Mammogram - postponed   Star Rating Drugs: Atorvastatin 20 mg - last filled 09/01/2022 90 DS at [facility] Losartan HCTZ 50/12.5 mg - last filled 09/01/2022 90 DS at [facility] [phone]

## 2022-10-22 ENCOUNTER — Ambulatory Visit: Payer: Medicare HMO

## 2022-10-26 ENCOUNTER — Encounter: Payer: Self-pay | Admitting: Internal Medicine

## 2022-10-26 NOTE — Telephone Encounter (Signed)
[employer] has denied the PA. Left detailed message on machine for patient.

## 2022-10-27 DIAGNOSIS — H0102B Squamous blepharitis left eye, upper and lower eyelids: Secondary | ICD-10-CM | POA: Diagnosis not present

## 2022-10-27 DIAGNOSIS — H35033 Hypertensive retinopathy, bilateral: Secondary | ICD-10-CM | POA: Diagnosis not present

## 2022-10-27 DIAGNOSIS — H16223 Keratoconjunctivitis sicca, not specified as Sjogren's, bilateral: Secondary | ICD-10-CM | POA: Diagnosis not present

## 2022-10-27 DIAGNOSIS — H2513 Age-related nuclear cataract, bilateral: Secondary | ICD-10-CM | POA: Diagnosis not present

## 2022-10-27 LAB — HM DIABETES EYE EXAM

## 2022-11-02 DIAGNOSIS — N1832 Chronic kidney disease, stage 3b: Secondary | ICD-10-CM | POA: Diagnosis not present

## 2022-11-04 ENCOUNTER — Ambulatory Visit (HOSPITAL_COMMUNITY): Payer: Medicare HMO

## 2022-11-09 ENCOUNTER — Encounter: Payer: Self-pay | Admitting: Podiatry

## 2022-11-09 ENCOUNTER — Ambulatory Visit (INDEPENDENT_AMBULATORY_CARE_PROVIDER_SITE_OTHER): Payer: Medicare HMO | Admitting: Podiatry

## 2022-11-09 VITALS — BP 163/66

## 2022-11-09 DIAGNOSIS — M79675 Pain in left toe(s): Secondary | ICD-10-CM | POA: Diagnosis not present

## 2022-11-09 DIAGNOSIS — M79674 Pain in right toe(s): Secondary | ICD-10-CM | POA: Diagnosis not present

## 2022-11-09 DIAGNOSIS — E1142 Type 2 diabetes mellitus with diabetic polyneuropathy: Secondary | ICD-10-CM

## 2022-11-09 DIAGNOSIS — B351 Tinea unguium: Secondary | ICD-10-CM | POA: Diagnosis not present

## 2022-11-09 NOTE — Progress Notes (Unsigned)
  Subjective:  Patient ID: Maria Marquez, female    DOB: Dec 16, 1945,  MRN: [id number]  Maria Marquez presents to clinic today for {jgcomplaint:23593} No chief complaint on file.  New problem(s): None. {jgcomplaint:23593}  PCP is [name], Rayford Halsted, MD.  Allergies  Allergen Reactions   Iodine Shortness Of Breath   Keflex [Cephalexin] Shortness Of Breath, Rash and Tinitus   Shellfish Allergy Hives   Shellfish-Derived Products Anaphylaxis and Hives   Hydrocodone Hives and Itching    Itching throat   Lasix [Furosemide] Itching   Rosuvastatin Other (See Comments)    Muscle Pain in Thighs   Ciprofloxacin Other (See Comments)   Latex Rash   Lisinopril Cough   Sulfa Antibiotics Nausea Only   Tramadol Itching    Review of Systems: Negative except as noted in the HPI.  Objective: No changes noted in today's physical examination. Vitals:   11/09/22 1120  BP: (!) 163/66   Maria Marquez is a pleasant 77 y.o. female {jgbodyhabitus:24098} AAO x 3.    Assessment/Plan: No diagnosis found.  No orders of the defined types were placed in this encounter.   None {Jgplan:23602::"-Patient/POA to call should there be question/concern in the interim."}   Return in about 3 months (around 02/09/2023).  Marzetta Board, DPM

## 2022-11-13 ENCOUNTER — Other Ambulatory Visit: Payer: Self-pay | Admitting: Internal Medicine

## 2022-11-13 DIAGNOSIS — K219 Gastro-esophageal reflux disease without esophagitis: Secondary | ICD-10-CM

## 2022-11-14 ENCOUNTER — Other Ambulatory Visit: Payer: Self-pay | Admitting: Internal Medicine

## 2022-11-14 DIAGNOSIS — Z79890 Hormone replacement therapy (postmenopausal): Secondary | ICD-10-CM

## 2022-11-15 ENCOUNTER — Inpatient Hospital Stay: Payer: Medicare HMO | Attending: Oncology

## 2022-11-15 DIAGNOSIS — Z95828 Presence of other vascular implants and grafts: Secondary | ICD-10-CM

## 2022-11-15 DIAGNOSIS — Z8553 Personal history of malignant neoplasm of renal pelvis: Secondary | ICD-10-CM | POA: Insufficient documentation

## 2022-11-15 DIAGNOSIS — Z85118 Personal history of other malignant neoplasm of bronchus and lung: Secondary | ICD-10-CM | POA: Insufficient documentation

## 2022-11-15 DIAGNOSIS — Z452 Encounter for adjustment and management of vascular access device: Secondary | ICD-10-CM | POA: Diagnosis not present

## 2022-11-15 MED ORDER — SODIUM CHLORIDE 0.9% FLUSH
10.0000 mL | Freq: Once | INTRAVENOUS | Status: AC
  Administered 2022-11-15: 10 mL

## 2022-11-15 MED ORDER — HEPARIN SOD (PORK) LOCK FLUSH 100 UNIT/ML IV SOLN
500.0000 [IU] | Freq: Once | INTRAVENOUS | Status: AC
  Administered 2022-11-15: 500 [IU]

## 2022-11-21 ENCOUNTER — Other Ambulatory Visit: Payer: Self-pay | Admitting: Internal Medicine

## 2022-11-21 DIAGNOSIS — C659 Malignant neoplasm of unspecified renal pelvis: Secondary | ICD-10-CM

## 2022-11-22 DIAGNOSIS — H2513 Age-related nuclear cataract, bilateral: Secondary | ICD-10-CM | POA: Diagnosis not present

## 2022-11-22 DIAGNOSIS — H2511 Age-related nuclear cataract, right eye: Secondary | ICD-10-CM | POA: Diagnosis not present

## 2022-11-23 ENCOUNTER — Other Ambulatory Visit: Payer: Self-pay | Admitting: Hematology and Oncology

## 2022-11-23 ENCOUNTER — Ambulatory Visit (HOSPITAL_COMMUNITY)
Admission: RE | Admit: 2022-11-23 | Discharge: 2022-11-23 | Disposition: A | Discharge status: Home / Self Care | Payer: Medicare HMO | Source: Ambulatory Visit | Attending: Hematology and Oncology | Admitting: Hematology and Oncology

## 2022-11-23 DIAGNOSIS — C3490 Malignant neoplasm of unspecified part of unspecified bronchus or lung: Secondary | ICD-10-CM | POA: Diagnosis not present

## 2022-11-23 DIAGNOSIS — J432 Centrilobular emphysema: Secondary | ICD-10-CM | POA: Diagnosis not present

## 2022-11-23 LAB — POCT I-STAT CREATININE: Creatinine, Ser: 1.4 mg/dL — ABNORMAL HIGH (ref 0.44–1.00)

## 2022-11-23 MED ORDER — SODIUM CHLORIDE (PF) 0.9 % IJ SOLN
INTRAMUSCULAR | Status: AC
  Filled 2022-11-23: qty 50

## 2022-11-24 ENCOUNTER — Telehealth: Payer: Self-pay | Admitting: *Deleted

## 2022-11-24 NOTE — Telephone Encounter (Signed)
PC to patient, no answer, left VM - informed patient of Dr Libby Maw message regarding CT results as below.  Instructed patient to call this office with any questions/concerns, [phone].

## 2022-11-24 NOTE — Telephone Encounter (Signed)
-----   Message from Orson Slick, MD sent at 11/24/2022  8:34 AM EDT ----- Please let Maria Marquez know that the CT scan of her chest showed no evidence of recurent lung cancer. There was a small new nodules at 3 mm, but this is tiny. We will keep an eye on that nodule with future scans.  We will plan to see her back as scheduled in August 2024.   ----- Message ----- From: Buel Ream, Rad Results In Sent: 11/23/2022   7:27 PM EDT To: Orson Slick, MD

## 2022-12-02 ENCOUNTER — Other Ambulatory Visit: Payer: Self-pay | Admitting: Internal Medicine

## 2022-12-14 DIAGNOSIS — H2511 Age-related nuclear cataract, right eye: Secondary | ICD-10-CM | POA: Diagnosis not present

## 2022-12-14 DIAGNOSIS — H269 Unspecified cataract: Secondary | ICD-10-CM | POA: Diagnosis not present

## 2022-12-20 DIAGNOSIS — H2512 Age-related nuclear cataract, left eye: Secondary | ICD-10-CM | POA: Diagnosis not present

## 2022-12-22 ENCOUNTER — Encounter: Payer: Self-pay | Admitting: Gastroenterology

## 2022-12-23 ENCOUNTER — Encounter: Payer: Self-pay | Admitting: Internal Medicine

## 2022-12-23 ENCOUNTER — Ambulatory Visit (INDEPENDENT_AMBULATORY_CARE_PROVIDER_SITE_OTHER): Payer: Medicare HMO | Admitting: Internal Medicine

## 2022-12-23 VITALS — BP 110/78 | HR 73 | Temp 97.8°F | Wt 239.5 lb

## 2022-12-23 DIAGNOSIS — E1169 Type 2 diabetes mellitus with other specified complication: Secondary | ICD-10-CM | POA: Diagnosis not present

## 2022-12-23 DIAGNOSIS — R319 Hematuria, unspecified: Secondary | ICD-10-CM

## 2022-12-23 DIAGNOSIS — Z794 Long term (current) use of insulin: Secondary | ICD-10-CM | POA: Diagnosis not present

## 2022-12-23 LAB — POC URINALSYSI DIPSTICK (AUTOMATED)
Bilirubin, UA: NEGATIVE
Blood, UA: 3
Glucose, UA: POSITIVE — AB
Ketones, UA: NEGATIVE
Leukocytes, UA: NEGATIVE
Nitrite, UA: NEGATIVE
Protein, UA: POSITIVE — AB
Spec Grav, UA: 1.015 (ref 1.010–1.025)
Urobilinogen, UA: 0.2 E.U./dL
pH, UA: 5.5 (ref 5.0–8.0)

## 2022-12-23 NOTE — Progress Notes (Signed)
Established Patient Office Visit     CC/Reason for Visit: Sediment in urine  HPI: Maria Marquez is a 77 y.o. female who is coming in today for the above mentioned reasons. Past Medical History is significant for: Urothelial cancer.  She is being followed by urology.  She states that over the past week she has noticed increase in hematuria but also a sediment in her urine that looks like black sand and sloughed off tissue.  She does not have dysuria or increased urinary frequency.   Past Medical/Surgical History: Past Medical History:  Diagnosis Date   Anemia associated with chemotherapy    followed by dr Clelia Croft   Carcinoma of renal pelvis, right [facility])    urologist-- dr winter/  oncologist-- dr Clelia Croft;   dx 08/ 2021 ;  chemo 10/ 2021  to 12/ 2021 and complete laser tumor ablation;  recurrent 05/ 2023   completed 6 mitomyocin gel infusions via nephrostomy tube 08/ 2023   Chronic kidney disease, stage 3a (HCC) 12/31/2019   Diabetes mellitus type 2, diet-controlled (HCC)    followed by pcp;    (05-25-2022  per pt check blood sugar multiple times daily w/ Libre,  fasting average 60-100)   Dyspnea on exertion    09-27-2021  pt stated sob w/ long distancewalk and stairs but recovers quickly when stop/ rest,  ok with household chores and short walks   Family history of breast cancer 05/04/2021   Family history of ovarian cancer 05/04/2021   Generalized weakness    GERD (gastroesophageal reflux disease)    History of basal cell carcinoma (BCC) excision    per pt in 1970s on nose   History of chemotherapy    06-13-2020  to 12/ 2021  for right renal pelvis carcinoma   History of colon polyps    History of radiation therapy    11-13-2020 to 11-26-2020---   Right Lung- SBRT- Dr. Antony Blackbird   HLD (hyperlipidemia)    Hypertension    followed by pcp   Hypothyroidism, postsurgical 1979   followed by pcp   Leukocytosis    Nocturia    OA (osteoarthritis)    PONV (postoperative nausea  and vomiting)    Since chemo, ponv   Port-A-Cath in place    Primary adenocarcinoma of upper lobe of right lung [facility])    oncologist--- shadad/  pulmonology-- dr byrum;  dx 02/ 2022;  s/p  SBRT 11-13-2020 to 11-26-2020   Urgency of urination    wears pads   Varicose veins of both legs with edema    Wears partial dentures    lower    Past Surgical History:  Procedure Laterality Date   COLONOSCOPY  last one 2017   CYSTOSCOPY W/ URETERAL STENT PLACEMENT Right 01/27/2022   Procedure: CYSTOSCOPY WITH RETROGRADE PYELOGRAM/ URETEROSCOPY/POSSIBLE BIOPSY/ POSSIBLE  URETERAL STENT PLACEMENT;  Surgeon: Rene Paci, MD;  Location: [facility];  Service: Urology;  Laterality: Right;   CYSTOSCOPY WITH BIOPSY Right 09/16/2021   Procedure: CYSTOSCOPY / URETEROSCOPY WITH BIOPSY/bugbee fulgeration/right retrograde;  Surgeon: Rene Paci, MD;  [facility];  Service: Urology;  Laterality: Right;   CYSTOSCOPY WITH STENT PLACEMENT Right 10/01/2022   Procedure: CYSTOSCOPY WITH STENT PLACEMENT;  Surgeon: Rene Paci, MD;  Location: [facility];  Service: Urology;  Laterality: Right;   CYSTOSCOPY WITH URETEROSCOPY Right 04/18/2020   Procedure: CYSTOSCOPY WITH URETEROSCOPY, BIOPSY;  Surgeon: Rene Paci, MD;  Location: [facility];  Service: Urology;  Laterality: Right;  ONLY NEEDS 45 MIN   CYSTOSCOPY WITH URETEROSCOPY Right 06/02/2022   Procedure: CYSTOSCOPY WITH URETEROSCOPY;  Surgeon: Rene Paci, MD;  Location: [facility];  Service: Urology;  Laterality: Right;  ONLY NEEDS 60 MINS   CYSTOSCOPY WITH URETEROSCOPY AND STENT PLACEMENT N/A 12/31/2020   Procedure: CYSTOSCOPY WITH RIGHT URETEROSCOPY/ BILATERAL RETROGRADE PYELOGRAM/RIGHT URETERAL BIOPSY/ LASER ABLATION/RIGHT STENT PLACEMENT;  Surgeon: Rene Paci, MD;  Location: [facility];  Service: Urology;   Laterality: N/A;   CYSTOSCOPY/RETROGRADE/URETEROSCOPY Right 04/22/2021   Procedure: CYSTOSCOPY/RETROGRADE/URETEROSCOPY/ STENT PLACEMENT;  Surgeon: Rene Paci, MD;  Location: [facility];  Service: Urology;  Laterality: Right;   EYE SURGERY Bilateral 1987   tear ducts   IR IMAGING GUIDED PORT INSERTION  06/04/2020   IR NEPHROSTOMY PLACEMENT RIGHT  03/03/2022   THYROID LOBECTOMY Right 1979   TOTAL HIP ARTHROPLASTY Right 01/28/2016   Procedure: RIGHT TOTAL HIP ARTHROPLASTY ANTERIOR APPROACH;  Surgeon: Ollen Gross, MD;  Location: [facility];  Service: Orthopedics;  Laterality: Right;   UMBILICAL HERNIA REPAIR  1980s   URETEROSCOPY Right 10/01/2022   Procedure: URETEROSCOPY WITH BIOPSY pylegram;  Surgeon: Rene Paci, MD;  Location: [facility];  Service: Urology;  Laterality: Right;  45 MINS   VIDEO BRONCHOSCOPY WITH ENDOBRONCHIAL NAVIGATION N/A 06/06/2020   Procedure: VIDEO BRONCHOSCOPY WITH ENDOBRONCHIAL NAVIGATION;  Surgeon: Leslye Peer, MD;  [facility];  Service: Thoracic;  Laterality: N/A;   VIDEO BRONCHOSCOPY WITH ENDOBRONCHIAL NAVIGATION N/A 10/10/2020   Procedure: VIDEO BRONCHOSCOPY WITH ENDOBRONCHIAL NAVIGATION;  Surgeon: Loreli Slot, MD;  Location: [facility];  Service: Thoracic;  Laterality: N/A;    Social History:  reports that she quit smoking about 2 years ago. Her smoking use included cigarettes. She has a 45.00 pack-year smoking history. She has never used smokeless tobacco. She reports that she does not currently use alcohol. She reports that she does not use drugs.  Allergies: Allergies  Allergen Reactions   Iodine Shortness Of Breath   Keflex [Cephalexin] Shortness Of Breath, Rash and Tinitus   Shellfish Allergy Hives   Shellfish-Derived Products Anaphylaxis and Hives   Hydrocodone Hives and Itching    Itching throat   Lasix [Furosemide] Itching   Rosuvastatin Other (See Comments)    Muscle Pain in Thighs    Ciprofloxacin Other (See Comments)   Latex Rash   Lisinopril Cough   Sulfa Antibiotics Nausea Only   Tramadol Itching    Family History:  Family History  Problem Relation Age of Onset   Arthritis Mother    Breast cancer Mother 74   Schizophrenia Mother    Diabetes Father    Heart disease Father    Kidney disease Father    Stroke Sister    Lung cancer Brother        dx > 50; work-related exposures   Heart disease Brother    Heart disease Brother    Vision loss Brother        Glaucoma   Alcohol abuse Brother    Lung cancer Maternal Aunt        dx > 50   Colon cancer Paternal Aunt        dx > 50   Head & neck cancer Paternal Uncle        dx 18s   Ovarian cancer Daughter 35   Breast cancer Niece 70   Leukemia Cousin  d. 17s     Current Outpatient Medications:    albuterol (VENTOLIN HFA) 108 (90 Base) MCG/ACT inhaler, INHALE ONE OR TWO PUFFS BY MOUTH INTO THE LUNGS EVERY SIX HOURS AS NEEDED FOR WHEEZING OR SHORTNESS OF BREATH, Disp: 18 g, Rfl: 2   atorvastatin (LIPITOR) 20 MG tablet, TAKE 1 TABLET EVERY DAY, Disp: 90 tablet, Rfl: 1   Continuous Blood Gluc Receiver (FREESTYLE LIBRE 3 READER) DEVI, 1 each by Does not apply route daily., Disp: 1 each, Rfl: 3   Continuous Blood Gluc Sensor (FREESTYLE LIBRE 3 SENSOR) MISC, PLACE 1 SENSOR ON THE SKIN EVERY 14 DAYS, CHECK GLUCOSE CONTINUOUSLY, Disp: 2 each, Rfl: 6   dapagliflozin propanediol (FARXIGA) 10 MG TABS tablet, Take 10 mg by mouth daily. 1 daily, Disp: , Rfl:    EPINEPHRINE 0.3 mg/0.3 mL IJ SOAJ injection, INJECT 0.3MG  INTO THE MUSCLE AS NEEDED FOR ANAPHYLAXIS, Disp: 2 each, Rfl: 0   estradiol-norethindrone (ACTIVELLA) 1-0.5 MG tablet, TAKE ONE TABLET BY MOUTH ONE TIME DAILY, Disp: 28 tablet, Rfl: 2   ezetimibe (ZETIA) 10 MG tablet, TAKE 1 TABLET EVERY DAY, Disp: 90 tablet, Rfl: 3   levothyroxine (SYNTHROID) 150 MCG tablet, TAKE 1 TABLET EVERY DAY, Disp: 90 tablet, Rfl: 1   lidocaine-prilocaine (EMLA) cream, Apply  1 application. topically as needed., Disp: 30 g, Rfl: 0   losartan (COZAAR) 50 MG tablet, Take 50 mg by mouth daily., Disp: , Rfl:    pantoprazole (PROTONIX) 40 MG tablet, TAKE 1 TABLET BY MOUTH EVERY DAY, Disp: 90 tablet, Rfl: 1   XIIDRA 5 % SOLN, Place 1 drop into both eyes 2 (two) times daily., Disp: , Rfl:   Review of Systems:  Negative unless indicated in HPI.   Physical Exam: Vitals:   12/23/22 1447  BP: 110/78  Pulse: 73  Temp: 97.8 F (36.6 C)  TempSrc: Oral  SpO2: 98%  Weight: 239 lb 8 oz (108.6 kg)    Body mass index is 39.25 kg/m.   Physical Exam Vitals reviewed.  Constitutional:      Appearance: Normal appearance.  HENT:     Head: Normocephalic and atraumatic.  Eyes:     Conjunctiva/sclera: Conjunctivae normal.     Pupils: Pupils are equal, round, and reactive to light.  Skin:    General: Skin is warm and dry.  Neurological:     General: No focal deficit present.     Mental Status: She is alert and oriented to person, place, and time.  Psychiatric:        Mood and Affect: Mood normal.        Behavior: Behavior normal.        Thought Content: Thought content normal.        Judgment: Judgment normal.      Impression and Plan:  Hematuria, unspecified type -     POCT Urinalysis Dipstick (Automated) -     Urinalysis; Future -     Urine Culture; Future  Type 2 diabetes mellitus with other specified complication, without long-term current use of insulin (HCC) -     Microalbumin / creatinine urine ratio; Future   -Urine dipstick is only positive for blood.  She has a history of chronic hematuria and urothelial cancer.  I will send off for culture but I am doubtful that this is indicative of an infection.  Have advised that she contact urology for further clarification.  Time spent:22 minutes reviewing chart, interviewing and examining patient and formulating plan of care.  [name], MD [facility]

## 2022-12-24 LAB — MICROALBUMIN / CREATININE URINE RATIO
Creatinine,U: 73.5 mg/dL
Microalb Creat Ratio: 4.7 mg/g (ref 0.0–30.0)
Microalb, Ur: 3.4 mg/dL — ABNORMAL HIGH (ref 0.0–1.9)

## 2022-12-24 LAB — URINALYSIS, ROUTINE W REFLEX MICROSCOPIC
Bilirubin Urine: NEGATIVE
Ketones, ur: NEGATIVE
Leukocytes,Ua: NEGATIVE
Nitrite: NEGATIVE
Specific Gravity, Urine: 1.015 (ref 1.000–1.030)
Total Protein, Urine: NEGATIVE
Urine Glucose: >=1000 — AB
Urobilinogen, UA: 0.2 (ref 0.0–1.0)
pH: 6 (ref 5.0–8.0)

## 2022-12-25 LAB — URINE CULTURE
MICRO NUMBER:: 14905162
SPECIMEN QUALITY:: ADEQUATE

## 2022-12-27 ENCOUNTER — Encounter: Payer: Self-pay | Admitting: Hematology and Oncology

## 2022-12-27 ENCOUNTER — Inpatient Hospital Stay: Payer: Medicare HMO | Attending: Oncology

## 2022-12-27 DIAGNOSIS — Z8553 Personal history of malignant neoplasm of renal pelvis: Secondary | ICD-10-CM | POA: Diagnosis not present

## 2022-12-27 DIAGNOSIS — Z452 Encounter for adjustment and management of vascular access device: Secondary | ICD-10-CM | POA: Diagnosis not present

## 2022-12-27 DIAGNOSIS — Z85118 Personal history of other malignant neoplasm of bronchus and lung: Secondary | ICD-10-CM | POA: Insufficient documentation

## 2022-12-27 DIAGNOSIS — R8279 Other abnormal findings on microbiological examination of urine: Secondary | ICD-10-CM | POA: Diagnosis not present

## 2022-12-27 DIAGNOSIS — Z95828 Presence of other vascular implants and grafts: Secondary | ICD-10-CM

## 2022-12-27 DIAGNOSIS — C651 Malignant neoplasm of right renal pelvis: Secondary | ICD-10-CM | POA: Diagnosis not present

## 2022-12-27 MED ORDER — HEPARIN SOD (PORK) LOCK FLUSH 100 UNIT/ML IV SOLN
500.0000 [IU] | Freq: Once | INTRAVENOUS | Status: AC
  Administered 2022-12-27: 500 [IU]

## 2022-12-29 ENCOUNTER — Telehealth: Payer: Self-pay | Admitting: Pharmacy Technician

## 2022-12-29 ENCOUNTER — Encounter: Payer: Self-pay | Admitting: Hematology and Oncology

## 2022-12-29 ENCOUNTER — Other Ambulatory Visit (HOSPITAL_COMMUNITY): Payer: Self-pay

## 2022-12-29 NOTE — Telephone Encounter (Signed)
error 

## 2022-12-29 NOTE — Telephone Encounter (Signed)
Pharmacy Patient Advocate Encounter  Prior Authorization for [facility] has been approved by [employer] (ins).    PA # [id number] Effective dates: 08/23/22 through 08/23/23  Per other chart notes/pt msgs, pt has been able to get through [facility].  Pt would have to pay copay if she gets it through [facility].

## 2023-01-04 DIAGNOSIS — H2512 Age-related nuclear cataract, left eye: Secondary | ICD-10-CM | POA: Diagnosis not present

## 2023-01-11 DIAGNOSIS — I129 Hypertensive chronic kidney disease with stage 1 through stage 4 chronic kidney disease, or unspecified chronic kidney disease: Secondary | ICD-10-CM | POA: Diagnosis not present

## 2023-01-11 DIAGNOSIS — E1122 Type 2 diabetes mellitus with diabetic chronic kidney disease: Secondary | ICD-10-CM | POA: Diagnosis not present

## 2023-01-11 DIAGNOSIS — C689 Malignant neoplasm of urinary organ, unspecified: Secondary | ICD-10-CM | POA: Diagnosis not present

## 2023-01-11 DIAGNOSIS — R8290 Unspecified abnormal findings in urine: Secondary | ICD-10-CM | POA: Diagnosis not present

## 2023-01-11 DIAGNOSIS — N1832 Chronic kidney disease, stage 3b: Secondary | ICD-10-CM | POA: Diagnosis not present

## 2023-01-12 LAB — LAB REPORT - SCANNED
Albumin, Urine POC: 4.1
Creatinine, POC: 67.6 mg/dL
EGFR: 41
Microalb Creat Ratio: 6

## 2023-01-21 ENCOUNTER — Ambulatory Visit (INDEPENDENT_AMBULATORY_CARE_PROVIDER_SITE_OTHER): Payer: Medicare HMO | Admitting: Internal Medicine

## 2023-01-21 ENCOUNTER — Encounter: Payer: Self-pay | Admitting: Internal Medicine

## 2023-01-21 VITALS — BP 146/90 | HR 77 | Temp 98.2°F | Ht 66.0 in

## 2023-01-21 DIAGNOSIS — R0602 Shortness of breath: Secondary | ICD-10-CM

## 2023-01-21 DIAGNOSIS — C3491 Malignant neoplasm of unspecified part of right bronchus or lung: Secondary | ICD-10-CM | POA: Diagnosis not present

## 2023-01-21 DIAGNOSIS — Z87891 Personal history of nicotine dependence: Secondary | ICD-10-CM

## 2023-01-21 DIAGNOSIS — R600 Localized edema: Secondary | ICD-10-CM | POA: Diagnosis not present

## 2023-01-21 MED ORDER — STIOLTO RESPIMAT 2.5-2.5 MCG/ACT IN AERS: 2.0000 | INHALATION_SPRAY | Freq: Every day | RESPIRATORY_TRACT | 5 refills | Status: DC

## 2023-01-21 NOTE — Progress Notes (Signed)
Maria Marquez    [id number]    25-Jun-1946  Primary Care Physician:Hernandez Priscella Mann, MD  Referring Physician: Philip Aspen, Limmie Patricia, MD [address] [place],  [place] [address] Reason for Consultation: shortness of breath Date of Consultation: 01/21/2023  Chief complaint:   Chief Complaint  Patient presents with   Follow-up    Sob, pt was diagnosed with emphysema. Occ cough, wheezing. Sob gets worse at night while laying down.      HPI: Maria Marquez is a 77 y.o. woman who presents for new patient evaluation for COPD.   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 And then in 2022 with T1aNo adenocarcinoma of the lung in 2022. This was treated with SBRT and is now in remission.   Saw Dr. Delton Coombes in 2021 during initial diagnosis of lung cancer. She is now having some shortness of breath. She is having throat wheezing which is worse when she goes to sleep. She was prescribed an albuterol inhaler and this makes the wheezing better. She takes albuterol infrequently - sometimes not for several weeks and sometimes daily for a week. She has difficulty taking a deep breath in, gets chest heaviness, and can't expand her lungs fully.   She was diagnosed with emphysema on the CT scan for follow up for lung cancer.   Has occasional reflux takes mediation once/week over the counter.   She is less active overall than she used to be a few years ago.   She has cough with throat clearing. She does have post nasal drainage and occasionally takes over the counter anti-histamine. This helps.   She has lower extremity edema and takes occasional lasix. She has chronic venous stasis. Never had an echocardiogram.  Has gained most of her extra weight over the last 2 years with her cancer diagnoses and not taking the time to take care of herself and eat well at home.   Social history:  Occupation: worked as an Systems developer. Husband was  [place] vet who passed away from chronic illness.  Exposures: lives independently.  Smoking history: 35 years x 1/2 ppd = 17.5. quit in 2021 when she was diagnosed with cancer. She quit with patches.   Social History   Occupational History   Occupation: Retired  Tobacco Use   Smoking status: Former    Packs/day: 1.00    Years: 45.00    Additional pack years: 0.00    Total pack years: 45.00    Types: Cigarettes    Quit date: 04/18/2020    Years since quitting: 2.7   Smokeless tobacco: Never  Vaping Use   Vaping Use: Never used  Substance and Sexual Activity   Alcohol use: Not Currently    Comment: rare   Drug use: No   Sexual activity: Not on file    Relevant family history:  Family History  Problem Relation Age of Onset   Arthritis Mother    Breast cancer Mother 63   Schizophrenia Mother    Diabetes Father    Heart disease Father    Kidney disease Father    CAD Father    Stroke Sister    Lung cancer Brother        dx > 50; work-related exposures   Heart disease Brother    Heart disease Brother    Vision loss Brother        Glaucoma   Alcohol abuse Brother  Ovarian cancer Daughter 46   Lung cancer Maternal Aunt        dx > 50   Colon cancer Paternal Aunt        dx > 50   Head & neck cancer Paternal Uncle        dx 48s   Leukemia Cousin        d. 52s   Breast cancer Niece 35    Past Medical History:  Diagnosis Date   Anemia associated with chemotherapy    followed by dr Clelia Croft   Carcinoma of renal pelvis, right [facility])    urologist-- dr winter/  oncologist-- dr Clelia Croft;   dx 08/ 2021 ;  chemo 10/ 2021  to 12/ 2021 and complete laser tumor ablation;  recurrent 05/ 2023   completed 6 mitomyocin gel infusions via nephrostomy tube 08/ 2023   Chronic kidney disease, stage 3a (HCC) 12/31/2019   Diabetes mellitus type 2, diet-controlled (HCC)    followed by pcp;    (05-25-2022  per pt check blood sugar multiple times daily w/ Libre,  fasting average 60-100)    Dyspnea on exertion    09-27-2021  pt stated sob w/ long distancewalk and stairs but recovers quickly when stop/ rest,  ok with household chores and short walks   Family history of breast cancer 05/04/2021   Family history of ovarian cancer 05/04/2021   Generalized weakness    GERD (gastroesophageal reflux disease)    History of basal cell carcinoma (BCC) excision    per pt in 1970s on nose   History of chemotherapy    06-13-2020  to 12/ 2021  for right renal pelvis carcinoma   History of colon polyps    History of radiation therapy    11-13-2020 to 11-26-2020---   Right Lung- SBRT- Dr. Antony Blackbird   HLD (hyperlipidemia)    Hypertension    followed by pcp   Hypothyroidism, postsurgical 1979   followed by pcp   Leukocytosis    Nocturia    OA (osteoarthritis)    PONV (postoperative nausea and vomiting)    Since chemo, ponv   Port-A-Cath in place    Primary adenocarcinoma of upper lobe of right lung [facility])    oncologist--- shadad/  pulmonology-- dr byrum;  dx 02/ 2022;  s/p  SBRT 11-13-2020 to 11-26-2020   Urgency of urination    wears pads   Varicose veins of both legs with edema    Wears partial dentures    lower    Past Surgical History:  Procedure Laterality Date   COLONOSCOPY  last one 2017   CYSTOSCOPY W/ URETERAL STENT PLACEMENT Right 01/27/2022   Procedure: CYSTOSCOPY WITH RETROGRADE PYELOGRAM/ URETEROSCOPY/POSSIBLE BIOPSY/ POSSIBLE  URETERAL STENT PLACEMENT;  Surgeon: Rene Paci, MD;  [facility];  Service: Urology;  Laterality: Right;   CYSTOSCOPY WITH BIOPSY Right 09/16/2021   Procedure: CYSTOSCOPY / URETEROSCOPY WITH BIOPSY/bugbee fulgeration/right retrograde;  Surgeon: Rene Paci, MD;  Location: [facility];  Service: Urology;  Laterality: Right;   CYSTOSCOPY WITH STENT PLACEMENT Right 10/01/2022   Procedure: CYSTOSCOPY WITH STENT PLACEMENT;  Surgeon: Rene Paci, MD;  Location: [facility];  Service: Urology;  Laterality: Right;   CYSTOSCOPY WITH URETEROSCOPY Right 04/18/2020   Procedure: CYSTOSCOPY WITH URETEROSCOPY, BIOPSY;  Surgeon: Rene Paci, MD;  Location: [facility];  Service: Urology;  Laterality: Right;  ONLY NEEDS 45 MIN   CYSTOSCOPY WITH URETEROSCOPY Right 06/02/2022  Procedure: CYSTOSCOPY WITH URETEROSCOPY;  Surgeon: Rene Paci, MD;  Location: [facility];  Service: Urology;  Laterality: Right;  ONLY NEEDS 60 MINS   CYSTOSCOPY WITH URETEROSCOPY AND STENT PLACEMENT N/A 12/31/2020   Procedure: CYSTOSCOPY WITH RIGHT URETEROSCOPY/ BILATERAL RETROGRADE PYELOGRAM/RIGHT URETERAL BIOPSY/ LASER ABLATION/RIGHT STENT PLACEMENT;  Surgeon: Rene Paci, MD;  Location: [facility];  Service: Urology;  Laterality: N/A;   CYSTOSCOPY/RETROGRADE/URETEROSCOPY Right 04/22/2021   Procedure: CYSTOSCOPY/RETROGRADE/URETEROSCOPY/ STENT PLACEMENT;  Surgeon: Rene Paci, MD;  Location: [facility];  Service: Urology;  Laterality: Right;   EYE SURGERY Bilateral 1987   tear ducts   IR IMAGING GUIDED PORT INSERTION  06/04/2020   IR NEPHROSTOMY PLACEMENT RIGHT  03/03/2022   THYROID LOBECTOMY Right 1979   TOTAL HIP ARTHROPLASTY Right 01/28/2016   Procedure: RIGHT TOTAL HIP ARTHROPLASTY ANTERIOR APPROACH;  Surgeon: Ollen Gross, MD;  Location: [facility];  Service: Orthopedics;  Laterality: Right;   UMBILICAL HERNIA REPAIR  1980s   URETEROSCOPY Right 10/01/2022   Procedure: URETEROSCOPY WITH BIOPSY pylegram;  Surgeon: Rene Paci, MD;  Location: [facility];  Service: Urology;  Laterality: Right;  45 MINS   VIDEO BRONCHOSCOPY WITH ENDOBRONCHIAL NAVIGATION N/A 06/06/2020   Procedure: VIDEO BRONCHOSCOPY WITH ENDOBRONCHIAL NAVIGATION;  Surgeon: Leslye Peer, MD;  Location: [facility];  Service: Thoracic;  Laterality: N/A;   VIDEO BRONCHOSCOPY WITH ENDOBRONCHIAL NAVIGATION  N/A 10/10/2020   Procedure: VIDEO BRONCHOSCOPY WITH ENDOBRONCHIAL NAVIGATION;  Surgeon: Loreli Slot, MD;  Location: [facility];  Service: Thoracic;  Laterality: N/A;     Physical Exam: Blood pressure (!) 146/90, pulse 77, temperature 98.2 F (36.8 C), height 5\' 6"  (1.676 m), SpO2 99 %. Gen:      No acute distress ENT:  no nasal polyps, mucus membranes moist, mild nasal debris Lungs:    No increased respiratory effort, symmetric chest wall excursion, clear to auscultation bilaterally, no wheezes or crackles CV:         Regular rate and rhythm; no murmurs, rubs, or gallops.  No pedal edema Abd:      + bowel sounds; soft, non-tender; no distension MSK: no acute synovitis of DIP or PIP joints, no mechanics hands.  Skin:      Warm and dry; no rashes, chronic venous stasis noted with varicosities Neuro: normal speech, no focal facial asymmetry Psych: alert and oriented x3, normal mood and affect   Data Reviewed/Medical Decision Making:  Independent interpretation of tests: Imaging:  Review of patient's CT Chest April 2024 images revealed mild upper lobe predominant centrilobular emphysema. Stable post radiation changes in the RUL. The patient's images have been independently reviewed by me.    PFTs: I have personally reviewed the patient's PFTs and normal pulmonary function in 2022.     Latest Ref Rng & Units 10/09/2020   11:45 AM  PFT Results  FVC-Pre L 3.00   FVC-Predicted Pre % 94   FVC-Post L 3.00   FVC-Predicted Post % 94   Pre FEV1/FVC % % 77   Post FEV1/FCV % % 79   FEV1-Pre L 2.32   FEV1-Predicted Pre % 96   FEV1-Post L 2.36   DLCO uncorrected ml/min/mmHg 14.75   DLCO UNC% % 71   DLCO corrected ml/min/mmHg 15.94   DLCO COR %Predicted % 77   DLVA Predicted % 83   TLC L 5.79   TLC % Predicted % 108   RV % Predicted % 111  Labs:  Lab Results  Component Value Date   WBC 9.7 10/07/2022   HGB 12.4 10/07/2022   HCT 37.0 10/07/2022   MCV 90.5 10/07/2022   PLT  255 10/07/2022   Lab Results  Component Value Date   WBC 9.7 10/07/2022   HGB 12.4 10/07/2022   HCT 37.0 10/07/2022   MCV 90.5 10/07/2022   PLT 255 10/07/2022     Immunization status:  Immunization History  Administered Date(s) Administered   Fluad Quad(high Dose 65+) 04/28/2016, 06/14/2019, 07/11/2020, 05/03/2022   Influenza, High Dose Seasonal PF 04/28/2016, 07/19/2017, 05/19/2018, 05/15/2021   Influenza, Seasonal, Injecte, Preservative Fre 04/25/2015   Influenza-Unspecified 04/25/2015, 04/28/2016, 07/12/2017, 05/25/2021   Janssen (J&J) SARS-COV-2 Vaccination 04/22/2020   Moderna Sars-Covid-2 Vaccination 05/15/2021   PFIZER(Purple Top)SARS-COV-2 Vaccination 07/03/2020, 01/13/2021   Pneumococcal Conjugate-13 06/13/2015   Pneumococcal Polysaccharide-23 06/10/2016   Pneumococcal-Unspecified 08/27/2019   Tdap 06/16/2014   Zoster Recombinat (Shingrix) 01/08/2020, 05/13/2020   Zoster, Live 05/27/2014     I reviewed prior external note(s) from oncology, pulmonary  I reviewed the result(s) of the labs and imaging as noted above.   I have ordered echo  Assessment:  Dyspnea, wheezing History of tobacco use disorder Radiographic emphysema, mild centrilobular Stage 1a lung cancer of the RUL  Plan/Recommendations:  Dyspnea could be related to mild COPD. PFTs essentially normal less than 2 years ago. Consider also cardiac issues, obesity. Denies sleep apnea symptoms.   Echocardiogram  Start taking stiolto inhaler 2 puffs once daily  Take the albuterol rescue inhaler every 4 to 6 hours as needed for wheezing or shortness of breath.  Return to Care: Return in about 2 months (around 03/23/2023).  Durel Salts, MD Pulmonary and Critical Care Medicine [facility] Office:[phone]  CC: Philip Aspen, [place]*

## 2023-01-21 NOTE — Progress Notes (Signed)
The patient has been prescribed the inhaler stiolto. Inhaler technique was demonstrated to patient. The patient subsequently demonstrated correct technique.  

## 2023-01-21 NOTE — Patient Instructions (Addendum)
Please schedule follow up scheduled with myself in 2 months.  If my schedule is not open yet, we will contact you with a reminder closer to that time. Please call [phone] if you haven't heard from [name] a month before.   Before your next visit I would like you to have: Echocardiogram  - we will schedule this and call you.  Start taking stiolto inhaler 2 puffs once daily  Take the albuterol rescue inhaler every 4 to 6 hours as needed for wheezing or shortness of breath. You can also take it 15 minutes before exercise or exertional activity. Side effects include heart racing or pounding, jitters or anxiety. If you have a history of an irregular heart rhythm, it can make this worse. Can also give some patients a hard time sleeping.  Understanding COPD   What is COPD? COPD stands for chronic obstructive pulmonary (lung) disease. COPD is a general term used for several lung diseases.  COPD is an umbrella term and encompasses other  common diseases in this group like chronic bronchitis and emphysema. Chronic asthma may also be included in this group. While some patients with COPD have only chronic bronchitis or emphysema, most patients have a combination of both.  You might hear these terms used in exchange for one another.   COPD adds to the work of the heart. Diseased lungs may reduce the amount of oxygen that goes to the blood. High blood pressure in blood vessels from the heart to the lungs makes it difficult for the heart to pump. Lung disease can also cause the body to produce too many red blood cells which may make the blood thicker and harder to pump.   Patients who have COPD with low oxygen levels may develop an enlarged heart (cor pulmonale). This condition weakens the heart and causes increased shortness of breath and swelling in the legs and feet.   Chronic bronchitis Chronic bronchitis is irritation and inflammation (swelling) of the lining in the bronchial tubes (air passages). The  irritation causes coughing and an excess amount of mucus in the airways. The swelling makes it difficult to get air in and out of the lungs. The small, hair-like structures on the inside of the airways (called cilia) may be damaged by the irritation. The cilia are then unable to help clean mucus from the airways.  Bronchitis is generally considered to be chronic when you have: a productive cough (cough up mucus) and shortness of breath that lasts about 3 months or more each year for 2 or more years in a row. Your doctor may define chronic bronchitis differently.   Emphysema Emphysema is the destruction, or breakdown, of the walls of the alveoli (air sacs) located at the end of the bronchial tubes. The damaged alveoli are not able to exchange oxygen and carbon dioxide between the lungs and the blood. The bronchioles lose their elasticity and collapse when you exhale, trapping air in the lungs. The trapped air keeps fresh air and oxygen from entering the lungs.   Who is affected by COPD? Emphysema and chronic bronchitis affect approximately 16 million people in the [place], or close to 11 percent of the population.   Symptoms of COPD  Shortness of breath  Shortness of breath with mild exercise (walking, using the stairs, etc.)  Chronic, productive cough (with mucus)  A feeling of "tightness" in the chest  Wheezing   What causes COPD? The two primary causes of COPD are cigarette smoking and alpha1-antitrypsin (AAT)  deficiency. Air pollution and occupational dusts may also contribute to COPD, especially when the person exposed to these substances is a cigarette smoker.  Cigarette smoke causes COPD by irritating the airways and creating inflammation that narrows the airways, making it more difficult to breathe. Cigarette smoke also causes the cilia to stop working properly so mucus and trapped particles are not cleaned from the airways. As a result, chronic cough and excess mucus production  develop, leading to chronic bronchitis.  In some people, chronic bronchitis and infections can lead to destruction of the small airways, or emphysema.  AAT deficiency, an inherited disorder, can also lead to emphysema. Alpha antitrypsin (AAT) is a protective material produced in the liver and transported to the lungs to help combat inflammation. When there is not enough of the chemical AAT, the body is no longer protected from an enzyme in the white blood cells.   How is COPD diagnosed?  To diagnose COPD, the physician needs to know: Do you smoke?  Have you had chronic exposure to dust or air pollutants?  Do other members of your family have lung disease?  Are you short of breath?  Do you get short of breath with exercise?  Do you have chronic cough and/or wheezing?  Do you cough up excess mucus?  To help with the diagnosis, the physician will conduct a thorough physical exam which includes:  Listening to your lungs and heart  Checking your blood pressure and pulse  Examining your nose and throat  Checking your feet and ankles for swelling   Laboratory and other tests Several laboratory and other tests are needed to confirm a diagnosis of COPD. These tests may include:  Chest X-ray to look for lung changes that could be caused by COPD   Spirometry and pulmonary function tests (PFTs) to determine lung volume and air flow  Pulse oximetry to measure the saturation of oxygen in the blood  Arterial blood gases (ABGs) to determine the amount of oxygen and carbon dioxide in the blood  Exercise testing to determine if the oxygen level in the blood drops during exercise   Treatment In the beginning stages of COPD, there is minimal shortness of breath that may be noticed only during exercise. As the disease progresses, shortness of breath may worsen and you may need to wear an oxygen device.   To help control other symptoms of COPD, the following treatments and lifestyle changes may be  prescribed.  Quitting smoking  Avoiding cigarette smoke and other irritants  Taking medications including: a. bronchodilators b. anti-inflammatory agents c. oxygen d. antibiotics  Maintaining a healthy diet  Following a structured exercise program such as pulmonary rehabilitation Preventing respiratory infections  Controlling stress   If your COPD progresses, you may be eligible to be evaluated for lung volume reduction surgery or lung transplantation. You may also be eligible to participate in certain clinical trials (research studies). Ask your health care providers about studies being conducted in your hospital.   What is the outlook? Although COPD can not be cured, its symptoms can be treated and your quality of life can be improved. Your prognosis or outlook for the future will depend on how well your lungs are functioning, your symptoms, and how well you respond to and follow your treatment plan. '

## 2023-01-25 ENCOUNTER — Ambulatory Visit (INDEPENDENT_AMBULATORY_CARE_PROVIDER_SITE_OTHER): Payer: Medicare HMO | Admitting: Podiatry

## 2023-01-25 ENCOUNTER — Encounter: Payer: Self-pay | Admitting: Podiatry

## 2023-01-25 VITALS — BP 170/73

## 2023-01-25 DIAGNOSIS — E1142 Type 2 diabetes mellitus with diabetic polyneuropathy: Secondary | ICD-10-CM

## 2023-01-25 DIAGNOSIS — B351 Tinea unguium: Secondary | ICD-10-CM | POA: Diagnosis not present

## 2023-01-25 DIAGNOSIS — G43909 Migraine, unspecified, not intractable, without status migrainosus: Secondary | ICD-10-CM | POA: Insufficient documentation

## 2023-01-25 DIAGNOSIS — M79674 Pain in right toe(s): Secondary | ICD-10-CM

## 2023-01-25 DIAGNOSIS — M79675 Pain in left toe(s): Secondary | ICD-10-CM

## 2023-01-25 DIAGNOSIS — E039 Hypothyroidism, unspecified: Secondary | ICD-10-CM | POA: Insufficient documentation

## 2023-01-25 DIAGNOSIS — N289 Disorder of kidney and ureter, unspecified: Secondary | ICD-10-CM | POA: Insufficient documentation

## 2023-01-25 DIAGNOSIS — C3490 Malignant neoplasm of unspecified part of unspecified bronchus or lung: Secondary | ICD-10-CM | POA: Insufficient documentation

## 2023-01-28 ENCOUNTER — Telehealth: Payer: Self-pay

## 2023-01-28 NOTE — Progress Notes (Unsigned)
Care Management & Coordination Services Pharmacy Team  Reason for Encounter: Hypertension and Diabetes  Contacted patient to discuss hypertension disease state. {US HC Outreach:28874}  Current antihypertensive regimen:  Losartan 50 mg daily Patient verbally confirms she is taking the above medications as directed. {yes/no:20286}  How often are you checking your Blood Pressure? {CHL HP BP Monitoring Frequency:[phone]}  she checks her blood pressure {timing:25218} {before/after:25217} taking her medication.  Current home BP readings: *** DATE:             BP               PULSE   Any readings above 180/100? {yes/no:20286}  What recent interventions/DTPs have been made by any provider to improve Blood Pressure control since last CPP Visit: ***  Any recent hospitalizations or ED visits since last visit with CPP? {yes/no:20286}  What diet changes have been made to improve Blood Pressure Control?  Patient is following a low carb and low sodium diet Breakfast - patient will have greek yogurt or high protein shake Lunch - patient will have ham or [place] sandwich with keto bread Dinner - patient will have a meat with vegetables Caffeine intake: Salt intake:  What exercise is being done to improve your Blood Pressure Control?  Patient is doing a little bit of walking.   __________  Current antihyperglycemic regimen:  Farxiga 10 mg daily  Patient verbally confirms she is taking the above medications as directed. {yes/no:20286}  What diet changes have been made to improve diabetes control?  What recent interventions/DTPs have been made to improve glycemic control:  ***  Have there been any recent hospitalizations or ED visits since last visit with PharmD? {yes/no:20286}  Patient {reports/denies:24182} hypoglycemic symptoms, including {Hypoglycemic Symptoms:3049003}  Patient {reports/denies:24182} hyperglycemic symptoms, including {symptoms; hyperglycemia:17903}  How often  are you checking your blood sugar? {BG Testing frequency:23922}  What are your blood sugars ranging?  Fasting: *** After meals: ***  During the week, how often does your blood glucose drop below 70? {LowBGfrequency:24142}  Are you checking your feet daily/regularly? {yes/no:20286}  Adherence Review: Is the patient currently on ACE/ARB medication? {yes/no:20286} Does the patient have >5 day gap between last estimated fill dates? {yes/no:20286}  Care Gaps: AWV - completed 03/14/2022 Last BP - 170/73 on 01/25/2023 Last A1C - 6.7 on 10/07/2021 Eye exam - 01/06/2022 Foot exam - 07/20/2022 Colonoscopy - overdue Covid - overdue Mammogram - postponed   Star Rating Drugs: Atorvastatin 20 mg - last filled 01/27/2023 90 DS at [facility] Losartan HCTZ 50 mg - last filled 01/03/2023 90 DS at [facility]  Chart Updates: Recent office visits:  12/23/2022 Peggye Pitt MD - Patient was seen for hematuria and additional concerns. Discontinued Furosemide and Prednisone.   Recent consult visits:  01/25/2023 Geralynn Rile DPM - Patient was seen for a nail problem. No medication changes.   01/21/2023 Durel Salts MD (pulm) - Patient was seen for shortness of breath and additional concerns. Started [employer].   12/27/2022 Rhoderick Moody (urology) - Patient was seen for Malignant neoplasm of right renal pelvis. No additional chart notes.   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 No additional chart notes.   11/09/2022 Geralynn Rile DPM - Patient was seen for Pain due to onychomycosis of toenails of both feet and an additional concern.   Hospital visits:  11/15/2022 [facility] [place]  No additional chart notes.   Medications: Outpatient Encounter  Medications as of 01/28/2023  Medication Sig   albuterol (VENTOLIN HFA) 108 (90 Base) MCG/ACT inhaler INHALE ONE OR  TWO PUFFS BY MOUTH INTO THE LUNGS EVERY SIX HOURS AS NEEDED FOR WHEEZING OR SHORTNESS OF BREATH   atorvastatin (LIPITOR) 20 MG tablet TAKE 1 TABLET EVERY DAY   chlorhexidine (PERIDEX) 0.12 % solution Use as directed 5 mLs in the mouth or throat 2 (two) times daily.   Continuous Blood Gluc Receiver (FREESTYLE LIBRE 3 READER) DEVI 1 each by Does not apply route daily.   Continuous Blood Gluc Sensor (FREESTYLE LIBRE 3 SENSOR) MISC PLACE 1 SENSOR ON THE SKIN EVERY 14 DAYS, CHECK GLUCOSE CONTINUOUSLY   dapagliflozin propanediol (FARXIGA) 10 MG TABS tablet Take 10 mg by mouth daily. 1 daily   EPINEPHRINE 0.3 mg/0.3 mL IJ SOAJ injection INJECT 0.3MG  INTO THE MUSCLE AS NEEDED FOR ANAPHYLAXIS   estradiol-norethindrone (ACTIVELLA) 1-0.5 MG tablet TAKE ONE TABLET BY MOUTH ONE TIME DAILY   ezetimibe (ZETIA) 10 MG tablet TAKE 1 TABLET EVERY DAY   furosemide (LASIX) 20 MG tablet Take 20 mg by mouth daily.   levothyroxine (SYNTHROID) 150 MCG tablet TAKE 1 TABLET EVERY DAY   lidocaine-prilocaine (EMLA) cream Apply 1 application. topically as needed.   losartan (COZAAR) 50 MG tablet Take 50 mg by mouth daily.   pantoprazole (PROTONIX) 40 MG tablet TAKE 1 TABLET BY MOUTH EVERY DAY   Tiotropium Bromide-Olodaterol (STIOLTO RESPIMAT) 2.5-2.5 MCG/ACT AERS Inhale 2 puffs into the lungs daily.   XIIDRA 5 % SOLN Place 1 drop into both eyes 2 (two) times daily.   No facility-administered encounter medications on file as of 01/28/2023.  Fill History:  Dispensed Days Supply Quantity Provider Pharmacy  albuterol sulfate HFA 90 mcg/actuation aerosol inhaler 01/11/2023 25 8.5 g      Dispensed Days Supply Quantity Provider Pharmacy  atorvastatin 20 mg tablet 01/27/2023 90 90 tablet      Dispensed Days Supply Quantity Provider Pharmacy  CHLORHEXIDINE 0.12% RINSE 12/25/2022 16 473 mL      Dispensed Days Supply Quantity Provider Pharmacy  Farxiga 10 mg tablet 01/03/2023 90 90 tablet      Dispensed Days Supply Quantity  Provider Pharmacy  ezetimibe 10 mg tablet 12/02/2022 90 90 tablet      Dispensed Days Supply Quantity Provider Pharmacy  FUROSEMIDE 20 MG TABLET 12/25/2022 90 90 each      Dispensed Days Supply Quantity Provider Pharmacy  levothyroxine 150 mcg tablet 12/12/2022 90 90 tablet      Dispensed Days Supply Quantity Provider Pharmacy  lidocaine-prilocaine 2.5 %-2.5 % topical cream 12/30/2021 30 30 g      Dispensed Days Supply Quantity Provider Pharmacy  losartan 50 mg tablet 01/03/2023 90 90 tablet      Dispensed Days Supply Quantity Provider Pharmacy  PANTOPRAZOLE SOD DR 40 MG TAB 12/30/2022 90 90 each      Dispensed Days Supply Quantity Provider Pharmacy  Stiolto Respimat 2.5 mcg-2.5 mcg/actuation solution for inhalation 01/24/2023 30 4 g     Recent Office Vitals: BP Readings from Last 3 Encounters:  01/25/23 (!) 170/73  01/21/23 (!) 146/90  12/23/22 110/78   Pulse Readings from Last 3 Encounters:  01/21/23 77  12/23/22 73  10/07/22 78    Wt Readings from Last 3 Encounters:  12/23/22 239 lb 8 oz (108.6 kg)  10/07/22 274 lb 3.2 oz (124.4 kg)  10/07/22 272 lb 6.4 oz (123.6 kg)     Kidney Function Lab Results  Component Value Date/Time  CREATININE 1.40 (H) 11/23/2022 12:07 PM   CREATININE 1.27 (H) 10/07/2022 11:58 AM   CREATININE 1.30 (H) 10/01/2022 11:25 AM   CREATININE 1.36 (H) 07/21/2022 01:05 PM   GFR 40.10 (L) 09/20/2022 11:57 AM   GFRNONAA 44 (L) 10/07/2022 11:58 AM   GFRAA 51 (L) 04/15/2020 03:01 PM       Latest Ref Rng & Units 11/23/2022   12:07 PM 10/07/2022   11:58 AM 10/01/2022   11:25 AM  BMP  Glucose 70 - 99 mg/dL  413  244   BUN 8 - 23 mg/dL  30  31   Creatinine 0.10 - 1.00 mg/dL 2.72  5.36  6.44   Sodium 135 - 145 mmol/L  139  142   Potassium 3.5 - 5.1 mmol/L  3.5  3.5   Chloride 98 - 111 mmol/L  104  104   CO2 22 - 32 mmol/L  28    Calcium 8.9 - 10.3 mg/dL  9.0      Inetta Fermo [facility]  Clinical Pharmacist Assistant [phone]   Care  Management & Coordination Services Pharmacy Team  Reason for Encounter: Diabetes  Contacted patient to discuss diabetes disease state. {US HC Outreach:28874}  Current antihyperglycemic regimen:  ***  Patient verbally confirms she is taking the above medications as directed. {yes/no:20286}  What diet changes have been made to improve diabetes control?  What recent interventions/DTPs have been made to improve glycemic control:  ***  Have there been any recent hospitalizations or ED visits since last visit with PharmD? {yes/no:20286}  Patient {reports/denies:24182} hypoglycemic symptoms, including {Hypoglycemic Symptoms:3049003}  Patient {reports/denies:24182} hyperglycemic symptoms, including {symptoms; hyperglycemia:17903}  How often are you checking your blood sugar? {BG Testing frequency:23922}  What are your blood sugars ranging?  Fasting: *** Before meals: *** After meals: *** Bedtime: ***  During the week, how often does your blood glucose drop below 70? {LowBGfrequency:24142}  Are you checking your feet daily/regularly? {yes/no:20286}

## 2023-01-30 NOTE — Progress Notes (Signed)
  Subjective:  Patient ID: Maria Marquez, female    DOB: 04-06-1946,  MRN: [id number]  Maria Marquez presents to clinic today for at risk foot care with history of diabetic neuropathy and painful elongated mycotic toenails 1-5 bilaterally which are tender when wearing enclosed shoe gear. Pain is relieved with periodic professional debridement. Chief Complaint  Patient presents with   Nail Problem    DFC,Referring Provider Philip Aspen, Limmie Patricia, MD,LOV:05/24,A1C:6.7,BS:    New problem(s): None.   PCP is Philip Aspen, Limmie Patricia, MD.  Allergies  Allergen Reactions   Iodine Shortness Of Breath and Other (See Comments)   Keflex [Cephalexin] Shortness Of Breath, Rash and Tinitus   Shellfish Allergy Hives   Shellfish-Derived Products Anaphylaxis and Hives   Hydrocodone Hives and Itching    Itching throat   Lasix [Furosemide] Itching   Rosuvastatin Other (See Comments)    Muscle Pain in Thighs   Ciprofloxacin Other (See Comments)   Latex Rash   Lisinopril Cough   Sulfa Antibiotics Nausea Only   Tramadol Itching    Review of Systems: Negative except as noted in the HPI. Objective:   Constitutional Maria Marquez is a pleasant 77 y.o. female, in NAD. AAO x 3.   Vascular Vascular Examination: Capillary refill time immediate b/l. Vascular status intact b/l with palpable pedal pulses. Pedal hair present b/l. No edema. No pain with calf compression b/l. Skin temperature gradient WNL b/l. No cyanosis or clubbing b/l.   Neurological Examination:Protective sensation diminished with 10g monofilament b/l.  Dermatological Examination: Pedal skin with normal turgor, texture and tone b/l.  No open wounds. No interdigital macerations.   Toenails 1-5 b/l thick, discolored, elongated with subungual debris and pain on dorsal palpation.   No hyperkeratotic nor porokeratotic lesions present on today's visit.  Musculoskeletal Examination: Muscle strength 5/5 to all lower extremity muscle  groups bilaterally. No pain, crepitus or joint limitation noted with ROM bilateral LE. No gross bony deformities bilaterally.  Radiographs: None  Last A1c:      Latest Ref Rng & Units 10/07/2022    1:17 PM 07/06/2022   11:42 AM 03/29/2022    8:07 AM  Hemoglobin A1C  Hemoglobin-A1c 4.0 - 5.6 % 6.7  6.3  7.5        Assessment:   1. Pain due to onychomycosis of toenails of both feet   2. Type 2 diabetes mellitus with diabetic polyneuropathy, without long-term current use of insulin (HCC)    Plan:  Patient was evaluated and treated and all questions answered. Consent given for treatment as described below: -Patient to continue soft, supportive shoe gear daily. -Mycotic toenails 1-5 bilaterally were debrided in length and girth with sterile nail nippers and dremel without incident. -Patient/POA to call should there be question/concern in the interim.  Return in about 9 weeks (around 03/29/2023).  Freddie Breech, DPM

## 2023-02-07 ENCOUNTER — Inpatient Hospital Stay: Payer: Medicare HMO | Attending: Oncology

## 2023-02-07 DIAGNOSIS — Z8553 Personal history of malignant neoplasm of renal pelvis: Secondary | ICD-10-CM | POA: Insufficient documentation

## 2023-02-07 DIAGNOSIS — Z85118 Personal history of other malignant neoplasm of bronchus and lung: Secondary | ICD-10-CM | POA: Insufficient documentation

## 2023-02-07 DIAGNOSIS — Z452 Encounter for adjustment and management of vascular access device: Secondary | ICD-10-CM | POA: Insufficient documentation

## 2023-02-08 ENCOUNTER — Telehealth: Payer: Self-pay | Admitting: Hematology and Oncology

## 2023-02-08 NOTE — Telephone Encounter (Signed)
Contacted patient to scheduled appointments. Patient is aware of appointments that are scheduled.   

## 2023-02-09 ENCOUNTER — Other Ambulatory Visit: Payer: Self-pay

## 2023-02-09 ENCOUNTER — Inpatient Hospital Stay: Payer: Medicare HMO

## 2023-02-09 VITALS — BP 147/69 | HR 71 | Temp 98.1°F | Resp 16

## 2023-02-09 DIAGNOSIS — Z452 Encounter for adjustment and management of vascular access device: Secondary | ICD-10-CM | POA: Diagnosis not present

## 2023-02-09 DIAGNOSIS — Z85118 Personal history of other malignant neoplasm of bronchus and lung: Secondary | ICD-10-CM | POA: Diagnosis not present

## 2023-02-09 DIAGNOSIS — Z95828 Presence of other vascular implants and grafts: Secondary | ICD-10-CM

## 2023-02-09 DIAGNOSIS — Z8553 Personal history of malignant neoplasm of renal pelvis: Secondary | ICD-10-CM | POA: Diagnosis not present

## 2023-02-09 MED ORDER — HEPARIN SOD (PORK) LOCK FLUSH 100 UNIT/ML IV SOLN
500.0000 [IU] | Freq: Once | INTRAVENOUS | Status: AC
  Administered 2023-02-09: 500 [IU]

## 2023-02-09 MED ORDER — SODIUM CHLORIDE 0.9% FLUSH
10.0000 mL | Freq: Once | INTRAVENOUS | Status: AC
  Administered 2023-02-09: 10 mL

## 2023-02-11 ENCOUNTER — Ambulatory Visit (HOSPITAL_COMMUNITY)
Admission: RE | Admit: 2023-02-11 | Discharge: 2023-02-11 | Disposition: A | Discharge status: Home / Self Care | Payer: Medicare HMO | Source: Ambulatory Visit | Attending: Internal Medicine | Admitting: Internal Medicine

## 2023-02-11 DIAGNOSIS — Z87891 Personal history of nicotine dependence: Secondary | ICD-10-CM | POA: Diagnosis not present

## 2023-02-11 DIAGNOSIS — R600 Localized edema: Secondary | ICD-10-CM | POA: Insufficient documentation

## 2023-02-11 DIAGNOSIS — E1122 Type 2 diabetes mellitus with diabetic chronic kidney disease: Secondary | ICD-10-CM | POA: Insufficient documentation

## 2023-02-11 DIAGNOSIS — I1310 Hypertensive heart and chronic kidney disease without heart failure, with stage 1 through stage 4 chronic kidney disease, or unspecified chronic kidney disease: Secondary | ICD-10-CM | POA: Diagnosis not present

## 2023-02-11 DIAGNOSIS — R0600 Dyspnea, unspecified: Secondary | ICD-10-CM | POA: Diagnosis not present

## 2023-02-11 DIAGNOSIS — E785 Hyperlipidemia, unspecified: Secondary | ICD-10-CM | POA: Insufficient documentation

## 2023-02-11 DIAGNOSIS — N189 Chronic kidney disease, unspecified: Secondary | ICD-10-CM | POA: Insufficient documentation

## 2023-02-11 LAB — ECHOCARDIOGRAM COMPLETE
AR max vel: 2.7 cm2
AV Area VTI: 2.59 cm2
AV Area mean vel: 2.44 cm2
AV Mean grad: 3 mmHg
AV Peak grad: 4.4 mmHg
Ao pk vel: 1.05 m/s
Area-P 1/2: 3.53 cm2
Calc EF: 62.9 %
MV VTI: 2.2 cm2
S' Lateral: 3.1 cm
Single Plane A2C EF: 64.7 %
Single Plane A4C EF: 62.8 %

## 2023-02-23 ENCOUNTER — Other Ambulatory Visit: Payer: Self-pay | Admitting: Internal Medicine

## 2023-03-08 ENCOUNTER — Other Ambulatory Visit: Payer: Self-pay | Admitting: Internal Medicine

## 2023-03-21 ENCOUNTER — Inpatient Hospital Stay: Payer: Medicare HMO | Attending: Oncology

## 2023-03-21 ENCOUNTER — Other Ambulatory Visit: Payer: Self-pay

## 2023-03-21 DIAGNOSIS — Z452 Encounter for adjustment and management of vascular access device: Secondary | ICD-10-CM | POA: Insufficient documentation

## 2023-03-21 DIAGNOSIS — Z85118 Personal history of other malignant neoplasm of bronchus and lung: Secondary | ICD-10-CM | POA: Diagnosis not present

## 2023-03-21 DIAGNOSIS — Z8553 Personal history of malignant neoplasm of renal pelvis: Secondary | ICD-10-CM | POA: Insufficient documentation

## 2023-03-23 ENCOUNTER — Ambulatory Visit: Payer: Medicare HMO | Admitting: Internal Medicine

## 2023-04-02 ENCOUNTER — Other Ambulatory Visit: Payer: Self-pay | Admitting: Internal Medicine

## 2023-04-02 DIAGNOSIS — K219 Gastro-esophageal reflux disease without esophagitis: Secondary | ICD-10-CM

## 2023-04-04 ENCOUNTER — Encounter: Payer: Self-pay | Admitting: Internal Medicine

## 2023-04-04 ENCOUNTER — Ambulatory Visit (INDEPENDENT_AMBULATORY_CARE_PROVIDER_SITE_OTHER): Payer: Medicare HMO | Admitting: Internal Medicine

## 2023-04-04 VITALS — BP 110/70 | HR 77 | Temp 97.6°F | Ht 66.0 in | Wt 282.4 lb

## 2023-04-04 DIAGNOSIS — R0602 Shortness of breath: Secondary | ICD-10-CM

## 2023-04-04 DIAGNOSIS — G4719 Other hypersomnia: Secondary | ICD-10-CM | POA: Diagnosis not present

## 2023-04-04 DIAGNOSIS — J309 Allergic rhinitis, unspecified: Secondary | ICD-10-CM | POA: Diagnosis not present

## 2023-04-04 MED ORDER — FLUTICASONE PROPIONATE 50 MCG/ACT NA SUSP: 1.0000 | Freq: Every day | NASAL | 11 refills | Status: DC

## 2023-04-04 NOTE — Patient Instructions (Signed)
Please schedule follow up scheduled with myself in 3 months.  If my schedule is not open yet, we will contact you with a reminder closer to that time. Please call [phone] if you haven't heard from [name] a month before.   Ok to stop the stiolto if you feel it's not helping.   Your symptoms are concerning for sleep apnea. I am ordering a home sleep test.   For the chronic nasal drainage - try flonase(fluticasone) Nasal spray.   Flonase - 1 spray on each side of your nose twice a day for first week, then 1 spray on each side.   Instructions for use: If you also use a saline nasal spray or rinse, use that first. Position the head with the chin slightly tucked. Use the right hand to spray into the left nostril and the right hand to spray into the left nostril.   Point the bottle away from the septum of your nose (cartilage that divides the two sides of your nose).  Hold the nostril closed on the opposite side from where you will spray Spray once and gently sniff to pull the medicine into the higher parts of your nose.  Don't sniff too hard as the medicine will drain down the back of your throat instead. Repeat with a second spray on the same side if prescribed. Repeat on the other side of your nose.

## 2023-04-04 NOTE — Progress Notes (Signed)
Maria Marquez    [id number]    [birth year]  Primary Care Physician:Hernandez Priscella Mann, MD Date of Appointment: 04/04/2023 Established Patient Visit  Chief complaint:   Chief Complaint  Patient presents with   Follow-up    Stiolto makes her coughing.  Does not feel it is helping.  Wheezing mostly at night, not as bad.    HPI: Justeen Sumerlin is a 77 y.o. woman with COPD and wheezing.   Interval Updates: Here for follow up after trial of stiolto. Feels it has helped the wheezing and shortness of breath.   She can go for days and weeks and her breathing is fine. Sometimes she has an episode while at rest watching tv or while sleeping at night. The wheezing is mostly at rest and at night.   She does not take albuterol as much since she has been on the stiolto. Maybe once in the last 3 months. She feels that the wheezing is more in the upper part of her throat. She has post nasal drainage.   Needs to nap frequently.  Feels sleep is poor.  Wakes up sometimes in the middle of the night.   Epworth Sleepiness Scale  Chance of dozing off while sitting and reading? 0  1  2  3   4   2.   Chance of dozing off while watching TV?  0  1  2  3   4   3.   Chance of dozing off while Sitting, inactive in a public place?  0  1  2  3   4   4.   Chance of dozing off as a passenger in a car for an hour without a break?  0  1  2  3   4   5.   Chance of dozing off while lying down to rest in the afternoon when circumstances permit?  0  1  2  3   4   6.   Chance of dozing off while sitting and talking to someone?  0  1  2  3   4   7.   Chance of dozing off while sitting quietly after lunch without alcohol?  0  1  2  3   4   8.   Chance of dozing off while in a car, while stopped for a few minutes in traffic? 0  1  2  3   4     Total ESS 22/24   I have reviewed the patient's family social and past medical history and updated as appropriate.   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[facility])    urologist-- dr winter/  oncologist-- dr Clelia Croft;   dx 08/ 2021 ;  chemo 10/ 2021  to 12/ 2021 and complete laser tumor ablation;  recurrent 05/ 2023   completed 6 mitomyocin gel infusions via nephrostomy tube 08/ 2023   Chronic kidney disease, stage 3a (HCC) 12/31/2019   Diabetes mellitus type 2, diet-controlled (HCC)    followed by pcp;    (05-25-2022  per pt check blood sugar multiple times daily w/ Libre,  fasting average 60-100)   Dyspnea on exertion    09-27-2021  pt stated sob w/ long distancewalk and stairs but recovers quickly when stop/ rest,  ok with household chores and short walks   Family history of breast cancer 05/04/2021  Family history of ovarian cancer 05/04/2021   Generalized weakness    GERD (gastroesophageal reflux disease)    History of basal cell carcinoma (BCC) excision    per pt in 1970s on nose   History of chemotherapy    06-13-2020  to 12/ 2021  for right renal pelvis carcinoma   History of colon polyps    History of radiation therapy    11-13-2020 to 11-26-2020---   Right Lung- SBRT- Dr. Antony Blackbird   HLD (hyperlipidemia)    Hypertension    followed by pcp   Hypothyroidism, postsurgical 1979   followed by pcp   Leukocytosis    Nocturia    OA (osteoarthritis)    PONV (postoperative nausea and vomiting)    Since chemo, ponv   Port-A-Cath in place    Primary adenocarcinoma of upper lobe of right lung [facility])    oncologist--- shadad/  pulmonology-- dr byrum;  dx 02/ 2022;  s/p  SBRT 11-13-2020 to 11-26-2020   Urgency of urination    wears pads   Varicose veins of both legs with edema    Wears partial dentures    lower    Past Surgical History:  Procedure Laterality Date   COLONOSCOPY  last one 2017   CYSTOSCOPY W/ URETERAL STENT PLACEMENT Right 01/27/2022   Procedure: CYSTOSCOPY WITH RETROGRADE PYELOGRAM/ URETEROSCOPY/POSSIBLE BIOPSY/ POSSIBLE   URETERAL STENT PLACEMENT;  Surgeon: Rene Paci, MD;  Location: [facility];  Service: Urology;  Laterality: Right;   CYSTOSCOPY WITH BIOPSY Right 09/16/2021   Procedure: CYSTOSCOPY / URETEROSCOPY WITH BIOPSY/bugbee fulgeration/right retrograde;  Surgeon: Rene Paci, MD;  Location: [facility];  Service: Urology;  Laterality: Right;   CYSTOSCOPY WITH STENT PLACEMENT Right 10/01/2022   Procedure: CYSTOSCOPY WITH STENT PLACEMENT;  Surgeon: Rene Paci, MD;  Location: [facility];  Service: Urology;  Laterality: Right;   CYSTOSCOPY WITH URETEROSCOPY Right 04/18/2020   Procedure: CYSTOSCOPY WITH URETEROSCOPY, BIOPSY;  Surgeon: Rene Paci, MD;  Location: [facility];  Service: Urology;  Laterality: Right;  ONLY NEEDS 45 MIN   CYSTOSCOPY WITH URETEROSCOPY Right 06/02/2022   Procedure: CYSTOSCOPY WITH URETEROSCOPY;  Surgeon: Rene Paci, MD;  Location: [facility];  Service: Urology;  Laterality: Right;  ONLY NEEDS 60 MINS   CYSTOSCOPY WITH URETEROSCOPY AND STENT PLACEMENT N/A 12/31/2020   Procedure: CYSTOSCOPY WITH RIGHT URETEROSCOPY/ BILATERAL RETROGRADE PYELOGRAM/RIGHT URETERAL BIOPSY/ LASER ABLATION/RIGHT STENT PLACEMENT;  Surgeon: Rene Paci, MD;  Location: [facility];  Service: Urology;  Laterality: N/A;   CYSTOSCOPY/RETROGRADE/URETEROSCOPY Right 04/22/2021   Procedure: CYSTOSCOPY/RETROGRADE/URETEROSCOPY/ STENT PLACEMENT;  Surgeon: Rene Paci, MD;  Location: [facility];  Service: Urology;  Laterality: Right;   EYE SURGERY Bilateral 1987   tear ducts   IR IMAGING GUIDED PORT INSERTION  06/04/2020   IR NEPHROSTOMY PLACEMENT RIGHT  03/03/2022   THYROID LOBECTOMY Right 1979   TOTAL HIP ARTHROPLASTY Right 01/28/2016   Procedure: RIGHT TOTAL HIP ARTHROPLASTY ANTERIOR APPROACH;  Surgeon: Ollen Gross, MD;  Location: [facility];   Service: Orthopedics;  Laterality: Right;   UMBILICAL HERNIA REPAIR  1980s   URETEROSCOPY Right 10/01/2022   Procedure: URETEROSCOPY WITH BIOPSY pylegram;  Surgeon: Rene Paci, MD;  Location: [facility];  Service: Urology;  Laterality: Right;  45 MINS   VIDEO BRONCHOSCOPY WITH ENDOBRONCHIAL NAVIGATION N/A 06/06/2020   Procedure: VIDEO BRONCHOSCOPY WITH ENDOBRONCHIAL NAVIGATION;  Surgeon: Leslye Peer, MD;  [facility];  Service: Thoracic;  Laterality: N/A;   VIDEO BRONCHOSCOPY WITH ENDOBRONCHIAL NAVIGATION N/A 10/10/2020   Procedure: VIDEO BRONCHOSCOPY WITH ENDOBRONCHIAL NAVIGATION;  Surgeon: Loreli Slot, MD;  Location: [facility];  Service: Thoracic;  Laterality: N/A;    Family History  Problem Relation Age of Onset   Arthritis Mother    Breast cancer Mother 43   Schizophrenia Mother    Diabetes Father    Heart disease Father    Kidney disease Father    CAD Father    Stroke Sister    Lung cancer Brother        dx > 50; work-related exposures   Heart disease Brother    Heart disease Brother    Vision loss Brother        Glaucoma   Alcohol abuse Brother    Ovarian cancer Daughter 81   Lung cancer Maternal Aunt        dx > 50   Colon cancer Paternal Aunt        dx > 50   Head & neck cancer Paternal Uncle        dx 63s   Leukemia Cousin        d. 49s   Breast cancer Niece 48    Social History   Occupational History   Occupation: Retired  Tobacco Use   Smoking status: Former    Current packs/day: 0.00    Average packs/day: 1 pack/day for 45.0 years (45.0 ttl pk-yrs)    Types: Cigarettes    Start date: 04/19/1975    Quit date: 04/18/2020    Years since quitting: 2.9   Smokeless tobacco: Never  Vaping Use   Vaping status: Never Used  Substance and Sexual Activity   Alcohol use: Not Currently    Comment: rare   Drug use: No   Sexual activity: Not on file     Physical Exam: Blood pressure 110/70, pulse 77, temperature 97.6  F (36.4 C), temperature source Oral, height 5\' 6"  (1.676 m), weight 282 lb 6.4 oz (128.1 kg), SpO2 96%.  Gen:      No acute distress, fatigued ENT:  mallampati IV, no nasal polyps, mucus membranes moist Lungs:    No increased respiratory effort, symmetric chest wall excursion, clear to auscultation bilaterally, no wheezes or crackles CV:         Regular rate and rhythm; no murmurs, rubs, or gallops.  No pedal edema   Data Reviewed: Imaging: I have personally reviewed the CT Chest April 2024 - stable RUL post radiation changes. Stable 5mm nodule and new 3mm RUL nodule.   PFTs:     Latest Ref Rng & Units 10/09/2020   11:45 AM  PFT Results  FVC-Pre L 3.00   FVC-Predicted Pre % 94   FVC-Post L 3.00   FVC-Predicted Post % 94   Pre FEV1/FVC % % 77   Post FEV1/FCV % % 79   FEV1-Pre L 2.32   FEV1-Predicted Pre % 96   FEV1-Post L 2.36   DLCO uncorrected ml/min/mmHg 14.75   DLCO UNC% % 71   DLCO corrected ml/min/mmHg 15.94   DLCO COR %Predicted % 77   DLVA Predicted % 83   TLC L 5.79   TLC % Predicted % 108   RV % Predicted % 111    I have personally reviewed the patient's PFTs and normal pulmonary function  Labs: Lab Results  Component Value Date   WBC 9.7 10/07/2022   HGB 12.4 10/07/2022  HCT 37.0 10/07/2022   MCV 90.5 10/07/2022   PLT 255 10/07/2022   Lab Results  Component Value Date   NA 139 10/07/2022   K 3.5 10/07/2022   CL 104 10/07/2022   CO2 28 10/07/2022     Immunization status: Immunization History  Administered Date(s) Administered   Fluad Quad(high Dose 65+) 04/28/2016, 06/14/2019, 07/11/2020, 05/03/2022   Influenza, High Dose Seasonal PF 04/28/2016, 07/19/2017, 05/19/2018, 05/15/2021   Influenza, Seasonal, Injecte, Preservative Fre 04/25/2015   Influenza-Unspecified 04/25/2015, 04/28/2016, 07/12/2017, 05/25/2021, 05/03/2022   Janssen (J&J) SARS-COV-2 Vaccination 04/22/2020   Moderna Sars-Covid-2 Vaccination 05/15/2021   PFIZER(Purple  Top)SARS-COV-2 Vaccination 07/03/2020, 01/13/2021   Pneumococcal Conjugate-13 06/13/2015   Pneumococcal Polysaccharide-23 06/10/2016   Pneumococcal-Unspecified 08/27/2019   Tdap 06/16/2014   Zoster Recombinant(Shingrix) 01/08/2020, 05/13/2020   Zoster, Live 05/27/2014    External Records Personally Reviewed: oncology  Assessment:  Dyspnea, wheezing Excessive daytime sleepiness.  History of tobacco use disorder Radiographic emphysema, mild centrilobular Stage 1a lung cancer of the RUL  Plan/Recommendations:  Ok to stop the stiolto if you feel it's not helping. Most likely the wheezing is upper airway related to chronic rhinitis, LPR.   Your symptoms are concerning for sleep apnea. I am ordering a home sleep test.   For the chronic nasal drainage - try flonase(fluticasone) Nasal spray.    Return to Care: Return in about 3 months (around 07/05/2023).   Durel Salts, MD Pulmonary and Critical Care Medicine [facility] [phone]

## 2023-04-07 ENCOUNTER — Other Ambulatory Visit: Payer: Self-pay | Admitting: *Deleted

## 2023-04-07 DIAGNOSIS — K219 Gastro-esophageal reflux disease without esophagitis: Secondary | ICD-10-CM

## 2023-04-07 MED ORDER — PANTOPRAZOLE SODIUM 40 MG PO TBEC
40.0000 mg | DELAYED_RELEASE_TABLET | Freq: Every day | ORAL | 1 refills | Status: DC
Start: 2023-04-07 — End: 2023-12-01

## 2023-04-10 ENCOUNTER — Other Ambulatory Visit: Payer: Self-pay | Admitting: Internal Medicine

## 2023-04-12 DIAGNOSIS — R8290 Unspecified abnormal findings in urine: Secondary | ICD-10-CM | POA: Diagnosis not present

## 2023-04-12 DIAGNOSIS — E1122 Type 2 diabetes mellitus with diabetic chronic kidney disease: Secondary | ICD-10-CM | POA: Diagnosis not present

## 2023-04-12 DIAGNOSIS — E785 Hyperlipidemia, unspecified: Secondary | ICD-10-CM | POA: Diagnosis not present

## 2023-04-12 DIAGNOSIS — I129 Hypertensive chronic kidney disease with stage 1 through stage 4 chronic kidney disease, or unspecified chronic kidney disease: Secondary | ICD-10-CM | POA: Diagnosis not present

## 2023-04-12 DIAGNOSIS — N1832 Chronic kidney disease, stage 3b: Secondary | ICD-10-CM | POA: Diagnosis not present

## 2023-04-12 DIAGNOSIS — E039 Hypothyroidism, unspecified: Secondary | ICD-10-CM | POA: Diagnosis not present

## 2023-04-13 ENCOUNTER — Other Ambulatory Visit: Payer: Self-pay | Admitting: Internal Medicine

## 2023-04-13 DIAGNOSIS — Z79890 Hormone replacement therapy (postmenopausal): Secondary | ICD-10-CM

## 2023-04-14 ENCOUNTER — Other Ambulatory Visit: Payer: Self-pay | Admitting: Medical Genetics

## 2023-04-14 DIAGNOSIS — C651 Malignant neoplasm of right renal pelvis: Secondary | ICD-10-CM | POA: Diagnosis not present

## 2023-04-14 DIAGNOSIS — R8279 Other abnormal findings on microbiological examination of urine: Secondary | ICD-10-CM | POA: Diagnosis not present

## 2023-04-14 DIAGNOSIS — Z006 Encounter for examination for normal comparison and control in clinical research program: Secondary | ICD-10-CM

## 2023-04-17 NOTE — Progress Notes (Signed)
[facility] [phone]   [phone]  PROGRESS NOTE  Patient Care Team: Philip Aspen, Limmie Patricia, MD as PCP - General (Internal Medicine) Meryl Dare, MD as Consulting Physician (Gastroenterology) Myrlene Broker as Consulting Physician (Surgery) Ollen Gross, MD as Consulting Physician (Orthopedic Surgery) Sidney Ace, MD as Referring Physician (Allergy) Nelson Chimes, MD as Consulting Physician (Ophthalmology) Cristy Folks, PsyD as Counselor (Psychology) Alfredo Martinez, MD as Consulting Physician (Urology) Iselin, Milas Kocher, [facility] (Inactive) (Pharmacist)  Hematological/Oncological History #  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  She completed 3 cycles and cycle 4 was given without cisplatin completed in December 2021.  10/10/2020: adenocarcinoma of the lung diagnosed  11/13/2020-11/26/2020: SBRT to right lung cancer, received total of 50 Gray and 10 fractions.  04/20/2022: last visit with Dr. Clelia Croft  10/07/2022: transition care to Dr. Leonides Schanz   Interval History:  Maria Marquez 77 y.o. 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 The patient's last visit was on 10/07/2022. In the interim since the last visit she has had no major changes in her health.  On exam today Maria Marquez reports she has been well overall in the interim since her last visit.  She reports that she does have some occasional shortness of breath that occurs "without rhyme or reason".  She reports that her pulmonologist has evaluated her and said her lungs look fine.  EKG was performed and cardiac evaluation does show "a stiff heart".  She notes that  she is doing her best to try to drink water and stay hydrated.  She does that there was a recent change in her medications with the addition to HCTZ to her losartan.  She reports that she has an upcoming cystoscopy and CT scan with urology.  She reports that sometimes she urinates and does see "debris in the bowl".  Otherwise she has not had any recent infectious symptoms such as runny nose, sore throat, or cough.  She notes that she is not having any trouble with fevers, chills, sweats, nausea, vomiting or diarrhea.  A full 10 point ROS was otherwise negative.  MEDICAL HISTORY:  Past Medical History:  Diagnosis Date   Anemia associated with chemotherapy    followed by dr Leonides Schanz   Carcinoma of renal pelvis, right [facility]) 03/2020   urologist-- dr winter/  oncologist-- dr Leonides Schanz;   dx 08/ 2021 ;  chemo 10/ 2021  to 12/ 2021 and complete laser tumor ablation;  recurrent 05/ 2023   completed 6 mitomyocin gel infusions via nephrostomy tube 08/ 2023   Chronic kidney disease, stage 3a (HCC) 12/31/2019   nephrologist--- dr s. Valentino Nose;  due to chemotherapy   Chronic rhinitis    w/ upper airway wheezing due to nasal drainage per pulmonogy note (dr Celine Mans),  using stiolto inhaler   Diabetes mellitus type 2, diet-controlled (HCC)    followed by pcp;    (04-20-2023 pt has started take farxiga daily;    per pt check blood sugar multiple times daily w/ Libre,  fasting average 120--125   Dyspnea on exertion    04-20-2023 pt stated sob w/ long distancewalk and stairs but recovers quickly when stop/ rest,  ok with household chores and short walks   Family history  of breast cancer 05/04/2021   Family history of ovarian cancer 05/04/2021   Generalized weakness    GERD (gastroesophageal reflux disease)    History of basal cell carcinoma (BCC) excision    per pt in 1970s on nose   History of chemotherapy    06-13-2020  to 12/ 2021  for right renal pelvis carcinoma   History of colon polyps    History of radiation  therapy    11-13-2020 to 11-26-2020---   Right Lung- SBRT- Dr. Antony Blackbird   HLD (hyperlipidemia)    Hypertension    followed by pcp   Hypothyroidism, postsurgical 1979   followed by pcp   Leukocytosis    Nocturia    OA (osteoarthritis)    PONV (postoperative nausea and vomiting)    Since chemo, ponv   Port-A-Cath in place    Primary adenocarcinoma of upper lobe of right lung [facility]) 09/2020   oncologist--- dr Wretha Laris/  pulmonology-- dr n. Celine Mans;  dx 02/ 2022;  s/p  SBRT 11-13-2020 to 11-26-2020   Urgency of urination    wears pads   Varicose veins of both legs with edema    Wears partial dentures    lower    SURGICAL HISTORY: Past Surgical History:  Procedure Laterality Date   COLONOSCOPY  last one 2017   CYSTOSCOPY W/ URETERAL STENT PLACEMENT Right 01/27/2022   Procedure: CYSTOSCOPY WITH RETROGRADE PYELOGRAM/ URETEROSCOPY/POSSIBLE BIOPSY/ POSSIBLE  URETERAL STENT PLACEMENT;  Surgeon: Rene Paci, MD;  Location: [facility];  Service: Urology;  Laterality: Right;   CYSTOSCOPY WITH BIOPSY Right 09/16/2021   Procedure: CYSTOSCOPY / URETEROSCOPY WITH BIOPSY/bugbee fulgeration/right retrograde;  Surgeon: Rene Paci, MD;  Location: [facility];  Service: Urology;  Laterality: Right;   CYSTOSCOPY WITH STENT PLACEMENT Right 10/01/2022   Procedure: CYSTOSCOPY WITH STENT PLACEMENT;  Surgeon: Rene Paci, MD;  Location: [facility];  Service: Urology;  Laterality: Right;   CYSTOSCOPY WITH URETEROSCOPY Right 04/18/2020   Procedure: CYSTOSCOPY WITH URETEROSCOPY, BIOPSY;  Surgeon: Rene Paci, MD;  Location: [facility];  Service: Urology;  Laterality: Right;  ONLY NEEDS 45 MIN   CYSTOSCOPY WITH URETEROSCOPY Right 06/02/2022   Procedure: CYSTOSCOPY WITH URETEROSCOPY;  Surgeon: Rene Paci, MD;  Location: [facility];  Service: Urology;  Laterality: Right;  ONLY NEEDS 60 MINS    CYSTOSCOPY WITH URETEROSCOPY AND STENT PLACEMENT N/A 12/31/2020   Procedure: CYSTOSCOPY WITH RIGHT URETEROSCOPY/ BILATERAL RETROGRADE PYELOGRAM/RIGHT URETERAL BIOPSY/ LASER ABLATION/RIGHT STENT PLACEMENT;  Surgeon: Rene Paci, MD;  Location: [facility];  Service: Urology;  Laterality: N/A;   CYSTOSCOPY/RETROGRADE/URETEROSCOPY Right 04/22/2021   Procedure: CYSTOSCOPY/RETROGRADE/URETEROSCOPY/ STENT PLACEMENT;  Surgeon: Rene Paci, MD;  Location: [place];  Service: Urology;  Laterality: Right;   EYE SURGERY Bilateral 1987   tear ducts   IR IMAGING GUIDED PORT INSERTION  06/04/2020   IR NEPHROSTOMY PLACEMENT RIGHT  03/03/2022   THYROID LOBECTOMY Right 1979   TOTAL HIP ARTHROPLASTY Right 01/28/2016   Procedure: RIGHT TOTAL HIP ARTHROPLASTY ANTERIOR APPROACH;  Surgeon: Ollen Gross, MD;  Location: [facility];  Service: Orthopedics;  Laterality: Right;   UMBILICAL HERNIA REPAIR  1980s   URETEROSCOPY Right 10/01/2022   Procedure: URETEROSCOPY WITH BIOPSY pylegram;  Surgeon: Rene Paci, MD;  Location: [facility];  Service: Urology;  Laterality: Right;  45 MINS   VIDEO BRONCHOSCOPY WITH ENDOBRONCHIAL NAVIGATION N/A 06/06/2020   Procedure: VIDEO BRONCHOSCOPY WITH ENDOBRONCHIAL  NAVIGATION;  Surgeon: Leslye Peer, MD;  Location: [facility];  Service: Thoracic;  Laterality: N/A;   VIDEO BRONCHOSCOPY WITH ENDOBRONCHIAL NAVIGATION N/A 10/10/2020   Procedure: VIDEO BRONCHOSCOPY WITH ENDOBRONCHIAL NAVIGATION;  Surgeon: Loreli Slot, MD;  Location: [facility];  Service: Thoracic;  Laterality: N/A;    SOCIAL HISTORY: Social History   Socioeconomic History   Marital status: Widowed    Spouse name: Not on file   Number of children: Not on file   Years of education: Not on file   Highest education level: Associate degree: occupational, Scientist, product/process development, or vocational program  Occupational History   Occupation: Retired  Tobacco  Use   Smoking status: Former    Current packs/day: 0.00    Average packs/day: 1 pack/day for 45.0 years (45.0 ttl pk-yrs)    Types: Cigarettes    Start date: 04/19/1975    Quit date: 04/18/2020    Years since quitting: 3.0   Smokeless tobacco: Never  Vaping Use   Vaping status: Never Used  Substance and Sexual Activity   Alcohol use: Not Currently    Comment: rare   Drug use: No   Sexual activity: Not on file  Other Topics Concern   Not on file  Social History Narrative   Not on file   Social Determinants of Health   Financial Resource Strain: Low Risk  (12/21/2022)   Overall Financial Resource Strain (CARDIA)    Difficulty of Paying Living Expenses: Not hard at all  Food Insecurity: No Food Insecurity (12/21/2022)   Hunger Vital Sign    Worried About Running Out of Food in the Last Year: Never true    Ran Out of Food in the Last Year: Never true  Transportation Needs: No Transportation Needs (12/21/2022)   PRAPARE - Administrator, Civil Service (Medical): No    Lack of Transportation (Non-Medical): No  Physical Activity: Unknown (12/21/2022)   Exercise Vital Sign    Days of Exercise per Week: 0 days    Minutes of Exercise per Session: Not on file  Stress: No Stress Concern Present (12/21/2022)   [employer] of Occupational Health - Occupational Stress Questionnaire    Feeling of Stress : Not at all  Social Connections: Socially Isolated (12/21/2022)   Social Connection and Isolation Panel [NHANES]    Frequency of Communication with Friends and Family: Twice a week    Frequency of Social Gatherings with Friends and Family: Twice a week    Attends Religious Services: Never    Database administrator or Organizations: No    Attends Engineer, structural: Not on file    Marital Status: Widowed  Catering manager Violence: Not on file    FAMILY HISTORY: Family History  Problem Relation Age of Onset   Arthritis Mother    Breast cancer Mother 37    Schizophrenia Mother    Diabetes Father    Heart disease Father    Kidney disease Father    CAD Father    Stroke Sister    Lung cancer Brother        dx > 50; work-related exposures   Heart disease Brother    Heart disease Brother    Vision loss Brother        Glaucoma   Alcohol abuse Brother    Ovarian cancer Daughter 55   Lung cancer Maternal Aunt        dx > 50   Colon cancer Paternal Aunt  dx > 50   Head & neck cancer Paternal Uncle        dx 22s   Leukemia Cousin        d. 35s   Breast cancer Niece 76    ALLERGIES:  is allergic to iodine, keflex [cephalexin], shellfish allergy, shellfish-derived products, hydrocodone, lasix [furosemide], rosuvastatin, ciprofloxacin, latex, lisinopril, sulfa antibiotics, and tramadol.  MEDICATIONS:  Current Outpatient Medications  Medication Sig Dispense Refill   albuterol (VENTOLIN HFA) 108 (90 Base) MCG/ACT inhaler INHALE ONE OR TWO PUFFS BY MOUTH INTO THE LUNGS EVERY SIX HOURS AS NEEDED FOR WHEEZING OR SHORTNESS OF BREATH (Patient taking differently: Inhale 1-2 puffs into the lungs every 6 (six) hours as needed for wheezing or shortness of breath.) 18 g 2   atorvastatin (LIPITOR) 20 MG tablet TAKE 1 TABLET EVERY DAY (Patient taking differently: Take 20 mg by mouth at bedtime.) 90 tablet 1   Continuous Blood Gluc Receiver (FREESTYLE LIBRE 3 READER) DEVI 1 each by Does not apply route daily. 1 each 3   Continuous Glucose Sensor (FREESTYLE LIBRE 3 SENSOR) MISC place 1 sensor on the skin every 14 days **check glucose continuously** 2 each 3   dapagliflozin propanediol (FARXIGA) 10 MG TABS tablet Take 10 mg by mouth daily. 1 daily     EPINEPHRINE 0.3 mg/0.3 mL IJ SOAJ injection INJECT 0.3MG  INTO THE MUSCLE AS NEEDED FOR ANAPHYLAXIS (Patient taking differently: Inject 0.3 mg into the muscle as needed for anaphylaxis.) 2 each 0   estradiol-norethindrone (ACTIVELLA) 1-0.5 MG tablet TAKE ONE TABLET BY MOUTH ONE TIME DAILY (Patient taking  differently: Take 1 tablet by mouth at bedtime.) 28 tablet 0   ezetimibe (ZETIA) 10 MG tablet TAKE 1 TABLET EVERY DAY (Patient taking differently: Take 10 mg by mouth at bedtime.) 90 tablet 3   fluticasone (FLONASE) 50 MCG/ACT nasal spray Place 1 spray into both nostrils daily. (Patient taking differently: Place 1 spray into both nostrils daily as needed.) 48 each 11   furosemide (LASIX) 20 MG tablet Take 20 mg by mouth daily. (Patient not taking: Reported on 04/20/2023)     levothyroxine (SYNTHROID) 150 MCG tablet TAKE 1 TABLET EVERY DAY (Patient taking differently: Take 150 mcg by mouth at bedtime.) 90 tablet 3   lidocaine-prilocaine (EMLA) cream Apply 1 application. topically as needed. 30 g 0   losartan-hydrochlorothiazide (HYZAAR) 100-25 MG tablet Take 1 tablet by mouth daily.     Multiple Vitamins-Minerals (CENTRUM SILVER 50+WOMEN) TABS Take 2 tablets by mouth daily.     pantoprazole (PROTONIX) 40 MG tablet Take 1 tablet (40 mg total) by mouth daily. (Patient taking differently: Take 40 mg by mouth daily as needed.) 90 tablet 1   Tiotropium Bromide-Olodaterol (STIOLTO RESPIMAT) 2.5-2.5 MCG/ACT AERS Inhale 2 puffs into the lungs daily. (Patient taking differently: Inhale 2 puffs into the lungs daily after lunch.) 1 each 5   XIIDRA 5 % SOLN Place 1 drop into both eyes 2 (two) times daily.     No current facility-administered medications for this visit.    REVIEW OF SYSTEMS:   Constitutional: ( - ) fevers, ( - )  chills , ( - ) night sweats Eyes: ( - ) blurriness of vision, ( - ) double vision, ( - ) watery eyes Ears, nose, mouth, throat, and face: ( - ) mucositis, ( - ) sore throat Respiratory: ( - ) cough, ( - ) dyspnea, ( - ) wheezes Cardiovascular: ( - ) palpitation, ( - ) chest discomfort, ( - )  lower extremity swelling Gastrointestinal:  ( - ) nausea, ( - ) heartburn, ( - ) change in bowel habits Skin: ( - ) abnormal skin rashes Lymphatics: ( - ) new lymphadenopathy, ( - ) easy  bruising Neurological: ( - ) numbness, ( - ) tingling, ( - ) new weaknesses Behavioral/Psych: ( - ) mood change, ( - ) new changes  All other systems were reviewed with the patient and are negative.  PHYSICAL EXAMINATION:  Vitals:   04/18/23 1509  BP: (!) 153/49  Pulse: 70  Resp: 18  Temp: 97.8 F (36.6 C)  SpO2: 99%    Filed Weights   04/18/23 1509  Weight: 282 lb 9.6 oz (128.2 kg)     GENERAL: Well-appearing elderly Caucasian female, alert, no distress and comfortable SKIN: skin color, texture, turgor are normal, no rashes or significant lesions EYES: conjunctiva are pink and non-injected, sclera clear LUNGS: clear to auscultation and percussion with normal breathing effort HEART: regular rate & rhythm and no murmurs and no lower extremity edema Musculoskeletal: no cyanosis of digits and no clubbing  PSYCH: alert & oriented x 3, fluent speech NEURO: no focal motor/sensory deficits  LABORATORY DATA:  I have reviewed the data as listed    Latest Ref Rng & Units 04/18/2023    2:22 PM 10/07/2022   11:58 AM 10/01/2022   11:25 AM  CBC  WBC 4.0 - 10.5 K/uL 8.1  9.7    Hemoglobin 12.0 - 15.0 g/dL 16.1  09.6  04.5   Hematocrit 36.0 - 46.0 % 41.3  37.0  38.0   Platelets 150 - 400 K/uL 218  255         Latest Ref Rng & Units 04/18/2023    2:22 PM 11/23/2022   12:07 PM 10/07/2022   11:58 AM  CMP  Glucose 70 - 99 mg/dL 99   409   BUN 8 - 23 mg/dL 31   30   Creatinine 8.11 - 1.00 mg/dL 9.14  7.82  9.56   Sodium 135 - 145 mmol/L 140   139   Potassium 3.5 - 5.1 mmol/L 3.8   3.5   Chloride 98 - 111 mmol/L 105   104   CO2 22 - 32 mmol/L 27   28   Calcium 8.9 - 10.3 mg/dL 8.5   9.0   Total Protein 6.5 - 8.1 g/dL 6.6   6.6   Total Bilirubin 0.3 - 1.2 mg/dL 0.8   0.8   Alkaline Phos 38 - 126 U/L 82   71   AST 15 - 41 U/L 14   10   ALT 0 - 44 U/L 16   12     RADIOGRAPHIC STUDIES: No results found.  ASSESSMENT & PLAN Maria Marquez 77 y.o. 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 Liliane Shi  --Salvage therapy options including Pembrolizumab will be deferred for if she developed relapsed disease  --continue to be on active surveillance and repeat cystoscopy  --Labs today show white blood cell count 8.1, hemoglobin 13.5, MCV 90, platelets 218 --RTC in 6 months or sooner if signs/symptoms of recurrent disease.   #Elevated Cr # AKI -- Creatinine elevated to 1.99 today -- Patient recently  had the addition of hydrochlorothiazide to her losartan. -- Will notify her nephrologist Dr. Valentino Nose of this finding.  # T1aN0 adenocarcinoma of the lung  -- recommend CT chest q 6 months x 5 years followed by annually thereafter.  -- Continue every 6 month follow up.  Next scan due in October 2024.  Orders Placed This Encounter  Procedures   CT CHEST WO CONTRAST    Standing Status:   Future    Standing Expiration Date:   04/21/2024    Order Specific Question:   Preferred imaging location?    Answer:   [facility]    All questions were answered. The patient knows to call the clinic with any problems, questions or concerns.  A total of more than 30 minutes were spent on this encounter with face-to-face time and non-face-to-face time, including preparing to see the patient, ordering tests and/or medications, counseling the patient and coordination of care as outlined above.   Ulysees Barns, MD Department of Hematology/Oncology [facility] Phone: [phone] Pager: [phone] Email: [email or web address]  04/22/2023 11:02 AM

## 2023-04-18 ENCOUNTER — Inpatient Hospital Stay: Payer: Medicare HMO | Attending: Oncology

## 2023-04-18 ENCOUNTER — Inpatient Hospital Stay (HOSPITAL_BASED_OUTPATIENT_CLINIC_OR_DEPARTMENT_OTHER): Payer: Medicare HMO | Admitting: Hematology and Oncology

## 2023-04-18 VITALS — BP 153/49 | HR 70 | Temp 97.8°F | Resp 18 | Ht 66.0 in | Wt 282.6 lb

## 2023-04-18 DIAGNOSIS — I129 Hypertensive chronic kidney disease with stage 1 through stage 4 chronic kidney disease, or unspecified chronic kidney disease: Secondary | ICD-10-CM | POA: Diagnosis not present

## 2023-04-18 DIAGNOSIS — C659 Malignant neoplasm of unspecified renal pelvis: Secondary | ICD-10-CM | POA: Diagnosis not present

## 2023-04-18 DIAGNOSIS — Z8553 Personal history of malignant neoplasm of renal pelvis: Secondary | ICD-10-CM | POA: Insufficient documentation

## 2023-04-18 DIAGNOSIS — E1122 Type 2 diabetes mellitus with diabetic chronic kidney disease: Secondary | ICD-10-CM | POA: Insufficient documentation

## 2023-04-18 DIAGNOSIS — C3490 Malignant neoplasm of unspecified part of unspecified bronchus or lung: Secondary | ICD-10-CM | POA: Diagnosis not present

## 2023-04-18 DIAGNOSIS — Z85118 Personal history of other malignant neoplasm of bronchus and lung: Secondary | ICD-10-CM | POA: Insufficient documentation

## 2023-04-18 DIAGNOSIS — Z452 Encounter for adjustment and management of vascular access device: Secondary | ICD-10-CM | POA: Diagnosis not present

## 2023-04-18 DIAGNOSIS — N1831 Chronic kidney disease, stage 3a: Secondary | ICD-10-CM | POA: Insufficient documentation

## 2023-04-18 DIAGNOSIS — Z95828 Presence of other vascular implants and grafts: Secondary | ICD-10-CM

## 2023-04-18 DIAGNOSIS — R7989 Other specified abnormal findings of blood chemistry: Secondary | ICD-10-CM | POA: Diagnosis not present

## 2023-04-18 LAB — CBC WITH DIFFERENTIAL (CANCER CENTER ONLY)
Abs Immature Granulocytes: 0.02 10*3/uL (ref 0.00–0.07)
Basophils Absolute: 0 10*3/uL (ref 0.0–0.1)
Basophils Relative: 1 %
Eosinophils Absolute: 0.2 10*3/uL (ref 0.0–0.5)
Eosinophils Relative: 3 %
HCT: 41.3 % (ref 36.0–46.0)
Hemoglobin: 13.5 g/dL (ref 12.0–15.0)
Immature Granulocytes: 0 %
Lymphocytes Relative: 24 %
Lymphs Abs: 1.9 10*3/uL (ref 0.7–4.0)
MCH: 29.4 pg (ref 26.0–34.0)
MCHC: 32.7 g/dL (ref 30.0–36.0)
MCV: 90 fL (ref 80.0–100.0)
Monocytes Absolute: 0.7 10*3/uL (ref 0.1–1.0)
Monocytes Relative: 9 %
Neutro Abs: 5.2 10*3/uL (ref 1.7–7.7)
Neutrophils Relative %: 63 %
Platelet Count: 218 10*3/uL (ref 150–400)
RBC: 4.59 MIL/uL (ref 3.87–5.11)
RDW: 14.7 % (ref 11.5–15.5)
WBC Count: 8.1 10*3/uL (ref 4.0–10.5)
nRBC: 0 % (ref 0.0–0.2)

## 2023-04-18 LAB — CMP (CANCER CENTER ONLY)
ALT: 16 U/L (ref 0–44)
AST: 14 U/L — ABNORMAL LOW (ref 15–41)
Albumin: 3.9 g/dL (ref 3.5–5.0)
Alkaline Phosphatase: 82 U/L (ref 38–126)
Anion gap: 8 (ref 5–15)
BUN: 31 mg/dL — ABNORMAL HIGH (ref 8–23)
CO2: 27 mmol/L (ref 22–32)
Calcium: 8.5 mg/dL — ABNORMAL LOW (ref 8.9–10.3)
Chloride: 105 mmol/L (ref 98–111)
Creatinine: 1.99 mg/dL — ABNORMAL HIGH (ref 0.44–1.00)
GFR, Estimated: 26 mL/min — ABNORMAL LOW (ref >60)
Glucose, Bld: 99 mg/dL (ref 70–99)
Potassium: 3.8 mmol/L (ref 3.5–5.1)
Sodium: 140 mmol/L (ref 135–145)
Total Bilirubin: 0.8 mg/dL (ref 0.3–1.2)
Total Protein: 6.6 g/dL (ref 6.5–8.1)

## 2023-04-18 MED ORDER — SODIUM CHLORIDE 0.9% FLUSH
10.0000 mL | Freq: Once | INTRAVENOUS | Status: AC
  Administered 2023-04-18: 10 mL

## 2023-04-18 MED ORDER — HEPARIN SOD (PORK) LOCK FLUSH 100 UNIT/ML IV SOLN
500.0000 [IU] | Freq: Once | INTRAVENOUS | Status: AC
  Administered 2023-04-18: 500 [IU]

## 2023-04-19 ENCOUNTER — Other Ambulatory Visit: Payer: Self-pay | Admitting: Urology

## 2023-04-20 ENCOUNTER — Other Ambulatory Visit: Payer: Self-pay | Admitting: Urology

## 2023-04-20 ENCOUNTER — Encounter (HOSPITAL_BASED_OUTPATIENT_CLINIC_OR_DEPARTMENT_OTHER): Payer: Self-pay | Admitting: Urology

## 2023-04-20 NOTE — Progress Notes (Signed)
Spoke w/ via phone for pre-op interview--- pt Lab needs dos----    istat , EKG           Lab results------ labs results in epic dated 04-18-2023 CBC/ CMP COVID test -----patient states asymptomatic no test needed Arrive at ------- 0745 on 04-27-2023 NPO after MN NO Solid Food.  Clear liquids from MN until--- 0645 Med rec completed Medications to take morning of surgery ----- protonix Diabetic medication ----- do not take farxiga morning of surgery Patient instructed no nail polish to be worn day of surgery Patient instructed to bring photo id and insurance card day of surgery Patient aware to have Driver (ride ) / caregiver    for 24 hours after surgery -- son, billy/ daughter, tracey Patient Special Instructions ----- asked to bring rescue inhaler dos Pre-Op special Instructions -----  pt has Libre 3 on right upper arm Patient verbalized understanding of instructions that were given at this phone interview. Patient denies shortness of breath, chest pain, fever, cough at this phone interview.   PCP:  Dr Jeanella Flattery (lov 10-07-2022) Oncologist:  Dr Leonides Schanz Theron Arista 04-18-2023) Pulmonology:  Dr Dorris Carnes. Celine Mans [facility] 04-04-2023 Nephrologist:  Dr Kathie Rhodes. Denyse Amass 04-12-2023 note w/ chart) Chest CT:  11-23-2022 Echo:  02-11-2023 EKG:  02-15-2022 Checks blood sugars multiple times/  fasting average:  120--125 Activity:  per pt sob w/ long distance walk and stairs but recovers quickly when stop/ rest, ok with Household chores and short walks

## 2023-04-22 ENCOUNTER — Encounter: Payer: Self-pay | Admitting: Hematology and Oncology

## 2023-04-26 DIAGNOSIS — I129 Hypertensive chronic kidney disease with stage 1 through stage 4 chronic kidney disease, or unspecified chronic kidney disease: Secondary | ICD-10-CM | POA: Diagnosis not present

## 2023-04-26 DIAGNOSIS — N1832 Chronic kidney disease, stage 3b: Secondary | ICD-10-CM | POA: Diagnosis not present

## 2023-04-27 ENCOUNTER — Encounter (HOSPITAL_BASED_OUTPATIENT_CLINIC_OR_DEPARTMENT_OTHER): Payer: Self-pay | Admitting: Urology

## 2023-04-27 ENCOUNTER — Ambulatory Visit (HOSPITAL_BASED_OUTPATIENT_CLINIC_OR_DEPARTMENT_OTHER): Payer: Medicare HMO | Admitting: Anesthesiology

## 2023-04-27 ENCOUNTER — Encounter (HOSPITAL_BASED_OUTPATIENT_CLINIC_OR_DEPARTMENT_OTHER)
Admission: RE | Disposition: A | Discharge status: Home / Self Care | Payer: Self-pay | Source: Home / Self Care | Attending: Urology

## 2023-04-27 ENCOUNTER — Other Ambulatory Visit: Payer: Self-pay

## 2023-04-27 ENCOUNTER — Ambulatory Visit (HOSPITAL_BASED_OUTPATIENT_CLINIC_OR_DEPARTMENT_OTHER)
Admission: RE | Admit: 2023-04-27 | Discharge: 2023-04-27 | Disposition: A | Discharge status: Home / Self Care | Payer: Medicare HMO | Attending: Urology | Admitting: Urology

## 2023-04-27 DIAGNOSIS — E039 Hypothyroidism, unspecified: Secondary | ICD-10-CM | POA: Diagnosis not present

## 2023-04-27 DIAGNOSIS — I129 Hypertensive chronic kidney disease with stage 1 through stage 4 chronic kidney disease, or unspecified chronic kidney disease: Secondary | ICD-10-CM | POA: Diagnosis not present

## 2023-04-27 DIAGNOSIS — I10 Essential (primary) hypertension: Secondary | ICD-10-CM | POA: Insufficient documentation

## 2023-04-27 DIAGNOSIS — Z85118 Personal history of other malignant neoplasm of bronchus and lung: Secondary | ICD-10-CM | POA: Diagnosis not present

## 2023-04-27 DIAGNOSIS — Z87891 Personal history of nicotine dependence: Secondary | ICD-10-CM | POA: Diagnosis not present

## 2023-04-27 DIAGNOSIS — E119 Type 2 diabetes mellitus without complications: Secondary | ICD-10-CM | POA: Diagnosis not present

## 2023-04-27 DIAGNOSIS — C651 Malignant neoplasm of right renal pelvis: Secondary | ICD-10-CM | POA: Insufficient documentation

## 2023-04-27 DIAGNOSIS — E785 Hyperlipidemia, unspecified: Secondary | ICD-10-CM

## 2023-04-27 DIAGNOSIS — Z923 Personal history of irradiation: Secondary | ICD-10-CM | POA: Insufficient documentation

## 2023-04-27 DIAGNOSIS — Z9221 Personal history of antineoplastic chemotherapy: Secondary | ICD-10-CM | POA: Diagnosis not present

## 2023-04-27 DIAGNOSIS — F17210 Nicotine dependence, cigarettes, uncomplicated: Secondary | ICD-10-CM | POA: Diagnosis not present

## 2023-04-27 DIAGNOSIS — N1831 Chronic kidney disease, stage 3a: Secondary | ICD-10-CM | POA: Diagnosis not present

## 2023-04-27 DIAGNOSIS — Z01818 Encounter for other preprocedural examination: Secondary | ICD-10-CM

## 2023-04-27 HISTORY — DX: Chronic rhinitis: J31.0

## 2023-04-27 HISTORY — PX: CYSTOSCOPY WITH RETROGRADE PYELOGRAM, URETEROSCOPY AND STENT PLACEMENT: SHX5789

## 2023-04-27 LAB — POCT I-STAT, CHEM 8
BUN: 38 mg/dL — ABNORMAL HIGH (ref 8–23)
Calcium, Ion: 1.14 mmol/L — ABNORMAL LOW (ref 1.15–1.40)
Chloride: 101 mmol/L (ref 98–111)
Creatinine, Ser: 1.7 mg/dL — ABNORMAL HIGH (ref 0.44–1.00)
Glucose, Bld: 158 mg/dL — ABNORMAL HIGH (ref 70–99)
HCT: 42 % (ref 36.0–46.0)
Hemoglobin: 14.3 g/dL (ref 12.0–15.0)
Potassium: 3.2 mmol/L — ABNORMAL LOW (ref 3.5–5.1)
Sodium: 139 mmol/L (ref 135–145)
TCO2: 23 mmol/L (ref 22–32)

## 2023-04-27 LAB — GLUCOSE, CAPILLARY: Glucose-Capillary: 130 mg/dL — ABNORMAL HIGH (ref 70–99)

## 2023-04-27 SURGERY — CYSTOURETEROSCOPY, WITH RETROGRADE PYELOGRAM AND STENT INSERTION
Anesthesia: General | Site: Ureter | Laterality: Right

## 2023-04-27 MED ORDER — PHENYLEPHRINE 80 MCG/ML (10ML) SYRINGE FOR IV PUSH (FOR BLOOD PRESSURE SUPPORT)
PREFILLED_SYRINGE | INTRAVENOUS | Status: DC | PRN
  Administered 2023-04-27 (×3): 80 ug via INTRAVENOUS

## 2023-04-27 MED ORDER — LIDOCAINE HCL (PF) 2 % IJ SOLN
INTRAMUSCULAR | Status: AC
  Filled 2023-04-27: qty 5

## 2023-04-27 MED ORDER — PHENYLEPHRINE 80 MCG/ML (10ML) SYRINGE FOR IV PUSH (FOR BLOOD PRESSURE SUPPORT)
PREFILLED_SYRINGE | INTRAVENOUS | Status: AC
  Filled 2023-04-27: qty 10

## 2023-04-27 MED ORDER — SODIUM CHLORIDE 0.9 % IV SOLN: INTRAVENOUS | Status: DC

## 2023-04-27 MED ORDER — FENTANYL CITRATE (PF) 100 MCG/2ML IJ SOLN: 25.0000 ug | INTRAMUSCULAR | Status: DC | PRN

## 2023-04-27 MED ORDER — FENTANYL CITRATE (PF) 100 MCG/2ML IJ SOLN
INTRAMUSCULAR | Status: AC
  Filled 2023-04-27: qty 2

## 2023-04-27 MED ORDER — PROPOFOL 10 MG/ML IV BOLUS
INTRAVENOUS | Status: AC
  Filled 2023-04-27: qty 20

## 2023-04-27 MED ORDER — ONDANSETRON HCL 4 MG/2ML IJ SOLN
INTRAMUSCULAR | Status: DC | PRN
  Administered 2023-04-27: 4 mg via INTRAVENOUS

## 2023-04-27 MED ORDER — STERILE WATER FOR IRRIGATION IR SOLN
Status: DC | PRN
Start: 2023-04-27 — End: 2023-04-27
  Administered 2023-04-27: 3000 mL

## 2023-04-27 MED ORDER — PROPOFOL 10 MG/ML IV BOLUS
INTRAVENOUS | Status: DC | PRN
  Administered 2023-04-27: 200 mg via INTRAVENOUS
  Administered 2023-04-27: 50 mg via INTRAVENOUS

## 2023-04-27 MED ORDER — CLINDAMYCIN PHOSPHATE 900 MG/50ML IV SOLN
900.0000 mg | Freq: Once | INTRAVENOUS | Status: AC
  Administered 2023-04-27: 900 mg via INTRAVENOUS

## 2023-04-27 MED ORDER — SODIUM CHLORIDE 0.9 % IR SOLN
Status: DC | PRN
  Administered 2023-04-27: 3000 mL

## 2023-04-27 MED ORDER — ONDANSETRON HCL 4 MG/2ML IJ SOLN: 4.0000 mg | Freq: Four times a day (QID) | INTRAMUSCULAR | Status: DC | PRN

## 2023-04-27 MED ORDER — IOHEXOL 300 MG/ML  SOLN
INTRAMUSCULAR | Status: DC | PRN
  Administered 2023-04-27: 10 mL via URETHRAL

## 2023-04-27 MED ORDER — 0.9 % SODIUM CHLORIDE (POUR BTL) OPTIME
TOPICAL | Status: DC | PRN
Start: 2023-04-27 — End: 2023-04-27
  Administered 2023-04-27: 500 mL

## 2023-04-27 MED ORDER — PHENAZOPYRIDINE HCL 200 MG PO TABS: 200.0000 mg | ORAL_TABLET | Freq: Three times a day (TID) | ORAL | 0 refills | Status: AC | PRN

## 2023-04-27 MED ORDER — ONDANSETRON HCL 4 MG/2ML IJ SOLN
INTRAMUSCULAR | Status: AC
  Filled 2023-04-27: qty 2

## 2023-04-27 MED ORDER — OXYCODONE HCL 5 MG PO TABS: 5.0000 mg | ORAL_TABLET | Freq: Once | ORAL | Status: DC | PRN

## 2023-04-27 MED ORDER — LIDOCAINE 2% (20 MG/ML) 5 ML SYRINGE
INTRAMUSCULAR | Status: DC | PRN
  Administered 2023-04-27: 60 mg via INTRAVENOUS

## 2023-04-27 MED ORDER — OXYCODONE HCL 5 MG/5ML PO SOLN: 5.0000 mg | Freq: Once | ORAL | Status: DC | PRN

## 2023-04-27 MED ORDER — FENTANYL CITRATE (PF) 100 MCG/2ML IJ SOLN
INTRAMUSCULAR | Status: DC | PRN
  Administered 2023-04-27: 50 ug via INTRAVENOUS
  Administered 2023-04-27: 25 ug via INTRAVENOUS

## 2023-04-27 MED ORDER — OXYBUTYNIN CHLORIDE 5 MG PO TABS
5.0000 mg | ORAL_TABLET | Freq: Three times a day (TID) | ORAL | 1 refills | Status: AC | PRN
Start: 2023-04-27 — End: ?

## 2023-04-27 MED ORDER — CLINDAMYCIN PHOSPHATE 900 MG/50ML IV SOLN
INTRAVENOUS | Status: AC
  Filled 2023-04-27: qty 50

## 2023-04-27 SURGICAL SUPPLY — 25 items
BAG DRAIN URO-CYSTO SKYTR STRL (DRAIN) ×1 IMPLANT
BAG DRN UROCATH (DRAIN) ×1
CABLE HIGH FREQUENCY MONO STRZ (ELECTRODE) IMPLANT
CATH URETL OPEN 5X70 (CATHETERS) IMPLANT
CLOTH BEACON ORANGE TIMEOUT ST (SAFETY) ×1 IMPLANT
ELECT COAG BALL END 3FR (ELECTROSURGICAL) ×1
ELECTRODE COAG BALL END 3FR (ELECTROSURGICAL) IMPLANT
GLOVE BIOGEL PI IND STRL 6 (GLOVE) IMPLANT
GLOVE BIOGEL PI IND STRL 7.0 (GLOVE) IMPLANT
GLOVE SURG SS PI 7.5 STRL IVOR (GLOVE) IMPLANT
GOWN STRL REUS W/TWL LRG LVL3 (GOWN DISPOSABLE) IMPLANT
GOWN STRL REUS W/TWL XL LVL3 (GOWN DISPOSABLE) ×1 IMPLANT
GUIDEWIRE ZIPWRE .038 STRAIGHT (WIRE) ×1 IMPLANT
HIBICLENS CHG 4% 4OZ (MISCELLANEOUS) IMPLANT
IV NS IRRIG 3000ML ARTHROMATIC (IV SOLUTION) ×2 IMPLANT
KIT TURNOVER CYSTO (KITS) ×1 IMPLANT
MANIFOLD NEPTUNE II (INSTRUMENTS) ×1 IMPLANT
NS IRRIG 500ML POUR BTL (IV SOLUTION) ×1 IMPLANT
PACK CYSTO (CUSTOM PROCEDURE TRAY) ×1 IMPLANT
SLEEVE SCD COMPRESS KNEE MED (STOCKING) ×1 IMPLANT
STENT URET 6FRX24 CONTOUR (STENTS) IMPLANT
SYR 10ML LL (SYRINGE) ×1 IMPLANT
TUBE CONNECTING 12X1/4 (SUCTIONS) IMPLANT
TUBING UROLOGY SET (TUBING) ×1 IMPLANT
WATER STERILE IRR 3000ML UROMA (IV SOLUTION) IMPLANT

## 2023-04-27 NOTE — Anesthesia Postprocedure Evaluation (Signed)
Anesthesia Post Note  Patient: Maria Marquez  Procedure(s) Performed: CYSTOSCOPY, WITH RIGHT RETROGRADE PYELOGRAM, RIGHT URETEROSCOPY, RIGHT URETERAL STENT PLACEMENT, FULGURATION OF RIGHT RENAL PELVIS TUMORS (Right: Ureter)     Patient location during evaluation: PACU Anesthesia Type: General Level of consciousness: awake and alert Pain management: pain level controlled Vital Signs Assessment: post-procedure vital signs reviewed and stable Respiratory status: spontaneous breathing, nonlabored ventilation, respiratory function stable and patient connected to nasal cannula oxygen Cardiovascular status: blood pressure returned to baseline and stable Postop Assessment: no apparent nausea or vomiting Anesthetic complications: no   No notable events documented.  Last Vitals:  Vitals:   04/27/23 1100 04/27/23 1130  BP: (!) 141/65 126/71  Pulse: 69 72  Resp: (!) 21 16  Temp:  (!) 36.3 C  SpO2: 96% 100%    Last Pain:  Vitals:   04/27/23 1130  TempSrc:   PainSc: 0-No pain                 Jessicah Croll S

## 2023-04-27 NOTE — H&P (Signed)
Office Visit Report     04/14/2023   --------------------------------------------------------------------------------   Maria Marquez  MRN: [id number]  DOB: 1946/08/15, 77 year old Female  SSN:    PRIMARY CARE:  Limmie Patricia. Philip Aspen, MD  PRIMARY CARE FAX:  [phone]  REFERRING:  Si Raider. Liliane Shi, MD  PROVIDER:  Rhoderick Moody, M.D.  LOCATION:  [facility] [phone]     --------------------------------------------------------------------------------   CC/HPI: Right ureteral UCC   HPI: Maria Marquez is a 77 year old female, referred by Dr. Sherron Monday after she was found to have a 2.7 cm solid and enhancing lesion in the proximal aspects of the right ureter on CT during a work-up for gross hematuria. She was also noted to have a 13 mm left renal lesion, likely representing a proteinaceous/hyperdense cyst. She is s/p diagnostic right ureteroscopy on 04/18/20 and was found to have multiple large papillary tumors throughout the right renal pelvis/UPJ. 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 She is s/p gem/cis chemotherapy and radiation to the thorax. Negative cysto/ureteroscopy on 09/16/21. She underwent surveillance cystoscopy/ureteroscopy on 01/27/2022 and was found to have multiple small papillary bladder tumors within the right renal pelvis, right ureter and bladder. All identifiable tumor burden was fulgurated at that time. 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 Follow-up surveillance ureteroscopy in February 2024 showed small mucosal abnormalities in the right distal ureter and renal pelvis that were biopsied and ultimately found to be negative for UCC recurrence.   11/24/20: The patient is here today for a routine follow-up. She has completed chemotherapy and is near completion of radiation therapy for her primary lung  cancer. PET CT from January revealed no intra-abdominal lesions and no GU abnormalities. She is urinating without difficulty and denies interval UTIs, dysuria or hematuria. Of note, since starting chemotherapy, her renal function has progressively declined with her last eGFR being 38 (serum creatinine- 1.45).   01/16/21: The patient is here today for a routine follow-up and stent removal. She is s/p right URS with ablation of a 1-2 cm papillary UCC on 12/31/20. Pathology showed low grade, detached UCC. She has done well post-op with her only complaint being urinary urgency and frequency q 2 hours, which were present months prior to surgery. She denies interval UTIs, fever/chills or significant flank pain.   07/23/21: The patient is here today for a routine follow-up and to plan her next surveillance ureteroscopy. Doing well and reports no new major health issues. Urge incontinence has worsened--wearing 3-4 ppd. Frequency q 2-2.5 hours. Minimal SUI sxs. Denies interval UTIs, dysuria or hematuria.   09/02/2021: 77 year old female who presents today for preoperative appointment. She denies any changes to her voiding habits, shortness of breath, chest pain, changes to her medications. She has been having frequent nosebleeds and does endorse that they can be quite severe at times. She has not seen an ENT for this. She reports her blood pressures have been at her normal.. She has no questions today regarding her upcoming ureteroscopy. She denies interval UTIs, dysuria, hematuria.   12/17/21: The patient is here today for a routine follow-up. She denies interval episodes of hematuria or dysuria. She does report some urinary hesitancy, but is not bothered by it. Her daughter is currently being treated for refractory metastatic ovarian cancer and this is weighing heavily on her.   02/03/2022: The patient is here today for cystoscopy and  right ureteral stent removal following fulguration of her right renal pelvis and  ureteral tumor burden. She has done well since surgery denies fever/chills or general malaise. I spoke with the GU tumor board who recommended continued local therapy and deferred nephroureterectomy, especially given her poor renal function.   04/29/22: The patient is here today after completing a 6-week course of right renal pelvis Jelmyto infusions, which she tolerated well. She reports stable, nonbothersome right flank discomfort around her tube insertion site. She denies interval episodes of dysuria or hematuria.   09/06/22: The patient is here today for a routine follow-up. She is recovering from a bout of laryngitis, but states that she is improving. She is doing well and denies interval episodes of flank pain.   10/15/2022: The patient is here today for a routine follow-up and stent removal following her diagnostic ureteroscopy. She was found to have 2 suspicious areas within the ureter that ultimately resulted as dysplasia following biopsy. She has struggled with her stent over the last 2 weeks and notes intermittent episodes of fairly severe right-sided flank pain and urinary urgency. She denies gross hematuria, nausea/vomiting or fever/chills.   12/27/2022: The patient presents back today after she noticed "dust" in her urine and has had low back pain and symptoms of UTI for the past week. UA at her PCPs office showed no concerning features as is her UA today. She denies any gross hematuria or isolated flank pain. She also denies nausea/vomiting or fever/chills. Ureteroscopy back in February showed no evidence of UCC recurrence.   04/14/2023: The patient is here today for routine follow-up. She has done reasonably well over the past several months. She continues to have intermittent, sporadic episodes of right-sided flank pain with no specific aggravating or alleviating factors. She denies fever/chills, dysuria or hematuria. No new major health issues.     ALLERGIES: Cephalexin IVP Dye - Trouble  Breathing, Other Reaction, Tightness in throat Shellfish    MEDICATIONS: Oxybutynin Chloride 5 mg tablet 1 tablet PO Q 8 H PRN Take as needed for bladder spasms/pain  Simvastatin 10 mg tablet  Estradiol-Norethindrone Acetat 1 mg-0.5 mg tablet  Ezetimibe 10 mg tablet  Farxiga 10 mg tablet  Levothyroxine Sodium 137 mcg capsule  Losartan-Hydrochlorothiazide 100 mg-25 mg tablet tablet     GU PSH: Cysto Bladder Ureth Biopsy - 01/27/2022, 2023 Cysto Remove Stent FB Sim - 10/15/2022, 02/03/2022, 2022 Cysto Uretero Biopsy Fulgura - 10/01/2022, Right - 2021 Cysto Uretero W/excise Tumor, Right - 2022 Cystoscopy - 2021 Cystoscopy Insert Stent - 01/27/2022 Cystoscopy Ureteroscopy - 06/02/2022, 2023, 04/22/2021 Instll Rx Agnt Into Rnal Tub - 04/14/2022, 04/07/2022, 03/31/2022, 03/24/2022, 03/17/2022, 03/10/2022 Locm 300-399Mg /Ml Iodine,1Ml - 2021     NON-GU PSH: Hip Replacement, Right - 2017 Thyroid Surgery, thyroid lobe removal - 1979 Visit Complexity (formerly GPC1X) - 10/15/2022     GU PMH: Renal pelvis cancer, right - 12/27/2022, - 10/15/2022, - 09/06/2022, - 04/29/2022, - 04/14/2022, - 04/07/2022, - 03/24/2022, - 03/17/2022, - 03/10/2022, - 02/17/2022, - 02/03/2022, - 12/17/2021, - 2023, - 07/23/2021, - 2022, - 2022, - 2021, - 2021 Bladder Cancer Trigone - 04/14/2022, - 03/24/2022, - 03/17/2022, - 02/17/2022, - 02/03/2022 Right renal neoplasm - 03/31/2022, - 2022, - 2021 Urge incontinence - 07/23/2021 Microscopic hematuria - 2022 Nocturia (Stable) - 2022, - 2021 Urinary Urgency - 2022 Chronic kidney disease stage 3 (GFR 30-60) - 2022 Gross hematuria - 2021, (Stable), - 2021, - 2021 Hemorrhagic cystitis - 2021 Urinary Frequency - 2021    NON-GU PMH:  Pyuria/other UA findings - 12/27/2022 Arthritis GERD Hypercholesterolemia Hypertension Hypothyroidism    FAMILY HISTORY: 1 Daughter - Daughter 1 son - Son Breast Cancer - Mother Diabetes - Father father deceased - Father mother deceased - Mother    Notes: Mother  deceased at age 63 d/t breast cancer  Father deceased at age 33 d/t diabetes   SOCIAL HISTORY: Marital Status: Widowed Current Smoking Status: Patient smokes. Has smoked since 02/20/1990. Smokes 1/2 pack per day.   Tobacco Use Assessment Completed: Used Tobacco in last 30 days? Does not drink anymore.  Drinks 2 caffeinated drinks per day.    REVIEW OF SYSTEMS:    GU Review Female:   Patient denies have to strain to urinate, burning /pain with urination, leakage of urine, stream starts and stops, get up at night to urinate, trouble starting your stream, frequent urination, being pregnant, and hard to postpone urination.  Gastrointestinal (Upper):   Patient denies nausea, vomiting, and indigestion/ heartburn.  Gastrointestinal (Lower):   Patient denies diarrhea and constipation.  Constitutional:   Patient denies fever, night sweats, weight loss, and fatigue.  Skin:   Patient denies skin rash/ lesion and itching.  Eyes:   Patient denies blurred vision and double vision.  Ears/ Nose/ Throat:   Patient denies sore throat and sinus problems.  Hematologic/Lymphatic:   Patient denies swollen glands and easy bruising.  Cardiovascular:   Patient denies leg swelling and chest pains.  Respiratory:   Patient denies cough and shortness of breath.  Endocrine:   Patient denies excessive thirst.  Musculoskeletal:   Patient denies back pain and joint pain.  Neurological:   Patient denies headaches and dizziness.  Psychologic:   Patient denies depression and anxiety.   VITAL SIGNS: None   Complexity of Data:  Records Review:   Previous Hospital Records, Previous Patient Records   PROCEDURES:          Visit Complexity - G2211          Urinalysis w/Scope - 81001 Dipstick Dipstick Cont'd Micro  Color: Yellow Bilirubin: Neg WBC/hpf: 0 - 5/hpf  Appearance: Cloudy Ketones: Neg RBC/hpf: 0 - 2/hpf  Specific Gravity: 1.020 Blood: 2+ Bacteria: Rare (0-9/hpf)  pH: <=5.0 Protein: Neg Cystals: NS (Not  Seen)  Glucose: 3+ Urobilinogen: 0.2 Casts: NS (Not Seen)    Nitrites: Neg Trichomonas: Not Present    Leukocyte Esterase: Neg Mucous: Not Present      Epithelial Cells: 0 - 5/hpf      Yeast: NS (Not Seen)      Sperm: Not Present    Notes:      ASSESSMENT:      ICD-10 Details  1 GU:   Renal pelvis cancer, 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 Abdomen/Pelvis Without I.V. Contrast  Return Visit/Planned Activity: Next Available Appointment - Schedule Surgery          Document Letter(s):  Created for Patient: Clinical Summary         Notes:   -CT stone study pending to evaluate for the source of her ongoing flank pain  -Plan for surveillance ureteroscopy in the coming weeks

## 2023-04-27 NOTE — Op Note (Signed)
Operative Note  Preoperative diagnosis:  1.  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  Cystoscopy with right ureteroscopy and fulguration of right renal pelvis tumors  Surgeon: Rhoderick Moody, MD  Assistants:  None  Anesthesia:  General  Complications:  None  EBL: Less than 5 mL  Specimens: 1.  None  Drains/Catheters: 1.  Right 6 French, 24 cm JJ stent without tether  Intraoperative findings:   Solitary right collecting system with no filling defects or dilation involving the right ureter or right renal pelvis seen on retrograde pyelogram Ureteroscopic examination of the right renal pelvis revealed three 1 to 2 mm papillary tumors involving upper, mid and lower pole calyces.  These tumors were extensively fulgurated  Indication:  Maria Marquez is a 77 y.o. 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  She has been consented for the above procedures, voices understanding and wishes to proceed.  Description of procedure:  After informed consent was obtained, the patient was brought to the operating room and general LMA anesthesia was administered. The patient was then placed in the dorsolithotomy position and prepped and draped in the usual sterile fashion. A timeout was performed. A 23 French rigid cystoscope was then inserted into the urethral meatus and advanced into the bladder under direct vision. A complete bladder survey revealed no intravesical pathology.  A 5 French ureteral catheter was then inserted into the right ureteral orifice and a retrograde pyelogram was obtained, with the findings listed above.  A Glidewire was then used to intubate the lumen of the ureteral catheter and was advanced up to the right renal pelvis, under fluoroscopic guidance.  The catheter was then removed, leaving the wire in place.  A  flexible ureteroscope was then advanced up the right ureter and into the right renal pelvis.  A complete inspection of the right renal pelvis and all associated calyces revealed a total of three, 1 to 2 mm papillary bladder tumors in the upper, mid and lower pole calyces.  Due to the small nature of the tumors and obvious papillary tumor characteristics, I made the decision to fulgurate them with Bugbee electrocautery.  The flexible ureteroscope was then removed under direct vision, identifying no ureteral pathology.  A 6 French, 24 cm JJ stent was then placed over the wire and into good position within the right collecting system, confirming placement via fluoroscopy.  The patient's bladder was drained.  She tolerated the procedure well and was transferred to the postanesthesia in stable condition.  Plan: Follow-up in 1 week for office cystoscopy and stent removal

## 2023-04-27 NOTE — Discharge Instructions (Signed)
[facility] °[phone] °Post Ureteroscopy With or Without Stent Instructions ° °Definitions: ° °Ureter: The duct that transports urine from the kidney to the bladder. °Stent:   A plastic hollow tube that is placed into the ureter, from the kidney to the bladder to prevent the ureter from swelling shut. ° °GENERAL INSTRUCTIONS: ° °Despite the fact that no skin incisions were used, the area around the ureter and bladder is raw and irritated. The stent is a foreign body which will further irritate the bladder wall. This irritation is manifested by increased frequency of urination, both day and night, and by an increase in the urge to urinate. In some, the urge to urinate is present almost always. Sometimes the urge is strong enough that you may not be able to stop yourself from urinating. The only real cure is to remove the stent and then give time for the bladder wall to heal which can't be done until the danger of the ureter swelling shut has passed, which varies. ° °You may see some blood in your urine while the stent is in place and a few days afterwards. Do not be alarmed, even if the urine was clear for a while. Get off your feet and drink lots of fluids until clearing occurs. If you start to pass clots or don't improve, call us. ° °DIET: °You may return to your normal diet immediately. Because of the raw surface of your bladder, alcohol, spicy foods, acid type foods and drinks with caffeine may cause irritation or frequency and should be used in moderation. To keep your urine flowing freely and to avoid constipation, drink plenty of fluids during the day ( 8-10 glasses ). °Tip: Avoid cranberry juice because it is very acidic. ° °ACTIVITY: °Your physical activity doesn't need to be restricted. However, if you are very active, you may see some blood in your urine. We suggest that you reduce your activity under these circumstances until the bleeding has stopped. ° °BOWELS: °It is important to  keep your bowels regular during the postoperative period. Straining with bowel movements can cause bleeding. A bowel movement every other day is reasonable. Use a mild laxative if needed, such as Milk of Magnesia 2-3 tablespoons, or 2 Dulcolax tablets. Call if you continue to have problems. If you have been taking narcotics for pain, before, during or after your surgery, you may be constipated. Take a laxative if necessary. ° ° °MEDICATION: °You should resume your pre-surgery medications unless told not to. In addition you will often be given an antibiotic to prevent infection. These should be taken as prescribed until the bottles are finished unless you are having an unusual reaction to one of the drugs. ° °PROBLEMS YOU SHOULD REPORT TO US: °Fevers over 100.5 Fahrenheit. °Heavy bleeding, or clots ( See above notes about blood in urine ). °Inability to urinate. °Drug reactions ( hives, rash, nausea, vomiting, diarrhea ). °Severe burning or pain with urination that is not improving. ° °FOLLOW-UP: °You will need a follow-up appointment to monitor your progress. Call for this appointment at the number listed above. Usually the first appointment will be about three to fourteen days after your surgery. ° ° ° °  ° ° ° °Post Anesthesia Home Care Instructions ° °Activity: °Get plenty of rest for the remainder of the day. A responsible individual must stay with you for 24 hours following the procedure.  °For the next 24 hours, DO NOT: °-Drive a car °-Operate machinery °-Drink alcoholic beverages °-Take any medication   unless instructed by your physician °-Make any legal decisions or sign important papers. ° °Meals: °Start with liquid foods such as gelatin or soup. Progress to regular foods as tolerated. Avoid greasy, spicy, heavy foods. If nausea and/or vomiting occur, drink only clear liquids until the nausea and/or vomiting subsides. Call your physician if vomiting continues. ° °Special Instructions/Symptoms: °Your throat  may feel dry or sore from the anesthesia or the breathing tube placed in your throat during surgery. If this causes discomfort, gargle with warm salt water. The discomfort should disappear within 24 hours. ° °

## 2023-04-27 NOTE — Transfer of Care (Signed)
Immediate Anesthesia Transfer of Care Note  Patient: Maria Marquez  Procedure(s) Performed: Procedure(s) (LRB): CYSTOSCOPY, WITH RIGHT RETROGRADE PYELOGRAM, RIGHT URETEROSCOPY, POSSIBLE BIOPSY, RIGHT URETERAL STENT PLACEMENT, FULGURATION OF RIGHT RENAL PELVIS (Right)  Patient Location: PACU  Anesthesia Type: General  Level of Consciousness: awake, oriented, sedated and patient cooperative  Airway & Oxygen Therapy: Patient Spontanous Breathing and Patient connected to face mask oxygen  Post-op Assessment: Report given to PACU RN and Post -op Vital signs reviewed and stable  Post vital signs: Reviewed and stable  Complications: No apparent anesthesia complications Last Vitals:  Vitals Value Taken Time  BP 150/65 04/27/23 1025  Temp 36.7 C 04/27/23 1025  Pulse 72 04/27/23 1029  Resp 13 04/27/23 1029  SpO2 97 % 04/27/23 1029  Vitals shown include unfiled device data.  Last Pain:  Vitals:   04/27/23 0805  TempSrc: Oral         Complications: No notable events documented.

## 2023-04-27 NOTE — Anesthesia Procedure Notes (Signed)
Procedure Name: LMA Insertion Date/Time: 04/27/2023 9:42 AM  Performed by: Bishop Limbo, CRNAPre-anesthesia Checklist: Patient identified, Emergency Drugs available, Suction available and Patient being monitored Patient Re-evaluated:Patient Re-evaluated prior to induction Oxygen Delivery Method: Circle system utilized Preoxygenation: Pre-oxygenation with 100% oxygen Induction Type: IV induction Ventilation: Mask ventilation without difficulty LMA: LMA inserted LMA Size: 4.0 Number of attempts: 1 Placement Confirmation: positive ETCO2 Tube secured with: Tape Dental Injury: Teeth and Oropharynx as per pre-operative assessment

## 2023-04-27 NOTE — Anesthesia Preprocedure Evaluation (Signed)
Anesthesia Evaluation  Patient identified by MRN, date of birth, ID band Patient awake    Reviewed: Allergy & Precautions, H&P , NPO status , Patient's Chart, lab work & pertinent test results  History of Anesthesia Complications (+) PONV and history of anesthetic complications  Airway Mallampati: II   Neck ROM: full    Dental   Pulmonary shortness of breath, former smoker   breath sounds clear to auscultation       Cardiovascular hypertension,  Rhythm:regular Rate:Normal     Neuro/Psych  Headaches PSYCHIATRIC DISORDERS  Depression       GI/Hepatic ,GERD  ,,  Endo/Other  diabetes, Type 2Hypothyroidism    Renal/GU Renal diseasestones     Musculoskeletal  (+) Arthritis ,    Abdominal   Peds  Hematology   Anesthesia Other Findings   Reproductive/Obstetrics                             Anesthesia Physical Anesthesia Plan  ASA: 3  Anesthesia Plan: General   Post-op Pain Management:    Induction: Intravenous  PONV Risk Score and Plan: 4 or greater and Ondansetron, Dexamethasone, Treatment may vary due to age or medical condition and Midazolam  Airway Management Planned: LMA  Additional Equipment:   Intra-op Plan:   Post-operative Plan: Extubation in OR  Informed Consent: I have reviewed the patients History and Physical, chart, labs and discussed the procedure including the risks, benefits and alternatives for the proposed anesthesia with the patient or authorized representative who has indicated his/her understanding and acceptance.     Dental advisory given  Plan Discussed with: CRNA, Anesthesiologist and Surgeon  Anesthesia Plan Comments:        Anesthesia Quick Evaluation

## 2023-04-29 ENCOUNTER — Encounter (HOSPITAL_BASED_OUTPATIENT_CLINIC_OR_DEPARTMENT_OTHER): Payer: Self-pay | Admitting: Urology

## 2023-05-04 ENCOUNTER — Encounter: Payer: Self-pay | Admitting: Internal Medicine

## 2023-05-04 DIAGNOSIS — C651 Malignant neoplasm of right renal pelvis: Secondary | ICD-10-CM | POA: Diagnosis not present

## 2023-05-04 DIAGNOSIS — N1832 Chronic kidney disease, stage 3b: Secondary | ICD-10-CM | POA: Diagnosis not present

## 2023-05-04 DIAGNOSIS — R3121 Asymptomatic microscopic hematuria: Secondary | ICD-10-CM | POA: Diagnosis not present

## 2023-05-09 ENCOUNTER — Encounter: Payer: Self-pay | Admitting: Podiatry

## 2023-05-09 ENCOUNTER — Ambulatory Visit (INDEPENDENT_AMBULATORY_CARE_PROVIDER_SITE_OTHER): Payer: Medicare HMO | Admitting: Podiatry

## 2023-05-09 DIAGNOSIS — E1151 Type 2 diabetes mellitus with diabetic peripheral angiopathy without gangrene: Secondary | ICD-10-CM | POA: Diagnosis not present

## 2023-05-09 DIAGNOSIS — M79675 Pain in left toe(s): Secondary | ICD-10-CM | POA: Diagnosis not present

## 2023-05-09 DIAGNOSIS — B351 Tinea unguium: Secondary | ICD-10-CM

## 2023-05-09 DIAGNOSIS — M79674 Pain in right toe(s): Secondary | ICD-10-CM | POA: Diagnosis not present

## 2023-05-09 DIAGNOSIS — E1142 Type 2 diabetes mellitus with diabetic polyneuropathy: Secondary | ICD-10-CM

## 2023-05-09 DIAGNOSIS — L84 Corns and callosities: Secondary | ICD-10-CM

## 2023-05-09 NOTE — Progress Notes (Unsigned)
Subjective:  Patient ID: Maria Marquez, female    DOB: 1946-03-14,  MRN: [id number]  Maria Marquez presents to clinic today for:  Chief Complaint  Patient presents with   Callouses   routine foot care    Concern of right foot callus, left foot nail changes   Patient notes nails are thick and elongated, causing pain in shoe gear when ambulating.  She has a painful right submet 5 callus.    PCP is Philip Aspen, Limmie Patricia, MD.  Last seen within the past month.  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 Valentino Nose;  due to chemotherapy   Chronic rhinitis    w/ upper airway wheezing due to nasal drainage per pulmonogy note (dr Celine Mans),  using stiolto inhaler   Diabetes mellitus type 2, diet-controlled (HCC)    followed by pcp;    (04-20-2023 pt has started take farxiga daily;    per pt check blood sugar multiple times daily w/ Libre,  fasting average 120--125   Dyspnea on exertion    04-20-2023 pt stated sob w/ long distancewalk and stairs but recovers quickly when stop/ rest,  ok with household chores and short walks   Family history of breast cancer 05/04/2021   Family history of ovarian cancer 05/04/2021   Generalized weakness    GERD (gastroesophageal reflux disease)    History of basal cell carcinoma (BCC) excision    per pt in 1970s on nose   History of chemotherapy    06-13-2020  to 12/ 2021  for right renal pelvis carcinoma   History of colon polyps    History of radiation therapy    11-13-2020 to 11-26-2020---   Right Lung- SBRT- Dr. Antony Blackbird   HLD (hyperlipidemia)     Hypertension    followed by pcp   Hypothyroidism, postsurgical 1979   followed by pcp   Leukocytosis    Nocturia    OA (osteoarthritis)    PONV (postoperative nausea and vomiting)    Since chemo, ponv   Port-A-Cath in place    Primary adenocarcinoma of upper lobe of right lung (HCC) 09/2020   oncologist--- dr dorsey/  pulmonology-- dr n. Celine Mans;  dx 02/ 2022;  s/p  SBRT 11-13-2020 to 11-26-2020   Urgency of urination    wears pads   Varicose veins of both legs with edema    Wears partial dentures    lower    Allergies  Allergen Reactions   Iodine Shortness Of Breath and Other (See Comments)   Keflex [Cephalexin] Shortness Of Breath, Rash and Tinitus   Shellfish Allergy Hives   Shellfish-Derived Products Anaphylaxis and Hives   Hydrocodone Hives and Itching    Itching throat   Lasix [Furosemide] Itching   Rosuvastatin Other (See Comments)    Muscle Pain in Thighs   Ciprofloxacin Other (See Comments)   Latex Rash   Lisinopril Cough   Sulfa Antibiotics Nausea Only   Tramadol Itching    Objective:  There were no vitals filed for this visit.  Maria Sine  Marquez is a pleasant 77 y.o. female in NAD. AAO x 3.  Vascular Examination: Patient has palpable DP pulse, absent PT pulse bilateral.  Delayed capillary refill bilateral toes.  Sparse digital hair bilateral.  Proximal to distal cooling WNL bilateral.    Dermatological Examination: Interspaces are clear with no open lesions noted bilateral.  Skin is shiny and atrophic bilateral.  Nails are 3-57mm thick, with yellowish/brown discoloration, subungual debris and distal onycholysis x10.  There is pain with compression of nails x10.  There are hyperkeratotic lesions noted submet 5 right foot.     Latest Ref Rng & Units 10/07/2022    1:17 PM 07/06/2022   11:42 AM  Hemoglobin A1C  Hemoglobin-A1c 4.0 - 5.6 % 6.7  6.3    Patient qualifies for at-risk foot care because of Diabetes with PVD, pain in nails (Q8) .  Assessment/Plan: 1.  Type 2 diabetes mellitus with diabetic polyneuropathy, without long-term current use of insulin (HCC)   2. Pre-ulcerative calluses   3. Pain due to onychomycosis of toenails of both feet     FOR HOME USE ONLY DME DIABETIC SHOE  Mycotic nails x10 were sharply debrided with sterile nail nippers and power debriding burr to decrease bulk and length.  Recommended OTC Funginail solution for fungal toenail treatment.    Hyperkeratotic lesions were shaved with #312 blade.  Recommended diabetic shoes with custom diabetic insoles to off-load the 5th met head where she gets her pre-ulcerative callus.  Return in about 3 months (around 08/08/2023) for [facility].   Clerance Lav, DPM, FACFAS [facility]     [address] [place], [place] [address]                Office [phone]  Fax [phone]

## 2023-05-15 ENCOUNTER — Other Ambulatory Visit: Payer: Self-pay | Admitting: Internal Medicine

## 2023-05-15 DIAGNOSIS — Z79890 Hormone replacement therapy (postmenopausal): Secondary | ICD-10-CM

## 2023-05-16 ENCOUNTER — Encounter: Payer: Self-pay | Admitting: Internal Medicine

## 2023-05-16 ENCOUNTER — Encounter: Payer: Self-pay | Admitting: Hematology and Oncology

## 2023-05-18 ENCOUNTER — Encounter: Payer: Self-pay | Admitting: Internal Medicine

## 2023-05-20 ENCOUNTER — Inpatient Hospital Stay: Payer: Medicare HMO | Attending: Oncology | Admitting: Licensed Clinical Social Worker

## 2023-05-20 DIAGNOSIS — C659 Malignant neoplasm of unspecified renal pelvis: Secondary | ICD-10-CM

## 2023-05-20 NOTE — Progress Notes (Signed)
[facility] CSW Progress Note  Clinical Child psychotherapist contacted patient by phone to discuss request for kidney cancer support groups.  CSW informed pt there is not a kidney cancer specific support group at [facility], but the [employer] offers virtual groups and peer mentors.  CSW also discussed the Living Well with Advanced Cancer retreat     Rachel Moulds, LCSW Clinical Social Worker [facility]

## 2023-05-24 ENCOUNTER — Ambulatory Visit (HOSPITAL_COMMUNITY): Payer: Medicare HMO

## 2023-05-24 ENCOUNTER — Other Ambulatory Visit: Payer: Medicare HMO

## 2023-05-24 DIAGNOSIS — Z20822 Contact with and (suspected) exposure to covid-19: Secondary | ICD-10-CM | POA: Diagnosis not present

## 2023-05-24 DIAGNOSIS — J209 Acute bronchitis, unspecified: Secondary | ICD-10-CM | POA: Diagnosis not present

## 2023-05-24 DIAGNOSIS — R059 Cough, unspecified: Secondary | ICD-10-CM | POA: Diagnosis not present

## 2023-05-26 ENCOUNTER — Other Ambulatory Visit: Payer: Self-pay | Admitting: Internal Medicine

## 2023-05-26 DIAGNOSIS — C3411 Malignant neoplasm of upper lobe, right bronchus or lung: Secondary | ICD-10-CM

## 2023-05-30 ENCOUNTER — Ambulatory Visit (HOSPITAL_COMMUNITY): Payer: Medicare HMO

## 2023-06-02 ENCOUNTER — Encounter: Payer: Medicare HMO | Admitting: Family Medicine

## 2023-06-02 DIAGNOSIS — N898 Other specified noninflammatory disorders of vagina: Secondary | ICD-10-CM | POA: Diagnosis not present

## 2023-06-02 DIAGNOSIS — R300 Dysuria: Secondary | ICD-10-CM | POA: Diagnosis not present

## 2023-06-02 DIAGNOSIS — R319 Hematuria, unspecified: Secondary | ICD-10-CM | POA: Diagnosis not present

## 2023-06-03 ENCOUNTER — Ambulatory Visit (HOSPITAL_COMMUNITY)
Admission: RE | Admit: 2023-06-03 | Discharge: 2023-06-03 | Disposition: A | Discharge status: Home / Self Care | Payer: Medicare HMO | Source: Ambulatory Visit | Attending: Hematology and Oncology | Admitting: Hematology and Oncology

## 2023-06-03 ENCOUNTER — Ambulatory Visit: Payer: Medicare HMO

## 2023-06-03 DIAGNOSIS — C3490 Malignant neoplasm of unspecified part of unspecified bronchus or lung: Secondary | ICD-10-CM | POA: Insufficient documentation

## 2023-06-03 DIAGNOSIS — L84 Corns and callosities: Secondary | ICD-10-CM

## 2023-06-03 DIAGNOSIS — C679 Malignant neoplasm of bladder, unspecified: Secondary | ICD-10-CM | POA: Diagnosis not present

## 2023-06-03 DIAGNOSIS — R918 Other nonspecific abnormal finding of lung field: Secondary | ICD-10-CM | POA: Diagnosis not present

## 2023-06-03 DIAGNOSIS — E1151 Type 2 diabetes mellitus with diabetic peripheral angiopathy without gangrene: Secondary | ICD-10-CM

## 2023-06-03 DIAGNOSIS — B351 Tinea unguium: Secondary | ICD-10-CM

## 2023-06-03 NOTE — Progress Notes (Signed)
Patient presents to the office today for diabetic shoe and insole measuring.  Patient was measured with brannock device to determine size and width for 1 pair of extra depth shoes and foam casted for 3 pair of insoles.   Documentation of medical necessity will be sent to patient's treating diabetic doctor to verify and sign.   Patient's diabetic provider: Peggye Pitt MD   Shoes and insoles will be ordered at that time and patient will be notified for an appointment for fitting when they arrive.   Shoe size (per patient): 10-11 Brannock measurement: 10 Patient shoe selection- Shoe choice:   X529W / M840B new balance  Shoe size ordered: 11WD  Financials signed order placed

## 2023-06-10 ENCOUNTER — Telehealth: Payer: Self-pay

## 2023-06-10 NOTE — Telephone Encounter (Signed)
Pt LVM to check on the status of her CT scan. This RN called pt back to let her know that the CT scan has not yet been read by the radiologist so we currently do not have results to report back to her yet. Pt verbalized understanding.

## 2023-06-15 ENCOUNTER — Telehealth: Payer: Self-pay | Admitting: *Deleted

## 2023-06-15 NOTE — Telephone Encounter (Signed)
TCT patient regarding recent CT scan. Spopke with her. Advised that her CT scan showed no evidence of residual or recurrent lung cancer. At this time she remains in complete remission. Will plan to see her back as scheduled on 10/19/2023. Pt very pleased with results and appreciative of the call. She is aware of her appt in February 2025

## 2023-06-15 NOTE — Telephone Encounter (Signed)
-----   Message from Ulysees Barns IV sent at 06/15/2023  2:01 PM EDT ----- Please let Maria Marquez know that her CT scan showed no evidence of residual or recurrent lung cancer.  At this time she remains in complete remission.  Will plan to see her back as scheduled on 10/19/2023. ----- Message ----- From: Interface, Rad Results In Sent: 06/15/2023   1:32 PM EDT To: Jaci Standard, MD

## 2023-06-20 ENCOUNTER — Encounter: Payer: Self-pay | Admitting: Internal Medicine

## 2023-06-21 ENCOUNTER — Ambulatory Visit: Payer: Medicare HMO | Admitting: Internal Medicine

## 2023-06-23 ENCOUNTER — Ambulatory Visit: Payer: Medicare HMO | Admitting: Internal Medicine

## 2023-06-23 DIAGNOSIS — M25561 Pain in right knee: Secondary | ICD-10-CM | POA: Diagnosis not present

## 2023-06-23 DIAGNOSIS — M25562 Pain in left knee: Secondary | ICD-10-CM | POA: Diagnosis not present

## 2023-07-01 ENCOUNTER — Other Ambulatory Visit: Payer: Self-pay | Admitting: Internal Medicine

## 2023-07-06 ENCOUNTER — Ambulatory Visit (INDEPENDENT_AMBULATORY_CARE_PROVIDER_SITE_OTHER): Payer: Medicare HMO | Admitting: Internal Medicine

## 2023-07-06 ENCOUNTER — Encounter: Payer: Self-pay | Admitting: Internal Medicine

## 2023-07-06 VITALS — BP 138/80 | HR 77 | Ht 65.0 in | Wt 278.8 lb

## 2023-07-06 DIAGNOSIS — Z87891 Personal history of nicotine dependence: Secondary | ICD-10-CM | POA: Diagnosis not present

## 2023-07-06 DIAGNOSIS — R0602 Shortness of breath: Secondary | ICD-10-CM

## 2023-07-06 MED ORDER — STIOLTO RESPIMAT 2.5-2.5 MCG/ACT IN AERS: 2.0000 | INHALATION_SPRAY | Freq: Every day | RESPIRATORY_TRACT | 11 refills | Status: DC

## 2023-07-06 NOTE — Progress Notes (Signed)
Maria Marquez    [id number]    Apr 24, 1946  Primary Care Physician:Hernandez Priscella Mann, MD Date of Appointment: 07/06/2023 Established Patient Visit  Chief complaint:   Chief Complaint  Patient presents with   Follow-up    F/U visit    HPI: Maria Marquez is a 78 y.o. woman with mild emphysema, Stage 1a lung cancer s/p resection, LPR, possible OSA.   Interval Updates: Here for follow up. She is back on stiolto. Albuterol use is minimal. She thinks it is making a big difference.   She is also doing well on flonase to help with chronic rhinitis and has improvement.   She wants to hold off on sleep study due to having a lot going on - having knee problems and getting injections. 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 Valentino Nose;  due to chemotherapy   Chronic rhinitis    w/ upper airway wheezing due to nasal drainage per pulmonogy note (dr Celine Mans),  using stiolto inhaler   Diabetes mellitus type 2, diet-controlled (HCC)    followed by pcp;    (04-20-2023 pt has started take farxiga daily;    per pt check blood sugar multiple times daily w/ Libre,  fasting average 120--125   Dyspnea on exertion    04-20-2023 pt stated sob w/ long distancewalk and stairs but recovers quickly when stop/ rest,  ok with household chores and short walks   Family history of breast cancer 05/04/2021   Family history of ovarian cancer 05/04/2021   Generalized weakness    GERD (gastroesophageal reflux disease)     History of basal cell carcinoma (BCC) excision    per pt in 1970s on nose   History of chemotherapy    06-13-2020  to 12/ 2021  for right renal pelvis carcinoma   History of colon polyps    History of radiation therapy    11-13-2020 to 11-26-2020---   Right Lung- SBRT- Dr. Antony Blackbird   HLD (hyperlipidemia)    Hypertension    followed by pcp   Hypothyroidism, postsurgical 1979   followed by pcp   Leukocytosis    Nocturia    OA (osteoarthritis)    PONV (postoperative nausea and vomiting)    Since chemo, ponv   Port-A-Cath in place    Primary adenocarcinoma of upper lobe of right lung [facility]) 09/2020   oncologist--- dr dorsey/  pulmonology-- dr n. Celine Mans;  dx 02/ 2022;  s/p  SBRT 11-13-2020 to 11-26-2020   Urgency of urination    wears pads   Varicose veins of both legs with edema    Wears partial dentures    lower    Past Surgical History:  Procedure Laterality Date   COLONOSCOPY  last one 2017   CYSTOSCOPY W/ URETERAL STENT PLACEMENT Right 01/27/2022   Procedure: CYSTOSCOPY WITH RETROGRADE PYELOGRAM/ URETEROSCOPY/POSSIBLE BIOPSY/ POSSIBLE  URETERAL STENT PLACEMENT;  Surgeon: Rene Paci, MD;  Location: [facility];  Service: Urology;  Laterality: Right;   CYSTOSCOPY WITH BIOPSY Right 09/16/2021   Procedure: CYSTOSCOPY / URETEROSCOPY WITH BIOPSY/bugbee fulgeration/right retrograde;  Surgeon: Rene Paci, MD;  Location: [facility];  Service: Urology;  Laterality: Right;   CYSTOSCOPY WITH RETROGRADE PYELOGRAM, URETEROSCOPY AND STENT PLACEMENT Right 04/27/2023   Procedure: CYSTOSCOPY, WITH RIGHT RETROGRADE PYELOGRAM, RIGHT URETEROSCOPY, RIGHT URETERAL STENT PLACEMENT, FULGURATION OF RIGHT RENAL PELVIS TUMORS;  Surgeon: Rene Paci, MD;  Location: [facility];  Service: Urology;  Laterality: Right;   CYSTOSCOPY WITH STENT PLACEMENT Right 10/01/2022   Procedure: CYSTOSCOPY WITH STENT PLACEMENT;  Surgeon:  Rene Paci, MD;  Location: [facility];  Service: Urology;  Laterality: Right;   CYSTOSCOPY WITH URETEROSCOPY Right 04/18/2020   Procedure: CYSTOSCOPY WITH URETEROSCOPY, BIOPSY;  Surgeon: Rene Paci, MD;  Location: [facility];  Service: Urology;  Laterality: Right;  ONLY NEEDS 45 MIN   CYSTOSCOPY WITH URETEROSCOPY Right 06/02/2022   Procedure: CYSTOSCOPY WITH URETEROSCOPY;  Surgeon: Rene Paci, MD;  [facility];  Service: Urology;  Laterality: Right;  ONLY NEEDS 60 MINS   CYSTOSCOPY WITH URETEROSCOPY AND STENT PLACEMENT N/A 12/31/2020   Procedure: CYSTOSCOPY WITH RIGHT URETEROSCOPY/ BILATERAL RETROGRADE PYELOGRAM/RIGHT URETERAL BIOPSY/ LASER ABLATION/RIGHT STENT PLACEMENT;  Surgeon: Rene Paci, MD;  Location: [facility];  Service: Urology;  Laterality: N/A;   CYSTOSCOPY/RETROGRADE/URETEROSCOPY Right 04/22/2021   Procedure: CYSTOSCOPY/RETROGRADE/URETEROSCOPY/ STENT PLACEMENT;  Surgeon: Rene Paci, MD;  Location: [facility];  Service: Urology;  Laterality: Right;   EYE SURGERY Bilateral 1987   tear ducts   IR IMAGING GUIDED PORT INSERTION  06/04/2020   IR NEPHROSTOMY PLACEMENT RIGHT  03/03/2022   THYROID LOBECTOMY Right 1979   TOTAL HIP ARTHROPLASTY Right 01/28/2016   Procedure: RIGHT TOTAL HIP ARTHROPLASTY ANTERIOR APPROACH;  Surgeon: Ollen Gross, MD;  Location: [facility];  Service: Orthopedics;  Laterality: Right;   UMBILICAL HERNIA REPAIR  1980s   URETEROSCOPY Right 10/01/2022   Procedure: URETEROSCOPY WITH BIOPSY pylegram;  Surgeon: Rene Paci, MD;  Location[facility];  Service: Urology;  Laterality: Right;  45 MINS   VIDEO BRONCHOSCOPY WITH ENDOBRONCHIAL NAVIGATION N/A 06/06/2020   Procedure: VIDEO BRONCHOSCOPY WITH ENDOBRONCHIAL NAVIGATION;  Surgeon: Leslye Peer, MD;  Location: [facility];  Service: Thoracic;  Laterality: N/A;    VIDEO BRONCHOSCOPY WITH ENDOBRONCHIAL NAVIGATION N/A 10/10/2020   Procedure: VIDEO BRONCHOSCOPY WITH ENDOBRONCHIAL NAVIGATION;  Surgeon: Loreli Slot, MD;  Location: [facility];  Service: Thoracic;  Laterality: N/A;    Family History  Problem Relation Age of Onset   Arthritis Mother    Breast cancer Mother 22   Schizophrenia Mother    Diabetes Father    Heart disease Father    Kidney disease Father    CAD Father    Stroke Sister    Lung cancer Brother        dx > 50; work-related exposures   Heart disease Brother    Heart disease Brother    Vision loss Brother        Glaucoma   Alcohol abuse Brother    Ovarian cancer Daughter 70   Lung cancer Maternal Aunt        dx > 50   Colon cancer Paternal Aunt        dx > 50   Head & neck cancer Paternal Uncle  dx 50s   Leukemia Cousin        d. 15s   Breast cancer Niece 45    Social History   Occupational History   Occupation: Retired  Tobacco Use   Smoking status: Former    Current packs/day: 0.00    Average packs/day: 1 pack/day for 45.0 years (45.0 ttl pk-yrs)    Types: Cigarettes    Start date: 04/19/1975    Quit date: 04/18/2020    Years since quitting: 3.2   Smokeless tobacco: Never  Vaping Use   Vaping status: Never Used  Substance and Sexual Activity   Alcohol use: Not Currently    Comment: rare   Drug use: No   Sexual activity: Not on file     Physical Exam: Blood pressure 138/80, pulse 77, height 5\' 5"  (1.651 m), weight 278 lb 12.8 oz (126.5 kg), SpO2 97%.  Gen:      No acute distress, fatigued ENT:  mallampati IV, no nasal polyps, mucus membranes moist Lungs:   ctab no wheezes or crackles CV:        RRR no mrg   Data Reviewed: Imaging: I have personally reviewed the CT Chest April 2024 - stable RUL post radiation changes. Stable 5mm nodule and new 3mm RUL nodule.   PFTs:     Latest Ref Rng & Units 10/09/2020   11:45 AM  PFT Results  FVC-Pre L 3.00   FVC-Predicted Pre % 94   FVC-Post  L 3.00   FVC-Predicted Post % 94   Pre FEV1/FVC % % 77   Post FEV1/FCV % % 79   FEV1-Pre L 2.32   FEV1-Predicted Pre % 96   FEV1-Post L 2.36   DLCO uncorrected ml/min/mmHg 14.75   DLCO UNC% % 71   DLCO corrected ml/min/mmHg 15.94   DLCO COR %Predicted % 77   DLVA Predicted % 83   TLC L 5.79   TLC % Predicted % 108   RV % Predicted % 111    I have personally reviewed the patient's PFTs and normal pulmonary function  Labs: Lab Results  Component Value Date   WBC 8.1 04/18/2023   HGB 14.3 04/27/2023   HCT 42.0 04/27/2023   MCV 90.0 04/18/2023   PLT 218 04/18/2023   Lab Results  Component Value Date   NA 139 04/27/2023   K 3.2 (L) 04/27/2023   CL 101 04/27/2023   CO2 27 04/18/2023     Immunization status: Immunization History  Administered Date(s) Administered   Fluad Quad(high Dose 65+) 04/28/2016, 06/14/2019, 07/11/2020, 05/03/2022, 05/04/2023   Influenza, High Dose Seasonal PF 04/28/2016, 07/19/2017, 05/19/2018, 05/15/2021   Influenza, Seasonal, Injecte, Preservative Fre 04/25/2015   Influenza-Unspecified 04/25/2015, 04/28/2016, 07/12/2017, 05/25/2021, 05/03/2022   Janssen (J&J) SARS-COV-2 Vaccination 04/22/2020   Moderna Sars-Covid-2 Vaccination 05/15/2021   PFIZER(Purple Top)SARS-COV-2 Vaccination 07/03/2020, 01/13/2021   Pneumococcal Conjugate-13 06/13/2015   Pneumococcal Polysaccharide-23 06/10/2016   Pneumococcal-Unspecified 08/27/2019   Rsv, Bivalent, Protein Subunit Rsvpref,pf Verdis Frederickson) 05/18/2023   Tdap 06/16/2014   Zoster Recombinant(Shingrix) 01/08/2020, 05/13/2020   Zoster, Live 05/27/2014    External Records Personally Reviewed: oncology  Assessment:  Mild COPD History of tobacco use disorder Radiographic emphysema, mild centrilobular Stage 1a lung cancer of the RUL 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 Continue the stiolto inhaler 2 puffs daily.  Continue the albuterol inhaler as  needed. Good luck with your knees! Let me know when you're ready for a sleep study.  Return to Care: Return in about 1 year (around 07/05/2024).   Durel Salts, MD Pulmonary and Critical Care Medicine [facility] Office:[phone]

## 2023-07-06 NOTE — Patient Instructions (Addendum)
It was a pleasure to see you today!  Please schedule follow up scheduled with myself in 1 year.  If my schedule is not open yet, we will contact you with a reminder closer to that time. Please call [phone] if you haven't heard from [name] a month before, and always call us sooner if issues or concerns arise. You can also send [name] a message through MyChart, but but aware that this is not to be used for urgent issues and it may take up to 5-7 days to receive a reply. Please be aware that you will likely be able to view your results before I have a chance to respond to them. Please give [name] 5 business days to respond to any non-urgent results.   Glad you are feeling well. Continue the stiolto inhaler 2 puffs daily.  Continue the albuterol inhaler as needed. Good luck with your knees! Let me know when you're ready for a sleep study.

## 2023-07-07 ENCOUNTER — Telehealth: Payer: Self-pay | Admitting: Internal Medicine

## 2023-07-07 NOTE — Telephone Encounter (Signed)
Pt calling in to have her stiolto sent into the [facility] pharmacy

## 2023-07-08 MED ORDER — STIOLTO RESPIMAT 2.5-2.5 MCG/ACT IN AERS: 2.0000 | INHALATION_SPRAY | Freq: Every day | RESPIRATORY_TRACT | 11 refills | Status: AC

## 2023-07-08 NOTE — Telephone Encounter (Signed)
Rx was sent to pharmacy for pt 11/13 but to mail order pharmacy. I have sent to [facility] at pt's request.

## 2023-07-11 ENCOUNTER — Encounter: Payer: Self-pay | Admitting: Internal Medicine

## 2023-07-11 NOTE — Telephone Encounter (Signed)
 Care team updated and letter sent for eye exam notes.

## 2023-07-14 DIAGNOSIS — E1122 Type 2 diabetes mellitus with diabetic chronic kidney disease: Secondary | ICD-10-CM | POA: Diagnosis not present

## 2023-07-14 DIAGNOSIS — C3411 Malignant neoplasm of upper lobe, right bronchus or lung: Secondary | ICD-10-CM | POA: Diagnosis not present

## 2023-07-14 DIAGNOSIS — I129 Hypertensive chronic kidney disease with stage 1 through stage 4 chronic kidney disease, or unspecified chronic kidney disease: Secondary | ICD-10-CM | POA: Diagnosis not present

## 2023-07-14 DIAGNOSIS — N1832 Chronic kidney disease, stage 3b: Secondary | ICD-10-CM | POA: Diagnosis not present

## 2023-07-14 DIAGNOSIS — D631 Anemia in chronic kidney disease: Secondary | ICD-10-CM | POA: Diagnosis not present

## 2023-07-14 DIAGNOSIS — C651 Malignant neoplasm of right renal pelvis: Secondary | ICD-10-CM | POA: Diagnosis not present

## 2023-07-14 DIAGNOSIS — E039 Hypothyroidism, unspecified: Secondary | ICD-10-CM | POA: Diagnosis not present

## 2023-07-16 LAB — LAB REPORT - SCANNED
Albumin, Urine POC: 10.3
Creatinine, POC: 81.4 mg/dL
EGFR: 30
Microalb Creat Ratio: 13

## 2023-07-19 DIAGNOSIS — M170 Bilateral primary osteoarthritis of knee: Secondary | ICD-10-CM | POA: Diagnosis not present

## 2023-07-27 DIAGNOSIS — N1832 Chronic kidney disease, stage 3b: Secondary | ICD-10-CM | POA: Diagnosis not present

## 2023-07-28 DIAGNOSIS — M170 Bilateral primary osteoarthritis of knee: Secondary | ICD-10-CM | POA: Diagnosis not present

## 2023-08-01 ENCOUNTER — Other Ambulatory Visit: Payer: Self-pay | Admitting: Internal Medicine

## 2023-08-01 DIAGNOSIS — Z79890 Hormone replacement therapy (postmenopausal): Secondary | ICD-10-CM

## 2023-08-02 ENCOUNTER — Ambulatory Visit: Payer: Medicare HMO | Admitting: Dietician

## 2023-08-04 DIAGNOSIS — M170 Bilateral primary osteoarthritis of knee: Secondary | ICD-10-CM | POA: Diagnosis not present

## 2023-08-05 ENCOUNTER — Inpatient Hospital Stay: Payer: Medicare HMO | Attending: Oncology

## 2023-08-05 DIAGNOSIS — Z85118 Personal history of other malignant neoplasm of bronchus and lung: Secondary | ICD-10-CM | POA: Diagnosis not present

## 2023-08-05 DIAGNOSIS — Z8553 Personal history of malignant neoplasm of renal pelvis: Secondary | ICD-10-CM | POA: Insufficient documentation

## 2023-08-05 DIAGNOSIS — Z95828 Presence of other vascular implants and grafts: Secondary | ICD-10-CM

## 2023-08-05 DIAGNOSIS — Z452 Encounter for adjustment and management of vascular access device: Secondary | ICD-10-CM | POA: Diagnosis not present

## 2023-08-05 MED ORDER — HEPARIN SOD (PORK) LOCK FLUSH 100 UNIT/ML IV SOLN
500.0000 [IU] | Freq: Once | INTRAVENOUS | Status: AC
  Administered 2023-08-05: 500 [IU]

## 2023-08-05 MED ORDER — SODIUM CHLORIDE 0.9% FLUSH
10.0000 mL | Freq: Once | INTRAVENOUS | Status: AC
  Administered 2023-08-05: 10 mL

## 2023-08-08 ENCOUNTER — Ambulatory Visit (INDEPENDENT_AMBULATORY_CARE_PROVIDER_SITE_OTHER): Payer: Medicare HMO | Admitting: Podiatry

## 2023-08-08 DIAGNOSIS — B351 Tinea unguium: Secondary | ICD-10-CM | POA: Diagnosis not present

## 2023-08-08 DIAGNOSIS — L84 Corns and callosities: Secondary | ICD-10-CM | POA: Diagnosis not present

## 2023-08-08 DIAGNOSIS — E1151 Type 2 diabetes mellitus with diabetic peripheral angiopathy without gangrene: Secondary | ICD-10-CM | POA: Diagnosis not present

## 2023-08-08 DIAGNOSIS — M79675 Pain in left toe(s): Secondary | ICD-10-CM

## 2023-08-08 DIAGNOSIS — M79674 Pain in right toe(s): Secondary | ICD-10-CM

## 2023-08-08 NOTE — Progress Notes (Signed)
Subjective:  Patient ID: Maria Marquez, female    DOB: 04/27/1946,  MRN: [id number]   [name] presents to clinic today for:  Chief Complaint  Patient presents with   [facility]    She is here for the [facility], she would like to try on her shoes and see how the fit.  . Patient notes nails are thick, discolored, elongated and painful in shoegear when trying to ambulate.  Patient presents for diabetic shoe fitting today as well.  PCP is Philip Aspen, Limmie Patricia, MD.  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 Valentino Nose;  due to chemotherapy   Chronic rhinitis    w/ upper airway wheezing due to nasal drainage per pulmonogy note (dr Celine Mans),  using stiolto inhaler   Diabetes mellitus type 2, diet-controlled (HCC)    followed by pcp;    (04-20-2023 pt has started take farxiga daily;    per pt check blood sugar multiple times daily w/ Libre,  fasting average 120--125   Dyspnea on exertion    04-20-2023 pt stated sob w/ long distancewalk and stairs but recovers quickly when stop/ rest,  ok with household chores and short walks   Family history of breast cancer 05/04/2021   Family history of ovarian cancer 05/04/2021   Generalized weakness    GERD (gastroesophageal reflux disease)    History of basal cell carcinoma (BCC) excision    per pt in 1970s on nose   History of chemotherapy    06-13-2020  to 12/ 2021  for right renal pelvis carcinoma   History of colon polyps    History of radiation therapy    11-13-2020 to 11-26-2020---   Right Lung- SBRT- Dr. Antony Blackbird   HLD (hyperlipidemia)     Hypertension    followed by pcp   Hypothyroidism, postsurgical 1979   followed by pcp   Leukocytosis    Nocturia    OA (osteoarthritis)    PONV (postoperative nausea and vomiting)    Since chemo, ponv   Port-A-Cath in place    Primary adenocarcinoma of upper lobe of right lung [facility]) 09/2020   oncologist--- dr dorsey/  pulmonology-- dr n. Celine Mans;  dx 02/ 2022;  s/p  SBRT 11-13-2020 to 11-26-2020   Urgency of urination    wears pads   Varicose veins of both legs with edema    Wears partial dentures    lower    Past Surgical History:  Procedure Laterality Date   COLONOSCOPY  last one 2017   CYSTOSCOPY W/ URETERAL STENT PLACEMENT Right 01/27/2022   Procedure: CYSTOSCOPY WITH RETROGRADE PYELOGRAM/ URETEROSCOPY/POSSIBLE BIOPSY/ POSSIBLE  URETERAL STENT PLACEMENT;  Surgeon: Rene Paci, MD;  Location: [facility];  Service: Urology;  Laterality: Right;   CYSTOSCOPY WITH BIOPSY Right 09/16/2021   Procedure: CYSTOSCOPY / URETEROSCOPY WITH BIOPSY/bugbee fulgeration/right retrograde;  Surgeon: Rene Paci, MD;  Location: [facility];  Service: Urology;  Laterality: Right;   CYSTOSCOPY WITH RETROGRADE PYELOGRAM, URETEROSCOPY AND STENT PLACEMENT Right 04/27/2023  Procedure: CYSTOSCOPY, WITH RIGHT RETROGRADE PYELOGRAM, RIGHT URETEROSCOPY, RIGHT URETERAL STENT PLACEMENT, FULGURATION OF RIGHT RENAL PELVIS TUMORS;  Surgeon: Rene Paci, MD;  Location: [facility];  Service: Urology;  Laterality: Right;   CYSTOSCOPY WITH STENT PLACEMENT Right 10/01/2022   Procedure: CYSTOSCOPY WITH STENT PLACEMENT;  Surgeon: Rene Paci, MD;  Location: [facility];  Service: Urology;  Laterality: Right;   CYSTOSCOPY WITH URETEROSCOPY Right 04/18/2020   Procedure: CYSTOSCOPY WITH URETEROSCOPY, BIOPSY;  Surgeon: Rene Paci, MD;  Location: [facility];  Service: Urology;  Laterality: Right;  ONLY NEEDS 45  MIN   CYSTOSCOPY WITH URETEROSCOPY Right 06/02/2022   Procedure: CYSTOSCOPY WITH URETEROSCOPY;  Surgeon: Rene Paci, MD;  Location: [facility];  Service: Urology;  Laterality: Right;  ONLY NEEDS 60 MINS   CYSTOSCOPY WITH URETEROSCOPY AND STENT PLACEMENT N/A 12/31/2020   Procedure: CYSTOSCOPY WITH RIGHT URETEROSCOPY/ BILATERAL RETROGRADE PYELOGRAM/RIGHT URETERAL BIOPSY/ LASER ABLATION/RIGHT STENT PLACEMENT;  Surgeon: Rene Paci, MD;  Location: [facility];  Service: Urology;  Laterality: N/A;   CYSTOSCOPY/RETROGRADE/URETEROSCOPY Right 04/22/2021   Procedure: CYSTOSCOPY/RETROGRADE/URETEROSCOPY/ STENT PLACEMENT;  Surgeon: Rene Paci, MD;  Location: [facility];  Service: Urology;  Laterality: Right;   EYE SURGERY Bilateral 1987   tear ducts   IR IMAGING GUIDED PORT INSERTION  06/04/2020   IR NEPHROSTOMY PLACEMENT RIGHT  03/03/2022   THYROID LOBECTOMY Right 1979   TOTAL HIP ARTHROPLASTY Right 01/28/2016   Procedure: RIGHT TOTAL HIP ARTHROPLASTY ANTERIOR APPROACH;  Surgeon: Ollen Gross, MD;  Location: [facility];  Service: Orthopedics;  Laterality: Right;   UMBILICAL HERNIA REPAIR  1980s   URETEROSCOPY Right 10/01/2022   Procedure: URETEROSCOPY WITH BIOPSY pylegram;  Surgeon: Rene Paci, MD;  Location: [facility];  Service: Urology;  Laterality: Right;  45 MINS   VIDEO BRONCHOSCOPY WITH ENDOBRONCHIAL NAVIGATION N/A 06/06/2020   Procedure: VIDEO BRONCHOSCOPY WITH ENDOBRONCHIAL NAVIGATION;  Surgeon: Leslye Peer, MD;  Location: [facility];  Service: Thoracic;  Laterality: N/A;   VIDEO BRONCHOSCOPY WITH ENDOBRONCHIAL NAVIGATION N/A 10/10/2020   Procedure: VIDEO BRONCHOSCOPY WITH ENDOBRONCHIAL NAVIGATION;  Surgeon: Loreli Slot, MD;  Location: [facility];  Service: Thoracic;  Laterality: N/A;    Allergies  Allergen Reactions   Iodine Shortness Of Breath and Other (See Comments)   Keflex  [Cephalexin] Shortness Of Breath, Rash and Tinitus   Shellfish Allergy Hives   Shellfish-Derived Products Anaphylaxis and Hives   Hydrocodone Hives and Itching    Itching throat   Lasix [Furosemide] Itching   Rosuvastatin Other (See Comments)    Muscle Pain in Thighs   Ciprofloxacin Other (See Comments)   Latex Rash   Lisinopril Cough   Sulfa Antibiotics Nausea Only   Tramadol Itching    Review of Systems: Negative except as noted in the HPI.  Objective:  Abbygail Rider is a pleasant 77 y.o. female in NAD. AAO x 3.  Vascular Examination: Capillary refill time is 3-5 seconds to toes bilateral. Palpable pedal pulses b/l LE. Digital hair present b/l.  Skin temperature gradient WNL b/l. No varicosities b/l. No cyanosis noted b/l.   Dermatological Examination: Pedal skin with normal turgor, texture and tone b/l. No open wounds. No interdigital macerations b/l. Toenails x10 are 3mm thick, discolored, dystrophic with subungual debris. There is pain with compression of the nail plates.  They are elongated x10     Latest Ref Rng & Units 10/07/2022    1:17 PM  Hemoglobin A1C  Hemoglobin-A1c 4.0 - 5.6 % 6.7    Assessment/Plan: 1. Pain due to onychomycosis of toenails of both feet   2. Type II diabetes mellitus with peripheral circulatory disorder (HCC)   3. Pre-ulcerative calluses     The mycotic toenails were sharply debrided x10 with sterile nail nippers and a power debriding burr to decrease bulk/thickness and length.    Patient was fitted for her diabetic shoes today with her 3 pairs of custom diabetic insoles.  On donning the shoes with the inserts inside, these appear to be a good fit with no heel slippage and no prominent areas.  There appeared to be adequate room at the end of the toes to the end of the shoe.  Patient was counseled on the break-in process of the shoes at home and not to get them dirty over the next few weeks while getting adjusted to them.  If any adjustments need  be made that she is need to be in good condition to allow for exchange.  Return in about 3 months (around 11/06/2023) for [facility].   Clerance Lav, DPM, FACFAS [facility]     [address] [place], [place] [address]                Office [phone]  Fax [phone]

## 2023-08-09 ENCOUNTER — Ambulatory Visit: Payer: Medicare HMO | Admitting: Podiatry

## 2023-08-10 ENCOUNTER — Other Ambulatory Visit: Payer: Self-pay | Admitting: Internal Medicine

## 2023-08-10 DIAGNOSIS — C3411 Malignant neoplasm of upper lobe, right bronchus or lung: Secondary | ICD-10-CM

## 2023-08-19 DIAGNOSIS — J180 Bronchopneumonia, unspecified organism: Secondary | ICD-10-CM | POA: Diagnosis not present

## 2023-08-19 DIAGNOSIS — R059 Cough, unspecified: Secondary | ICD-10-CM | POA: Diagnosis not present

## 2023-08-23 ENCOUNTER — Telehealth: Payer: Self-pay | Admitting: Hematology and Oncology

## 2023-08-23 ENCOUNTER — Encounter: Payer: Self-pay | Admitting: Internal Medicine

## 2023-08-24 ENCOUNTER — Other Ambulatory Visit: Payer: Self-pay | Admitting: Internal Medicine

## 2023-08-24 DIAGNOSIS — Z79890 Hormone replacement therapy (postmenopausal): Secondary | ICD-10-CM

## 2023-08-31 DIAGNOSIS — Z6841 Body Mass Index (BMI) 40.0 and over, adult: Secondary | ICD-10-CM | POA: Diagnosis not present

## 2023-08-31 DIAGNOSIS — Z124 Encounter for screening for malignant neoplasm of cervix: Secondary | ICD-10-CM | POA: Diagnosis not present

## 2023-08-31 DIAGNOSIS — Z1151 Encounter for screening for human papillomavirus (HPV): Secondary | ICD-10-CM | POA: Diagnosis not present

## 2023-08-31 DIAGNOSIS — Z1231 Encounter for screening mammogram for malignant neoplasm of breast: Secondary | ICD-10-CM | POA: Diagnosis not present

## 2023-09-01 ENCOUNTER — Ambulatory Visit: Payer: Medicare HMO

## 2023-09-01 DIAGNOSIS — C651 Malignant neoplasm of right renal pelvis: Secondary | ICD-10-CM | POA: Diagnosis not present

## 2023-09-01 NOTE — Progress Notes (Signed)
 Patient was here picked out 2nd pair of shoes called and ordered [id number] black New balance sz 11 2E per tiffany is in stock and will ship  Order# [phone]

## 2023-09-06 DIAGNOSIS — N189 Chronic kidney disease, unspecified: Secondary | ICD-10-CM | POA: Diagnosis not present

## 2023-09-06 DIAGNOSIS — C3491 Malignant neoplasm of unspecified part of right bronchus or lung: Secondary | ICD-10-CM | POA: Diagnosis not present

## 2023-09-06 DIAGNOSIS — R0602 Shortness of breath: Secondary | ICD-10-CM | POA: Diagnosis not present

## 2023-09-06 DIAGNOSIS — J189 Pneumonia, unspecified organism: Secondary | ICD-10-CM | POA: Diagnosis not present

## 2023-09-12 ENCOUNTER — Telehealth: Payer: Self-pay | Admitting: Pharmacy Technician

## 2023-09-12 ENCOUNTER — Other Ambulatory Visit (HOSPITAL_COMMUNITY): Payer: Self-pay

## 2023-09-12 NOTE — Telephone Encounter (Signed)
Pharmacy Patient Advocate Encounter   Received notification from CoverMyMeds that prior authorization for FreeStyle Libre 3 Sensor is required/requested.   Insurance verification completed.   The patient is insured through [place] .   Per test claim: Refill too soon. PA is not needed at this time. Medication was filled 08/29/2023. Next eligible fill date is 09/19/2023.

## 2023-09-13 ENCOUNTER — Inpatient Hospital Stay: Payer: Medicare HMO | Attending: Oncology

## 2023-09-13 DIAGNOSIS — Z452 Encounter for adjustment and management of vascular access device: Secondary | ICD-10-CM | POA: Diagnosis not present

## 2023-09-13 DIAGNOSIS — Z85118 Personal history of other malignant neoplasm of bronchus and lung: Secondary | ICD-10-CM | POA: Diagnosis not present

## 2023-09-13 DIAGNOSIS — Z8553 Personal history of malignant neoplasm of renal pelvis: Secondary | ICD-10-CM | POA: Diagnosis not present

## 2023-09-13 DIAGNOSIS — Z95828 Presence of other vascular implants and grafts: Secondary | ICD-10-CM

## 2023-09-13 MED ORDER — HEPARIN SOD (PORK) LOCK FLUSH 100 UNIT/ML IV SOLN
500.0000 [IU] | Freq: Once | INTRAVENOUS | Status: AC
  Administered 2023-09-13: 500 [IU]

## 2023-09-13 MED ORDER — SODIUM CHLORIDE 0.9% FLUSH
10.0000 mL | Freq: Once | INTRAVENOUS | Status: AC
  Administered 2023-09-13: 10 mL

## 2023-09-19 DIAGNOSIS — J189 Pneumonia, unspecified organism: Secondary | ICD-10-CM | POA: Diagnosis not present

## 2023-09-19 DIAGNOSIS — R918 Other nonspecific abnormal finding of lung field: Secondary | ICD-10-CM | POA: Diagnosis not present

## 2023-10-01 ENCOUNTER — Other Ambulatory Visit: Payer: Self-pay | Admitting: Internal Medicine

## 2023-10-01 DIAGNOSIS — Z79890 Hormone replacement therapy (postmenopausal): Secondary | ICD-10-CM

## 2023-10-11 ENCOUNTER — Other Ambulatory Visit: Payer: Medicare HMO

## 2023-10-14 ENCOUNTER — Ambulatory Visit: Payer: Medicare HMO

## 2023-10-14 VITALS — Ht 65.5 in | Wt 278.0 lb

## 2023-10-14 DIAGNOSIS — Z0000 Encounter for general adult medical examination without abnormal findings: Secondary | ICD-10-CM | POA: Diagnosis not present

## 2023-10-14 DIAGNOSIS — Z1211 Encounter for screening for malignant neoplasm of colon: Secondary | ICD-10-CM

## 2023-10-14 NOTE — Patient Instructions (Addendum)
Ms. Recht , Thank you for taking time to come for your Medicare Wellness Visit. I appreciate your ongoing commitment to your health goals. Please review the following plan we discussed and let me know if I can assist you in the future.   Referrals/Orders/Follow-Ups/Clinician Recommendations:   This is a list of the screening recommended for you and due dates:  Health Maintenance  Topic Date Due   Colon Cancer Screening  12/22/2020   Hemoglobin A1C  04/07/2023   COVID-19 Vaccine (5 - 2024-25 season) 04/24/2023   Complete foot exam   07/21/2023   Eye exam for diabetics  10/27/2023   DTaP/Tdap/Td vaccine (2 - Td or Tdap) 06/16/2024   Yearly kidney function blood test for diabetes  07/15/2024   Yearly kidney health urinalysis for diabetes  07/15/2024   Mammogram  08/30/2024   Medicare Annual Wellness Visit  10/13/2024   DEXA scan (bone density measurement)  05/12/2026   Pneumonia Vaccine  Completed   Flu Shot  Completed   Hepatitis C Screening  Completed   Zoster (Shingles) Vaccine  Completed   HPV Vaccine  Aged Out    Advanced directives: (In Chart) A copy of your advanced directives are scanned into your chart should your provider ever need it.  Next Medicare Annual Wellness Visit scheduled for next year: Yes

## 2023-10-14 NOTE — Progress Notes (Signed)
Subjective:   Maria Marquez is a 78 y.o. female who presents for Medicare Annual (Subsequent) preventive examination.  Visit Complete: Virtual I connected with  Maria Marquez on 10/14/23 by a audio enabled telemedicine application and verified that I am speaking with the correct person using two identifiers.  Patient Location: Home  Provider Location: Home Office  I discussed the limitations of evaluation and management by telemedicine. The patient expressed understanding and agreed to proceed.  Vital Signs: Because this visit was a virtual/telehealth visit, some criteria may be missing or patient reported. Any vitals not documented were not able to be obtained and vitals that have been documented are patient reported.    Cardiac Risk Factors include: advanced age (>45men, >14 women);diabetes mellitus;hypertension;obesity (BMI >30kg/m2)     Objective:    Today's Vitals   10/14/23 1546  Weight: 278 lb (126.1 kg)  Height: 5' 5.5" (1.664 m)   Body mass index is 45.56 kg/m.     10/14/2023    3:55 PM 04/27/2023    8:00 AM 10/01/2022   10:57 AM 06/02/2022    9:10 AM 02/15/2022   11:01 AM 01/27/2022    6:51 AM 10/26/2021   11:13 AM  Advanced Directives  Does Patient Have a Medical Advance Directive? Yes Yes Yes Yes Yes Yes Yes  Type of Estate agent of [place];Living will Healthcare Power of [place];Living will Healthcare Power of [place];Living will  Healthcare Power of [place];Living will Healthcare Power of [place];Living will Healthcare Power of [place];Living will  Does patient want to make changes to medical advance directive? No - Patient declined No - Patient declined No - Patient declined No - Patient declined  No - Patient declined No - Patient declined  Copy of Healthcare Power of Attorney in Chart? Yes - validated most recent copy scanned in chart (See row information) No - copy requested No - copy requested   No - copy requested Yes - validated  most recent copy scanned in chart (See row information)  Would patient like information on creating a medical advance directive?       No - Patient declined    Current Medications (verified) Outpatient Encounter Medications as of 10/14/2023  Medication Sig   albuterol (VENTOLIN HFA) 108 (90 Base) MCG/ACT inhaler INHALE ONE OR TWO PUFFS BY MOUTH INTO THE LUNGS EVERY SIX HOURS AS NEEDED FOR WHEEZING OR SHORTNESS OF BREATH   atorvastatin (LIPITOR) 20 MG tablet TAKE 1 TABLET EVERY DAY (Patient taking differently: Take 20 mg by mouth at bedtime.)   Continuous Blood Gluc Receiver (FREESTYLE LIBRE 3 READER) DEVI 1 each by Does not apply route daily.   Continuous Glucose Sensor (FREESTYLE LIBRE 3 SENSOR) MISC PLACE ONE SENSOR ONTO THE SKIN ONCE EVERY 14 DAYS; *CHECK GLUCOSE CONTINUOUSLY* REMOVE OLD SENSOR   dapagliflozin propanediol (FARXIGA) 10 MG TABS tablet Take 10 mg by mouth daily. 1 daily   EPINEPHRINE 0.3 mg/0.3 mL IJ SOAJ injection INJECT 0.3MG  INTO THE MUSCLE AS NEEDED FOR ANAPHYLAXIS (Patient taking differently: Inject 0.3 mg into the muscle as needed for anaphylaxis.)   estradiol-norethindrone (ACTIVELLA) 1-0.5 MG tablet TAKE ONE TABLET BY MOUTH ONCE A DAY   ezetimibe (ZETIA) 10 MG tablet TAKE 1 TABLET EVERY DAY (Patient taking differently: Take 10 mg by mouth at bedtime.)   fluticasone (FLONASE) 50 MCG/ACT nasal spray Place 1 spray into both nostrils daily. (Patient taking differently: Place 1 spray into both nostrils daily as needed.)   levothyroxine (SYNTHROID) 150 MCG tablet TAKE 1  TABLET EVERY DAY (Patient taking differently: Take 150 mcg by mouth at bedtime.)   lidocaine-prilocaine (EMLA) cream Apply 1 application. topically as needed.   losartan-hydrochlorothiazide (HYZAAR) 100-25 MG tablet Take 1 tablet by mouth daily.   Multiple Vitamins-Minerals (CENTRUM SILVER 50+WOMEN) TABS Take 2 tablets by mouth daily.   oxybutynin (DITROPAN) 5 MG tablet Take 1 tablet (5 mg total) by mouth every 8  (eight) hours as needed for bladder spasms.   pantoprazole (PROTONIX) 40 MG tablet Take 1 tablet (40 mg total) by mouth daily. (Patient taking differently: Take 40 mg by mouth daily as needed.)   phenazopyridine (PYRIDIUM) 200 MG tablet Take 1 tablet (200 mg total) by mouth 3 (three) times daily as needed (for pain with urination).   Tiotropium Bromide-Olodaterol (STIOLTO RESPIMAT) 2.5-2.5 MCG/ACT AERS Inhale 2 puffs into the lungs daily after lunch.   XIIDRA 5 % SOLN Place 1 drop into both eyes 2 (two) times daily.   No facility-administered encounter medications on file as of 10/14/2023.    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 Valentino Nose;  due to chemotherapy   Chronic rhinitis    w/ upper airway wheezing due to nasal drainage per pulmonogy note (dr Celine Mans),  using stiolto inhaler   Diabetes mellitus type 2, diet-controlled (HCC)    followed by pcp;    (04-20-2023 pt has started take farxiga daily;    per pt check blood sugar multiple times daily w/ Libre,  fasting average 120--125   Dyspnea on exertion    04-20-2023 pt stated sob w/ long distancewalk and stairs but recovers quickly when stop/ rest,  ok with household chores and short walks   Family history of breast cancer 05/04/2021   Family history of ovarian cancer 05/04/2021   Generalized weakness    GERD  (gastroesophageal reflux disease)    History of basal cell carcinoma (BCC) excision    per pt in 1970s on nose   History of chemotherapy    06-13-2020  to 12/ 2021  for right renal pelvis carcinoma   History of colon polyps    History of radiation therapy    11-13-2020 to 11-26-2020---   Right Lung- SBRT- Dr. Antony Blackbird   HLD (hyperlipidemia)    Hypertension    followed by pcp   Hypothyroidism, postsurgical 1979   followed by pcp   Leukocytosis    Nocturia    OA (osteoarthritis)    PONV (postoperative nausea and vomiting)    Since chemo, ponv   Port-A-Cath in place    Primary adenocarcinoma of upper lobe of right lung [facility]) 09/2020   oncologist--- dr dorsey/  pulmonology-- dr n. Celine Mans;  dx 02/ 2022;  s/p  SBRT 11-13-2020 to 11-26-2020   Urgency of urination    wears pads   Varicose veins of both legs with edema    Wears partial dentures    lower   Past  Surgical History:  Procedure Laterality Date   COLONOSCOPY  last one 2017   CYSTOSCOPY W/ URETERAL STENT PLACEMENT Right 01/27/2022   Procedure: CYSTOSCOPY WITH RETROGRADE PYELOGRAM/ URETEROSCOPY/POSSIBLE BIOPSY/ POSSIBLE  URETERAL STENT PLACEMENT;  Surgeon: Rene Paci, MD;  Location: [facility];  Service: Urology;  Laterality: Right;   CYSTOSCOPY WITH BIOPSY Right 09/16/2021   Procedure: CYSTOSCOPY / URETEROSCOPY WITH BIOPSY/bugbee fulgeration/right retrograde;  Surgeon: Rene Paci, MD;  Location: [facility];  Service: Urology;  Laterality: Right;   CYSTOSCOPY WITH RETROGRADE PYELOGRAM, URETEROSCOPY AND STENT PLACEMENT Right 04/27/2023   Procedure: CYSTOSCOPY, WITH RIGHT RETROGRADE PYELOGRAM, RIGHT URETEROSCOPY, RIGHT URETERAL STENT PLACEMENT, FULGURATION OF RIGHT RENAL PELVIS TUMORS;  Surgeon: Rene Paci, MD;  Location: [facility];  Service: Urology;  Laterality: Right;   CYSTOSCOPY WITH STENT PLACEMENT Right 10/01/2022   Procedure: CYSTOSCOPY  WITH STENT PLACEMENT;  Surgeon: Rene Paci, MD;  Location: [facility];  Service: Urology;  Laterality: Right;   CYSTOSCOPY WITH URETEROSCOPY Right 04/18/2020   Procedure: CYSTOSCOPY WITH URETEROSCOPY, BIOPSY;  Surgeon: Rene Paci, MD;  Location: [facility];  Service: Urology;  Laterality: Right;  ONLY NEEDS 45 MIN   CYSTOSCOPY WITH URETEROSCOPY Right 06/02/2022   Procedure: CYSTOSCOPY WITH URETEROSCOPY;  Surgeon: Rene Paci, MD;  Location[facility];  Service: Urology;  Laterality: Right;  ONLY NEEDS 60 MINS   CYSTOSCOPY WITH URETEROSCOPY AND STENT PLACEMENT N/A 12/31/2020   Procedure: CYSTOSCOPY WITH RIGHT URETEROSCOPY/ BILATERAL RETROGRADE PYELOGRAM/RIGHT URETERAL BIOPSY/ LASER ABLATION/RIGHT STENT PLACEMENT;  Surgeon: Rene Paci, MD;  Location: [facility];  Service: Urology;  Laterality: N/A;   CYSTOSCOPY/RETROGRADE/URETEROSCOPY Right 04/22/2021   Procedure: CYSTOSCOPY/RETROGRADE/URETEROSCOPY/ STENT PLACEMENT;  Surgeon: Rene Paci, MD;  Location: [facility];  Service: Urology;  Laterality: Right;   EYE SURGERY Bilateral 1987   tear ducts   IR IMAGING GUIDED PORT INSERTION  06/04/2020   IR NEPHROSTOMY PLACEMENT RIGHT  03/03/2022   THYROID LOBECTOMY Right 1979   TOTAL HIP ARTHROPLASTY Right 01/28/2016   Procedure: RIGHT TOTAL HIP ARTHROPLASTY ANTERIOR APPROACH;  Surgeon: Ollen Gross, MD;  Location: [facility];  Service: Orthopedics;  Laterality: Right;   UMBILICAL HERNIA REPAIR  1980s   URETEROSCOPY Right 10/01/2022   Procedure: URETEROSCOPY WITH BIOPSY pylegram;  Surgeon: Rene Paci, MD;  Location: [facility];  Service: Urology;  Laterality: Right;  45 MINS   VIDEO BRONCHOSCOPY WITH ENDOBRONCHIAL NAVIGATION N/A 06/06/2020   Procedure: VIDEO BRONCHOSCOPY WITH ENDOBRONCHIAL NAVIGATION;  Surgeon: Leslye Peer, MD;  Location: [facility];   Service: Thoracic;  Laterality: N/A;   VIDEO BRONCHOSCOPY WITH ENDOBRONCHIAL NAVIGATION N/A 10/10/2020   Procedure: VIDEO BRONCHOSCOPY WITH ENDOBRONCHIAL NAVIGATION;  Surgeon: Loreli Slot, MD;  Location: [facility];  Service: Thoracic;  Laterality: N/A;   Family History  Problem Relation Age of Onset   Arthritis Mother    Breast cancer Mother 86   Schizophrenia Mother    Diabetes Father    Heart disease Father    Kidney disease Father    CAD Father    Stroke Sister    Lung cancer Brother        dx > 50; work-related exposures   Heart disease Brother    Heart disease Brother    Vision loss Brother        Glaucoma   Alcohol abuse Brother    Ovarian cancer Daughter 63   Lung cancer Maternal  Aunt        dx > 50   Colon cancer Paternal Aunt        dx > 50   Head & neck cancer Paternal Uncle        dx 96s   Leukemia Cousin        d. 28s   Breast cancer Niece 85   Social History   Socioeconomic History   Marital status: Widowed    Spouse name: Not on file   Number of children: Not on file   Years of education: Not on file   Highest education level: Associate degree: occupational, Scientist, product/process development, or vocational program  Occupational History   Occupation: Retired  Tobacco Use   Smoking status: Former    Current packs/day: 0.00    Average packs/day: 1 pack/day for 45.0 years (45.0 ttl pk-yrs)    Types: Cigarettes    Start date: 04/19/1975    Quit date: 04/18/2020    Years since quitting: 3.4   Smokeless tobacco: Never  Vaping Use   Vaping status: Never Used  Substance and Sexual Activity   Alcohol use: Not Currently    Comment: rare   Drug use: No   Sexual activity: Not on file  Other Topics Concern   Not on file  Social History Narrative   Not on file   Social Drivers of Health   Financial Resource Strain: Low Risk  (10/14/2023)   Overall Financial Resource Strain (CARDIA)    Difficulty of Paying Living Expenses: Not hard at all  Food Insecurity: No Food  Insecurity (10/14/2023)   Hunger Vital Sign    Worried About Running Out of Food in the Last Year: Never true    Ran Out of Food in the Last Year: Never true  Transportation Needs: No Transportation Needs (10/14/2023)   PRAPARE - Administrator, Civil Service (Medical): No    Lack of Transportation (Non-Medical): No  Physical Activity: Insufficiently Active (10/14/2023)   Exercise Vital Sign    Days of Exercise per Week: 5 days    Minutes of Exercise per Session: 10 min  Stress: No Stress Concern Present (10/14/2023)   [employer] of Occupational Health - Occupational Stress Questionnaire    Feeling of Stress : Not at all  Social Connections: Moderately Integrated (10/14/2023)   Social Connection and Isolation Panel [NHANES]    Frequency of Communication with Friends and Family: More than three times a week    Frequency of Social Gatherings with Friends and Family: More than three times a week    Attends Religious Services: More than 4 times per year    Active Member of [employer] or Organizations: Yes    Attends Banker Meetings: More than 4 times per year    Marital Status: Widowed    Tobacco Counseling Counseling given: Not Answered   Clinical Intake:  Pre-visit preparation completed: Yes  Pain : No/denies pain     BMI - recorded: 45.55 Nutritional Status: BMI > 30  Obese Nutritional Risks: None Diabetes: Yes CBG done?: No Did pt. bring in CBG monitor from home?: No  How often do you need to have someone help you when you read instructions, pamphlets, or other written materials from your doctor or pharmacy?: 1 - Never  Interpreter Needed?: No  Information entered by :: Theresa Mulligan LPN   Activities of Daily Living    10/14/2023    3:53 PM 04/27/2023    8:06 AM  In your  present state of health, do you have any difficulty performing the following activities:  Hearing? 0 0  Vision? 0 0  Difficulty concentrating or making decisions? 0 0   Walking or climbing stairs? 0 0  Dressing or bathing? 0 0  Doing errands, shopping? 0   Preparing Food and eating ? N   Using the Toilet? N   In the past six months, have you accidently leaked urine? Y   Comment Incontient of urine.Followed by Urologist   Do you have problems with loss of bowel control? N   Managing your Medications? N   Managing your Finances? N   Housekeeping or managing your Housekeeping? N     Patient Care Team: Philip Aspen, Limmie Patricia, MD as PCP - General (Internal Medicine) Meryl Dare, MD (Inactive) as Consulting Physician (Gastroenterology) Myrlene Broker as Consulting Physician (Surgery) Ollen Gross, MD as Consulting Physician (Orthopedic Surgery) Sidney Ace, MD as Referring Physician (Allergy) Cristy Folks, PsyD as Counselor (Psychology) Alfredo Martinez, MD as Consulting Physician (Urology) Sherrill Raring, [facility] (Pharmacist) Shon Millet, MD as Consulting Physician (Ophthalmology)  Indicate any recent Medical Services you may have received from other than [facility] providers in the past year (date may be approximate).     Assessment:   This is a routine wellness examination for [place].  Hearing/Vision screen Hearing Screening - Comments:: Denies hearing difficulties   Vision Screening - Comments:: Wears rx glasses - up to date with routine eye exams with  [facility]   Goals Addressed               This Visit's Progress     Increase physical activity (pt-stated)         Depression Screen    10/14/2023    3:52 PM 12/23/2022    3:13 PM 07/06/2022   12:59 PM 03/17/2022    9:06 AM 02/16/2022    3:29 PM 09/22/2021    1:39 PM 03/19/2021    2:35 PM  PHQ 2/9 Scores  PHQ - 2 Score 0  0  6 0 1  PHQ- 9 Score   3  26 6 4   Exception Documentation  Patient refusal  Patient refusal       Fall Risk    10/14/2023    3:54 PM 12/23/2022    3:13 PM 09/23/2022    7:12 AM 07/06/2022    1:00 PM 02/16/2022    3:30 PM  Fall  Risk   Falls in the past year? 0 1 0 0 0  Number falls in past yr: 0 1  0 0  Injury with Fall? 0 0  0 0  Risk for fall due to : No Fall Risks   No Fall Risks   Follow up Falls prevention discussed;Falls evaluation completed Falls evaluation completed  Falls evaluation completed Falls evaluation completed    MEDICARE RISK AT HOME: Medicare Risk at Home Any stairs in or around the home?: No If so, are there any without handrails?: No Home free of loose throw rugs in walkways, pet beds, electrical cords, etc?: Yes Adequate lighting in your home to reduce risk of falls?: Yes Life alert?: No Use of a cane, walker or w/c?: Yes Grab bars in the bathroom?: Yes Shower chair or bench in shower?: Yes Elevated toilet seat or a handicapped toilet?: Yes  TIMED UP AND GO:  Was the test performed?  No    Cognitive Function:        10/14/2023  3:55 PM 12/31/2019    3:11 PM  6CIT Screen  What Year? 0 points 0 points  What month? 0 points 0 points  What time? 0 points 0 points  Count back from 20 0 points 0 points  Months in reverse 0 points 0 points  Repeat phrase 2 points 0 points  Total Score 2 points 0 points    Immunizations Immunization History  Administered Date(s) Administered   Fluad Quad(high Dose 65+) 04/28/2016, 06/14/2019, 07/11/2020, 05/03/2022, 05/04/2023   Influenza, High Dose Seasonal PF 04/28/2016, 07/19/2017, 05/19/2018, 05/15/2021   Influenza, Seasonal, Injecte, Preservative Fre 04/25/2015   Influenza-Unspecified 04/25/2015, 04/28/2016, 07/12/2017, 05/25/2021, 05/03/2022   Janssen (J&J) SARS-COV-2 Vaccination 04/22/2020   Moderna Sars-Covid-2 Vaccination 05/15/2021   PFIZER(Purple Top)SARS-COV-2 Vaccination 07/03/2020, 01/13/2021   Pneumococcal Conjugate-13 06/13/2015   Pneumococcal Polysaccharide-23 06/10/2016   Pneumococcal-Unspecified 08/27/2019   Rsv, Bivalent, Protein Subunit Rsvpref,pf Verdis Frederickson) 05/18/2023   Tdap 06/16/2014   Zoster Recombinant(Shingrix)  01/08/2020, 05/13/2020   Zoster, Live 05/27/2014    TDAP status: Up to date  Flu Vaccine status: Up to date  Pneumococcal vaccine status: Up to date  Covid-19 vaccine status: Declined, Education has been provided regarding the importance of this vaccine but patient still declined. Advised may receive this vaccine at local pharmacy or Health Dept.or vaccine clinic. Aware to provide a copy of the vaccination record if obtained from local pharmacy or Health Dept. Verbalized acceptance and understanding.  Qualifies for Shingles Vaccine? Yes   Zostavax completed Yes   Shingrix Completed?: Yes  Screening Tests Health Maintenance  Topic Date Due   Colonoscopy  12/22/2020   HEMOGLOBIN A1C  04/07/2023   COVID-19 Vaccine (5 - 2024-25 season) 04/24/2023   FOOT EXAM  07/21/2023   OPHTHALMOLOGY EXAM  10/27/2023   DTaP/Tdap/Td (2 - Td or Tdap) 06/16/2024   Diabetic kidney evaluation - eGFR measurement  07/15/2024   Diabetic kidney evaluation - Urine ACR  07/15/2024   MAMMOGRAM  08/30/2024   Medicare Annual Wellness (AWV)  10/13/2024   DEXA SCAN  05/12/2026   Pneumonia Vaccine 36+ Years old  Completed   INFLUENZA VACCINE  Completed   Hepatitis C Screening  Completed   Zoster Vaccines- Shingrix  Completed   HPV VACCINES  Aged Out    Health Maintenance  Health Maintenance Due  Topic Date Due   Colonoscopy  12/22/2020   HEMOGLOBIN A1C  04/07/2023   COVID-19 Vaccine (5 - 2024-25 season) 04/24/2023   FOOT EXAM  07/21/2023    Colorectal cancer screening: Referral to GI placed 10/14/23. Pt aware the office will call re: appt.  Mammogram status: Completed 08/31/23. Repeat every year  Bone Density status: Completed 05/12/21. Results reflect: Bone density results: NORMAL. Repeat every   years.     Additional Screening:  Hepatitis C Screening: does qualify; Completed 12/28/08  Vision Screening: Recommended annual ophthalmology exams for early detection of glaucoma and other disorders of  the eye. Is the patient up to date with their annual eye exam?  Yes  Who is the provider or what is the name of the office in which the patient attends annual eye exams? Dr Hazle Quant If pt is not established with a provider, would they like to be referred to a provider to establish care? No .   Dental Screening: Recommended annual dental exams for proper oral hygiene  Diabetic Foot Exam: Diabetic Foot Exam: Overdue, Pt has been advised about the importance in completing this exam. Pt is scheduled for diabetic foot exam on Deferred.  Community Resource Referral / Chronic Care Management:  CRR required this visit?  No   CCM required this visit?  No     Plan:     I have personally reviewed and noted the following in the patient's chart:   Medical and social history Use of alcohol, tobacco or illicit drugs  Current medications and supplements including opioid prescriptions. Patient is not currently taking opioid prescriptions. Functional ability and status Nutritional status Physical activity Advanced directives List of other physicians Hospitalizations, surgeries, and ER visits in previous 12 months Vitals Screenings to include cognitive, depression, and falls Referrals and appointments  In addition, I have reviewed and discussed with patient certain preventive protocols, quality metrics, and best practice recommendations. A written personalized care plan for preventive services as well as general preventive health recommendations were provided to patient.     Tillie Rung, LPN   0/16/0109   After Visit Summary: (MyChart) Due to this being a telephonic visit, the after visit summary with patients personalized plan was offered to patient via MyChart   Nurse Notes: None

## 2023-10-19 ENCOUNTER — Inpatient Hospital Stay: Payer: Medicare HMO

## 2023-10-19 ENCOUNTER — Inpatient Hospital Stay: Payer: Medicare HMO | Attending: Oncology

## 2023-10-19 ENCOUNTER — Other Ambulatory Visit: Payer: Self-pay | Admitting: Urology

## 2023-10-19 ENCOUNTER — Inpatient Hospital Stay: Payer: Medicare HMO | Attending: Oncology | Admitting: Hematology and Oncology

## 2023-10-19 ENCOUNTER — Other Ambulatory Visit: Payer: Self-pay | Admitting: Hematology and Oncology

## 2023-10-19 VITALS — BP 142/66 | HR 79 | Temp 97.7°F | Resp 18 | Wt 287.9 lb

## 2023-10-19 DIAGNOSIS — C3490 Malignant neoplasm of unspecified part of unspecified bronchus or lung: Secondary | ICD-10-CM

## 2023-10-19 DIAGNOSIS — Z95828 Presence of other vascular implants and grafts: Secondary | ICD-10-CM

## 2023-10-19 DIAGNOSIS — Z8553 Personal history of malignant neoplasm of renal pelvis: Secondary | ICD-10-CM | POA: Diagnosis not present

## 2023-10-19 DIAGNOSIS — Z85118 Personal history of other malignant neoplasm of bronchus and lung: Secondary | ICD-10-CM | POA: Diagnosis not present

## 2023-10-19 DIAGNOSIS — C659 Malignant neoplasm of unspecified renal pelvis: Secondary | ICD-10-CM | POA: Diagnosis not present

## 2023-10-19 DIAGNOSIS — Z08 Encounter for follow-up examination after completed treatment for malignant neoplasm: Secondary | ICD-10-CM | POA: Insufficient documentation

## 2023-10-19 DIAGNOSIS — N179 Acute kidney failure, unspecified: Secondary | ICD-10-CM | POA: Insufficient documentation

## 2023-10-19 DIAGNOSIS — Z452 Encounter for adjustment and management of vascular access device: Secondary | ICD-10-CM | POA: Diagnosis not present

## 2023-10-19 DIAGNOSIS — R7989 Other specified abnormal findings of blood chemistry: Secondary | ICD-10-CM | POA: Diagnosis not present

## 2023-10-19 LAB — CBC WITH DIFFERENTIAL (CANCER CENTER ONLY)
Abs Immature Granulocytes: 0.04 10*3/uL (ref 0.00–0.07)
Basophils Absolute: 0.1 10*3/uL (ref 0.0–0.1)
Basophils Relative: 1 %
Eosinophils Absolute: 0.2 10*3/uL (ref 0.0–0.5)
Eosinophils Relative: 2 %
HCT: 40.1 % (ref 36.0–46.0)
Hemoglobin: 13.1 g/dL (ref 12.0–15.0)
Immature Granulocytes: 0 %
Lymphocytes Relative: 17 %
Lymphs Abs: 1.5 10*3/uL (ref 0.7–4.0)
MCH: 30.4 pg (ref 26.0–34.0)
MCHC: 32.7 g/dL (ref 30.0–36.0)
MCV: 93 fL (ref 80.0–100.0)
Monocytes Absolute: 0.8 10*3/uL (ref 0.1–1.0)
Monocytes Relative: 8 %
Neutro Abs: 6.4 10*3/uL (ref 1.7–7.7)
Neutrophils Relative %: 72 %
Platelet Count: 244 10*3/uL (ref 150–400)
RBC: 4.31 MIL/uL (ref 3.87–5.11)
RDW: 13.9 % (ref 11.5–15.5)
WBC Count: 9 10*3/uL (ref 4.0–10.5)
nRBC: 0 % (ref 0.0–0.2)

## 2023-10-19 LAB — CMP (CANCER CENTER ONLY)
ALT: 15 U/L (ref 0–44)
AST: 13 U/L — ABNORMAL LOW (ref 15–41)
Albumin: 3.7 g/dL (ref 3.5–5.0)
Alkaline Phosphatase: 85 U/L (ref 38–126)
Anion gap: 8 (ref 5–15)
BUN: 22 mg/dL (ref 8–23)
CO2: 25 mmol/L (ref 22–32)
Calcium: 8.8 mg/dL — ABNORMAL LOW (ref 8.9–10.3)
Chloride: 105 mmol/L (ref 98–111)
Creatinine: 1.43 mg/dL — ABNORMAL HIGH (ref 0.44–1.00)
GFR, Estimated: 38 mL/min — ABNORMAL LOW (ref >60)
Glucose, Bld: 129 mg/dL — ABNORMAL HIGH (ref 70–99)
Potassium: 3.6 mmol/L (ref 3.5–5.1)
Sodium: 138 mmol/L (ref 135–145)
Total Bilirubin: 0.9 mg/dL (ref 0.0–1.2)
Total Protein: 6.4 g/dL — ABNORMAL LOW (ref 6.5–8.1)

## 2023-10-19 MED ORDER — SODIUM CHLORIDE 0.9% FLUSH
10.0000 mL | Freq: Once | INTRAVENOUS | Status: AC
  Administered 2023-10-19: 10 mL

## 2023-10-19 MED ORDER — HEPARIN SOD (PORK) LOCK FLUSH 100 UNIT/ML IV SOLN
500.0000 [IU] | Freq: Once | INTRAVENOUS | Status: AC
  Administered 2023-10-19: 500 [IU]

## 2023-10-19 NOTE — Progress Notes (Signed)
 [facility] [phone]   [phone]  PROGRESS NOTE  Patient Care Team: Philip Aspen, Limmie Patricia, MD as PCP - General (Internal Medicine) Meryl Dare, MD (Inactive) as Consulting Physician (Gastroenterology) Myrlene Broker as Consulting Physician (Surgery) Ollen Gross, MD as Consulting Physician (Orthopedic Surgery) Sidney Ace, MD as Referring Physician (Allergy) Cristy Folks, PsyD as Counselor (Psychology) Alfredo Martinez, MD as Consulting Physician (Urology) Sherrill Raring, [facility] (Pharmacist) Shon Millet, MD as Consulting Physician (Ophthalmology)  Hematological/Oncological History #  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  She completed 3 cycles and cycle 4 was given without cisplatin completed in December 2021.  10/10/2020: adenocarcinoma of the lung diagnosed  11/13/2020-11/26/2020: SBRT to right lung cancer, received total of 50 Gray and 10 fractions.  04/20/2022: last visit with Dr. Clelia Croft  10/07/2022: transition care to Dr. Leonides Schanz   Interval History:  Maria Marquez 78 y.o. 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 The patient's last visit was on 04/18/2023. In the interim since the last visit she has had no major changes in her health.  On exam today Maria Marquez reports she has had a headache for the last 3 days and has been feeling tired.  She reports that she feels "waterlogged".  She notes that she had a episode of bronchitis in October and pneumonia in December and January.  She reports that her daughter unfortunately passed away from ovarian cancer in December.  She reports that she has been  feeling down since that time.  She reports that she has used Lasix before in the past but does not currently have any.  She notes she is due for a cystoscopy in March 2025 and her CT scan in April of this year.  She reports is not seeing any changes in the urine with blood in the urine or changes in the color.  She is not having any respiratory symptoms such as cough, chest pain, or shortness of breath.  She notes that she is not having any trouble with fevers, chills, sweats, nausea, vomiting or diarrhea.  A full 10 point ROS was otherwise negative.  MEDICAL HISTORY:  Past Medical History:  Diagnosis Date   Anemia associated with chemotherapy    followed by dr Leonides Schanz   Carcinoma of renal pelvis, right [facility]) 03/2020   urologist-- dr winter/  oncologist-- dr Leonides Schanz;   dx 08/ 2021 ;  chemo 10/ 2021  to 12/ 2021 and complete laser tumor ablation;  recurrent 05/ 2023   completed 6 mitomyocin gel infusions via nephrostomy tube 08/ 2023   Chronic kidney disease, stage 3a (HCC) 12/31/2019   nephrologist--- dr s. Valentino Nose;  due to chemotherapy   Chronic rhinitis    w/ upper airway wheezing due to nasal drainage per pulmonogy note (dr Celine Mans),  using stiolto inhaler   Diabetes mellitus type 2, diet-controlled (HCC)    followed by pcp;    (04-20-2023 pt has started take farxiga daily;    per pt check blood sugar multiple times daily w/ Libre,  fasting average 120--125   Dyspnea on exertion    04-20-2023 pt stated sob w/ long distancewalk and stairs but recovers quickly when stop/ rest,  ok with household chores and short walks  Family history of breast cancer 05/04/2021   Family history of ovarian cancer 05/04/2021   Generalized weakness    GERD (gastroesophageal reflux disease)    History of basal cell carcinoma (BCC) excision    per pt in 1970s on nose   History of chemotherapy    06-13-2020  to 12/ 2021  for right renal pelvis carcinoma   History of colon polyps    History of radiation therapy     11-13-2020 to 11-26-2020---   Right Lung- SBRT- Dr. Antony Blackbird   HLD (hyperlipidemia)    Hypertension    followed by pcp   Hypothyroidism, postsurgical 1979   followed by pcp   Leukocytosis    Nocturia    OA (osteoarthritis)    PONV (postoperative nausea and vomiting)    Since chemo, ponv   Port-A-Cath in place    Primary adenocarcinoma of upper lobe of right lung [facility]) 09/2020   oncologist--- dr Jiovany Scheffel/  pulmonology-- dr n. Celine Mans;  dx 02/ 2022;  s/p  SBRT 11-13-2020 to 11-26-2020   Urgency of urination    wears pads   Varicose veins of both legs with edema    Wears partial dentures    lower    SURGICAL HISTORY: Past Surgical History:  Procedure Laterality Date   COLONOSCOPY  last one 2017   CYSTOSCOPY W/ URETERAL STENT PLACEMENT Right 01/27/2022   Procedure: CYSTOSCOPY WITH RETROGRADE PYELOGRAM/ URETEROSCOPY/POSSIBLE BIOPSY/ POSSIBLE  URETERAL STENT PLACEMENT;  Surgeon: Rene Paci, MD;  Location: [facility];  Service: Urology;  Laterality: Right;   CYSTOSCOPY WITH BIOPSY Right 09/16/2021   Procedure: CYSTOSCOPY / URETEROSCOPY WITH BIOPSY/bugbee fulgeration/right retrograde;  Surgeon: Rene Paci, MD;  Location: [facility];  Service: Urology;  Laterality: Right;   CYSTOSCOPY WITH RETROGRADE PYELOGRAM, URETEROSCOPY AND STENT PLACEMENT Right 04/27/2023   Procedure: CYSTOSCOPY, WITH RIGHT RETROGRADE PYELOGRAM, RIGHT URETEROSCOPY, RIGHT URETERAL STENT PLACEMENT, FULGURATION OF RIGHT RENAL PELVIS TUMORS;  Surgeon: Rene Paci, MD;  Location: [facility];  Service: Urology;  Laterality: Right;   CYSTOSCOPY WITH STENT PLACEMENT Right 10/01/2022   Procedure: CYSTOSCOPY WITH STENT PLACEMENT;  Surgeon: Rene Paci, MD;  Location[facility];  Service: Urology;  Laterality: Right;   CYSTOSCOPY WITH URETEROSCOPY Right 04/18/2020   Procedure: CYSTOSCOPY WITH URETEROSCOPY, BIOPSY;   Surgeon: Rene Paci, MD;  Location: [facility];  Service: Urology;  Laterality: Right;  ONLY NEEDS 45 MIN   CYSTOSCOPY WITH URETEROSCOPY Right 06/02/2022   Procedure: CYSTOSCOPY WITH URETEROSCOPY;  Surgeon: Rene Paci, MD;  Location: [facility];  Service: Urology;  Laterality: Right;  ONLY NEEDS 60 MINS   CYSTOSCOPY WITH URETEROSCOPY AND STENT PLACEMENT N/A 12/31/2020   Procedure: CYSTOSCOPY WITH RIGHT URETEROSCOPY/ BILATERAL RETROGRADE PYELOGRAM/RIGHT URETERAL BIOPSY/ LASER ABLATION/RIGHT STENT PLACEMENT;  Surgeon: Rene Paci, MD;  Location: [facility];  Service: Urology;  Laterality: N/A;   CYSTOSCOPY/RETROGRADE/URETEROSCOPY Right 04/22/2021   Procedure: CYSTOSCOPY/RETROGRADE/URETEROSCOPY/ STENT PLACEMENT;  Surgeon: Rene Paci, MD;  Location: [facility];  Service: Urology;  Laterality: Right;   EYE SURGERY Bilateral 1987   tear ducts   IR IMAGING GUIDED PORT INSERTION  06/04/2020   IR NEPHROSTOMY PLACEMENT RIGHT  03/03/2022   THYROID LOBECTOMY Right 1979   TOTAL HIP ARTHROPLASTY Right 01/28/2016   Procedure: RIGHT TOTAL HIP ARTHROPLASTY ANTERIOR APPROACH;  Surgeon: Ollen Gross, MD;  Location: [facility];  Service: Orthopedics;  Laterality: Right;   UMBILICAL HERNIA  REPAIR  1980s   URETEROSCOPY Right 10/01/2022   Procedure: URETEROSCOPY WITH BIOPSY pylegram;  Surgeon: Rene Paci, MD;  Location: [facility];  Service: Urology;  Laterality: Right;  45 MINS   VIDEO BRONCHOSCOPY WITH ENDOBRONCHIAL NAVIGATION N/A 06/06/2020   Procedure: VIDEO BRONCHOSCOPY WITH ENDOBRONCHIAL NAVIGATION;  Surgeon: Leslye Peer, MD;  Location: [facility];  Service: Thoracic;  Laterality: N/A;   VIDEO BRONCHOSCOPY WITH ENDOBRONCHIAL NAVIGATION N/A 10/10/2020   Procedure: VIDEO BRONCHOSCOPY WITH ENDOBRONCHIAL NAVIGATION;  Surgeon: Loreli Slot, MD;  Location: [facility];  Service: Thoracic;   Laterality: N/A;    SOCIAL HISTORY: Social History   Socioeconomic History   Marital status: Widowed    Spouse name: Not on file   Number of children: Not on file   Years of education: Not on file   Highest education level: Associate degree: occupational, Scientist, product/process development, or vocational program  Occupational History   Occupation: Retired  Tobacco Use   Smoking status: Former    Current packs/day: 0.00    Average packs/day: 1 pack/day for 45.0 years (45.0 ttl pk-yrs)    Types: Cigarettes    Start date: 04/19/1975    Quit date: 04/18/2020    Years since quitting: 3.5   Smokeless tobacco: Never  Vaping Use   Vaping status: Never Used  Substance and Sexual Activity   Alcohol use: Not Currently    Comment: rare   Drug use: No   Sexual activity: Not on file  Other Topics Concern   Not on file  Social History Narrative   Not on file   Social Drivers of Health   Financial Resource Strain: Low Risk  (10/14/2023)   Overall Financial Resource Strain (CARDIA)    Difficulty of Paying Living Expenses: Not hard at all  Food Insecurity: No Food Insecurity (10/14/2023)   Hunger Vital Sign    Worried About Running Out of Food in the Last Year: Never true    Ran Out of Food in the Last Year: Never true  Transportation Needs: No Transportation Needs (10/14/2023)   PRAPARE - Administrator, Civil Service (Medical): No    Lack of Transportation (Non-Medical): No  Physical Activity: Insufficiently Active (10/14/2023)   Exercise Vital Sign    Days of Exercise per Week: 5 days    Minutes of Exercise per Session: 10 min  Stress: No Stress Concern Present (10/14/2023)   [employer] of Occupational Health - Occupational Stress Questionnaire    Feeling of Stress : Not at all  Social Connections: Moderately Integrated (10/14/2023)   Social Connection and Isolation Panel [NHANES]    Frequency of Communication with Friends and Family: More than three times a week    Frequency of Social  Gatherings with Friends and Family: More than three times a week    Attends Religious Services: More than 4 times per year    Active Member of [employer] or Organizations: Yes    Attends Banker Meetings: More than 4 times per year    Marital Status: Widowed  Intimate Partner Violence: Not At Risk (10/14/2023)   Humiliation, Afraid, Rape, and Kick questionnaire    Fear of Current or Ex-Partner: No    Emotionally Abused: No    Physically Abused: No    Sexually Abused: No    FAMILY HISTORY: Family History  Problem Relation Age of Onset   Arthritis Mother    Breast cancer Mother 58   Schizophrenia Mother    Diabetes Father  Heart disease Father    Kidney disease Father    CAD Father    Stroke Sister    Lung cancer Brother        dx > 50; work-related exposures   Heart disease Brother    Heart disease Brother    Vision loss Brother        Glaucoma   Alcohol abuse Brother    Ovarian cancer Daughter 85   Lung cancer Maternal Aunt        dx > 50   Colon cancer Paternal Aunt        dx > 50   Head & neck cancer Paternal Uncle        dx 41s   Leukemia Cousin        d. 66s   Breast cancer Niece 40    ALLERGIES:  is allergic to iodine, keflex [cephalexin], shellfish allergy, shellfish-derived products, hydrocodone, lasix [furosemide], rosuvastatin, ciprofloxacin, latex, lisinopril, sulfa antibiotics, and tramadol.  MEDICATIONS:  Current Outpatient Medications  Medication Sig Dispense Refill   albuterol (VENTOLIN HFA) 108 (90 Base) MCG/ACT inhaler INHALE ONE OR TWO PUFFS BY MOUTH INTO THE LUNGS EVERY SIX HOURS AS NEEDED FOR WHEEZING OR SHORTNESS OF BREATH 8.5 g 0   atorvastatin (LIPITOR) 20 MG tablet TAKE 1 TABLET EVERY DAY (Patient taking differently: Take 20 mg by mouth at bedtime.) 90 tablet 1   Continuous Blood Gluc Receiver (FREESTYLE LIBRE 3 READER) DEVI 1 each by Does not apply route daily. 1 each 3   Continuous Glucose Sensor (FREESTYLE LIBRE 3 SENSOR) MISC  PLACE ONE SENSOR ONTO THE SKIN ONCE EVERY 14 DAYS; *CHECK GLUCOSE CONTINUOUSLY* REMOVE OLD SENSOR 2 each 3   dapagliflozin propanediol (FARXIGA) 10 MG TABS tablet Take 10 mg by mouth daily. 1 daily     EPINEPHRINE 0.3 mg/0.3 mL IJ SOAJ injection INJECT 0.3MG  INTO THE MUSCLE AS NEEDED FOR ANAPHYLAXIS (Patient taking differently: Inject 0.3 mg into the muscle as needed for anaphylaxis.) 2 each 0   estradiol-norethindrone (ACTIVELLA) 1-0.5 MG tablet TAKE ONE TABLET BY MOUTH ONCE A DAY 28 tablet 0   ezetimibe (ZETIA) 10 MG tablet TAKE 1 TABLET EVERY DAY (Patient taking differently: Take 10 mg by mouth at bedtime.) 90 tablet 3   fluticasone (FLONASE) 50 MCG/ACT nasal spray Place 1 spray into both nostrils daily. (Patient taking differently: Place 1 spray into both nostrils daily as needed.) 48 each 11   levothyroxine (SYNTHROID) 150 MCG tablet TAKE 1 TABLET EVERY DAY (Patient taking differently: Take 150 mcg by mouth at bedtime.) 90 tablet 3   lidocaine-prilocaine (EMLA) cream Apply 1 application. topically as needed. 30 g 0   losartan-hydrochlorothiazide (HYZAAR) 100-25 MG tablet Take 1 tablet by mouth daily.     Multiple Vitamins-Minerals (CENTRUM SILVER 50+WOMEN) TABS Take 2 tablets by mouth daily.     oxybutynin (DITROPAN) 5 MG tablet Take 1 tablet (5 mg total) by mouth every 8 (eight) hours as needed for bladder spasms. 30 tablet 1   pantoprazole (PROTONIX) 40 MG tablet Take 1 tablet (40 mg total) by mouth daily. (Patient taking differently: Take 40 mg by mouth daily as needed.) 90 tablet 1   phenazopyridine (PYRIDIUM) 200 MG tablet Take 1 tablet (200 mg total) by mouth 3 (three) times daily as needed (for pain with urination). 30 tablet 0   Tiotropium Bromide-Olodaterol (STIOLTO RESPIMAT) 2.5-2.5 MCG/ACT AERS Inhale 2 puffs into the lungs daily after lunch. 4 g 11   XIIDRA 5 % SOLN Place 1  drop into both eyes 2 (two) times daily.     No current facility-administered medications for this visit.     REVIEW OF SYSTEMS:   Constitutional: ( - ) fevers, ( - )  chills , ( - ) night sweats Eyes: ( - ) blurriness of vision, ( - ) double vision, ( - ) watery eyes Ears, nose, mouth, throat, and face: ( - ) mucositis, ( - ) sore throat Respiratory: ( - ) cough, ( - ) dyspnea, ( - ) wheezes Cardiovascular: ( - ) palpitation, ( - ) chest discomfort, ( - ) lower extremity swelling Gastrointestinal:  ( - ) nausea, ( - ) heartburn, ( - ) change in bowel habits Skin: ( - ) abnormal skin rashes Lymphatics: ( - ) new lymphadenopathy, ( - ) easy bruising Neurological: ( - ) numbness, ( - ) tingling, ( - ) new weaknesses Behavioral/Psych: ( - ) mood change, ( - ) new changes  All other systems were reviewed with the patient and are negative.  PHYSICAL EXAMINATION:  Vitals:   10/19/23 1428  BP: (!) 142/66  Pulse: 79  Resp: 18  Temp: 97.7 F (36.5 C)  SpO2: 92%   Filed Weights   10/19/23 1428  Weight: 287 lb 14.4 oz (130.6 kg)    GENERAL: Well-appearing elderly Caucasian female, alert, no distress and comfortable SKIN: skin color, texture, turgor are normal, no rashes or significant lesions EYES: conjunctiva are pink and non-injected, sclera clear LUNGS: clear to auscultation and percussion with normal breathing effort HEART: regular rate & rhythm and no murmurs and no lower extremity edema Musculoskeletal: no cyanosis of digits and no clubbing  PSYCH: alert & oriented x 3, fluent speech NEURO: no focal motor/sensory deficits  LABORATORY DATA:  I have reviewed the data as listed    Latest Ref Rng & Units 10/19/2023    1:49 PM 04/27/2023    8:20 AM 04/18/2023    2:22 PM  CBC  WBC 4.0 - 10.5 K/uL 9.0   8.1   Hemoglobin 12.0 - 15.0 g/dL 27.0  62.3  76.2   Hematocrit 36.0 - 46.0 % 40.1  42.0  41.3   Platelets 150 - 400 K/uL 244   218        Latest Ref Rng & Units 10/19/2023    1:49 PM 04/27/2023    8:20 AM 04/18/2023    2:22 PM  CMP  Glucose 70 - 99 mg/dL 831  517  99   BUN 8 - 23  mg/dL 22  38  31   Creatinine 0.44 - 1.00 mg/dL 6.16  0.73  7.10   Sodium 135 - 145 mmol/L 138  139  140   Potassium 3.5 - 5.1 mmol/L 3.6  3.2  3.8   Chloride 98 - 111 mmol/L 105  101  105   CO2 22 - 32 mmol/L 25   27   Calcium 8.9 - 10.3 mg/dL 8.8   8.5   Total Protein 6.5 - 8.1 g/dL 6.4   6.6   Total Bilirubin 0.0 - 1.2 mg/dL 0.9   0.8   Alkaline Phos 38 - 126 U/L 85   82   AST 15 - 41 U/L 13   14   ALT 0 - 44 U/L 15   16     RADIOGRAPHIC STUDIES: No results found.  ASSESSMENT & PLAN Maria Marquez 78 y.o. 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 Liliane Shi  --Salvage therapy options including Pembrolizumab will be deferred for if she developed relapsed disease  --continue to be on active surveillance and repeat cystoscopy  --Labs today show white blood cell count 9.0, Hgb 13.1, MCV 93.0, Plt 244  --RTC in 6 months or sooner if signs/symptoms of recurrent disease.   #Elevated Cr # AKI -- Creatinine elevated to 1.43 today -- next week will see her nephrologist Dr. Valentino Nose  # T1aN0 adenocarcinoma of the lung  -- recommend CT chest q 6 months x 5 years followed by annually thereafter.  -- Continue every 6 month follow up.  Next scan due in April 2025   Orders Placed This Encounter  Procedures   CT CHEST WO CONTRAST    Standing Status:   Future    Expected Date:   12/21/2023    Expiration Date:   10/18/2024    Preferred imaging location?:   [facility]    All questions were answered. The patient knows to call the clinic with any problems, questions or concerns.  A total of more than 30 minutes were spent on this encounter with face-to-face time and  non-face-to-face time, including preparing to see the patient, ordering tests and/or medications, counseling the patient and coordination of care as outlined above.   Ulysees Barns, MD Department of Hematology/Oncology [facility] Phone: [phone] Pager: [phone] Email: [email or web address]  10/19/2023 4:12 PM

## 2023-10-20 ENCOUNTER — Other Ambulatory Visit: Payer: Self-pay | Admitting: Urology

## 2023-10-24 DIAGNOSIS — N1832 Chronic kidney disease, stage 3b: Secondary | ICD-10-CM | POA: Diagnosis not present

## 2023-10-27 DIAGNOSIS — E1122 Type 2 diabetes mellitus with diabetic chronic kidney disease: Secondary | ICD-10-CM | POA: Diagnosis not present

## 2023-10-27 DIAGNOSIS — I129 Hypertensive chronic kidney disease with stage 1 through stage 4 chronic kidney disease, or unspecified chronic kidney disease: Secondary | ICD-10-CM | POA: Diagnosis not present

## 2023-10-27 DIAGNOSIS — N1832 Chronic kidney disease, stage 3b: Secondary | ICD-10-CM | POA: Diagnosis not present

## 2023-10-27 DIAGNOSIS — C3411 Malignant neoplasm of upper lobe, right bronchus or lung: Secondary | ICD-10-CM | POA: Diagnosis not present

## 2023-10-27 DIAGNOSIS — R609 Edema, unspecified: Secondary | ICD-10-CM | POA: Diagnosis not present

## 2023-10-28 NOTE — Progress Notes (Addendum)
 COVID Vaccine received:  []  No [x]  Yes Date of any COVID positive Test in last 90 days: no PCP - Chaya Jan MD Cardiologist - n/a  Chest x-ray - 06/03/23 Epic EKG -  04/27/23 Epic Stress Test -  ECHO - 02/11/23 Epic Cardiac Cath -   Bowel Prep - [x]  No  []   Yes ______  Pacemaker / ICD device [x]  No []  Yes   Spinal Cord Stimulator:[x]  No []  Yes       History of Sleep Apnea? [x]  No []  Yes   CPAP used?- [x]  No []  Yes    Does the patient monitor blood sugar?          []  No [x]  Yes  []  N/A  Patient has: []  NO Hx DM   []  Pre-DM                 []  DM1  [x]   DM2 Does patient have a [employer] or Dexacom? []  No [x]  Yes   Fasting Blood Sugar Ranges-  Checks Blood Sugar _____ times a day  GLP1 agonist / usual dose - no GLP1 instructions:  SGLT-2 inhibitors / usual dose - no SGLT-2 instructions:   Blood Thinner / Instructions:no Aspirin Instructions:no Comments:   Activity level: Patient is able  to climb a flight of stairs without difficulty; [x]  No CP  [x]  No SOB, but would have ___   Patient can  perform ADLs without assistance.   Anesthesia review:   Patient denies shortness of breath, fever, cough and chest pain at PAT appointment.  Patient verbalized understanding and agreement to the Pre-Surgical Instructions that were given to them at this PAT appointment. Patient was also educated of the need to review these PAT instructions again prior to his/her surgery.I reviewed the appropriate phone numbers to call if they have any and questions or concerns.

## 2023-10-28 NOTE — Patient Instructions (Signed)
 SURGICAL WAITING ROOM VISITATION  Patients having surgery or a procedure may have no more than 2 support people in the waiting area - these visitors may rotate.    Children under the age of 30 must have an adult with them who is not the patient.  Due to an increase in RSV and influenza rates and associated hospitalizations, children ages 54 and under may not visit patients in [facility] hospitals.  Visitors with respiratory illnesses are discouraged from visiting and should remain at home.  If the patient needs to stay at the hospital during part of their recovery, the visitor guidelines for inpatient rooms apply. Pre-op nurse will coordinate an appropriate time for 1 support person to accompany patient in pre-op.  This support person may not rotate.    Please refer to the [facility] website for the visitor guidelines for Inpatients (after your surgery is over and you are in a regular room).       Your procedure is scheduled on: 11/02/23   Report to [facility] Main Entrance    Report to admitting at 6:15 AM   Call this number if you have problems the morning of surgery [phone]   Do not eat food  OR DRINK ANY LIQUIDS.:After Midnight. mAY HAVE MEDS WITH SIPS OF WATER.        Oral Hygiene is also important to reduce your risk of infection.                                    Remember - BRUSH YOUR TEETH THE MORNING OF SURGERY WITH YOUR REGULAR TOOTHPASTE  DENTURES WILL BE REMOVED PRIOR TO SURGERY PLEASE DO NOT APPLY "Poly grip" OR ADHESIVES!!!   Stop all vitamins and herbal supplements 7 days before surgery.   Take these medicines the morning of surgery with A SIP OF WATER: ATORVASTATIN, ACTIVELLA(ESTRADIOL-NORETHINDRONE), SYNTHROID, DITROPAN(OXYBUTYNIN), Inhalers, Nasal spray and eye drops  DO NOT TAKE ANY ORAL DIABETIC MEDICATIONS DAY OF YOUR SURGERY Hold Farxiga 72 hours prior to surgery. Last dose             You may not have any metal on your body including  hair pins, jewelry, and body piercing             Do not wear make-up, lotions, powders, perfumes/cologne, or deodorant  Do not wear nail polish including gel and S&S, artificial/acrylic nails, or any other type of covering on natural nails including finger and toenails. If you have artificial nails, gel coating, etc. that needs to be removed by a nail salon please have this removed prior to surgery or surgery may need to be canceled/ delayed if the surgeon/ anesthesia feels like they are unable to be safely monitored.   Do not shave  48 hours prior to surgery.     Do not bring valuables to the hospital. [facility] IS NOT             RESPONSIBLE   FOR VALUABLES.   Contacts, glasses, dentures or bridgework may not be worn into surgery.   DO NOT BRING YOUR HOME MEDICATIONS TO THE HOSPITAL. PHARMACY WILL DISPENSE MEDICATIONS LISTED ON YOUR MEDICATION LIST TO YOU DURING YOUR ADMISSION IN THE HOSPITAL!    Patients discharged on the day of surgery will not be allowed to drive home.  Someone NEEDS to stay with you for the first 24 hours after anesthesia.   Special  Instructions: Bring a copy of your healthcare power of attorney and living will documents the day of surgery if you haven't scanned them before.              Please read over the following fact sheets you were given: IF YOU HAVE QUESTIONS ABOUT YOUR PRE-OP INSTRUCTIONS PLEASE CALL [phone] Maria Marquez    If you received a COVID test during your pre-op visit  it is requested that you wear a mask when out in public, stay away from anyone that may not be feeling well and notify your surgeon if you develop symptoms. If you test positive for Covid or have been in contact with anyone that has tested positive in the last 10 days please notify you surgeon.     - Preparing for Surgery Before surgery, you can play an important role.  Because skin is not sterile, your skin needs to be as free of germs as possible.  You can reduce the  number of germs on your skin by washing with CHG (chlorahexidine gluconate) soap before surgery.  CHG is an antiseptic cleaner which kills germs and bonds with the skin to continue killing germs even after washing. Please DO NOT use if you have an allergy to CHG or antibacterial soaps.  If your skin becomes reddened/irritated stop using the CHG and inform your nurse when you arrive at Short Stay. Do not shave (including legs and underarms) for at least 48 hours prior to the first CHG shower.  You may shave your face/neck.  Please follow these instructions carefully:  1.  Shower with CHG Soap the night before surgery and the  morning of surgery.  2.  If you choose to wash your hair, wash your hair first as usual with your normal  shampoo.  3.  After you shampoo, rinse your hair and body thoroughly to remove the shampoo.                             4.  Use CHG as you would any other liquid soap.  You can apply chg directly to the skin and wash.  Gently with a scrungie or clean washcloth.  5.  Apply the CHG Soap to your body ONLY FROM THE NECK DOWN.   Do   not use on face/ open                           Wound or open sores. Avoid contact with eyes, ears mouth and   genitals (private parts).                       Wash face,  Genitals (private parts) with your normal soap.             6.  Wash thoroughly, paying special attention to the area where your    surgery  will be performed.  7.  Thoroughly rinse your body with warm water from the neck down.  8.  DO NOT shower/wash with your normal soap after using and rinsing off the CHG Soap.                9.  Pat yourself dry with a clean towel.            10.  Wear clean pajamas.            11.  Place clean sheets  on your bed the night of your first shower and do not  sleep with pets. Day of Surgery : Do not apply any lotions/deodorants the morning of surgery.  Please wear clean clothes to the hospital/surgery center.  FAILURE TO FOLLOW THESE  INSTRUCTIONS MAY RESULT IN THE CANCELLATION OF YOUR SURGERY  PATIENT SIGNATURE_________________________________  NURSE SIGNATURE__________________________________  ________________________________________________________________________

## 2023-10-31 ENCOUNTER — Encounter (HOSPITAL_COMMUNITY): Payer: Self-pay

## 2023-10-31 ENCOUNTER — Other Ambulatory Visit: Payer: Self-pay

## 2023-10-31 ENCOUNTER — Encounter (HOSPITAL_COMMUNITY)
Admission: RE | Admit: 2023-10-31 | Discharge: 2023-10-31 | Disposition: A | Discharge status: Home / Self Care | Payer: Medicare HMO | Source: Ambulatory Visit | Attending: Urology | Admitting: Urology

## 2023-10-31 VITALS — BP 126/71 | HR 71 | Temp 98.2°F | Resp 18 | Ht 66.0 in | Wt 276.0 lb

## 2023-10-31 DIAGNOSIS — Z01812 Encounter for preprocedural laboratory examination: Secondary | ICD-10-CM | POA: Insufficient documentation

## 2023-10-31 DIAGNOSIS — I10 Essential (primary) hypertension: Secondary | ICD-10-CM | POA: Diagnosis not present

## 2023-10-31 DIAGNOSIS — E1142 Type 2 diabetes mellitus with diabetic polyneuropathy: Secondary | ICD-10-CM | POA: Insufficient documentation

## 2023-10-31 LAB — GLUCOSE, CAPILLARY: Glucose-Capillary: 160 mg/dL — ABNORMAL HIGH (ref 70–99)

## 2023-10-31 LAB — CBC
HCT: 43.2 % (ref 36.0–46.0)
Hemoglobin: 13.8 g/dL (ref 12.0–15.0)
MCH: 30.4 pg (ref 26.0–34.0)
MCHC: 31.9 g/dL (ref 30.0–36.0)
MCV: 95.2 fL (ref 80.0–100.0)
Platelets: 249 10*3/uL (ref 150–400)
RBC: 4.54 MIL/uL (ref 3.87–5.11)
RDW: 14.1 % (ref 11.5–15.5)
WBC: 9.4 10*3/uL (ref 4.0–10.5)
nRBC: 0 % (ref 0.0–0.2)

## 2023-10-31 LAB — BASIC METABOLIC PANEL
Anion gap: 11 (ref 5–15)
BUN: 36 mg/dL — ABNORMAL HIGH (ref 8–23)
CO2: 24 mmol/L (ref 22–32)
Calcium: 8.8 mg/dL — ABNORMAL LOW (ref 8.9–10.3)
Chloride: 101 mmol/L (ref 98–111)
Creatinine, Ser: 1.6 mg/dL — ABNORMAL HIGH (ref 0.44–1.00)
GFR, Estimated: 33 mL/min — ABNORMAL LOW (ref >60)
Glucose, Bld: 152 mg/dL — ABNORMAL HIGH (ref 70–99)
Potassium: 3.7 mmol/L (ref 3.5–5.1)
Sodium: 136 mmol/L (ref 135–145)

## 2023-10-31 LAB — HEMOGLOBIN A1C
Hgb A1c MFr Bld: 7.2 % — ABNORMAL HIGH (ref 4.8–5.6)
Mean Plasma Glucose: 159.94 mg/dL

## 2023-10-31 NOTE — Progress Notes (Signed)
 Please review pt's pre op BMP from 10/31/23.

## 2023-11-02 ENCOUNTER — Ambulatory Visit (HOSPITAL_BASED_OUTPATIENT_CLINIC_OR_DEPARTMENT_OTHER): Admitting: Certified Registered"

## 2023-11-02 ENCOUNTER — Encounter (HOSPITAL_COMMUNITY)
Admission: RE | Disposition: A | Discharge status: Home / Self Care | Payer: Self-pay | Source: Ambulatory Visit | Attending: Urology

## 2023-11-02 ENCOUNTER — Ambulatory Visit (HOSPITAL_COMMUNITY)
Admission: RE | Admit: 2023-11-02 | Discharge: 2023-11-02 | Disposition: A | Discharge status: Home / Self Care | Payer: Medicare HMO | Source: Ambulatory Visit | Attending: Urology | Admitting: Urology

## 2023-11-02 ENCOUNTER — Encounter (HOSPITAL_COMMUNITY): Payer: Self-pay | Admitting: Urology

## 2023-11-02 ENCOUNTER — Ambulatory Visit (HOSPITAL_COMMUNITY)

## 2023-11-02 ENCOUNTER — Other Ambulatory Visit: Payer: Self-pay

## 2023-11-02 ENCOUNTER — Ambulatory Visit (HOSPITAL_COMMUNITY): Payer: Self-pay | Admitting: Medical

## 2023-11-02 DIAGNOSIS — E039 Hypothyroidism, unspecified: Secondary | ICD-10-CM

## 2023-11-02 DIAGNOSIS — Z87891 Personal history of nicotine dependence: Secondary | ICD-10-CM | POA: Diagnosis not present

## 2023-11-02 DIAGNOSIS — E1122 Type 2 diabetes mellitus with diabetic chronic kidney disease: Secondary | ICD-10-CM | POA: Insufficient documentation

## 2023-11-02 DIAGNOSIS — Z800 Family history of malignant neoplasm of digestive organs: Secondary | ICD-10-CM | POA: Insufficient documentation

## 2023-11-02 DIAGNOSIS — K219 Gastro-esophageal reflux disease without esophagitis: Secondary | ICD-10-CM | POA: Diagnosis not present

## 2023-11-02 DIAGNOSIS — N1831 Chronic kidney disease, stage 3a: Secondary | ICD-10-CM | POA: Diagnosis not present

## 2023-11-02 DIAGNOSIS — Z801 Family history of malignant neoplasm of trachea, bronchus and lung: Secondary | ICD-10-CM | POA: Insufficient documentation

## 2023-11-02 DIAGNOSIS — I129 Hypertensive chronic kidney disease with stage 1 through stage 4 chronic kidney disease, or unspecified chronic kidney disease: Secondary | ICD-10-CM | POA: Diagnosis not present

## 2023-11-02 DIAGNOSIS — I10 Essential (primary) hypertension: Secondary | ICD-10-CM | POA: Diagnosis not present

## 2023-11-02 DIAGNOSIS — C651 Malignant neoplasm of right renal pelvis: Secondary | ICD-10-CM | POA: Diagnosis not present

## 2023-11-02 DIAGNOSIS — C679 Malignant neoplasm of bladder, unspecified: Secondary | ICD-10-CM | POA: Diagnosis not present

## 2023-11-02 DIAGNOSIS — Z806 Family history of leukemia: Secondary | ICD-10-CM | POA: Diagnosis not present

## 2023-11-02 DIAGNOSIS — Z803 Family history of malignant neoplasm of breast: Secondary | ICD-10-CM | POA: Insufficient documentation

## 2023-11-02 DIAGNOSIS — D494 Neoplasm of unspecified behavior of bladder: Secondary | ICD-10-CM | POA: Diagnosis not present

## 2023-11-02 DIAGNOSIS — D090 Carcinoma in situ of bladder: Secondary | ICD-10-CM | POA: Diagnosis not present

## 2023-11-02 HISTORY — PX: CYSTOSCOPY/URETEROSCOPY/HOLMIUM LASER/STENT PLACEMENT: SHX6546

## 2023-11-02 HISTORY — PX: CYSTOSCOPY WITH BIOPSY: SHX5122

## 2023-11-02 LAB — GLUCOSE, CAPILLARY
Glucose-Capillary: 163 mg/dL — ABNORMAL HIGH (ref 70–99)
Glucose-Capillary: 173 mg/dL — ABNORMAL HIGH (ref 70–99)

## 2023-11-02 SURGERY — CYSTOSCOPY/URETEROSCOPY/HOLMIUM LASER/STENT PLACEMENT
Anesthesia: General | Laterality: Right

## 2023-11-02 MED ORDER — INSULIN ASPART 100 UNIT/ML IJ SOLN: 0.0000 [IU] | INTRAMUSCULAR | Status: DC | PRN

## 2023-11-02 MED ORDER — ACETAMINOPHEN 500 MG PO TABS: 1000.0000 mg | ORAL_TABLET | Freq: Once | ORAL | Status: DC

## 2023-11-02 MED ORDER — CIPROFLOXACIN IN D5W 400 MG/200ML IV SOLN
400.0000 mg | Freq: Two times a day (BID) | INTRAVENOUS | Status: DC
  Administered 2023-11-02: 400 mg via INTRAVENOUS
  Filled 2023-11-02: qty 200

## 2023-11-02 MED ORDER — FENTANYL CITRATE (PF) 100 MCG/2ML IJ SOLN
INTRAMUSCULAR | Status: AC
  Filled 2023-11-02: qty 2

## 2023-11-02 MED ORDER — AMISULPRIDE (ANTIEMETIC) 5 MG/2ML IV SOLN: 10.0000 mg | Freq: Once | INTRAVENOUS | Status: DC | PRN

## 2023-11-02 MED ORDER — LIDOCAINE HCL (CARDIAC) PF 100 MG/5ML IV SOSY
PREFILLED_SYRINGE | INTRAVENOUS | Status: DC | PRN
  Administered 2023-11-02: 100 mg via INTRAVENOUS

## 2023-11-02 MED ORDER — CHLORHEXIDINE GLUCONATE 0.12 % MT SOLN
15.0000 mL | Freq: Once | OROMUCOSAL | Status: AC
  Administered 2023-11-02: 15 mL via OROMUCOSAL

## 2023-11-02 MED ORDER — ORAL CARE MOUTH RINSE: 15.0000 mL | Freq: Once | OROMUCOSAL | Status: AC

## 2023-11-02 MED ORDER — DEXAMETHASONE SODIUM PHOSPHATE 10 MG/ML IJ SOLN
INTRAMUSCULAR | Status: AC
  Filled 2023-11-02: qty 1

## 2023-11-02 MED ORDER — SODIUM CHLORIDE 0.9 % IR SOLN
Status: DC | PRN
  Administered 2023-11-02: 6000 mL via INTRAVESICAL

## 2023-11-02 MED ORDER — FENTANYL CITRATE (PF) 250 MCG/5ML IJ SOLN
INTRAMUSCULAR | Status: DC | PRN
  Administered 2023-11-02: 50 ug via INTRAVENOUS

## 2023-11-02 MED ORDER — PHENYLEPHRINE HCL-NACL 20-0.9 MG/250ML-% IV SOLN
INTRAVENOUS | Status: AC
  Filled 2023-11-02: qty 250

## 2023-11-02 MED ORDER — GLYCOPYRROLATE 0.2 MG/ML IJ SOLN
INTRAMUSCULAR | Status: DC | PRN
  Administered 2023-11-02: .2 mg via INTRAVENOUS

## 2023-11-02 MED ORDER — PROPOFOL 10 MG/ML IV BOLUS
INTRAVENOUS | Status: AC
  Filled 2023-11-02: qty 20

## 2023-11-02 MED ORDER — PHENYLEPHRINE HCL-NACL 20-0.9 MG/250ML-% IV SOLN
INTRAVENOUS | Status: DC | PRN
  Administered 2023-11-02: 50 ug/min via INTRAVENOUS
  Administered 2023-11-02: 70 ug/min via INTRAVENOUS

## 2023-11-02 MED ORDER — LACTATED RINGERS IV SOLN: INTRAVENOUS | Status: DC

## 2023-11-02 MED ORDER — PROPOFOL 10 MG/ML IV BOLUS
INTRAVENOUS | Status: DC | PRN
  Administered 2023-11-02: 150 mg via INTRAVENOUS
  Administered 2023-11-02: 50 mg via INTRAVENOUS
  Administered 2023-11-02: 100 mg via INTRAVENOUS

## 2023-11-02 MED ORDER — IOHEXOL 300 MG/ML  SOLN
INTRAMUSCULAR | Status: DC | PRN
  Administered 2023-11-02: 17 mL via URETHRAL

## 2023-11-02 MED ORDER — ONDANSETRON HCL 4 MG/2ML IJ SOLN
INTRAMUSCULAR | Status: DC | PRN
  Administered 2023-11-02: 4 mg via INTRAVENOUS

## 2023-11-02 MED ORDER — FENTANYL CITRATE PF 50 MCG/ML IJ SOSY: 25.0000 ug | PREFILLED_SYRINGE | INTRAMUSCULAR | Status: DC | PRN

## 2023-11-02 MED ORDER — ONDANSETRON HCL 4 MG/2ML IJ SOLN
INTRAMUSCULAR | Status: AC
  Filled 2023-11-02: qty 2

## 2023-11-02 SURGICAL SUPPLY — 24 items
BAG URINE DRAIN 2000ML AR STRL (UROLOGICAL SUPPLIES) IMPLANT
BAG URO CATCHER STRL LF (MISCELLANEOUS) ×2 IMPLANT
BASKET ZERO TIP NITINOL 2.4FR (BASKET) IMPLANT
CATH URETL OPEN 5X70 (CATHETERS) ×2 IMPLANT
CLOTH BEACON ORANGE TIMEOUT ST (SAFETY) ×2 IMPLANT
DRAPE FOOT SWITCH (DRAPES) ×2 IMPLANT
ELECT REM PT RETURN 15FT ADLT (MISCELLANEOUS) ×2 IMPLANT
EXTRACTOR STONE NITINOL NGAGE (UROLOGICAL SUPPLIES) IMPLANT
FIBER LASER MOSES 200 DFL (Laser) IMPLANT
FIBER LASER MOSES 365 DFL (Laser) IMPLANT
GLOVE SURG LX STRL 7.5 STRW (GLOVE) ×2 IMPLANT
GOWN STRL SURGICAL XL XLNG (GOWN DISPOSABLE) ×2 IMPLANT
GUIDEWIRE STR DUAL SENSOR (WIRE) IMPLANT
GUIDEWIRE ZIPWRE .038 STRAIGHT (WIRE) ×2 IMPLANT
KIT TURNOVER KIT A (KITS) IMPLANT
LOOP CUT BIPOLAR 24F LRG (ELECTROSURGICAL) IMPLANT
MANIFOLD NEPTUNE II (INSTRUMENTS) ×2 IMPLANT
PACK CYSTO (CUSTOM PROCEDURE TRAY) ×2 IMPLANT
SHEATH NAVIGATOR HD 11/13X36 (SHEATH) IMPLANT
STENT URET 6FRX26 CONTOUR (STENTS) IMPLANT
SYR TOOMEY IRRIG 70ML (MISCELLANEOUS) IMPLANT
SYRINGE TOOMEY IRRIG 70ML (MISCELLANEOUS) IMPLANT
TUBING CONNECTING 10 (TUBING) ×2 IMPLANT
TUBING UROLOGY SET (TUBING) ×2 IMPLANT

## 2023-11-02 NOTE — Anesthesia Postprocedure Evaluation (Signed)
 Anesthesia Post Note  Patient: Maria Marquez  Procedure(s) Performed: CYSTOSCOPY/RIGHT URETEROSCOPY/RETROGRADE PYELOGRAM/ LASER ABLATION/ (Right) CYSTOSCOPY WITH BIOPSY     Patient location during evaluation: PACU Anesthesia Type: General Level of consciousness: awake and alert Pain management: pain level controlled Vital Signs Assessment: post-procedure vital signs reviewed and stable Respiratory status: spontaneous breathing, nonlabored ventilation, respiratory function stable and patient connected to nasal cannula oxygen Cardiovascular status: blood pressure returned to baseline and stable Postop Assessment: no apparent nausea or vomiting Anesthetic complications: no  No notable events documented.  Last Vitals:  Vitals:   11/02/23 1015 11/02/23 1026  BP: 139/60 128/65  Pulse: 72 80  Resp: 15 16  Temp:  36.5 C  SpO2: 93% 95%    Last Pain:  Vitals:   11/02/23 1026  TempSrc: Oral  PainSc: 0-No pain                 Kennieth Rad

## 2023-11-02 NOTE — H&P (Signed)
 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 female, referred by Dr. Sherron Monday after she was found to have a 2.7 cm solid and enhancing lesion in the proximal aspects of the right ureter on CT during a work-up for gross hematuria. She was also noted to have a 13 mm left renal lesion, likely representing a proteinaceous/hyperdense cyst. She is s/p diagnostic right ureteroscopy on 04/18/20 and was found to have multiple large papillary tumors throughout the right renal pelvis/UPJ. 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 She is s/p gem/cis chemotherapy and radiation to the thorax. Negative cysto/ureteroscopy on 09/16/21. She underwent surveillance cystoscopy/ureteroscopy on 01/27/2022 and was found to have multiple small papillary bladder tumors within the right renal pelvis, right ureter and bladder. All identifiable tumor burden was fulgurated at that time. 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 Follow-up surveillance ureteroscopy in February 2024 showed small mucosal abnormalities in the right distal ureter and renal pelvis that were biopsied and ultimately found to be negative for UCC recurrence.  She is here today for a routine surveillance right ureteroscopy.  She reports no new or bothersome LUTS    Past Medical History:  Diagnosis Date   Anemia associated with chemotherapy    followed by dr Leonides Schanz   Carcinoma of renal pelvis, right [facility]) 03/2020   urologist-- dr Seith Aikey/  oncologist-- dr Leonides Schanz;   dx 08/ 2021 ;  chemo 10/ 2021  to 12/ 2021 and complete laser tumor ablation;  recurrent 05/ 2023   completed 6 mitomyocin gel infusions via nephrostomy tube 08/ 2023   Chronic kidney disease, stage 3a (HCC) 12/31/2019   nephrologist--- dr s. Valentino Nose;  due to  chemotherapy   Chronic rhinitis    w/ upper airway wheezing due to nasal drainage per pulmonogy note (dr Celine Mans),  using stiolto inhaler   Diabetes mellitus type 2, diet-controlled (HCC)    followed by pcp;    (04-20-2023 pt has started take farxiga daily;    per pt check blood sugar multiple times daily w/ Libre,  fasting average 120--125   Dyspnea on exertion    04-20-2023 pt stated sob w/ long distancewalk and stairs but recovers quickly when stop/ rest,  ok with household chores and short walks   Family history of breast cancer 05/04/2021   Family history of ovarian cancer 05/04/2021   Generalized weakness    GERD (gastroesophageal reflux disease)    History of basal cell carcinoma (BCC) excision    per pt in 1970s on nose   History of chemotherapy    06-13-2020  to 12/ 2021  for right renal pelvis carcinoma   History of colon polyps    History of radiation therapy    11-13-2020 to 11-26-2020---   Right Lung- SBRT- Dr. Antony Blackbird   HLD (hyperlipidemia)    Hypertension    followed by pcp   Hypothyroidism, postsurgical 1979   followed by pcp   Leukocytosis    Nocturia    OA (osteoarthritis)    PONV (postoperative nausea and vomiting)    Since chemo, ponv   Port-A-Cath in place    Primary adenocarcinoma of upper lobe of right lung [facility]) 09/2020   oncologist--- dr dorsey/  pulmonology-- dr n. Celine Mans;  dx 02/ 2022;  s/p  SBRT 11-13-2020 to 11-26-2020  Urgency of urination    wears pads   Varicose veins of both legs with edema    Wears partial dentures    lower    Past Surgical History:  Procedure Laterality Date   COLONOSCOPY  last one 2017   CYSTOSCOPY W/ URETERAL STENT PLACEMENT Right 01/27/2022   Procedure: CYSTOSCOPY WITH RETROGRADE PYELOGRAM/ URETEROSCOPY/POSSIBLE BIOPSY/ POSSIBLE  URETERAL STENT PLACEMENT;  Surgeon: Rene Paci, MD;  Location: [facility];  Service: Urology;  Laterality: Right;   CYSTOSCOPY WITH BIOPSY Right 09/16/2021    Procedure: CYSTOSCOPY / URETEROSCOPY WITH BIOPSY/bugbee fulgeration/right retrograde;  Surgeon: Rene Paci, MD;  Location: [facility];  Service: Urology;  Laterality: Right;   CYSTOSCOPY WITH RETROGRADE PYELOGRAM, URETEROSCOPY AND STENT PLACEMENT Right 04/27/2023   Procedure: CYSTOSCOPY, WITH RIGHT RETROGRADE PYELOGRAM, RIGHT URETEROSCOPY, RIGHT URETERAL STENT PLACEMENT, FULGURATION OF RIGHT RENAL PELVIS TUMORS;  Surgeon: Rene Paci, MD;  Location: [facility];  Service: Urology;  Laterality: Right;   CYSTOSCOPY WITH STENT PLACEMENT Right 10/01/2022   Procedure: CYSTOSCOPY WITH STENT PLACEMENT;  Surgeon: Rene Paci, MD;  Location: [facility];  Service: Urology;  Laterality: Right;   CYSTOSCOPY WITH URETEROSCOPY Right 04/18/2020   Procedure: CYSTOSCOPY WITH URETEROSCOPY, BIOPSY;  Surgeon: Rene Paci, MD;  Location: [facility];  Service: Urology;  Laterality: Right;  ONLY NEEDS 45 MIN   CYSTOSCOPY WITH URETEROSCOPY Right 06/02/2022   Procedure: CYSTOSCOPY WITH URETEROSCOPY;  Surgeon: Rene Paci, MD;  Location: [facility];  Service: Urology;  Laterality: Right;  ONLY NEEDS 60 MINS   CYSTOSCOPY WITH URETEROSCOPY AND STENT PLACEMENT N/A 12/31/2020   Procedure: CYSTOSCOPY WITH RIGHT URETEROSCOPY/ BILATERAL RETROGRADE PYELOGRAM/RIGHT URETERAL BIOPSY/ LASER ABLATION/RIGHT STENT PLACEMENT;  Surgeon: Rene Paci, MD;  Location: [facility];  Service: Urology;  Laterality: N/A;   CYSTOSCOPY/RETROGRADE/URETEROSCOPY Right 04/22/2021   Procedure: CYSTOSCOPY/RETROGRADE/URETEROSCOPY/ STENT PLACEMENT;  Surgeon: Rene Paci, MD;  Location: [facility];  Service: Urology;  Laterality: Right;   EYE SURGERY Bilateral 1987   tear ducts   IR IMAGING GUIDED PORT INSERTION  06/04/2020   IR NEPHROSTOMY PLACEMENT RIGHT  03/03/2022   THYROID  LOBECTOMY Right 1979   TOTAL HIP ARTHROPLASTY Right 01/28/2016   Procedure: RIGHT TOTAL HIP ARTHROPLASTY ANTERIOR APPROACH;  Surgeon: Ollen Gross, MD;  Location: [facility];  Service: Orthopedics;  Laterality: Right;   UMBILICAL HERNIA REPAIR  1980s   URETEROSCOPY Right 10/01/2022   Procedure: URETEROSCOPY WITH BIOPSY pylegram;  Surgeon: Rene Paci, MD;  Location: [facility];  Service: Urology;  Laterality: Right;  45 MINS   VIDEO BRONCHOSCOPY WITH ENDOBRONCHIAL NAVIGATION N/A 06/06/2020   Procedure: VIDEO BRONCHOSCOPY WITH ENDOBRONCHIAL NAVIGATION;  Surgeon: Leslye Peer, MD;  Location: [facility];  Service: Thoracic;  Laterality: N/A;   VIDEO BRONCHOSCOPY WITH ENDOBRONCHIAL NAVIGATION N/A 10/10/2020   Procedure: VIDEO BRONCHOSCOPY WITH ENDOBRONCHIAL NAVIGATION;  Surgeon: Loreli Slot, MD;  Location: [facility];  Service: Thoracic;  Laterality: N/A;    Allergies:  Allergies  Allergen Reactions   Iodine Shortness Of Breath and Other (See Comments)   Keflex [Cephalexin] Shortness Of Breath, Rash and Tinitus   Shellfish Allergy Hives   Shellfish-Derived Products Anaphylaxis and Hives   Hydrocodone Hives and Itching    Itching throat   Lasix [Furosemide] Itching   Rosuvastatin Other (See Comments)    Muscle Pain in Thighs   Ciprofloxacin Other (See Comments)   Latex Rash  Lisinopril Cough   Sulfa Antibiotics Nausea Only   Tramadol Itching    Family History  Problem Relation Age of Onset   Arthritis Mother    Breast cancer Mother 39   Schizophrenia Mother    Diabetes Father    Heart disease Father    Kidney disease Father    CAD Father    Stroke Sister    Lung cancer Brother        dx > 50; work-related exposures   Heart disease Brother    Heart disease Brother    Vision loss Brother        Glaucoma   Alcohol abuse Brother    Ovarian cancer Daughter 8   Lung cancer Maternal Aunt        dx > 50   Colon cancer Paternal Aunt        dx > 50    Head & neck cancer Paternal Uncle        dx 39s   Leukemia Cousin        d. 28s   Breast cancer Niece 61    Social History:  reports that she quit smoking about 3 years ago. Her smoking use included cigarettes. She started smoking about 48 years ago. She has a 45 pack-year smoking history. She has never used smokeless tobacco. She reports that she does not currently use alcohol. She reports that she does not use drugs.  ROS: A complete review of systems was performed.  All systems are negative except for pertinent findings as noted.  Physical Exam:  Vital signs in last 24 hours: Temp:  [98.5 F (36.9 C)] (P) 98.5 F (36.9 C) (03/12 0640) Pulse Rate:  [80] (P) 80 (03/12 0640) Resp:  [18] (P) 18 (03/12 0640) BP: (P) 109/95 (03/12 0640) SpO2:  [97 %] (P) 97 % (03/12 0640) Weight:  [125.2 kg] 125.2 kg (03/12 0644) Constitutional:  Alert and oriented, No acute distress Cardiovascular: Regular rate and rhythm, No JVD Respiratory: Normal respiratory effort, Lungs clear bilaterally GI: Abdomen is soft, nontender, nondistended, no abdominal masses GU: No CVA tenderness Lymphatic: No lymphadenopathy Neurologic: Grossly intact, no focal deficits Psychiatric: Normal mood and affect  Laboratory Data:  Recent Labs    10/31/23 1045  WBC 9.4  HGB 13.8  HCT 43.2  PLT 249    Recent Labs    10/31/23 1045  NA 136  K 3.7  CL 101  GLUCOSE 152*  BUN 36*  CALCIUM 8.8*  CREATININE 1.60*     Results for orders placed or performed during the hospital encounter of 11/02/23 (from the past 24 hours)  Glucose, capillary     Status: Abnormal   Collection Time: 11/02/23  6:53 AM  Result Value Ref Range   Glucose-Capillary 163 (H) 70 - 99 mg/dL   Comment 1 Notify RN    No results found for this or any previous visit (from the past 240 hours).  Renal Function: Recent Labs    10/31/23 1045  CREATININE 1.60*   Estimated Creatinine Clearance: 41.1 mL/min (A) (by C-G formula based on  SCr of 1.6 mg/dL (H)).  Radiologic Imaging: No results found.  I independently reviewed the above imaging studies.  Assessment and Plan Maria Marquez is a 78 y.o. female with a history of recurrent UCC of the right renal pelvis   -The R/B/A of cystoscopy with right ureteroscopy and possible biopsy was discussed.  She voices understanding and wishes to proceed.   Rhoderick Moody, MD 11/02/2023,  8:35 AM  [facility] Pager: [phone]

## 2023-11-02 NOTE — Op Note (Signed)
 Operative Note  Preoperative diagnosis:  1.  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  Cystoscopy with bladder biopsy and fulguration 2.  Right ureteroscopy with laser ablation of right renal pelvis tumors 3.  Bilateral retrograde pyelograms with intraoperative interpretation of fluoroscopic imaging  Surgeon: Rhoderick Moody, MD  Assistants:  None  Anesthesia:  General  Complications:  None  EBL: Less than 5 mL  Specimens: 1.  Right renal pelvis washings 2.  Posterior bladder wall biopsies  Drains/Catheters: 1.  None  Intraoperative findings:   2 mm papillary bladder tumors were seen involving the posterior bladder wall Solitary right collecting system with no filling defects or dilation involving the right ureter or right renal pelvis seen on retrograde pyelogram Solitary left collecting system with no filling defects or dilation involving the left ureter or left renal pelvis seen on retrograde pyelogram Two 1 to 2 mm papillary bladder tumors were seen within the right renal pelvis No right ureteral mucosal lesions were seen during ureteroscopy  Indication:  Maria Marquez is a 78 y.o. female with a history of recurrent low-grade TA urothelial carcinoma of the right renal pelvis and bladder.  She is here today for surveillance cystoscopy and ureteroscopy.  She has been consented for the above procedures, voices understanding and wishes to proceed.  Description of procedure:  After informed consent was obtained, the patient was brought to the operating room and general LMA anesthesia was administered. The patient was then placed in the dorsolithotomy position and prepped and draped in the usual sterile fashion. A timeout was performed. A 23 French rigid cystoscope was then inserted into the urethral meatus and  advanced into the bladder under direct vision. A complete bladder survey revealed the findings listed above.  A 5 French ureteral catheter was then inserted into the right ureteral orifice and a retrograde pyelogram was obtained, with the findings listed above.  A Glidewire was then used to intubate the lumen of the ureteral catheter and was advanced up to the right renal pelvis, under fluoroscopic guidance.  The catheter was then removed, leaving the wire in place.  A flexible ureteroscope was then advanced alongside the wire and up to the right renal pelvis.  A full inspection of the right renal pelvis revealed two 1 to 2 mm papillary bladder tumors in the upper portion of the renal pelvis.  These lesions were too small to biopsy so right renal pelvis washings were obtained.  A 200 m holmium laser was then used to extensively ablate the tumors, achieving excellent hemostasis following ablation.  Flexible ureteroscope was then removed under direct vision, identifying no luminal abnormalities within the right ureter.  Due to the atraumatic nature of her ureteroscopy, made decision not to place a ureteral stent.  The Glidewire was then removed.  The rigid cystoscope was then reinserted and the papillary bladder tumors involving the posterior bladder wall were resected using cold cup biopsy forceps.  The areas that were biopsied were then extensively fulgurated until hemostasis was achieved.  The tissue specimens were sent to pathology for permanent section.  A 5 French ureteral catheter was then inserted into the left ureteral orifice and a retrograde pyelogram was obtained, with the findings listed above.  The patient's bladder was drained.  She tolerated the procedure well and was transferred to the postanesthesia in stable condition.  Plan: Follow-up on 11/10/2023  to discuss pathology results

## 2023-11-02 NOTE — Anesthesia Procedure Notes (Signed)
 Procedure Name: LMA Insertion Date/Time: 11/02/2023 8:54 AM  Performed by: Ponciano Ort, CRNAPre-anesthesia Checklist: Patient identified, Emergency Drugs available, Suction available and Patient being monitored Patient Re-evaluated:Patient Re-evaluated prior to induction Oxygen Delivery Method: Circle system utilized Preoxygenation: Pre-oxygenation with 100% oxygen Induction Type: IV induction Ventilation: Mask ventilation without difficulty and Two handed mask ventilation required LMA: LMA inserted LMA Size: 4.0 Tube type: Oral Number of attempts: 2 Placement Confirmation: ETT inserted through vocal cords under direct vision, positive ETCO2 and breath sounds checked- equal and bilateral Tube secured with: Tape Dental Injury: Teeth and Oropharynx as per pre-operative assessment

## 2023-11-02 NOTE — Anesthesia Preprocedure Evaluation (Signed)
 Anesthesia Evaluation  Patient identified by MRN, date of birth, ID band Patient awake    Reviewed: Allergy & Precautions, H&P , NPO status , Patient's Chart, lab work & pertinent test results  History of Anesthesia Complications (+) PONV and history of anesthetic complications  Airway Mallampati: II   Neck ROM: full    Dental   Pulmonary shortness of breath, former smoker   breath sounds clear to auscultation       Cardiovascular hypertension,  Rhythm:regular Rate:Normal     Neuro/Psych  Headaches PSYCHIATRIC DISORDERS  Depression       GI/Hepatic ,GERD  ,,  Endo/Other  diabetes, Type 2Hypothyroidism  Class 3 obesity  Renal/GU Renal InsufficiencyRenal diseasestones     Musculoskeletal  (+) Arthritis ,    Abdominal   Peds  Hematology  (+) Blood dyscrasia, anemia   Anesthesia Other Findings   Reproductive/Obstetrics                             Anesthesia Physical Anesthesia Plan  ASA: 3  Anesthesia Plan: General   Post-op Pain Management: Tylenol PO (pre-op)*   Induction: Intravenous  PONV Risk Score and Plan: 4 or greater and Ondansetron, Dexamethasone, Treatment may vary due to age or medical condition and Midazolam  Airway Management Planned: LMA  Additional Equipment:   Intra-op Plan:   Post-operative Plan: Extubation in OR  Informed Consent: I have reviewed the patients History and Physical, chart, labs and discussed the procedure including the risks, benefits and alternatives for the proposed anesthesia with the patient or authorized representative who has indicated his/her understanding and acceptance.     Dental advisory given  Plan Discussed with: CRNA, Anesthesiologist and Surgeon  Anesthesia Plan Comments:        Anesthesia Quick Evaluation

## 2023-11-02 NOTE — Transfer of Care (Signed)
 Immediate Anesthesia Transfer of Care Note  Patient: Maria Marquez  Procedure(s) Performed: CYSTOSCOPY/RIGHT URETEROSCOPY/RETROGRADE PYELOGRAM/ LASER ABLATION/ (Right) CYSTOSCOPY WITH BIOPSY  Patient Location: PACU  Anesthesia Type:General  Level of Consciousness: awake, alert , oriented, and patient cooperative  Airway & Oxygen Therapy: Patient Spontanous Breathing and Patient connected to face mask oxygen  Post-op Assessment: Report given to RN and Post -op Vital signs reviewed and stable  Post vital signs: Reviewed and stable  Last Vitals:  Vitals Value Taken Time  BP 161/73 11/02/23 0946  Temp    Pulse 80 11/02/23 0948  Resp 21 11/02/23 0948  SpO2 98 % 11/02/23 0948  Vitals shown include unfiled device data.  Last Pain:  Vitals:   11/02/23 0640  TempSrc: (P) Oral         Complications: No notable events documented.

## 2023-11-03 ENCOUNTER — Encounter (HOSPITAL_COMMUNITY): Payer: Self-pay | Admitting: Urology

## 2023-11-03 LAB — SURGICAL PATHOLOGY

## 2023-11-04 LAB — CYTOLOGY - NON PAP

## 2023-11-07 ENCOUNTER — Ambulatory Visit: Payer: Medicare HMO | Admitting: Podiatry

## 2023-11-09 DIAGNOSIS — I129 Hypertensive chronic kidney disease with stage 1 through stage 4 chronic kidney disease, or unspecified chronic kidney disease: Secondary | ICD-10-CM | POA: Diagnosis not present

## 2023-11-09 DIAGNOSIS — R609 Edema, unspecified: Secondary | ICD-10-CM | POA: Diagnosis not present

## 2023-11-09 DIAGNOSIS — E1122 Type 2 diabetes mellitus with diabetic chronic kidney disease: Secondary | ICD-10-CM | POA: Diagnosis not present

## 2023-11-09 DIAGNOSIS — D631 Anemia in chronic kidney disease: Secondary | ICD-10-CM | POA: Diagnosis not present

## 2023-11-09 DIAGNOSIS — N1832 Chronic kidney disease, stage 3b: Secondary | ICD-10-CM | POA: Diagnosis not present

## 2023-11-09 DIAGNOSIS — C689 Malignant neoplasm of urinary organ, unspecified: Secondary | ICD-10-CM | POA: Diagnosis not present

## 2023-11-09 LAB — LAB REPORT - SCANNED: EGFR: 25

## 2023-11-10 DIAGNOSIS — C651 Malignant neoplasm of right renal pelvis: Secondary | ICD-10-CM | POA: Diagnosis not present

## 2023-11-10 DIAGNOSIS — C670 Malignant neoplasm of trigone of bladder: Secondary | ICD-10-CM | POA: Diagnosis not present

## 2023-11-13 ENCOUNTER — Other Ambulatory Visit: Payer: Self-pay | Admitting: Internal Medicine

## 2023-11-15 ENCOUNTER — Other Ambulatory Visit: Payer: Self-pay | Admitting: Internal Medicine

## 2023-11-21 ENCOUNTER — Ambulatory Visit (INDEPENDENT_AMBULATORY_CARE_PROVIDER_SITE_OTHER): Admitting: Podiatry

## 2023-11-21 DIAGNOSIS — M79675 Pain in left toe(s): Secondary | ICD-10-CM

## 2023-11-21 DIAGNOSIS — M79674 Pain in right toe(s): Secondary | ICD-10-CM

## 2023-11-21 DIAGNOSIS — M79673 Pain in unspecified foot: Secondary | ICD-10-CM

## 2023-11-21 DIAGNOSIS — B351 Tinea unguium: Secondary | ICD-10-CM | POA: Diagnosis not present

## 2023-11-21 NOTE — Progress Notes (Signed)
 Subjective:  Patient ID: Maria Marquez, female    DOB: [birth year],  MRN: [id number]   Maria Marquez presents to clinic today for:  Chief Complaint  Patient presents with   Nail Problem    Nail trim   . Patient notes nails are thick, discolored, elongated and painful in shoegear when trying to ambulate.   PCP is Philip Aspen, Limmie Patricia, MD.  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 Valentino Nose;  due to chemotherapy   Chronic rhinitis    w/ upper airway wheezing due to nasal drainage per pulmonogy note (dr Celine Mans),  using stiolto inhaler   Diabetes mellitus type 2, diet-controlled (HCC)    followed by pcp;    (04-20-2023 pt has started take farxiga daily;    per pt check blood sugar multiple times daily w/ Libre,  fasting average 120--125   Dyspnea on exertion    04-20-2023 pt stated sob w/ long distancewalk and stairs but recovers quickly when stop/ rest,  ok with household chores and short walks   Family history of breast cancer 05/04/2021   Family history of ovarian cancer 05/04/2021   Generalized weakness    GERD (gastroesophageal reflux disease)    History of basal cell carcinoma (BCC) excision    per pt in 1970s on nose   History of chemotherapy    06-13-2020  to 12/ 2021  for right renal pelvis carcinoma   History of colon polyps    History of radiation therapy    11-13-2020 to 11-26-2020---   Right Lung- SBRT- Dr. Antony Blackbird   HLD (hyperlipidemia)    Hypertension    followed by pcp   Hypothyroidism, postsurgical 1979   followed by pcp   Leukocytosis    Nocturia     OA (osteoarthritis)    PONV (postoperative nausea and vomiting)    Since chemo, ponv   Port-A-Cath in place    Primary adenocarcinoma of upper lobe of right lung [facility]) 09/2020   oncologist--- dr dorsey/  pulmonology-- dr n. Celine Mans;  dx 02/ 2022;  s/p  SBRT 11-13-2020 to 11-26-2020   Urgency of urination    wears pads   Varicose veins of both legs with edema    Wears partial dentures    lower    Past Surgical History:  Procedure Laterality Date   COLONOSCOPY  last one 2017   CYSTOSCOPY W/ URETERAL STENT PLACEMENT Right 01/27/2022   Procedure: CYSTOSCOPY WITH RETROGRADE PYELOGRAM/ URETEROSCOPY/POSSIBLE BIOPSY/ POSSIBLE  URETERAL STENT PLACEMENT;  Surgeon: Rene Paci, MD;  Location: [facility];  Service: Urology;  Laterality: Right;   CYSTOSCOPY WITH BIOPSY Right 09/16/2021   Procedure: CYSTOSCOPY / URETEROSCOPY WITH BIOPSY/bugbee fulgeration/right retrograde;  Surgeon: Rene Paci, MD;  Location: [facility];  Service: Urology;  Laterality: Right;   CYSTOSCOPY WITH BIOPSY N/A 11/02/2023   Procedure: CYSTOSCOPY WITH BIOPSY;  Surgeon: Rene Paci, MD;  Location: [facility];  Service: Urology;  Laterality: N/A;   CYSTOSCOPY WITH RETROGRADE PYELOGRAM, URETEROSCOPY  AND STENT PLACEMENT Right 04/27/2023   Procedure: CYSTOSCOPY, WITH RIGHT RETROGRADE PYELOGRAM, RIGHT URETEROSCOPY, RIGHT URETERAL STENT PLACEMENT, FULGURATION OF RIGHT RENAL PELVIS TUMORS;  Surgeon: Rene Paci, MD;  Location: [facility];  Service: Urology;  Laterality: Right;   CYSTOSCOPY WITH STENT PLACEMENT Right 10/01/2022   Procedure: CYSTOSCOPY WITH STENT PLACEMENT;  Surgeon: Rene Paci, MD;  Location: [facility];  Service: Urology;  Laterality: Right;   CYSTOSCOPY WITH URETEROSCOPY Right 04/18/2020   Procedure: CYSTOSCOPY WITH URETEROSCOPY, BIOPSY;  Surgeon: Rene Paci, MD;  Location: [facility];   Service: Urology;  Laterality: Right;  ONLY NEEDS 45 MIN   CYSTOSCOPY WITH URETEROSCOPY Right 06/02/2022   Procedure: CYSTOSCOPY WITH URETEROSCOPY;  Surgeon: Rene Paci, MD;  Location: [facility];  Service: Urology;  Laterality: Right;  ONLY NEEDS 60 MINS   CYSTOSCOPY WITH URETEROSCOPY AND STENT PLACEMENT N/A 12/31/2020   Procedure: CYSTOSCOPY WITH RIGHT URETEROSCOPY/ BILATERAL RETROGRADE PYELOGRAM/RIGHT URETERAL BIOPSY/ LASER ABLATION/RIGHT STENT PLACEMENT;  Surgeon: Rene Paci, MD;  Location: [facility];  Service: Urology;  Laterality: N/A;   CYSTOSCOPY/RETROGRADE/URETEROSCOPY Right 04/22/2021   Procedure: CYSTOSCOPY/RETROGRADE/URETEROSCOPY/ STENT PLACEMENT;  Surgeon: Rene Paci, MD;  Location: [facility];  Service: Urology;  Laterality: Right;   CYSTOSCOPY/URETEROSCOPY/HOLMIUM LASER/STENT PLACEMENT Right 11/02/2023   Procedure: CYSTOSCOPY/RIGHT URETEROSCOPY/RETROGRADE PYELOGRAM/ LASER ABLATION/;  Surgeon: Rene Paci, MD;  Location: [facility];  Service: Urology;  Laterality: Right;  45 MINUTES NEEDED   EYE SURGERY Bilateral 1987   tear ducts   IR IMAGING GUIDED PORT INSERTION  06/04/2020   IR NEPHROSTOMY PLACEMENT RIGHT  03/03/2022   THYROID LOBECTOMY Right 1979   TOTAL HIP ARTHROPLASTY Right 01/28/2016   Procedure: RIGHT TOTAL HIP ARTHROPLASTY ANTERIOR APPROACH;  Surgeon: Ollen Gross, MD;  Location: [facility];  Service: Orthopedics;  Laterality: Right;   UMBILICAL HERNIA REPAIR  1980s   URETEROSCOPY Right 10/01/2022   Procedure: URETEROSCOPY WITH BIOPSY pylegram;  Surgeon: Rene Paci, MD;  Location: [facility];  Service: Urology;  Laterality: Right;  45 MINS   VIDEO BRONCHOSCOPY WITH ENDOBRONCHIAL NAVIGATION N/A 06/06/2020   Procedure: VIDEO BRONCHOSCOPY WITH ENDOBRONCHIAL NAVIGATION;  Surgeon: Leslye Peer, MD;  Location: [facility];  Service: Thoracic;  Laterality:  N/A;   VIDEO BRONCHOSCOPY WITH ENDOBRONCHIAL NAVIGATION N/A 10/10/2020   Procedure: VIDEO BRONCHOSCOPY WITH ENDOBRONCHIAL NAVIGATION;  Surgeon: Loreli Slot, MD;  Location: [facility];  Service: Thoracic;  Laterality: N/A;    Allergies  Allergen Reactions   Iodine Shortness Of Breath and Other (See Comments)   Keflex [Cephalexin] Shortness Of Breath, Rash and Tinitus   Shellfish Allergy Hives   Shellfish-Derived Products Anaphylaxis and Hives   Hydrocodone Hives and Itching    Itching throat   Lasix [Furosemide] Itching   Rosuvastatin Other (See Comments)    Muscle Pain in Thighs   Ciprofloxacin Other (See Comments)   Latex Rash   Lisinopril Cough   Sulfa Antibiotics Nausea Only   Tramadol Itching    Review of Systems: Negative except as noted in the HPI.  Objective:  Maria Marquez is a pleasant 78 y.o. female in NAD. AAO x 3.  Vascular Examination: Capillary refill time is 3-5 seconds to toes bilateral. Palpable pedal pulses b/l LE. Digital hair present b/l.  Skin temperature gradient WNL b/l. No varicosities b/l. No cyanosis noted b/l.   Dermatological Examination: Pedal skin with normal turgor, texture and tone b/l. No open wounds. No interdigital  macerations b/l. Toenails x10 are 3mm thick, discolored, dystrophic with subungual debris. There is pain with compression of the nail plates.  They are elongated x10     Latest Ref Rng & Units 10/31/2023   10:45 AM  Hemoglobin A1C  Hemoglobin-A1c 4.8 - 5.6 % 7.2    Assessment/Plan: 1. Pain due to onychomycosis of toenails of both feet     The mycotic toenails were sharply debrided x10 with sterile nail nippers and a power debriding burr to decrease bulk/thickness and length.     Return in about 3 months (around 02/20/2024) for [facility]   Clerance Lav, DPM, FACFAS [facility]     [address] [place], [place] [address]                Office [phone]  Fax  [phone]

## 2023-12-01 ENCOUNTER — Encounter: Payer: Self-pay | Admitting: Internal Medicine

## 2023-12-01 ENCOUNTER — Other Ambulatory Visit: Payer: Self-pay | Admitting: Internal Medicine

## 2023-12-01 DIAGNOSIS — D631 Anemia in chronic kidney disease: Secondary | ICD-10-CM | POA: Diagnosis not present

## 2023-12-01 DIAGNOSIS — R609 Edema, unspecified: Secondary | ICD-10-CM | POA: Diagnosis not present

## 2023-12-01 DIAGNOSIS — E1122 Type 2 diabetes mellitus with diabetic chronic kidney disease: Secondary | ICD-10-CM | POA: Diagnosis not present

## 2023-12-01 DIAGNOSIS — I129 Hypertensive chronic kidney disease with stage 1 through stage 4 chronic kidney disease, or unspecified chronic kidney disease: Secondary | ICD-10-CM | POA: Diagnosis not present

## 2023-12-01 DIAGNOSIS — N1832 Chronic kidney disease, stage 3b: Secondary | ICD-10-CM | POA: Diagnosis not present

## 2023-12-01 DIAGNOSIS — K219 Gastro-esophageal reflux disease without esophagitis: Secondary | ICD-10-CM

## 2023-12-01 DIAGNOSIS — C689 Malignant neoplasm of urinary organ, unspecified: Secondary | ICD-10-CM | POA: Diagnosis not present

## 2023-12-02 ENCOUNTER — Inpatient Hospital Stay: Payer: Medicare HMO | Attending: Oncology

## 2023-12-02 DIAGNOSIS — Z85118 Personal history of other malignant neoplasm of bronchus and lung: Secondary | ICD-10-CM | POA: Insufficient documentation

## 2023-12-02 DIAGNOSIS — Z452 Encounter for adjustment and management of vascular access device: Secondary | ICD-10-CM | POA: Insufficient documentation

## 2023-12-02 DIAGNOSIS — Z8553 Personal history of malignant neoplasm of renal pelvis: Secondary | ICD-10-CM | POA: Diagnosis not present

## 2023-12-02 DIAGNOSIS — Z95828 Presence of other vascular implants and grafts: Secondary | ICD-10-CM

## 2023-12-02 MED ORDER — SODIUM CHLORIDE 0.9% FLUSH
10.0000 mL | Freq: Once | INTRAVENOUS | Status: AC
Start: 2023-12-02 — End: 2023-12-02
  Administered 2023-12-02: 10 mL

## 2023-12-02 MED ORDER — HEPARIN SOD (PORK) LOCK FLUSH 100 UNIT/ML IV SOLN
250.0000 [IU] | Freq: Once | INTRAVENOUS | Status: AC
  Administered 2023-12-02: 250 [IU]

## 2023-12-14 ENCOUNTER — Other Ambulatory Visit: Payer: Self-pay | Admitting: Internal Medicine

## 2023-12-19 DIAGNOSIS — I129 Hypertensive chronic kidney disease with stage 1 through stage 4 chronic kidney disease, or unspecified chronic kidney disease: Secondary | ICD-10-CM | POA: Diagnosis not present

## 2023-12-19 DIAGNOSIS — C689 Malignant neoplasm of urinary organ, unspecified: Secondary | ICD-10-CM | POA: Diagnosis not present

## 2023-12-19 DIAGNOSIS — R609 Edema, unspecified: Secondary | ICD-10-CM | POA: Diagnosis not present

## 2023-12-19 DIAGNOSIS — D631 Anemia in chronic kidney disease: Secondary | ICD-10-CM | POA: Diagnosis not present

## 2023-12-19 DIAGNOSIS — N1832 Chronic kidney disease, stage 3b: Secondary | ICD-10-CM | POA: Diagnosis not present

## 2023-12-19 LAB — CBC AND DIFFERENTIAL
HCT: 41 (ref 36–46)
Hemoglobin: 12.8 (ref 12.0–16.0)

## 2023-12-19 LAB — COMPREHENSIVE METABOLIC PANEL WITH GFR
Calcium: 9.1 (ref 8.7–10.7)
eGFR: 19

## 2023-12-19 LAB — BASIC METABOLIC PANEL WITH GFR
BUN: 31 — AB (ref 4–21)
CO2: 23 — AB (ref 13–22)
Chloride: 99 (ref 99–108)
Creatinine: 1.6 — AB (ref 0.5–1.1)
Glucose: 166
Potassium: 3.9 meq/L (ref 3.5–5.1)
Sodium: 121 — AB (ref 137–147)

## 2023-12-19 LAB — VITAMIN D 25 HYDROXY (VIT D DEFICIENCY, FRACTURES): Vit D, 25-Hydroxy: 26.4

## 2023-12-21 ENCOUNTER — Ambulatory Visit (HOSPITAL_COMMUNITY)
Admission: RE | Admit: 2023-12-21 | Discharge: 2023-12-21 | Disposition: A | Discharge status: Home / Self Care | Payer: Medicare HMO | Source: Ambulatory Visit | Attending: Hematology and Oncology | Admitting: Hematology and Oncology

## 2023-12-21 DIAGNOSIS — C3490 Malignant neoplasm of unspecified part of unspecified bronchus or lung: Secondary | ICD-10-CM | POA: Diagnosis not present

## 2023-12-21 DIAGNOSIS — J439 Emphysema, unspecified: Secondary | ICD-10-CM | POA: Diagnosis not present

## 2023-12-21 DIAGNOSIS — I700 Atherosclerosis of aorta: Secondary | ICD-10-CM | POA: Diagnosis not present

## 2023-12-26 ENCOUNTER — Telehealth: Payer: Self-pay | Admitting: *Deleted

## 2023-12-26 NOTE — Telephone Encounter (Signed)
-----   Message from Darilyn Edin sent at 12/26/2023 12:22 PM EDT ----- Please notify patient that lung nodule that was treated with radiation therapy is stable in size. Other small lung nodules are stable in size. No evidence of progression seen. ----- Message ----- From: Interface, Rad Results In Sent: 12/26/2023   1:38 AM EDT To: Ander Bame, MD

## 2023-12-26 NOTE — Telephone Encounter (Signed)
 TCT patient regarding recent scan results. Spoke with her. Advised that the lung nodule that was treated with radiation therapy is stable in size. Other small lung nodules are stable in size. No evidence of progression seen. Pt voiced understanding.

## 2023-12-29 ENCOUNTER — Other Ambulatory Visit: Payer: Self-pay | Admitting: Internal Medicine

## 2023-12-29 DIAGNOSIS — Z79890 Hormone replacement therapy (postmenopausal): Secondary | ICD-10-CM

## 2024-01-04 ENCOUNTER — Other Ambulatory Visit: Payer: Self-pay | Admitting: Internal Medicine

## 2024-01-05 ENCOUNTER — Encounter: Payer: Self-pay | Admitting: Internal Medicine

## 2024-01-05 LAB — LAB REPORT - SCANNED: PTH, Intact: 88

## 2024-01-06 ENCOUNTER — Encounter

## 2024-01-13 ENCOUNTER — Inpatient Hospital Stay: Payer: Medicare HMO

## 2024-01-19 DIAGNOSIS — H0102A Squamous blepharitis right eye, upper and lower eyelids: Secondary | ICD-10-CM | POA: Diagnosis not present

## 2024-01-19 DIAGNOSIS — H16223 Keratoconjunctivitis sicca, not specified as Sjogren's, bilateral: Secondary | ICD-10-CM | POA: Diagnosis not present

## 2024-01-19 DIAGNOSIS — H0102B Squamous blepharitis left eye, upper and lower eyelids: Secondary | ICD-10-CM | POA: Diagnosis not present

## 2024-01-20 ENCOUNTER — Encounter: Admitting: Internal Medicine

## 2024-01-25 ENCOUNTER — Ambulatory Visit (INDEPENDENT_AMBULATORY_CARE_PROVIDER_SITE_OTHER)

## 2024-01-25 VITALS — BP 118/62 | HR 76 | Temp 98.1°F | Ht 65.0 in | Wt 279.5 lb

## 2024-01-25 DIAGNOSIS — Z0000 Encounter for general adult medical examination without abnormal findings: Secondary | ICD-10-CM | POA: Diagnosis not present

## 2024-01-25 NOTE — Patient Instructions (Addendum)
 Ms. Maria Marquez , Thank you for taking time out of your busy schedule to complete your Annual Wellness Visit with me. I enjoyed our conversation and look forward to speaking with you again next year. I, as well as your care team,  appreciate your ongoing commitment to your health goals. Please review the following plan we discussed and let me know if I can assist you in the future. Your Game plan/ To Do List    Referrals: If you haven't heard from the office you've been referred to, please reach out to them at the phone provided.   Follow up Visits: Next Medicare AWV with our clinical staff: 01/30/25 @ 3p   Have you seen your provider in the last 6 months (3 months if uncontrolled diabetes)?  Next Office Visit with your provider:   Clinician Recommendations:  Aim for 30 minutes of exercise or brisk walking, 6-8 glasses of water , and 5 servings of fruits and vegetables each day.       This is a list of the screening recommended for you and due dates:  Health Maintenance  Topic Date Due   Colon Cancer Screening  12/22/2020   COVID-19 Vaccine (5 - 2024-25 season) 04/24/2023   Flu Shot  03/23/2024   Hemoglobin A1C  05/02/2024   DTaP/Tdap/Td vaccine (2 - Td or Tdap) 06/16/2024   Yearly kidney health urinalysis for diabetes  07/15/2024   Mammogram  08/30/2024   Complete foot exam   10/21/2024   Yearly kidney function blood test for diabetes  12/18/2024   Eye exam for diabetics  12/21/2024   Medicare Annual Wellness Visit  01/24/2025   DEXA scan (bone density measurement)  05/12/2026   Pneumonia Vaccine  Completed   Hepatitis C Screening  Completed   Zoster (Shingles) Vaccine  Completed   HPV Vaccine  Aged Out   Meningitis B Vaccine  Aged Out    Advanced directives: (In Chart) A copy of your advanced directives are scanned into your chart should your provider ever need it. Advance Care Planning is important because it:  [x]  Makes sure you receive the medical care that is consistent with  your values, goals, and preferences  [x]  It provides guidance to your family and loved ones and reduces their decisional burden about whether or not they are making the right decisions based on your wishes.  Follow the link provided in your after visit summary or read over the paperwork we have mailed to you to help you started getting your Advance Directives in place. If you need assistance in completing these, please reach out to us  so that we can help you!  See attachments for Preventive Care and Fall Prevention Tips.

## 2024-01-25 NOTE — Progress Notes (Signed)
 Subjective:   Maria Marquez is a 78 y.o. who presents for a Medicare Wellness preventive visit.  As a reminder, Annual Wellness Visits don't include a physical exam, and some assessments may be limited, especially if this visit is performed virtually. We may recommend an in-person follow-up visit with your provider if needed.  Visit Complete: In person    Persons Participating in Visit: Patient.  AWV Questionnaire: Yes: Patient Medicare AWV questionnaire was completed by the patient on 01/22/24; I have confirmed that all information answered by patient is correct and no changes since this date.  Cardiac Risk Factors include: advanced age (>73men, >9 women);diabetes mellitus;hypertension     Objective:     Today's Vitals   01/25/24 1507  BP: 118/62  Pulse: 76  Temp: 98.1 F (36.7 C)  TempSrc: Oral  SpO2: 96%  Weight: 279 lb 8 oz (126.8 kg)  Height: 5\' 5"  (1.651 m)   Body mass index is 46.51 kg/m.     01/25/2024    3:35 PM 11/02/2023    7:06 AM 10/31/2023   10:22 AM 10/14/2023    3:55 PM 04/27/2023    8:00 AM 10/01/2022   10:57 AM 06/02/2022    9:10 AM  Advanced Directives  Does Patient Have a Medical Advance Directive? Yes Yes Yes Yes Yes Yes Yes  Type of Estate agent of [place];Living will Healthcare Power of [place];Living will Healthcare Power of [place];Living will Healthcare Power of [place];Living will Healthcare Power of [place];Living will Healthcare Power of [place];Living will   Does patient want to make changes to medical advance directive? No - Patient declined No - Patient declined No - Patient declined No - Patient declined No - Patient declined No - Patient declined No - Patient declined  Copy of Healthcare Power of Attorney in Chart? Yes - validated most recent copy scanned in chart (See row information) No - copy requested  Yes - validated most recent copy scanned in chart (See row information) No - copy requested No - copy  requested     Current Medications (verified) Outpatient Encounter Medications as of 01/25/2024  Medication Sig   Semaglutide,0.25 or 0.5MG /DOS, 2 MG/1.5ML SOPN Inject 0.25 mLs into the skin once a week.   acetaminophen  (TYLENOL ) 500 MG tablet Take 1,500 mg by mouth every 6 (six) hours as needed for moderate pain (pain score 4-6).   albuterol  (VENTOLIN  HFA) 108 (90 Base) MCG/ACT inhaler INHALE ONE OR TWO PUFFS BY MOUTH INTO THE LUNGS EVERY SIX HOURS AS NEEDED FOR WHEEZING OR SHORTNESS OF BREATH   atorvastatin  (LIPITOR) 20 MG tablet TAKE 1 TABLET EVERY DAY   Continuous Blood Gluc Receiver (FREESTYLE LIBRE 3 READER) DEVI 1 each by Does not apply route daily.   Continuous Glucose Sensor (FREESTYLE LIBRE 3 SENSOR) MISC PLACE ONE SENSOR ONTO THE SKIN ONCE EVERY 14 DAYS; *CHECK GLUCOSE CONTINUOUSLY* REMOVE OLD SENSOR   dapagliflozin propanediol (FARXIGA) 10 MG TABS tablet Take 10 mg by mouth daily.   EPINEPHRINE  0.3 mg/0.3 mL IJ SOAJ injection INJECT 0.3MG  INTO THE MUSCLE AS NEEDED FOR ANAPHYLAXIS   estradiol -norethindrone  (ACTIVELLA) 1-0.5 MG tablet TAKE ONE TABLET BY MOUTH ONCE A DAY   ezetimibe  (ZETIA ) 10 MG tablet Take 1 tablet (10 mg total) by mouth at bedtime.   fluticasone  (FLONASE ) 50 MCG/ACT nasal spray Place 1 spray into both nostrils daily. (Patient taking differently: Place 1 spray into both nostrils daily as needed.)   furosemide  (LASIX ) 40 MG tablet Take 40 mg by mouth 2 (  two) times daily.   levothyroxine  (SYNTHROID ) 150 MCG tablet TAKE 1 TABLET EVERY DAY (NEED MD APPOINTMENT)   lidocaine -prilocaine  (EMLA ) cream Apply 1 application. topically as needed.   losartan -hydrochlorothiazide  (HYZAAR) 100-25 MG tablet Take 1 tablet by mouth daily.   Multiple Vitamins-Minerals (CENTRUM SILVER 50+WOMEN) TABS Take 2 tablets by mouth daily. (Patient not taking: Reported on 10/27/2023)   oxybutynin  (DITROPAN ) 5 MG tablet Take 1 tablet (5 mg total) by mouth every 8 (eight) hours as needed for bladder  spasms.   pantoprazole  (PROTONIX ) 40 MG tablet TAKE 1 TABLET BY MOUTH EVERY DAY   phenazopyridine  (PYRIDIUM ) 200 MG tablet Take 1 tablet (200 mg total) by mouth 3 (three) times daily as needed (for pain with urination). (Patient not taking: Reported on 10/27/2023)   potassium chloride  (KLOR-CON ) 20 MEQ packet Take 20 mEq by mouth daily.   Tiotropium Bromide-Olodaterol (STIOLTO RESPIMAT ) 2.5-2.5 MCG/ACT AERS Inhale 2 puffs into the lungs daily after lunch.   XIIDRA 5 % SOLN Place 1 drop into both eyes 2 (two) times daily.   No facility-administered encounter medications on file as of 01/25/2024.    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 Cindra Cree;  due to chemotherapy   Chronic rhinitis    w/ upper airway wheezing due to nasal drainage per pulmonogy note (dr Dione Franks),  using stiolto inhaler   Diabetes mellitus type 2, diet-controlled (HCC)    followed by pcp;    (04-20-2023 pt has started take farxiga daily;    per pt check blood sugar multiple times daily w/ Libre,  fasting average 120--125   Dyspnea on exertion    04-20-2023 pt stated sob w/ long distancewalk and stairs but recovers quickly when stop/ rest,  ok with household chores and short walks   Family history of breast cancer 05/04/2021   Family history of ovarian cancer 05/04/2021   Generalized weakness    GERD  (gastroesophageal reflux disease)    History of basal cell carcinoma (BCC) excision    per pt in 1970s on nose   History of chemotherapy    06-13-2020  to 12/ 2021  for right renal pelvis carcinoma   History of colon polyps    History of radiation therapy    11-13-2020 to 11-26-2020---   Right Lung- SBRT- Dr. Retta Caster   HLD (hyperlipidemia)    Hypertension    followed by pcp   Hypothyroidism, postsurgical 1979   followed by pcp   Leukocytosis    Nocturia    OA (osteoarthritis)    PONV (postoperative nausea and vomiting)    Since chemo, ponv   Port-A-Cath in place    Primary adenocarcinoma of upper lobe of right lung [facility]) 09/2020   oncologist--- dr dorsey/  pulmonology-- dr n. Dione Franks;  dx 02/ 2022;  s/p  SBRT 11-13-2020 to 11-26-2020   Urgency of urination    wears pads   Varicose veins  of both legs with edema    Wears partial dentures    lower   Past Surgical History:  Procedure Laterality Date   COLONOSCOPY  last one 2017   CYSTOSCOPY W/ URETERAL STENT PLACEMENT Right 01/27/2022   Procedure: CYSTOSCOPY WITH RETROGRADE PYELOGRAM/ URETEROSCOPY/POSSIBLE BIOPSY/ POSSIBLE  URETERAL STENT PLACEMENT;  Surgeon: Adelbert Homans, MD;  Location: [facility];  Service: Urology;  Laterality: Right;   CYSTOSCOPY WITH BIOPSY Right 09/16/2021   Procedure: CYSTOSCOPY / URETEROSCOPY WITH BIOPSY/bugbee fulgeration/right retrograde;  Surgeon: Adelbert Homans, MD;  Location: [facility];  Service: Urology;  Laterality: Right;   CYSTOSCOPY WITH BIOPSY N/A 11/02/2023   Procedure: CYSTOSCOPY WITH BIOPSY;  Surgeon: Adelbert Homans, MD;  Location: [facility];  Service: Urology;  Laterality: N/A;   CYSTOSCOPY WITH RETROGRADE PYELOGRAM, URETEROSCOPY AND STENT PLACEMENT Right 04/27/2023   Procedure: CYSTOSCOPY, WITH RIGHT RETROGRADE PYELOGRAM, RIGHT URETEROSCOPY, RIGHT URETERAL STENT PLACEMENT, FULGURATION OF RIGHT RENAL PELVIS TUMORS;  Surgeon: Adelbert Homans, MD;  Location: [facility];  Service: Urology;  Laterality: Right;   CYSTOSCOPY WITH STENT PLACEMENT Right 10/01/2022   Procedure: CYSTOSCOPY WITH STENT PLACEMENT;  Surgeon: Adelbert Homans, MD;  Location: [facility];  Service: Urology;  Laterality: Right;   CYSTOSCOPY WITH URETEROSCOPY Right 04/18/2020   Procedure: CYSTOSCOPY WITH URETEROSCOPY, BIOPSY;  Surgeon: Adelbert Homans, MD;  Location: [facility];  Service: Urology;  Laterality: Right;  ONLY NEEDS 45 MIN   CYSTOSCOPY WITH URETEROSCOPY Right 06/02/2022   Procedure: CYSTOSCOPY WITH URETEROSCOPY;  Surgeon: Adelbert Homans, MD;  Location: [facility];  Service: Urology;  Laterality: Right;  ONLY NEEDS 60 MINS   CYSTOSCOPY WITH URETEROSCOPY AND STENT PLACEMENT N/A 12/31/2020   Procedure: CYSTOSCOPY WITH RIGHT URETEROSCOPY/ BILATERAL RETROGRADE PYELOGRAM/RIGHT URETERAL BIOPSY/ LASER ABLATION/RIGHT STENT PLACEMENT;  Surgeon: Adelbert Homans, MD;  Location: [facility];  Service: Urology;  Laterality: N/A;   CYSTOSCOPY/RETROGRADE/URETEROSCOPY Right 04/22/2021   Procedure: CYSTOSCOPY/RETROGRADE/URETEROSCOPY/ STENT PLACEMENT;  Surgeon: Adelbert Homans, MD;  Location: [facility];  Service: Urology;  Laterality: Right;   CYSTOSCOPY/URETEROSCOPY/HOLMIUM LASER/STENT PLACEMENT Right 11/02/2023   Procedure: CYSTOSCOPY/RIGHT URETEROSCOPY/RETROGRADE PYELOGRAM/ LASER ABLATION/;  Surgeon: Adelbert Homans, MD;  Location: [facility];  Service: Urology;  Laterality: Right;  45 MINUTES NEEDED   EYE SURGERY Bilateral 1987   tear ducts   IR IMAGING GUIDED PORT INSERTION  06/04/2020   IR NEPHROSTOMY PLACEMENT RIGHT  03/03/2022   THYROID  LOBECTOMY Right 1979   TOTAL HIP ARTHROPLASTY Right 01/28/2016   Procedure: RIGHT TOTAL HIP ARTHROPLASTY ANTERIOR APPROACH;  Surgeon: Liliane Rei, MD;  Location: [facility];  Service: Orthopedics;   Laterality: Right;   UMBILICAL HERNIA REPAIR  1980s   URETEROSCOPY Right 10/01/2022   Procedure: URETEROSCOPY WITH BIOPSY pylegram;  Surgeon: Adelbert Homans, MD;  Location: [facility];  Service: Urology;  Laterality: Right;  45 MINS   VIDEO BRONCHOSCOPY WITH ENDOBRONCHIAL NAVIGATION N/A 06/06/2020   Procedure: VIDEO BRONCHOSCOPY WITH ENDOBRONCHIAL NAVIGATION;  Surgeon: Denson Flake, MD;  Location: [facility];  Service: Thoracic;  Laterality: N/A;   VIDEO BRONCHOSCOPY WITH ENDOBRONCHIAL NAVIGATION N/A 10/10/2020   Procedure: VIDEO BRONCHOSCOPY WITH ENDOBRONCHIAL NAVIGATION;  Surgeon: Zelphia Higashi, MD;  Location: [facility];  Service: Thoracic;  Laterality: N/A;   Family History  Problem Relation Age of Onset   Arthritis Mother    Breast cancer Mother 35   Schizophrenia Mother    Diabetes Father  Heart disease Father    Kidney disease Father    CAD Father    Stroke Sister    Lung cancer Brother        dx > 50; work-related exposures   Heart disease Brother    Heart disease Brother    Vision loss Brother        Glaucoma   Alcohol abuse Brother    Ovarian cancer Daughter 81   Lung cancer Maternal Aunt        dx > 50   Colon cancer Paternal Aunt        dx > 50   Head & neck cancer Paternal Uncle        dx 54s   Leukemia Cousin        d. 37s   Breast cancer Niece 79   Social History   Socioeconomic History   Marital status: Widowed    Spouse name: Not on file   Number of children: Not on file   Years of education: Not on file   Highest education level: Associate degree: occupational, Scientist, product/process development, or vocational program  Occupational History   Occupation: Retired  Tobacco Use   Smoking status: Former    Current packs/day: 0.00    Average packs/day: 1 pack/day for 45.0 years (45.0 ttl pk-yrs)    Types: Cigarettes    Start date: 04/19/1975    Quit date: 04/18/2020    Years since quitting: 3.7   Smokeless tobacco: Never  Vaping Use   Vaping  status: Never Used  Substance and Sexual Activity   Alcohol use: Not Currently    Comment: rare   Drug use: No   Sexual activity: Not on file  Other Topics Concern   Not on file  Social History Narrative   Not on file   Social Drivers of Health   Financial Resource Strain: Low Risk  (01/25/2024)   Overall Financial Resource Strain (CARDIA)    Difficulty of Paying Living Expenses: Not hard at all  Food Insecurity: No Food Insecurity (01/25/2024)   Hunger Vital Sign    Worried About Running Out of Food in the Last Year: Never true    Ran Out of Food in the Last Year: Never true  Transportation Needs: No Transportation Needs (01/25/2024)   PRAPARE - Administrator, Civil Service (Medical): No    Lack of Transportation (Non-Medical): No  Physical Activity: Insufficiently Active (01/25/2024)   Exercise Vital Sign    Days of Exercise per Week: 1 day    Minutes of Exercise per Session: 10 min  Stress: No Stress Concern Present (01/25/2024)   [employer] of Occupational Health - Occupational Stress Questionnaire    Feeling of Stress : Not at all  Social Connections: Moderately Isolated (01/25/2024)   Social Connection and Isolation Panel [NHANES]    Frequency of Communication with Friends and Family: Twice a week    Frequency of Social Gatherings with Friends and Family: Once a week    Attends Religious Services: Never    Database administrator or Organizations: No    Attends Engineer, structural: More than 4 times per year    Marital Status: Widowed    Tobacco Counseling Counseling given: Not Answered    Clinical Intake:  Pre-visit preparation completed: Yes  Pain : No/denies pain     BMI - recorded: 46.51 Nutritional Status: BMI > 30  Obese Nutritional Risks: None Diabetes: Yes CBG done?: Yes (CBG134 Per patient) CBG  resulted in Enter/ Edit results?: Yes Did pt. bring in CBG monitor from home?: Yes (Libre 3 Moniter) Glucose Meter Downloaded?:  No  Lab Results  Component Value Date   HGBA1C 7.2 (H) 10/31/2023   HGBA1C 6.7 (A) 10/07/2022   HGBA1C 6.3 (A) 07/06/2022     How often do you need to have someone help you when you read instructions, pamphlets, or other written materials from your doctor or pharmacy?: 1 - Never  Interpreter Needed?: No  Information entered by :: Farris Hong LPN   Activities of Daily Living     01/25/2024    3:30 PM 01/22/2024    6:02 PM  In your present state of health, do you have any difficulty performing the following activities:  Hearing? 0 0  Vision? 0 0  Difficulty concentrating or making decisions? 0 0  Walking or climbing stairs? 1 1  Comment Uses a Cane   Dressing or bathing? 0 0  Doing errands, shopping? 0 0  Preparing Food and eating ? N N  Using the Toilet? N N  In the past six months, have you accidently leaked urine? Maria Marquez  Comment Wears Pads. Followed by Urologist   Do you have problems with loss of bowel control? N N  Managing your Medications? N N  Managing your Finances? N N  Housekeeping or managing your Housekeeping? N N    Patient Care Team: Zilphia Hilt, Charyl Coppersmith, MD as PCP - General (Internal Medicine) Asencion Blacksmith, MD (Inactive) as Consulting Physician (Gastroenterology) Blase Bur as Consulting Physician (Surgery) Liliane Rei, MD as Consulting Physician (Orthopedic Surgery) Zara Heymann, MD as Referring Physician (Allergy) Cindia Crease, PsyD as Counselor (Psychology) Erman Hayward, MD as Consulting Physician (Urology) Carnell Christian, [facility] (Pharmacist) Aminta Kales, MD as Consulting Physician (Ophthalmology)  I have updated your Care Teams any recent Medical Services you may have received from other providers in the past year.     Assessment:    This is a routine wellness examination for Maria Marquez.  Hearing/Vision screen Hearing Screening - Comments:: Denies hearing difficulties   Vision Screening - Comments:: Wears rx  glasses - up to date with routine eye exams with  Dr Allison Ivory   Goals Addressed               This Visit's Progress     Increase physical activity (pt-stated)        Lose weight.       Depression Screen     01/25/2024    3:10 PM 10/14/2023    3:52 PM 12/23/2022    3:13 PM 07/06/2022   12:59 PM 03/17/2022    9:06 AM 02/16/2022    3:29 PM 09/22/2021    1:39 PM  PHQ 2/9 Scores  PHQ - 2 Score 0 0  0  6 0  PHQ- 9 Score    3  26 6   Exception Documentation   Patient refusal  Patient refusal      Fall Risk     01/25/2024    3:35 PM 01/22/2024    6:02 PM 10/14/2023    3:54 PM 12/23/2022    3:13 PM 09/23/2022    7:12 AM  Fall Risk   Falls in the past year? 0 0 0 1 0  Number falls in past yr: 0 0 0 1   Injury with Fall? 0 0 0 0   Risk for fall due to : No Fall Risks  No Fall Risks  Follow up Falls evaluation completed  Falls prevention discussed;Falls evaluation completed Falls evaluation completed     MEDICARE RISK AT HOME:  Medicare Risk at Home Any stairs in or around the home?: Yes If so, are there any without handrails?: Yes Home free of loose throw rugs in walkways, pet beds, electrical cords, etc?: No Adequate lighting in your home to reduce risk of falls?: Yes Life alert?: No Use of a cane, walker or w/c?: No Grab bars in the bathroom?: Yes Shower chair or bench in shower?: Yes Elevated toilet seat or a handicapped toilet?: Yes  TIMED UP AND GO:  Was the test performed?  Yes  Length of time to ambulate 10 feet: 10 sec Gait steady and fast without use of assistive device  Cognitive Function: 6CIT completed        01/25/2024    3:35 PM 10/14/2023    3:55 PM 12/31/2019    3:11 PM  6CIT Screen  What Year? 0 points 0 points 0 points  What month? 0 points 0 points 0 points  What time? 0 points 0 points 0 points  Count back from 20 0 points 0 points 0 points  Months in reverse 0 points 0 points 0 points  Repeat phrase 0 points 2 points 0 points  Total Score 0 points 2  points 0 points    Immunizations Immunization History  Administered Date(s) Administered   Fluad Quad(high Dose 65+) 04/28/2016, 06/14/2019, 07/11/2020, 05/03/2022, 05/04/2023   Influenza, High Dose Seasonal PF 04/28/2016, 07/19/2017, 05/19/2018, 05/15/2021   Influenza, Seasonal, Injecte, Preservative Fre 04/25/2015   Influenza-Unspecified 04/25/2015, 04/28/2016, 07/12/2017, 05/25/2021, 05/03/2022   Janssen (J&J) SARS-COV-2 Vaccination 04/22/2020   Moderna Sars-Covid-2 Vaccination 05/15/2021   PFIZER(Purple Top)SARS-COV-2 Vaccination 07/03/2020, 01/13/2021   Pneumococcal Conjugate-13 06/13/2015   Pneumococcal Polysaccharide-23 06/10/2016   Pneumococcal-Unspecified 08/27/2019   Rsv, Bivalent, Protein Subunit Rsvpref,pf Pattricia Bores) 05/18/2023   Tdap 06/16/2014   Zoster Recombinant(Shingrix ) 01/08/2020, 05/13/2020   Zoster, Live 05/27/2014    Screening Tests Health Maintenance  Topic Date Due   Colonoscopy  12/22/2020   COVID-19 Vaccine (5 - 2024-25 season) 04/24/2023   INFLUENZA VACCINE  03/23/2024   HEMOGLOBIN A1C  05/02/2024   DTaP/Tdap/Td (2 - Td or Tdap) 06/16/2024   Diabetic kidney evaluation - Urine ACR  07/15/2024   MAMMOGRAM  08/30/2024   FOOT EXAM  10/21/2024   Diabetic kidney evaluation - eGFR measurement  12/18/2024   OPHTHALMOLOGY EXAM  12/21/2024   Medicare Annual Wellness (AWV)  01/24/2025   DEXA SCAN  05/12/2026   Pneumonia Vaccine 62+ Years old  Completed   Hepatitis C Screening  Completed   Zoster Vaccines- Shingrix   Completed   HPV VACCINES  Aged Out   Meningococcal B Vaccine  Aged Out    Health Maintenance  Health Maintenance Due  Topic Date Due   Colonoscopy  12/22/2020   COVID-19 Vaccine (5 - 2024-25 season) 04/24/2023   Health Maintenance Items Addressed: Patient deferred Colonoscopy   Additional Screening:  Vision Screening: Recommended annual ophthalmology exams for early detection of glaucoma and other disorders of the eye. Would you like  a referral to an eye doctor? No    Dental Screening: Recommended annual dental exams for proper oral hygiene  Community Resource Referral / Chronic Care Management: CRR required this visit?  No   CCM required this visit?  No   Plan:    I have personally reviewed and noted the following in the patient's chart:   Medical and social history  Use of alcohol, tobacco or illicit drugs  Current medications and supplements including opioid prescriptions. Patient is not currently taking opioid prescriptions. Functional ability and status Nutritional status Physical activity Advanced directives List of other physicians Hospitalizations, surgeries, and ER visits in previous 12 months Vitals Screenings to include cognitive, depression, and falls Referrals and appointments  In addition, I have reviewed and discussed with patient certain preventive protocols, quality metrics, and best practice recommendations. A written personalized care plan for preventive services as well as general preventive health recommendations were provided to patient.   Dewayne Ford, LPN   11/21/6604   After Visit Summary: (In Person-Printed) AVS printed and given to the patient  Notes: Nothing significant to report at this time.

## 2024-01-26 ENCOUNTER — Other Ambulatory Visit: Payer: Self-pay | Admitting: Internal Medicine

## 2024-02-08 DIAGNOSIS — H16223 Keratoconjunctivitis sicca, not specified as Sjogren's, bilateral: Secondary | ICD-10-CM | POA: Diagnosis not present

## 2024-02-13 DIAGNOSIS — C651 Malignant neoplasm of right renal pelvis: Secondary | ICD-10-CM | POA: Diagnosis not present

## 2024-02-16 ENCOUNTER — Encounter: Payer: Self-pay | Admitting: Internal Medicine

## 2024-02-16 ENCOUNTER — Ambulatory Visit (INDEPENDENT_AMBULATORY_CARE_PROVIDER_SITE_OTHER): Admitting: Internal Medicine

## 2024-02-16 VITALS — BP 110/70 | HR 80 | Temp 98.1°F | Wt 278.4 lb

## 2024-02-16 DIAGNOSIS — E782 Mixed hyperlipidemia: Secondary | ICD-10-CM | POA: Diagnosis not present

## 2024-02-16 DIAGNOSIS — I10 Essential (primary) hypertension: Secondary | ICD-10-CM | POA: Diagnosis not present

## 2024-02-16 DIAGNOSIS — E1169 Type 2 diabetes mellitus with other specified complication: Secondary | ICD-10-CM | POA: Diagnosis not present

## 2024-02-16 DIAGNOSIS — E039 Hypothyroidism, unspecified: Secondary | ICD-10-CM

## 2024-02-16 DIAGNOSIS — Z79890 Hormone replacement therapy (postmenopausal): Secondary | ICD-10-CM

## 2024-02-16 LAB — POCT GLYCOSYLATED HEMOGLOBIN (HGB A1C): Hemoglobin A1C: 6.7 % — AB (ref 4.0–5.6)

## 2024-02-16 MED ORDER — ESTRADIOL-NORETHINDRONE ACET 1-0.5 MG PO TABS: 1.0000 | ORAL_TABLET | Freq: Every day | ORAL | 3 refills | Status: DC

## 2024-02-16 NOTE — Progress Notes (Signed)
 Established Patient Office Visit     CC/Reason for Visit: Follow-up chronic conditions  HPI: Maria Marquez is a 78 y.o. female who is coming in today for the above mentioned reasons. Past Medical History is significant for: Hypertension, hyperlipidemia, hypothyroidism, morbid obesity, urothelial carcinoma.  She is grieving the death of her daughter in 2024/08/22.  She is doing okay.   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 macel;  due to chemotherapy   Chronic rhinitis    w/ upper airway wheezing due to nasal drainage per pulmonogy note (dr meade),  using stiolto inhaler   Diabetes mellitus type 2, diet-controlled (HCC)    followed by pcp;    (04-20-2023 pt has started take farxiga daily;    per pt check blood sugar multiple times daily w/ Libre,  fasting average 120--125   Dyspnea on exertion    04-20-2023 pt stated sob w/ long distancewalk and stairs but recovers quickly when stop/ rest,  ok with household chores and short walks   Family history of breast cancer 05/04/2021   Family history of ovarian cancer 05/04/2021   Generalized weakness    GERD (gastroesophageal reflux disease)    History of basal cell carcinoma (BCC) excision    per pt in 1970s on nose   History of chemotherapy    06-13-2020  to 12/ 2021  for right renal pelvis carcinoma   History of colon polyps    History of radiation therapy    11-13-2020 to 11-26-2020---   Right Lung- SBRT- Dr. Lynwood Nasuti   HLD (hyperlipidemia)    Hypertension    followed by pcp    Hypothyroidism, postsurgical 1979   followed by pcp   Leukocytosis    Nocturia    OA (osteoarthritis)    PONV (postoperative nausea and vomiting)    Since chemo, ponv   Port-A-Cath in place    Primary adenocarcinoma of upper lobe of right lung [facility]) 09/2020   oncologist--- dr dorsey/  pulmonology-- dr n. meade;  dx 02/ 2022;  s/p  SBRT 11-13-2020 to 11-26-2020   Urgency of urination    wears pads   Varicose veins of both legs with edema    Wears partial dentures    lower    Past Surgical History:  Procedure Laterality Date   COLONOSCOPY  last one 2017   CYSTOSCOPY W/ URETERAL STENT PLACEMENT Right 01/27/2022   Procedure: CYSTOSCOPY WITH RETROGRADE PYELOGRAM/ URETEROSCOPY/POSSIBLE BIOPSY/ POSSIBLE  URETERAL STENT PLACEMENT;  Surgeon: Devere Lonni Righter, MD;  Location[facility];  Service: Urology;  Laterality: Right;   CYSTOSCOPY WITH BIOPSY Right 09/16/2021   Procedure: CYSTOSCOPY / URETEROSCOPY WITH BIOPSY/bugbee fulgeration/right retrograde;  Surgeon: Devere Lonni Righter, MD;  Location: [facility];  Service: Urology;  Laterality: Right;   CYSTOSCOPY WITH BIOPSY N/A 11/02/2023   Procedure: CYSTOSCOPY WITH BIOPSY;  Surgeon: Devere Lonni Righter, MD;  Location: [facility];  Service: Urology;  Laterality: N/A;   CYSTOSCOPY WITH RETROGRADE PYELOGRAM, URETEROSCOPY AND STENT PLACEMENT  Right 04/27/2023   Procedure: CYSTOSCOPY, WITH RIGHT RETROGRADE PYELOGRAM, RIGHT URETEROSCOPY, RIGHT URETERAL STENT PLACEMENT, FULGURATION OF RIGHT RENAL PELVIS TUMORS;  Surgeon: Devere Lonni Righter, MD;  Location: [facility];  Service: Urology;  Laterality: Right;   CYSTOSCOPY WITH STENT PLACEMENT Right 10/01/2022   Procedure: CYSTOSCOPY WITH STENT PLACEMENT;  Surgeon: Devere Lonni Righter, MD;  Location[facility];  Service: Urology;  Laterality: Right;   CYSTOSCOPY WITH URETEROSCOPY Right 04/18/2020   Procedure: CYSTOSCOPY  WITH URETEROSCOPY, BIOPSY;  Surgeon: Devere Lonni Righter, MD;  Location: [facility];  Service: Urology;  Laterality: Right;  ONLY NEEDS 45 MIN   CYSTOSCOPY WITH URETEROSCOPY Right 06/02/2022   Procedure: CYSTOSCOPY WITH URETEROSCOPY;  Surgeon: Devere Lonni Righter, MD;  Location: [facility];  Service: Urology;  Laterality: Right;  ONLY NEEDS 60 MINS   CYSTOSCOPY WITH URETEROSCOPY AND STENT PLACEMENT N/A 12/31/2020   Procedure: CYSTOSCOPY WITH RIGHT URETEROSCOPY/ BILATERAL RETROGRADE PYELOGRAM/RIGHT URETERAL BIOPSY/ LASER ABLATION/RIGHT STENT PLACEMENT;  Surgeon: Devere Lonni Righter, MD;  Location: [facility];  Service: Urology;  Laterality: N/A;   CYSTOSCOPY/RETROGRADE/URETEROSCOPY Right 04/22/2021   Procedure: CYSTOSCOPY/RETROGRADE/URETEROSCOPY/ STENT PLACEMENT;  Surgeon: Devere Lonni Righter, MD;  Location[facility];  Service: Urology;  Laterality: Right;   CYSTOSCOPY/URETEROSCOPY/HOLMIUM LASER/STENT PLACEMENT Right 11/02/2023   Procedure: CYSTOSCOPY/RIGHT URETEROSCOPY/RETROGRADE PYELOGRAM/ LASER ABLATION/;  Surgeon: Devere Lonni Righter, MD;  Location: [facility];  Service: Urology;  Laterality: Right;  45 MINUTES NEEDED   EYE SURGERY Bilateral 1987   tear ducts   IR IMAGING GUIDED PORT INSERTION  06/04/2020   IR NEPHROSTOMY PLACEMENT RIGHT  03/03/2022   THYROID  LOBECTOMY Right 1979   TOTAL HIP ARTHROPLASTY Right 01/28/2016   Procedure: RIGHT TOTAL HIP ARTHROPLASTY ANTERIOR APPROACH;  Surgeon: Dempsey Moan, MD;  Location: [facility];  Service: Orthopedics;  Laterality: Right;   UMBILICAL HERNIA REPAIR  1980s   URETEROSCOPY Right 10/01/2022   Procedure: URETEROSCOPY WITH BIOPSY pylegram;  Surgeon: Devere Lonni Righter, MD;  Location: [facility];  Service: Urology;  Laterality: Right;  45 MINS   VIDEO BRONCHOSCOPY WITH ENDOBRONCHIAL NAVIGATION N/A 06/06/2020   Procedure: VIDEO BRONCHOSCOPY WITH ENDOBRONCHIAL  NAVIGATION;  Surgeon: Shelah Lamar RAMAN, MD;  Location: [facility];  Service: Thoracic;  Laterality: N/A;   VIDEO BRONCHOSCOPY WITH ENDOBRONCHIAL NAVIGATION N/A 10/10/2020   Procedure: VIDEO BRONCHOSCOPY WITH ENDOBRONCHIAL NAVIGATION;  Surgeon: Kerrin Elspeth BROCKS, MD;  Location: [facility];  Service: Thoracic;  Laterality: N/A;    Social History:  reports that she quit smoking about 3 years ago. Her smoking use included cigarettes. She started smoking about 48 years ago. She has a 45 pack-year smoking history. She has never used smokeless tobacco. She reports that she does not currently use alcohol. She reports that she does not use drugs.  Allergies: Allergies  Allergen Reactions   Iodine Shortness Of Breath and Other (See Comments)   Keflex  [Cephalexin ] Shortness Of Breath, Rash and Tinitus   Shellfish Allergy Hives   Shellfish-Derived Products Anaphylaxis and Hives   Hydrocodone  Hives and Itching    Itching throat   Lasix  [Furosemide ] Itching   Rosuvastatin  Other (See Comments)    Muscle Pain in Thighs   Ciprofloxacin  Other (See Comments)   Latex Rash   Lisinopril Cough   Sulfa Antibiotics Nausea Only   Tramadol  Itching    Family History:  Family History  Problem Relation Age of Onset   Arthritis Mother    Breast cancer Mother 20   Schizophrenia  Mother    Diabetes Father    Heart disease Father    Kidney disease Father    CAD Father    Stroke Sister    Lung cancer Brother        dx > 50; work-related exposures   Heart disease Brother    Heart disease Brother    Vision loss Brother        Glaucoma   Alcohol abuse Brother    Ovarian cancer Daughter 35   Lung cancer Maternal Aunt        dx > 50   Colon cancer Paternal Aunt        dx > 50   Head & neck cancer Paternal Uncle        dx 64s   Leukemia Cousin        d. 78s   Breast cancer Niece 62     Current Outpatient Medications:    acetaminophen  (TYLENOL ) 500 MG tablet, Take 1,500 mg by mouth every 6 (six) hours as  needed for moderate pain (pain score 4-6)., Disp: , Rfl:    albuterol  (VENTOLIN  HFA) 108 (90 Base) MCG/ACT inhaler, INHALE ONE OR TWO PUFFS BY MOUTH INTO THE LUNGS EVERY SIX HOURS AS NEEDED FOR WHEEZING OR SHORTNESS OF BREATH, Disp: 8.5 g, Rfl: 0   atorvastatin  (LIPITOR) 20 MG tablet, TAKE 1 TABLET EVERY DAY (NEED APPOINTMENT), Disp: 90 tablet, Rfl: 3   Continuous Blood Gluc Receiver (FREESTYLE LIBRE 3 READER) DEVI, 1 each by Does not apply route daily., Disp: 1 each, Rfl: 3   Continuous Glucose Sensor (FREESTYLE LIBRE 3 SENSOR) MISC, PLACE ONE SENSOR ONTO THE SKIN ONCE EVERY 14 DAYS; *CHECK GLUCOSE CONTINUOUSLY* REMOVE OLD SENSOR, Disp: 2 each, Rfl: 3   dapagliflozin propanediol (FARXIGA) 10 MG TABS tablet, Take 10 mg by mouth daily., Disp: , Rfl:    EPINEPHRINE  0.3 mg/0.3 mL IJ SOAJ injection, INJECT 0.3MG  INTO THE MUSCLE AS NEEDED FOR ANAPHYLAXIS, Disp: 2 each, Rfl: 0   ezetimibe  (ZETIA ) 10 MG tablet, TAKE 1 TABLET EVERY DAY (NEED MD APPOINTMENT FOR REFILLS), Disp: 90 tablet, Rfl: 3   fluticasone  (FLONASE ) 50 MCG/ACT nasal spray, Place 1 spray into both nostrils daily., Disp: 48 each, Rfl: 11   furosemide  (LASIX ) 40 MG tablet, Take 40 mg by mouth 2 (two) times daily., Disp: , Rfl:    levothyroxine  (SYNTHROID ) 150 MCG tablet, TAKE 1 TABLET EVERY DAY (NEED MD APPOINTMENT), Disp: 90 tablet, Rfl: 1   lidocaine -prilocaine  (EMLA ) cream, Apply 1 application. topically as needed., Disp: 30 g, Rfl: 0   losartan -hydrochlorothiazide  (HYZAAR) 100-25 MG tablet, Take 1 tablet by mouth daily., Disp: , Rfl:    Multiple Vitamins-Minerals (CENTRUM SILVER 50+WOMEN) TABS, Take 2 tablets by mouth daily., Disp: , Rfl:    oxybutynin  (DITROPAN ) 5 MG tablet, Take 1 tablet (5 mg total) by mouth every 8 (eight) hours as needed for bladder spasms., Disp: 30 tablet, Rfl: 1   pantoprazole  (PROTONIX ) 40 MG tablet, TAKE 1 TABLET BY MOUTH EVERY DAY, Disp: 90 tablet, Rfl: 1   phenazopyridine  (PYRIDIUM ) 200 MG tablet, Take 1 tablet  (200 mg total) by mouth 3 (three) times daily as needed (for pain with urination)., Disp: 30 tablet, Rfl: 0   potassium chloride  (KLOR-CON ) 20 MEQ packet, Take 20 mEq by mouth daily., Disp: , Rfl:    Semaglutide,0.25 or 0.5MG /DOS, 2 MG/1.5ML SOPN, Inject 0.25 mLs into the skin once a week., Disp: , Rfl:    Tiotropium Bromide-Olodaterol (STIOLTO RESPIMAT ) 2.5-2.5 MCG/ACT AERS,  Inhale 2 puffs into the lungs daily after lunch., Disp: 4 g, Rfl: 11   XIIDRA 5 % SOLN, Place 1 drop into both eyes 2 (two) times daily., Disp: , Rfl:    estradiol -norethindrone  (ACTIVELLA) 1-0.5 MG tablet, Take 1 tablet by mouth daily., Disp: 28 tablet, Rfl: 3  Review of Systems:  Negative unless indicated in HPI.   Physical Exam: Vitals:   02/16/24 1333  BP: 110/70  Pulse: 80  Temp: 98.1 F (36.7 C)  TempSrc: Oral  SpO2: 97%  Weight: 278 lb 6.4 oz (126.3 kg)    Body mass index is 46.33 kg/m.   Physical Exam Vitals reviewed.  Constitutional:      Appearance: Normal appearance. She is obese.  HENT:     Head: Normocephalic and atraumatic.   Eyes:     Conjunctiva/sclera: Conjunctivae normal.    Cardiovascular:     Rate and Rhythm: Normal rate and regular rhythm.  Pulmonary:     Effort: Pulmonary effort is normal.     Breath sounds: Normal breath sounds.   Skin:    General: Skin is warm and dry.   Neurological:     General: No focal deficit present.     Mental Status: She is alert and oriented to person, place, and time.   Psychiatric:        Mood and Affect: Mood normal.        Behavior: Behavior normal.        Thought Content: Thought content normal.        Judgment: Judgment normal.     Impression and Plan:  Type 2 diabetes mellitus with other specified complication, without long-term current use of insulin  (HCC) -     POCT glycosylated hemoglobin (Hb A1C)  Acquired hypothyroidism  Essential hypertension  Morbid obesity (HCC)  Mixed hyperlipidemia  Post-menopause on HRT  (hormone replacement therapy) -     Estradiol -Norethindrone  Acet; Take 1 tablet by mouth daily.  Dispense: 28 tablet; Refill: 3  - A1c of 6.7 demonstrates well-controlled diabetes. - Last TSH was within normal range. - Blood pressure is well-controlled on current.   Time spent:30 minutes reviewing chart, interviewing and examining patient and formulating plan of care.     Tully Theophilus Andrews, MD  Primary Care at [facility]

## 2024-02-21 ENCOUNTER — Ambulatory Visit (INDEPENDENT_AMBULATORY_CARE_PROVIDER_SITE_OTHER): Admitting: Podiatry

## 2024-02-22 ENCOUNTER — Other Ambulatory Visit: Payer: Self-pay | Admitting: Urology

## 2024-02-22 ENCOUNTER — Inpatient Hospital Stay: Payer: Medicare HMO

## 2024-02-22 ENCOUNTER — Other Ambulatory Visit: Payer: Self-pay | Admitting: *Deleted

## 2024-02-22 DIAGNOSIS — Z95828 Presence of other vascular implants and grafts: Secondary | ICD-10-CM

## 2024-02-22 MED ORDER — LIDOCAINE-PRILOCAINE 2.5-2.5 % EX CREA: 1.0000 | TOPICAL_CREAM | CUTANEOUS | 0 refills | Status: AC | PRN

## 2024-02-23 ENCOUNTER — Other Ambulatory Visit: Payer: Self-pay | Admitting: Urology

## 2024-02-26 ENCOUNTER — Encounter: Payer: Self-pay | Admitting: Internal Medicine

## 2024-02-28 MED ORDER — FREESTYLE LIBRE 3 SENSOR MISC: 3 refills | Status: DC

## 2024-02-28 NOTE — Patient Instructions (Addendum)
 SURGICAL WAITING ROOM VISITATION Patients having surgery or a procedure may have no more than 2 support people in the waiting area - these visitors may rotate in the visitor waiting room.   If the patient needs to stay at the hospital during part of their recovery, the visitor guidelines for inpatient rooms apply.  PRE-OP VISITATION  Pre-op nurse will coordinate an appropriate time for 1 support person to accompany the patient in pre-op.  This support person may not rotate.  This visitor will be contacted when the time is appropriate for the visitor to come back in the pre-op area.  Please refer to the [facility] website for the visitor guidelines for Inpatients (after your surgery is over and you are in a regular room).  You are not required to quarantine at this time prior to your surgery. However, you must do this: Hand Hygiene often Do NOT share personal items Notify your provider if you are in close contact with someone who has COVID or you develop fever 100.4 or greater, new onset of sneezing, cough, sore throat, shortness of breath or body aches.  If you test positive for Covid or have been in contact with anyone that has tested positive in the last 10 days please notify you surgeon.    Your procedure is scheduled on:  Wednesday  March 07, 2024  Report to [facility] Main Entrance: [address] entrance where the [employer] is available.   Report to admitting at: 07:45 AM  Call this number if you have any questions or problems the morning of surgery [phone]  DO NOT EAT OR DRINK ANYTHING AFTER MIDNIGHT THE NIGHT PRIOR TO YOUR SURGERY / PROCEDURE.   FOLLOW  ANY ADDITIONAL PRE OP INSTRUCTIONS YOU RECEIVED FROM YOUR SURGEON'S OFFICE!!!   Oral Hygiene is also important to reduce your risk of infection.        Remember - BRUSH YOUR TEETH THE MORNING OF SURGERY WITH YOUR REGULAR TOOTHPASTE  Do NOT smoke after Midnight the night before surgery.  STOP TAKING all Vitamins,  Herbs and supplements 1 week before your surgery.   Semaglutide injection:  stop 7-10 days before surgery, last injection will be on: Dapagliflozin (Fraxiga) - stop x 72 hrs before surgery, last dose: Saturday 03-03-2024   Take ONLY these medicines the morning of surgery with A SIP OF WATER : Levothyroxine , and Tylenol  if needed. You may use Flonase  nasal spray, Eye drops, and inhalers if needed.    You may not have any metal on your body including hair pins, jewelry, and body piercing  Do not wear make-up, lotions, powders, perfumes or deodorant  Do not wear nail polish including gel and S&S, artificial / acrylic nails, or any other type of covering on natural nails including finger and toenails. If you have artificial nails, gel coating, etc., that needs to be removed by a nail salon, Please have this removed prior to surgery. Not doing so may mean that your surgery could be cancelled or delayed if the Surgeon or anesthesia staff feels like they are unable to monitor you safely.   Do not shave 48 hours prior to surgery to avoid nicks in your skin which may contribute to postoperative infections.   Contacts, Hearing Aids, dentures or bridgework may not be worn into surgery. DENTURES WILL BE REMOVED PRIOR TO SURGERY PLEASE DO NOT APPLY Poly grip OR ADHESIVES!!!  Patients discharged on the day of surgery will not be allowed to drive home.  Someone NEEDS to stay  with you for the first 24 hours after anesthesia.  Do not bring your home medications to the hospital. The Pharmacy will dispense medications listed on your medication list to you during your admission in the Hospital.  Please read over the following fact sheets you were given: IF YOU HAVE QUESTIONS ABOUT YOUR PRE-OP INSTRUCTIONS, PLEASE CALL [phone].   [facility] - Preparing for Surgery Before surgery, you can play an important role.  Because skin is not sterile, your skin needs to be as free of germs as possible.  You can  reduce the number of germs on your skin by washing with CHG (chlorahexidine gluconate) soap before surgery.  CHG is an antiseptic cleaner which kills germs and bonds with the skin to continue killing germs even after washing. Please DO NOT use if you have an allergy to CHG or antibacterial soaps.  If your skin becomes reddened/irritated stop using the CHG and inform your nurse when you arrive at Short Stay. Do not shave (including legs and underarms) for at least 48 hours prior to the first CHG shower.  You may shave your face/neck.  Please follow these instructions carefully:  1.  Shower with CHG Soap the night before surgery and the  morning of surgery.  2.  If you choose to wash your hair, wash your hair first as usual with your normal  shampoo.  3.  After you shampoo, rinse your hair and body thoroughly to remove the shampoo.                             4.  Use CHG as you would any other liquid soap.  You can apply chg directly to the skin and wash.  Gently with a scrungie or clean washcloth.  5.  Apply the CHG Soap to your body ONLY FROM THE NECK DOWN.   Do not use on face/ open                           Wound or open sores. Avoid contact with eyes, ears mouth and genitals (private parts).                       Wash face,  Genitals (private parts) with your normal soap.             6.  Wash thoroughly, paying special attention to the area where your  surgery  will be performed.  7.  Thoroughly rinse your body with warm water  from the neck down.  8.  DO NOT shower/wash with your normal soap after using and rinsing off the CHG Soap.            9.  Pat yourself dry with a clean towel.            10.  Wear clean pajamas.            11.  Place clean sheets on your bed the night of your first shower and do not  sleep with pets.  ON THE DAY OF SURGERY : Do not apply any lotions/deodorants the morning of surgery.  Please wear clean clothes to the hospital/surgery center.    FAILURE TO FOLLOW  THESE INSTRUCTIONS MAY RESULT IN THE CANCELLATION OF YOUR SURGERY  PATIENT SIGNATURE_________________________________  NURSE SIGNATURE__________________________________  ________________________________________________________________________

## 2024-02-28 NOTE — Progress Notes (Signed)
 COVID Vaccine received:  []  No [x]  Yes Date of any COVID positive Test in last 90 days:  PCP - Tully Ethridge Andrews, MD Cardiologist - none Nephrology- Dr. GORMAN. Peeples  Chest x-ray - CT Chest 12-21-2023  Epic EKG -  04-27-2023  Epic Stress Test -  ECHO - 02-11-2023  Epic Cardiac Cath -  CT Coronary Calcium  score:   Bowel Prep - [x]  No  []   Yes ______  Pacemaker / ICD device [x]  No []  Yes   Spinal Cord Stimulator:[x]  No []  Yes       History of Sleep Apnea? [x]  No []  Yes   CPAP used?- [x]  No []  Yes    Does the patient monitor blood sugar?   []  N/A   []  No [x]  Yes  Patient has: []  NO Hx DM   []  Pre-DM   []  DM1  [x]   DM2 Last A1c was:6.7 on  02-16-24  Epic    Does patient have a [employer] or Dexcom? []  No [x]  Yes   Fasting Blood Sugar Ranges-  Checks Blood Sugar _____ times a day  Semaglutide injection:  hold 7-10 days  Dapagliflozin (Fraxiga) - Hold x 72 hrs, last dose: 03-03-2024 Saturday  Blood Thinner / Instructions: none Aspirin Instructions: none  ERAS Protocol Ordered: [x]  No  []  Yes Patient is to be NPO after: MN Prior  Dental hx: []  Dentures:  []  N/A      []  Bridge or Partial:                   []  Loose or Damaged teeth:   Comments:   Activity level: Able to walk up 2 flights of stairs without becoming significantly short of breath or having chest pain?  []  No   []    Yes   Anesthesia review: DM2. HTN, CKD3b, anemia, PONV, current chemo tx- portacath  Patient denies any S&S of respiratory illness or Covid - no shortness of breath, fever, cough or chest pain at PAT appointment.  Patient verbalized understanding and agreement to the Pre-Surgical Instructions that were given to them at this PAT appointment. Patient was also educated of the need to review these PAT instructions again prior to her surgery.I reviewed the appropriate phone numbers to call if they have any and questions or concerns.

## 2024-02-29 ENCOUNTER — Encounter (HOSPITAL_COMMUNITY)
Admission: RE | Admit: 2024-02-29 | Discharge: 2024-02-29 | Disposition: A | Discharge status: Home / Self Care | Source: Ambulatory Visit | Attending: Urology | Admitting: Urology

## 2024-02-29 ENCOUNTER — Encounter (HOSPITAL_COMMUNITY): Payer: Self-pay

## 2024-02-29 ENCOUNTER — Other Ambulatory Visit: Payer: Self-pay

## 2024-02-29 VITALS — BP 105/50 | HR 76 | Temp 98.0°F | Resp 20 | Ht 65.0 in | Wt 275.0 lb

## 2024-02-29 DIAGNOSIS — E1142 Type 2 diabetes mellitus with diabetic polyneuropathy: Secondary | ICD-10-CM | POA: Insufficient documentation

## 2024-02-29 DIAGNOSIS — K219 Gastro-esophageal reflux disease without esophagitis: Secondary | ICD-10-CM | POA: Insufficient documentation

## 2024-02-29 DIAGNOSIS — Z85118 Personal history of other malignant neoplasm of bronchus and lung: Secondary | ICD-10-CM | POA: Diagnosis not present

## 2024-02-29 DIAGNOSIS — J439 Emphysema, unspecified: Secondary | ICD-10-CM | POA: Insufficient documentation

## 2024-02-29 DIAGNOSIS — F329 Major depressive disorder, single episode, unspecified: Secondary | ICD-10-CM | POA: Diagnosis not present

## 2024-02-29 DIAGNOSIS — N289 Disorder of kidney and ureter, unspecified: Secondary | ICD-10-CM | POA: Insufficient documentation

## 2024-02-29 DIAGNOSIS — Z01812 Encounter for preprocedural laboratory examination: Secondary | ICD-10-CM | POA: Insufficient documentation

## 2024-02-29 DIAGNOSIS — E1122 Type 2 diabetes mellitus with diabetic chronic kidney disease: Secondary | ICD-10-CM | POA: Diagnosis not present

## 2024-02-29 DIAGNOSIS — I129 Hypertensive chronic kidney disease with stage 1 through stage 4 chronic kidney disease, or unspecified chronic kidney disease: Secondary | ICD-10-CM | POA: Insufficient documentation

## 2024-02-29 DIAGNOSIS — N1831 Chronic kidney disease, stage 3a: Secondary | ICD-10-CM | POA: Insufficient documentation

## 2024-02-29 DIAGNOSIS — E669 Obesity, unspecified: Secondary | ICD-10-CM | POA: Diagnosis not present

## 2024-02-29 DIAGNOSIS — Z6841 Body Mass Index (BMI) 40.0 and over, adult: Secondary | ICD-10-CM | POA: Insufficient documentation

## 2024-02-29 DIAGNOSIS — I2510 Atherosclerotic heart disease of native coronary artery without angina pectoris: Secondary | ICD-10-CM | POA: Diagnosis not present

## 2024-02-29 DIAGNOSIS — Z9221 Personal history of antineoplastic chemotherapy: Secondary | ICD-10-CM | POA: Insufficient documentation

## 2024-02-29 DIAGNOSIS — C661 Malignant neoplasm of right ureter: Secondary | ICD-10-CM | POA: Insufficient documentation

## 2024-02-29 DIAGNOSIS — Z87891 Personal history of nicotine dependence: Secondary | ICD-10-CM | POA: Insufficient documentation

## 2024-02-29 DIAGNOSIS — E039 Hypothyroidism, unspecified: Secondary | ICD-10-CM | POA: Diagnosis not present

## 2024-02-29 DIAGNOSIS — Z01818 Encounter for other preprocedural examination: Secondary | ICD-10-CM

## 2024-02-29 LAB — CBC
HCT: 42.3 % (ref 36.0–46.0)
Hemoglobin: 13.5 g/dL (ref 12.0–15.0)
MCH: 29.6 pg (ref 26.0–34.0)
MCHC: 31.9 g/dL (ref 30.0–36.0)
MCV: 92.8 fL (ref 80.0–100.0)
Platelets: 268 K/uL (ref 150–400)
RBC: 4.56 MIL/uL (ref 3.87–5.11)
RDW: 14.5 % (ref 11.5–15.5)
WBC: 9.6 K/uL (ref 4.0–10.5)
nRBC: 0 % (ref 0.0–0.2)

## 2024-02-29 LAB — COMPREHENSIVE METABOLIC PANEL WITH GFR
ALT: 15 U/L (ref 0–44)
AST: 16 U/L (ref 15–41)
Albumin: 3.8 g/dL (ref 3.5–5.0)
Alkaline Phosphatase: 75 U/L (ref 38–126)
Anion gap: 13 (ref 5–15)
BUN: 37 mg/dL — ABNORMAL HIGH (ref 8–23)
CO2: 25 mmol/L (ref 22–32)
Calcium: 9 mg/dL (ref 8.9–10.3)
Chloride: 100 mmol/L (ref 98–111)
Creatinine, Ser: 1.73 mg/dL — ABNORMAL HIGH (ref 0.44–1.00)
GFR, Estimated: 30 mL/min — ABNORMAL LOW (ref >60)
Glucose, Bld: 134 mg/dL — ABNORMAL HIGH (ref 70–99)
Potassium: 3.5 mmol/L (ref 3.5–5.1)
Sodium: 138 mmol/L (ref 135–145)
Total Bilirubin: 1.4 mg/dL — ABNORMAL HIGH (ref 0.0–1.2)
Total Protein: 7.3 g/dL (ref 6.5–8.1)

## 2024-02-29 LAB — GLUCOSE, CAPILLARY: Glucose-Capillary: 155 mg/dL — ABNORMAL HIGH (ref 70–99)

## 2024-03-02 ENCOUNTER — Inpatient Hospital Stay

## 2024-03-05 NOTE — Anesthesia Preprocedure Evaluation (Signed)
 Anesthesia Evaluation  Patient identified by MRN, date of birth, ID band Patient awake    Reviewed: Allergy & Precautions, NPO status , Patient's Chart, lab work & pertinent test results  History of Anesthesia Complications (+) PONV and history of anesthetic complications  Airway Mallampati: III  TM Distance: >3 FB Neck ROM: Full    Dental  (+) Edentulous Lower, Dental Advisory Given, Missing, Chipped   Pulmonary shortness of breath, former smoker   Pulmonary exam normal breath sounds clear to auscultation       Cardiovascular hypertension, Pt. on medications Normal cardiovascular exam Rhythm:Regular Rate:Normal  TTE 2024  1. Left ventricular ejection fraction, by estimation, is 60 to 65%. The  left ventricle has normal function. The left ventricle has no regional  wall motion abnormalities. There is mild concentric left ventricular  hypertrophy. Left ventricular diastolic  parameters are consistent with Grade I diastolic dysfunction (impaired  relaxation).   2. Right ventricular systolic function is normal. The right ventricular  size is normal. Tricuspid regurgitation signal is inadequate for assessing  PA pressure.   3. The mitral valve is normal in structure. No evidence of mitral valve  regurgitation. No evidence of mitral stenosis.   4. The aortic valve is tricuspid. Aortic valve regurgitation is not  visualized.   5. The inferior vena cava is normal in size with <50% respiratory  variability, suggesting right atrial pressure of 8 mmHg.     Neuro/Psych  Headaches PSYCHIATRIC DISORDERS  Depression       GI/Hepatic Neg liver ROS,GERD  ,,  Endo/Other  diabetes, Type 2Hypothyroidism  Class 3 obesity (BMI 46)  Renal/GU Renal InsufficiencyRenal disease  negative genitourinary   Musculoskeletal  (+) Arthritis ,    Abdominal   Peds  Hematology negative hematology ROS (+)   Anesthesia Other Findings    Reproductive/Obstetrics                              Anesthesia Physical Anesthesia Plan  ASA: 3  Anesthesia Plan: General   Post-op Pain Management: Tylenol  PO (pre-op)*   Induction: Intravenous  PONV Risk Score and Plan: 4 or greater and Ondansetron , Dexamethasone  and Treatment may vary due to age or medical condition  Airway Management Planned: LMA  Additional Equipment:   Intra-op Plan:   Post-operative Plan: Extubation in OR  Informed Consent: I have reviewed the patients History and Physical, chart, labs and discussed the procedure including the risks, benefits and alternatives for the proposed anesthesia with the patient or authorized representative who has indicated his/her understanding and acceptance.     Dental advisory given  Plan Discussed with: CRNA  Anesthesia Plan Comments: (See PAT note from 6/9)         Anesthesia Quick Evaluation

## 2024-03-05 NOTE — Progress Notes (Signed)
 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 She has routine surveillance cystoscopy/ureteroscopy due to recurrent UCC. Last was on 11/02/23. No complications noted.  Patient follows with pulmonology for COPD, lung cancer, possible OSA.  Last seen in November 2024.  She is using inhalers.  Sleep study has been recommended however patient has postponed due to other pressing health issues.  Advised to return in 1 year.  Seen by PCP on 02/16/24. All issues stable.  LD Semaglutide: 7/4 LD Farxiga: 7/12  VS: BP (!) 105/50 Comment: left arm sitting  Pulse 76 Comment: left arm sitting  Temp 36.7 C (Oral)   Resp 20   Ht 5' 5 (1.651 m)   Wt 124.7 kg   SpO2 95%   BMI 45.76 kg/m   PROVIDERS: Theophilus Andrews, Tully GRADE, MD   LABS: Labs reviewed: Acceptable for surgery. Kidney function stable (all labs ordered are listed, but only abnormal results are displayed)  Labs Reviewed  COMPREHENSIVE METABOLIC PANEL WITH GFR - Abnormal; Notable for the following components:      Result Value   Glucose, Bld 134 (*)    BUN 37 (*)    Creatinine, Ser 1.73 (*)    Total Bilirubin 1.4 (*)    GFR, Estimated 30 (*)     All other components within normal limits  GLUCOSE, CAPILLARY - Abnormal; Notable for the following components:   Glucose-Capillary 155 (*)    All other components within normal limits  CBC     IMAGES: CT Chest 12/21/23:  IMPRESSION: 1. Stable treated right upper lobe suprahilar nodule. 2. Stable 4 mm and 5 mm right upper lobe nodules. 3. No new nodules or adenopathy. 4. Emphysema. 5. Aortic and coronary artery atherosclerosis. 6. Cholelithiasis. 7. Hepatic steatosis and cyst.   Aortic Atherosclerosis (ICD10-I70.0) and Emphysema (ICD10-J43.9).  EKG:   CV: Echo 02/11/23:  IMPRESSIONS    1. Left ventricular ejection fraction, by estimation, is 60 to 65%. The left ventricle has normal function. The left ventricle has no regional wall motion abnormalities. There is mild concentric left ventricular hypertrophy. Left ventricular diastolic parameters are consistent with Grade I diastolic dysfunction (impaired relaxation).  2. Right ventricular systolic function is normal. The right ventricular size is normal. Tricuspid regurgitation signal is inadequate for assessing PA pressure.  3. The mitral valve is normal in structure. No evidence of mitral valve regurgitation. No evidence of mitral stenosis.  4. The aortic valve is tricuspid. Aortic valve regurgitation is not visualized.  5. The inferior vena cava is normal in size with <50% respiratory variability, suggesting right atrial pressure of 8 mmHg.  Comparison(s): No prior Echocardiogram. Past Medical History:  Diagnosis Date   Anemia associated with chemotherapy    followed by  dr federico   Carcinoma of renal pelvis, right [facility]) 03/2020   urologist-- dr winter/  oncologist-- dr federico;   dx 08/ 2021 ;  chemo 10/ 2021  to 12/ 2021 and complete laser tumor ablation;  recurrent 05/ 2023   completed 6 mitomyocin gel infusions via nephrostomy tube 08/ 2023   Chronic kidney disease, stage 3a (HCC) 12/31/2019   nephrologist---  dr s. macel;  due to chemotherapy   Chronic rhinitis    w/ upper airway wheezing due to nasal drainage per pulmonogy note (dr meade),  using stiolto inhaler   Complication of anesthesia    affects memory for a few days   Diabetes mellitus type 2, diet-controlled (HCC)    followed by pcp;    (04-20-2023 pt has started take farxiga daily;    per pt check blood sugar multiple times daily w/ Libre,  fasting average 120--125   Dyspnea on exertion    04-20-2023 pt stated sob w/ long distancewalk and stairs but recovers quickly when stop/ rest,  ok with household chores and short walks   Family history of breast cancer 05/04/2021   Family history of ovarian cancer 05/04/2021   Generalized weakness    GERD (gastroesophageal reflux disease)    History of basal cell carcinoma (BCC) excision    per pt in 1970s on nose   History of chemotherapy    06-13-2020  to 12/ 2021  for right renal pelvis carcinoma   History of colon polyps    History of radiation therapy    11-13-2020 to 11-26-2020---   Right Lung- SBRT- Dr. Lynwood Nasuti   HLD (hyperlipidemia)    Hypertension    followed by pcp   Hypothyroidism, postsurgical 1979   followed by pcp   Leukocytosis    Nocturia    OA (osteoarthritis)    Pneumonia    PONV (postoperative nausea and vomiting)    Since chemo, ponv,   Port-A-Cath in place    Primary adenocarcinoma of upper lobe of right lung [facility]) 09/2020   oncologist--- dr dorsey/  pulmonology-- dr n. meade;  dx 02/ 2022;  s/p  SBRT 11-13-2020 to 11-26-2020   Urgency of urination    wears pads   Varicose veins of both legs with edema    Wears partial dentures    lower    Past Surgical History:  Procedure Laterality Date   COLONOSCOPY  last one 2017   CYSTOSCOPY W/ URETERAL STENT PLACEMENT Right 01/27/2022   Procedure: CYSTOSCOPY WITH RETROGRADE PYELOGRAM/ URETEROSCOPY/POSSIBLE BIOPSY/ POSSIBLE  URETERAL STENT PLACEMENT;  Surgeon: Devere Lonni Righter, MD;  Location: [facility];  Service: Urology;  Laterality: Right;   CYSTOSCOPY WITH BIOPSY Right 09/16/2021   Procedure: CYSTOSCOPY / URETEROSCOPY WITH BIOPSY/bugbee fulgeration/right retrograde;  Surgeon: Devere Lonni Righter, MD;  Location: [facility];  Service: Urology;  Laterality: Right;   CYSTOSCOPY WITH BIOPSY N/A 11/02/2023   Procedure: CYSTOSCOPY WITH BIOPSY;  Surgeon: Devere Lonni Righter, MD;  Location: [facility];  Service: Urology;  Laterality: N/A;   CYSTOSCOPY WITH RETROGRADE PYELOGRAM, URETEROSCOPY AND STENT PLACEMENT Right 04/27/2023   Procedure: CYSTOSCOPY, WITH RIGHT RETROGRADE PYELOGRAM, RIGHT URETEROSCOPY, RIGHT URETERAL STENT PLACEMENT, FULGURATION OF RIGHT RENAL PELVIS TUMORS;  Surgeon: Devere Lonni Righter, MD;  Location: [facility];  Service: Urology;  Laterality: Right;   CYSTOSCOPY WITH STENT PLACEMENT Right 10/01/2022   Procedure: CYSTOSCOPY WITH STENT PLACEMENT;  Surgeon: Devere Lonni Righter, MD;  Location: [facility];  Service: Urology;  Laterality: Right;   CYSTOSCOPY WITH URETEROSCOPY Right 04/18/2020   Procedure: CYSTOSCOPY WITH URETEROSCOPY, BIOPSY;  Surgeon: Devere Lonni Righter, MD;  Location: [facility];  Service: Urology;  Laterality: Right;  ONLY NEEDS 45 MIN   CYSTOSCOPY WITH URETEROSCOPY Right 06/02/2022   Procedure: CYSTOSCOPY WITH URETEROSCOPY;  Surgeon: Devere Lonni Righter, MD;  Location: [facility];  Service: Urology;  Laterality: Right;  ONLY NEEDS 60 MINS   CYSTOSCOPY WITH URETEROSCOPY AND STENT PLACEMENT N/A 12/31/2020   Procedure: CYSTOSCOPY WITH RIGHT URETEROSCOPY/ BILATERAL RETROGRADE PYELOGRAM/RIGHT URETERAL BIOPSY/ LASER ABLATION/RIGHT STENT PLACEMENT;  Surgeon: Devere Lonni Righter, MD;  Location[facility];  Service: Urology;  Laterality: N/A;   CYSTOSCOPY/RETROGRADE/URETEROSCOPY Right 04/22/2021   Procedure: CYSTOSCOPY/RETROGRADE/URETEROSCOPY/ STENT  PLACEMENT;  Surgeon: Devere Lonni Righter, MD;  Location: [facility];  Service: Urology;  Laterality: Right;   CYSTOSCOPY/URETEROSCOPY/HOLMIUM LASER/STENT PLACEMENT Right 11/02/2023   Procedure: CYSTOSCOPY/RIGHT URETEROSCOPY/RETROGRADE PYELOGRAM/ LASER ABLATION/;  Surgeon: Devere Lonni Righter, MD;  Location: [facility];  Service: Urology;  Laterality: Right;  45 MINUTES NEEDED   EYE SURGERY Bilateral 1987   tear ducts   IR IMAGING GUIDED PORT INSERTION  06/04/2020   IR NEPHROSTOMY PLACEMENT RIGHT  03/03/2022   THYROID  LOBECTOMY Right 1979   TOTAL HIP ARTHROPLASTY Right 01/28/2016   Procedure: RIGHT TOTAL HIP ARTHROPLASTY ANTERIOR APPROACH;  Surgeon: Dempsey Moan, MD;  Location: [facility];  Service: Orthopedics;  Laterality: Right;   UMBILICAL HERNIA REPAIR  1980s   URETEROSCOPY Right 10/01/2022   Procedure: URETEROSCOPY WITH BIOPSY pylegram;  Surgeon: Devere Lonni Righter, MD;  Location: [facility];  Service: Urology;  Laterality: Right;  45 MINS   VIDEO BRONCHOSCOPY WITH ENDOBRONCHIAL NAVIGATION N/A 06/06/2020   Procedure: VIDEO BRONCHOSCOPY WITH ENDOBRONCHIAL NAVIGATION;  Surgeon: Shelah Lamar RAMAN, MD;  Location: [facility];  Service: Thoracic;  Laterality: N/A;   VIDEO BRONCHOSCOPY WITH ENDOBRONCHIAL NAVIGATION N/A 10/10/2020   Procedure: VIDEO BRONCHOSCOPY WITH ENDOBRONCHIAL NAVIGATION;  Surgeon: Kerrin Elspeth BROCKS, MD;  Location: [facility];  Service: Thoracic;  Laterality: N/A;    MEDICATIONS:  acetaminophen  (TYLENOL ) 500 MG tablet   albuterol  (VENTOLIN  HFA) 108 (90 Base) MCG/ACT inhaler   atorvastatin  (LIPITOR) 20 MG tablet   Continuous Blood Gluc Receiver (FREESTYLE LIBRE 3 READER) DEVI   Continuous Glucose Sensor (FREESTYLE LIBRE 3 SENSOR) MISC   dapagliflozin propanediol (FARXIGA) 10 MG TABS tablet   EPINEPHRINE  0.3 mg/0.3 mL IJ SOAJ injection   estradiol -norethindrone  (ACTIVELLA) 1-0.5 MG tablet   ezetimibe  (ZETIA ) 10 MG tablet   fluticasone   (FLONASE ) 50 MCG/ACT nasal spray   furosemide  (LASIX ) 40 MG tablet   levothyroxine  (SYNTHROID ) 150 MCG tablet   lidocaine -prilocaine  (EMLA ) cream   losartan -hydrochlorothiazide  (HYZAAR) 100-25 MG tablet   ondansetron  (ZOFRAN -ODT) 8 MG disintegrating tablet   oxybutynin  (DITROPAN ) 5 MG tablet   pantoprazole  (PROTONIX ) 40 MG tablet   phenazopyridine  (PYRIDIUM ) 200 MG tablet   POTASSIUM CHLORIDE  PO   Semaglutide,0.25 or 0.5MG /DOS, 2 MG/1.5ML SOPN   Tiotropium Bromide-Olodaterol (STIOLTO RESPIMAT ) 2.5-2.5 MCG/ACT AERS   XIIDRA 5 % SOLN   No current facility-administered medications for this encounter.   Burnard CHRISTELLA Odis DEVONNA [facility] Short Stay/Anesthesiology [facility] Phone [phone] 03/05/2024 11:19 AM

## 2024-03-07 ENCOUNTER — Encounter (HOSPITAL_COMMUNITY)
Admission: RE | Disposition: A | Discharge status: Home / Self Care | Payer: Self-pay | Source: Home / Self Care | Attending: Urology

## 2024-03-07 ENCOUNTER — Ambulatory Visit (HOSPITAL_COMMUNITY)

## 2024-03-07 ENCOUNTER — Telehealth: Payer: Self-pay | Admitting: Hematology and Oncology

## 2024-03-07 ENCOUNTER — Encounter (HOSPITAL_COMMUNITY): Payer: Self-pay | Admitting: Urology

## 2024-03-07 ENCOUNTER — Ambulatory Visit (HOSPITAL_COMMUNITY)
Admission: RE | Admit: 2024-03-07 | Discharge: 2024-03-07 | Disposition: A | Discharge status: Home / Self Care | Attending: Urology | Admitting: Urology

## 2024-03-07 ENCOUNTER — Ambulatory Visit (HOSPITAL_COMMUNITY): Admitting: Anesthesiology

## 2024-03-07 ENCOUNTER — Ambulatory Visit (HOSPITAL_COMMUNITY): Payer: Self-pay | Admitting: Medical

## 2024-03-07 DIAGNOSIS — E1122 Type 2 diabetes mellitus with diabetic chronic kidney disease: Secondary | ICD-10-CM | POA: Insufficient documentation

## 2024-03-07 DIAGNOSIS — C674 Malignant neoplasm of posterior wall of bladder: Secondary | ICD-10-CM | POA: Diagnosis not present

## 2024-03-07 DIAGNOSIS — Z8551 Personal history of malignant neoplasm of bladder: Secondary | ICD-10-CM | POA: Diagnosis not present

## 2024-03-07 DIAGNOSIS — N2889 Other specified disorders of kidney and ureter: Secondary | ICD-10-CM | POA: Diagnosis not present

## 2024-03-07 DIAGNOSIS — F32A Depression, unspecified: Secondary | ICD-10-CM | POA: Insufficient documentation

## 2024-03-07 DIAGNOSIS — Z87891 Personal history of nicotine dependence: Secondary | ICD-10-CM

## 2024-03-07 DIAGNOSIS — N200 Calculus of kidney: Secondary | ICD-10-CM | POA: Insufficient documentation

## 2024-03-07 DIAGNOSIS — C651 Malignant neoplasm of right renal pelvis: Secondary | ICD-10-CM | POA: Insufficient documentation

## 2024-03-07 DIAGNOSIS — Z9221 Personal history of antineoplastic chemotherapy: Secondary | ICD-10-CM | POA: Insufficient documentation

## 2024-03-07 DIAGNOSIS — R0602 Shortness of breath: Secondary | ICD-10-CM | POA: Diagnosis not present

## 2024-03-07 DIAGNOSIS — F17200 Nicotine dependence, unspecified, uncomplicated: Secondary | ICD-10-CM | POA: Insufficient documentation

## 2024-03-07 DIAGNOSIS — D494 Neoplasm of unspecified behavior of bladder: Secondary | ICD-10-CM | POA: Diagnosis not present

## 2024-03-07 DIAGNOSIS — I129 Hypertensive chronic kidney disease with stage 1 through stage 4 chronic kidney disease, or unspecified chronic kidney disease: Secondary | ICD-10-CM | POA: Insufficient documentation

## 2024-03-07 DIAGNOSIS — E1142 Type 2 diabetes mellitus with diabetic polyneuropathy: Secondary | ICD-10-CM

## 2024-03-07 DIAGNOSIS — Z85118 Personal history of other malignant neoplasm of bronchus and lung: Secondary | ICD-10-CM | POA: Insufficient documentation

## 2024-03-07 DIAGNOSIS — R519 Headache, unspecified: Secondary | ICD-10-CM | POA: Insufficient documentation

## 2024-03-07 DIAGNOSIS — Z7985 Long-term (current) use of injectable non-insulin antidiabetic drugs: Secondary | ICD-10-CM | POA: Insufficient documentation

## 2024-03-07 DIAGNOSIS — Z923 Personal history of irradiation: Secondary | ICD-10-CM | POA: Insufficient documentation

## 2024-03-07 DIAGNOSIS — E039 Hypothyroidism, unspecified: Secondary | ICD-10-CM | POA: Insufficient documentation

## 2024-03-07 DIAGNOSIS — D090 Carcinoma in situ of bladder: Secondary | ICD-10-CM | POA: Diagnosis not present

## 2024-03-07 DIAGNOSIS — E66813 Obesity, class 3: Secondary | ICD-10-CM | POA: Diagnosis not present

## 2024-03-07 DIAGNOSIS — Z8553 Personal history of malignant neoplasm of renal pelvis: Secondary | ICD-10-CM | POA: Diagnosis not present

## 2024-03-07 DIAGNOSIS — Z79899 Other long term (current) drug therapy: Secondary | ICD-10-CM | POA: Insufficient documentation

## 2024-03-07 DIAGNOSIS — Z833 Family history of diabetes mellitus: Secondary | ICD-10-CM | POA: Diagnosis not present

## 2024-03-07 DIAGNOSIS — I10 Essential (primary) hypertension: Secondary | ICD-10-CM | POA: Diagnosis not present

## 2024-03-07 DIAGNOSIS — N1831 Chronic kidney disease, stage 3a: Secondary | ICD-10-CM | POA: Diagnosis not present

## 2024-03-07 DIAGNOSIS — M1990 Unspecified osteoarthritis, unspecified site: Secondary | ICD-10-CM | POA: Insufficient documentation

## 2024-03-07 DIAGNOSIS — N189 Chronic kidney disease, unspecified: Secondary | ICD-10-CM | POA: Insufficient documentation

## 2024-03-07 DIAGNOSIS — K219 Gastro-esophageal reflux disease without esophagitis: Secondary | ICD-10-CM | POA: Insufficient documentation

## 2024-03-07 DIAGNOSIS — Z6841 Body Mass Index (BMI) 40.0 and over, adult: Secondary | ICD-10-CM | POA: Insufficient documentation

## 2024-03-07 DIAGNOSIS — C661 Malignant neoplasm of right ureter: Secondary | ICD-10-CM | POA: Diagnosis present

## 2024-03-07 DIAGNOSIS — Z01818 Encounter for other preprocedural examination: Secondary | ICD-10-CM

## 2024-03-07 DIAGNOSIS — D49511 Neoplasm of unspecified behavior of right kidney: Secondary | ICD-10-CM | POA: Diagnosis not present

## 2024-03-07 HISTORY — PX: CYSTOSCOPY/URETEROSCOPY/HOLMIUM LASER/STENT PLACEMENT: SHX6546

## 2024-03-07 HISTORY — PX: CYSTOSCOPY WITH BIOPSY: SHX5122

## 2024-03-07 LAB — GLUCOSE, CAPILLARY
Glucose-Capillary: 159 mg/dL — ABNORMAL HIGH (ref 70–99)
Glucose-Capillary: 114 mg/dL — ABNORMAL HIGH (ref 70–99)

## 2024-03-07 SURGERY — CYSTOSCOPY/URETEROSCOPY/HOLMIUM LASER/STENT PLACEMENT
Anesthesia: General | Laterality: Right

## 2024-03-07 MED ORDER — PROPOFOL 1000 MG/100ML IV EMUL
INTRAVENOUS | Status: AC
  Filled 2024-03-07: qty 100

## 2024-03-07 MED ORDER — PHENYLEPHRINE 80 MCG/ML (10ML) SYRINGE FOR IV PUSH (FOR BLOOD PRESSURE SUPPORT)
PREFILLED_SYRINGE | INTRAVENOUS | Status: AC
  Filled 2024-03-07: qty 10

## 2024-03-07 MED ORDER — MIDAZOLAM HCL 2 MG/2ML IJ SOLN
INTRAMUSCULAR | Status: AC
  Filled 2024-03-07: qty 2

## 2024-03-07 MED ORDER — CHLORHEXIDINE GLUCONATE 0.12 % MT SOLN
15.0000 mL | Freq: Once | OROMUCOSAL | Status: AC
  Administered 2024-03-07: 15 mL via OROMUCOSAL

## 2024-03-07 MED ORDER — PROPOFOL 10 MG/ML IV BOLUS
INTRAVENOUS | Status: DC | PRN
  Administered 2024-03-07: 50 mg via INTRAVENOUS
  Administered 2024-03-07: 120 mg via INTRAVENOUS
  Administered 2024-03-07: 50 mg via INTRAVENOUS
  Administered 2024-03-07: 80 mg via INTRAVENOUS

## 2024-03-07 MED ORDER — FENTANYL CITRATE (PF) 100 MCG/2ML IJ SOLN
INTRAMUSCULAR | Status: DC | PRN
  Administered 2024-03-07 (×2): 25 ug via INTRAVENOUS
  Administered 2024-03-07: 50 ug via INTRAVENOUS

## 2024-03-07 MED ORDER — INSULIN ASPART 100 UNIT/ML IJ SOLN: 0.0000 [IU] | INTRAMUSCULAR | Status: DC | PRN

## 2024-03-07 MED ORDER — ONDANSETRON HCL 4 MG/2ML IJ SOLN
INTRAMUSCULAR | Status: DC | PRN
  Administered 2024-03-07: 4 mg via INTRAVENOUS

## 2024-03-07 MED ORDER — OXYCODONE HCL 5 MG PO TABS: 5.0000 mg | ORAL_TABLET | Freq: Once | ORAL | Status: DC | PRN

## 2024-03-07 MED ORDER — EPHEDRINE SULFATE (PRESSORS) 50 MG/ML IJ SOLN
INTRAMUSCULAR | Status: DC | PRN
  Administered 2024-03-07: 5 mg via INTRAVENOUS
  Administered 2024-03-07: 10 mg via INTRAVENOUS
  Administered 2024-03-07 (×2): 5 mg via INTRAVENOUS

## 2024-03-07 MED ORDER — MIDAZOLAM HCL 5 MG/5ML IJ SOLN
INTRAMUSCULAR | Status: DC | PRN
  Administered 2024-03-07: 1 mg via INTRAVENOUS

## 2024-03-07 MED ORDER — LACTATED RINGERS IV SOLN: INTRAVENOUS | Status: DC

## 2024-03-07 MED ORDER — IOHEXOL 300 MG/ML  SOLN
INTRAMUSCULAR | Status: DC | PRN
Start: 2024-03-07 — End: 2024-03-07
  Administered 2024-03-07: 18 mL via URETHRAL

## 2024-03-07 MED ORDER — DEXAMETHASONE SODIUM PHOSPHATE 4 MG/ML IJ SOLN
INTRAMUSCULAR | Status: DC | PRN
  Administered 2024-03-07: 8 mg via INTRAVENOUS

## 2024-03-07 MED ORDER — ONDANSETRON HCL 4 MG/2ML IJ SOLN
INTRAMUSCULAR | Status: AC
  Filled 2024-03-07: qty 2

## 2024-03-07 MED ORDER — PHENYLEPHRINE HCL (PRESSORS) 10 MG/ML IV SOLN
INTRAVENOUS | Status: DC | PRN
  Administered 2024-03-07 (×4): 160 ug via INTRAVENOUS
  Administered 2024-03-07: 80 ug via INTRAVENOUS

## 2024-03-07 MED ORDER — PROPOFOL 10 MG/ML IV BOLUS
INTRAVENOUS | Status: AC
  Filled 2024-03-07: qty 20

## 2024-03-07 MED ORDER — FENTANYL CITRATE PF 50 MCG/ML IJ SOSY: 25.0000 ug | PREFILLED_SYRINGE | INTRAMUSCULAR | Status: DC | PRN

## 2024-03-07 MED ORDER — SODIUM CHLORIDE 0.9 % IR SOLN
Status: DC | PRN
  Administered 2024-03-07: 3000 mL via INTRAVESICAL

## 2024-03-07 MED ORDER — PHENYLEPHRINE HCL-NACL 20-0.9 MG/250ML-% IV SOLN
INTRAVENOUS | Status: AC
  Filled 2024-03-07: qty 250

## 2024-03-07 MED ORDER — FENTANYL CITRATE (PF) 100 MCG/2ML IJ SOLN
INTRAMUSCULAR | Status: AC
  Filled 2024-03-07: qty 2

## 2024-03-07 MED ORDER — AMISULPRIDE (ANTIEMETIC) 5 MG/2ML IV SOLN
10.0000 mg | Freq: Once | INTRAVENOUS | Status: AC | PRN
  Administered 2024-03-07: 10 mg via INTRAVENOUS

## 2024-03-07 MED ORDER — OXYCODONE HCL 5 MG/5ML PO SOLN: 5.0000 mg | Freq: Once | ORAL | Status: DC | PRN

## 2024-03-07 MED ORDER — AMISULPRIDE (ANTIEMETIC) 5 MG/2ML IV SOLN
INTRAVENOUS | Status: AC
  Filled 2024-03-07: qty 4

## 2024-03-07 MED ORDER — ORAL CARE MOUTH RINSE: 15.0000 mL | Freq: Once | OROMUCOSAL | Status: AC

## 2024-03-07 MED ORDER — ACETAMINOPHEN 500 MG PO TABS
1000.0000 mg | ORAL_TABLET | Freq: Once | ORAL | Status: AC
  Administered 2024-03-07: 1000 mg via ORAL
  Filled 2024-03-07: qty 2

## 2024-03-07 MED ORDER — EPHEDRINE 5 MG/ML INJ
INTRAVENOUS | Status: AC
  Filled 2024-03-07: qty 5

## 2024-03-07 MED ORDER — LIDOCAINE HCL (CARDIAC) PF 100 MG/5ML IV SOSY
PREFILLED_SYRINGE | INTRAVENOUS | Status: DC | PRN
  Administered 2024-03-07: 80 mg via INTRAVENOUS

## 2024-03-07 MED ORDER — CIPROFLOXACIN IN D5W 400 MG/200ML IV SOLN
400.0000 mg | Freq: Two times a day (BID) | INTRAVENOUS | Status: DC
  Administered 2024-03-07: 400 mg via INTRAVENOUS
  Filled 2024-03-07 (×2): qty 200

## 2024-03-07 SURGICAL SUPPLY — 22 items
BAG URINE DRAIN 2000ML AR STRL (UROLOGICAL SUPPLIES) IMPLANT
BAG URO CATCHER STRL LF (MISCELLANEOUS) ×1 IMPLANT
BASKET ZERO TIP NITINOL 2.4FR (BASKET) IMPLANT
CATH URETL OPEN 5X70 (CATHETERS) ×1 IMPLANT
CLOTH BEACON ORANGE TIMEOUT ST (SAFETY) ×1 IMPLANT
DRAPE FOOT SWITCH (DRAPES) ×1 IMPLANT
ELECT REM PT RETURN 15FT ADLT (MISCELLANEOUS) ×1 IMPLANT
EXTRACTOR STONE NITINOL NGAGE (UROLOGICAL SUPPLIES) IMPLANT
FIBER LASER MOSES 200 DFL (Laser) IMPLANT
GLOVE SURG LX STRL 8.0 MICRO (GLOVE) ×1 IMPLANT
GOWN STRL SURGICAL XL XLNG (GOWN DISPOSABLE) ×1 IMPLANT
GUIDEWIRE STR DUAL SENSOR (WIRE) IMPLANT
GUIDEWIRE ZIPWRE .038 STRAIGHT (WIRE) ×1 IMPLANT
KIT TURNOVER KIT A (KITS) ×1 IMPLANT
LOOP CUT BIPOLAR 24F LRG (ELECTROSURGICAL) IMPLANT
MANIFOLD NEPTUNE II (INSTRUMENTS) ×1 IMPLANT
PACK CYSTO (CUSTOM PROCEDURE TRAY) ×1 IMPLANT
SHEATH NAVIGATOR HD 11/13X36 (SHEATH) IMPLANT
STENT URET 6FRX26 CONTOUR (STENTS) IMPLANT
SYRINGE TOOMEY IRRIG 70ML (MISCELLANEOUS) IMPLANT
TUBING CONNECTING 10 (TUBING) ×1 IMPLANT
TUBING UROLOGY SET (TUBING) ×1 IMPLANT

## 2024-03-07 NOTE — Transfer of Care (Signed)
 Immediate Anesthesia Transfer of Care Note  Patient: Maria Marquez  Procedure(s) Performed: CYSTOSCOPY/URETEROSCOPY/HOLMIUM LASER TUMOR ABALTION/STENT PLACEMENT/TURBT (Right) CYSTOSCOPY, WITH BIOPSY (Right)  Patient Location: PACU  Anesthesia Type:General  Level of Consciousness: awake, alert , and oriented  Airway & Oxygen Therapy: Patient Spontanous Breathing and Patient connected to face mask oxygen  Post-op Assessment: Report given to RN and Post -op Vital signs reviewed and stable  Post vital signs: Reviewed and stable  Last Vitals:  Vitals Value Taken Time  BP    Temp    Pulse    Resp    SpO2      Last Pain:  Vitals:   03/07/24 0910  TempSrc:   PainSc: 0-No pain         Complications: No notable events documented.

## 2024-03-07 NOTE — Anesthesia Postprocedure Evaluation (Signed)
 Anesthesia Post Note  Patient: Inocente LULLA Acton  Procedure(s) Performed: CYSTOSCOPY/URETEROSCOPY/HOLMIUM LASER TUMOR ABALTION/STENT PLACEMENT/TURBT (Right) CYSTOSCOPY, WITH BIOPSY (Right)     Patient location during evaluation: PACU Anesthesia Type: General Level of consciousness: awake and alert Pain management: pain level controlled Vital Signs Assessment: post-procedure vital signs reviewed and stable Respiratory status: spontaneous breathing, nonlabored ventilation, respiratory function stable and patient connected to nasal cannula oxygen Cardiovascular status: blood pressure returned to baseline and stable Postop Assessment: no apparent nausea or vomiting Anesthetic complications: no   No notable events documented.  Last Vitals:  Vitals:   03/07/24 1206 03/07/24 1215  BP: (!) 140/65 136/63  Pulse: 76 69  Resp: 16   Temp: 36.9 C   SpO2: 92% 94%    Last Pain:  Vitals:   03/07/24 1206  TempSrc: Oral  PainSc: 0-No pain                 Rochella Benner L Junette Bernat

## 2024-03-07 NOTE — H&P (Signed)
 Office Visit Report     02/13/2024   --------------------------------------------------------------------------------   Maria Marquez  MRN: [id number]  DOB: 13-Aug-1946, 78 year old Female  SSN:   PRIMARY CARE:  Tully GRADE. Theophilus Andrews, MD  PRIMARY CARE FAX:  [phone]  REFERRING:  Lonni LABOR. Devere, MD  PROVIDER:  Steffan Pea, M.D.  TREATING:  Lonni Devere, M.D.  LOCATION:  [facility] [phone]     --------------------------------------------------------------------------------   CC/HPI: Right ureteral UCC   HPI: Ms. Braddy is a 78 year old female, referred by Dr. Gaston after she was found to have a 2.7 cm solid and enhancing lesion in the proximal aspects of the right ureter on CT during a work-up for gross hematuria. She was also noted to have a 13 mm left renal lesion, likely representing a proteinaceous/hyperdense cyst. She is s/p diagnostic right ureteroscopy on 04/18/20 and was found to have multiple large papillary tumors throughout the right renal pelvis/UPJ. 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 She is s/p gem/cis chemotherapy and radiation to the thorax. Negative cysto/ureteroscopy on 09/16/21. She underwent surveillance cystoscopy/ureteroscopy on 01/27/2022 and was found to have multiple small papillary bladder tumors within the right renal pelvis, right ureter and bladder. All identifiable tumor burden was fulgurated at that time. 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 Follow-up surveillance ureteroscopy in February 2024 showed small mucosal abnormalities in the right distal ureter and renal pelvis that were biopsied and ultimately found to be negative for UCC recurrence.   11/24/20: The patient is here today for a routine follow-up. She has completed chemotherapy and is near completion of  radiation therapy for her primary lung cancer. PET CT from January revealed no intra-abdominal lesions and no GU abnormalities. She is urinating without difficulty and denies interval UTIs, dysuria or hematuria. Of note, since starting chemotherapy, her renal function has progressively declined with her last eGFR being 38 (serum creatinine- 1.45).   01/16/21: The patient is here today for a routine follow-up and stent removal. She is s/p right URS with ablation of a 1-2 cm papillary UCC on 12/31/20. Pathology showed low grade, detached UCC. She has done well post-op with her only complaint being urinary urgency and frequency q 2 hours, which were present months prior to surgery. She denies interval UTIs, fever/chills or significant flank pain.   07/23/21: The patient is here today for a routine follow-up and to plan her next surveillance ureteroscopy. Doing well and reports no new major health issues. Urge incontinence has worsened--wearing 3-4 ppd. Frequency q 2-2.5 hours. Minimal SUI sxs. Denies interval UTIs, dysuria or hematuria.   09/02/2021: 78 year old female who presents today for preoperative appointment. She denies any changes to her voiding habits, shortness of breath, chest pain, changes to her medications. She has been having frequent nosebleeds and does endorse that they can be quite severe at times. She has not seen an ENT for this. She reports her blood pressures have been at her normal.. She has no questions today regarding her upcoming ureteroscopy. She denies interval UTIs, dysuria, hematuria.   12/17/21: The patient is here today for a routine follow-up. She denies interval episodes of hematuria or dysuria. She does report some urinary hesitancy, but is not bothered by it. Her daughter is currently being treated for refractory metastatic ovarian cancer and this is weighing heavily on her.   02/03/2022: The patient is  here today for cystoscopy and right ureteral stent removal following  fulguration of her right renal pelvis and ureteral tumor burden. She has done well since surgery denies fever/chills or general malaise. I spoke with the GU tumor board who recommended continued local therapy and deferred nephroureterectomy, especially given her poor renal function.   04/29/22: The patient is here today after completing a 6-week course of right renal pelvis Jelmyto  infusions, which she tolerated well. She reports stable, nonbothersome right flank discomfort around her tube insertion site. She denies interval episodes of dysuria or hematuria.   09/06/22: The patient is here today for a routine follow-up. She is recovering from a bout of laryngitis, but states that she is improving. She is doing well and denies interval episodes of flank pain.   10/15/2022: The patient is here today for a routine follow-up and stent removal following her diagnostic ureteroscopy. She was found to have 2 suspicious areas within the ureter that ultimately resulted as dysplasia following biopsy. She has struggled with her stent over the last 2 weeks and notes intermittent episodes of fairly severe right-sided flank pain and urinary urgency. She denies gross hematuria, nausea/vomiting or fever/chills.   12/27/2022: The patient presents back today after she noticed dust in her urine and has had low back pain and symptoms of UTI for the past week. UA at her PCPs office showed no concerning features as is her UA today. She denies any gross hematuria or isolated flank pain. She also denies nausea/vomiting or fever/chills. Ureteroscopy back in February showed no evidence of UCC recurrence.   04/14/2023: The patient is here today for routine follow-up. She has done reasonably well over the past several months. She continues to have intermittent, sporadic episodes of right-sided flank pain with no specific aggravating or alleviating factors. She denies fever/chills, dysuria or hematuria. No new major health issues.    05/04/23: The patient is here today for stent removal following surveillance ureteroscopy on 04/27/23, which found multiple 3-4 mm papillary tumors in the right renal pelvis that were subsequently fulgurated. She states that she has not tolerated her stent well as it has caused significant urinary frequency q 30 min and urgency.   09/01/23: The patient is here today for a routine follow-up. She recently lost her daughter Randine after a prolonged bout with GYN cancer and is appropriately grieving. She does not report any episodes of right-sided flank pain or interval UTIs since her last visit.   11/10/2023: The patient presents today for routine follow-up after her surveillance ureteroscopy.SABRA She was found to have low-grade urothelial carcinoma involving the right renal pelvis (on washings) as well as a TA urothelial carcinoma the bladder. She has done well following surgery and denies interval episodes of flank pain, dysuria or hematuria.   02/13/24: The patient is here today for a routine follow-up to schedule her next surveillance ureteroscopy. She has done well from a GU standpoint and denies interval episodes of flank pain, dysuria, hematuria or UTIs.     ALLERGIES: Cephalexin  IVP Dye - Trouble Breathing, Other Reaction, Tightness in throat Shellfish    MEDICATIONS: oxyBUTYnin  Chloride 5 MG Tablet 1 tablet PO TID PRN  Simvastatin  10 MG Tablet  Estradiol -Norethindrone  Acet 1-0.5 MG Tablet  Ezetimibe  10 MG Tablet  Farxiga 10 MG Tablet  Levothyroxine  Sodium 137 MCG Capsule  Losartan  Potassium-HCTZ 100-25 MG Tablet tablet  Ozempic (0.25 or 0.5 MG/DOSE) 2 MG/3ML Solution Pen-injector     GU PSH: Cysto Bladder Ureth Biopsy - 2023, 2023 Cysto  Remove Stent FB Sim - 05/04/2023, 10/15/2022, 2023, 2022 Cysto Uretero Biopsy Fulgura - 10/01/2022, Right - 2021 Cysto Uretero W/excise Tumor, Right - 2022 Cystoscopy - 2021 Cystoscopy Insert Stent - 2023 Cystoscopy Ureteroscopy - 06/02/2022, 2023,  2022 Instll Rx Agnt Into Rnal Tub - 04/14/2022, 04/07/2022, 03/31/2022, 03/24/2022, 03/17/2022, 03/10/2022 Locm 300-399Mg /Ml Iodine,1Ml - 2021     NON-GU PSH: Hip Replacement, Right - 2017 Thyroid  Surgery, thyroid  lobe removal - 1979 Visit Complexity (formerly GPC1X) - 09/01/2023, 04/14/2023, 10/15/2022     GU PMH: Bladder Cancer Trigone - 11/10/2023, - 04/14/2022, - 03/24/2022, - 03/17/2022, - 02/17/2022, - 2023 Renal pelvis cancer, right - 11/10/2023, - 09/01/2023, - 05/04/2023, - 04/14/2023, - 12/27/2022, - 10/15/2022, - 09/06/2022, - 04/29/2022, - 04/14/2022, - 04/07/2022, - 03/24/2022, - 03/17/2022, - 03/10/2022, - 02/17/2022, - 2023, - 2023, - 2023, - 2022, - 2022, - 2022, - 2021, - 2021 Flank Pain - 04/14/2023 Right renal neoplasm - 03/31/2022, - 2022, - 2021 Urge incontinence - 2022 Microscopic hematuria - 2022 Nocturia (Stable) - 2022, - 2021 Urinary Urgency - 2022 Chronic kidney disease stage 3 (GFR 30-60) - 2022 Gross hematuria - 2021, (Stable), - 2021, - 2021 Hemorrhagic cystitis - 2021 Urinary Frequency - 2021    NON-GU PMH: Pyuria/other UA findings - 12/27/2022 Arthritis GERD Hypercholesterolemia Hypertension Hypothyroidism    FAMILY HISTORY: 1 Daughter - Daughter 1 son - Son Breast Cancer - Mother Diabetes - Father father deceased - Father mother deceased - Mother    Notes: Mother deceased at age 28 d/t breast cancer  Father deceased at age 30 d/t diabetes   SOCIAL HISTORY: Marital Status: Widowed Current Smoking Status: Patient smokes. Has smoked since 02/20/1990. Smokes 1/2 pack per day.   Tobacco Use Assessment Completed: Used Tobacco in last 30 days? Does not drink anymore.  Drinks 2 caffeinated drinks per day.    REVIEW OF SYSTEMS:    GU Review Female:   Patient denies frequent urination, hard to postpone urination, burning /pain with urination, get up at night to urinate, leakage of urine, stream starts and stops, trouble starting your stream, have to strain to urinate, and being  pregnant.  Gastrointestinal (Upper):   Patient denies nausea, vomiting, and indigestion/ heartburn.  Gastrointestinal (Lower):   Patient denies diarrhea and constipation.  Constitutional:   Patient denies fever, night sweats, weight loss, and fatigue.  Skin:   Patient denies skin rash/ lesion and itching.  Eyes:   Patient denies blurred vision and double vision.  Ears/ Nose/ Throat:   Patient denies sore throat and sinus problems.  Hematologic/Lymphatic:   Patient denies swollen glands and easy bruising.  Cardiovascular:   Patient denies leg swelling and chest pains.  Respiratory:   Patient denies cough and shortness of breath.  Endocrine:   Patient denies excessive thirst.  Musculoskeletal:   Patient denies back pain and joint pain.  Neurological:   Patient denies headaches and dizziness.  Psychologic:   Patient denies depression and anxiety.   VITAL SIGNS: None   MULTI-SYSTEM PHYSICAL EXAMINATION:    Constitutional: Well-nourished. No physical deformities. Normally developed. Good grooming.  Neurologic / Psychiatric: Oriented to time, oriented to place, oriented to person. No depression, no anxiety, no agitation.     Complexity of Data:  Records Review:   Pathology Reports, Previous Hospital Records, Previous Patient Records   PROCEDURES:          Visit Complexity - G2211          Urinalysis w/Scope Dipstick Dipstick Cont'd  Micro  Color: Yellow Bilirubin: Neg mg/dL WBC/hpf: 0 - 5/hpf  Appearance: Cloudy Ketones: Neg mg/dL RBC/hpf: 0 - 2/hpf  Specific Gravity: 1.025 Blood: 1+ ery/uL Bacteria: Few (10-25/hpf)  pH: 5.5 Protein: 1+ mg/dL Cystals: NS (Not Seen)  Glucose: 3+ mg/dL Urobilinogen: 0.2 mg/dL Casts: Hyaline    Nitrites: Neg Trichomonas: Not Present    Leukocyte Esterase: 1+ leu/uL Mucous: Not Present      Epithelial Cells: 0 - 5/hpf      Yeast: Few (1 - 5/hpf)      Sperm: Not Present    Notes: qns to spin    ASSESSMENT:      ICD-10 Details  1 GU:   Renal  pelvis cancer, 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 Risks, benefits and alternatives discussed.

## 2024-03-07 NOTE — Anesthesia Procedure Notes (Signed)
 Procedure Name: LMA Insertion Date/Time: 03/07/2024 10:13 AM  Performed by: Buster Catheryn SAUNDERS, CRNAPre-anesthesia Checklist: Patient identified, Emergency Drugs available, Suction available and Patient being monitored Patient Re-evaluated:Patient Re-evaluated prior to induction Oxygen Delivery Method: Circle system utilized Preoxygenation: Pre-oxygenation with 100% oxygen Induction Type: IV induction Ventilation: Mask ventilation without difficulty LMA: LMA inserted LMA Size: 4.0 Number of attempts: 1 Placement Confirmation: positive ETCO2 Tube secured with: Tape Dental Injury: Teeth and Oropharynx as per pre-operative assessment

## 2024-03-07 NOTE — Op Note (Signed)
 Operative Note  Preoperative diagnosis:  1.  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  Recurrent, multifocal 5 mm urothelial tumors involving the right renal pelvis and bladder  Procedure(s): 1.  Cystoscopy with right ureteroscopy, holmium laser lithotripsy and right renal pelvis tumor ablation 2.  TURBT (small) 3.  Bilateral retrograde pyelograms with intraoperative interpretation fluoroscopic imaging  Surgeon: Lonni Han, MD  Assistants:  None  Anesthesia:  General  Complications:  None  EBL: 5 mL  Specimens: 1.  Posterior bladder wall tumor  Drains/Catheters: 1.  None  Intraoperative findings:   A total of four, 5 mm papillary bladder tumors were seen involving the posterior bladder wall A total of two, 5 mm papillary tumors were seen involving the right renal pelvis 5 mm right renal stone Solitary left collecting system with no filling defects or dilation involving the left ureter or left renal pelvis seen on retrograde pyelogram Solitary right collecting system with no filling defects or dilation involving the right ureter or right renal pelvis seen on retrograde pyelogram  Indication:  Maria Marquez is a 78 y.o. 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  She is here today for surveillance cystoscopy ureteroscopy.  She has been consented for the above procedures, voices understanding and wishes to proceed.  Description of procedure:  After informed consent was obtained, the patient was brought to the operating room and general LMA anesthesia was administered. The patient was then placed in the dorsolithotomy position and prepped and draped in the usual sterile fashion. A timeout was performed. A 21 French rigid cystoscope was then inserted into the urethral meatus and advanced into the bladder under direct  vision. A complete bladder survey a total of four, 5 mm papillary bladder tumors involving the posterior bladder wall.  No other intravesical or urethral abnormalities were seen.  A 5 French ureteral catheter was then inserted into the left ureteral orifice and a retrograde pyelogram was obtained, with the findings listed above.  A 5 French ureteral catheter was then inserted into the right ureteral orifice and a retrograde pyelogram was obtained, with the findings listed above.  A Glidewire was then used to intubate the lumen of the ureteral catheter and was advanced up to the right renal pelvis, under fluoroscopic guidance.  The catheter was then removed, leaving the wire in place.  A flexible ureteroscope was then advanced up the right ureter alongside the Glidewire.  No luminal ureteral abnormalities were seen.  Full inspection of the right renal pelvis revealed a total of  two, 5 mm papillary bladder tumors involving the mid and lower pole calyces.  She also had a 5 mm stone in her lower pole calyx.  A stone basket was then used to reposition the lower pole stone into a midpole calyx.  A 200 m holmium laser was then used to fracture the stone into less than 2 mm fragments that were then extracted from the upper urinary tract using an engage basket.  The holmium laser was then also used to ablate the multiple 5 mm tumors identified within the right renal pelvis, achieving excellent hemostasis.  Due to the atraumatic nature of her ureteroscopy, made a decision not to leave a ureteral stent.  The flexible ureteroscope was then removed.  A 26 French resectoscope with a bipolar loop working element was then used to resect the multiple 5 mm tumors involving  the posterior bladder wall.  The excised specimens were then siphon out of the bladder through the sheath of the resectoscope and sent for permanent section.  The area of resection was then extensively fulgurated until hemostasis was achieved.  There was no  evidence of bladder perforation following TURBT.  The patient's bladder was drained.  She tolerated the procedure well and was transferred to the postanesthesia in stable condition.  Plan: Follow-up on 03/16/2024 for a postop visit

## 2024-03-08 ENCOUNTER — Ambulatory Visit: Payer: Self-pay | Admitting: *Deleted

## 2024-03-08 ENCOUNTER — Encounter (HOSPITAL_COMMUNITY): Payer: Self-pay | Admitting: Urology

## 2024-03-08 ENCOUNTER — Ambulatory Visit: Admitting: Internal Medicine

## 2024-03-08 ENCOUNTER — Inpatient Hospital Stay: Attending: Oncology

## 2024-03-08 VITALS — BP 128/48 | HR 83 | Temp 98.2°F | Resp 19

## 2024-03-08 DIAGNOSIS — Z85118 Personal history of other malignant neoplasm of bronchus and lung: Secondary | ICD-10-CM | POA: Diagnosis not present

## 2024-03-08 DIAGNOSIS — Z95828 Presence of other vascular implants and grafts: Secondary | ICD-10-CM

## 2024-03-08 DIAGNOSIS — Z452 Encounter for adjustment and management of vascular access device: Secondary | ICD-10-CM | POA: Insufficient documentation

## 2024-03-08 DIAGNOSIS — Z8553 Personal history of malignant neoplasm of renal pelvis: Secondary | ICD-10-CM | POA: Insufficient documentation

## 2024-03-08 LAB — SURGICAL PATHOLOGY

## 2024-03-08 MED ORDER — HEPARIN SOD (PORK) LOCK FLUSH 100 UNIT/ML IV SOLN
500.0000 [IU] | Freq: Once | INTRAVENOUS | Status: AC
Start: 2024-03-08 — End: 2024-03-08
  Administered 2024-03-08: 500 [IU]

## 2024-03-08 MED ORDER — SODIUM CHLORIDE 0.9% FLUSH
10.0000 mL | Freq: Once | INTRAVENOUS | Status: AC
Start: 2024-03-08 — End: 2024-03-08
  Administered 2024-03-08: 10 mL

## 2024-03-08 NOTE — Telephone Encounter (Signed)
 FYI Only or Action Required?: FYI only for provider.  Patient was last seen in primary care on 02/16/2024 by Theophilus Andrews, Tully GRADE, MD.  Called Nurse Triage reporting Hyperglycemia.  Symptoms began yesterday.  Interventions attempted: Rest, hydration, or home remedies.  Symptoms are: rapidly improving.  Triage Disposition: Home Care  Patient/caregiver understands and will follow disposition?: yes  Reason for Disposition  Blood glucose 70-240 mg/dL (3.9 -86.6 mmol/L)  Answer Assessment - Initial Assessment Questions Patient had surgery yesterday and her glucose has been elevated- it is finally down ans she feels that she does not need appointment - but is concerned about what to do to help her levels with next surgery.   1. BLOOD GLUCOSE: What is your blood glucose level?      150-7:30, 117- now 2. ONSET: When did you check the blood glucose?     Yesterday patient had surgery- she had elevated glucose- did not eat yesterday- 200/190, fasting 210 today 3. USUAL RANGE: What is your glucose level usually? (e.g., usual fasting morning value, usual evening value)     Fasting- tends to be high-115-125 4. KETONES: Do you check for ketones (urine or blood test strips)? If Yes, ask: What does the test show now?      na 5. TYPE 1 or 2:  Do you know what type of diabetes you have?  (e.g., Type 1, Type 2, Gestational; doesn't know)      Type 2 6. INSULIN : Do you take insulin ? What type of insulin (s) do you use? What is the mode of delivery? (syringe, pen; injection or pump)?      no 7. DIABETES PILLS: Do you take any pills for your diabetes? If Yes, ask: Have you missed taking any pills recently?     no 8. OTHER SYMPTOMS: Do you have any symptoms? (e.g., fever, frequent urination, difficulty breathing, dizziness, weakness, vomiting)     Frequent urination  Protocols used: Diabetes - High Blood Sugar-A-AH    Copied from CRM [id number]. Topic: Clinical - Red Word  Triage >> Mar 08, 2024  8:04 AM Willma SAUNDERS wrote: Red Word that prompted transfer to Nurse Triage: Patient states yesterday her Blood Sugar was over 200 and was still over this morning. Has started to come down to around 150 but she is unsure if she needs to come in. Scheduled herself online for 2:30 today. Patient also had surgery yesterday so didn't eat anything.

## 2024-03-09 LAB — CYTOLOGY - NON PAP

## 2024-03-12 ENCOUNTER — Ambulatory Visit (INDEPENDENT_AMBULATORY_CARE_PROVIDER_SITE_OTHER): Admitting: Podiatry

## 2024-03-12 DIAGNOSIS — B351 Tinea unguium: Secondary | ICD-10-CM | POA: Diagnosis not present

## 2024-03-12 DIAGNOSIS — M79675 Pain in left toe(s): Secondary | ICD-10-CM | POA: Diagnosis not present

## 2024-03-12 DIAGNOSIS — M79674 Pain in right toe(s): Secondary | ICD-10-CM | POA: Diagnosis not present

## 2024-03-12 NOTE — Progress Notes (Signed)
 Subjective:  Patient ID: Maria Marquez, female    DOB: [birth year],  MRN: [id number]  Maria Marquez presents to clinic today for:  Chief Complaint  Patient presents with   [facility]    Last A1c: 6.7. No anticoag. Nail care. No corns/callus. Doe shave questions about the yellowing of her nails.    Patient notes nails are thick, discolored, elongated and painful in shoegear when trying to ambulate.  She also notes some pain near the right great toe joint.  She was asked to make a separate appointment for this so we have plenty of time to perform x-rays and evaluate the joint.  Denies injury or bruising.  States the pain is not present all the time but sometimes it can be uncomfortable.  PCP is Theophilus Andrews, Tully GRADE, MD.  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 macel;  due to chemotherapy   Chronic rhinitis    w/ upper airway wheezing due to nasal drainage per pulmonogy note (dr meade),  using stiolto inhaler   Complication of anesthesia    affects memory for a few days   Diabetes mellitus type 2, diet-controlled (HCC)    followed by pcp;    (04-20-2023 pt has started take farxiga daily;    per pt check blood sugar multiple times daily w/ Libre,  fasting average 120--125   Dyspnea on exertion    04-20-2023 pt stated sob w/ long distancewalk and stairs but recovers quickly when stop/ rest,  ok with household chores and short walks   Family history of breast cancer 05/04/2021   Family history of ovarian cancer 05/04/2021   Generalized weakness    GERD (gastroesophageal reflux  disease)    History of basal cell carcinoma (BCC) excision    per pt in 1970s on nose   History of chemotherapy    06-13-2020  to 12/ 2021  for right renal pelvis carcinoma   History of colon polyps    History of radiation therapy    11-13-2020 to 11-26-2020---   Right Lung- SBRT- Dr. Lynwood Nasuti   HLD (hyperlipidemia)    Hypertension    followed by pcp   Hypothyroidism, postsurgical 1979   followed by pcp   Leukocytosis    Nocturia    OA (osteoarthritis)    Pneumonia    PONV (postoperative nausea and vomiting)    Since chemo, ponv,   Port-A-Cath in place    Primary adenocarcinoma of upper lobe of right lung [facility]) 09/2020   oncologist--- dr dorsey/  pulmonology-- dr n. meade;  dx 02/ 2022;  s/p  SBRT 11-13-2020 to 11-26-2020   Urgency of urination    wears pads   Varicose veins of both legs with edema    Wears partial dentures    lower   Past Surgical History:  Procedure Laterality Date   COLONOSCOPY  last one 2017   CYSTOSCOPY W/ URETERAL STENT PLACEMENT Right 01/27/2022   Procedure: CYSTOSCOPY WITH RETROGRADE PYELOGRAM/ URETEROSCOPY/POSSIBLE BIOPSY/ POSSIBLE  URETERAL STENT PLACEMENT;  Surgeon: Devere Lonni Righter, MD;  Location: [facility];  Service: Urology;  Laterality: Right;   CYSTOSCOPY WITH BIOPSY Right 09/16/2021   Procedure: CYSTOSCOPY / URETEROSCOPY WITH BIOPSY/bugbee fulgeration/right retrograde;  Surgeon: Devere Lonni Righter, MD;  Location: [facility];  Service: Urology;  Laterality: Right;   CYSTOSCOPY WITH BIOPSY N/A 11/02/2023   Procedure: CYSTOSCOPY WITH BIOPSY;  Surgeon: Devere Lonni Righter, MD;  Location: [facility];  Service: Urology;  Laterality: N/A;   CYSTOSCOPY WITH BIOPSY Right 03/07/2024   Procedure: CYSTOSCOPY, WITH BIOPSY;  Surgeon: Devere Lonni Righter, MD;  Location: [facility];  Service: Urology;  Laterality: Right;   CYSTOSCOPY WITH RETROGRADE PYELOGRAM, URETEROSCOPY AND STENT PLACEMENT Right 04/27/2023    Procedure: CYSTOSCOPY, WITH RIGHT RETROGRADE PYELOGRAM, RIGHT URETEROSCOPY, RIGHT URETERAL STENT PLACEMENT, FULGURATION OF RIGHT RENAL PELVIS TUMORS;  Surgeon: Devere Lonni Righter, MD;  Location: [facility];  Service: Urology;  Laterality: Right;   CYSTOSCOPY WITH STENT PLACEMENT Right 10/01/2022   Procedure: CYSTOSCOPY WITH STENT PLACEMENT;  Surgeon: Devere Lonni Righter, MD;  Location: [facility];  Service: Urology;  Laterality: Right;   CYSTOSCOPY WITH URETEROSCOPY Right 04/18/2020   Procedure: CYSTOSCOPY WITH URETEROSCOPY, BIOPSY;  Surgeon: Devere Lonni Righter, MD;  [facility];  Service: Urology;  Laterality: Right;  ONLY NEEDS 45 MIN   CYSTOSCOPY WITH URETEROSCOPY Right 06/02/2022   Procedure: CYSTOSCOPY WITH URETEROSCOPY;  Surgeon: Devere Lonni Righter, MD;  Location: [facility];  Service: Urology;  Laterality: Right;  ONLY NEEDS 60 MINS   CYSTOSCOPY WITH URETEROSCOPY AND STENT PLACEMENT N/A 12/31/2020   Procedure: CYSTOSCOPY WITH RIGHT URETEROSCOPY/ BILATERAL RETROGRADE PYELOGRAM/RIGHT URETERAL BIOPSY/ LASER ABLATION/RIGHT STENT PLACEMENT;  Surgeon: Devere Lonni Righter, MD;  Location: [facility];  Service: Urology;  Laterality: N/A;   CYSTOSCOPY/RETROGRADE/URETEROSCOPY Right 04/22/2021   Procedure: CYSTOSCOPY/RETROGRADE/URETEROSCOPY/ STENT PLACEMENT;  Surgeon: Devere Lonni Righter, MD;  Location: [facility];  Service: Urology;  Laterality: Right;   CYSTOSCOPY/URETEROSCOPY/HOLMIUM LASER/STENT PLACEMENT Right 11/02/2023   Procedure: CYSTOSCOPY/RIGHT URETEROSCOPY/RETROGRADE PYELOGRAM/ LASER ABLATION/;  Surgeon: Devere Lonni Righter, MD;  [facility];  Service: Urology;  Laterality: Right;  45 MINUTES NEEDED   CYSTOSCOPY/URETEROSCOPY/HOLMIUM LASER/STENT PLACEMENT Right 03/07/2024   Procedure: CYSTOSCOPY/URETEROSCOPY/HOLMIUM LASER TUMOR ABALTION/STENT PLACEMENT/TURBT;  Surgeon:  Devere Lonni Righter, MD;  [facility];  Service: Urology;  Laterality: Right;  CYSTOSCOPY WITH RIGHT RETROGRADE PYELOGRAM, URETEROSCOPY, POSSIBLE BIOPSY WITH LASER FULGURATION, POSSIBLE STENT PLACEMENT   EYE SURGERY Bilateral 1987   tear ducts   IR IMAGING GUIDED PORT INSERTION  06/04/2020   IR NEPHROSTOMY PLACEMENT RIGHT  03/03/2022   THYROID  LOBECTOMY Right 1979   TOTAL HIP ARTHROPLASTY Right 01/28/2016   Procedure: RIGHT TOTAL HIP ARTHROPLASTY ANTERIOR APPROACH;  Surgeon: Dempsey Moan, MD;  [facility];  Service: Orthopedics;  Laterality: Right;   UMBILICAL HERNIA REPAIR  1980s   URETEROSCOPY Right 10/01/2022   Procedure: URETEROSCOPY WITH BIOPSY pylegram;  Surgeon: Devere Lonni Righter, MD;  Location: [facility];  Service: Urology;  Laterality: Right;  45 MINS   VIDEO BRONCHOSCOPY WITH ENDOBRONCHIAL NAVIGATION N/A 06/06/2020   Procedure: VIDEO BRONCHOSCOPY WITH ENDOBRONCHIAL NAVIGATION;  Surgeon: Shelah Lamar RAMAN, MD;  [facility];  Service: Thoracic;  Laterality: N/A;   VIDEO BRONCHOSCOPY WITH ENDOBRONCHIAL NAVIGATION N/A 10/10/2020   Procedure: VIDEO BRONCHOSCOPY WITH ENDOBRONCHIAL NAVIGATION;  Surgeon: Kerrin Elspeth BROCKS, MD;  Location: [facility];  Service: Thoracic;  Laterality: N/A;   Allergies  Allergen Reactions   Iodine Shortness  Of Breath and Other (See Comments)   Keflex  [Cephalexin ] Shortness Of Breath, Rash and Tinitus   Shellfish Allergy Hives   Shellfish-Derived Products Anaphylaxis and Hives   Hydrocodone  Hives and Itching    Itching throat   Rosuvastatin  Other (See Comments)    Muscle Pain in Thighs   Ciprofloxacin  Other (See Comments)   Latex Rash   Lisinopril Cough   Sulfa Antibiotics Nausea Only   Tramadol  Other (See Comments)    hallucinations    Review of Systems: Negative except as noted in the HPI.  Objective:  Maria Marquez is a pleasant 78 y.o. female in NAD. AAO x 3.  Vascular Examination: Capillary refill time  is 3-5 seconds to toes bilateral. Palpable pedal pulses b/l LE. Digital hair present b/l.  Skin temperature gradient WNL b/l. No varicosities b/l. No cyanosis noted b/l.   Dermatological Examination: Pedal skin with normal turgor, texture and tone b/l. No open wounds. No interdigital macerations b/l. Toenails x10 are 3mm thick, discolored, dystrophic with subungual debris. There is pain with compression of the nail plates.  They are elongated x10     Latest Ref Rng & Units 02/16/2024    1:36 PM 10/31/2023   10:45 AM  Hemoglobin A1C  Hemoglobin-A1c 4.0 - 5.6 % 6.7  7.2    Assessment/Plan: 1. Pain due to onychomycosis of toenails of both feet    The mycotic toenails were sharply debrided x10 with sterile nail nippers and a power debriding burr to decrease bulk/thickness and length.    Return in about 3 months (around 06/12/2024) for [facility].   Awanda CHARM Imperial, DPM, FACFAS [facility]     [address] [name], [place] [address]                Office [phone]  Fax [phone]

## 2024-03-16 DIAGNOSIS — C651 Malignant neoplasm of right renal pelvis: Secondary | ICD-10-CM | POA: Diagnosis not present

## 2024-03-16 DIAGNOSIS — C670 Malignant neoplasm of trigone of bladder: Secondary | ICD-10-CM | POA: Diagnosis not present

## 2024-03-26 ENCOUNTER — Ambulatory Visit (INDEPENDENT_AMBULATORY_CARE_PROVIDER_SITE_OTHER): Admitting: Podiatry

## 2024-03-26 ENCOUNTER — Encounter: Payer: Self-pay | Admitting: Podiatry

## 2024-03-26 ENCOUNTER — Encounter: Payer: Self-pay | Admitting: Internal Medicine

## 2024-03-26 ENCOUNTER — Ambulatory Visit (INDEPENDENT_AMBULATORY_CARE_PROVIDER_SITE_OTHER)

## 2024-03-26 DIAGNOSIS — M19071 Primary osteoarthritis, right ankle and foot: Secondary | ICD-10-CM | POA: Diagnosis not present

## 2024-03-26 DIAGNOSIS — M7751 Other enthesopathy of right foot: Secondary | ICD-10-CM

## 2024-03-26 DIAGNOSIS — M25571 Pain in right ankle and joints of right foot: Secondary | ICD-10-CM

## 2024-03-26 DIAGNOSIS — M21611 Bunion of right foot: Secondary | ICD-10-CM | POA: Diagnosis not present

## 2024-03-26 DIAGNOSIS — M21619 Bunion of unspecified foot: Secondary | ICD-10-CM

## 2024-03-26 NOTE — Progress Notes (Unsigned)
 Chief Complaint  Patient presents with   [place]    Right foot 1st MPJ painful x 6-8 monthjs. 4 pain. Difficulty flexing toe. Pt. Used topical arthritis rub on it. Diet control diabetes. A1C 6.7.   HPI: 78 y.o. female presents today with concern of pain in the right great toe joint.  She also notes that it is stiff.  She is trying to avoid surgery.  She just got custom orthotics from our pedorthist a couple weeks ago.  She is interested in conservative options.  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 macel;  due to chemotherapy   Chronic rhinitis    w/ upper airway wheezing due to nasal drainage per pulmonogy note (dr meade),  using stiolto inhaler   Complication of anesthesia    affects memory for a few days   Diabetes mellitus type 2, diet-controlled (HCC)    followed by pcp;    (04-20-2023 pt has started take farxiga  daily;    per pt check blood sugar multiple times daily w/ Libre,  fasting average 120--125   Dyspnea on exertion    04-20-2023 pt stated sob w/ long distancewalk and stairs but recovers quickly when stop/ rest,  ok with household chores and short walks   Family history of breast cancer 05/04/2021   Family history of ovarian cancer 05/04/2021   Generalized weakness    GERD (gastroesophageal reflux disease)    History of basal cell carcinoma (BCC) excision    per pt in 1970s on nose   History of chemotherapy    06-13-2020  to 12/ 2021  for right renal pelvis carcinoma   History of colon polyps    History of radiation therapy    11-13-2020 to 11-26-2020---   Right  Lung- SBRT- Dr. Lynwood Nasuti   HLD (hyperlipidemia)    Hypertension    followed by pcp   Hypothyroidism, postsurgical 1979   followed by pcp   Leukocytosis    Nocturia    OA (osteoarthritis)    Pneumonia    PONV (postoperative nausea and vomiting)    Since chemo, ponv,   Port-A-Cath in place    Primary adenocarcinoma of upper lobe of right lung [facility]) 09/2020   oncologist--- dr dorsey/  pulmonology-- dr n. meade;  dx 02/ 2022;  s/p  SBRT 11-13-2020 to 11-26-2020   Urgency of urination    wears pads   Varicose veins of both legs with edema    Wears partial dentures    lower   Past Surgical History:  Procedure Laterality Date   COLONOSCOPY  last one 2017   CYSTOSCOPY W/ URETERAL STENT PLACEMENT Right 01/27/2022   Procedure: CYSTOSCOPY WITH RETROGRADE PYELOGRAM/ URETEROSCOPY/POSSIBLE BIOPSY/ POSSIBLE  URETERAL STENT PLACEMENT;  Surgeon: Devere Lonni Righter, MD;  Location: [facility];  Service: Urology;  Laterality: Right;   CYSTOSCOPY WITH BIOPSY Right 09/16/2021   Procedure: CYSTOSCOPY / URETEROSCOPY WITH BIOPSY/bugbee fulgeration/right retrograde;  Surgeon: Devere Lonni Righter, MD;  Location: [facility];  Service: Urology;  Laterality: Right;  CYSTOSCOPY WITH BIOPSY N/A 11/02/2023   Procedure: CYSTOSCOPY WITH BIOPSY;  Surgeon: Devere Lonni Righter, MD;  [facility];  Service: Urology;  Laterality: N/A;   CYSTOSCOPY WITH BIOPSY Right 03/07/2024   Procedure: CYSTOSCOPY, WITH BIOPSY;  Surgeon: Devere Lonni Righter, MD;  [facility];  Service: Urology;  Laterality: Right;   CYSTOSCOPY WITH RETROGRADE PYELOGRAM, URETEROSCOPY AND STENT PLACEMENT Right 04/27/2023   Procedure: CYSTOSCOPY, WITH RIGHT RETROGRADE PYELOGRAM, RIGHT URETEROSCOPY, RIGHT URETERAL STENT PLACEMENT, FULGURATION OF RIGHT RENAL PELVIS TUMORS;  Surgeon: Devere Lonni Righter, MD;  Location[facility];  Service: Urology;  Laterality: Right;    CYSTOSCOPY WITH STENT PLACEMENT Right 10/01/2022   Procedure: CYSTOSCOPY WITH STENT PLACEMENT;  Surgeon: Devere Lonni Righter, MD;  Location: [facility];  Service: Urology;  Laterality: Right;   CYSTOSCOPY WITH URETEROSCOPY Right 04/18/2020   Procedure: CYSTOSCOPY WITH URETEROSCOPY, BIOPSY;  Surgeon: Devere Lonni Righter, MD;  [facility];  Service: Urology;  Laterality: Right;  ONLY NEEDS 45 MIN   CYSTOSCOPY WITH URETEROSCOPY Right 06/02/2022   Procedure: CYSTOSCOPY WITH URETEROSCOPY;  Surgeon: Devere Lonni Righter, MD;  Location: [facility];  Service: Urology;  Laterality: Right;  ONLY NEEDS 60 MINS   CYSTOSCOPY WITH URETEROSCOPY AND STENT PLACEMENT N/A 12/31/2020   Procedure: CYSTOSCOPY WITH RIGHT URETEROSCOPY/ BILATERAL RETROGRADE PYELOGRAM/RIGHT URETERAL BIOPSY/ LASER ABLATION/RIGHT STENT PLACEMENT;  Surgeon: Devere Lonni Righter, MD;  Location: [facility];  Service: Urology;  Laterality: N/A;   CYSTOSCOPY/RETROGRADE/URETEROSCOPY Right 04/22/2021   Procedure: CYSTOSCOPY/RETROGRADE/URETEROSCOPY/ STENT PLACEMENT;  Surgeon: Devere Lonni Righter, MD;  Location: [facility];  Service: Urology;  Laterality: Right;   CYSTOSCOPY/URETEROSCOPY/HOLMIUM LASER/STENT PLACEMENT Right 11/02/2023   Procedure: CYSTOSCOPY/RIGHT URETEROSCOPY/RETROGRADE PYELOGRAM/ LASER ABLATION/;  Surgeon: Devere Lonni Righter, MD;  Location[facility];  Service: Urology;  Laterality: Right;  45 MINUTES NEEDED   CYSTOSCOPY/URETEROSCOPY/HOLMIUM LASER/STENT PLACEMENT Right 03/07/2024   Procedure: CYSTOSCOPY/URETEROSCOPY/HOLMIUM LASER TUMOR ABALTION/STENT PLACEMENT/TURBT;  Surgeon: Devere Lonni Righter, MD;  [facility];  Service: Urology;  Laterality: Right;  CYSTOSCOPY WITH RIGHT RETROGRADE PYELOGRAM, URETEROSCOPY, POSSIBLE BIOPSY WITH LASER FULGURATION, POSSIBLE STENT PLACEMENT   EYE SURGERY Bilateral 1987   tear ducts   IR IMAGING  GUIDED PORT INSERTION  06/04/2020   IR NEPHROSTOMY PLACEMENT RIGHT  03/03/2022   THYROID  LOBECTOMY Right 1979   TOTAL HIP ARTHROPLASTY Right 01/28/2016   Procedure: RIGHT TOTAL HIP ARTHROPLASTY ANTERIOR APPROACH;  Surgeon: Dempsey Moan, MD;  Location: [facility];  Service: Orthopedics;  Laterality: Right;   UMBILICAL HERNIA REPAIR  1980s   URETEROSCOPY Right 10/01/2022   Procedure: URETEROSCOPY WITH BIOPSY pylegram;  Surgeon: Devere Lonni Righter, MD;  Location: [facility];  Service: Urology;  Laterality: Right;  45 MINS   VIDEO BRONCHOSCOPY WITH ENDOBRONCHIAL NAVIGATION N/A 06/06/2020   Procedure: VIDEO BRONCHOSCOPY WITH ENDOBRONCHIAL NAVIGATION;  Surgeon: Shelah Lamar RAMAN, MD;  Location[facility];  Service: Thoracic;  Laterality: N/A;   VIDEO BRONCHOSCOPY WITH ENDOBRONCHIAL NAVIGATION N/A 10/10/2020   Procedure: VIDEO BRONCHOSCOPY WITH ENDOBRONCHIAL NAVIGATION;  Surgeon: Kerrin Elspeth BROCKS, MD;  Location[facility];  Service: Thoracic;  Laterality: N/A;   Allergies  Allergen Reactions   Iodine Shortness Of Breath and Other (See Comments)   Keflex  [Cephalexin ] Shortness Of Breath, Rash and Tinitus   Shellfish Allergy Hives   Shellfish-Derived Products Anaphylaxis and Hives   Hydrocodone  Hives and Itching    Itching throat   Rosuvastatin  Other (See Comments)    Muscle Pain in Thighs  Ciprofloxacin  Other (See Comments)   Latex Rash   Lisinopril Cough   Sulfa Antibiotics Nausea Only   Tramadol  Other (See Comments)    hallucinations   Review of Systems  Musculoskeletal:  Positive for joint pain.     Physical Exam: Palpable pedal pulses.  Palpable bony prominence of the dorsal aspect of the right first MPJ.  Restricted range of motion at the first MPJ with pain.  No crepitus is noted.  Pain on palpation of the first MPJ.  Radiographic Exam (right foot, 3 weightbearing views, 03/26/2024):  Normal osseous mineralization. No fractures noted.  Increase in first intermetatarsal  angle with a tibial sesamoid position of 4.  Absent joint space in the first MPJ.  Moderate dorsal spurring at the first MPJ on lateral view.  Inferior calcaneal spur noted.  Assessment/Plan of Care: 1. Arthritis of first metatarsophalangeal (MTP) joint of right foot   2. Bone spur of toe of right foot   3. Pain in joint of right foot     DG FOOT COMPLETE RIGHT  Discussed clinical and radiographic findings with the patient.  Discussed conservative options including Voltaren gel to the area, shoe modifications including stiffer soled shoes, cortisone injections, and orthotic modification.  She can bring her orthotics back and have our pedorthist apply a Morton's extension to the right orthotic to help restrict motion and decrease aggravation of pain to the first MPJ.  Briefly discussed surgical intervention.  This would include Keller arthroplasty versus arthrodesis of the first MPJ.  Discussed pros and cons of both procedures and would recommend a Keller arthroplasty if she opted for surgical intervention moving forward.  Follow-up as needed   Maria Marquez, DPM, FACFAS [facility]     [address] [place], [place] [address]                Office [phone]  Fax [phone]

## 2024-03-27 ENCOUNTER — Encounter: Payer: Self-pay | Admitting: Internal Medicine

## 2024-03-27 ENCOUNTER — Telehealth: Payer: Self-pay | Admitting: Internal Medicine

## 2024-03-27 MED ORDER — DAPAGLIFLOZIN PROPANEDIOL 5 MG PO TABS: 5.0000 mg | ORAL_TABLET | Freq: Every day | ORAL | 1 refills | Status: DC

## 2024-03-27 MED ORDER — GLUCOSE 4 G PO CHEW: 1.0000 | CHEWABLE_TABLET | ORAL | 2 refills | Status: AC | PRN

## 2024-03-27 NOTE — Telephone Encounter (Signed)
Answered via Mychart

## 2024-03-27 NOTE — Telephone Encounter (Signed)
 Copied from CRM #[id number]. Topic: General - Other >> Mar 27, 2024  3:12 PM Gennette ORN wrote: Reason for CRM: Patient is calling about the follow up appointment does she need to schedule or wait ? She never got a response back by my chart. Please follow up with patient.

## 2024-03-27 NOTE — Addendum Note (Signed)
 Addended by: KATHRYNE MILLMAN B on: 03/27/2024 02:34 PM   Modules accepted: Orders

## 2024-04-06 ENCOUNTER — Inpatient Hospital Stay (HOSPITAL_BASED_OUTPATIENT_CLINIC_OR_DEPARTMENT_OTHER): Payer: Medicare HMO | Attending: Oncology | Admitting: Hematology and Oncology

## 2024-04-06 ENCOUNTER — Inpatient Hospital Stay: Payer: Medicare HMO | Attending: Oncology

## 2024-04-06 VITALS — BP 128/80 | HR 79 | Temp 97.7°F | Resp 17 | Wt 271.4 lb

## 2024-04-06 DIAGNOSIS — R7989 Other specified abnormal findings of blood chemistry: Secondary | ICD-10-CM | POA: Diagnosis not present

## 2024-04-06 DIAGNOSIS — C659 Malignant neoplasm of unspecified renal pelvis: Secondary | ICD-10-CM | POA: Diagnosis not present

## 2024-04-06 DIAGNOSIS — C3490 Malignant neoplasm of unspecified part of unspecified bronchus or lung: Secondary | ICD-10-CM | POA: Diagnosis not present

## 2024-04-06 DIAGNOSIS — Z85118 Personal history of other malignant neoplasm of bronchus and lung: Secondary | ICD-10-CM | POA: Insufficient documentation

## 2024-04-06 DIAGNOSIS — Z8553 Personal history of malignant neoplasm of renal pelvis: Secondary | ICD-10-CM | POA: Insufficient documentation

## 2024-04-06 DIAGNOSIS — Z95828 Presence of other vascular implants and grafts: Secondary | ICD-10-CM

## 2024-04-06 DIAGNOSIS — Z08 Encounter for follow-up examination after completed treatment for malignant neoplasm: Secondary | ICD-10-CM | POA: Insufficient documentation

## 2024-04-06 DIAGNOSIS — N179 Acute kidney failure, unspecified: Secondary | ICD-10-CM | POA: Insufficient documentation

## 2024-04-06 LAB — CMP (CANCER CENTER ONLY)
ALT: 12 U/L (ref 0–44)
AST: 10 U/L — ABNORMAL LOW (ref 15–41)
Albumin: 3.9 g/dL (ref 3.5–5.0)
Alkaline Phosphatase: 74 U/L (ref 38–126)
Anion gap: 9 (ref 5–15)
BUN: 37 mg/dL — ABNORMAL HIGH (ref 8–23)
CO2: 26 mmol/L (ref 22–32)
Calcium: 9.1 mg/dL (ref 8.9–10.3)
Chloride: 104 mmol/L (ref 98–111)
Creatinine: 1.84 mg/dL — ABNORMAL HIGH (ref 0.44–1.00)
GFR, Estimated: 28 mL/min — ABNORMAL LOW (ref >60)
Glucose, Bld: 114 mg/dL — ABNORMAL HIGH (ref 70–99)
Potassium: 3.6 mmol/L (ref 3.5–5.1)
Sodium: 139 mmol/L (ref 135–145)
Total Bilirubin: 0.8 mg/dL (ref 0.0–1.2)
Total Protein: 6.6 g/dL (ref 6.5–8.1)

## 2024-04-06 LAB — CBC WITH DIFFERENTIAL (CANCER CENTER ONLY)
Abs Immature Granulocytes: 0.02 K/uL (ref 0.00–0.07)
Basophils Absolute: 0 K/uL (ref 0.0–0.1)
Basophils Relative: 0 %
Eosinophils Absolute: 0.2 K/uL (ref 0.0–0.5)
Eosinophils Relative: 3 %
HCT: 38.8 % (ref 36.0–46.0)
Hemoglobin: 13 g/dL (ref 12.0–15.0)
Immature Granulocytes: 0 %
Lymphocytes Relative: 20 %
Lymphs Abs: 1.5 K/uL (ref 0.7–4.0)
MCH: 29.4 pg (ref 26.0–34.0)
MCHC: 33.5 g/dL (ref 30.0–36.0)
MCV: 87.8 fL (ref 80.0–100.0)
Monocytes Absolute: 0.6 K/uL (ref 0.1–1.0)
Monocytes Relative: 8 %
Neutro Abs: 5 K/uL (ref 1.7–7.7)
Neutrophils Relative %: 69 %
Platelet Count: 239 K/uL (ref 150–400)
RBC: 4.42 MIL/uL (ref 3.87–5.11)
RDW: 14.2 % (ref 11.5–15.5)
WBC Count: 7.3 K/uL (ref 4.0–10.5)
nRBC: 0 % (ref 0.0–0.2)

## 2024-04-06 MED ORDER — SODIUM CHLORIDE 0.9% FLUSH
10.0000 mL | Freq: Once | INTRAVENOUS | Status: AC
  Administered 2024-04-06: 10 mL

## 2024-04-06 NOTE — Progress Notes (Signed)
 [facility] [phone]   [phone]  PROGRESS NOTE  Patient Care Team: Theophilus Andrews, Tully GRADE, MD as PCP - General (Internal Medicine) Aneita Gwendlyn DASEN, MD (Inactive) as Consulting Physician (Gastroenterology) Wilder Glean Cassis as Consulting Physician (Surgery) Melodi Lerner, MD as Consulting Physician (Orthopedic Surgery) Frutoso Luz, MD as Referring Physician (Allergy) Sharron Wyatt SQUIBB, PsyD as Counselor (Psychology) Gaston Hamilton, MD as Consulting Physician (Urology) Lionell Jon DEL, [facility] (Pharmacist) Marcey Elspeth PARAS, MD as Consulting Physician (Ophthalmology)  Hematological/Oncological History #  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  She completed 3 cycles and cycle 4 was given without cisplatin  completed in December 2021.  10/10/2020: adenocarcinoma of the lung diagnosed  11/13/2020-11/26/2020: SBRT to right lung cancer, received total of 50 Gray and 10 fractions.  04/20/2022: last visit with Dr. Amadeo  10/07/2022: transition care to Dr. Federico   Interval History:  Maria Marquez 78 y.o. 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 The patient's last visit was on 10/19/2023. In the interim since the last visit she has had no major changes in her health.  On exam today Maria Marquez reports she has been well overall in the interim since her last visit.  She continues with cystoscopies and reports that she is being followed on a regular basis.  She notes that she was also started on Ozempic by her nephrologist.  She reports that her weight has dropped to 271 pounds, from 279 pounds in June.  She reports her energy levels  have been good.  She notes her energy is about a 7 or 8 out of 10.  She reports that she is breathing good but does have some occasional tightness in the chest and she does use an inhaler to help with this.  She reports that she also recently had 4 teeth extracted and was told that the bone loss in her jaw was due to chemotherapy.  She notes that she has a port in place and would like to have it removed but was agreeable to holding until her next CT scan.  Unfortunately also in the interim since her last visit her daughter died of ovarian cancer.  She otherwise denies any fevers, chills, sweats, nausea, vomiting or diarrhea.  MEDICAL HISTORY:  Past Medical History:  Diagnosis Date   Anemia associated with chemotherapy    followed by dr federico   Carcinoma of renal pelvis, right [facility]) 03/2020   urologist-- dr winter/  oncologist-- dr federico;   dx 08/ 2021 ;  chemo 10/ 2021  to 12/ 2021 and complete laser tumor ablation;  recurrent 05/ 2023   completed 6 mitomyocin gel infusions via nephrostomy tube 08/ 2023   Chronic kidney disease, stage 3a (HCC) 12/31/2019   nephrologist--- dr s. macel;  due to chemotherapy   Chronic rhinitis    w/ upper airway wheezing due to nasal drainage per pulmonogy note (dr meade),  using stiolto inhaler   Complication of anesthesia    affects memory for a few days   Diabetes mellitus type 2, diet-controlled (HCC)    followed by pcp;    (04-20-2023 pt has started take farxiga  daily;    per pt check blood sugar multiple times daily w/ Libre,  fasting average  120--125   Dyspnea on exertion    04-20-2023 pt stated sob w/ long distancewalk and stairs but recovers quickly when stop/ rest,  ok with household chores and short walks   Family history of breast cancer 05/04/2021   Family history of ovarian cancer 05/04/2021   Generalized weakness    GERD (gastroesophageal reflux disease)    History of basal cell carcinoma (BCC) excision    per pt in 1970s on nose   History  of chemotherapy    06-13-2020  to 12/ 2021  for right renal pelvis carcinoma   History of colon polyps    History of radiation therapy    11-13-2020 to 11-26-2020---   Right Lung- SBRT- Dr. Lynwood Nasuti   HLD (hyperlipidemia)    Hypertension    followed by pcp   Hypothyroidism, postsurgical 1979   followed by pcp   Leukocytosis    Nocturia    OA (osteoarthritis)    Pneumonia    PONV (postoperative nausea and vomiting)    Since chemo, ponv,   Port-A-Cath in place    Primary adenocarcinoma of upper lobe of right lung [facility]) 09/2020   oncologist--- dr Esteen Delpriore/  pulmonology-- dr n. meade;  dx 02/ 2022;  s/p  SBRT 11-13-2020 to 11-26-2020   Urgency of urination    wears pads   Varicose veins of both legs with edema    Wears partial dentures    lower    SURGICAL HISTORY: Past Surgical History:  Procedure Laterality Date   COLONOSCOPY  last one 2017   CYSTOSCOPY W/ URETERAL STENT PLACEMENT Right 01/27/2022   Procedure: CYSTOSCOPY WITH RETROGRADE PYELOGRAM/ URETEROSCOPY/POSSIBLE BIOPSY/ POSSIBLE  URETERAL STENT PLACEMENT;  Surgeon: Devere Lonni Righter, MD;  Location[facility];  Service: Urology;  Laterality: Right;   CYSTOSCOPY WITH BIOPSY Right 09/16/2021   Procedure: CYSTOSCOPY / URETEROSCOPY WITH BIOPSY/bugbee fulgeration/right retrograde;  Surgeon: Devere Lonni Righter, MD;  Location: [facility];  Service: Urology;  Laterality: Right;   CYSTOSCOPY WITH BIOPSY N/A 11/02/2023   Procedure: CYSTOSCOPY WITH BIOPSY;  Surgeon: Devere Lonni Righter, MD;  Location: [facility];  Service: Urology;  Laterality: N/A;   CYSTOSCOPY WITH BIOPSY Right 03/07/2024   Procedure: CYSTOSCOPY, WITH BIOPSY;  Surgeon: Devere Lonni Righter, MD;  Location: [facility];  Service: Urology;  Laterality: Right;   CYSTOSCOPY WITH RETROGRADE PYELOGRAM, URETEROSCOPY AND STENT PLACEMENT Right 04/27/2023   Procedure: CYSTOSCOPY, WITH RIGHT RETROGRADE PYELOGRAM, RIGHT URETEROSCOPY,  RIGHT URETERAL STENT PLACEMENT, FULGURATION OF RIGHT RENAL PELVIS TUMORS;  Surgeon: Devere Lonni Righter, MD;  Location: [facility];  Service: Urology;  Laterality: Right;   CYSTOSCOPY WITH STENT PLACEMENT Right 10/01/2022   Procedure: CYSTOSCOPY WITH STENT PLACEMENT;  Surgeon: Devere Lonni Righter, MD;  Location: [facility];  Service: Urology;  Laterality: Right;   CYSTOSCOPY WITH URETEROSCOPY Right 04/18/2020   Procedure: CYSTOSCOPY WITH URETEROSCOPY, BIOPSY;  Surgeon: Devere Lonni Righter, MD;  Location: [facility];  Service: Urology;  Laterality: Right;  ONLY NEEDS 45 MIN   CYSTOSCOPY WITH URETEROSCOPY Right 06/02/2022   Procedure: CYSTOSCOPY WITH URETEROSCOPY;  Surgeon: Devere Lonni Righter, MD;  Location: [facility];  Service: Urology;  Laterality: Right;  ONLY NEEDS 60 MINS   CYSTOSCOPY WITH URETEROSCOPY AND STENT PLACEMENT N/A 12/31/2020   Procedure: CYSTOSCOPY WITH RIGHT URETEROSCOPY/ BILATERAL RETROGRADE PYELOGRAM/RIGHT URETERAL BIOPSY/ LASER ABLATION/RIGHT STENT PLACEMENT;  Surgeon: Devere Lonni Righter, MD;  Location: [facility];  Service: Urology;  Laterality: N/A;  CYSTOSCOPY/RETROGRADE/URETEROSCOPY Right 04/22/2021   Procedure: CYSTOSCOPY/RETROGRADE/URETEROSCOPY/ STENT PLACEMENT;  Surgeon: Devere Lonni Righter, MD;  Location: [facility];  Service: Urology;  Laterality: Right;   CYSTOSCOPY/URETEROSCOPY/HOLMIUM LASER/STENT PLACEMENT Right 11/02/2023   Procedure: CYSTOSCOPY/RIGHT URETEROSCOPY/RETROGRADE PYELOGRAM/ LASER ABLATION/;  Surgeon: Devere Lonni Righter, MD;  Location: [facility];  Service: Urology;  Laterality: Right;  45 MINUTES NEEDED   CYSTOSCOPY/URETEROSCOPY/HOLMIUM LASER/STENT PLACEMENT Right 03/07/2024   Procedure: CYSTOSCOPY/URETEROSCOPY/HOLMIUM LASER TUMOR ABALTION/STENT PLACEMENT/TURBT;  Surgeon: Devere Lonni Righter, MD;  Location: [facility];  Service: Urology;   Laterality: Right;  CYSTOSCOPY WITH RIGHT RETROGRADE PYELOGRAM, URETEROSCOPY, POSSIBLE BIOPSY WITH LASER FULGURATION, POSSIBLE STENT PLACEMENT   EYE SURGERY Bilateral 1987   tear ducts   IR IMAGING GUIDED PORT INSERTION  06/04/2020   IR NEPHROSTOMY PLACEMENT RIGHT  03/03/2022   THYROID  LOBECTOMY Right 1979   TOTAL HIP ARTHROPLASTY Right 01/28/2016   Procedure: RIGHT TOTAL HIP ARTHROPLASTY ANTERIOR APPROACH;  Surgeon: Dempsey Moan, MD;  Location: [facility];  Service: Orthopedics;  Laterality: Right;   UMBILICAL HERNIA REPAIR  1980s   URETEROSCOPY Right 10/01/2022   Procedure: URETEROSCOPY WITH BIOPSY pylegram;  Surgeon: Devere Lonni Righter, MD;  Location: [facility];  Service: Urology;  Laterality: Right;  45 MINS   VIDEO BRONCHOSCOPY WITH ENDOBRONCHIAL NAVIGATION N/A 06/06/2020   Procedure: VIDEO BRONCHOSCOPY WITH ENDOBRONCHIAL NAVIGATION;  Surgeon: Shelah Lamar RAMAN, MD;  Location: [facility];  Service: Thoracic;  Laterality: N/A;   VIDEO BRONCHOSCOPY WITH ENDOBRONCHIAL NAVIGATION N/A 10/10/2020   Procedure: VIDEO BRONCHOSCOPY WITH ENDOBRONCHIAL NAVIGATION;  Surgeon: Kerrin Elspeth BROCKS, MD;  Location: [facility];  Service: Thoracic;  Laterality: N/A;    SOCIAL HISTORY: Social History   Socioeconomic History   Marital status: Widowed    Spouse name: Not on file   Number of children: Not on file   Years of education: Not on file   Highest education level: Associate degree: occupational, Scientist, product/process development, or vocational program  Occupational History   Occupation: Retired  Tobacco Use   Smoking status: Former    Current packs/day: 0.00    Average packs/day: 1 pack/day for 45.0 years (45.0 ttl pk-yrs)    Types: Cigarettes    Start date: 04/19/1975    Quit date: 04/18/2020    Years since quitting: 3.9   Smokeless tobacco: Never  Vaping Use   Vaping status: Never Used  Substance and Sexual Activity   Alcohol use: Not Currently    Comment: rare   Drug use: No   Sexual activity: Not  Currently  Other Topics Concern   Not on file  Social History Narrative   Not on file   Social Drivers of Health   Financial Resource Strain: Low Risk  (02/13/2024)   Overall Financial Resource Strain (CARDIA)    Difficulty of Paying Living Expenses: Not hard at all  Food Insecurity: No Food Insecurity (02/13/2024)   Hunger Vital Sign    Worried About Running Out of Food in the Last Year: Never true    Ran Out of Food in the Last Year: Never true  Transportation Needs: No Transportation Needs (02/13/2024)   PRAPARE - Administrator, Civil Service (Medical): No    Lack of Transportation (Non-Medical): No  Physical Activity: Inactive (02/13/2024)   Exercise Vital Sign    Days of Exercise per Week: 0 days    Minutes of Exercise per Session: Not on file  Stress: No Stress Concern Present (02/13/2024)   [employer] of Occupational Health - Occupational Stress Questionnaire  Feeling of Stress: Not at all  Social Connections: Socially Isolated (02/13/2024)   Social Connection and Isolation Panel    Frequency of Communication with Friends and Family: Twice a week    Frequency of Social Gatherings with Friends and Family: Once a week    Attends Religious Services: Never    Database administrator or Organizations: No    Attends Engineer, structural: Not on file    Marital Status: Widowed  Intimate Partner Violence: Not At Risk (01/25/2024)   Humiliation, Afraid, Rape, and Kick questionnaire    Fear of Current or Ex-Partner: No    Emotionally Abused: No    Physically Abused: No    Sexually Abused: No    FAMILY HISTORY: Family History  Problem Relation Age of Onset   Arthritis Mother    Breast cancer Mother 72   Schizophrenia Mother    Diabetes Father    Heart disease Father    Kidney disease Father    CAD Father    Stroke Sister    Lung cancer Brother        dx > 50; work-related exposures   Heart disease Brother    Heart disease Brother    Vision  loss Brother        Glaucoma   Alcohol abuse Brother    Ovarian cancer Daughter 36   Lung cancer Maternal Aunt        dx > 50   Colon cancer Paternal Aunt        dx > 50   Head & neck cancer Paternal Uncle        dx 42s   Leukemia Cousin        d. 38s   Breast cancer Niece 60    ALLERGIES:  is allergic to iodine, keflex  [cephalexin ], shellfish allergy, shellfish-derived products, hydrocodone , rosuvastatin , ciprofloxacin , latex, lisinopril, sulfa antibiotics, and tramadol .  MEDICATIONS:  Current Outpatient Medications  Medication Sig Dispense Refill   acetaminophen  (TYLENOL ) 500 MG tablet Take 1,500 mg by mouth every 6 (six) hours as needed for moderate pain (pain score 4-6).     albuterol  (VENTOLIN  HFA) 108 (90 Base) MCG/ACT inhaler INHALE ONE OR TWO PUFFS BY MOUTH INTO THE LUNGS EVERY SIX HOURS AS NEEDED FOR WHEEZING OR SHORTNESS OF BREATH 8.5 g 0   atorvastatin  (LIPITOR) 20 MG tablet TAKE 1 TABLET EVERY DAY (NEED APPOINTMENT) 90 tablet 3   Continuous Blood Gluc Receiver (FREESTYLE LIBRE 3 READER) DEVI 1 each by Does not apply route daily. 1 each 3   Continuous Glucose Sensor (FREESTYLE LIBRE 3 SENSOR) MISC PLACE ONE SENSOR ONTO THE SKIN ONCE EVERY 14 DAYS; *CHECK GLUCOSE CONTINUOUSLY* REMOVE OLD SENSOR 2 each 3   dapagliflozin  propanediol (FARXIGA ) 10 MG TABS tablet Take 10 mg by mouth daily.     dapagliflozin  propanediol (FARXIGA ) 5 MG TABS tablet Take 1 tablet (5 mg total) by mouth daily before breakfast. 90 tablet 1   EPINEPHRINE  0.3 mg/0.3 mL IJ SOAJ injection INJECT 0.3MG  INTO THE MUSCLE AS NEEDED FOR ANAPHYLAXIS 2 each 0   estradiol -norethindrone  (ACTIVELLA) 1-0.5 MG tablet Take 1 tablet by mouth daily. (Patient taking differently: Take 1 tablet by mouth every other day.) 28 tablet 3   ezetimibe  (ZETIA ) 10 MG tablet TAKE 1 TABLET EVERY DAY (NEED MD APPOINTMENT FOR REFILLS) 90 tablet 3   fluticasone  (FLONASE ) 50 MCG/ACT nasal spray Place 1 spray into both nostrils daily. (Patient  taking differently: Place 1 spray into both nostrils daily as  needed for allergies ([place]).) 48 each 11   furosemide  (LASIX ) 40 MG tablet Take 40 mg by mouth 2 (two) times daily as needed for fluid or edema.     glucose 4 GM chewable tablet Chew 1 tablet (4 g total) by mouth as needed for low blood sugar. 30 tablet 2   levothyroxine  (SYNTHROID ) 150 MCG tablet TAKE 1 TABLET EVERY DAY (NEED MD APPOINTMENT) 90 tablet 1   lidocaine -prilocaine  (EMLA ) cream Apply 1 Application topically as needed. 30 g 0   losartan -hydrochlorothiazide  (HYZAAR) 100-25 MG tablet Take 1 tablet by mouth daily.     ondansetron  (ZOFRAN -ODT) 8 MG disintegrating tablet Take 8 mg by mouth every 8 (eight) hours as needed for nausea or vomiting.     oxybutynin  (DITROPAN ) 5 MG tablet Take 1 tablet (5 mg total) by mouth every 8 (eight) hours as needed for bladder spasms. 30 tablet 1   pantoprazole  (PROTONIX ) 40 MG tablet TAKE 1 TABLET BY MOUTH EVERY DAY (Patient taking differently: Take 40 mg by mouth every other day.) 90 tablet 1   phenazopyridine  (PYRIDIUM ) 200 MG tablet Take 1 tablet (200 mg total) by mouth 3 (three) times daily as needed (for pain with urination). 30 tablet 0   POTASSIUM CHLORIDE  PO Take 20 mEq by mouth daily as needed (Take with fursomide).     Semaglutide,0.25 or 0.5MG /DOS, 2 MG/1.5ML SOPN Inject 0.5 mLs into the skin once a week.     Tiotropium Bromide-Olodaterol (STIOLTO RESPIMAT ) 2.5-2.5 MCG/ACT AERS Inhale 2 puffs into the lungs daily after lunch. 4 g 11   XIIDRA 5 % SOLN Place 1 drop into both eyes 2 (two) times daily.     No current facility-administered medications for this visit.    REVIEW OF SYSTEMS:   Constitutional: ( - ) fevers, ( - )  chills , ( - ) night sweats Eyes: ( - ) blurriness of vision, ( - ) double vision, ( - ) watery eyes Ears, nose, mouth, throat, and face: ( - ) mucositis, ( - ) sore throat Respiratory: ( - ) cough, ( - ) dyspnea, ( - ) wheezes Cardiovascular: ( - ) palpitation,  ( - ) chest discomfort, ( - ) lower extremity swelling Gastrointestinal:  ( - ) nausea, ( - ) heartburn, ( - ) change in bowel habits Skin: ( - ) abnormal skin rashes Lymphatics: ( - ) new lymphadenopathy, ( - ) easy bruising Neurological: ( - ) numbness, ( - ) tingling, ( - ) new weaknesses Behavioral/Psych: ( - ) mood change, ( - ) new changes  All other systems were reviewed with the patient and are negative.  PHYSICAL EXAMINATION:  Vitals:   04/06/24 1413  BP: 128/80  Pulse: 79  Resp: 17  Temp: 97.7 F (36.5 C)  SpO2: 97%   Filed Weights   04/06/24 1413  Weight: 271 lb 6.4 oz (123.1 kg)    GENERAL: Well-appearing elderly Caucasian female, alert, no distress and comfortable SKIN: skin color, texture, turgor are normal, no rashes or significant lesions EYES: conjunctiva are pink and non-injected, sclera clear LUNGS: clear to auscultation and percussion with normal breathing effort HEART: regular rate & rhythm and no murmurs and no lower extremity edema Musculoskeletal: no cyanosis of digits and no clubbing  PSYCH: alert & oriented x 3, fluent speech NEURO: no focal motor/sensory deficits  LABORATORY DATA:  I have reviewed the data as listed    Latest Ref Rng & Units 04/06/2024    1:51 PM 02/29/2024  2:50 PM 12/19/2023   12:00 AM  CBC  WBC 4.0 - 10.5 K/uL 7.3  9.6    Hemoglobin 12.0 - 15.0 g/dL 86.9  86.4  87.1      Hematocrit 36.0 - 46.0 % 38.8  42.3  41      Platelets 150 - 400 K/uL 239  268       This result is from an external source.       Latest Ref Rng & Units 04/06/2024    1:51 PM 02/29/2024    2:50 PM 12/19/2023   12:00 AM  CMP  Glucose 70 - 99 mg/dL 885  865    BUN 8 - 23 mg/dL 37  37  31      Creatinine 0.44 - 1.00 mg/dL 8.15  8.26  1.6      Sodium 135 - 145 mmol/L 139  138  121      Potassium 3.5 - 5.1 mmol/L 3.6  3.5  3.9      Chloride 98 - 111 mmol/L 104  100  99      CO2 22 - 32 mmol/L 26  25  23       Calcium  8.9 - 10.3 mg/dL 9.1  9.0  9.1       Total Protein 6.5 - 8.1 g/dL 6.6  7.3    Total Bilirubin 0.0 - 1.2 mg/dL 0.8  1.4    Alkaline Phos 38 - 126 U/L 74  75    AST 15 - 41 U/L 10  16    ALT 0 - 44 U/L 12  15       This result is from an external source.    RADIOGRAPHIC STUDIES: DG Foot Complete Right Result Date: 03/26/2024 Please see detailed radiograph report in office note.   ASSESSMENT & PLAN Maria Marquez 78 y.o. 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 Devere  --Salvage therapy options including Pembrolizumab will be deferred for if she developed relapsed disease  --continue to be on active surveillance and repeat cystoscopy  --Labs today show white blood cell count 7.3 Hgb 13.0, MCV 87.8, Plt 239  --RTC in 6 months or sooner if signs/symptoms of recurrent disease.   #Elevated Cr # AKI -- Creatinine elevated to 1.84 today -- next week will see her nephrologist Dr. Macel  # T1aN0 adenocarcinoma of the lung  -- recommend CT chest q 6 months x 5 years followed by annually thereafter.  -- Continue every 6 month follow up.  Next scan due in Oct 2025   Orders Placed This Encounter  Procedures   CT CHEST ABDOMEN PELVIS WO CONTRAST    Standing Status:   Future    Expected Date:   06/05/2024    Expiration Date:   04/08/2025    Preferred imaging location?:   [facility]    If indicated for the ordered procedure, I authorize the administration of oral contrast media per Radiology protocol:   Yes    Does the patient have a contrast media/X-ray dye allergy?:   No    All questions were answered. The patient knows to call the clinic with any problems, questions or concerns.  A total of more than  30 minutes were spent on this encounter with face-to-face time and non-face-to-face time, including preparing to see the patient, ordering tests and/or medications, counseling the patient and coordination of care as outlined above.   Norleen IVAR Kidney, MD Department of Hematology/Oncology [facility] Phone: [phone] Pager: [phone] Email: [email or web address]  04/08/2024 6:45 PM

## 2024-04-08 ENCOUNTER — Encounter: Payer: Self-pay | Admitting: Hematology and Oncology

## 2024-04-10 ENCOUNTER — Ambulatory Visit

## 2024-04-10 NOTE — Progress Notes (Unsigned)
 Maria Marquez

## 2024-04-13 DIAGNOSIS — N1832 Chronic kidney disease, stage 3b: Secondary | ICD-10-CM | POA: Diagnosis not present

## 2024-04-26 DIAGNOSIS — N179 Acute kidney failure, unspecified: Secondary | ICD-10-CM | POA: Diagnosis not present

## 2024-04-26 DIAGNOSIS — Z961 Presence of intraocular lens: Secondary | ICD-10-CM | POA: Diagnosis not present

## 2024-04-26 DIAGNOSIS — N1832 Chronic kidney disease, stage 3b: Secondary | ICD-10-CM | POA: Diagnosis not present

## 2024-04-26 DIAGNOSIS — H35033 Hypertensive retinopathy, bilateral: Secondary | ICD-10-CM | POA: Diagnosis not present

## 2024-04-26 DIAGNOSIS — H0102B Squamous blepharitis left eye, upper and lower eyelids: Secondary | ICD-10-CM | POA: Diagnosis not present

## 2024-04-26 DIAGNOSIS — I129 Hypertensive chronic kidney disease with stage 1 through stage 4 chronic kidney disease, or unspecified chronic kidney disease: Secondary | ICD-10-CM | POA: Diagnosis not present

## 2024-04-26 DIAGNOSIS — H16223 Keratoconjunctivitis sicca, not specified as Sjogren's, bilateral: Secondary | ICD-10-CM | POA: Diagnosis not present

## 2024-04-26 DIAGNOSIS — E1122 Type 2 diabetes mellitus with diabetic chronic kidney disease: Secondary | ICD-10-CM | POA: Diagnosis not present

## 2024-04-26 DIAGNOSIS — R609 Edema, unspecified: Secondary | ICD-10-CM | POA: Diagnosis not present

## 2024-04-26 LAB — BASIC METABOLIC PANEL WITH GFR
BUN: 37 — AB (ref 4–21)
CO2: 22 (ref 13–22)
Chloride: 99 (ref 99–108)
Creatinine: 1.8 — AB (ref 0.5–1.1)
Glucose: 119
Potassium: 4.2 meq/L (ref 3.5–5.1)
Sodium: 138 (ref 137–147)

## 2024-04-26 LAB — LAB REPORT - SCANNED: EGFR: 28

## 2024-04-26 LAB — COMPREHENSIVE METABOLIC PANEL WITH GFR
Calcium: 9.8 (ref 8.7–10.7)
eGFR: 28

## 2024-05-01 ENCOUNTER — Encounter: Payer: Self-pay | Admitting: Internal Medicine

## 2024-05-08 ENCOUNTER — Encounter: Payer: Self-pay | Admitting: Internal Medicine

## 2024-05-08 ENCOUNTER — Ambulatory Visit: Admitting: Internal Medicine

## 2024-05-08 VITALS — BP 120/78 | HR 65 | Temp 97.6°F | Wt 275.3 lb

## 2024-05-08 DIAGNOSIS — Z23 Encounter for immunization: Secondary | ICD-10-CM | POA: Diagnosis not present

## 2024-05-08 DIAGNOSIS — E1142 Type 2 diabetes mellitus with diabetic polyneuropathy: Secondary | ICD-10-CM

## 2024-05-08 LAB — POCT GLYCOSYLATED HEMOGLOBIN (HGB A1C): Hemoglobin A1C: 6.2 % — AB (ref 4.0–5.6)

## 2024-05-08 MED ORDER — LANCETS MISC. MISC: 1.0000 | Freq: Three times a day (TID) | 0 refills | Status: AC

## 2024-05-08 MED ORDER — BLOOD GLUCOSE TEST VI STRP: 1.0000 | ORAL_STRIP | Freq: Three times a day (TID) | 0 refills | Status: AC

## 2024-05-08 MED ORDER — LANCET DEVICE MISC: 1.0000 | Freq: Three times a day (TID) | 0 refills | Status: AC

## 2024-05-08 MED ORDER — BLOOD GLUCOSE MONITORING SUPPL DEVI: 1.0000 | Freq: Three times a day (TID) | 0 refills | Status: AC

## 2024-05-08 NOTE — Progress Notes (Signed)
 Established Patient Office Visit     CC/Reason for Visit: Follow-up low blood sugar, requesting vaccines  HPI: Maria Marquez is a 78 y.o. female who is coming in today for the above mentioned reasons.  Has a history of diabetes.  About a month ago she messages stating that she was having frequent lows.  We decreased her Farxiga  from 10 to 5 mg and Ozempic from 0.5 to 0.25 mg.  She is no longer having hypoglycemic episodes.  She wants to increase her Ozempic again as her weight loss has stopped.   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 macel;  due to chemotherapy   Chronic rhinitis    w/ upper airway wheezing due to nasal drainage per pulmonogy note (dr meade),  using stiolto inhaler   Complication of anesthesia    affects memory for a few days   Diabetes mellitus type 2, diet-controlled (HCC)    followed by pcp;    (04-20-2023 pt has started take farxiga  daily;    per pt check blood sugar multiple times daily w/ Libre,  fasting average 120--125   Dyspnea on exertion    04-20-2023 pt stated sob w/ long distancewalk and stairs but recovers quickly when stop/ rest,  ok with household chores and short walks   Family history of breast cancer 05/04/2021   Family history of ovarian cancer 05/04/2021   Generalized weakness    GERD (gastroesophageal reflux disease)    History of basal cell carcinoma (BCC) excision    per pt in 1970s on nose   History of chemotherapy    06-13-2020  to 12/ 2021  for right renal pelvis carcinoma   History of colon  polyps    History of radiation therapy    11-13-2020 to 11-26-2020---   Right Lung- SBRT- Dr. Lynwood Nasuti   HLD (hyperlipidemia)    Hypertension    followed by pcp   Hypothyroidism, postsurgical 1979   followed by pcp   Leukocytosis    Nocturia    OA (osteoarthritis)    Pneumonia    PONV (postoperative nausea and vomiting)    Since chemo, ponv,   Port-A-Cath in place    Primary adenocarcinoma of upper lobe of right lung [facility]) 09/2020   oncologist--- dr dorsey/  pulmonology-- dr n. meade;  dx 02/ 2022;  s/p  SBRT 11-13-2020 to 11-26-2020   Urgency of urination    wears pads   Varicose veins of both legs with edema    Wears partial dentures    lower    Past Surgical History:  Procedure Laterality Date   COLONOSCOPY  last one 2017   CYSTOSCOPY W/ URETERAL STENT PLACEMENT Right 01/27/2022   Procedure: CYSTOSCOPY WITH RETROGRADE PYELOGRAM/ URETEROSCOPY/POSSIBLE BIOPSY/ POSSIBLE  URETERAL STENT PLACEMENT;  Surgeon: Devere Lonni Righter, MD;  Location: [facility];  Service: Urology;  Laterality: Right;   CYSTOSCOPY WITH BIOPSY Right 09/16/2021   Procedure: CYSTOSCOPY / URETEROSCOPY WITH BIOPSY/bugbee fulgeration/right retrograde;  Surgeon: Devere Lonni Righter,  MD;  Location: [facility];  Service: Urology;  Laterality: Right;   CYSTOSCOPY WITH BIOPSY N/A 11/02/2023   Procedure: CYSTOSCOPY WITH BIOPSY;  Surgeon: Devere Lonni Righter, MD;  Location: [facility];  Service: Urology;  Laterality: N/A;   CYSTOSCOPY WITH BIOPSY Right 03/07/2024   Procedure: CYSTOSCOPY, WITH BIOPSY;  Surgeon: Devere Lonni Righter, MD;  Location: [facility];  Service: Urology;  Laterality: Right;   CYSTOSCOPY WITH RETROGRADE PYELOGRAM, URETEROSCOPY AND STENT PLACEMENT Right 04/27/2023   Procedure: CYSTOSCOPY, WITH RIGHT RETROGRADE PYELOGRAM, RIGHT URETEROSCOPY, RIGHT URETERAL STENT PLACEMENT, FULGURATION OF RIGHT RENAL PELVIS TUMORS;  Surgeon: Devere Lonni Righter, MD;   Location: [facility];  Service: Urology;  Laterality: Right;   CYSTOSCOPY WITH STENT PLACEMENT Right 10/01/2022   Procedure: CYSTOSCOPY WITH STENT PLACEMENT;  Surgeon: Devere Lonni Righter, MD;  Location: [facility];  Service: Urology;  Laterality: Right;   CYSTOSCOPY WITH URETEROSCOPY Right 04/18/2020   Procedure: CYSTOSCOPY WITH URETEROSCOPY, BIOPSY;  Surgeon: Devere Lonni Righter, MD;  Location: [facility];  Service: Urology;  Laterality: Right;  ONLY NEEDS 45 MIN   CYSTOSCOPY WITH URETEROSCOPY Right 06/02/2022   Procedure: CYSTOSCOPY WITH URETEROSCOPY;  Surgeon: Devere Lonni Righter, MD;  Location: [facility];  Service: Urology;  Laterality: Right;  ONLY NEEDS 60 MINS   CYSTOSCOPY WITH URETEROSCOPY AND STENT PLACEMENT N/A 12/31/2020   Procedure: CYSTOSCOPY WITH RIGHT URETEROSCOPY/ BILATERAL RETROGRADE PYELOGRAM/RIGHT URETERAL BIOPSY/ LASER ABLATION/RIGHT STENT PLACEMENT;  Surgeon: Devere Lonni Righter, MD;  Location: [facility];  Service: Urology;  Laterality: N/A;   CYSTOSCOPY/RETROGRADE/URETEROSCOPY Right 04/22/2021   Procedure: CYSTOSCOPY/RETROGRADE/URETEROSCOPY/ STENT PLACEMENT;  Surgeon: Devere Lonni Righter, MD;  Location: [facility];  Service: Urology;  Laterality: Right;   CYSTOSCOPY/URETEROSCOPY/HOLMIUM LASER/STENT PLACEMENT Right 11/02/2023   Procedure: CYSTOSCOPY/RIGHT URETEROSCOPY/RETROGRADE PYELOGRAM/ LASER ABLATION/;  Surgeon: Devere Lonni Righter, MD;  Location: [facility];  Service: Urology;  Laterality: Right;  45 MINUTES NEEDED   CYSTOSCOPY/URETEROSCOPY/HOLMIUM LASER/STENT PLACEMENT Right 03/07/2024   Procedure: CYSTOSCOPY/URETEROSCOPY/HOLMIUM LASER TUMOR ABALTION/STENT PLACEMENT/TURBT;  Surgeon: Devere Lonni Righter, MD;  Location: [facility];  Service: Urology;  Laterality: Right;  CYSTOSCOPY WITH RIGHT RETROGRADE PYELOGRAM, URETEROSCOPY, POSSIBLE BIOPSY WITH LASER FULGURATION,  POSSIBLE STENT PLACEMENT   EYE SURGERY Bilateral 1987   tear ducts   IR IMAGING GUIDED PORT INSERTION  06/04/2020   IR NEPHROSTOMY PLACEMENT RIGHT  03/03/2022   THYROID  LOBECTOMY Right 1979   TOTAL HIP ARTHROPLASTY Right 01/28/2016   Procedure: RIGHT TOTAL HIP ARTHROPLASTY ANTERIOR APPROACH;  Surgeon: Dempsey Moan, MD;  Location: [facility];  Service: Orthopedics;  Laterality: Right;   UMBILICAL HERNIA REPAIR  1980s   URETEROSCOPY Right 10/01/2022   Procedure: URETEROSCOPY WITH BIOPSY pylegram;  Surgeon: Devere Lonni Righter, MD;  Location: [facility];  Service: Urology;  Laterality: Right;  45 MINS   VIDEO BRONCHOSCOPY WITH ENDOBRONCHIAL NAVIGATION N/A 06/06/2020   Procedure: VIDEO BRONCHOSCOPY WITH ENDOBRONCHIAL NAVIGATION;  Surgeon: Shelah Lamar RAMAN, MD;  Location: [facility];  Service: Thoracic;  Laterality: N/A;   VIDEO BRONCHOSCOPY WITH ENDOBRONCHIAL NAVIGATION N/A 10/10/2020   Procedure: VIDEO BRONCHOSCOPY WITH ENDOBRONCHIAL NAVIGATION;  Surgeon: Kerrin Elspeth BROCKS, MD;  Location: [facility];  Service: Thoracic;  Laterality: N/A;    Social History:  reports that she quit smoking about 4 years ago. Her smoking use included cigarettes. She started smoking about 49 years ago. She has a 45 pack-year smoking history. She has never used smokeless tobacco. She reports that she does not currently use alcohol.  She reports that she does not use drugs.  Allergies: Allergies  Allergen Reactions   Iodine Shortness Of Breath and Other (See Comments)   Keflex  [Cephalexin ] Shortness Of Breath, Rash and Tinitus   Shellfish Allergy Hives   Shellfish-Derived Products Anaphylaxis and Hives   Hydrocodone  Hives and Itching    Itching throat   Rosuvastatin  Other (See Comments)    Muscle Pain in Thighs   Ciprofloxacin  Other (See Comments)   Latex Rash   Lisinopril Cough   Sulfa Antibiotics Nausea Only   Tramadol  Other (See Comments)    hallucinations    Family History:  Family History   Problem Relation Age of Onset   Arthritis Mother    Breast cancer Mother 52   Schizophrenia Mother    Diabetes Father    Heart disease Father    Kidney disease Father    CAD Father    Stroke Sister    Lung cancer Brother        dx > 50; work-related exposures   Heart disease Brother    Heart disease Brother    Vision loss Brother        Glaucoma   Alcohol abuse Brother    Ovarian cancer Daughter 50   Lung cancer Maternal Aunt        dx > 50   Colon cancer Paternal Aunt        dx > 50   Head & neck cancer Paternal Uncle        dx 1s   Leukemia Cousin        d. 60s   Breast cancer Niece 13     Current Outpatient Medications:    acetaminophen  (TYLENOL ) 500 MG tablet, Take 1,500 mg by mouth every 6 (six) hours as needed for moderate pain (pain score 4-6)., Disp: , Rfl:    albuterol  (VENTOLIN  HFA) 108 (90 Base) MCG/ACT inhaler, INHALE ONE OR TWO PUFFS BY MOUTH INTO THE LUNGS EVERY SIX HOURS AS NEEDED FOR WHEEZING OR SHORTNESS OF BREATH, Disp: 8.5 g, Rfl: 0   atorvastatin  (LIPITOR) 20 MG tablet, TAKE 1 TABLET EVERY DAY (NEED APPOINTMENT), Disp: 90 tablet, Rfl: 3   Blood Glucose Monitoring Suppl DEVI, 1 each by Does not apply route in the morning, at noon, and at bedtime. May substitute to any manufacturer covered by patient's insurance., Disp: 1 each, Rfl: 0   Continuous Blood Gluc Receiver (FREESTYLE LIBRE 3 READER) DEVI, 1 each by Does not apply route daily., Disp: 1 each, Rfl: 3   Continuous Glucose Sensor (FREESTYLE LIBRE 3 SENSOR) MISC, PLACE ONE SENSOR ONTO THE SKIN ONCE EVERY 14 DAYS; *CHECK GLUCOSE CONTINUOUSLY* REMOVE OLD SENSOR, Disp: 2 each, Rfl: 3   dapagliflozin  propanediol (FARXIGA ) 5 MG TABS tablet, Take 1 tablet (5 mg total) by mouth daily before breakfast., Disp: 90 tablet, Rfl: 1   EPINEPHRINE  0.3 mg/0.3 mL IJ SOAJ injection, INJECT 0.3MG  INTO THE MUSCLE AS NEEDED FOR ANAPHYLAXIS, Disp: 2 each, Rfl: 0   estradiol -norethindrone  (ACTIVELLA) 1-0.5 MG tablet, Take 1  tablet by mouth daily. (Patient taking differently: Take 1 tablet by mouth every other day.), Disp: 28 tablet, Rfl: 3   ezetimibe  (ZETIA ) 10 MG tablet, TAKE 1 TABLET EVERY DAY (NEED MD APPOINTMENT FOR REFILLS), Disp: 90 tablet, Rfl: 3   fluticasone  (FLONASE ) 50 MCG/ACT nasal spray, Place 1 spray into both nostrils daily. (Patient taking differently: Place 1 spray into both nostrils daily as needed for allergies ([place]).), Disp: 48 each, Rfl: 11   furosemide  (  LASIX ) 40 MG tablet, Take 40 mg by mouth 2 (two) times daily as needed for fluid or edema., Disp: , Rfl:    glucose 4 GM chewable tablet, Chew 1 tablet (4 g total) by mouth as needed for low blood sugar., Disp: 30 tablet, Rfl: 2   Glucose Blood (BLOOD GLUCOSE TEST STRIPS) STRP, 1 each by In Vitro route in the morning, at noon, and at bedtime. May substitute to any manufacturer covered by patient's insurance., Disp: 100 strip, Rfl: 0   Lancet Device MISC, 1 each by Does not apply route in the morning, at noon, and at bedtime. May substitute to any manufacturer covered by patient's insurance., Disp: 1 each, Rfl: 0   Lancets Misc. MISC, 1 each by Does not apply route in the morning, at noon, and at bedtime. May substitute to any manufacturer covered by patient's insurance., Disp: 100 each, Rfl: 0   levothyroxine  (SYNTHROID ) 150 MCG tablet, TAKE 1 TABLET EVERY DAY (NEED MD APPOINTMENT), Disp: 90 tablet, Rfl: 1   lidocaine -prilocaine  (EMLA ) cream, Apply 1 Application topically as needed., Disp: 30 g, Rfl: 0   losartan -hydrochlorothiazide  (HYZAAR) 100-25 MG tablet, Take 1 tablet by mouth daily., Disp: , Rfl:    ondansetron  (ZOFRAN -ODT) 8 MG disintegrating tablet, Take 8 mg by mouth every 8 (eight) hours as needed for nausea or vomiting., Disp: , Rfl:    oxybutynin  (DITROPAN ) 5 MG tablet, Take 1 tablet (5 mg total) by mouth every 8 (eight) hours as needed for bladder spasms., Disp: 30 tablet, Rfl: 1   pantoprazole  (PROTONIX ) 40 MG tablet, TAKE 1 TABLET BY  MOUTH EVERY DAY (Patient taking differently: Take 40 mg by mouth every other day.), Disp: 90 tablet, Rfl: 1   POTASSIUM CHLORIDE  PO, Take 20 mEq by mouth daily as needed (Take with fursomide)., Disp: , Rfl:    Semaglutide,0.25 or 0.5MG /DOS, 2 MG/1.5ML SOPN, Inject 0.5 mLs into the skin once a week., Disp: , Rfl:    Tiotropium Bromide-Olodaterol (STIOLTO RESPIMAT ) 2.5-2.5 MCG/ACT AERS, Inhale 2 puffs into the lungs daily after lunch., Disp: 4 g, Rfl: 11   XIIDRA 5 % SOLN, Place 1 drop into both eyes 2 (two) times daily., Disp: , Rfl:    dapagliflozin  propanediol (FARXIGA ) 10 MG TABS tablet, Take 10 mg by mouth daily., Disp: , Rfl:   Review of Systems:  Negative unless indicated in HPI.   Physical Exam: Vitals:   05/08/24 1328  BP: 120/78  Pulse: 65  Temp: 97.6 F (36.4 C)  TempSrc: Oral  SpO2: 97%  Weight: 275 lb 4.8 oz (124.9 kg)    Body mass index is 45.81 kg/m.   Physical Exam Vitals reviewed.  Constitutional:      Appearance: Normal appearance. She is obese.  HENT:     Head: Normocephalic and atraumatic.  Eyes:     Conjunctiva/sclera: Conjunctivae normal.  Cardiovascular:     Rate and Rhythm: Normal rate and regular rhythm.  Pulmonary:     Effort: Pulmonary effort is normal.     Breath sounds: Normal breath sounds.  Skin:    General: Skin is warm and dry.  Neurological:     General: No focal deficit present.     Mental Status: She is alert and oriented to person, place, and time.  Psychiatric:        Mood and Affect: Mood normal.        Behavior: Behavior normal.        Thought Content: Thought content normal.  Judgment: Judgment normal.      Impression and Plan:  Type 2 diabetes mellitus with diabetic polyneuropathy, without long-term current use of insulin  (HCC) -     POCT glycosylated hemoglobin (Hb A1C)  Immunization due  Other orders -     Blood Glucose Monitoring Suppl; 1 each by Does not apply route in the morning, at noon, and at bedtime.  May substitute to any manufacturer covered by patient's insurance.  Dispense: 1 each; Refill: 0 -     Blood Glucose Test; 1 each by In Vitro route in the morning, at noon, and at bedtime. May substitute to any manufacturer covered by patient's insurance.  Dispense: 100 strip; Refill: 0 -     Lancet Device; 1 each by Does not apply route in the morning, at noon, and at bedtime. May substitute to any manufacturer covered by patient's insurance.  Dispense: 1 each; Refill: 0 -     Lancets Misc.; 1 each by Does not apply route in the morning, at noon, and at bedtime. May substitute to any manufacturer covered by patient's insurance.  Dispense: 100 each; Refill: 0   `-A1c is stable to slightly improved at 6.2.  No further hypoglycemic episodes, okay to increase Ozempic to 0.5.  Give prescription for glucometer. - Flu and PCV 20 administered in office today.  Time spent:31 minutes reviewing chart, interviewing and examining patient and formulating plan of care.     Tully Theophilus Andrews, MD [facility]

## 2024-05-08 NOTE — Addendum Note (Signed)
 Addended by: KATHRYNE MILLMAN B on: 05/08/2024 02:52 PM   Modules accepted: Orders

## 2024-05-15 ENCOUNTER — Other Ambulatory Visit

## 2024-05-15 ENCOUNTER — Encounter: Admitting: Internal Medicine

## 2024-05-15 ENCOUNTER — Ambulatory Visit: Admitting: Internal Medicine

## 2024-05-15 ENCOUNTER — Other Ambulatory Visit: Payer: Self-pay | Admitting: Internal Medicine

## 2024-05-15 DIAGNOSIS — E782 Mixed hyperlipidemia: Secondary | ICD-10-CM

## 2024-05-15 DIAGNOSIS — E039 Hypothyroidism, unspecified: Secondary | ICD-10-CM

## 2024-05-15 LAB — LIPID PANEL
Cholesterol: 150 mg/dL (ref 0–200)
HDL: 42.7 mg/dL (ref >39.00)
LDL Cholesterol: 63 mg/dL (ref 0–99)
NonHDL: 107.07
Total CHOL/HDL Ratio: 4
Triglycerides: 222 mg/dL — ABNORMAL HIGH (ref 0.0–149.0)
VLDL: 44.4 mg/dL — ABNORMAL HIGH (ref 0.0–40.0)

## 2024-05-15 LAB — TSH: TSH: 4.96 u[IU]/mL (ref 0.35–5.50)

## 2024-05-16 ENCOUNTER — Inpatient Hospital Stay: Attending: Oncology

## 2024-05-16 ENCOUNTER — Encounter: Payer: Self-pay | Admitting: Hematology and Oncology

## 2024-05-16 ENCOUNTER — Ambulatory Visit: Payer: Self-pay | Admitting: Internal Medicine

## 2024-05-30 ENCOUNTER — Other Ambulatory Visit: Payer: Self-pay | Admitting: Internal Medicine

## 2024-06-01 ENCOUNTER — Ambulatory Visit (HOSPITAL_COMMUNITY)
Admission: RE | Admit: 2024-06-01 | Discharge: 2024-06-01 | Disposition: A | Discharge status: Home / Self Care | Source: Ambulatory Visit | Attending: Hematology and Oncology | Admitting: Hematology and Oncology

## 2024-06-01 DIAGNOSIS — J439 Emphysema, unspecified: Secondary | ICD-10-CM | POA: Diagnosis not present

## 2024-06-01 DIAGNOSIS — C3490 Malignant neoplasm of unspecified part of unspecified bronchus or lung: Secondary | ICD-10-CM | POA: Diagnosis not present

## 2024-06-01 DIAGNOSIS — N1832 Chronic kidney disease, stage 3b: Secondary | ICD-10-CM | POA: Diagnosis not present

## 2024-06-01 DIAGNOSIS — I700 Atherosclerosis of aorta: Secondary | ICD-10-CM | POA: Diagnosis not present

## 2024-06-06 DIAGNOSIS — E669 Obesity, unspecified: Secondary | ICD-10-CM | POA: Diagnosis not present

## 2024-06-06 DIAGNOSIS — N1832 Chronic kidney disease, stage 3b: Secondary | ICD-10-CM | POA: Diagnosis not present

## 2024-06-06 DIAGNOSIS — E1122 Type 2 diabetes mellitus with diabetic chronic kidney disease: Secondary | ICD-10-CM | POA: Diagnosis not present

## 2024-06-06 DIAGNOSIS — R609 Edema, unspecified: Secondary | ICD-10-CM | POA: Diagnosis not present

## 2024-06-06 DIAGNOSIS — I129 Hypertensive chronic kidney disease with stage 1 through stage 4 chronic kidney disease, or unspecified chronic kidney disease: Secondary | ICD-10-CM | POA: Diagnosis not present

## 2024-06-08 ENCOUNTER — Other Ambulatory Visit: Payer: Self-pay | Admitting: Internal Medicine

## 2024-06-08 DIAGNOSIS — C3411 Malignant neoplasm of upper lobe, right bronchus or lung: Secondary | ICD-10-CM

## 2024-06-11 ENCOUNTER — Encounter: Payer: Self-pay | Admitting: Podiatry

## 2024-06-11 ENCOUNTER — Ambulatory Visit: Admitting: Podiatry

## 2024-06-11 DIAGNOSIS — M79675 Pain in left toe(s): Secondary | ICD-10-CM | POA: Diagnosis not present

## 2024-06-11 DIAGNOSIS — L84 Corns and callosities: Secondary | ICD-10-CM | POA: Diagnosis not present

## 2024-06-11 DIAGNOSIS — B351 Tinea unguium: Secondary | ICD-10-CM

## 2024-06-11 DIAGNOSIS — M79674 Pain in right toe(s): Secondary | ICD-10-CM

## 2024-06-11 DIAGNOSIS — M2021 Hallux rigidus, right foot: Secondary | ICD-10-CM

## 2024-06-11 DIAGNOSIS — E1151 Type 2 diabetes mellitus with diabetic peripheral angiopathy without gangrene: Secondary | ICD-10-CM

## 2024-06-11 NOTE — Progress Notes (Signed)
 Subjective:  Patient ID: Maria Marquez, female    DOB: 1946-08-15,  MRN: [id number]  Maria Marquez presents to clinic today for:  Chief Complaint  Patient presents with   Diabetes    [facility] Diet control diabetes. A1C 6.2. Toenail trim and Callus care right foot.   Patient notes nails are thick and elongated, causing pain in shoe gear when ambulating.  She has a painful callus right submet 5 that she would like shaved.  She also would like to talk about surgical options for the right great toe joint.  She states cortisone injections typically do not work for her.  She notes that she still gets pain in this joint even with adjustment of her orthotic.  She does note that she lives alone but does have family members that.  Come by on occasion to help her.  She is already aware that she will not be able to drive while wearing the surgical shoe during the postoperative period.  States her diabetes is well-controlled  PCP is Theophilus Andrews, Tully GRADE, MD. 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 macel;  due to chemotherapy   Chronic rhinitis    w/ upper airway wheezing due to nasal drainage per pulmonogy note (dr meade),  using stiolto inhaler   Complication of anesthesia    affects memory for a few days   Diabetes mellitus type 2, diet-controlled (HCC)    followed by pcp;    (04-20-2023 pt has started take farxiga  daily;    per pt check blood sugar multiple times daily w/ Libre,  fasting average 120--125   Dyspnea on exertion    04-20-2023 pt stated sob w/ long  distancewalk and stairs but recovers quickly when stop/ rest,  ok with household chores and short walks   Family history of breast cancer 05/04/2021   Family history of ovarian cancer 05/04/2021   Generalized weakness    GERD (gastroesophageal reflux disease)    History of basal cell carcinoma (BCC) excision    per pt in 1970s on nose   History of chemotherapy    06-13-2020  to 12/ 2021  for right renal pelvis carcinoma   History of colon polyps    History of radiation therapy    11-13-2020 to 11-26-2020---   Right Lung- SBRT- Dr. Lynwood Nasuti   HLD (hyperlipidemia)    Hypertension    followed by pcp   Hypothyroidism, postsurgical 1979   followed by pcp   Leukocytosis    Nocturia    OA (osteoarthritis)    Pneumonia    PONV (postoperative nausea and vomiting)    Since chemo, ponv,   Port-A-Cath in place    Primary adenocarcinoma of upper lobe of right lung [facility]) 09/2020   oncologist--- dr dorsey/  pulmonology-- dr n. meade;  dx 02/ 2022;  s/p  SBRT 11-13-2020 to 11-26-2020   Urgency of urination    wears pads   Varicose veins of both legs with edema  Wears partial dentures    lower   Allergies  Allergen Reactions   Iodine Shortness Of Breath and Other (See Comments)   Keflex  [Cephalexin ] Shortness Of Breath, Rash and Tinitus   Shellfish Allergy Hives   Shellfish Protein-Containing Drug Products Anaphylaxis and Hives   Hydrocodone  Hives and Itching    Itching throat   Rosuvastatin  Other (See Comments)    Muscle Pain in Thighs   Ciprofloxacin  Other (See Comments)   Latex Rash   Lisinopril Cough   Sulfa Antibiotics Nausea Only   Tramadol  Other (See Comments)    hallucinations    Objective:  Maria Marquez is a pleasant 78 y.o. female in NAD. AAO x 3.  Vascular Examination: Patient has palpable DP pulse, absent PT pulse bilateral.  Delayed capillary refill bilateral toes.  Sparse digital hair bilateral.  Proximal to distal cooling WNL bilateral.     Dermatological Examination: Interspaces are clear with no open lesions noted bilateral.  Skin is shiny and atrophic bilateral.  Nails are 3-34mm thick, with yellowish/brown discoloration, subungual debris and distal onycholysis x10.  There is pain with compression of nails x10.  There are hyperkeratotic lesions noted right submet 5.  Orthopedic examination: No significant pain on palpation of the right first MPJ but there is significant pain with range of motion of the first MPJ.  There is decreased range of motion, less than 10 degrees of dorsiflexion noted.     Latest Ref Rng & Units 05/08/2024    1:34 PM 02/16/2024    1:36 PM 10/31/2023   10:45 AM  Hemoglobin A1C  Hemoglobin-A1c 4.0 - 5.6 % 6.2  6.7  7.2    Patient qualifies for at-risk foot care because of diabetes and mild PVD.  Assessment/Plan: 1. Pain due to onychomycosis of toenails of both feet   2. Pre-ulcerative calluses   3. Type II diabetes mellitus with peripheral circulatory disorder (HCC)   4. Hallux rigidus, right foot    Mycotic nails x10 were sharply debrided with sterile nail nippers and power debriding burr to decrease bulk and length.  Hyperkeratotic lesion right submet 5 was shaved with #312 blade.  Reviewed her x-rays once again today from the previous visit.  She is bone-on-bone with the first MPJ on the right and would benefit from a joint destructive procedure.  This would either involve an arthrodesis or a Keller (first MPJ) arthroplasty.  Since she lives alone is diabetic, I would not recommend any arthrodesis of the first MPJ and go with a Keller arthroplasty without the temporary percutaneous K wire fixation.  She will be in a surgical shoe for 3 to 4 weeks but could weight-bear during that time.  Her A1c levels under 7 so there should not be any significant risk of increased complications due to her diabetes.  Return in about 3 months (around 09/11/2024) for [facility].   Maria Marquez, DPM, FACFAS [facility]     [address] [place], [place] [address]                Office [phone]  Fax [phone]

## 2024-06-14 ENCOUNTER — Ambulatory Visit: Payer: Self-pay | Admitting: *Deleted

## 2024-06-14 DIAGNOSIS — Z8551 Personal history of malignant neoplasm of bladder: Secondary | ICD-10-CM | POA: Diagnosis not present

## 2024-06-14 DIAGNOSIS — C651 Malignant neoplasm of right renal pelvis: Secondary | ICD-10-CM | POA: Diagnosis not present

## 2024-06-14 NOTE — Telephone Encounter (Signed)
 TCT patient regarding CT scan results No answer but was able to leave vm message for pt to call back to [phone] to review results. My Chart message sent as well.

## 2024-06-14 NOTE — Telephone Encounter (Signed)
-----   Message from Norleen ONEIDA Kidney IV sent at 06/12/2024  4:01 PM EDT ----- Please let Mrs. 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  Will plan to see her back as scheduled in November 2025. ----- Message ----- From: Rebecka, Rad Results In Sent: 06/04/2024   6:07 AM EDT To: Norleen ONEIDA Kidney [place], MD

## 2024-06-22 ENCOUNTER — Other Ambulatory Visit: Payer: Self-pay | Admitting: Internal Medicine

## 2024-06-22 DIAGNOSIS — K219 Gastro-esophageal reflux disease without esophagitis: Secondary | ICD-10-CM

## 2024-06-26 ENCOUNTER — Encounter: Payer: Self-pay | Admitting: Internal Medicine

## 2024-06-26 ENCOUNTER — Ambulatory Visit (INDEPENDENT_AMBULATORY_CARE_PROVIDER_SITE_OTHER): Admitting: Internal Medicine

## 2024-06-26 VITALS — BP 128/72 | HR 77 | Temp 97.6°F | Ht 66.0 in | Wt 276.0 lb

## 2024-06-26 DIAGNOSIS — J439 Emphysema, unspecified: Secondary | ICD-10-CM | POA: Diagnosis not present

## 2024-06-26 DIAGNOSIS — J441 Chronic obstructive pulmonary disease with (acute) exacerbation: Secondary | ICD-10-CM

## 2024-06-26 DIAGNOSIS — J309 Allergic rhinitis, unspecified: Secondary | ICD-10-CM | POA: Diagnosis not present

## 2024-06-26 DIAGNOSIS — Z8553 Personal history of malignant neoplasm of renal pelvis: Secondary | ICD-10-CM

## 2024-06-26 DIAGNOSIS — Z87891 Personal history of nicotine dependence: Secondary | ICD-10-CM | POA: Diagnosis not present

## 2024-06-26 DIAGNOSIS — J4489 Other specified chronic obstructive pulmonary disease: Secondary | ICD-10-CM

## 2024-06-26 DIAGNOSIS — J310 Chronic rhinitis: Secondary | ICD-10-CM

## 2024-06-26 MED ORDER — IPRATROPIUM-ALBUTEROL 0.5-2.5 (3) MG/3ML IN SOLN
3.0000 mL | Freq: Once | RESPIRATORY_TRACT | Status: AC
  Administered 2024-06-26: 3 mL via RESPIRATORY_TRACT

## 2024-06-26 MED ORDER — AZELASTINE-FLUTICASONE 137-50 MCG/ACT NA SUSP: 1.0000 | Freq: Two times a day (BID) | NASAL | 11 refills | Status: AC

## 2024-06-26 MED ORDER — ALBUTEROL SULFATE (2.5 MG/3ML) 0.083% IN NEBU: 2.5000 mg | INHALATION_SOLUTION | Freq: Four times a day (QID) | RESPIRATORY_TRACT | 12 refills | Status: AC | PRN

## 2024-06-26 NOTE — Progress Notes (Signed)
 Maria Marquez    [id number]    Sep 18, 1945  Primary Care Physician:Hernandez Delma Tully GRADE, MD Date of Appointment: 06/26/2024 Established Patient Visit  Chief complaint:   Chief Complaint  Patient presents with   Shortness of Breath    No improvement. Patient states she has mucus/phlegm in her chest, which interferes with her breathing.     HPI: Maria Marquez is a 78 y.o. woman with mild emphysema, Stage 1a lung cancer s/p resection, LPR, possible OSA.   Interval Updates:  Here for follow up. Worsening allergy symptoms. Runny nose, itchy watery eyes, coughing and chest congestion. Flonase  is helping.   Using stiolto inhaler every day. Using albuterol  more frequently the past 2-3 weeks. Feels like she can't get a full breath in. Albuterol  helps a little bit. Using about twice a day.   No wheezing. No known sick contacts. No fevers or chills.   She does have daytime sleepiness and likely sleep apnea but has hesitant to undergo sleep study.   She has urothelial cancer and is in remission.    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 macel;  due to chemotherapy   Chronic rhinitis    w/ upper airway wheezing due to nasal drainage per pulmonogy note (dr meade),  using stiolto inhaler   Complication of anesthesia    affects memory for a few days   Diabetes mellitus type 2, diet-controlled (HCC)    followed by pcp;    (04-20-2023 pt has started take farxiga  daily;    per pt check blood sugar multiple times daily w/ Libre,  fasting average 120--125   Dyspnea on  exertion    04-20-2023 pt stated sob w/ long distancewalk and stairs but recovers quickly when stop/ rest,  ok with household chores and short walks   Family history of breast cancer 05/04/2021   Family history of ovarian cancer 05/04/2021   Generalized weakness    GERD (gastroesophageal reflux disease)    History of basal cell carcinoma (BCC) excision    per pt in 1970s on nose   History of chemotherapy    06-13-2020  to 12/ 2021  for right renal pelvis carcinoma   History of colon polyps    History of radiation therapy    11-13-2020 to 11-26-2020---   Right Lung- SBRT- Dr. Lynwood Nasuti   HLD (hyperlipidemia)    Hypertension    followed by pcp   Hypothyroidism, postsurgical 1979   followed by pcp   Leukocytosis    Nocturia    OA (osteoarthritis)    Pneumonia    PONV (postoperative nausea and vomiting)    Since chemo, ponv,   Port-A-Cath in place    Primary adenocarcinoma of upper lobe of right lung [facility]) 09/2020   oncologist--- dr dorsey/  pulmonology-- dr n. meade;  dx 02/ 2022;  s/p  SBRT 11-13-2020 to 11-26-2020   Urgency of urination    wears pads   Varicose veins of both legs with edema  Wears partial dentures    lower    Past Surgical History:  Procedure Laterality Date   COLONOSCOPY  last one 2017   CYSTOSCOPY W/ URETERAL STENT PLACEMENT Right 01/27/2022   Procedure: CYSTOSCOPY WITH RETROGRADE PYELOGRAM/ URETEROSCOPY/POSSIBLE BIOPSY/ POSSIBLE  URETERAL STENT PLACEMENT;  Surgeon: Devere Lonni Righter, MD;  Location: [facility];  Service: Urology;  Laterality: Right;   CYSTOSCOPY WITH BIOPSY Right 09/16/2021   Procedure: CYSTOSCOPY / URETEROSCOPY WITH BIOPSY/bugbee fulgeration/right retrograde;  Surgeon: Devere Lonni Righter, MD;  Location: [facility];  Service: Urology;  Laterality: Right;   CYSTOSCOPY WITH BIOPSY N/A 11/02/2023   Procedure: CYSTOSCOPY WITH BIOPSY;  Surgeon: Devere Lonni Righter, MD;  Location: [facility];   Service: Urology;  Laterality: N/A;   CYSTOSCOPY WITH BIOPSY Right 03/07/2024   Procedure: CYSTOSCOPY, WITH BIOPSY;  Surgeon: Devere Lonni Righter, MD;  Location: [facility];  Service: Urology;  Laterality: Right;   CYSTOSCOPY WITH RETROGRADE PYELOGRAM, URETEROSCOPY AND STENT PLACEMENT Right 04/27/2023   Procedure: CYSTOSCOPY, WITH RIGHT RETROGRADE PYELOGRAM, RIGHT URETEROSCOPY, RIGHT URETERAL STENT PLACEMENT, FULGURATION OF RIGHT RENAL PELVIS TUMORS;  Surgeon: Devere Lonni Righter, MD;  Location: [facility];  Service: Urology;  Laterality: Right;   CYSTOSCOPY WITH STENT PLACEMENT Right 10/01/2022   Procedure: CYSTOSCOPY WITH STENT PLACEMENT;  Surgeon: Devere Lonni Righter, MD;  Location: [facility];  Service: Urology;  Laterality: Right;   CYSTOSCOPY WITH URETEROSCOPY Right 04/18/2020   Procedure: CYSTOSCOPY WITH URETEROSCOPY, BIOPSY;  Surgeon: Devere Lonni Righter, MD;  Location: [facility];  Service: Urology;  Laterality: Right;  ONLY NEEDS 45 MIN   CYSTOSCOPY WITH URETEROSCOPY Right 06/02/2022   Procedure: CYSTOSCOPY WITH URETEROSCOPY;  Surgeon: Devere Lonni Righter, MD;  Location: [facility];  Service: Urology;  Laterality: Right;  ONLY NEEDS 60 MINS   CYSTOSCOPY WITH URETEROSCOPY AND STENT PLACEMENT N/A 12/31/2020   Procedure: CYSTOSCOPY WITH RIGHT URETEROSCOPY/ BILATERAL RETROGRADE PYELOGRAM/RIGHT URETERAL BIOPSY/ LASER ABLATION/RIGHT STENT PLACEMENT;  Surgeon: Devere Lonni Righter, MD;  Location: [facility];  Service: Urology;  Laterality: N/A;   CYSTOSCOPY/RETROGRADE/URETEROSCOPY Right 04/22/2021   Procedure: CYSTOSCOPY/RETROGRADE/URETEROSCOPY/ STENT PLACEMENT;  Surgeon: Devere Lonni Righter, MD;  Location: [facility];  Service: Urology;  Laterality: Right;   CYSTOSCOPY/URETEROSCOPY/HOLMIUM LASER/STENT PLACEMENT Right 11/02/2023   Procedure: CYSTOSCOPY/RIGHT URETEROSCOPY/RETROGRADE PYELOGRAM/  LASER ABLATION/;  Surgeon: Devere Lonni Righter, MD;  Location: [facility];  Service: Urology;  Laterality: Right;  45 MINUTES NEEDED   CYSTOSCOPY/URETEROSCOPY/HOLMIUM LASER/STENT PLACEMENT Right 03/07/2024   Procedure: CYSTOSCOPY/URETEROSCOPY/HOLMIUM LASER TUMOR ABALTION/STENT PLACEMENT/TURBT;  Surgeon: Devere Lonni Righter, MD;  Location: [facility];  Service: Urology;  Laterality: Right;  CYSTOSCOPY WITH RIGHT RETROGRADE PYELOGRAM, URETEROSCOPY, POSSIBLE BIOPSY WITH LASER FULGURATION, POSSIBLE STENT PLACEMENT   EYE SURGERY Bilateral 1987   tear ducts   IR IMAGING GUIDED PORT INSERTION  06/04/2020   IR NEPHROSTOMY PLACEMENT RIGHT  03/03/2022   THYROID  LOBECTOMY Right 1979   TOTAL HIP ARTHROPLASTY Right 01/28/2016   Procedure: RIGHT TOTAL HIP ARTHROPLASTY ANTERIOR APPROACH;  Surgeon: Dempsey Moan, MD;  Location: [facility];  Service: Orthopedics;  Laterality: Right;   UMBILICAL HERNIA REPAIR  1980s   URETEROSCOPY Right 10/01/2022   Procedure: URETEROSCOPY WITH BIOPSY pylegram;  Surgeon: Devere Lonni Righter, MD;  Location: [facility];  Service: Urology;  Laterality: Right;  45 MINS   VIDEO BRONCHOSCOPY WITH ENDOBRONCHIAL NAVIGATION N/A 06/06/2020   Procedure: VIDEO BRONCHOSCOPY WITH ENDOBRONCHIAL NAVIGATION;  Surgeon: Shelah Lamar RAMAN, MD;  Location: [facility];  Service: Thoracic;  Laterality: N/A;   VIDEO BRONCHOSCOPY WITH ENDOBRONCHIAL NAVIGATION N/A 10/10/2020   Procedure: VIDEO BRONCHOSCOPY WITH ENDOBRONCHIAL NAVIGATION;  Surgeon: Kerrin Elspeth BROCKS, MD;  Location: [facility];  Service: Thoracic;  Laterality: N/A;    Family History  Problem Relation Age of Onset   Arthritis Mother    Breast cancer Mother 65   Schizophrenia Mother    Diabetes Father    Heart disease Father    Kidney disease Father    CAD Father    Stroke Sister    Lung cancer Brother        dx > 50; work-related exposures   Heart disease Brother    Heart disease Brother    Vision loss Brother         Glaucoma   Alcohol abuse Brother    Ovarian cancer Daughter 3   Lung cancer Maternal Aunt        dx > 50   Colon cancer Paternal Aunt        dx > 50   Head & neck cancer Paternal Uncle        dx 47s   Leukemia Cousin        d. 16s   Breast cancer Niece 55    Social History   Occupational History   Occupation: Retired  Tobacco Use   Smoking status: Former    Current packs/day: 0.00    Average packs/day: 1 pack/day for 45.0 years (45.0 ttl pk-yrs)    Types: Cigarettes    Start date: 04/19/1975    Quit date: 04/18/2020    Years since quitting: 4.1   Smokeless tobacco: Never  Vaping Use   Vaping status: Never Used  Substance and Sexual Activity   Alcohol use: Not Currently    Comment: rare   Drug use: No   Sexual activity: Not Currently     Physical Exam: Blood pressure (!) 151/68, pulse 77, temperature 97.6 F (36.4 C), temperature source Oral, height 5' 6 (1.676 m), weight 276 lb (125.2 kg), SpO2 95%.  Gen:      No distress, no increased work of breathing ENT:  mmm no wheeze, mallampati IV Lungs:   ctab no wheeze CV:       RRR no mrg   Data Reviewed: Imaging: I have personally reviewed the CT Chest April 2024 - stable RUL post radiation changes. Stable 5mm nodule and new 3mm RUL nodule.   PFTs:     Latest Ref Rng & Units 10/09/2020   11:45 AM  PFT Results  FVC-Pre L 3.00   FVC-Predicted Pre % 94   FVC-Post L 3.00   FVC-Predicted Post % 94   Pre FEV1/FVC % % 77   Post FEV1/FCV % % 79   FEV1-Pre L 2.32   FEV1-Predicted Pre % 96   FEV1-Post L 2.36   DLCO uncorrected ml/min/mmHg 14.75   DLCO UNC% % 71   DLCO corrected ml/min/mmHg 15.94   DLCO COR %Predicted % 77   DLVA Predicted % 83   TLC L 5.79   TLC % Predicted % 108   RV % Predicted % 111    I have personally reviewed the patient's PFTs and normal pulmonary function  Labs: Lab Results  Component Value Date   WBC 7.3 04/06/2024   HGB 13.0 04/06/2024   HCT 38.8 04/06/2024   MCV 87.8  04/06/2024   PLT 239 04/06/2024   Lab Results  Component Value Date   NA 138 04/26/2024  K 4.2 04/26/2024   CL 99 04/26/2024   CO2 22 04/26/2024     Immunization status: Immunization History  Administered Date(s) Administered    sv, Bivalent, Protein Subunit Rsvpref,pf (Abrysvo) 05/18/2023   Fluad Quad(high Dose 65+) 04/28/2016, 06/14/2019, 07/11/2020, 05/03/2022, 05/04/2023   INFLUENZA, HIGH DOSE SEASONAL PF 04/28/2016, 07/19/2017, 05/19/2018, 05/15/2021, 05/08/2024   Influenza, Seasonal, Injecte, Preservative Fre 04/25/2015   Influenza-Unspecified 04/25/2015, 04/28/2016, 07/12/2017, 05/25/2021, 05/03/2022   Janssen (J&J) SARS-COV-2 Vaccination 04/22/2020   Moderna Sars-Covid-2 Vaccination 05/15/2021   PFIZER(Purple Top)SARS-COV-2 Vaccination 07/03/2020, 01/13/2021   PNEUMOCOCCAL CONJUGATE-20 05/08/2024   Pneumococcal Conjugate-13 06/13/2015   Pneumococcal Polysaccharide-23 06/10/2016   Pneumococcal-Unspecified 08/27/2019   Tdap 06/16/2014   Zoster Recombinant(Shingrix ) 01/08/2020, 05/13/2020   Zoster, Live 05/27/2014    External Records Personally Reviewed: oncology, family medicine  Assessment:  Mild COPD with exacerbation Uncontrolled allergic rhinitis History of tobacco use disorder Radiographic emphysema, mild centrilobular Stage 1a lung cancer of the RUL in remission Urothelial cancer - in remission Suspected OSA  Plan/Recommendations:  Sorry you aren't feeling well.  Nebulizer treatment, duoneb, given in office today.   Continue the stiolto inhaler 2 puffs daily.  Continue the albuterol  inhaler as needed. I am changing your flonase  to flonase -astelin combination spray. Take 1 spray each side twice a day.  I will send a nebulizer machine your house through a DME company. You can pick up the vials of medication from the pharmacy.     Hope you feel better soon! Let me know when you're ready for a sleep study.     Return to Care: Return in about 6  months (around 12/24/2024) for MD visit Dr Annella.   Verdon Gore, MD Pulmonary and Critical Care Medicine [facility] Office:[phone]

## 2024-06-26 NOTE — Patient Instructions (Addendum)
 It was a pleasure to see you today!  Please schedule follow up with Dr Annella in 6 months.  If my schedule is not open yet, we will contact you with a reminder closer to that time. Please call [phone] if you haven't heard from us  a month before, and always call us  sooner if issues or concerns arise. You can also send us  a message through MyChart, but but aware that this is not to be used for urgent issues and it may take up to 5-7 days to receive a reply. Please be aware that you will likely be able to view your results before I have a chance to respond to them. Please give us  5 business days to respond to any non-urgent results.     Sorry you aren't feeling well.  Continue the stiolto inhaler 2 puffs daily.  Continue the albuterol  inhaler as needed. I am changing your flonase  to flonase -astelin combination spray. Take 1 spray each side twice a day.  I will send a nebulizer machine your house through a DME company. You can pick up the vials of medication from the pharmacy.   Hope you feel better soon! Let me know when you're ready for a sleep study.

## 2024-06-27 ENCOUNTER — Telehealth: Payer: Self-pay

## 2024-06-27 ENCOUNTER — Other Ambulatory Visit (HOSPITAL_COMMUNITY): Payer: Self-pay

## 2024-06-27 NOTE — Telephone Encounter (Signed)
*  Pulm  Pharmacy Patient Advocate Encounter   Received notification from CoverMyMeds that prior authorization for Azelastine-Fluticasone  137-50MCG/ACT suspension  is required/requested.   Insurance verification completed.   The patient is insured through [employer].   Per test claim: PA required; PA started via CoverMyMeds. KEY [id number] . Waiting for clinical questions to populate.

## 2024-06-28 ENCOUNTER — Other Ambulatory Visit: Payer: Self-pay | Admitting: Internal Medicine

## 2024-06-28 NOTE — Telephone Encounter (Signed)
 Clinical questions populated and answered, pending determination

## 2024-06-28 NOTE — Telephone Encounter (Signed)
 Copied from CRM #[id number]. Topic: Clinical - Medication Refill >> Jun 28, 2024  4:25 PM Rilla B wrote: Medication:  albuterol  (PROVENTIL ) (2.5 MG/3ML) 0.083% nebulizer solution  Has the patient contacted their pharmacy? Yes (Agent: If no, request that the patient contact the pharmacy for the refill. If patient does not wish to contact the pharmacy document the reason why and proceed with request.) (Agent: If yes, when and what did the pharmacy advise?)  This is the patient's preferred pharmacy:  [facility] # [id number], [place] - [address] MULLIGAN [place] [address] Phone: [phone] Fax: [phone]  Is this the correct pharmacy for this prescription? Yes If no, delete pharmacy and type the correct one.   Has the prescription been filled recently? No  Is the patient out of the medication? Yes  Has the patient been seen for an appointment in the last year OR does the patient have an upcoming appointment? Yes  Can we respond through MyChart? Yes  Agent: Please be advised that Rx refills may take up to 3 business days. We ask that you follow-up with your pharmacy.

## 2024-06-28 NOTE — Telephone Encounter (Signed)
 The drug you asked for is not listed in your preferred drug list (formulary). The preferred drug(s), you may not have tried, are: azelastine 137 mcg (0.1 %) nasal spray ipratropium bromide nasal spray

## 2024-06-28 NOTE — Telephone Encounter (Signed)
 Please advise, Dr Meade

## 2024-06-29 MED ORDER — AZELASTINE HCL 0.1 % NA SOLN: 1.0000 | Freq: Two times a day (BID) | NASAL | 11 refills | Status: AC

## 2024-06-29 MED ORDER — FLUTICASONE PROPIONATE 50 MCG/ACT NA SUSP: 1.0000 | Freq: Two times a day (BID) | NASAL | 11 refills | Status: AC

## 2024-06-29 NOTE — Telephone Encounter (Signed)
 Meade Verdon RAMAN, MD to [facility] Clinical     06/29/24  8:02 AM Can you please prescribe astelin and flonase  separately? And instruct patient to take on in am, one at night. 1 spray each nare. Combined was not covered.   ND

## 2024-06-29 NOTE — Addendum Note (Signed)
 Addended by: Ranen Doolin M on: 06/29/2024 09:52 AM   Modules accepted: Orders

## 2024-06-29 NOTE — Telephone Encounter (Signed)
 Spoke with the pt and notified of response per Dr Meade. She verbalized understanding. Rxs were sent to preferred pharm.

## 2024-07-04 ENCOUNTER — Inpatient Hospital Stay

## 2024-07-04 ENCOUNTER — Other Ambulatory Visit: Payer: Self-pay | Admitting: Hematology and Oncology

## 2024-07-04 ENCOUNTER — Inpatient Hospital Stay: Attending: Oncology | Admitting: Hematology and Oncology

## 2024-07-04 VITALS — BP 165/68 | HR 72 | Temp 97.9°F | Resp 18 | Wt 273.9 lb

## 2024-07-04 DIAGNOSIS — Z85118 Personal history of other malignant neoplasm of bronchus and lung: Secondary | ICD-10-CM | POA: Diagnosis not present

## 2024-07-04 DIAGNOSIS — C659 Malignant neoplasm of unspecified renal pelvis: Secondary | ICD-10-CM | POA: Diagnosis not present

## 2024-07-04 DIAGNOSIS — C3490 Malignant neoplasm of unspecified part of unspecified bronchus or lung: Secondary | ICD-10-CM

## 2024-07-04 DIAGNOSIS — Z08 Encounter for follow-up examination after completed treatment for malignant neoplasm: Secondary | ICD-10-CM | POA: Diagnosis not present

## 2024-07-04 DIAGNOSIS — N1831 Chronic kidney disease, stage 3a: Secondary | ICD-10-CM | POA: Diagnosis not present

## 2024-07-04 DIAGNOSIS — Z8553 Personal history of malignant neoplasm of renal pelvis: Secondary | ICD-10-CM | POA: Diagnosis not present

## 2024-07-04 LAB — CMP (CANCER CENTER ONLY)
ALT: 12 U/L (ref 10–47)
AST: 11 U/L (ref 11–38)
Albumin: 3.8 g/dL (ref 3.5–5.0)
Alkaline Phosphatase: 84 U/L (ref 38–126)
Anion gap: 7 (ref 5–15)
BUN: 24 mg/dL — ABNORMAL HIGH (ref 8–23)
CO2: 26 mmol/L (ref 22–32)
Calcium: 8.9 mg/dL (ref 8.9–10.3)
Chloride: 105 mmol/L (ref 98–111)
Creatinine: 1.44 mg/dL — ABNORMAL HIGH (ref 0.60–1.20)
Glucose, Bld: 121 mg/dL — ABNORMAL HIGH (ref 70–99)
Potassium: 3.8 mmol/L (ref 3.5–5.1)
Sodium: 138 mmol/L (ref 135–145)
Total Bilirubin: 0.9 mg/dL (ref 0.2–1.6)
Total Protein: 6.6 g/dL (ref 6.5–8.1)

## 2024-07-04 LAB — CBC WITH DIFFERENTIAL (CANCER CENTER ONLY)
Abs Immature Granulocytes: 0.01 K/uL (ref 0.00–0.07)
Basophils Absolute: 0 K/uL (ref 0.0–0.1)
Basophils Relative: 0 %
Eosinophils Absolute: 0.3 K/uL (ref 0.0–0.5)
Eosinophils Relative: 3 %
HCT: 38.8 % (ref 36.0–46.0)
Hemoglobin: 12.9 g/dL (ref 12.0–15.0)
Immature Granulocytes: 0 %
Lymphocytes Relative: 19 %
Lymphs Abs: 1.5 K/uL (ref 0.7–4.0)
MCH: 29.7 pg (ref 26.0–34.0)
MCHC: 33.2 g/dL (ref 30.0–36.0)
MCV: 89.2 fL (ref 80.0–100.0)
Monocytes Absolute: 0.8 K/uL (ref 0.1–1.0)
Monocytes Relative: 10 %
Neutro Abs: 5.4 K/uL (ref 1.7–7.7)
Neutrophils Relative %: 68 %
Platelet Count: 235 K/uL (ref 150–400)
RBC: 4.35 MIL/uL (ref 3.87–5.11)
RDW: 14.4 % (ref 11.5–15.5)
WBC Count: 8 K/uL (ref 4.0–10.5)
nRBC: 0 % (ref 0.0–0.2)

## 2024-07-04 NOTE — Progress Notes (Signed)
 [facility] [phone]   [phone]  PROGRESS NOTE  Patient Care Team: Theophilus Andrews, Tully GRADE, MD as PCP - General (Internal Medicine) Aneita Gwendlyn DASEN, MD (Inactive) as Consulting Physician (Gastroenterology) Wilder Glean Cassis as Consulting Physician (Surgery) Melodi Lerner, MD as Consulting Physician (Orthopedic Surgery) Frutoso Luz, MD as Referring Physician (Allergy) Sharron Wyatt SQUIBB, PsyD as Counselor (Psychology) Gaston Hamilton, MD as Consulting Physician (Urology) Lionell Jon DEL, [facility] (Pharmacist) Marcey Elspeth PARAS, MD as Consulting Physician (Ophthalmology)  Hematological/Oncological History #  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  She completed 3 cycles and cycle 4 was given without cisplatin  completed in December 2021.  10/10/2020: adenocarcinoma of the lung diagnosed  11/13/2020-11/26/2020: SBRT to right lung cancer, received total of 50 Gray and 10 fractions.  04/20/2022: last visit with Dr. Amadeo  10/07/2022: transition care to Dr. Federico   Interval History:  Maria Marquez 78 y.o. 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 The patient's last visit was on 04/06/2024. In the interim since the last visit she has had no major changes in her health.  On exam today Maria Marquez reports she is relieved to hear that her recent CT scan showed no evidence of residual or recurrent disease.  She reports that she has continued to feel tired.  She was to have a cystoscopy here soon but spoke with urology and 1 to have a delay until January 2026.  Recently she had changes with her blood pressure medications through her  nephrologist and has had a sharp increase in her blood pressure.  She notes that she has reached out to him for adding back on certain medications.  She continues to use Ozempic and feels like she is having a lot of water  retention as a result of it.  She is only lost a few pounds.  She reports that she is not currently having any urinary issues and denies any change in the color.  She reports her energy levels continue to remain low but she does sleep poorly at night.  Her appetite remains strong.  Overall she is steady and has no additional questions concerns or complaints today.  Full 10 point ROS is otherwise negative.  MEDICAL HISTORY:  Past Medical History:  Diagnosis Date   Anemia associated with chemotherapy    followed by dr federico   Carcinoma of renal pelvis, right [facility]) 03/2020   urologist-- dr winter/  oncologist-- dr federico;   dx 08/ 2021 ;  chemo 10/ 2021  to 12/ 2021 and complete laser tumor ablation;  recurrent 05/ 2023   completed 6 mitomyocin gel infusions via nephrostomy tube 08/ 2023   Chronic kidney disease, stage 3a (HCC) 12/31/2019   nephrologist--- dr s. macel;  due to chemotherapy   Chronic rhinitis    w/ upper airway wheezing due to nasal drainage per pulmonogy note (dr meade),  using stiolto inhaler   Complication of anesthesia    affects memory for a few days   Diabetes mellitus type 2, diet-controlled (HCC)    followed by pcp;    (04-20-2023 pt has started take farxiga  daily;    per pt check blood sugar multiple times daily w/ Libre,  fasting average 120--125   Dyspnea on  exertion    04-20-2023 pt stated sob w/ long distancewalk and stairs but recovers quickly when stop/ rest,  ok with household chores and short walks   Family history of breast cancer 05/04/2021   Family history of ovarian cancer 05/04/2021   Generalized weakness    GERD (gastroesophageal reflux disease)    History of basal cell carcinoma (BCC) excision    per pt in 1970s on nose   History of  chemotherapy    06-13-2020  to 12/ 2021  for right renal pelvis carcinoma   History of colon polyps    History of radiation therapy    11-13-2020 to 11-26-2020---   Right Lung- SBRT- Dr. Lynwood Nasuti   HLD (hyperlipidemia)    Hypertension    followed by pcp   Hypothyroidism, postsurgical 1979   followed by pcp   Leukocytosis    Nocturia    OA (osteoarthritis)    Pneumonia    PONV (postoperative nausea and vomiting)    Since chemo, ponv,   Port-A-Cath in place    Primary adenocarcinoma of upper lobe of right lung [facility]) 09/2020   oncologist--- dr Juno Bozard/  pulmonology-- dr n. meade;  dx 02/ 2022;  s/p  SBRT 11-13-2020 to 11-26-2020   Urgency of urination    wears pads   Varicose veins of both legs with edema    Wears partial dentures    lower    SURGICAL HISTORY: Past Surgical History:  Procedure Laterality Date   COLONOSCOPY  last one 2017   CYSTOSCOPY W/ URETERAL STENT PLACEMENT Right 01/27/2022   Procedure: CYSTOSCOPY WITH RETROGRADE PYELOGRAM/ URETEROSCOPY/POSSIBLE BIOPSY/ POSSIBLE  URETERAL STENT PLACEMENT;  Surgeon: Devere Lonni Righter, MD;  Location: [facility];  Service: Urology;  Laterality: Right;   CYSTOSCOPY WITH BIOPSY Right 09/16/2021   Procedure: CYSTOSCOPY / URETEROSCOPY WITH BIOPSY/bugbee fulgeration/right retrograde;  Surgeon: Devere Lonni Righter, MD;  [facility];  Service: Urology;  Laterality: Right;   CYSTOSCOPY WITH BIOPSY N/A 11/02/2023   Procedure: CYSTOSCOPY WITH BIOPSY;  Surgeon: Devere Lonni Righter, MD;  Location: [facility];  Service: Urology;  Laterality: N/A;   CYSTOSCOPY WITH BIOPSY Right 03/07/2024   Procedure: CYSTOSCOPY, WITH BIOPSY;  Surgeon: Devere Lonni Righter, MD;  Location: [facility];  Service: Urology;  Laterality: Right;   CYSTOSCOPY WITH RETROGRADE PYELOGRAM, URETEROSCOPY AND STENT PLACEMENT Right 04/27/2023   Procedure: CYSTOSCOPY, WITH RIGHT RETROGRADE PYELOGRAM, RIGHT URETEROSCOPY,  RIGHT URETERAL STENT PLACEMENT, FULGURATION OF RIGHT RENAL PELVIS TUMORS;  Surgeon: Devere Lonni Righter, MD;  Location: [facility];  Service: Urology;  Laterality: Right;   CYSTOSCOPY WITH STENT PLACEMENT Right 10/01/2022   Procedure: CYSTOSCOPY WITH STENT PLACEMENT;  Surgeon: Devere Lonni Righter, MD;  Location: [facility];  Service: Urology;  Laterality: Right;   CYSTOSCOPY WITH URETEROSCOPY Right 04/18/2020   Procedure: CYSTOSCOPY WITH URETEROSCOPY, BIOPSY;  Surgeon: Devere Lonni Righter, MD;  Location: [facility];  Service: Urology;  Laterality: Right;  ONLY NEEDS 45 MIN   CYSTOSCOPY WITH URETEROSCOPY Right 06/02/2022   Procedure: CYSTOSCOPY WITH URETEROSCOPY;  Surgeon: Devere Lonni Righter, MD;  Location: [facility];  Service: Urology;  Laterality: Right;  ONLY NEEDS 60 MINS   CYSTOSCOPY WITH URETEROSCOPY AND STENT PLACEMENT N/A 12/31/2020   Procedure: CYSTOSCOPY WITH RIGHT URETEROSCOPY/ BILATERAL RETROGRADE PYELOGRAM/RIGHT URETERAL BIOPSY/ LASER ABLATION/RIGHT STENT PLACEMENT;  Surgeon: Devere Lonni Righter, MD;  Location: [facility];  Service: Urology;  Laterality: N/A;   CYSTOSCOPY/RETROGRADE/URETEROSCOPY Right 04/22/2021  Procedure: CYSTOSCOPY/RETROGRADE/URETEROSCOPY/ STENT PLACEMENT;  Surgeon: Devere Lonni Righter, MD;  [facility];  Service: Urology;  Laterality: Right;   CYSTOSCOPY/URETEROSCOPY/HOLMIUM LASER/STENT PLACEMENT Right 11/02/2023   Procedure: CYSTOSCOPY/RIGHT URETEROSCOPY/RETROGRADE PYELOGRAM/ LASER ABLATION/;  Surgeon: Devere Lonni Righter, MD;  Location: [facility];  Service: Urology;  Laterality: Right;  45 MINUTES NEEDED   CYSTOSCOPY/URETEROSCOPY/HOLMIUM LASER/STENT PLACEMENT Right 03/07/2024   Procedure: CYSTOSCOPY/URETEROSCOPY/HOLMIUM LASER TUMOR ABALTION/STENT PLACEMENT/TURBT;  Surgeon: Devere Lonni Righter, MD;  Location: [facility];  Service: Urology;   Laterality: Right;  CYSTOSCOPY WITH RIGHT RETROGRADE PYELOGRAM, URETEROSCOPY, POSSIBLE BIOPSY WITH LASER FULGURATION, POSSIBLE STENT PLACEMENT   EYE SURGERY Bilateral 1987   tear ducts   IR IMAGING GUIDED PORT INSERTION  06/04/2020   IR NEPHROSTOMY PLACEMENT RIGHT  03/03/2022   THYROID  LOBECTOMY Right 1979   TOTAL HIP ARTHROPLASTY Right 01/28/2016   Procedure: RIGHT TOTAL HIP ARTHROPLASTY ANTERIOR APPROACH;  Surgeon: Dempsey Moan, MD;  Location: [facility];  Service: Orthopedics;  Laterality: Right;   UMBILICAL HERNIA REPAIR  1980s   URETEROSCOPY Right 10/01/2022   Procedure: URETEROSCOPY WITH BIOPSY pylegram;  Surgeon: Devere Lonni Righter, MD;  Location: [facility];  Service: Urology;  Laterality: Right;  45 MINS   VIDEO BRONCHOSCOPY WITH ENDOBRONCHIAL NAVIGATION N/A 06/06/2020   Procedure: VIDEO BRONCHOSCOPY WITH ENDOBRONCHIAL NAVIGATION;  Surgeon: Shelah Lamar RAMAN, MD;  Location: [facility];  Service: Thoracic;  Laterality: N/A;   VIDEO BRONCHOSCOPY WITH ENDOBRONCHIAL NAVIGATION N/A 10/10/2020   Procedure: VIDEO BRONCHOSCOPY WITH ENDOBRONCHIAL NAVIGATION;  Surgeon: Kerrin Elspeth BROCKS, MD;  Location: [facility];  Service: Thoracic;  Laterality: N/A;    SOCIAL HISTORY: Social History   Socioeconomic History   Marital status: Widowed    Spouse name: Not on file   Number of children: Not on file   Years of education: Not on file   Highest education level: Associate degree: occupational, scientist, product/process development, or vocational program  Occupational History   Occupation: Retired  Tobacco Use   Smoking status: Former    Current packs/day: 0.00    Average packs/day: 1 pack/day for 45.0 years (45.0 ttl pk-yrs)    Types: Cigarettes    Start date: 04/19/1975    Quit date: 04/18/2020    Years since quitting: 4.2   Smokeless tobacco: Never  Vaping Use   Vaping status: Never Used  Substance and Sexual Activity   Alcohol use: Not Currently    Comment: rare   Drug use: No   Sexual activity: Not  Currently  Other Topics Concern   Not on file  Social History Narrative   Not on file   Social Drivers of Health   Financial Resource Strain: Low Risk  (02/13/2024)   Overall Financial Resource Strain (CARDIA)    Difficulty of Paying Living Expenses: Not hard at all  Food Insecurity: No Food Insecurity (02/13/2024)   Hunger Vital Sign    Worried About Running Out of Food in the Last Year: Never true    Ran Out of Food in the Last Year: Never true  Transportation Needs: No Transportation Needs (02/13/2024)   PRAPARE - Administrator, Civil Service (Medical): No    Lack of Transportation (Non-Medical): No  Physical Activity: Inactive (02/13/2024)   Exercise Vital Sign    Days of Exercise per Week: 0 days    Minutes of Exercise per Session: Not on file  Stress: No Stress Concern Present (02/13/2024)   [employer] of Occupational Health - Occupational Stress Questionnaire    Feeling of  Stress: Not at all  Social Connections: Socially Isolated (02/13/2024)   Social Connection and Isolation Panel    Frequency of Communication with Friends and Family: Twice a week    Frequency of Social Gatherings with Friends and Family: Once a week    Attends Religious Services: Never    Database Administrator or Organizations: No    Attends Engineer, Structural: Not on file    Marital Status: Widowed  Intimate Partner Violence: Not At Risk (01/25/2024)   Humiliation, Afraid, Rape, and Kick questionnaire    Fear of Current or Ex-Partner: No    Emotionally Abused: No    Physically Abused: No    Sexually Abused: No    FAMILY HISTORY: Family History  Problem Relation Age of Onset   Arthritis Mother    Breast cancer Mother 68   Schizophrenia Mother    Diabetes Father    Heart disease Father    Kidney disease Father    CAD Father    Stroke Sister    Lung cancer Brother        dx > 50; work-related exposures   Heart disease Brother    Heart disease Brother    Vision  loss Brother        Glaucoma   Alcohol abuse Brother    Ovarian cancer Daughter 28   Lung cancer Maternal Aunt        dx > 50   Colon cancer Paternal Aunt        dx > 50   Head & neck cancer Paternal Uncle        dx 23s   Leukemia Cousin        d. 47s   Breast cancer Niece 55    ALLERGIES:  is allergic to iodine, keflex  [cephalexin ], shellfish allergy, shellfish protein-containing drug products, hydrocodone , rosuvastatin , ciprofloxacin , latex, lisinopril, sulfa antibiotics, and tramadol .  MEDICATIONS:  Current Outpatient Medications  Medication Sig Dispense Refill   acetaminophen  (TYLENOL ) 500 MG tablet Take 1,500 mg by mouth every 6 (six) hours as needed for moderate pain (pain score 4-6).     albuterol  (PROVENTIL ) (2.5 MG/3ML) 0.083% nebulizer solution Take 3 mLs (2.5 mg total) by nebulization every 6 (six) hours as needed for wheezing or shortness of breath. 75 mL 12   albuterol  (VENTOLIN  HFA) 108 (90 Base) MCG/ACT inhaler INHALE ONE OR TWO PUFFS BY MOUTH INTO LUNGS EVERY SIX HOURS AS NEEDED FOR WHEEZING/SHORTNESS OF BREATH 8.5 g 2   atorvastatin  (LIPITOR) 20 MG tablet TAKE 1 TABLET EVERY DAY (NEED APPOINTMENT) 90 tablet 3   azelastine (ASTELIN) 0.1 % nasal spray Place 1 spray into both nostrils 2 (two) times daily. Use in each nostril as directed 30 mL 11   Azelastine-Fluticasone  137-50 MCG/ACT SUSP Place 1 spray into the nose in the morning and at bedtime. 23 g 11   Blood Glucose Monitoring Suppl DEVI 1 each by Does not apply route in the morning, at noon, and at bedtime. May substitute to any manufacturer covered by patient's insurance. 1 each 0   Continuous Blood Gluc Receiver (FREESTYLE LIBRE 3 READER) DEVI 1 each by Does not apply route daily. 1 each 3   Continuous Glucose Sensor (FREESTYLE LIBRE 3 SENSOR) MISC PLACE ONE SENSOR ONTO THE SKIN ONCE EVERY 14 DAYS; *CHECK GLUCOSE CONTINUOUSLY* REMOVE OLD SENSOR 2 each 3   dapagliflozin  propanediol (FARXIGA ) 10 MG TABS tablet Take 10  mg by mouth daily.     dapagliflozin  propanediol (FARXIGA ) 5  MG TABS tablet Take 1 tablet (5 mg total) by mouth daily before breakfast. 90 tablet 1   EPINEPHRINE  0.3 mg/0.3 mL IJ SOAJ injection INJECT 0.3MG  INTO THE MUSCLE AS NEEDED FOR ANAPHYLAXIS 2 each 0   estradiol -norethindrone  (ACTIVELLA) 1-0.5 MG tablet Take 1 tablet by mouth daily. 28 tablet 3   ezetimibe  (ZETIA ) 10 MG tablet TAKE 1 TABLET EVERY DAY (NEED MD APPOINTMENT FOR REFILLS) 90 tablet 3   fluticasone  (FLONASE ) 50 MCG/ACT nasal spray Place 1 spray into both nostrils in the morning and at bedtime. 16 g 11   furosemide  (LASIX ) 40 MG tablet Take 40 mg by mouth 2 (two) times daily as needed for fluid or edema.     glucose 4 GM chewable tablet Chew 1 tablet (4 g total) by mouth as needed for low blood sugar. 30 tablet 2   levothyroxine  (SYNTHROID ) 150 MCG tablet Take 1 tablet (150 mcg total) by mouth daily before breakfast. 90 tablet 1   lidocaine -prilocaine  (EMLA ) cream Apply 1 Application topically as needed. 30 g 0   losartan -hydrochlorothiazide  (HYZAAR) 100-25 MG tablet Take 1 tablet by mouth daily. (Patient taking differently: Take 1 tablet by mouth daily. 50 mg daily with no hydrochlorothiazide .)     ondansetron  (ZOFRAN -ODT) 8 MG disintegrating tablet Take 8 mg by mouth every 8 (eight) hours as needed for nausea or vomiting.     oxybutynin  (DITROPAN ) 5 MG tablet Take 1 tablet (5 mg total) by mouth every 8 (eight) hours as needed for bladder spasms. 30 tablet 1   pantoprazole  (PROTONIX ) 40 MG tablet TAKE 1 TABLET EVERY DAY 90 tablet 3   POTASSIUM CHLORIDE  PO Take 20 mEq by mouth daily as needed (Take with fursomide).     Semaglutide,0.25 or 0.5MG /DOS, 2 MG/1.5ML SOPN Inject 0.5 mLs into the skin once a week.     Tiotropium Bromide-Olodaterol (STIOLTO RESPIMAT ) 2.5-2.5 MCG/ACT AERS Inhale 2 puffs into the lungs daily after lunch. 4 g 11   XIIDRA 5 % SOLN Place 1 drop into both eyes 2 (two) times daily.     No current  facility-administered medications for this visit.    REVIEW OF SYSTEMS:   Constitutional: ( - ) fevers, ( - )  chills , ( - ) night sweats Eyes: ( - ) blurriness of vision, ( - ) double vision, ( - ) watery eyes Ears, nose, mouth, throat, and face: ( - ) mucositis, ( - ) sore throat Respiratory: ( - ) cough, ( - ) dyspnea, ( - ) wheezes Cardiovascular: ( - ) palpitation, ( - ) chest discomfort, ( - ) lower extremity swelling Gastrointestinal:  ( - ) nausea, ( - ) heartburn, ( - ) change in bowel habits Skin: ( - ) abnormal skin rashes Lymphatics: ( - ) new lymphadenopathy, ( - ) easy bruising Neurological: ( - ) numbness, ( - ) tingling, ( - ) new weaknesses Behavioral/Psych: ( - ) mood change, ( - ) new changes  All other systems were reviewed with the patient and are negative.  PHYSICAL EXAMINATION:  Vitals:   07/04/24 1425  BP: (!) 165/68  Pulse: 72  Resp: 18  Temp: 97.9 F (36.6 C)  SpO2: 100%    Filed Weights   07/04/24 1425  Weight: 273 lb 14.4 oz (124.2 kg)     GENERAL: Well-appearing elderly Caucasian female, alert, no distress and comfortable SKIN: skin color, texture, turgor are normal, no rashes or significant lesions EYES: conjunctiva are pink and non-injected, sclera clear LUNGS:  clear to auscultation and percussion with normal breathing effort HEART: regular rate & rhythm and no murmurs and no lower extremity edema Musculoskeletal: no cyanosis of digits and no clubbing  PSYCH: alert & oriented x 3, fluent speech NEURO: no focal motor/sensory deficits  LABORATORY DATA:  I have reviewed the data as listed    Latest Ref Rng & Units 07/04/2024    1:55 PM 04/06/2024    1:51 PM 02/29/2024    2:50 PM  CBC  WBC 4.0 - 10.5 K/uL 8.0  7.3  9.6   Hemoglobin 12.0 - 15.0 g/dL 87.0  86.9  86.4   Hematocrit 36.0 - 46.0 % 38.8  38.8  42.3   Platelets 150 - 400 K/uL 235  239  268        Latest Ref Rng & Units 07/04/2024    1:55 PM 04/26/2024   12:00 AM 04/06/2024     1:51 PM  CMP  Glucose 70 - 99 mg/dL 878   885   BUN 8 - 23 mg/dL 24  37     37   Creatinine 0.60 - 1.20 mg/dL 8.55  1.8     8.15   Sodium 135 - 145 mmol/L 138  138     139   Potassium 3.5 - 5.1 mmol/L 3.8  4.2     3.6   Chloride 98 - 111 mmol/L 105  99     104   CO2 22 - 32 mmol/L 26  22     26    Calcium  8.9 - 10.3 mg/dL 8.9  9.8     9.1   Total Protein 6.5 - 8.1 g/dL 6.6   6.6   Total Bilirubin 0.2 - 1.6 mg/dL 0.9   0.8   Alkaline Phos 38 - 126 U/L 84   74   AST 11 - 38 U/L 11   10   ALT 10 - 47 U/L 12   12      This result is from an external source.    RADIOGRAPHIC STUDIES: No results found.   ASSESSMENT & PLAN Maria Marquez 78 y.o. 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 Devere  --Salvage therapy options including Pembrolizumab will be deferred for if she developed relapsed disease  --continue to be on active surveillance and repeat cystoscopy  --Labs today show white blood cell count 8.0, hemoglobin 12.9, MCV 89.2, platelets 235 --RTC in 6 months or sooner if signs/symptoms of recurrent disease.   #Elevated Cr # AKI -- Creatinine elevated to 1.44 today -- next week will see her nephrologist Dr. Macel  # T1aN0 adenocarcinoma of the lung  -- recommend CT chest q 6 months x 5 years followed by annually thereafter.  -- Continue every 6 month follow up.  Next scan due in April 2026.   No orders of the defined types were placed in this encounter.   All questions were answered. The patient knows to call the clinic with any problems, questions or concerns.  A total of more than 30 minutes were spent on this encounter with face-to-face time and  non-face-to-face time, including preparing to see the patient, ordering tests and/or medications, counseling the patient and coordination of care  as outlined above.   Norleen IVAR Kidney, MD Department of Hematology/Oncology [facility] at [facility] Phone: [phone] Pager: [phone] Email: [email or web address]  07/04/2024 4:38 PM

## 2024-07-10 ENCOUNTER — Other Ambulatory Visit: Payer: Self-pay | Admitting: Internal Medicine

## 2024-07-10 DIAGNOSIS — Z79890 Hormone replacement therapy (postmenopausal): Secondary | ICD-10-CM

## 2024-07-11 ENCOUNTER — Other Ambulatory Visit: Payer: Self-pay | Admitting: Urology

## 2024-07-23 ENCOUNTER — Ambulatory Visit: Admitting: Internal Medicine

## 2024-07-23 ENCOUNTER — Encounter: Payer: Self-pay | Admitting: Internal Medicine

## 2024-07-23 VITALS — BP 128/80 | HR 67 | Temp 97.7°F | Wt 272.0 lb

## 2024-07-23 DIAGNOSIS — B372 Candidiasis of skin and nail: Secondary | ICD-10-CM | POA: Diagnosis not present

## 2024-07-23 NOTE — Progress Notes (Signed)
 Established Patient Office Visit     CC/Reason for Visit: Rash under left armpit  HPI: Maria Marquez is a 78 y.o. female who is coming in today for the above mentioned reasons.  Noticed that a few days ago after a shower.  It is not itchy or painful.  It is a red ring under her left armpit.   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 macel;  due to chemotherapy   Chronic rhinitis    w/ upper airway wheezing due to nasal drainage per pulmonogy note (dr meade),  using stiolto inhaler   Complication of anesthesia    affects memory for a few days   Diabetes mellitus type 2, diet-controlled (HCC)    followed by pcp;    (04-20-2023 pt has started take farxiga  daily;    per pt check blood sugar multiple times daily w/ Libre,  fasting average 120--125   Dyspnea on exertion    04-20-2023 pt stated sob w/ long distancewalk and stairs but recovers quickly when stop/ rest,  ok with household chores and short walks   Family history of breast cancer 05/04/2021   Family history of ovarian cancer 05/04/2021   Generalized weakness    GERD (gastroesophageal reflux disease)    History of basal cell carcinoma (BCC) excision    per pt in 1970s on nose   History of chemotherapy    06-13-2020  to 12/ 2021  for right renal pelvis carcinoma   History of colon polyps    History of radiation therapy    11-13-2020 to 11-26-2020---   Right Lung- SBRT- Dr. Lynwood Nasuti   HLD (hyperlipidemia)    Hypertension    followed by pcp   Hypothyroidism, postsurgical 1979    followed by pcp   Leukocytosis    Nocturia    OA (osteoarthritis)    Pneumonia    PONV (postoperative nausea and vomiting)    Since chemo, ponv,   Port-A-Cath in place    Primary adenocarcinoma of upper lobe of right lung [facility]) 09/2020   oncologist--- dr dorsey/  pulmonology-- dr n. meade;  dx 02/ 2022;  s/p  SBRT 11-13-2020 to 11-26-2020   Urgency of urination    wears pads   Varicose veins of both legs with edema    Wears partial dentures    lower    Past Surgical History:  Procedure Laterality Date   COLONOSCOPY  last one 2017   CYSTOSCOPY W/ URETERAL STENT PLACEMENT Right 01/27/2022   Procedure: CYSTOSCOPY WITH RETROGRADE PYELOGRAM/ URETEROSCOPY/POSSIBLE BIOPSY/ POSSIBLE  URETERAL STENT PLACEMENT;  Surgeon: Devere Lonni Righter, MD;  Location: [facility];  Service: Urology;  Laterality: Right;   CYSTOSCOPY WITH BIOPSY Right 09/16/2021   Procedure: CYSTOSCOPY / URETEROSCOPY WITH BIOPSY/bugbee fulgeration/right retrograde;  Surgeon: Devere Lonni Righter, MD;  Location: [facility];  Service: Urology;  Laterality: Right;   CYSTOSCOPY WITH BIOPSY N/A 11/02/2023   Procedure: CYSTOSCOPY WITH BIOPSY;  Surgeon: Devere Lonni Righter, MD;  Location:  [facility];  Service: Urology;  Laterality: N/A;   CYSTOSCOPY WITH BIOPSY Right 03/07/2024   Procedure: CYSTOSCOPY, WITH BIOPSY;  Surgeon: Devere Lonni Righter, MD;  Location: [facility];  Service: Urology;  Laterality: Right;   CYSTOSCOPY WITH RETROGRADE PYELOGRAM, URETEROSCOPY AND STENT PLACEMENT Right 04/27/2023   Procedure: CYSTOSCOPY, WITH RIGHT RETROGRADE PYELOGRAM, RIGHT URETEROSCOPY, RIGHT URETERAL STENT PLACEMENT, FULGURATION OF RIGHT RENAL PELVIS TUMORS;  Surgeon: Devere Lonni Righter, MD;  Location[facility];  Service: Urology;  Laterality: Right;   CYSTOSCOPY WITH STENT PLACEMENT Right 10/01/2022   Procedure: CYSTOSCOPY WITH STENT PLACEMENT;  Surgeon: Devere Lonni Righter,  MD;  Location: [facility];  Service: Urology;  Laterality: Right;   CYSTOSCOPY WITH URETEROSCOPY Right 04/18/2020   Procedure: CYSTOSCOPY WITH URETEROSCOPY, BIOPSY;  Surgeon: Devere Lonni Righter, MD;  Location: [facility];  Service: Urology;  Laterality: Right;  ONLY NEEDS 45 MIN   CYSTOSCOPY WITH URETEROSCOPY Right 06/02/2022   Procedure: CYSTOSCOPY WITH URETEROSCOPY;  Surgeon: Devere Lonni Righter, MD;  Location: [facility];  Service: Urology;  Laterality: Right;  ONLY NEEDS 60 MINS   CYSTOSCOPY WITH URETEROSCOPY AND STENT PLACEMENT N/A 12/31/2020   Procedure: CYSTOSCOPY WITH RIGHT URETEROSCOPY/ BILATERAL RETROGRADE PYELOGRAM/RIGHT URETERAL BIOPSY/ LASER ABLATION/RIGHT STENT PLACEMENT;  Surgeon: Devere Lonni Righter, MD;  Location: [facility];  Service: Urology;  Laterality: N/A;   CYSTOSCOPY/RETROGRADE/URETEROSCOPY Right 04/22/2021   Procedure: CYSTOSCOPY/RETROGRADE/URETEROSCOPY/ STENT PLACEMENT;  Surgeon: Devere Lonni Righter, MD;  Location: [facility];  Service: Urology;  Laterality: Right;   CYSTOSCOPY/URETEROSCOPY/HOLMIUM LASER/STENT PLACEMENT Right 11/02/2023   Procedure: CYSTOSCOPY/RIGHT URETEROSCOPY/RETROGRADE PYELOGRAM/ LASER ABLATION/;  Surgeon: Devere Lonni Righter, MD;  Location: [facility];  Service: Urology;  Laterality: Right;  45 MINUTES NEEDED   CYSTOSCOPY/URETEROSCOPY/HOLMIUM LASER/STENT PLACEMENT Right 03/07/2024   Procedure: CYSTOSCOPY/URETEROSCOPY/HOLMIUM LASER TUMOR ABALTION/STENT PLACEMENT/TURBT;  Surgeon: Devere Lonni Righter, MD;  Location: [facility];  Service: Urology;  Laterality: Right;  CYSTOSCOPY WITH RIGHT RETROGRADE PYELOGRAM, URETEROSCOPY, POSSIBLE BIOPSY WITH LASER FULGURATION, POSSIBLE STENT PLACEMENT   EYE SURGERY Bilateral 1987   tear ducts   IR IMAGING GUIDED PORT INSERTION  06/04/2020   IR NEPHROSTOMY PLACEMENT RIGHT  03/03/2022   THYROID  LOBECTOMY Right 1979   TOTAL HIP  ARTHROPLASTY Right 01/28/2016   Procedure: RIGHT TOTAL HIP ARTHROPLASTY ANTERIOR APPROACH;  Surgeon: Dempsey Moan, MD;  Location: [facility];  Service: Orthopedics;  Laterality: Right;   UMBILICAL HERNIA REPAIR  1980s   URETEROSCOPY Right 10/01/2022   Procedure: URETEROSCOPY WITH BIOPSY pylegram;  Surgeon: Devere Lonni Righter, MD;  Location: [facility];  Service: Urology;  Laterality: Right;  45 MINS   VIDEO BRONCHOSCOPY WITH ENDOBRONCHIAL NAVIGATION N/A 06/06/2020   Procedure: VIDEO BRONCHOSCOPY WITH ENDOBRONCHIAL NAVIGATION;  Surgeon: Shelah Lamar RAMAN, MD;  Location: [facility];  Service: Thoracic;  Laterality: N/A;   VIDEO BRONCHOSCOPY WITH ENDOBRONCHIAL NAVIGATION N/A 10/10/2020   Procedure: VIDEO BRONCHOSCOPY WITH ENDOBRONCHIAL NAVIGATION;  Surgeon: Kerrin Elspeth BROCKS, MD;  Location: [facility];  Service: Thoracic;  Laterality: N/A;    Social History:  reports that she quit smoking about 4 years ago. Her smoking use included cigarettes. She started smoking about 49 years ago. She has a 45 pack-year smoking history. She has never used smokeless tobacco. She reports that she does not currently use alcohol. She reports that she does not use drugs.  Allergies: Allergies  Allergen Reactions   Iodine Shortness Of Breath and Other (See Comments)   Keflex  [Cephalexin ] Shortness Of Breath, Rash and Tinitus  Shellfish Allergy Hives   Shellfish Protein-Containing Drug Products Anaphylaxis and Hives   Hydrocodone  Hives and Itching    Itching throat   Rosuvastatin  Other (See Comments)    Muscle Pain in Thighs   Ciprofloxacin  Other (See Comments)   Latex Rash   Lisinopril Cough   Sulfa Antibiotics Nausea Only   Tramadol  Other (See Comments)    hallucinations    Family History:  Family History  Problem Relation Age of Onset   Arthritis Mother    Breast cancer Mother 12   Schizophrenia Mother    Diabetes Father    Heart disease Father    Kidney disease Father    CAD Father     Stroke Sister    Lung cancer Brother        dx > 50; work-related exposures   Heart disease Brother    Heart disease Brother    Vision loss Brother        Glaucoma   Alcohol abuse Brother    Ovarian cancer Daughter 67   Lung cancer Maternal Aunt        dx > 50   Colon cancer Paternal Aunt        dx > 50   Head & neck cancer Paternal Uncle        dx 69s   Leukemia Cousin        d. 10s   Breast cancer Niece 46     Current Outpatient Medications:    acetaminophen  (TYLENOL ) 500 MG tablet, Take 1,500 mg by mouth every 6 (six) hours as needed for moderate pain (pain score 4-6)., Disp: , Rfl:    albuterol  (PROVENTIL ) (2.5 MG/3ML) 0.083% nebulizer solution, Take 3 mLs (2.5 mg total) by nebulization every 6 (six) hours as needed for wheezing or shortness of breath., Disp: 75 mL, Rfl: 12   albuterol  (VENTOLIN  HFA) 108 (90 Base) MCG/ACT inhaler, INHALE ONE OR TWO PUFFS BY MOUTH INTO LUNGS EVERY SIX HOURS AS NEEDED FOR WHEEZING/SHORTNESS OF BREATH, Disp: 8.5 g, Rfl: 2   atorvastatin  (LIPITOR) 20 MG tablet, TAKE 1 TABLET EVERY DAY (NEED APPOINTMENT), Disp: 90 tablet, Rfl: 3   azelastine  (ASTELIN ) 0.1 % nasal spray, Place 1 spray into both nostrils 2 (two) times daily. Use in each nostril as directed, Disp: 30 mL, Rfl: 11   Azelastine -Fluticasone  137-50 MCG/ACT SUSP, Place 1 spray into the nose in the morning and at bedtime., Disp: 23 g, Rfl: 11   Blood Glucose Monitoring Suppl DEVI, 1 each by Does not apply route in the morning, at noon, and at bedtime. May substitute to any manufacturer covered by patient's insurance., Disp: 1 each, Rfl: 0   Continuous Blood Gluc Receiver (FREESTYLE LIBRE 3 READER) DEVI, 1 each by Does not apply route daily., Disp: 1 each, Rfl: 3   Continuous Glucose Sensor (FREESTYLE LIBRE 3 PLUS SENSOR) MISC, PLACE ONE SENSOR ONTO THE SKIN ONCE EVERY 15 DAYS; REMOVE OLD SENSOR; CHECK GLUCOSE CONTINUOUSLY, Disp: 2 each, Rfl: 11   dapagliflozin  propanediol (FARXIGA ) 10 MG TABS  tablet, Take 10 mg by mouth daily., Disp: , Rfl:    EPINEPHRINE  0.3 mg/0.3 mL IJ SOAJ injection, INJECT 0.3MG  INTO THE MUSCLE AS NEEDED FOR ANAPHYLAXIS, Disp: 2 each, Rfl: 0   estradiol -norethindrone  (ACTIVELLA) 1-0.5 MG tablet, TAKE ONE TABLET BY MOUTH ONCE A DAY, Disp: 28 tablet, Rfl: 3   ezetimibe  (ZETIA ) 10 MG tablet, TAKE 1 TABLET EVERY DAY (NEED MD APPOINTMENT FOR REFILLS), Disp: 90 tablet, Rfl: 3   fluticasone  (  FLONASE ) 50 MCG/ACT nasal spray, Place 1 spray into both nostrils in the morning and at bedtime., Disp: 16 g, Rfl: 11   furosemide  (LASIX ) 40 MG tablet, Take 40 mg by mouth 2 (two) times daily as needed for fluid or edema. (Patient taking differently: Take 20 mg by mouth daily as needed for fluid or edema.), Disp: , Rfl:    glucose 4 GM chewable tablet, Chew 1 tablet (4 g total) by mouth as needed for low blood sugar., Disp: 30 tablet, Rfl: 2   levothyroxine  (SYNTHROID ) 150 MCG tablet, Take 1 tablet (150 mcg total) by mouth daily before breakfast., Disp: 90 tablet, Rfl: 1   lidocaine -prilocaine  (EMLA ) cream, Apply 1 Application topically as needed., Disp: 30 g, Rfl: 0   losartan -hydrochlorothiazide  (HYZAAR) 100-25 MG tablet, Take 1 tablet by mouth daily. (Patient taking differently: Take 1 tablet by mouth daily. 50 mg daily with no hydrochlorothiazide .), Disp: , Rfl:    ondansetron  (ZOFRAN -ODT) 8 MG disintegrating tablet, Take 8 mg by mouth every 8 (eight) hours as needed for nausea or vomiting., Disp: , Rfl:    oxybutynin  (DITROPAN ) 5 MG tablet, Take 1 tablet (5 mg total) by mouth every 8 (eight) hours as needed for bladder spasms., Disp: 30 tablet, Rfl: 1   pantoprazole  (PROTONIX ) 40 MG tablet, TAKE 1 TABLET EVERY DAY, Disp: 90 tablet, Rfl: 3   Semaglutide,0.25 or 0.5MG /DOS, 2 MG/1.5ML SOPN, Inject 0.5 mLs into the skin once a week., Disp: , Rfl:    Tiotropium Bromide-Olodaterol (STIOLTO RESPIMAT ) 2.5-2.5 MCG/ACT AERS, Inhale 2 puffs into the lungs daily after lunch., Disp: 4 g, Rfl:  11   XIIDRA 5 % SOLN, Place 1 drop into both eyes 2 (two) times daily., Disp: , Rfl:    POTASSIUM CHLORIDE  PO, Take 20 mEq by mouth daily as needed (Take with fursomide). (Patient not taking: Reported on 07/23/2024), Disp: , Rfl:   Review of Systems:  Negative unless indicated in HPI.   Physical Exam: Vitals:   07/23/24 1057  BP: 128/80  Pulse: 67  Temp: 97.7 F (36.5 C)  TempSrc: Oral  SpO2: 97%  Weight: 272 lb (123.4 kg)    Body mass index is 43.9 kg/m.    Impression and Plan:  Yeast dermatitis  - Advised terbinafine cream twice daily to area affected.   Time spent:21 minutes reviewing chart, interviewing and examining patient and formulating plan of care.     Tully Theophilus Andrews, MD [facility]

## 2024-07-25 DIAGNOSIS — I129 Hypertensive chronic kidney disease with stage 1 through stage 4 chronic kidney disease, or unspecified chronic kidney disease: Secondary | ICD-10-CM | POA: Diagnosis not present

## 2024-07-25 DIAGNOSIS — R609 Edema, unspecified: Secondary | ICD-10-CM | POA: Diagnosis not present

## 2024-07-25 DIAGNOSIS — N1832 Chronic kidney disease, stage 3b: Secondary | ICD-10-CM | POA: Diagnosis not present

## 2024-07-25 DIAGNOSIS — E1122 Type 2 diabetes mellitus with diabetic chronic kidney disease: Secondary | ICD-10-CM | POA: Diagnosis not present

## 2024-07-25 DIAGNOSIS — E669 Obesity, unspecified: Secondary | ICD-10-CM | POA: Diagnosis not present

## 2024-08-04 NOTE — Progress Notes (Signed)
 COVID Vaccine received:  []  No [x]  Yes Date of any COVID positive Test in last 90 days: None   PCP - Estela Ethridge Andrews, MD Cardiologist - none Nephrology- Dr. GORMAN. [facility]- Glendia Elizabeth, MD Oncology- Norleen Kidney, MD Pulmonology- Verdon Gore, MD    Chest x-ray - 10-06-2021  2v,  Epic, CT Chest / A/ P 06-01-24 Epic EKG -  04-27-2023  Epic  repeat  Stress Test -  ECHO - 02-11-2023  Epic Cardiac Cath -  CT Coronary Calcium  score:     Pacemaker / ICD device [x]  No []  Yes   Spinal Cord Stimulator:[x]  No []  Yes       History of Sleep Apnea? [x]  No []  Yes   CPAP used?- [x]  No []  Yes    Medication on DOS: oxybutynin , pantoprazole , levothyroxine ,  XIIDRA eye drops, nasal sprays, Stiolto and Albuterol  Inhalers, and Tylenol   Hold DOS: Furosemide , losartan - HCTZ,   Patient has: []  NO Hx DM   []  Pre-DM   []  DM1  [x]   DM2 Does the patient monitor blood sugar?   []  N/A   []  No [x]  Yes  Last A1c was:   6.2     on   05-08-2024   Does patient have a [employer] or Dexcom? []  No [x]  Yes   Fasting Blood Sugar Ranges- 98-120 Checks Blood Sugar _continuous  times a day  Semaglutide  injection- hold 7-10 days.  Dapagliflozin  Farxiga  - hold x 72 hrs.   Blood Thinner / Instructions: none Aspirin Instructions:  none  Activity level: Able to walk up 2 flights of stairs without becoming significantly short of breath or having chest pain?  []  No   []    Yes  Patient can perform ADLs without assistance. []  No   []   Yes  Comments:   Anesthesia review:  DM2. HTN, CKD3b, anemia, PONV, current chemo tx- port-a-cath in right chest, (Urothelial Ca.renal pelvis , Lung Ca)    Patient denies any S&S of respiratory illness or Covid - no shortness of breath, fever, cough or chest pain at PAT appointment.  Patient verbalized understanding and agreement to the Pre-Surgical Instructions that were given to them at this PAT appointment. Patient was also educated of the need to review these PAT  instructions again prior to her surgery.I reviewed the appropriate phone numbers to call if they have any and questions or concerns.

## 2024-08-04 NOTE — Patient Instructions (Signed)
 SURGICAL WAITING ROOM VISITATION Patients having surgery or a procedure may have no more than 2 support people in the waiting area - these visitors may rotate in the visitor waiting room.   If the patient needs to stay at the hospital during part of their recovery, the visitor guidelines for inpatient rooms apply.  PRE-OP VISITATION  Pre-op nurse will coordinate an appropriate time for 1 support person to accompany the patient in pre-op.  This support person may not rotate.  This visitor will be contacted when the time is appropriate for the visitor to come back in the pre-op area.  To keep our patients, visitors and teammates safe and prevent the spread of respiratory illnesses over the next few months.  Temporary Visitor Restrictions  Children ages 32 and under will not be able to visit patients in [facility] under most circumstances. Visitation is not restricted outside of hospitals unless noted otherwise in the [facility] and Location Specific Visitation Guidelines at:       [email or web address]  Visitors with respiratory illnesses are discouraged from visiting and should remain at home. You are not required to quarantine at this time prior to your surgery. However, you must do this: Hand Hygiene often Do NOT share personal items Notify your provider if you are in close contact with someone who has COVID or you develop fever 100.4 or greater, new onset of sneezing, cough, sore throat, shortness of breath or body aches.  If you test positive for Covid or have been in contact with anyone that has tested positive in the last 10 days please notify you surgeon.    Your procedure is scheduled on:  Wednesday  August 29, 2024  Report to [facility] Main Entrance: [address] entrance where the [employer] is available.   Report to admitting at: 07:30    AM  Call this number if you have any questions or problems the morning of surgery [phone]  DO NOT EAT OR DRINK  ANYTHING AFTER MIDNIGHT THE NIGHT PRIOR TO YOUR SURGERY / PROCEDURE.   FOLLOW  ANY ADDITIONAL PRE OP INSTRUCTIONS YOU RECEIVED FROM YOUR SURGEON'S OFFICE!!!   Oral Hygiene is also important to reduce your risk of infection.        Remember - BRUSH YOUR TEETH THE MORNING OF SURGERY WITH YOUR REGULAR TOOTHPASTE  Do NOT smoke after Midnight the night before surgery.  Semaglutide  injection- hold 7-10 days. Last injection ?? Dapagliflozin  Farxiga  - Do not take 72 hours BEFORE surgery. Last dose will be taken on Saturday 08-25-24  STOP TAKING all Vitamins, Herbs and supplements 1 week before your surgery.   Take ONLY these medicines the morning of surgery with A SIP OF WATER : oxybutynin , pantoprazole , levothyroxine , XIIDRA eye drops, nasal sprays, Stiolto Inhalers, and Tylenol  if needed.   You may use your Albuterol  inhaler if needed. Please bring your Inhaler with you on the day of your surgery.    DO NOT TAKE Furosemide , losartan - HCTZ, on the day of surgery.                   You may not have any metal on your body including hair pins, jewelry, and body piercing  Do not wear make-up, lotions, powders, perfumes, or deodorant  Do not wear nail polish including gel and S&S, artificial / acrylic nails, or any other type of covering on natural nails including finger and toenails. If you have artificial nails, gel coating, etc., that needs to be removed by a  nail salon, Please have this removed prior to surgery. Not doing so may mean that your surgery could be cancelled or delayed if the Surgeon or anesthesia staff feels like they are unable to monitor you safely.   Do not shave 48 hours prior to surgery to avoid nicks in your skin which may contribute to postoperative infections.   Contacts, Hearing Aids, dentures or bridgework may not be worn into surgery. DENTURES WILL BE REMOVED PRIOR TO SURGERY PLEASE DO NOT APPLY Poly grip OR ADHESIVES!!!  Patients discharged on the day of surgery will not  be allowed to drive home.  Someone NEEDS to stay with you for the first 24 hours after anesthesia.  Do not bring your home medications to the hospital. The Pharmacy will dispense medications listed on your medication list to you during your admission in the Hospital.  Please read over the following fact sheets you were given: IF YOU HAVE QUESTIONS ABOUT YOUR PRE-OP INSTRUCTIONS, PLEASE CALL [phone].   [place] - Preparing for Surgery      Before surgery, you can play an important role.  Because skin is not sterile, your skin needs to be as free of germs as possible.  You can reduce the number of germs on your skin by washing with CHG (chlorahexidine gluconate) soap before surgery.  CHG is an antiseptic cleaner which kills germs and bonds with the skin to continue killing germs even after washing. Please DO NOT use if you have an allergy to CHG or antibacterial soaps.  If your skin becomes reddened/irritated stop using the CHG and inform your nurse when you arrive at Short Stay. Do not shave (including legs and underarms) for at least 48 hours prior to the first CHG shower.  You may shave your face/neck.  Please follow these instructions carefully:  1.  Shower with CHG Soap the night before surgery ONLY (DO NOT USE THE CHG SOAP THE MORNING OF SURGERY).  2.  If you choose to wash your hair, wash your hair first as usual with your normal  shampoo.  3.  After you shampoo, rinse your hair and body thoroughly to remove the shampoo.                             4.  Use CHG as you would any other liquid soap.  You can apply chg directly to the skin and wash.  Gently with a scrungie or clean washcloth.  5.  Apply the CHG Soap to your body ONLY FROM THE NECK DOWN.   Do not use on face/ open                           Wound or open sores. Avoid contact with eyes, ears mouth and genitals (private parts).                       Wash face,  Genitals (private parts) with your normal soap.             6.   Wash thoroughly, paying special attention to the area where your  surgery  will be performed.  7.  Thoroughly rinse your body with warm water  from the neck down.  8.  DO NOT shower/wash with your normal soap after using and rinsing off the CHG Soap.  9.  Pat yourself dry with a clean towel.            10.  Wear clean pajamas.            11.  Place clean sheets on your bed the night of your first shower and do not  sleep with pets.  Day of Surgery : Do not apply any CHG, lotions/deodorants the morning of surgery.  Please wear clean clothes to the hospital/surgery center.   FAILURE TO FOLLOW THESE INSTRUCTIONS MAY RESULT IN THE CANCELLATION OF YOUR SURGERY  PATIENT SIGNATURE_________________________________  NURSE SIGNATURE__________________________________  ________________________________________________________________________

## 2024-08-06 ENCOUNTER — Encounter (HOSPITAL_COMMUNITY): Payer: Self-pay

## 2024-08-06 ENCOUNTER — Encounter (HOSPITAL_COMMUNITY)
Admission: RE | Admit: 2024-08-06 | Discharge: 2024-08-06 | Disposition: A | Source: Ambulatory Visit | Attending: Urology

## 2024-08-06 ENCOUNTER — Other Ambulatory Visit: Payer: Self-pay

## 2024-08-06 VITALS — BP 160/68 | Temp 97.9°F | Resp 22 | Ht 66.0 in | Wt 271.2 lb

## 2024-08-06 DIAGNOSIS — Z01818 Encounter for other preprocedural examination: Secondary | ICD-10-CM

## 2024-08-06 DIAGNOSIS — Z79899 Other long term (current) drug therapy: Secondary | ICD-10-CM

## 2024-08-06 DIAGNOSIS — E1142 Type 2 diabetes mellitus with diabetic polyneuropathy: Secondary | ICD-10-CM

## 2024-08-06 DIAGNOSIS — C659 Malignant neoplasm of unspecified renal pelvis: Secondary | ICD-10-CM | POA: Diagnosis not present

## 2024-08-06 DIAGNOSIS — I10 Essential (primary) hypertension: Secondary | ICD-10-CM | POA: Diagnosis not present

## 2024-08-06 DIAGNOSIS — R9431 Abnormal electrocardiogram [ECG] [EKG]: Secondary | ICD-10-CM | POA: Diagnosis not present

## 2024-08-06 LAB — CBC
HCT: 41.9 % (ref 36.0–46.0)
Hemoglobin: 13.2 g/dL (ref 12.0–15.0)
MCH: 28.8 pg (ref 26.0–34.0)
MCHC: 31.5 g/dL (ref 30.0–36.0)
MCV: 91.5 fL (ref 80.0–100.0)
Platelets: 231 K/uL (ref 150–400)
RBC: 4.58 MIL/uL (ref 3.87–5.11)
RDW: 13.9 % (ref 11.5–15.5)
WBC: 9.2 K/uL (ref 4.0–10.5)
nRBC: 0 % (ref 0.0–0.2)

## 2024-08-06 LAB — COMPREHENSIVE METABOLIC PANEL WITH GFR
ALT: 13 U/L (ref 0–44)
AST: 16 U/L (ref 15–41)
Albumin: 3.9 g/dL (ref 3.5–5.0)
Alkaline Phosphatase: 104 U/L (ref 38–126)
Anion gap: 9 (ref 5–15)
BUN: 26 mg/dL — ABNORMAL HIGH (ref 8–23)
CO2: 26 mmol/L (ref 22–32)
Calcium: 9.2 mg/dL (ref 8.9–10.3)
Chloride: 103 mmol/L (ref 98–111)
Creatinine, Ser: 1.37 mg/dL — ABNORMAL HIGH (ref 0.44–1.00)
GFR, Estimated: 40 mL/min — ABNORMAL LOW (ref >60)
Glucose, Bld: 112 mg/dL — ABNORMAL HIGH (ref 70–99)
Potassium: 4.2 mmol/L (ref 3.5–5.1)
Sodium: 138 mmol/L (ref 135–145)
Total Bilirubin: 0.6 mg/dL (ref 0.0–1.2)
Total Protein: 6.9 g/dL (ref 6.5–8.1)

## 2024-08-06 LAB — HEMOGLOBIN A1C
Hgb A1c MFr Bld: 5.6 % (ref 4.8–5.6)
Mean Plasma Glucose: 114.02 mg/dL

## 2024-08-06 LAB — GLUCOSE, CAPILLARY: Glucose-Capillary: 105 mg/dL — ABNORMAL HIGH (ref 70–99)

## 2024-08-07 ENCOUNTER — Ambulatory Visit: Admitting: Internal Medicine

## 2024-08-07 ENCOUNTER — Encounter: Payer: Self-pay | Admitting: Internal Medicine

## 2024-08-07 VITALS — BP 128/78 | HR 70 | Temp 97.7°F | Wt 271.3 lb

## 2024-08-07 DIAGNOSIS — E1142 Type 2 diabetes mellitus with diabetic polyneuropathy: Secondary | ICD-10-CM | POA: Diagnosis not present

## 2024-08-07 DIAGNOSIS — E782 Mixed hyperlipidemia: Secondary | ICD-10-CM

## 2024-08-07 DIAGNOSIS — I10 Essential (primary) hypertension: Secondary | ICD-10-CM | POA: Diagnosis not present

## 2024-08-07 DIAGNOSIS — N1832 Chronic kidney disease, stage 3b: Secondary | ICD-10-CM | POA: Diagnosis not present

## 2024-08-07 DIAGNOSIS — E1122 Type 2 diabetes mellitus with diabetic chronic kidney disease: Secondary | ICD-10-CM

## 2024-08-07 DIAGNOSIS — Z7985 Long-term (current) use of injectable non-insulin antidiabetic drugs: Secondary | ICD-10-CM | POA: Diagnosis not present

## 2024-08-07 MED ORDER — SEMAGLUTIDE (1 MG/DOSE) 4 MG/3ML ~~LOC~~ SOPN: 1.0000 mg | PEN_INJECTOR | SUBCUTANEOUS | 2 refills | Status: AC

## 2024-08-07 NOTE — Assessment & Plan Note (Signed)
-  Discussed healthy lifestyle, including increased physical activity and better food choices to promote weight loss. - Increasing Ozempic  dose today.

## 2024-08-07 NOTE — Assessment & Plan Note (Signed)
 At goal with an LDL of 63.

## 2024-08-07 NOTE — Assessment & Plan Note (Signed)
 Well-controlled on current.

## 2024-08-07 NOTE — Progress Notes (Signed)
 Established Patient Office Visit     CC/Reason for Visit: Follow-up chronic conditions  HPI: Maria Marquez is a 78 y.o. female who is coming in today for the above mentioned reasons. Past Medical History is significant for: Type 2 diabetes, hypertension, hyperlipidemia, hypothyroidism, morbid obesity, urothelial carcinoma.  Still feeling a little depressed after the passing of her daughter.  Is requesting an increase in Ozempic  dosage to help with weight loss.   Past Medical/Surgical History: Past Medical History:  Diagnosis Date   Anemia associated with chemotherapy    followed by dr federico   Carcinoma of renal pelvis, right [facility]) 03/2020   urologist-- dr winter/  oncologist-- dr federico;   dx 08/ 2021 ;  chemo 10/ 2021  to 12/ 2021 and complete laser tumor ablation;  recurrent 05/ 2023   completed 6 mitomyocin gel infusions via nephrostomy tube 08/ 2023   Chronic kidney disease, stage 3a (HCC) 12/31/2019   nephrologist--- dr s. macel;  due to chemotherapy   Chronic rhinitis    w/ upper airway wheezing due to nasal drainage per pulmonogy note (dr meade),  using stiolto inhaler   Complication of anesthesia    affects memory for a few days   Diabetes mellitus type 2, diet-controlled (HCC)    followed by pcp;    (04-20-2023 pt has started take farxiga  daily;    per pt check blood sugar multiple times daily w/ Libre,  fasting average 120--125   Dyspnea on exertion    04-20-2023 pt stated sob w/ long distancewalk and stairs but recovers quickly when stop/ rest,  ok with household chores and short walks   Family history of breast cancer 05/04/2021   Family history of ovarian cancer 05/04/2021   Generalized weakness    GERD (gastroesophageal reflux disease)    History of basal cell carcinoma (BCC) excision    per pt in 1970s on nose   History of chemotherapy    06-13-2020  to 12/ 2021  for right renal pelvis carcinoma   History of colon polyps    History of kidney stones     History of radiation therapy    11-13-2020 to 11-26-2020---   Right Lung- SBRT- Dr. Lynwood Nasuti   HLD (hyperlipidemia)    Hypertension    followed by pcp   Hypothyroidism, postsurgical 1979   followed by pcp   Leukocytosis    Nocturia    OA (osteoarthritis)    Pneumonia    PONV (postoperative nausea and vomiting)    Since chemo, ponv,   Port-A-Cath in place    Primary adenocarcinoma of upper lobe of right lung [facility]) 09/2020   oncologist--- dr dorsey/  pulmonology-- dr n. meade;  dx 02/ 2022;  s/p  SBRT 11-13-2020 to 11-26-2020   Urgency of urination    wears pads   Varicose veins of both legs with edema    Wears partial dentures    lower    Past Surgical History:  Procedure Laterality Date   COLONOSCOPY  last one 2017   CYSTOSCOPY W/ URETERAL STENT PLACEMENT Right 01/27/2022   Procedure: CYSTOSCOPY WITH RETROGRADE PYELOGRAM/ URETEROSCOPY/POSSIBLE BIOPSY/ POSSIBLE  URETERAL STENT PLACEMENT;  Surgeon: Devere Lonni Righter, MD;  Location: [facility];  Service: Urology;  Laterality: Right;   CYSTOSCOPY WITH BIOPSY Right 09/16/2021   Procedure: CYSTOSCOPY / URETEROSCOPY WITH BIOPSY/bugbee fulgeration/right retrograde;  Surgeon: Devere Lonni Righter, MD;  Location: [facility];  Service: Urology;  Laterality: Right;  CYSTOSCOPY WITH BIOPSY N/A 11/02/2023   Procedure: CYSTOSCOPY WITH BIOPSY;  Surgeon: Devere Lonni Righter, MD;  Location: [facility];  Service: Urology;  Laterality: N/A;   CYSTOSCOPY WITH BIOPSY Right 03/07/2024   Procedure: CYSTOSCOPY, WITH BIOPSY;  Surgeon: Devere Lonni Righter, MD;  Location: [facility];  Service: Urology;  Laterality: Right;   CYSTOSCOPY WITH RETROGRADE PYELOGRAM, URETEROSCOPY AND STENT PLACEMENT Right 04/27/2023   Procedure: CYSTOSCOPY, WITH RIGHT RETROGRADE PYELOGRAM, RIGHT URETEROSCOPY, RIGHT URETERAL STENT PLACEMENT, FULGURATION OF RIGHT RENAL PELVIS TUMORS;  Surgeon: Devere Lonni Righter, MD;  Location:  [facility];  Service: Urology;  Laterality: Right;   CYSTOSCOPY WITH STENT PLACEMENT Right 10/01/2022   Procedure: CYSTOSCOPY WITH STENT PLACEMENT;  Surgeon: Devere Lonni Righter, MD;  Location: [facility];  Service: Urology;  Laterality: Right;   CYSTOSCOPY WITH URETEROSCOPY Right 04/18/2020   Procedure: CYSTOSCOPY WITH URETEROSCOPY, BIOPSY;  Surgeon: Devere Lonni Righter, MD;  Location: [facility];  Service: Urology;  Laterality: Right;  ONLY NEEDS 45 MIN   CYSTOSCOPY WITH URETEROSCOPY Right 06/02/2022   Procedure: CYSTOSCOPY WITH URETEROSCOPY;  Surgeon: Devere Lonni Righter, MD;  Location: [facility];  Service: Urology;  Laterality: Right;  ONLY NEEDS 60 MINS   CYSTOSCOPY WITH URETEROSCOPY AND STENT PLACEMENT N/A 12/31/2020   Procedure: CYSTOSCOPY WITH RIGHT URETEROSCOPY/ BILATERAL RETROGRADE PYELOGRAM/RIGHT URETERAL BIOPSY/ LASER ABLATION/RIGHT STENT PLACEMENT;  Surgeon: Devere Lonni Righter, MD;  Location: [facility];  Service: Urology;  Laterality: N/A;   CYSTOSCOPY/RETROGRADE/URETEROSCOPY Right 04/22/2021   Procedure: CYSTOSCOPY/RETROGRADE/URETEROSCOPY/ STENT PLACEMENT;  Surgeon: Devere Lonni Righter, MD;  Location: [facility];  Service: Urology;  Laterality: Right;   CYSTOSCOPY/URETEROSCOPY/HOLMIUM LASER/STENT PLACEMENT Right 11/02/2023   Procedure: CYSTOSCOPY/RIGHT URETEROSCOPY/RETROGRADE PYELOGRAM/ LASER ABLATION/;  Surgeon: Devere Lonni Righter, MD;  Location: [facility];  Service: Urology;  Laterality: Right;  45 MINUTES NEEDED   CYSTOSCOPY/URETEROSCOPY/HOLMIUM LASER/STENT PLACEMENT Right 03/07/2024   Procedure: CYSTOSCOPY/URETEROSCOPY/HOLMIUM LASER TUMOR ABALTION/STENT PLACEMENT/TURBT;  Surgeon: Devere Lonni Righter, MD;  Location: [facility];  Service: Urology;  Laterality: Right;  CYSTOSCOPY WITH RIGHT RETROGRADE PYELOGRAM, URETEROSCOPY, POSSIBLE BIOPSY WITH LASER FULGURATION, POSSIBLE STENT  PLACEMENT   EYE SURGERY Bilateral 1987   tear ducts   IR IMAGING GUIDED PORT INSERTION  06/04/2020   IR NEPHROSTOMY PLACEMENT RIGHT  03/03/2022   THYROID  LOBECTOMY Right 1979   TOTAL HIP ARTHROPLASTY Right 01/28/2016   Procedure: RIGHT TOTAL HIP ARTHROPLASTY ANTERIOR APPROACH;  Surgeon: Dempsey Moan, MD;  Location: [facility];  Service: Orthopedics;  Laterality: Right;   UMBILICAL HERNIA REPAIR  1980s   URETEROSCOPY Right 10/01/2022   Procedure: URETEROSCOPY WITH BIOPSY pylegram;  Surgeon: Devere Lonni Righter, MD;  Location: [facility];  Service: Urology;  Laterality: Right;  45 MINS   VIDEO BRONCHOSCOPY WITH ENDOBRONCHIAL NAVIGATION N/A 06/06/2020   Procedure: VIDEO BRONCHOSCOPY WITH ENDOBRONCHIAL NAVIGATION;  Surgeon: Shelah Lamar RAMAN, MD;  Location: [facility];  Service: Thoracic;  Laterality: N/A;   VIDEO BRONCHOSCOPY WITH ENDOBRONCHIAL NAVIGATION N/A 10/10/2020   Procedure: VIDEO BRONCHOSCOPY WITH ENDOBRONCHIAL NAVIGATION;  Surgeon: Kerrin Elspeth BROCKS, MD;  Location: [facility];  Service: Thoracic;  Laterality: N/A;    Social History:  reports that she quit smoking about 4 years ago. Her smoking use included cigarettes. She started smoking about 49 years ago. She has a 45 pack-year smoking history. She has never used smokeless tobacco. She reports that she does not currently use alcohol. She reports that she does not use drugs.  Allergies: Allergies[1]  Family History:  Family History  Problem Relation Age of Onset   Arthritis Mother    Breast cancer Mother 49   Schizophrenia Mother    Diabetes Father    Heart disease Father    Kidney disease Father    CAD Father    Stroke Sister    Lung cancer Brother        dx > 50; work-related exposures   Heart disease Brother    Heart disease Brother    Vision loss Brother        Glaucoma   Alcohol abuse Brother    Ovarian cancer Daughter 85   Lung cancer Maternal Aunt        dx > 50   Colon cancer Paternal Aunt        dx >  50   Head & neck cancer Paternal Uncle        dx 35s   Leukemia Cousin        d. 54s   Breast cancer Niece 46    Current Medications[2]  Review of Systems:  Negative unless indicated in HPI.   Physical Exam: Vitals:   08/07/24 1423 08/07/24 1447  BP: 130/78 128/78  Pulse: 70   Temp: 97.7 F (36.5 C)   TempSrc: Oral   SpO2: 98%   Weight: 271 lb 4.8 oz (123.1 kg)     Body mass index is 43.79 kg/m.   Physical Exam   Impression and Plan:  Type 2 diabetes mellitus with diabetic polyneuropathy, without long-term current use of insulin  (HCC) Assessment & Plan: Well-controlled with an A1c of 5.6.  Increase Ozempic  to 1 mg per patient request to help with weight loss.  Orders: -     Semaglutide  (1 MG/DOSE); Inject 1 mg as directed once a week.  Dispense: 3 mL; Refill: 2  Mixed hyperlipidemia Assessment & Plan: At goal with an LDL of 63.   Essential hypertension Assessment & Plan: Well-controlled on current.   Morbid obesity (HCC) Assessment & Plan: -Discussed healthy lifestyle, including increased physical activity and better food choices to promote weight loss. - Increasing Ozempic  dose today.   Stage 3b chronic kidney disease (HCC)  -Last creatinine was 1.670 in November 2025.   Time spent:31 minutes reviewing chart, interviewing and examining patient and formulating plan of care.     Tully Theophilus Andrews, MD [facility]     [1]  Allergies Allergen Reactions   Iodine Shortness Of Breath and Other (See Comments)   Keflex  [Cephalexin ] Shortness Of Breath, Rash and Tinitus   Shellfish Allergy Hives   Shellfish Protein-Containing Drug Products Anaphylaxis and Hives   Hydrocodone  Hives and Itching    Itching throat   Rosuvastatin  Other (See Comments)    Muscle Pain in Thighs   Ciprofloxacin  Other (See Comments)   Latex Rash   Lisinopril Cough   Sulfa Antibiotics Nausea Only   Tramadol  Other (See Comments)     hallucinations  [2]  Current Outpatient Medications:    acetaminophen  (TYLENOL ) 500 MG tablet, Take 1,500 mg by mouth every 6 (six) hours as needed for moderate pain (pain score 4-6)., Disp: , Rfl:    albuterol  (PROVENTIL ) (2.5 MG/3ML) 0.083% nebulizer solution, Take 3 mLs (2.5 mg total) by nebulization every 6 (six) hours as needed for wheezing or shortness of breath., Disp: 75 mL, Rfl: 12   albuterol  (VENTOLIN  HFA) 108 (90 Base) MCG/ACT inhaler, INHALE ONE OR TWO PUFFS BY MOUTH INTO LUNGS EVERY SIX HOURS AS NEEDED FOR  WHEEZING/SHORTNESS OF BREATH, Disp: 8.5 g, Rfl: 2   atorvastatin  (LIPITOR) 20 MG tablet, TAKE 1 TABLET EVERY DAY (NEED APPOINTMENT), Disp: 90 tablet, Rfl: 3   azelastine  (ASTELIN ) 0.1 % nasal spray, Place 1 spray into both nostrils 2 (two) times daily. Use in each nostril as directed (Patient taking differently: Place 1 spray into both nostrils 2 (two) times daily as needed for allergies. Use in each nostril as directed), Disp: 30 mL, Rfl: 11   Azelastine -Fluticasone  137-50 MCG/ACT SUSP, Place 1 spray into the nose in the morning and at bedtime., Disp: 23 g, Rfl: 11   Blood Glucose Monitoring Suppl DEVI, 1 each by Does not apply route in the morning, at noon, and at bedtime. May substitute to any manufacturer covered by patient's insurance., Disp: 1 each, Rfl: 0   Continuous Blood Gluc Receiver (FREESTYLE LIBRE 3 READER) DEVI, 1 each by Does not apply route daily., Disp: 1 each, Rfl: 3   Continuous Glucose Sensor (FREESTYLE LIBRE 3 PLUS SENSOR) MISC, PLACE ONE SENSOR ONTO THE SKIN ONCE EVERY 15 DAYS; REMOVE OLD SENSOR; CHECK GLUCOSE CONTINUOUSLY, Disp: 2 each, Rfl: 11   dapagliflozin  propanediol (FARXIGA ) 10 MG TABS tablet, Take 10 mg by mouth daily., Disp: , Rfl:    EPINEPHRINE  0.3 mg/0.3 mL IJ SOAJ injection, INJECT 0.3MG  INTO THE MUSCLE AS NEEDED FOR ANAPHYLAXIS, Disp: 2 each, Rfl: 0   estradiol -norethindrone  (ACTIVELLA) 1-0.5 MG tablet, TAKE ONE TABLET BY MOUTH ONCE A DAY (Patient  taking differently: Take 1 tablet by mouth every other day.), Disp: 28 tablet, Rfl: 3   ezetimibe  (ZETIA ) 10 MG tablet, TAKE 1 TABLET EVERY DAY (NEED MD APPOINTMENT FOR REFILLS), Disp: 90 tablet, Rfl: 3   fluticasone  (FLONASE ) 50 MCG/ACT nasal spray, Place 1 spray into both nostrils in the morning and at bedtime. (Patient taking differently: Place 1 spray into both nostrils 2 (two) times daily as needed for allergies.), Disp: 16 g, Rfl: 11   furosemide  (LASIX ) 20 MG tablet, Take 20 mg by mouth daily as needed for fluid or edema., Disp: , Rfl:    glucose 4 GM chewable tablet, Chew 1 tablet (4 g total) by mouth as needed for low blood sugar., Disp: 30 tablet, Rfl: 2   levothyroxine  (SYNTHROID ) 150 MCG tablet, Take 1 tablet (150 mcg total) by mouth daily before breakfast., Disp: 90 tablet, Rfl: 1   lidocaine -prilocaine  (EMLA ) cream, Apply 1 Application topically as needed. (Patient taking differently: Apply 1 Application topically daily as needed (prior to port flush).), Disp: 30 g, Rfl: 0   losartan -hydrochlorothiazide  (HYZAAR) 50-12.5 MG tablet, Take 1 tablet by mouth daily., Disp: , Rfl:    oxybutynin  (DITROPAN ) 5 MG tablet, Take 1 tablet (5 mg total) by mouth every 8 (eight) hours as needed for bladder spasms., Disp: 30 tablet, Rfl: 1   pantoprazole  (PROTONIX ) 40 MG tablet, TAKE 1 TABLET EVERY DAY (Patient taking differently: Take 40 mg by mouth every other day.), Disp: 90 tablet, Rfl: 3   Semaglutide , 1 MG/DOSE, 4 MG/3ML SOPN, Inject 1 mg as directed once a week., Disp: 3 mL, Rfl: 2   Tiotropium Bromide-Olodaterol (STIOLTO RESPIMAT ) 2.5-2.5 MCG/ACT AERS, Inhale 2 puffs into the lungs daily after lunch., Disp: 4 g, Rfl: 11   XIIDRA 5 % SOLN, Place 1 drop into both eyes 2 (two) times daily., Disp: , Rfl:

## 2024-08-07 NOTE — Assessment & Plan Note (Signed)
 Well-controlled with an A1c of 5.6.  Increase Ozempic  to 1 mg per patient request to help with weight loss.

## 2024-08-13 ENCOUNTER — Ambulatory Visit (INDEPENDENT_AMBULATORY_CARE_PROVIDER_SITE_OTHER): Admitting: Podiatry

## 2024-08-13 DIAGNOSIS — M79675 Pain in left toe(s): Secondary | ICD-10-CM

## 2024-08-13 DIAGNOSIS — E1151 Type 2 diabetes mellitus with diabetic peripheral angiopathy without gangrene: Secondary | ICD-10-CM

## 2024-08-13 DIAGNOSIS — L84 Corns and callosities: Secondary | ICD-10-CM

## 2024-08-13 DIAGNOSIS — B351 Tinea unguium: Secondary | ICD-10-CM | POA: Diagnosis not present

## 2024-08-13 DIAGNOSIS — M79674 Pain in right toe(s): Secondary | ICD-10-CM

## 2024-08-13 NOTE — Progress Notes (Signed)
 "     Subjective:  Patient ID: Maria Marquez, female    DOB: 1946-02-02,  MRN: [id number]  Maria Marquez presents to clinic today for:  Chief Complaint  Patient presents with   [facility]    [facility] with lateral callus.  A1c 5.6 in Dec No anti coag   Patient notes nails are thick and elongated, causing pain in shoe gear when ambulating.  She has a painful callus bilateral submet 5 that she would like shaved.  She notes she is starting to have pain in the left first MPJ, similar to the right first MPJ pain she was experiencing.  She notes that she will have throbbing in the big toe joints.  She has upcoming procedures at the beginning of 2026 and will need to push out any possible surgical interventions for the great toe joints until after that.  PCP is Theophilus Andrews, Tully GRADE, MD. last seen 08/07/2024  Past Medical History:  Diagnosis Date   Anemia associated with chemotherapy    followed by dr federico   Carcinoma of renal pelvis, right [facility]) 03/2020   urologist-- dr winter/  oncologist-- dr federico;   dx 08/ 2021 ;  chemo 10/ 2021  to 12/ 2021 and complete laser tumor ablation;  recurrent 05/ 2023   completed 6 mitomyocin gel infusions via nephrostomy tube 08/ 2023   Chronic kidney disease, stage 3a (HCC) 12/31/2019   nephrologist--- dr s. macel;  due to chemotherapy   Chronic rhinitis    w/ upper airway wheezing due to nasal drainage per pulmonogy note (dr meade),  using stiolto inhaler   Complication of anesthesia    affects memory for a few days   Diabetes mellitus type 2, diet-controlled (HCC)    followed by pcp;    (04-20-2023 pt has started take farxiga  daily;    per pt check blood sugar multiple times daily w/ Libre,  fasting average 120--125   Dyspnea on exertion    04-20-2023 pt stated sob w/ long distancewalk and stairs but recovers quickly when stop/ rest,  ok with household chores and short walks   Family history of breast cancer 05/04/2021   Family history of ovarian  cancer 05/04/2021   Generalized weakness    GERD (gastroesophageal reflux disease)    History of basal cell carcinoma (BCC) excision    per pt in 1970s on nose   History of chemotherapy    06-13-2020  to 12/ 2021  for right renal pelvis carcinoma   History of colon polyps    History of kidney stones    History of radiation therapy    11-13-2020 to 11-26-2020---   Right Lung- SBRT- Dr. Lynwood Nasuti   HLD (hyperlipidemia)    Hypertension    followed by pcp   Hypothyroidism, postsurgical 1979   followed by pcp   Leukocytosis    Nocturia    OA (osteoarthritis)    Pneumonia    PONV (postoperative nausea and vomiting)    Since chemo, ponv,   Port-A-Cath in place    Primary adenocarcinoma of upper lobe of right lung [facility]) 09/2020   oncologist--- dr dorsey/  pulmonology-- dr n. meade;  dx 02/ 2022;  s/p  SBRT 11-13-2020 to 11-26-2020   Urgency of urination    wears pads   Varicose veins of both legs with edema    Wears partial dentures    lower   Allergies  Allergen Reactions   Iodine Shortness Of Breath and Other (See  Comments)   Keflex  [Cephalexin ] Shortness Of Breath, Rash and Tinitus   Shellfish Allergy Hives   Shellfish Protein-Containing Drug Products Anaphylaxis and Hives   Hydrocodone  Hives and Itching    Itching throat   Rosuvastatin  Other (See Comments)    Muscle Pain in Thighs   Ciprofloxacin  Other (See Comments)   Latex Rash   Lisinopril Cough   Sulfa Antibiotics Nausea Only   Tramadol  Other (See Comments)    hallucinations    Objective:  Maria Marquez is a pleasant 79 y.o. female in NAD. AAO x 3.  Vascular Examination: Patient has palpable DP pulse, absent PT pulse bilateral.  Delayed capillary refill bilateral toes.  Sparse digital hair bilateral.  Proximal to distal cooling WNL bilateral.    Dermatological Examination: Interspaces are clear with no open lesions noted bilateral.  Skin is shiny and atrophic bilateral.  Nails are 3-12mm thick, with  yellowish/brown discoloration, subungual debris and distal onycholysis x10.  There is pain with compression of nails x10.  There are hyperkeratotic lesions noted bilateral submet 5.     Latest Ref Rng & Units 08/06/2024    1:52 PM 05/08/2024    1:34 PM 02/16/2024    1:36 PM 10/31/2023   10:45 AM  Hemoglobin A1C  Hemoglobin-A1c 4.8 - 5.6 % 5.6  6.2  6.7  7.2    Patient qualifies for at-risk foot care because of diabetes and mild PVD.  Assessment/Plan: 1. Pain due to onychomycosis of toenails of both feet   2. Pre-ulcerative calluses   3. Type II diabetes mellitus with peripheral circulatory disorder (HCC)     Mycotic nails x10 were sharply debrided with sterile nail nippers and power debriding burr to decrease bulk and length.  Hyperkeratotic lesion bilateral submet 5 was shaved with #312 blade.  Shall be scheduled for 2 appointments today.  1 will be for her diabetic footcare in 3 months and also within the next few weeks with Dr. Lamount for evaluation and x-ray of the left great toe joint.  Return in about 3 months (around 11/11/2024) for [facility].   Maria Marquez, DPM, FACFAS [facility]     [address] [place], [place] [address]                Office [phone]  Fax [phone] "

## 2024-08-13 NOTE — Progress Notes (Signed)
 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 She has routine surveillance cystoscopy/ureteroscopy due to recurrent UCC. Last was on 03/07/24. No complications noted.   Patient follows with pulmonology for COPD, hx of lung cancer, possible OSA.  Last seen on 06/26/24.  She reported worsening allergy symptoms. She is using inhalers.  She was sent rx for home nebulizer and allergy meds were changed. Advised f/u in 6 months.  Followed by Oncology for hx of cancer. Last seen on 07/04/24. Currently under surveillance. Last imaging did not show any evidence of disease.   Seen by PCP on 08/07/24 for routine f/u. Ozempic  dose increased. All other issues stable.  Hx of CKD and she follows with Nephrology. Last seen on 07/25/24. Hydrochlorothiazide  was restarted.    VS: BP (!) 160/68 Comment: left arm sitting, new changes in BP meds  Temp 36.6 C (Oral)   Resp (!) 22   Ht 5' 6 (1.676 m)   Wt 123 kg   SpO2 97%   BMI 43.77 kg/m   PROVIDERS: Theophilus Andrews, Tully GRADE, MD   LABS: Labs reviewed: Acceptable for surgery. (all labs ordered  are listed, but only abnormal results are displayed)  Labs Reviewed  COMPREHENSIVE METABOLIC PANEL WITH GFR - Abnormal; Notable for the following components:      Result Value   Glucose, Bld 112 (*)    BUN 26 (*)    Creatinine, Ser 1.37 (*)    GFR, Estimated 40 (*)    All other components within normal limits  GLUCOSE, CAPILLARY - Abnormal; Notable for the following components:   Glucose-Capillary 105 (*)    All other components within normal limits  HEMOGLOBIN A1C  CBC     CT chest/abdomen/pelvis 06/01/2024:   IMPRESSION: 1. Unchanged treated mass in the suprahilar right upper lobe. 2. Unchanged nodules in the posterior right upper lobe measuring up to 0.5 cm. 3. No noncontrast evidence of lymphadenopathy or metastatic disease in the abdomen, or pelvis. 4. Minimal emphysema and smoking-related respiratory bronchiolitis. 5. Cholelithiasis. 6. Umbilical hernia containing nonobstructed transverse colon. 7. Coronary artery disease.   Aortic Atherosclerosis (ICD10-I70.0) and Emphysema (ICD10-J43.9).   EKG 08/06/2024:  Normal sinus rhythm Low voltage QRS Cannot rule out anterior infarct, age undetermined   Echo 02/11/2023:  IMPRESSIONS    1. Left ventricular ejection fraction, by estimation, is 60 to 65%. The left ventricle has normal function. The left ventricle has no regional wall motion abnormalities. There is mild concentric left ventricular hypertrophy. Left ventricular diastolic parameters are consistent with Grade I diastolic dysfunction (impaired relaxation).  2. Right ventricular systolic function is normal. The right  ventricular size is normal. Tricuspid regurgitation signal is inadequate for assessing PA pressure.  3. The mitral valve is normal in structure. No evidence of mitral valve regurgitation. No evidence of mitral stenosis.  4. The aortic valve is tricuspid. Aortic valve regurgitation is not visualized.  5. The inferior vena cava is normal in  size with <50% respiratory variability, suggesting right atrial pressure of 8 mmHg.  Comparison(s): No prior Echocardiogram. Past Medical History:  Diagnosis Date   Anemia associated with chemotherapy    followed by dr federico   Carcinoma of renal pelvis, right [facility]) 03/2020   urologist-- dr winter/  oncologist-- dr federico;   dx 08/ 2021 ;  chemo 10/ 2021  to 12/ 2021 and complete laser tumor ablation;  recurrent 05/ 2023   completed 6 mitomyocin gel infusions via nephrostomy tube 08/ 2023   Chronic kidney disease, stage 3a (HCC) 12/31/2019   nephrologist--- dr s. macel;  due to chemotherapy   Chronic rhinitis    w/ upper airway wheezing due to nasal drainage per pulmonogy note (dr meade),  using stiolto inhaler   Complication of anesthesia    affects memory for a few days   Diabetes mellitus type 2, diet-controlled (HCC)    followed by pcp;    (04-20-2023 pt has started take farxiga  daily;    per pt check blood sugar multiple times daily w/ Libre,  fasting average 120--125   Dyspnea on exertion    04-20-2023 pt stated sob w/ long distancewalk and stairs but recovers quickly when stop/ rest,  ok with household chores and short walks   Family history of breast cancer 05/04/2021   Family history of ovarian cancer 05/04/2021   Generalized weakness    GERD (gastroesophageal reflux disease)    History of basal cell carcinoma (BCC) excision    per pt in 1970s on nose   History of chemotherapy    06-13-2020  to 12/ 2021  for right renal pelvis carcinoma   History of colon polyps    History of kidney stones    History of radiation therapy    11-13-2020 to 11-26-2020---   Right Lung- SBRT- Dr. Lynwood Nasuti   HLD (hyperlipidemia)    Hypertension    followed by pcp   Hypothyroidism, postsurgical 1979   followed by pcp   Leukocytosis    Nocturia    OA (osteoarthritis)    Pneumonia    PONV (postoperative nausea and vomiting)    Since chemo, ponv,   Port-A-Cath in place    Primary  adenocarcinoma of upper lobe of right lung [facility]) 09/2020   oncologist--- dr dorsey/  pulmonology-- dr n. meade;  dx 02/ 2022;  s/p  SBRT 11-13-2020 to 11-26-2020   Urgency of urination    wears pads   Varicose veins of both legs with edema    Wears partial dentures    lower    Past Surgical History:  Procedure Laterality Date   COLONOSCOPY  last one 2017   CYSTOSCOPY W/ URETERAL STENT PLACEMENT Right 01/27/2022   Procedure: CYSTOSCOPY WITH RETROGRADE PYELOGRAM/ URETEROSCOPY/POSSIBLE BIOPSY/ POSSIBLE  URETERAL STENT PLACEMENT;  Surgeon: Devere Lonni Righter, MD;  Location: [facility];  Service: Urology;  Laterality: Right;   CYSTOSCOPY WITH BIOPSY Right 09/16/2021   Procedure: CYSTOSCOPY / URETEROSCOPY WITH BIOPSY/bugbee fulgeration/right retrograde;  Surgeon: Devere Lonni Righter, MD;  Location: [facility];  Service: Urology;  Laterality: Right;   CYSTOSCOPY WITH BIOPSY N/A 11/02/2023   Procedure: CYSTOSCOPY  WITH BIOPSY;  Surgeon: Devere Lonni Righter, MD;  [facility];  Service: Urology;  Laterality: N/A;   CYSTOSCOPY WITH BIOPSY Right 03/07/2024   Procedure: CYSTOSCOPY, WITH BIOPSY;  Surgeon: Devere Lonni Righter, MD;  [facility];  Service: Urology;  Laterality: Right;   CYSTOSCOPY WITH RETROGRADE PYELOGRAM, URETEROSCOPY AND STENT PLACEMENT Right 04/27/2023   Procedure: CYSTOSCOPY, WITH RIGHT RETROGRADE PYELOGRAM, RIGHT URETEROSCOPY, RIGHT URETERAL STENT PLACEMENT, FULGURATION OF RIGHT RENAL PELVIS TUMORS;  Surgeon: Devere Lonni Righter, MD;  Location[facility];  Service: Urology;  Laterality: Right;   CYSTOSCOPY WITH STENT PLACEMENT Right 10/01/2022   Procedure: CYSTOSCOPY WITH STENT PLACEMENT;  Surgeon: Devere Lonni Righter, MD;  Location: [facility];  Service: Urology;  Laterality: Right;   CYSTOSCOPY WITH URETEROSCOPY Right 04/18/2020   Procedure: CYSTOSCOPY WITH URETEROSCOPY, BIOPSY;   Surgeon: Devere Lonni Righter, MD;  [facility];  Service: Urology;  Laterality: Right;  ONLY NEEDS 45 MIN   CYSTOSCOPY WITH URETEROSCOPY Right 06/02/2022   Procedure: CYSTOSCOPY WITH URETEROSCOPY;  Surgeon: Devere Lonni Righter, MD;  Location: [facility];  Service: Urology;  Laterality: Right;  ONLY NEEDS 60 MINS   CYSTOSCOPY WITH URETEROSCOPY AND STENT PLACEMENT N/A 12/31/2020   Procedure: CYSTOSCOPY WITH RIGHT URETEROSCOPY/ BILATERAL RETROGRADE PYELOGRAM/RIGHT URETERAL BIOPSY/ LASER ABLATION/RIGHT STENT PLACEMENT;  Surgeon: Devere Lonni Righter, MD;  Location: [facility];  Service: Urology;  Laterality: N/A;   CYSTOSCOPY/RETROGRADE/URETEROSCOPY Right 04/22/2021   Procedure: CYSTOSCOPY/RETROGRADE/URETEROSCOPY/ STENT PLACEMENT;  Surgeon: Devere Lonni Righter, MD;  Location: [facility];  Service: Urology;  Laterality: Right;   CYSTOSCOPY/URETEROSCOPY/HOLMIUM LASER/STENT PLACEMENT Right 11/02/2023   Procedure: CYSTOSCOPY/RIGHT URETEROSCOPY/RETROGRADE PYELOGRAM/ LASER ABLATION/;  Surgeon: Devere Lonni Righter, MD;  [facility];  Service: Urology;  Laterality: Right;  45 MINUTES NEEDED   CYSTOSCOPY/URETEROSCOPY/HOLMIUM LASER/STENT PLACEMENT Right 03/07/2024   Procedure: CYSTOSCOPY/URETEROSCOPY/HOLMIUM LASER TUMOR ABALTION/STENT PLACEMENT/TURBT;  Surgeon: Devere Lonni Righter, MD;  Location[facility];  Service: Urology;  Laterality: Right;  CYSTOSCOPY WITH RIGHT RETROGRADE PYELOGRAM, URETEROSCOPY, POSSIBLE BIOPSY WITH LASER FULGURATION, POSSIBLE STENT PLACEMENT   EYE SURGERY Bilateral 1987   tear ducts   IR IMAGING GUIDED PORT INSERTION  06/04/2020   IR NEPHROSTOMY PLACEMENT RIGHT  03/03/2022   THYROID  LOBECTOMY Right 1979   TOTAL HIP ARTHROPLASTY Right 01/28/2016   Procedure: RIGHT TOTAL HIP ARTHROPLASTY ANTERIOR APPROACH;  Surgeon: Dempsey Moan, MD;  Location: [facility];  Service: Orthopedics;  Laterality: Right;   UMBILICAL  HERNIA REPAIR  1980s   URETEROSCOPY Right 10/01/2022   Procedure: URETEROSCOPY WITH BIOPSY pylegram;  Surgeon: Devere Lonni Righter, MD;  Location: [facility];  Service: Urology;  Laterality: Right;  45 MINS   VIDEO BRONCHOSCOPY WITH ENDOBRONCHIAL NAVIGATION N/A 06/06/2020   Procedure: VIDEO BRONCHOSCOPY WITH ENDOBRONCHIAL NAVIGATION;  Surgeon: Shelah Lamar RAMAN, MD;  Location: [facility];  Service: Thoracic;  Laterality: N/A;   VIDEO BRONCHOSCOPY WITH ENDOBRONCHIAL NAVIGATION N/A 10/10/2020   Procedure: VIDEO BRONCHOSCOPY WITH ENDOBRONCHIAL NAVIGATION;  Surgeon: Kerrin Elspeth BROCKS, MD;  Location: [facility];  Service: Thoracic;  Laterality: N/A;    MEDICATIONS:  acetaminophen  (TYLENOL ) 500 MG tablet   albuterol  (PROVENTIL ) (2.5 MG/3ML) 0.083% nebulizer solution   albuterol  (VENTOLIN  HFA) 108 (90 Base) MCG/ACT inhaler   atorvastatin  (LIPITOR) 20 MG tablet   azelastine  (ASTELIN ) 0.1 % nasal spray   Azelastine -Fluticasone  137-50 MCG/ACT SUSP   Blood Glucose Monitoring Suppl DEVI   Continuous Blood Gluc Receiver (FREESTYLE LIBRE 3 READER) DEVI   Continuous Glucose Sensor (  FREESTYLE LIBRE 3 PLUS SENSOR) MISC   dapagliflozin  propanediol (FARXIGA ) 10 MG TABS tablet   EPINEPHRINE  0.3 mg/0.3 mL IJ SOAJ injection   estradiol -norethindrone  (ACTIVELLA) 1-0.5 MG tablet   ezetimibe  (ZETIA ) 10 MG tablet   fluticasone  (FLONASE ) 50 MCG/ACT nasal spray   furosemide  (LASIX ) 20 MG tablet   glucose 4 GM chewable tablet   levothyroxine  (SYNTHROID ) 150 MCG tablet   lidocaine -prilocaine  (EMLA ) cream   losartan -hydrochlorothiazide  (HYZAAR) 50-12.5 MG tablet   oxybutynin  (DITROPAN ) 5 MG tablet   pantoprazole  (PROTONIX ) 40 MG tablet   Semaglutide , 1 MG/DOSE, 4 MG/3ML SOPN   Tiotropium Bromide-Olodaterol (STIOLTO RESPIMAT ) 2.5-2.5 MCG/ACT AERS   XIIDRA 5 % SOLN   No current facility-administered medications for this encounter.   Burnard CHRISTELLA Odis DEVONNA [facility]/Anesthesiology [facility]  Phone [phone] 08/13/2024 11:10 AM        "

## 2024-08-13 NOTE — Anesthesia Preprocedure Evaluation (Addendum)
 "                                  Anesthesia Evaluation  Patient identified by MRN, date of birth, ID band Patient awake    Reviewed: Allergy & Precautions, NPO status , Patient's Chart, lab work & pertinent test results  History of Anesthesia Complications (+) PONV and history of anesthetic complications  Airway Mallampati: III  TM Distance: >3 FB Neck ROM: Full    Dental  (+) Edentulous Lower, Dental Advisory Given, Missing, Chipped, Poor Dentition, Implants   Pulmonary shortness of breath, former smoker   Pulmonary exam normal breath sounds clear to auscultation       Cardiovascular hypertension, Pt. on medications Normal cardiovascular exam Rhythm:Regular Rate:Normal  EKG 08/06/2024: Normal sinus rhythm Low voltage QRS   Echo 02/11/2023:  IMPRESSIONS  1. Left ventricular ejection fraction, by estimation, is 60 to 65%. The left ventricle has normal function. The left ventricle has no regional wall motion abnormalities. There is mild concentric left ventricular hypertrophy. Left ventricular diastolic parameters are consistent with Grade I diastolic dysfunction (impaired relaxation).  2. Right ventricular systolic function is normal. The right ventricular size is normal. Tricuspid regurgitation signal is inadequate for assessing PA pressure.  3. The mitral valve is normal in structure. No evidence of mitral valve regurgitation. No evidence of mitral stenosis.  4. The aortic valve is tricuspid. Aortic valve regurgitation is not visualized.  5. The inferior vena cava is normal in size with <50% respiratory variability, suggesting right atrial pressure of 8 mmHg.      Neuro/Psych  Headaches PSYCHIATRIC DISORDERS  Depression       GI/Hepatic Neg liver ROS,GERD  ,,  Endo/Other  diabetes, Type 2Hypothyroidism  Class 3 obesity (BMI 46)  Renal/GU Renal InsufficiencyRenal disease  negative genitourinary   Musculoskeletal  (+) Arthritis ,     Abdominal   Peds  Hematology negative hematology ROS (+)   Anesthesia Other Findings   Reproductive/Obstetrics                              Anesthesia Physical Anesthesia Plan  ASA: 3  Anesthesia Plan: General   Post-op Pain Management: Tylenol  PO (pre-op)*   Induction: Intravenous  PONV Risk Score and Plan: 4 or greater and Ondansetron , Dexamethasone  and Treatment may vary due to age or medical condition  Airway Management Planned: LMA  Additional Equipment: None  Intra-op Plan:   Post-operative Plan: Extubation in OR  Informed Consent: I have reviewed the patients History and Physical, chart, labs and discussed the procedure including the risks, benefits and alternatives for the proposed anesthesia with the patient or authorized representative who has indicated his/her understanding and acceptance.     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 She has routine surveillance cystoscopy/ureteroscopy due to recurrent UCC. Last was on 03/07/24. No complications noted.   Patient follows with pulmonology for COPD, hx of lung cancer, possible OSA.  Last  seen on 06/26/24.  She reported worsening allergy symptoms. She is using inhalers.  She was sent rx for home nebulizer and allergy meds were changed. Advised f/u in 6 months.   Followed by Oncology for hx of cancer. Last seen on 07/04/24. Currently under surveillance. Last imaging did not show any evidence of disease.   Seen by PCP on 08/07/24 for routine f/u. Ozempic  dose  increased. All other issues stable.   Hx of CKD and she follows with Nephrology. Last seen on 07/25/24. Hydrochlorothiazide  was restarted.   )         Anesthesia Quick Evaluation  "

## 2024-08-21 ENCOUNTER — Encounter: Payer: Self-pay | Admitting: Internal Medicine

## 2024-08-21 DIAGNOSIS — Z13820 Encounter for screening for osteoporosis: Secondary | ICD-10-CM

## 2024-08-28 ENCOUNTER — Inpatient Hospital Stay: Admission: RE | Admit: 2024-08-28 | Discharge: 2024-08-28

## 2024-08-28 DIAGNOSIS — Z13820 Encounter for screening for osteoporosis: Secondary | ICD-10-CM | POA: Diagnosis not present

## 2024-08-29 ENCOUNTER — Ambulatory Visit (HOSPITAL_COMMUNITY)

## 2024-08-29 ENCOUNTER — Encounter (HOSPITAL_COMMUNITY)
Admission: RE | Disposition: A | Discharge status: Home / Self Care | Payer: Self-pay | Source: Home / Self Care | Attending: Urology

## 2024-08-29 ENCOUNTER — Other Ambulatory Visit: Payer: Self-pay

## 2024-08-29 ENCOUNTER — Ambulatory Visit (HOSPITAL_COMMUNITY): Payer: Self-pay

## 2024-08-29 ENCOUNTER — Ambulatory Visit (HOSPITAL_COMMUNITY)
Admission: RE | Admit: 2024-08-29 | Discharge: 2024-08-29 | Disposition: A | Discharge status: Home / Self Care | Attending: Urology | Admitting: Urology

## 2024-08-29 ENCOUNTER — Ambulatory Visit (HOSPITAL_COMMUNITY): Payer: Self-pay | Admitting: Medical

## 2024-08-29 DIAGNOSIS — C661 Malignant neoplasm of right ureter: Secondary | ICD-10-CM | POA: Insufficient documentation

## 2024-08-29 DIAGNOSIS — D494 Neoplasm of unspecified behavior of bladder: Secondary | ICD-10-CM | POA: Diagnosis not present

## 2024-08-29 DIAGNOSIS — I10 Essential (primary) hypertension: Secondary | ICD-10-CM | POA: Diagnosis not present

## 2024-08-29 DIAGNOSIS — E1142 Type 2 diabetes mellitus with diabetic polyneuropathy: Secondary | ICD-10-CM

## 2024-08-29 DIAGNOSIS — C679 Malignant neoplasm of bladder, unspecified: Secondary | ICD-10-CM | POA: Diagnosis not present

## 2024-08-29 DIAGNOSIS — C651 Malignant neoplasm of right renal pelvis: Secondary | ICD-10-CM | POA: Diagnosis present

## 2024-08-29 DIAGNOSIS — Z87891 Personal history of nicotine dependence: Secondary | ICD-10-CM | POA: Insufficient documentation

## 2024-08-29 DIAGNOSIS — Z01818 Encounter for other preprocedural examination: Secondary | ICD-10-CM

## 2024-08-29 DIAGNOSIS — E119 Type 2 diabetes mellitus without complications: Secondary | ICD-10-CM | POA: Diagnosis not present

## 2024-08-29 HISTORY — PX: CYSTOSCOPY W/ URETERAL STENT PLACEMENT: SHX1429

## 2024-08-29 HISTORY — PX: CYSTOSCOPY WITH HOLMIUM LASER LITHOTRIPSY: SHX6639

## 2024-08-29 LAB — GLUCOSE, CAPILLARY
Glucose-Capillary: 124 mg/dL — ABNORMAL HIGH (ref 70–99)
Glucose-Capillary: 132 mg/dL — ABNORMAL HIGH (ref 70–99)

## 2024-08-29 MED ORDER — PHENYLEPHRINE 80 MCG/ML (10ML) SYRINGE FOR IV PUSH (FOR BLOOD PRESSURE SUPPORT)
PREFILLED_SYRINGE | INTRAVENOUS | Status: DC | PRN
  Administered 2024-08-29: 160 ug via INTRAVENOUS
  Administered 2024-08-29: 80 ug via INTRAVENOUS
  Administered 2024-08-29: 160 ug via INTRAVENOUS

## 2024-08-29 MED ORDER — IOHEXOL 300 MG/ML  SOLN
INTRAMUSCULAR | Status: DC | PRN
  Administered 2024-08-29: 8 mL via URETHRAL

## 2024-08-29 MED ORDER — FENTANYL CITRATE (PF) 100 MCG/2ML IJ SOLN
INTRAMUSCULAR | Status: AC
  Filled 2024-08-29: qty 2

## 2024-08-29 MED ORDER — OXYCODONE-ACETAMINOPHEN 5-325 MG PO TABS: 1.0000 | ORAL_TABLET | ORAL | 0 refills | Status: AC | PRN

## 2024-08-29 MED ORDER — ORAL CARE MOUTH RINSE: 15.0000 mL | Freq: Once | OROMUCOSAL | Status: AC

## 2024-08-29 MED ORDER — PROPOFOL 10 MG/ML IV BOLUS
INTRAVENOUS | Status: AC
  Filled 2024-08-29: qty 20

## 2024-08-29 MED ORDER — OXYCODONE HCL 5 MG/5ML PO SOLN: 5.0000 mg | Freq: Once | ORAL | Status: DC | PRN

## 2024-08-29 MED ORDER — OXYCODONE HCL 5 MG PO TABS: 5.0000 mg | ORAL_TABLET | Freq: Once | ORAL | Status: DC | PRN

## 2024-08-29 MED ORDER — INSULIN ASPART 100 UNIT/ML IJ SOLN: 0.0000 [IU] | INTRAMUSCULAR | Status: DC | PRN

## 2024-08-29 MED ORDER — ONDANSETRON HCL 4 MG/2ML IJ SOLN: 4.0000 mg | Freq: Once | INTRAMUSCULAR | Status: DC | PRN

## 2024-08-29 MED ORDER — CELECOXIB 200 MG PO CAPS
200.0000 mg | ORAL_CAPSULE | Freq: Once | ORAL | Status: AC
  Administered 2024-08-29: 200 mg via ORAL
  Filled 2024-08-29: qty 1

## 2024-08-29 MED ORDER — WATER FOR IRRIGATION, STERILE IR SOLN
Status: DC | PRN
  Administered 2024-08-29: 3000 mL via URETHRAL

## 2024-08-29 MED ORDER — CLINDAMYCIN PHOSPHATE 900 MG/50ML IV SOLN
900.0000 mg | INTRAVENOUS | Status: AC
  Administered 2024-08-29: 900 mg via INTRAVENOUS
  Filled 2024-08-29: qty 50

## 2024-08-29 MED ORDER — EPHEDRINE SULFATE-NACL 50-0.9 MG/10ML-% IV SOSY
PREFILLED_SYRINGE | INTRAVENOUS | Status: DC | PRN
  Administered 2024-08-29 (×2): 5 mg via INTRAVENOUS

## 2024-08-29 MED ORDER — PROPOFOL 10 MG/ML IV BOLUS
INTRAVENOUS | Status: DC | PRN
  Administered 2024-08-29 (×2): 200 mg via INTRAVENOUS

## 2024-08-29 MED ORDER — FENTANYL CITRATE (PF) 100 MCG/2ML IJ SOLN
INTRAMUSCULAR | Status: DC | PRN
  Administered 2024-08-29: 50 ug via INTRAVENOUS

## 2024-08-29 MED ORDER — LIDOCAINE HCL (PF) 2 % IJ SOLN
INTRAMUSCULAR | Status: DC | PRN
  Administered 2024-08-29: 100 mg via INTRADERMAL

## 2024-08-29 MED ORDER — ONDANSETRON HCL 4 MG/2ML IJ SOLN
INTRAMUSCULAR | Status: DC | PRN
  Administered 2024-08-29: 4 mg via INTRAVENOUS

## 2024-08-29 MED ORDER — PHENYLEPHRINE 80 MCG/ML (10ML) SYRINGE FOR IV PUSH (FOR BLOOD PRESSURE SUPPORT)
PREFILLED_SYRINGE | INTRAVENOUS | Status: AC
  Filled 2024-08-29: qty 10

## 2024-08-29 MED ORDER — EPHEDRINE 5 MG/ML INJ
INTRAVENOUS | Status: AC
  Filled 2024-08-29: qty 5

## 2024-08-29 MED ORDER — ACETAMINOPHEN 500 MG PO TABS: 1000.0000 mg | ORAL_TABLET | Freq: Once | ORAL | Status: DC

## 2024-08-29 MED ORDER — ACETAMINOPHEN 500 MG PO TABS
1000.0000 mg | ORAL_TABLET | Freq: Once | ORAL | Status: AC
  Administered 2024-08-29: 1000 mg via ORAL
  Filled 2024-08-29: qty 2

## 2024-08-29 MED ORDER — DEXAMETHASONE SOD PHOSPHATE PF 10 MG/ML IJ SOLN
INTRAMUSCULAR | Status: AC
  Filled 2024-08-29: qty 1

## 2024-08-29 MED ORDER — FENTANYL CITRATE (PF) 50 MCG/ML IJ SOSY: 25.0000 ug | PREFILLED_SYRINGE | INTRAMUSCULAR | Status: DC | PRN

## 2024-08-29 MED ORDER — CHLORHEXIDINE GLUCONATE 0.12 % MT SOLN
15.0000 mL | Freq: Once | OROMUCOSAL | Status: AC
  Administered 2024-08-29: 15 mL via OROMUCOSAL

## 2024-08-29 MED ORDER — SODIUM CHLORIDE 0.9 % IR SOLN
Status: DC | PRN
  Administered 2024-08-29: 3000 mL via INTRAVESICAL

## 2024-08-29 MED ORDER — ONDANSETRON HCL 4 MG/2ML IJ SOLN
INTRAMUSCULAR | Status: AC
  Filled 2024-08-29: qty 2

## 2024-08-29 MED ORDER — MEPERIDINE HCL 25 MG/ML IJ SOLN: 6.2500 mg | INTRAMUSCULAR | Status: DC | PRN

## 2024-08-29 MED ORDER — LIDOCAINE HCL (PF) 2 % IJ SOLN
INTRAMUSCULAR | Status: AC
  Filled 2024-08-29: qty 5

## 2024-08-29 MED ORDER — DEXAMETHASONE SOD PHOSPHATE PF 10 MG/ML IJ SOLN
INTRAMUSCULAR | Status: DC | PRN
  Administered 2024-08-29: 8 mg via INTRAVENOUS

## 2024-08-29 MED ORDER — LACTATED RINGERS IV SOLN: INTRAVENOUS | Status: DC

## 2024-08-29 NOTE — Op Note (Signed)
 Operative Note  Preoperative diagnosis:  1.  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  Recurrent low-grade TA urothelial carcinoma the bladder and right ureter  Procedure(s): 1.  Cystoscopy with bladder tumor fulguration 2.  Right ureteroscopy with right ureteral laser tumor ablation and right ureteral stent placement 3.  Right retrograde pyelogram with intraoperative interpretation fluoroscopic imaging  Surgeon: Lonni Han, MD  Assistants:  None  Anesthesia:  General  Complications:  None  EBL: Less than 5 mL  Specimens: 1.  None  Drains/Catheters: 1.  Right 6 French, 24 cm JJ stent without tether  Intraoperative findings:   There were a total of four, 1 to 2 mm papillary bladder tumors seen involving the posterior bladder wall, adjacent to her previous bladder tumor resection site (photographs taken). There were a total of two, 1 to 2 mm papillary bladder tumors seen within the midportion of the right ureter (photographs taken). Right retrograde pyelogram revealed a solitary right collecting system with no filling defects or dilation involving the right ureter or right renal pelvis seen on retrograde pyelogram  Indication:  Maria Marquez is a 79 y.o. female with a history of recurrent low-grade TA urothelial carcinoma involving the right ureter and bladder.  She is here today for surveillance cystoscopy ureteroscopy.  She has been consented for the above procedures, voices understanding and wishes to proceed.  Description of procedure:  After informed consent was obtained, the patient was brought to the operating room and general LMA anesthesia was administered. The patient was then placed in the dorsolithotomy position and prepped and draped in the usual sterile fashion. A timeout was performed. A 21 French rigid cystoscope was then inserted into the urethral meatus and advanced into the bladder under  direct vision. A complete bladder survey revealed a total of four, 1 to 2 mm papillary bladder tumors involving the posterior bladder wall and immediately adjacent to the previous bladder tumor resection site.  Otherwise, no other intravesical or urethral abnormalities were seen.  A 5 French ureteral catheter was then inserted into the right ureteral orifice and a retrograde pyelogram was obtained, with the findings listed above.  A Glidewire was then used to intubate the lumen of the ureteral catheter and was advanced up to the right renal pelvis, under fluoroscopic guidance.  The catheter was then removed, leaving the wire in place.  A flexible ureteroscope was then advanced up the right ureter to the renal pelvis.  A full inspection of the right renal pelvis revealed no mucosal abnormalities.  Retraction of the flexible ureteroscope into the midportion of the right ureter revealed 2 very small 1 to 2 mm papillary bladder tumors that were subsequently ablated with a 200 m holmium laser.  The flexible ureteroscope was then removed, identifying no other right ureteral mucosal abnormalities.  A 6 French, 24 cm JJ stent was then advanced over the Glidewire and into good position within the right collecting system, confirmed placement via fluoroscopy.  The rigid cystoscope was then reinserted into the bladder.  Bugbee electrocautery was then used to extensively fulgurate her previously identified small papillary bladder tumors.  There was no residual bleeding following fulguration.  The patient's bladder was drained.  She tolerated the procedure well and was transferred to the postanesthesia in stable condition.  Plan: Follow-up in 1 week for office cystoscopy and stent removal

## 2024-08-29 NOTE — Interval H&P Note (Signed)
 History and Physical Interval Note:  08/29/2024 8:57 AM  Maria Marquez  has presented today for surgery, with the diagnosis of RIGHT RENAL PELVIS CANCER.  The various methods of treatment have been discussed with the patient and family. After consideration of risks, benefits and other options for treatment, the patient has consented to  Procedures with comments: CYSTOSCOPY, WITH RETROGRADE PYELOGRAM AND URETERAL STENT INSERTION (Right) - CYSTOSCOPY WITH RIGHT RETROGRADE PYELOGRAM, POSSIBLE BIOPSY, POSSIBLE LASER FULGURATION, AND POSSIBLE STENT CYSTOSCOPY, WITH BIOPSY (Right) CYSTOSCOPY, WITH HOLMIUM LASER LITHOTRIPSY (Right) as a surgical intervention.  The patient's history has been reviewed, patient examined, no change in status, stable for surgery.  I have reviewed the patient's chart and labs.  Questions were answered to the patient's satisfaction.     Lonni Righter Noelle Sease

## 2024-08-29 NOTE — Anesthesia Procedure Notes (Signed)
 Procedure Name: LMA Insertion Date/Time: 08/29/2024 7:43 AM  Performed by: Memory Armida LABOR, CRNAPre-anesthesia Checklist: Patient identified, Emergency Drugs available, Suction available, Patient being monitored and Timeout performed Patient Re-evaluated:Patient Re-evaluated prior to induction Oxygen Delivery Method: Circle system utilized Preoxygenation: Pre-oxygenation with 100% oxygen Induction Type: IV induction Ventilation: Mask ventilation without difficulty LMA: LMA with gastric port inserted LMA Size: 4.0 Number of attempts: 1 Placement Confirmation: positive ETCO2 and breath sounds checked- equal and bilateral Tube secured with: Tape Dental Injury: Teeth and Oropharynx as per pre-operative assessment

## 2024-08-29 NOTE — Anesthesia Postprocedure Evaluation (Signed)
"   Anesthesia Post Note  Patient: Maria Marquez  Procedure(s) Performed: CYSTOSCOPY, WITH RETROGRADE PYELOGRAM AND URETERAL STENT INSERTION (Right) CYSTOSCOPY, WITH HOLMIUM LASER FULFURATION (Right)     Patient location during evaluation: PACU Anesthesia Type: General Level of consciousness: awake and alert Pain management: pain level controlled Vital Signs Assessment: post-procedure vital signs reviewed and stable Respiratory status: spontaneous breathing, nonlabored ventilation, respiratory function stable and patient connected to nasal cannula oxygen Cardiovascular status: blood pressure returned to baseline and stable Postop Assessment: no apparent nausea or vomiting Anesthetic complications: no   No notable events documented.  Last Vitals:  Vitals:   08/29/24 1045 08/29/24 1100  BP: 133/68 (!) 142/70  Pulse: 68 64  Resp: 15 13  Temp:    SpO2: 98% 93%    Last Pain:  Vitals:   08/29/24 1100  TempSrc:   PainSc: 0-No pain                 Mesha Schamberger      "

## 2024-08-29 NOTE — Transfer of Care (Signed)
 Immediate Anesthesia Transfer of Care Note  Patient: Inocente LULLA Acton  Procedure(s) Performed: CYSTOSCOPY, WITH RETROGRADE PYELOGRAM AND URETERAL STENT INSERTION (Right) CYSTOSCOPY, WITH HOLMIUM LASER FULFURATION (Right)  Patient Location: PACU  Anesthesia Type:General  Level of Consciousness: awake, alert , oriented, and patient cooperative  Airway & Oxygen Therapy: Patient Spontanous Breathing and Patient connected to face mask oxygen  Post-op Assessment: Report given to RN, Post -op Vital signs reviewed and stable, and Patient moving all extremities  Post vital signs: Reviewed and stable  Last Vitals:  Vitals Value Taken Time  BP 127/87 08/29/24 10:32  Temp    Pulse 69 08/29/24 10:34  Resp 17 08/29/24 10:34  SpO2 99 % 08/29/24 10:34  Vitals shown include unfiled device data.  Last Pain:  Vitals:   08/29/24 0818  TempSrc: Oral  PainSc:          Complications: No notable events documented.

## 2024-08-30 ENCOUNTER — Encounter (HOSPITAL_COMMUNITY): Payer: Self-pay | Admitting: Urology

## 2024-09-03 ENCOUNTER — Encounter: Payer: Self-pay | Admitting: Internal Medicine

## 2024-09-03 ENCOUNTER — Ambulatory Visit: Payer: Self-pay | Admitting: Internal Medicine

## 2024-09-03 DIAGNOSIS — C659 Malignant neoplasm of unspecified renal pelvis: Secondary | ICD-10-CM

## 2024-09-04 MED ORDER — EPINEPHRINE 0.3 MG/0.3ML IJ SOAJ: 0.3000 mg | INTRAMUSCULAR | 3 refills | Status: AC | PRN

## 2024-09-05 ENCOUNTER — Encounter: Payer: Self-pay | Admitting: Internal Medicine

## 2024-09-12 ENCOUNTER — Ambulatory Visit: Payer: Self-pay

## 2024-09-12 NOTE — Telephone Encounter (Signed)
 FYI Only or Action Required?: FYI only for provider: appointment scheduled on 1/22.  Patient was last seen in primary care on 08/07/2024 by Theophilus Andrews, Tully GRADE, MD.  Called Nurse Triage reporting Medication Problem.  Symptoms began a week ago.  Interventions attempted: Nothing.  Symptoms are: stable.  Triage Disposition: No disposition on file.  Patient/caregiver understands and will follow disposition?: Yes     Reason for Triage: Patient Taking  ozempic   except for lowest dose makes my blood glucose drop at night to 59. Had surgery 10 days ago and had to stop ozempic . Have not resumed the injections. Hoping there is something that will help me lose weight without dropping my blood sugar so low.  Maybe a much lower dose, ledt this message Dr theophilus and dr theophilus said for her to be transfer to triage nurse  Reason for Disposition  [1] Caller has NON-URGENT medicine question about med that PCP prescribed AND [2] triager unable to answer question  Answer Assessment - Initial Assessment Questions Caller has been off ozempic  for a week due to procedure. She doesn't want to start back at this high dose due to BS dropping below 60 at night. Current BS 199 while eating. Asymptomatic   1. NAME of MEDICINE: What medicine(s) are you calling about?     ozempic  2. QUESTION: What is your question? (e.g., double dose of medicine, side effect)     Wants to decrease dose or take a different med 3. PRESCRIBER: Who prescribed the medicine? Reason: if prescribed by specialist, call should be referred to that group.      4. SYMPTOMS: Do you have any symptoms? If Yes, ask: What symptoms are you having?  How bad are the symptoms (e.g., mild, moderate, severe)      5. PREGNANCY:  Is there any chance that you are pregnant? When was your last menstrual period?  Protocols used: Medication Question Call-A-AH

## 2024-09-13 ENCOUNTER — Ambulatory Visit (INDEPENDENT_AMBULATORY_CARE_PROVIDER_SITE_OTHER): Admitting: Internal Medicine

## 2024-09-13 ENCOUNTER — Ambulatory Visit: Admitting: Psychology

## 2024-09-13 ENCOUNTER — Encounter: Payer: Self-pay | Admitting: Internal Medicine

## 2024-09-13 VITALS — BP 120/70 | HR 70 | Temp 97.7°F | Wt 273.3 lb

## 2024-09-13 DIAGNOSIS — E1142 Type 2 diabetes mellitus with diabetic polyneuropathy: Secondary | ICD-10-CM | POA: Diagnosis not present

## 2024-09-13 NOTE — Progress Notes (Signed)
 "    Established Patient Office Visit     CC/Reason for Visit: Concerns regarding Ozempic   HPI: Maria Marquez is a 79 y.o. female who is coming in today for the above mentioned reasons. Past Medical History is significant for: Type 2 diabetes.  She has noticed that while on Ozempic  she will have frequent hypoglycemic episodes at nighttime around the mid to high 50s and 60s.  She had to stop the Ozempic  3 weeks ago for surgery and has not taken her last 2 doses.  Her hypoglycemia has resolved.  Her last A1c was 5.6 in December.  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 macel;  due to chemotherapy   Chronic rhinitis    w/ upper airway wheezing due to nasal drainage per pulmonogy note (dr meade),  using stiolto inhaler   Complication of anesthesia    affects memory for a few days   Diabetes mellitus type 2, diet-controlled (HCC)    followed by pcp;    (04-20-2023 pt has started take farxiga  daily;    per pt check blood sugar multiple times daily w/ Libre,  fasting average 120--125   Dyspnea on exertion    04-20-2023 pt stated sob w/ long distancewalk and stairs but recovers quickly when stop/ rest,  ok with household chores and short walks   Family history of breast cancer 05/04/2021   Family history of ovarian cancer 05/04/2021   Generalized weakness    GERD (gastroesophageal reflux disease)    History of basal cell carcinoma (BCC) excision    per pt in 1970s on nose   History of chemotherapy     06-13-2020  to 12/ 2021  for right renal pelvis carcinoma   History of colon polyps    History of kidney stones    History of radiation therapy    11-13-2020 to 11-26-2020---   Right Lung- SBRT- Dr. Lynwood Nasuti   HLD (hyperlipidemia)    Hypertension    followed by pcp   Hypothyroidism, postsurgical 1979   followed by pcp   Leukocytosis    Nocturia    OA (osteoarthritis)    Pneumonia    PONV (postoperative nausea and vomiting)    Since chemo, ponv,   Port-A-Cath in place    Primary adenocarcinoma of upper lobe of right lung [facility]) 09/2020   oncologist--- dr dorsey/  pulmonology-- dr n. meade;  dx 02/ 2022;  s/p  SBRT 11-13-2020 to 11-26-2020   Urgency of urination    wears pads   Varicose veins of both legs with edema    Wears partial dentures    lower    Past Surgical History:  Procedure Laterality Date   COLONOSCOPY  last one 2017   CYSTOSCOPY W/ URETERAL STENT PLACEMENT Right 01/27/2022   Procedure: CYSTOSCOPY WITH RETROGRADE PYELOGRAM/ URETEROSCOPY/POSSIBLE BIOPSY/ POSSIBLE  URETERAL STENT PLACEMENT;  Surgeon: Devere Lonni Righter, MD;  Location: [facility];  Service: Urology;  Laterality:  Right;   CYSTOSCOPY W/ URETERAL STENT PLACEMENT Right 08/29/2024   Procedure: CYSTOSCOPY, WITH RETROGRADE PYELOGRAM AND URETERAL STENT INSERTION;  Surgeon: Devere Lonni Righter, MD;  Location: [facility];  Service: Urology;  Laterality: Right;  CYSTOSCOPY WITH RIGHT RETROGRADE PYELOGRAM, POSSIBLE BIOPSY, POSSIBLE LASER FULGURATION, AND POSSIBLE STENT   CYSTOSCOPY WITH BIOPSY Right 09/16/2021   Procedure: CYSTOSCOPY / URETEROSCOPY WITH BIOPSY/bugbee fulgeration/right retrograde;  Surgeon: Devere Lonni Righter, MD;  Location: [facility];  Service: Urology;  Laterality: Right;   CYSTOSCOPY WITH BIOPSY N/A 11/02/2023   Procedure: CYSTOSCOPY WITH BIOPSY;  Surgeon: Devere Lonni Righter, MD;  Location: [facility];  Service: Urology;  Laterality: N/A;    CYSTOSCOPY WITH BIOPSY Right 03/07/2024   Procedure: CYSTOSCOPY, WITH BIOPSY;  Surgeon: Devere Lonni Righter, MD;  Location: [facility];  Service: Urology;  Laterality: Right;   CYSTOSCOPY WITH HOLMIUM LASER LITHOTRIPSY Right 08/29/2024   Procedure: CYSTOSCOPY, WITH HOLMIUM LASER FULFURATION;  Surgeon: Devere Lonni Righter, MD;  Location: [facility];  Service: Urology;  Laterality: Right;   CYSTOSCOPY WITH RETROGRADE PYELOGRAM, URETEROSCOPY AND STENT PLACEMENT Right 04/27/2023   Procedure: CYSTOSCOPY, WITH RIGHT RETROGRADE PYELOGRAM, RIGHT URETEROSCOPY, RIGHT URETERAL STENT PLACEMENT, FULGURATION OF RIGHT RENAL PELVIS TUMORS;  Surgeon: Devere Lonni Righter, MD;  Location: [facility];  Service: Urology;  Laterality: Right;   CYSTOSCOPY WITH STENT PLACEMENT Right 10/01/2022   Procedure: CYSTOSCOPY WITH STENT PLACEMENT;  Surgeon: Devere Lonni Righter, MD;  Location: [facility];  Service: Urology;  Laterality: Right;   CYSTOSCOPY WITH URETEROSCOPY Right 04/18/2020   Procedure: CYSTOSCOPY WITH URETEROSCOPY, BIOPSY;  Surgeon: Devere Lonni Righter, MD;  Location: [facility];  Service: Urology;  Laterality: Right;  ONLY NEEDS 45 MIN   CYSTOSCOPY WITH URETEROSCOPY Right 06/02/2022   Procedure: CYSTOSCOPY WITH URETEROSCOPY;  Surgeon: Devere Lonni Righter, MD;  Location: [facility];  Service: Urology;  Laterality: Right;  ONLY NEEDS 60 MINS   CYSTOSCOPY WITH URETEROSCOPY AND STENT PLACEMENT N/A 12/31/2020   Procedure: CYSTOSCOPY WITH RIGHT URETEROSCOPY/ BILATERAL RETROGRADE PYELOGRAM/RIGHT URETERAL BIOPSY/ LASER ABLATION/RIGHT STENT PLACEMENT;  Surgeon: Devere Lonni Righter, MD;  Location: [facility];  Service: Urology;  Laterality: N/A;   CYSTOSCOPY/RETROGRADE/URETEROSCOPY Right 04/22/2021   Procedure: CYSTOSCOPY/RETROGRADE/URETEROSCOPY/ STENT PLACEMENT;  Surgeon: Devere Lonni Righter, MD;  Location: [facility];   Service: Urology;  Laterality: Right;   CYSTOSCOPY/URETEROSCOPY/HOLMIUM LASER/STENT PLACEMENT Right 11/02/2023   Procedure: CYSTOSCOPY/RIGHT URETEROSCOPY/RETROGRADE PYELOGRAM/ LASER ABLATION/;  Surgeon: Devere Lonni Righter, MD;  Location: [facility];  Service: Urology;  Laterality: Right;  45 MINUTES NEEDED   CYSTOSCOPY/URETEROSCOPY/HOLMIUM LASER/STENT PLACEMENT Right 03/07/2024   Procedure: CYSTOSCOPY/URETEROSCOPY/HOLMIUM LASER TUMOR ABALTION/STENT PLACEMENT/TURBT;  Surgeon: Devere Lonni Righter, MD;  Location: [facility];  Service: Urology;  Laterality: Right;  CYSTOSCOPY WITH RIGHT RETROGRADE PYELOGRAM, URETEROSCOPY, POSSIBLE BIOPSY WITH LASER FULGURATION, POSSIBLE STENT PLACEMENT   EYE SURGERY Bilateral 1987   tear ducts   IR IMAGING GUIDED PORT INSERTION  06/04/2020   IR NEPHROSTOMY PLACEMENT RIGHT  03/03/2022   THYROID  LOBECTOMY Right 1979   TOTAL HIP ARTHROPLASTY Right 01/28/2016   Procedure: RIGHT TOTAL HIP ARTHROPLASTY ANTERIOR APPROACH;  Surgeon: Dempsey Moan, MD;  Location: [facility];  Service: Orthopedics;  Laterality: Right;   UMBILICAL HERNIA REPAIR  1980s   URETEROSCOPY Right 10/01/2022   Procedure: URETEROSCOPY WITH BIOPSY pylegram;  Surgeon: Devere Lonni Righter, MD;  Location: [facility];  Service: Urology;  Laterality: Right;  45 MINS   VIDEO BRONCHOSCOPY WITH ENDOBRONCHIAL NAVIGATION N/A 06/06/2020  Procedure: VIDEO BRONCHOSCOPY WITH ENDOBRONCHIAL NAVIGATION;  Surgeon: Shelah Lamar RAMAN, MD;  Location: [facility];  Service: Thoracic;  Laterality: N/A;   VIDEO BRONCHOSCOPY WITH ENDOBRONCHIAL NAVIGATION N/A 10/10/2020   Procedure: VIDEO BRONCHOSCOPY WITH ENDOBRONCHIAL NAVIGATION;  Surgeon: Kerrin Elspeth BROCKS, MD;  Location: [facility];  Service: Thoracic;  Laterality: N/A;    Social History:  reports that she quit smoking about 4 years ago. Her smoking use included cigarettes. She started smoking about 49 years ago. She has a 45 pack-year smoking history. She has never  used smokeless tobacco. She reports that she does not currently use alcohol. She reports that she does not use drugs.  Allergies: Allergies[1]  Family History:  Family History  Problem Relation Age of Onset   Arthritis Mother    Breast cancer Mother 34   Schizophrenia Mother    Diabetes Father    Heart disease Father    Kidney disease Father    CAD Father    Stroke Sister    Lung cancer Brother        dx > 50; work-related exposures   Heart disease Brother    Heart disease Brother    Vision loss Brother        Glaucoma   Alcohol abuse Brother    Ovarian cancer Daughter 31   Lung cancer Maternal Aunt        dx > 50   Colon cancer Paternal Aunt        dx > 50   Head & neck cancer Paternal Uncle        dx 34s   Leukemia Cousin        d. 30s   Breast cancer Niece 48    Current Medications[2]  Review of Systems:  Negative unless indicated in HPI.   Physical Exam: Vitals:   09/13/24 1503  BP: 120/70  Pulse: 70  Temp: 97.7 F (36.5 C)  TempSrc: Oral  SpO2: 98%  Weight: 273 lb 4.8 oz (124 kg)    Body mass index is 44.11 kg/m.    Impression and Plan:  Type 2 diabetes mellitus with diabetic polyneuropathy, without long-term current use of insulin  (HCC)  -Okay to stay off Ozempic  for now.  Reassess next A1c after March.   Time spent:22 minutes reviewing chart, interviewing and examining patient and formulating plan of care.     Tully Theophilus Andrews, MD [facility]     [1]  Allergies Allergen Reactions   Iodine Shortness Of Breath and Other (See Comments)   Keflex  [Cephalexin ] Shortness Of Breath, Rash and Tinitus   Shellfish Allergy Hives   Shellfish Protein-Containing Drug Products Anaphylaxis and Hives   Hydrocodone  Hives and Itching    Itching throat   Rosuvastatin  Other (See Comments)    Muscle Pain in Thighs   Ciprofloxacin  Other (See Comments)   Latex Rash   Lisinopril Cough   Sulfa Antibiotics Nausea Only    Tramadol  Other (See Comments)    hallucinations  [2]  Current Outpatient Medications:    acetaminophen  (TYLENOL ) 500 MG tablet, Take 1,500 mg by mouth every 6 (six) hours as needed for moderate pain (pain score 4-6)., Disp: , Rfl:    albuterol  (VENTOLIN  HFA) 108 (90 Base) MCG/ACT inhaler, INHALE ONE OR TWO PUFFS BY MOUTH INTO LUNGS EVERY SIX HOURS AS NEEDED FOR WHEEZING/SHORTNESS OF BREATH, Disp: 8.5 g, Rfl: 2   atorvastatin  (LIPITOR) 20 MG tablet, TAKE 1 TABLET EVERY DAY (NEED APPOINTMENT), Disp: 90 tablet, Rfl:  3   azelastine  (ASTELIN ) 0.1 % nasal spray, Place 1 spray into both nostrils 2 (two) times daily. Use in each nostril as directed (Patient taking differently: Place 1 spray into both nostrils 2 (two) times daily as needed for allergies. Use in each nostril as directed), Disp: 30 mL, Rfl: 11   Azelastine -Fluticasone  137-50 MCG/ACT SUSP, Place 1 spray into the nose in the morning and at bedtime., Disp: 23 g, Rfl: 11   Blood Glucose Monitoring Suppl DEVI, 1 each by Does not apply route in the morning, at noon, and at bedtime. May substitute to any manufacturer covered by patient's insurance., Disp: 1 each, Rfl: 0   Continuous Blood Gluc Receiver (FREESTYLE LIBRE 3 READER) DEVI, 1 each by Does not apply route daily., Disp: 1 each, Rfl: 3   Continuous Glucose Sensor (FREESTYLE LIBRE 3 PLUS SENSOR) MISC, PLACE ONE SENSOR ONTO THE SKIN ONCE EVERY 15 DAYS; REMOVE OLD SENSOR; CHECK GLUCOSE CONTINUOUSLY, Disp: 2 each, Rfl: 11   dapagliflozin  propanediol (FARXIGA ) 10 MG TABS tablet, Take 10 mg by mouth daily., Disp: , Rfl:    EPINEPHrine  0.3 mg/0.3 mL IJ SOAJ injection, Inject 0.3 mg into the muscle as needed for anaphylaxis., Disp: 2 each, Rfl: 3   estradiol -norethindrone  (ACTIVELLA) 1-0.5 MG tablet, TAKE ONE TABLET BY MOUTH ONCE A DAY (Patient taking differently: Take 1 tablet by mouth every other day.), Disp: 28 tablet, Rfl: 3   ezetimibe  (ZETIA ) 10 MG tablet, TAKE 1 TABLET EVERY DAY (NEED MD  APPOINTMENT FOR REFILLS), Disp: 90 tablet, Rfl: 3   fluticasone  (FLONASE ) 50 MCG/ACT nasal spray, Place 1 spray into both nostrils in the morning and at bedtime. (Patient taking differently: Place 1 spray into both nostrils 2 (two) times daily as needed for allergies.), Disp: 16 g, Rfl: 11   furosemide  (LASIX ) 20 MG tablet, Take 20 mg by mouth daily as needed for fluid or edema., Disp: , Rfl:    glucose 4 GM chewable tablet, Chew 1 tablet (4 g total) by mouth as needed for low blood sugar., Disp: 30 tablet, Rfl: 2   levothyroxine  (SYNTHROID ) 150 MCG tablet, Take 1 tablet (150 mcg total) by mouth daily before breakfast., Disp: 90 tablet, Rfl: 1   lidocaine -prilocaine  (EMLA ) cream, Apply 1 Application topically as needed. (Patient taking differently: Apply 1 Application topically daily as needed (prior to port flush).), Disp: 30 g, Rfl: 0   losartan -hydrochlorothiazide  (HYZAAR) 50-12.5 MG tablet, Take 1 tablet by mouth daily., Disp: , Rfl:    oxybutynin  (DITROPAN ) 5 MG tablet, Take 1 tablet (5 mg total) by mouth every 8 (eight) hours as needed for bladder spasms., Disp: 30 tablet, Rfl: 1   pantoprazole  (PROTONIX ) 40 MG tablet, TAKE 1 TABLET EVERY DAY (Patient taking differently: Take 40 mg by mouth every other day.), Disp: 90 tablet, Rfl: 3   Tiotropium Bromide-Olodaterol (STIOLTO RESPIMAT ) 2.5-2.5 MCG/ACT AERS, Inhale 2 puffs into the lungs daily after lunch., Disp: 4 g, Rfl: 11   XIIDRA 5 % SOLN, Place 1 drop into both eyes 2 (two) times daily., Disp: , Rfl:    albuterol  (PROVENTIL ) (2.5 MG/3ML) 0.083% nebulizer solution, Take 3 mLs (2.5 mg total) by nebulization every 6 (six) hours as needed for wheezing or shortness of breath., Disp: 75 mL, Rfl: 12   oxyCODONE -acetaminophen  (PERCOCET) 5-325 MG tablet, Take 1 tablet by mouth every 4 (four) hours as needed for severe pain (pain score 7-10)., Disp: 10 tablet, Rfl: 0   Semaglutide , 1 MG/DOSE, 4 MG/3ML SOPN, Inject 1 mg  as directed once a week., Disp: 3  mL, Rfl: 2  "

## 2024-09-20 ENCOUNTER — Ambulatory Visit: Admitting: Psychology

## 2024-10-18 ENCOUNTER — Ambulatory Visit: Admitting: Psychology

## 2024-10-19 ENCOUNTER — Ambulatory Visit: Payer: Medicare HMO

## 2024-11-05 ENCOUNTER — Ambulatory Visit: Admitting: Internal Medicine

## 2024-11-12 ENCOUNTER — Ambulatory Visit: Admitting: Podiatry

## 2025-01-02 ENCOUNTER — Inpatient Hospital Stay

## 2025-01-02 ENCOUNTER — Inpatient Hospital Stay: Admitting: Hematology and Oncology

## 2025-01-30 ENCOUNTER — Ambulatory Visit
